# Patient Record
Sex: Male | Born: 1945
Health system: Southern US, Community
[De-identification: ages and names within clinical notes are randomized; demographics above are authoritative.]

## PROBLEM LIST (undated history)

## (undated) DIAGNOSIS — Z95810 Presence of automatic (implantable) cardiac defibrillator: Secondary | ICD-10-CM

## (undated) DIAGNOSIS — I472 Ventricular tachycardia, unspecified: Secondary | ICD-10-CM

## (undated) DIAGNOSIS — J449 Chronic obstructive pulmonary disease, unspecified: Secondary | ICD-10-CM

## (undated) DIAGNOSIS — J3089 Other allergic rhinitis: Secondary | ICD-10-CM

## (undated) DIAGNOSIS — I5022 Chronic systolic (congestive) heart failure: Secondary | ICD-10-CM

## (undated) DIAGNOSIS — I4891 Unspecified atrial fibrillation: Secondary | ICD-10-CM

## (undated) DIAGNOSIS — M5020 Other cervical disc displacement, unspecified cervical region: Secondary | ICD-10-CM

## (undated) DIAGNOSIS — G4700 Insomnia, unspecified: Secondary | ICD-10-CM

## (undated) DIAGNOSIS — M1990 Unspecified osteoarthritis, unspecified site: Secondary | ICD-10-CM

## (undated) DIAGNOSIS — K5790 Diverticulosis of intestine, part unspecified, without perforation or abscess without bleeding: Secondary | ICD-10-CM

## (undated) DIAGNOSIS — I252 Old myocardial infarction: Secondary | ICD-10-CM

## (undated) DIAGNOSIS — Z8719 Personal history of other diseases of the digestive system: Secondary | ICD-10-CM

## (undated) DIAGNOSIS — I2510 Atherosclerotic heart disease of native coronary artery without angina pectoris: Secondary | ICD-10-CM

## (undated) DIAGNOSIS — I255 Ischemic cardiomyopathy: Secondary | ICD-10-CM

## (undated) DIAGNOSIS — I10 Essential (primary) hypertension: Secondary | ICD-10-CM

## (undated) DIAGNOSIS — J189 Pneumonia, unspecified organism: Secondary | ICD-10-CM

## (undated) DIAGNOSIS — F419 Anxiety disorder, unspecified: Secondary | ICD-10-CM

## (undated) DIAGNOSIS — J309 Allergic rhinitis, unspecified: Secondary | ICD-10-CM

## (undated) HISTORY — DX: Anxiety disorder, unspecified: F41.9

## (undated) HISTORY — DX: Diverticulosis of intestine, part unspecified, without perforation or abscess without bleeding: K57.90

## (undated) HISTORY — PX: TONSILLECTOMY AND ADENOIDECTOMY: SUR1326

## (undated) HISTORY — DX: Chronic obstructive pulmonary disease, unspecified: J44.9

## (undated) HISTORY — DX: Other allergic rhinitis: J30.89

## (undated) HISTORY — DX: Unspecified atrial fibrillation: I48.91

## (undated) HISTORY — PX: SHOULDER ARTHROSCOPY W/ ROTATOR CUFF REPAIR: SHX2400

## (undated) HISTORY — DX: Other cervical disc displacement, unspecified cervical region: M50.20

## (undated) HISTORY — DX: Unspecified osteoarthritis, unspecified site: M19.90

## (undated) HISTORY — DX: Insomnia, unspecified: G47.00

## (undated) HISTORY — DX: Allergic rhinitis, unspecified: J30.9

---

## 1993-02-04 DIAGNOSIS — I252 Old myocardial infarction: Secondary | ICD-10-CM

## 1993-02-04 HISTORY — DX: Old myocardial infarction: I25.2

## 1993-02-04 HISTORY — PX: CORONARY ANGIOPLASTY: SHX604

## 1998-12-23 ENCOUNTER — Emergency Department (HOSPITAL_COMMUNITY): Admission: EM | Admit: 1998-12-23 | Discharge: 1998-12-23 | Payer: Self-pay | Admitting: Emergency Medicine

## 2001-04-01 ENCOUNTER — Encounter: Payer: Self-pay | Admitting: Orthopedic Surgery

## 2001-04-01 ENCOUNTER — Ambulatory Visit (HOSPITAL_COMMUNITY): Admission: RE | Admit: 2001-04-01 | Discharge: 2001-04-01 | Payer: Self-pay | Admitting: Orthopedic Surgery

## 2002-02-04 HISTORY — PX: INSERT / REPLACE / REMOVE PACEMAKER: SUR710

## 2002-02-04 HISTORY — PX: CARDIAC CATHETERIZATION: SHX172

## 2002-02-04 HISTORY — PX: CARDIAC DEFIBRILLATOR PLACEMENT: SHX171

## 2002-07-21 ENCOUNTER — Ambulatory Visit (HOSPITAL_COMMUNITY): Admission: RE | Admit: 2002-07-21 | Discharge: 2002-07-22 | Payer: Self-pay | Admitting: Cardiovascular Disease

## 2002-08-26 ENCOUNTER — Ambulatory Visit (HOSPITAL_COMMUNITY): Admission: RE | Admit: 2002-08-26 | Discharge: 2002-08-27 | Payer: Self-pay | Admitting: Internal Medicine

## 2002-08-27 ENCOUNTER — Encounter: Payer: Self-pay | Admitting: Internal Medicine

## 2003-05-06 ENCOUNTER — Ambulatory Visit (HOSPITAL_COMMUNITY): Admission: RE | Admit: 2003-05-06 | Discharge: 2003-05-06 | Payer: Self-pay | Admitting: Orthopedic Surgery

## 2003-05-06 HISTORY — PX: KNEE ARTHROSCOPY: SHX127

## 2003-12-19 ENCOUNTER — Encounter: Admission: RE | Admit: 2003-12-19 | Discharge: 2003-12-19 | Payer: Self-pay | Admitting: Specialist

## 2004-02-03 ENCOUNTER — Encounter: Admission: RE | Admit: 2004-02-03 | Discharge: 2004-02-03 | Payer: Self-pay | Admitting: Internal Medicine

## 2004-03-19 ENCOUNTER — Ambulatory Visit: Payer: Self-pay | Admitting: Internal Medicine

## 2004-04-20 ENCOUNTER — Inpatient Hospital Stay (HOSPITAL_COMMUNITY): Admission: RE | Admit: 2004-04-20 | Discharge: 2004-04-21 | Payer: Self-pay | Admitting: Specialist

## 2004-05-21 ENCOUNTER — Encounter: Admission: RE | Admit: 2004-05-21 | Discharge: 2004-05-21 | Payer: Self-pay | Admitting: Specialist

## 2004-05-28 ENCOUNTER — Encounter: Admission: RE | Admit: 2004-05-28 | Discharge: 2004-05-28 | Payer: Self-pay | Admitting: Specialist

## 2005-06-26 ENCOUNTER — Ambulatory Visit: Payer: Self-pay | Admitting: Internal Medicine

## 2006-08-12 ENCOUNTER — Ambulatory Visit: Payer: Self-pay | Admitting: Internal Medicine

## 2007-08-04 ENCOUNTER — Ambulatory Visit: Payer: Self-pay | Admitting: Internal Medicine

## 2007-11-05 ENCOUNTER — Ambulatory Visit: Payer: Self-pay | Admitting: Internal Medicine

## 2008-02-08 ENCOUNTER — Ambulatory Visit: Payer: Self-pay | Admitting: Internal Medicine

## 2008-05-03 ENCOUNTER — Encounter (INDEPENDENT_AMBULATORY_CARE_PROVIDER_SITE_OTHER): Payer: Self-pay | Admitting: *Deleted

## 2008-05-30 ENCOUNTER — Ambulatory Visit: Payer: Self-pay | Admitting: Internal Medicine

## 2008-05-30 ENCOUNTER — Telehealth (INDEPENDENT_AMBULATORY_CARE_PROVIDER_SITE_OTHER): Payer: Self-pay | Admitting: *Deleted

## 2008-06-14 ENCOUNTER — Encounter: Payer: Self-pay | Admitting: Internal Medicine

## 2008-06-20 ENCOUNTER — Ambulatory Visit: Payer: Self-pay | Admitting: Internal Medicine

## 2008-06-21 ENCOUNTER — Ambulatory Visit: Payer: Self-pay | Admitting: Family Medicine

## 2008-06-27 ENCOUNTER — Telehealth: Payer: Self-pay | Admitting: Family Medicine

## 2008-07-01 ENCOUNTER — Telehealth: Payer: Self-pay | Admitting: Internal Medicine

## 2008-07-13 ENCOUNTER — Encounter (INDEPENDENT_AMBULATORY_CARE_PROVIDER_SITE_OTHER): Payer: Self-pay | Admitting: *Deleted

## 2008-07-14 ENCOUNTER — Encounter: Payer: Self-pay | Admitting: Family Medicine

## 2008-07-15 ENCOUNTER — Telehealth: Payer: Self-pay | Admitting: Family Medicine

## 2008-07-15 ENCOUNTER — Ambulatory Visit: Payer: Self-pay | Admitting: Family Medicine

## 2008-07-18 ENCOUNTER — Telehealth: Payer: Self-pay | Admitting: Family Medicine

## 2008-07-28 ENCOUNTER — Ambulatory Visit: Payer: Self-pay | Admitting: Family Medicine

## 2008-07-28 ENCOUNTER — Telehealth: Payer: Self-pay | Admitting: Family Medicine

## 2008-07-29 LAB — CONVERTED CEMR LAB
CO2: 27 meq/L (ref 19–32)
Chloride: 106 meq/L (ref 96–112)
Potassium: 3.9 meq/L (ref 3.5–5.1)

## 2008-08-01 ENCOUNTER — Telehealth: Payer: Self-pay | Admitting: Internal Medicine

## 2008-08-01 ENCOUNTER — Telehealth: Payer: Self-pay | Admitting: Family Medicine

## 2008-08-02 ENCOUNTER — Ambulatory Visit: Payer: Self-pay | Admitting: Internal Medicine

## 2008-08-22 ENCOUNTER — Ambulatory Visit: Payer: Self-pay | Admitting: Internal Medicine

## 2008-08-25 ENCOUNTER — Ambulatory Visit: Payer: Self-pay | Admitting: Family Medicine

## 2008-08-25 DIAGNOSIS — J984 Other disorders of lung: Secondary | ICD-10-CM

## 2008-08-25 DIAGNOSIS — J309 Allergic rhinitis, unspecified: Secondary | ICD-10-CM | POA: Insufficient documentation

## 2008-08-25 DIAGNOSIS — T462X5A Adverse effect of other antidysrhythmic drugs, initial encounter: Secondary | ICD-10-CM

## 2008-08-25 DIAGNOSIS — E785 Hyperlipidemia, unspecified: Secondary | ICD-10-CM

## 2008-09-06 ENCOUNTER — Telehealth: Payer: Self-pay | Admitting: Family Medicine

## 2008-10-24 ENCOUNTER — Telehealth: Payer: Self-pay | Admitting: Family Medicine

## 2008-10-31 DIAGNOSIS — I2510 Atherosclerotic heart disease of native coronary artery without angina pectoris: Secondary | ICD-10-CM

## 2009-01-16 ENCOUNTER — Telehealth: Payer: Self-pay | Admitting: Family Medicine

## 2009-02-06 ENCOUNTER — Telehealth: Payer: Self-pay | Admitting: Family Medicine

## 2009-02-13 ENCOUNTER — Telehealth: Payer: Self-pay | Admitting: Family Medicine

## 2009-02-16 ENCOUNTER — Encounter: Payer: Self-pay | Admitting: Family Medicine

## 2009-02-21 ENCOUNTER — Telehealth: Payer: Self-pay | Admitting: Family Medicine

## 2009-02-23 ENCOUNTER — Encounter: Payer: Self-pay | Admitting: Family Medicine

## 2009-04-28 ENCOUNTER — Telehealth: Payer: Self-pay | Admitting: Family Medicine

## 2009-06-05 ENCOUNTER — Telehealth: Payer: Self-pay | Admitting: Family Medicine

## 2009-06-15 ENCOUNTER — Telehealth: Payer: Self-pay | Admitting: Family Medicine

## 2009-07-06 ENCOUNTER — Ambulatory Visit: Payer: Self-pay | Admitting: Family Medicine

## 2009-07-14 ENCOUNTER — Telehealth: Payer: Self-pay | Admitting: Family Medicine

## 2009-07-21 ENCOUNTER — Telehealth: Payer: Self-pay | Admitting: Family Medicine

## 2009-08-16 ENCOUNTER — Ambulatory Visit: Payer: Self-pay | Admitting: Family Medicine

## 2009-08-22 ENCOUNTER — Telehealth: Payer: Self-pay | Admitting: Family Medicine

## 2009-08-23 ENCOUNTER — Telehealth: Payer: Self-pay | Admitting: Family Medicine

## 2009-08-23 ENCOUNTER — Ambulatory Visit: Payer: Self-pay | Admitting: Internal Medicine

## 2009-08-23 ENCOUNTER — Ambulatory Visit: Payer: Self-pay | Admitting: Family Medicine

## 2009-08-23 DIAGNOSIS — Z95810 Presence of automatic (implantable) cardiac defibrillator: Secondary | ICD-10-CM | POA: Insufficient documentation

## 2009-08-24 ENCOUNTER — Encounter (INDEPENDENT_AMBULATORY_CARE_PROVIDER_SITE_OTHER): Payer: Self-pay | Admitting: *Deleted

## 2009-08-24 LAB — CONVERTED CEMR LAB
AST: 15 units/L (ref 0–37)
Albumin: 3.6 g/dL (ref 3.5–5.2)
BUN: 16 mg/dL (ref 6–23)
Basophils Absolute: 0.1 10*3/uL (ref 0.0–0.1)
CO2: 27 meq/L (ref 19–32)
Cholesterol: 183 mg/dL (ref 0–200)
Eosinophils Absolute: 0.3 10*3/uL (ref 0.0–0.7)
Glucose, Bld: 74 mg/dL (ref 70–99)
HCT: 41 % (ref 39.0–52.0)
Hemoglobin: 13.8 g/dL (ref 13.0–17.0)
Lymphs Abs: 2.3 10*3/uL (ref 0.7–4.0)
MCHC: 33.5 g/dL (ref 30.0–36.0)
MCV: 90.1 fL (ref 78.0–100.0)
Monocytes Absolute: 0.6 10*3/uL (ref 0.1–1.0)
Monocytes Relative: 7.1 % (ref 3.0–12.0)
Neutro Abs: 5.5 10*3/uL (ref 1.4–7.7)
Platelets: 260 10*3/uL (ref 150.0–400.0)
Potassium: 3.8 meq/L (ref 3.5–5.1)
RDW: 14.7 % — ABNORMAL HIGH (ref 11.5–14.6)
Sodium: 138 meq/L (ref 135–145)
Triglycerides: 228 mg/dL — ABNORMAL HIGH (ref 0.0–149.0)
VLDL: 45.6 mg/dL — ABNORMAL HIGH (ref 0.0–40.0)

## 2009-08-25 ENCOUNTER — Telehealth: Payer: Self-pay | Admitting: Family Medicine

## 2009-08-30 ENCOUNTER — Telehealth: Payer: Self-pay | Admitting: Internal Medicine

## 2009-08-31 ENCOUNTER — Ambulatory Visit: Payer: Self-pay | Admitting: Family Medicine

## 2009-09-11 ENCOUNTER — Ambulatory Visit: Payer: Self-pay | Admitting: Family Medicine

## 2009-09-13 ENCOUNTER — Telehealth: Payer: Self-pay | Admitting: Family Medicine

## 2009-09-18 ENCOUNTER — Telehealth: Payer: Self-pay | Admitting: Family Medicine

## 2009-10-16 ENCOUNTER — Telehealth: Payer: Self-pay | Admitting: Family Medicine

## 2009-11-20 ENCOUNTER — Telehealth: Payer: Self-pay | Admitting: Family Medicine

## 2009-11-30 ENCOUNTER — Ambulatory Visit: Payer: Self-pay | Admitting: Family Medicine

## 2009-12-01 ENCOUNTER — Encounter: Payer: Self-pay | Admitting: Internal Medicine

## 2009-12-12 ENCOUNTER — Encounter: Payer: Self-pay | Admitting: Family Medicine

## 2009-12-14 ENCOUNTER — Telehealth: Payer: Self-pay | Admitting: Family Medicine

## 2009-12-18 ENCOUNTER — Encounter: Payer: Self-pay | Admitting: Family Medicine

## 2009-12-19 ENCOUNTER — Ambulatory Visit: Payer: Self-pay | Admitting: Family Medicine

## 2009-12-25 ENCOUNTER — Telehealth: Payer: Self-pay | Admitting: Family Medicine

## 2010-01-04 ENCOUNTER — Ambulatory Visit: Payer: Self-pay | Admitting: Family Medicine

## 2010-01-08 ENCOUNTER — Encounter: Admission: RE | Admit: 2010-01-08 | Discharge: 2010-01-08 | Payer: Self-pay | Admitting: Family Medicine

## 2010-01-11 ENCOUNTER — Encounter: Payer: Self-pay | Admitting: Internal Medicine

## 2010-01-11 ENCOUNTER — Encounter: Payer: Self-pay | Admitting: Family Medicine

## 2010-01-22 ENCOUNTER — Ambulatory Visit: Payer: Self-pay | Admitting: Family Medicine

## 2010-01-23 ENCOUNTER — Telehealth (INDEPENDENT_AMBULATORY_CARE_PROVIDER_SITE_OTHER): Payer: Self-pay | Admitting: *Deleted

## 2010-01-24 ENCOUNTER — Ambulatory Visit: Payer: Self-pay | Admitting: Family Medicine

## 2010-01-24 ENCOUNTER — Telehealth (INDEPENDENT_AMBULATORY_CARE_PROVIDER_SITE_OTHER): Payer: Self-pay | Admitting: *Deleted

## 2010-01-25 ENCOUNTER — Ambulatory Visit: Payer: Self-pay | Admitting: Family Medicine

## 2010-01-25 DIAGNOSIS — R300 Dysuria: Secondary | ICD-10-CM | POA: Insufficient documentation

## 2010-01-25 DIAGNOSIS — R310 Gross hematuria: Secondary | ICD-10-CM

## 2010-01-25 LAB — CONVERTED CEMR LAB
Glucose, Urine, Semiquant: NEGATIVE
Protein, U semiquant: 30
Urobilinogen, UA: 0.2
WBC Urine, dipstick: NEGATIVE

## 2010-01-30 ENCOUNTER — Telehealth: Payer: Self-pay | Admitting: Family Medicine

## 2010-02-04 HISTORY — PX: LAPAROSCOPIC CHOLECYSTECTOMY: SUR755

## 2010-02-06 ENCOUNTER — Encounter (INDEPENDENT_AMBULATORY_CARE_PROVIDER_SITE_OTHER): Payer: Self-pay | Admitting: *Deleted

## 2010-02-07 ENCOUNTER — Encounter: Payer: Self-pay | Admitting: Internal Medicine

## 2010-02-07 ENCOUNTER — Ambulatory Visit
Admission: RE | Admit: 2010-02-07 | Discharge: 2010-02-07 | Payer: Self-pay | Source: Home / Self Care | Attending: Internal Medicine | Admitting: Internal Medicine

## 2010-02-08 ENCOUNTER — Encounter: Payer: Self-pay | Admitting: Internal Medicine

## 2010-02-09 ENCOUNTER — Encounter: Payer: Self-pay | Admitting: Internal Medicine

## 2010-02-09 ENCOUNTER — Ambulatory Visit
Admission: RE | Admit: 2010-02-09 | Discharge: 2010-02-09 | Payer: Self-pay | Source: Home / Self Care | Attending: Internal Medicine | Admitting: Internal Medicine

## 2010-02-09 ENCOUNTER — Telehealth (INDEPENDENT_AMBULATORY_CARE_PROVIDER_SITE_OTHER): Payer: Self-pay | Admitting: *Deleted

## 2010-02-14 ENCOUNTER — Encounter (INDEPENDENT_AMBULATORY_CARE_PROVIDER_SITE_OTHER): Payer: Self-pay | Admitting: *Deleted

## 2010-02-21 ENCOUNTER — Ambulatory Visit (HOSPITAL_COMMUNITY)
Admission: RE | Admit: 2010-02-21 | Discharge: 2010-02-21 | Payer: Self-pay | Source: Home / Self Care | Attending: Surgery | Admitting: Surgery

## 2010-02-21 LAB — COMPREHENSIVE METABOLIC PANEL
ALT: 8 U/L (ref 0–53)
AST: 19 U/L (ref 0–37)
Albumin: 3.4 g/dL — ABNORMAL LOW (ref 3.5–5.2)
Alkaline Phosphatase: 59 U/L (ref 39–117)
BUN: 14 mg/dL (ref 6–23)
CO2: 27 mEq/L (ref 19–32)
Calcium: 9.2 mg/dL (ref 8.4–10.5)
Chloride: 110 mEq/L (ref 96–112)
Creatinine, Ser: 1.06 mg/dL (ref 0.4–1.5)
GFR calc Af Amer: >60 mL/min (ref >60)
GFR calc non Af Amer: >60 mL/min (ref >60)
Glucose, Bld: 93 mg/dL (ref 70–99)
Potassium: 4.8 mEq/L (ref 3.5–5.1)
Sodium: 145 mEq/L (ref 135–145)
Total Bilirubin: 0.5 mg/dL (ref 0.3–1.2)
Total Protein: 5.9 g/dL — ABNORMAL LOW (ref 6.0–8.3)

## 2010-02-21 LAB — SURGICAL PCR SCREEN
MRSA, PCR: NEGATIVE
Staphylococcus aureus: NEGATIVE

## 2010-02-21 LAB — CBC
HCT: 37.6 % — ABNORMAL LOW (ref 39.0–52.0)
Hemoglobin: 12.4 g/dL — ABNORMAL LOW (ref 13.0–17.0)
MCH: 29.8 pg (ref 26.0–34.0)
MCHC: 33 g/dL (ref 30.0–36.0)
MCV: 90.4 fL (ref 78.0–100.0)
Platelets: 295 10*3/uL (ref 150–400)
RBC: 4.16 MIL/uL — ABNORMAL LOW (ref 4.22–5.81)
RDW: 14.9 % (ref 11.5–15.5)
WBC: 10.3 10*3/uL (ref 4.0–10.5)

## 2010-02-23 NOTE — Op Note (Signed)
  [name], [name]              ACCOUNT NO.:  [id number]  MEDICAL RECORD NO.:  [id number]          PATIENT TYPE:  AMB  LOCATION:  SDS                          FACILITY:  [facility]  PHYSICIAN:  Abigail Miyamoto, M.D. DATE OF BIRTH:  Mar 14, 1945  DATE OF PROCEDURE:  02/21/2010 DATE OF DISCHARGE:  02/21/2010                              OPERATIVE REPORT   DATE OF PROCEDURE:  February 21, 2010.  PREOPERATIVE DIAGNOSIS:  Symptomatic cholelithiasis.  POSTOPERATIVE DIAGNOSIS:  Symptomatic cholelithiasis.  PROCEDURE:  Laparoscopic cholecystectomy.  SURGEON:  Abigail Miyamoto, MD  ANESTHESIA:  General and 0.5% Marcaine.  ESTIMATED BLOOD LOSS:  Minimal.  FINDINGS:  The patient was found to have chronically scarred-appearing gallbladder with gallstones.  PROCEDURE IN DETAIL:  The patient was brought to the operating room and identified as [name].  He was placed supine on the operating table and general anesthesia was induced.  His abdomen was prepped and draped in the usual sterile fashion.  Using a 15 blade, a small vertical incision was made below the umbilicus.  This was carried down to the fascia which was then opened with a scalpel.  A hemostat was then used to pass through the peritoneal cavity under direct vision.  Next, a 0- Vicryl pursestring suture was placed around the fascial opening.  The Hasson port was placed through the opening and insufflation of the abdomen was begun.  A 5-mm port was then placed in the patient's epigastrium and two more were placed in the right upper quadrant, all under direct vision.  The gallbladder was then grasped and tracked above the liver bed.  Several adhesions to the gallbladder were then taken down bluntly.  The cystic duct was then dissected out and a critical window was achieved around.  It was clipped three times proximally, once distally, and then transected.  The cystic artery was then identified and clipped three times  proximally and once distally and transected as well.  The gallbladder was then slowly dissected free from liver bed with electrocautery.  Once this was free from liver bed, hemostasis was achieved with the cautery.  The gallbladder was then removed through the incision at the umbilicus.  I then irrigated the abdomen with normal saline.  With 0-Vicryl umbilicus was then tied in place closing the fascial defect.  All ports were removed under direct vision, and the abdomen was deflated.  All incisions were anesthetized with Marcaine and closed with 4-0 Monocryl subcuticular sutures.  Steri-Strips and Band-Aids were then applied.  The patient tolerated the procedure well.  All sponge, needle, and instrument counts were correct at the end of the procedure.  The patient was then extubated in the operating room and taken in stable condition to recovery room.     Abigail Miyamoto, M.D.     DB/MEDQ  D:  02/21/2010  T:  02/22/2010  Job:  [id number]  Electronically Signed by Abigail Miyamoto M.D. on 02/23/2010 11:03:42 AM

## 2010-02-25 ENCOUNTER — Encounter: Payer: Self-pay | Admitting: Specialist

## 2010-02-26 ENCOUNTER — Encounter: Payer: Self-pay | Admitting: Internal Medicine

## 2010-02-28 ENCOUNTER — Telehealth (INDEPENDENT_AMBULATORY_CARE_PROVIDER_SITE_OTHER): Payer: Self-pay | Admitting: *Deleted

## 2010-03-08 NOTE — Progress Notes (Signed)
Summary: Rx Ambien  Phone Note Refill Request Call back at [phone] Message [facility]:  [facility] on Jun 05, 2009 8:13 AM  Refills Requested: Medication #1:  ZOLPIDEM TARTRATE 10 MG TABS 1 by mouth at bedtime as needed insomnia   Last Refilled: 05/02/2009 Received faxed refill request please advise.   Method Requested: Telephone to Pharmacy Initial call taken by: Linde Gillis CMA Duncan Dull),  Jun 05, 2009 8:13 AM  Follow-up for Phone Call        [facility] request Follow-up by: Benny Lennert CMA Duncan Dull),  Jun 05, 2009 9:16 AM    Prescriptions: ZOLPIDEM TARTRATE 10 MG TABS (ZOLPIDEM TARTRATE) 1 by mouth at bedtime as needed insomnia  #30 x 4   Entered and Authorized by:   Hannah Beat MD   Signed by:   Hannah Beat MD on 06/05/2009   Method used:   Telephoned to ...       [facility] [address] (retail)       [phone]       [place], [place]  [address]       Ph: [phone]       Fax: [phone]   RxID:   [id number]

## 2010-03-08 NOTE — Progress Notes (Signed)
Summary: refill request for vicodin  Phone Note Refill Request Message from:  Fax from Pharmacy  Refills Requested: Medication #1:  HYDROCODONE-ACETAMINOPHEN 5-500 MG TABS Take 1 tab four times a day as needed for pain   Last Refilled: 08/23/2009 [facility]axed request from [facility] [address], [phone].  Initial call taken by: Lowella Petties CMA,  September 13, 2009 10:02 AM  Follow-up for Phone Call        Rx called to pharmacy Follow-up by: Benny Lennert CMA Duncan Dull),  September 13, 2009 11:48 AM    Prescriptions: HYDROCODONE-ACETAMINOPHEN 5-500 MG TABS (HYDROCODONE-ACETAMINOPHEN) Take 1 tab four times a day as needed for pain  #50 x 3   Entered and Authorized by:   Hannah Beat MD   Signed by:   Hannah Beat MD on 09/13/2009   Method used:   Telephoned to ...       [facility]  [place] (retail)       [address]       [place], [place]  [address]       Ph: [phone] or [phone]       Fax: [phone]   RxID:   [id number]

## 2010-03-08 NOTE — Progress Notes (Signed)
Summary: refill request for tramadol  Phone Note Refill Request Message from:  Fax from Pharmacy  Refills Requested: Medication #1:  TRAMADOL HCL 50 MG TABS TAke 1 tab four times a day as needed for pain   Last Refilled: 06/15/2009 Faxed request from [facility] [address], [phone].   Initial call taken by: Lowella Petties CMA,  August 25, 2009 8:25 AM  Follow-up for Phone Call        sent to wrong pharm yest Follow-up by: Hannah Beat MD,  August 25, 2009 8:28 AM    Prescriptions: TRAMADOL HCL 50 MG TABS (TRAMADOL HCL) TAke 1 tab four times a day as needed for pain  #60 x 5   Entered and Authorized by:   Hannah Beat MD   Signed by:   Hannah Beat MD on 08/25/2009   Method used:   [facility]        [facility]  [place] (retail)       [address]       [place], [place]  [address]       Ph: [phone] or [phone]       Fax: [phone]   RxID:   [id number]

## 2010-03-08 NOTE — Progress Notes (Signed)
Summary: wants antibiotic cream for his nose  Phone Note Call from Patient Call back at [phone]   Caller: Patient Call For: Hannah Beat MD Summary of Call: Pt was seen last week for allergic reaction.  He says he has sores in his nose that are draining.  He was told to use neosporin but that isnt helping.  He is asking for an antibiotic or steroid cream to [facility].  He says this is causing him a lot of problems and he needs to get this cleared up. Initial call taken by: Lowella Petties CMA,  August 22, 2009 8:30 AM  Follow-up for Phone Call        noted  mupiricin ointment, 4 times daily to each nostril, 1 month supply tube, #1, 0 refills Follow-up by: Hannah Beat MD,  August 22, 2009 8:39 AM  Additional Follow-up for Phone Call Additional follow up Details #1::        Phone Call Completed Additional Follow-up by: Benny Lennert CMA Duncan Dull),  August 22, 2009 8:47 AM    New/Updated Medications: MUPIROCIN 2 % OINT (MUPIROCIN) apply 4 times daily to effected area Prescriptions: MUPIROCIN 2 % OINT (MUPIROCIN) apply 4 times daily to effected area  #1 x 0   Entered by:   Benny Lennert CMA (AAMA)   Authorized by:   Hannah Beat MD   Signed by:   Benny Lennert CMA (AAMA) on 08/22/2009   Method used:   [facility]        [facility] (retail)       [address]       [place], [place]  [address]       Ph: [phone] or [phone]       Fax: [phone]   RxID:   [id number]

## 2010-03-08 NOTE — Assessment & Plan Note (Signed)
Summary: reaction from perfume/dlo   Vital Signs:  Patient profile:   65 year old male Height:      63 inches Weight:      170.8 pounds BMI:     30.37 Temp:     98.1 degrees F oral Pulse rate:   70 / minute Pulse rhythm:   regular BP sitting:   130 / 80  (left arm) Cuff size:   regular  Vitals Entered By: Benny Lennert CMA Duncan Dull) (August 31, 2009 12:09 PM)  History of Present Illness: Chief complaint reaction from perfume  65 year old male:  Went to eat at [facility] and came bak over and the woman came by and feels like her sinuses  he has had recurrent problems perfumes in the past, and some of them will  break out his nose,  start a series of sinus congestion and making cough and had to use an inhaler.  He also got myopathies with his statin medication and would like to stop this. He is interested about maximizing his risk of decreasing his risk for post  secondary MIs and a Faylene Million, although he is  unable to tolerate or take a statin medication. He has multiple questions about this.  Allergies: 1)  ! Codeine 2)  ! Penicillin 3)  ! * Statin Intolerance  Past History:  Past medical, surgical, family and social histories (including risk factors) reviewed, and no changes noted (except as noted below).  Past Medical History: MI/ 1995 (ICD-410.90) VENTRICULAR TACHYCARDIA (ICD-427.1) ATRIAL FIBRILLATION WITH RAPID VENTRICULAR RESPONSE (ICD-427.31) PACEMAKER, PERMANENT (ICD-V45.01) CHF/ CLASS I-II (ICD-428.0)  (26% EF), has AICD CAD, UNSPECIFIED SITE (ICD-414.00) CARDIOMYOPATHY, ISCHEMIC (ICD-414.8) HYPERLIPIDEMIAConnie intolerance to statins. Myopathies. COPD, MILD (ICD-496) DYSPHAGIA (ICD-787.29) ALLERGIC RHINITIS (ICD-477.9)   Cards = Sharrell Ku GI = Leone Payor  Past Surgical History: Reviewed history from 10/31/2008 and no changes required. Defibrillator-AICD (Medtronic) PTCA-Stent 1998 RTC repair, R Spirometry: 08/25/2008: FEV1% = 64, FEV1 = 89 % predicted  Family  History: Reviewed history from 08/22/2008 and no changes required. Family History of Diabetes: Father No FH of Colon Cancer:  Social History: Reviewed history from 06/21/2008 and no changes required. Occupation: Retired, Presenter, broadcasting Patient currently smokes. (150 + PACK YEARS) Alcohol Use - no Daily Caffeine Use -1 Illicit Drug Use - no Married, no children  Review of Systems      See HPI General:  Denies chills and fever. CV:  Denies swelling of feet and swelling of hands. Resp:  Complains of cough, shortness of breath, and sputum productive.  Physical Exam  General:  Well-developed,well-nourished,in no acute distress; alert,appropriate and cooperative throughout examination Head:  Normocephalic and atraumatic without obvious abnormalities. No apparent alopecia or balding. Ears:  no external deformities.   Nose:  no external deformity.   Mouth:  Oral mucosa and oropharynx without lesions or exudates.  Teeth in good repair. Neck:  No deformities, masses, or tenderness noted. Lungs:  normal respiratory effort, no intercostal retractions, and no accessory muscle use.   some decreased BS with rare wheezing Heart:  Regular rate and rhythm; no murmurs, rubs,  or bruits. Extremities:  No clubbing, cyanosis, edema, or deformity noted with normal full range of motion of all joints.   Neurologic:  alert & oriented X3 and gait normal.   Psych:  Cognition and judgment appear intact. Alert and cooperative with normal attention span and concentration. No apparent delusions, illusions, hallucinations   Impression & Recommendations:  Problem # 1:  HYPERLIPIDEMIA (ICD-272.4) like to  stop his statin medication. We discussed some of the research and potential benefit and post MI patient's taking niacin,, fainted this is very reasonable. We'll start Niaspan slowly and then recheck after several months.  The following medications were removed from the medication list:    Crestor 5 Mg Tabs  (Rosuvastatin calcium) .[name]... Take one tablet daily His updated medication list for this problem includes:    Zetia 10 Mg Tabs (Ezetimibe) ..... Once daily    Niaspan 500 Mg Cr-tabs (Niacin (antihyperlipidemic)) .[name]... 1 by mouth at bedtime    Niaspan 1000 Mg Cr-tabs (Niacin (antihyperlipidemic)) .[name]... 1 by mouth at bedtime  Problem # 2:  MI/ 1995 (ICD-410.90)  His updated medication list for this problem includes:    Carvedilol 12.5 Mg Tabs (Carvedilol) .[name]... Take 1 tab two times a day    Aspirin Ec 325 Mg Tbec (Aspirin) ..... Once daily    Lisinopril 10 Mg Tabs (Lisinopril) ..... Once daily    Nitrostat 0.4 Mg Subl (Nitroglycerin) .[name]... 1 by mouth as needed as direcyed    Cozaar 100 Mg Tabs (Losartan potassium) .[name]..[name] Two times a day  Problem # 3:  ATRIAL FIBRILLATION WITH RAPID VENTRICULAR RESPONSE (ICD-427.31)  His updated medication list for this problem includes:    Carvedilol 12.5 Mg Tabs (Carvedilol) .[name]... Take 1 tab two times a day    Aspirin Ec 325 Mg Tbec (Aspirin) ..... Once daily  Problem # 4:  COPD, MILD (ICD-496) thank you this for some of his cough is coming from, but he is opposed to going on any more  daily medication at this point but  His updated medication list for this problem includes:    Proair Hfa 108 (90 Base) Mcg/act Aers (Albuterol sulfate) .[name]... 2 inh q4h as needed shortness of breath  Complete Medication List: 1)  Clotrimazole 10 Mg Troc (Clotrimazole) .... As needed 2)  Celebrex 200 Mg Caps (Celecoxib) .... Take 1 capsule as needed for pain 3)  Carvedilol 12.5 Mg Tabs (Carvedilol) .... Take 1 tab two times a day 4)  Aspirin Ec 325 Mg Tbec (Aspirin) .... Once daily 5)  Lisinopril 10 Mg Tabs (Lisinopril) .... Once daily 6)  Hydrocodone-acetaminophen 5-500 Mg Tabs (Hydrocodone-acetaminophen) .... Take 1 tab four times a day as needed for pain 7)  Benzonatate 200 Mg Caps (Benzonatate) .... Take 1 tab three times a day as needed 8)  Nitrostat 0.4 Mg Subl  (Nitroglycerin) .[name].. 1 by mouth as needed as direcyed 9)  Zetia 10 Mg Tabs (Ezetimibe) .... Once daily 10)  Cozaar 100 Mg Tabs (Losartan potassium) .... Two times a day 11)  Diazepam 5 Mg Tabs (Diazepam) .... Take 1 tab up to three times a day daily as needed 12)  Skelaxin 800 Mg Tabs (Metaxalone) .[name].. 1 by mouth three times a day as needed muscle spasm / back pain 13)  Tramadol Hcl 50 Mg Tabs (Tramadol hcl) .... Take 1 tab four times a day as needed for pain 14)  Zolpidem Tartrate 10 Mg Tabs (Zolpidem tartrate) .[name].. 1 by mouth at bedtime as needed insomnia 15)  Promethazine Hcl 25 Mg Tabs (Promethazine hcl) .... As needed for nausea 16)  Proair Hfa 108 (90 Base) Mcg/act Aers (Albuterol sulfate) .... 2 inh q4h as needed shortness of breath 17)  Niaspan 500 Mg Cr-tabs (Niacin (antihyperlipidemic)) .[name].. 1 by mouth at bedtime 18)  Niaspan 1000 Mg Cr-tabs (Niacin (antihyperlipidemic)) .[name].. 1 by mouth at bedtime  Patient Instructions: 1)  f/u 3 month 2)  FLP: 272.4 3)  HFP: v58.69 4)  TAKE NIACIN 500 MG 1 TABLET A DAY AT NIGHT BEFORE SLEEPING. TAKE YOUR ASPIRIN BEFORE THIS. 5)  AFTER , INCREASE TO THE 1000 MG PRESCRIPTION TAKEN AT NIGHT, THEN RECHECK LABS Prescriptions: NIASPAN 500 MG CR-TABS (NIACIN (ANTIHYPERLIPIDEMIC)) 1 by mouth at bedtime  #30 x 0   Entered and Authorized by:   Hannah Beat MD   Signed by:   Hannah Beat MD on 08/31/2009   Method used:   [facility]        [facility] ([facility]ail)       [address]       [place], [place]  [address]       Ph: [phone] or [phone]       Fax: [phone]   RxID:   [id number] NIASPAN 1000 MG CR-TABS (NIACIN (ANTIHYPERLIPIDEMIC)) 1 by mouth at bedtime  #30 x 3   Entered and Authorized by:   Hannah Beat MD   Signed by:   Hannah Beat MD on 08/31/2009   Method used:   Print then Give to Patient   RxID:   [id number] NIASPAN 500 MG CR-TABS (NIACIN (ANTIHYPERLIPIDEMIC)) 1 by mouth at  bedtime  #30 x 0   Entered and Authorized by:   Hannah Beat MD   Signed by:   Hannah Beat MD on 08/31/2009   Method used:   Electronically to        [facility] [address] (retail)       [phone]       [place], [place]  [address]       Ph: [phone]       Fax: [phone]   RxID:   [id number] PROAIR HFA 108 (90 BASE) MCG/ACT  AERS (ALBUTEROL SULFATE) 2 inh q4h as needed shortness of breath  #1 x 2   Entered and Authorized by:   Hannah Beat MD   Signed by:   Hannah Beat MD on 08/31/2009   Method used:   Print then Give to Patient   RxID:   [id number]   Current Allergies (reviewed today): ! CODEINE ! PENICILLIN ! * STATIN INTOLERANCE

## 2010-03-08 NOTE — Letter (Signed)
Summary: Remote Device Check  [facility], Main Office  [address]   [place], [place] [address]   Phone: [phone]  Fax: [phone]     February 14, 2010 MRN: [id number]   [name] [address] [place], [place]  [address]   Dear [name],   Your remote transmission was recieved and reviewed by your physician.  All diagnostics were within normal limits for you.   __X____Your next office visit is scheduled for:  April 2012 with Dr [name]. Please call our office to schedule an appointment.    Sincerely,  Vella Kohler

## 2010-03-08 NOTE — Consult Note (Signed)
Summary: [facility]  [facility]   Imported By: Lanelle Bal 01/20/2010 09:59:06  _____________________________________________________________________  External Attachment:    Type:   Image     Comment:   External Document  Appended Document: [facility] vasomotor rhinitis and prob reversible _ Gene [place]

## 2010-03-08 NOTE — Progress Notes (Signed)
Summary: [name]  Phone Note Refill Request Message from:  Fax from Pharmacy on February 28, 2010 3:29 PM  Refills Requested: Medication #1:  ZOLPIDEM TARTRATE 10 MG TABS 1 by mouth at bedtime as needed insomnia [facility] [phone]  Initial call taken by: Melody Comas,  February 28, 2010 3:31 PM  Follow-up for Phone Call        Rx called to pharmacy Follow-up by: Benny Lennert CMA Duncan Dull),  March 01, 2010 8:46 AM    Prescriptions: ZOLPIDEM TARTRATE 10 MG TABS (ZOLPIDEM TARTRATE) 1 by mouth at bedtime as needed insomnia  #30 x 4   Entered and Authorized by:   Hannah Beat MD   Signed by:   Hannah Beat MD on 02/28/2010   Method used:   Telephoned to ...       [facility]  [place] (retail)       [address]       [place], [place]  [address]       Ph: [phone] or [phone]       Fax: [phone]   RxID:   [id number]

## 2010-03-08 NOTE — Progress Notes (Signed)
Summary: pt needs asap  Phone Note Refill Request Message from:  [facility] [place] [facility] [phone]  Refills Requested: Medication #1:  NITROGLYCERIN 0.4 MG/HR PT24 as needed Initial call taken by: Omer Jack,  August 30, 2009 3:54 PM  Follow-up for Phone Call        RX SENT IN ON 08/23/09. LMOM FOR PT TO CALL BACK TO CONFIRM PILLS/PATCHES. Marrion Coy, CNA  August 30, 2009 4:00 PM  Follow-up by: Marrion Coy, CNA,  August 30, 2009 4:00 PM  Additional Follow-up for Phone Call Additional follow up Details #1::        pt states he takes pills, will send in new rx Marrion Coy, CNA  August 30, 2009 4:04 PM  Additional Follow-up by: Marrion Coy, CNA,  August 30, 2009 4:04 PM    New/Updated Medications: NITROSTAT 0.4 MG SUBL (NITROGLYCERIN) 1 by mouth as needed as direcyed Prescriptions: NITROSTAT 0.4 MG SUBL (NITROGLYCERIN) 1 by mouth as needed as direcyed  #25 x 6   Entered by:   Marrion Coy, CNA   Authorized by:   Laren Boom, MD, [facility]   Signed by:   Marrion Coy, CNA on 08/30/2009   Method used:   Electronically to        [facility]  [place] (retail)       [address]       [place], [place]  [address]       Ph: [phone] or [phone]       Fax: [phone]   RxID:   [id number]

## 2010-03-08 NOTE — Letter (Signed)
Summary: ok for surgery  [facility], Main Office  [address]   [place], [place] [address]   Phone: [phone]  Fax: [phone]    02/07/2010  TO: Leodis Sias IT MAY CONCERN   RE: [name] [address] [place]   The above named individual is under my medical care and may proceed with surgery.  He is at low cardiac risk.  He has a defibrillator and may have a magnet placed over his device during surgery and removed after.  If you have any further questions or need additional information, please call.     Sincerely,    Dr. Lewayne Bunting, MD,FACC Dennis Bast, RN, BSN

## 2010-03-08 NOTE — Progress Notes (Signed)
Summary: wants refill on prednisone  Phone Note Call from Patient Call back at Home Phone [phone]   Caller: Patient Summary of Call: Pt states he has an allergy to perfume and Dr. Patsy Lager gave him prednisone last time.  He is asking for a refill on that [facility].  He also wants a referral to a respiratory specialist to see if he can have help with this problem.  He prefers to see someone in [place]. Initial call taken by: Lowella Petties CMA,  November 20, 2009 8:09 AM  Follow-up for Phone Call        I will refill prednsione x1 , but for referral will await return of Dr. Patsy Lager and his recommendations. Tell pt please  and call prescription to pharm of choice. Follow-up by: Kerby Nora MD,  November 20, 2009 9:36 AM  Additional Follow-up for Phone Call Additional follow up Details #1::        patient advised and rx sent to phramacy.Consuello Masse CMA      Prescriptions: PREDNISONE 20 MG TABS (PREDNISONE) 2 by mouth x 5 days, then 1 by mouth x 3 days  #13 x 0   Entered by:   Benny Lennert CMA (AAMA)   Authorized by:   Kerby Nora MD   Signed by:   Benny Lennert CMA (AAMA) on 11/20/2009   Method used:   [facility]        [facility]  [place] [facility] ([facility]ail)       [address]       [place], [place]  [address]       Ph: [phone] or [phone]       Fax: [phone]   RxID:   [id number] PREDNISONE 20 MG TABS (PREDNISONE) 2 by mouth x 5 days, then 1 by mouth x 3 days  #13 x 0   Entered and Authorized by:   Kerby Nora MD   Signed by:   Kerby Nora MD on 11/20/2009   Method used:   Telephoned to ...       [facility] [address] (retail)       [phone]       [place], [place]  [address]       Ph: [phone]       Fax: [phone]   RxID:   [id number]

## 2010-03-08 NOTE — Assessment & Plan Note (Signed)
Summary: PAIN/DLO   Vital Signs:  Patient profile:   65 year old male Height:      63 inches Weight:      172.75 pounds BMI:     30.71 O2 Sat:      96 % on Room air Temp:     97.9 degrees F oral Pulse rate:   88 / minute Pulse rhythm:   regular Resp:     16 per minute BP sitting:   162 / 100  (left arm) Cuff size:   large  Vitals Entered By: Delilah Shan CMA Duncan Dull) (December 19, 2009 2:56 PM)  O2 Flow:  Room air CC: Pain in rib area from coughing, congestion.   History of Present Illness: No help for nasal sx with astepro.    Exposure to certain scents--> drainage, burning, sore throat.  Lower posterior bilateral rib pain, started last week with cough.  Coughing, minimal sputum that is clear.  Inc in wheeze.  Voice change noted by patient.  No fevers.  Tramadol/hydrocodone used for pain with some relief.    Allergies: 1)  ! Codeine 2)  ! Penicillin 3)  ! * Statin Intolerance  Review of Systems       See HPI.  Otherwise negative.    Physical Exam  General:  GEN: nad, alert and oriented HEENT: mucous membranes moist NECK: supple w/o LA CV: rrr.  PULM: ctab, no inc wob but posterior lower ribs tender to palpation and with cough, tenderness decreased with compression during cough ABD: soft, +bs EXT: no edema SKIN: no acute rash    Impression & Recommendations:  Problem # 1:  RIB PAIN (ICD-786.50) Likey an allergic source with cough that led to rib pain.  Rib pain better with compression.  At this point, I see no indication for antibiotics.  I would increase the prednisone and then see if that improves the cough (and subsequent pain).  Fu as needed.  he agrees.   Complete Medication List: 1)  Clotrimazole 10 Mg Troc (Clotrimazole) .... As needed 2)  Celebrex 200 Mg Caps (Celecoxib) .... Take 1 capsule as needed for pain 3)  Carvedilol 12.5 Mg Tabs (Carvedilol) .... Take 1 tab two times a day 4)  Aspirin Ec 325 Mg Tbec (Aspirin) .... Once daily 5)   Hydrocodone-acetaminophen 5-500 Mg Tabs (Hydrocodone-acetaminophen) .... Take 1 tab four times a day as needed for pain 6)  Benzonatate 200 Mg Caps (Benzonatate) .... Take 1 tab three times a day as needed 7)  Nitrostat 0.4 Mg Subl (Nitroglycerin) .[name].. 1 by mouth as needed as direcyed 8)  Zetia 10 Mg Tabs (Ezetimibe) .... Once daily 9)  Cozaar 100 Mg Tabs (Losartan potassium) .... Two times a day 10)  Diazepam 5 Mg Tabs (Diazepam) .... Take 1 tab up to three times a day daily as needed 11)  Skelaxin 800 Mg Tabs (Metaxalone) .[name].. 1 by mouth three times a day as needed muscle spasm / back pain 12)  Tramadol Hcl 50 Mg Tabs (Tramadol hcl) .... Take 1 tab four times a day as needed for pain 13)  Zolpidem Tartrate 10 Mg Tabs (Zolpidem tartrate) .[name].. 1 by mouth at bedtime as needed insomnia 14)  Promethazine Hcl 25 Mg Tabs (Promethazine hcl) .... As needed for nausea 15)  Proair Hfa 108 (90 Base) Mcg/act Aers (Albuterol sulfate) .... 2 inh q4h as needed shortness of breath 16)  Lisinopril 10 Mg Tabs (Lisinopril) .... Take 1 tab by mouth daily 17)  Prednisone 10  Mg Tabs (Prednisone) .... Take 1 tablet by mouth once a day 18)  Prednisone 20 Mg Tabs (Prednisone) .... 2 by mouth x3 days,  1 by mouth x3 days,  1/2 by mouth x4 days,  Patient Instructions: 1)  I would use the taper of prednisone  for now.  Take it with food.  If you get a fever, worse symptoms, or discolored sputum, let us know.   Take care.  Take the tramadol for the rib pain.  Prescriptions: PREDNISONE 20 MG TABS (PREDNISONE) 2 by mouth x3 days,  1 by mouth x3 days,  1/2 by mouth x4 days,  #11 x 0   Entered and Authorized by:   Crawford Givens MD   Signed by:   Crawford Givens MD on 12/19/2009   Method used:   [facility]        [facility] [id number]       [address]       [place], [place]  [address]       Ph: [phone] or [phone]       Fax: [phone]   RxID:   [id number]    Orders Added: 1)   Est. Patient Level III [66063]    Current Allergies (reviewed today): ! CODEINE ! PENICILLIN ! * STATIN INTOLERANCE

## 2010-03-08 NOTE — Progress Notes (Signed)
Summary: Prior authorization for Zolpidem  Phone Note From Pharmacy Call back at [phone]   [facility]er[facility] Call For: Dr. Patsy Lager  Summary of Call: Received faxed form for prior authorization for Zolpidem Tartrate 10mg .  Called [phone] to get the forms faxed.  Spoke with Enrique Sack.  Medication back dated and approved from 12/11/2008 through 12/11/2009.  Initial call taken by: Linde Gillis CMA Duncan Dull),  February 21, 2009 3:42 PM     Appended Document: Prior authorization for Zolpidem Pharmacist advised, Rx was processed.  Appended Document: Prior authorization for Zolpidem Received fax from [facility], prior authorization request has been approved for Ambien 10mg .  Authorization valid from 12/11/2008 through 12/11/2009.

## 2010-03-08 NOTE — Letter (Signed)
Summary: [facility]   Imported By: Maryln Gottron 02/06/2010 15:33:49  _____________________________________________________________________  External Attachment:    Type:   Image     Comment:   External Document  Appended Document: [facility] elective chole, cardiac clearance p

## 2010-03-08 NOTE — Progress Notes (Signed)
Summary: wants abx  Phone Note Call from Patient Call back at [phone]   Caller: Patient Call For: Dr. Para March Summary of Call: Pt was seen on 11/15 for cough.  He is now coughing up yellow brown mucous and has a low grade fever of around 99.  He is asking that an abx be [facility]. Initial call taken by: Lowella Petties CMA, AAMA,  December 25, 2009 12:38 PM  Follow-up for Phone Call        sent.  have patient follow up if short of breath or progressively worse.  Follow-up by: Crawford Givens MD,  December 25, 2009 12:44 PM  Additional Follow-up for Phone Call Additional follow up Details #1::        Patient Advised.  Additional Follow-up by: Delilah Shan CMA Duncan Dull),  December 25, 2009 3:38 PM    New/Updated Medications: ZITHROMAX 250 MG TABS (AZITHROMYCIN) 2po today and then 1 by mouth once daily for 4 days. Prescriptions: ZITHROMAX 250 MG TABS (AZITHROMYCIN) 2po today and then 1 by mouth once daily for 4 days.  #6 x 0   Entered and Authorized by:   Crawford Givens MD   Signed by:   Crawford Givens MD on 12/25/2009   Method used:   [facility]        [facility] [facility]       [address]       [place], [place]  [address]       Ph: [phone] or [phone]       Fax: [phone]   RxID:   [id number]

## 2010-03-08 NOTE — Assessment & Plan Note (Signed)
Summary: surgical clearance/gall bladder surgery/jml   Visit Type:  Follow-up Referring Provider:  n/a Primary Provider:  Dr Dallas Schimke   History of Present Illness: [name] is referred back today by Dr. Rayburn Ma for surgical clearance for Cholecystectomy.  He has an ICM, Class 2 CHF and atrial fib.  He has refused coumadin in the past and only takes a full strength ASA.  He has no syncope, c/p, or sob. He has had no ICD shocks. A strategy of rate control has been chosen for his atrial fib as he has not been willing to take coumadin.  He has RUQ pain associated with his gall stones.  Current Medications (verified): 1)  Clotrimazole 10 Mg Troc (Clotrimazole) .... As Needed 2)  Celebrex 200 Mg Caps (Celecoxib) .... Take 1 Capsule As Needed For Pain 3)  Carvedilol 12.5 Mg Tabs (Carvedilol) .... Take 1 Tab Two Times A Day 4)  Aspirin Ec 325 Mg Tbec (Aspirin) .... Once Daily 5)  Hydrocodone-Acetaminophen 5-500 Mg Tabs (Hydrocodone-Acetaminophen) .... Take 1 Tab Four Times A Day As Needed For Pain 6)  Nitrostat 0.4 Mg Subl (Nitroglycerin) .[name].. 1 By Mouth As Needed As Direcyed 7)  Zetia 10 Mg Tabs (Ezetimibe) .... Once Daily 8)  Cozaar 100 Mg Tabs (Losartan Potassium) .... Two Times A Day 9)  Diazepam 5 Mg Tabs (Diazepam) .... Take 1 Tab Up To Three Times A Day Daily As Needed 10)  Skelaxin 800 Mg Tabs (Metaxalone) .[name].. 1 By Mouth Three Times A Day As Needed Muscle Spasm / Back Pain 11)  Tramadol Hcl 50 Mg Tabs (Tramadol Hcl) .... Take 1 Tab Four Times A Day As Needed For Pain 12)  Zolpidem Tartrate 10 Mg Tabs (Zolpidem Tartrate) .[name].. 1 By Mouth At Bedtime As Needed Insomnia 13)  Promethazine Hcl 25 Mg Tabs (Promethazine Hcl) .[name].. 1 By Mouth Every 8 Hours As Needed For Nausea 14)  Ventolin Hfa 108 (90 Base) Mcg/act Aers (Albuterol Sulfate) .[name].. 1-2 Puffs Q4h As Needed For Cough, Wheeze (200 Dose Mdi) 15)  Lisinopril 10 Mg  Tabs (Lisinopril) .... Take 1 Tab By Mouth Daily 16)  Tessalon 200 Mg  Caps (Benzonatate) .[name].. 1 By Mouth Three Times A Day As Needed For Cough  Allergies: 1)  ! Codeine 2)  ! Penicillin 3)  ! * Statin Intolerance  Past History:  Past Medical History: Last updated: 11/30/2009 MI/ 1995 (ICD-410.90) VENTRICULAR TACHYCARDIA (ICD-427.1) ATRIAL FIBRILLATION WITH RAPID VENTRICULAR RESPONSE (ICD-427.31) PACEMAKER, PERMANENT (ICD-V45.01) CHF/ CLASS I-II (ICD-428.0)  (26% EF), has AICD CAD, UNSPECIFIED SITE (ICD-414.00) CARDIOMYOPATHY, ISCHEMIC (ICD-414.8) HYPERLIPIDEMIAConnie intolerance to statins. Myopathies. COPD, MILD (ICD-496) DYSPHAGIA (ICD-787.29) ALLERGIC RHINITIS (ICD-477.9)  Past Surgical History: Last updated: 10/31/2008 Defibrillator-AICD (Medtronic) PTCA-Stent 1998 RTC repair, R Spirometry: 08/25/2008: FEV1% = 64, FEV1 = 89 % predicted  Review of Systems  The patient denies chest pain, syncope, dyspnea on exertion, and peripheral edema.    Vital Signs:  Patient profile:   65 year old male Height:      63 inches Weight:      168 pounds BMI:     29.87 Pulse rate:   51 / minute BP sitting:   121 / 77  (left arm)  Vitals Entered By: Laurance Flatten CMA (February 07, 2010 3:23 PM)  Physical Exam  General:  GEN: nad, alert and oriented HEENT: mucous membranes moist, TM w/o erythema, nasal epithelium injected, OP with cobblestoning NECK: supple w/o LA CV: rrr. PULM: ctab except for occ mild wheeze, no inc wob  ABD: soft, +bs EXT: no edema  max sinus minimally tender to palpation bilaterally    ICD Specifications Following MD:  Lewayne Bunting, MD     ICD Vendor:  Medtronic     ICD Model Number:  7232     ICD Serial Number:  [id number] ICD DOI:  08/26/2002     ICD Implanting MD:  Lewayne Bunting, MD  Lead 1:    Location: RV     DOI: 08/26/2002     Model #: [id number]     Serial #: [id number]     Status: active  Indications::  ICM, VT   Brady Parameters Mode VVI     Lower Rate Limit:  40      Tachy Zones VF:  200     VT:  182-200       Impression & Recommendations:  Problem # 1:  PREOPERATIVE EXAMINATION (ICD-V72.84) He is low risk for cardiovascular complications from pending Gall bladder removal. I have recommended he proceed.   Problem # 2:  AUTOMATIC IMPLANTABLE CARDIAC DEFIBRILLATOR SITU (ICD-V45.02) His device is working normally. Will recheck in several months.  Problem # 3:  ATRIAL FIBRILLATION WITH RAPID VENTRICULAR RESPONSE (ICD-427.31)  I continue to recommende coumadin but he refuses.  After his surgery, will plan to discuss trying Pradaxa instead of ASA. His updated medication list for this problem includes:    Carvedilol 12.5 Mg Tabs (Carvedilol) .[name]... Take 1 tab two times a day    Aspirin Ec 325 Mg Tbec (Aspirin) ..... Once daily  His updated medication list for this problem includes:    Carvedilol 12.5 Mg Tabs (Carvedilol) .[name]... Take 1 tab two times a day    Aspirin Ec 325 Mg Tbec (Aspirin) ..... Once daily  Patient Instructions: 1)  Your physician recommends that you schedule a follow-up appointment in: 3months with Dr [name]

## 2010-03-08 NOTE — Letter (Signed)
Summary: Device-Delinquent Check  [facility], Main Office  [address]   [place], [place] [address]   Phone: [phone]  Fax: [phone]     February 06, 2010 MRN: [id number]   [name] [address] [place], [place]  [address]   Dear Mr. Patnaude,  According to our records, you have not had your implanted device checked in the recommended period of time.  We are unable to determine appropriate device function without checking your device on a regular basis.  Please call our office to schedule an appointment , with Dr Audree Camel soon as possible.  If you are having your device checked by another physician, please call us so that we may update our records.  Thank you,   [facility]

## 2010-03-08 NOTE — Miscellaneous (Signed)
Summary: Controlled Substance Agreement  Controlled Substance Agreement   Imported By: Lanelle Bal 07/13/2009 09:02:55  _____________________________________________________________________  External Attachment:    Type:   Image     Comment:   External Document

## 2010-03-08 NOTE — Cardiovascular Report (Signed)
Summary: Office Visit Remote   Office Visit Remote   Imported By: Roderic Ovens 02/23/2010 11:03:56  _____________________________________________________________________  External Attachment:    Type:   Image     Comment:   External Document

## 2010-03-08 NOTE — Medication Information (Signed)
Summary: Approval for Ambien/BCBS  Approval for Ambien/BCBS   Imported By: Lanelle Bal 03/02/2009 11:21:51  _____________________________________________________________________  External Attachment:    Type:   Image     Comment:   External Document

## 2010-03-08 NOTE — Progress Notes (Signed)
Summary: refill request for skelaxin  Phone Note Refill Request Message from:  Fax from Pharmacy  Refills Requested: Medication #1:  SKELAXIN 800 MG TABS three times a day   Last Refilled: 02/14/2009 [facility] [address]  Initial call taken by: Lowella Petties CMA,  July 21, 2009 10:44 AM  Follow-up for Phone Call        Controlled sustance.Marland Kitchenawait Dr. Brayton Layman return on Monday.  Follow-up by: Kerby Nora MD,  July 21, 2009 11:10 AM  Additional Follow-up for Phone Call Additional follow up Details #1::        Lmom machine advising patient that rx would be refilled on monday when dr Ryhanna Dunsmore returns.Consuello Masse CMA   Additional Follow-up by: Benny Lennert CMA Duncan Dull),  July 21, 2009 12:26 PM    New/Updated Medications: SKELAXIN 800 MG TABS (METAXALONE) 1 by mouth three times a day as needed muscle spasm / back pain Prescriptions: SKELAXIN 800 MG TABS (METAXALONE) 1 by mouth three times a day as needed muscle spasm / back pain  #50 x 5   Entered and Authorized by:   Hannah Beat MD   Signed by:   Hannah Beat MD on 07/23/2009   Method used:   [facility]        [facility] [facility]       [address]       [place], [place]  [address]       Ph: [phone] or [phone]       Fax: [phone]   RxID:   [id number]

## 2010-03-08 NOTE — Assessment & Plan Note (Signed)
Summary: ?REFERRAL/CLE   Vital Signs:  Patient profile:   65 year old male Height:      63 inches Weight:      171 pounds BMI:     30.40 Temp:     98.1 degrees F oral Pulse rate:   80 / minute Pulse rhythm:   regular BP sitting:   122 / 82  (left arm) Cuff size:   regular  Vitals Entered By: Lewanda Rife LPN (November 30, 2009 3:44 PM) CC: ? referral When smells perfume it bothers pt.   History of Present Illness: 65 year old male:  Chief complaint allergic reaction to perfume  65 year old male: pleasant gentleman, long-standing heavy smoker, who has had repetitive allergic symptoms do to perfume exposure. They have been to the degree where he has required oral prednisone to call the symptoms down  DOI 09/11/2009, approx 11/13/2009, multiple others  Prior OV: Chief complaint reaction from perfume  65 year old male:  Went to eat at [facility] and came bak over and the woman came by and feels like her sinuses  he has had recurrent problems perfumes in the past, and some of them will  break out his nose,  start a series of sinus congestion and making cough and had to use an inhaler.  Allergies: 1)  ! Codeine 2)  ! Penicillin 3)  ! * Statin Intolerance  Past History:  Past medical, surgical, family and social histories (including risk factors) reviewed, and no changes noted (except as noted below).  Past Medical History: MI/ 1995 (ICD-410.90) VENTRICULAR TACHYCARDIA (ICD-427.1) ATRIAL FIBRILLATION WITH RAPID VENTRICULAR RESPONSE (ICD-427.31) PACEMAKER, PERMANENT (ICD-V45.01) CHF/ CLASS I-II (ICD-428.0)  (26% EF), has AICD CAD, UNSPECIFIED SITE (ICD-414.00) CARDIOMYOPATHY, ISCHEMIC (ICD-414.8) HYPERLIPIDEMIAConnie intolerance to statins. Myopathies. COPD, MILD (ICD-496) DYSPHAGIA (ICD-787.29) ALLERGIC RHINITIS (ICD-477.9)  Past Surgical History: Reviewed history from 10/31/2008 and no changes required. Defibrillator-AICD (Medtronic) PTCA-Stent 1998 RTC repair,  R Spirometry: 08/25/2008: FEV1% = 64, FEV1 = 89 % predicted  Past History:  Care Management: Cardiology: Dr. [name] Gastroenterology: Dr. Leone Payor  Family History: Reviewed history from 08/22/2008 and no changes required. Family History of Diabetes: Father No FH of Colon Cancer:  Social History: Reviewed history from 06/21/2008 and no changes required. Occupation: Retired, Presenter, broadcasting Patient currently smokes. (150 + PACK YEARS) Alcohol Use - no Daily Caffeine Use -1 Illicit Drug Use - no Married, no children  Review of Systems      See HPI General:  Denies chills, fatigue, and fever. Resp:  See HPI.  Physical Exam  General:  Well-developed,well-nourished,in no acute distress; alert,appropriate and cooperative throughout examination Head:  normocephalic and atraumatic.   Lungs:  normal respiratory effort, no intercostal retractions, and no accessory muscle use.   some decreased BS but no wheezing or crackles Heart:  Regular rate and rhythm; no murmurs, rubs,  or bruits.   Impression & Recommendations:  Problem # 1:  ALLERGIC REACTION (ICD-995.3) persistent, repetitive allergic reaction, particularly to perfume. Consult allergy for their opinion and recommendations.  The patient does have some COPD, and also is a long-standing very heavy smoker.  Orders: Allergy Referral  (Allergy)  Problem # 2:  WHEEZING (ICD-786.07)  Problem # 3:  COPD, MILD (ICD-496)  His updated medication list for this problem includes:    Proair Hfa 108 (90 Base) Mcg/act Aers (Albuterol sulfate) .[name]... 2 inh q4h as needed shortness of breath  Complete Medication List: 1)  Clotrimazole 10 Mg Troc (Clotrimazole) .... As needed  2)  Celebrex 200 Mg Caps (Celecoxib) .... Take 1 capsule as needed for pain 3)  Carvedilol 12.5 Mg Tabs (Carvedilol) .... Take 1 tab two times a day 4)  Aspirin Ec 325 Mg Tbec (Aspirin) .... Once daily 5)  Hydrocodone-acetaminophen 5-500 Mg Tabs  (Hydrocodone-acetaminophen) .... Take 1 tab four times a day as needed for pain 6)  Benzonatate 200 Mg Caps (Benzonatate) .... Take 1 tab three times a day as needed 7)  Nitrostat 0.4 Mg Subl (Nitroglycerin) .[name].. 1 by mouth as needed as direcyed 8)  Zetia 10 Mg Tabs (Ezetimibe) .... Once daily 9)  Cozaar 100 Mg Tabs (Losartan potassium) .... Two times a day 10)  Diazepam 5 Mg Tabs (Diazepam) .... Take 1 tab up to three times a day daily as needed 11)  Skelaxin 800 Mg Tabs (Metaxalone) .[name].. 1 by mouth three times a day as needed muscle spasm / back pain 12)  Tramadol Hcl 50 Mg Tabs (Tramadol hcl) .... Take 1 tab four times a day as needed for pain 13)  Zolpidem Tartrate 10 Mg Tabs (Zolpidem tartrate) .[name].. 1 by mouth at bedtime as needed insomnia 14)  Promethazine Hcl 25 Mg Tabs (Promethazine hcl) .... As needed for nausea 15)  Proair Hfa 108 (90 Base) Mcg/act Aers (Albuterol sulfate) .... 2 inh q4h as needed shortness of breath 16)  Niaspan 500 Mg Cr-tabs (Niacin (antihyperlipidemic)) .[name].. 1 by mouth at bedtime 17)  Niaspan 1000 Mg Cr-tabs (Niacin (antihyperlipidemic)) .[name].. 1 by mouth at bedtime 18)  Lisinopril 10 Mg Tabs (Lisinopril) .... Take 1 tab by mouth daily 19)  Prednisone 20 Mg Tabs (Prednisone) .... 2 by mouth x 5 days, then 1 by mouth x 3 days  Patient Instructions: 1)  Referral Appointment Information 2)  Day/Date: 3)  Time: 4)  Place/MD: 5)  Address: 6)  Phone/Fax: 7)  Patient given appointment information. Information/Orders faxed/mailed.    Orders Added: 1)  Allergy Referral  [Allergy] 2)  Est. Patient Level III [[address]]    Current Allergies (reviewed today): ! CODEINE ! PENICILLIN ! * STATIN INTOLERANCE

## 2010-03-08 NOTE — Progress Notes (Signed)
Summary: Rx Zolpidem  Phone Note Refill Request Call back at [phone] [facility] on February 21, 2009 10:21 AM  Refills Requested: Medication #1:  ZOLPIDEM TARTRATE 10 MG TABS 1 by mouth at bedtime as needed insomnia   Last Refilled: 01/16/2009 Received faxed refill request, please advise   Method Requested: Electronic Initial call taken by: Linde Gillis CMA Duncan Dull),  February 21, 2009 10:22 AM  Follow-up for Phone Call        Controlled substance.Marland Kitchenawait refill per Dr. Patsy Lager on Thurs.  Follow-up by: Kerby Nora MD,  February 21, 2009 10:29 AM  Additional Follow-up for Phone Call Additional follow up Details #1::        He has an open prescription at [facility] noted below. Call them and cancel that prescription if he wants it at [facility].  Cannot have 2 scripts of this out at once.   Call in to pharm [facility] choice, but [facility], then call [facility] and cancel his current script Additional Follow-up by: Hannah Beat MD,  February 21, 2009 1:14 PM    Additional Follow-up for Phone Call Additional follow up Details #2::    rx cancelled at [facility] and called in new at [facility] Follow-up by: Benny Lennert CMA Duncan Dull),  February 21, 2009 2:33 PM  Prescriptions: ZOLPIDEM TARTRATE 10 MG TABS (ZOLPIDEM TARTRATE) 1 by mouth at bedtime as needed insomnia  #30 x 2   Entered and Authorized by:   Hannah Beat MD   Signed by:   Hannah Beat MD on 02/21/2009   Method used:   Telephoned to ...       [facility] [address] (retail)       [phone]       [place], [place]  [address]       Ph: [phone]       Fax: [phone]   RxID:   [id number]

## 2010-03-08 NOTE — Assessment & Plan Note (Signed)
Summary: allergic reaction to perfume/dlo   Vital Signs:  Patient profile:   65 year old male Height:      63 inches Weight:      172.8 pounds BMI:     30.72 Temp:     97.7 degrees F oral Pulse rate:   70 / minute Pulse rhythm:   regular BP sitting:   130 / 80  (left arm) Cuff size:   regular  Vitals Entered By: Benny Lennert CMA Duncan Dull) (September 11, 2009 11:17 AM)  History of Present Illness: Chief complaint allergic reaction to perfume  65 year old male:  Now feels like he is having some popping and gurgling sounds and will cough and clear for a little while. Sometimes will cough until cartilage hurts.  Nonproductive and any sputum. Does have a history of some mild COPD.  Chief complaint reaction from perfume  65 year old male:  Went to eat at [facility] and came bak over and the woman came by and feels like her sinuses  he has had recurrent problems perfumes in the past, and some of them will  break out his nose,  start a series of sinus congestion and making cough and had to use an inhaler.  Allergies: 1)  ! Codeine 2)  ! Penicillin 3)  ! * Statin Intolerance  Past History:  Past medical, surgical, family and social histories (including risk factors) reviewed, and no changes noted (except as noted below).  Past Medical History: Reviewed history from 08/31/2009 and no changes required. MI/ 1995 (ICD-410.90) VENTRICULAR TACHYCARDIA (ICD-427.1) ATRIAL FIBRILLATION WITH RAPID VENTRICULAR RESPONSE (ICD-427.31) PACEMAKER, PERMANENT (ICD-V45.01) CHF/ CLASS I-II (ICD-428.0)  (26% EF), has AICD CAD, UNSPECIFIED SITE (ICD-414.00) CARDIOMYOPATHY, ISCHEMIC (ICD-414.8) HYPERLIPIDEMIAConnie intolerance to statins. Myopathies. COPD, MILD (ICD-496) DYSPHAGIA (ICD-787.29) ALLERGIC RHINITIS (ICD-477.9)   Cards = Sharrell Ku GI = Leone Payor  Past Surgical History: Reviewed history from 10/31/2008 and no changes required. Defibrillator-AICD (Medtronic) PTCA-Stent 1998 RTC repair,  R Spirometry: 08/25/2008: FEV1% = 64, FEV1 = 89 % predicted  Family History: Reviewed history from 08/22/2008 and no changes required. Family History of Diabetes: Father No FH of Colon Cancer:  Social History: Reviewed history from 06/21/2008 and no changes required. Occupation: Retired, Presenter, broadcasting Patient currently smokes. (150 + PACK YEARS) Alcohol Use - no Daily Caffeine Use -1 Illicit Drug Use - no Married, no children  Review of Systems      See HPI  Physical Exam  General:  Well-developed,well-nourished,in no acute distress; alert,appropriate and cooperative throughout examination Head:  normocephalic and atraumatic.   Ears:  External ear exam shows no significant lesions or deformities.  Otoscopic examination reveals clear canals, tympanic membranes are intact bilaterally without bulging, retraction, inflammation or discharge. Hearing is grossly normal bilaterally. Nose:  External nasal examination shows no deformity or inflammation. Nasal mucosa are pink and moist without lesions or exudates. Mouth:  Oral mucosa and oropharynx without lesions or exudates.  Teeth in good repair. Lungs:  normal respiratory effort, no intercostal retractions, and no accessory muscle use.   some decreased BS with rare wheezing, occasional rhonchorous sounds as well Heart:  Regular rate and rhythm; no murmurs, rubs,  or bruits. Neurologic:  alert & oriented X3 and gait normal.   Cervical Nodes:  No lymphadenopathy noted Psych:  Cognition and judgment appear intact. Alert and cooperative with normal attention span and concentration. No apparent delusions, illusions, hallucinations   Impression & Recommendations:  Problem # 1:  WHEEZING (ICD-786.07) Assessment New I  think it is a mild post inflammatory or post allergy exacerbation of his COPD. No fever, no chills, or suggestion of pneumonia on my examination. I do not think that there is a focal infiltrate on chest x-ray, but await  radiological input.  Prednisone  Orders: T-2 View CXR (71020TC)  Problem # 2:  COPD, MILD (ICD-496) Assessment: Deteriorated  His updated medication list for this problem includes:    Proair Hfa 108 (90 Base) Mcg/act Aers (Albuterol sulfate) .[name]... 2 inh q4h as needed shortness of breath  Complete Medication List: 1)  Clotrimazole 10 Mg Troc (Clotrimazole) .... As needed 2)  Celebrex 200 Mg Caps (Celecoxib) .... Take 1 capsule as needed for pain 3)  Carvedilol 12.5 Mg Tabs (Carvedilol) .... Take 1 tab two times a day 4)  Aspirin Ec 325 Mg Tbec (Aspirin) .... Once daily 5)  Hydrocodone-acetaminophen 5-500 Mg Tabs (Hydrocodone-acetaminophen) .... Take 1 tab four times a day as needed for pain 6)  Benzonatate 200 Mg Caps (Benzonatate) .... Take 1 tab three times a day as needed 7)  Nitrostat 0.4 Mg Subl (Nitroglycerin) .[name].. 1 by mouth as needed as direcyed 8)  Zetia 10 Mg Tabs (Ezetimibe) .... Once daily 9)  Cozaar 100 Mg Tabs (Losartan potassium) .... Two times a day 10)  Diazepam 5 Mg Tabs (Diazepam) .... Take 1 tab up to three times a day daily as needed 11)  Skelaxin 800 Mg Tabs (Metaxalone) .[name].. 1 by mouth three times a day as needed muscle spasm / back pain 12)  Tramadol Hcl 50 Mg Tabs (Tramadol hcl) .... Take 1 tab four times a day as needed for pain 13)  Zolpidem Tartrate 10 Mg Tabs (Zolpidem tartrate) .[name].. 1 by mouth at bedtime as needed insomnia 14)  Promethazine Hcl 25 Mg Tabs (Promethazine hcl) .... As needed for nausea 15)  Proair Hfa 108 (90 Base) Mcg/act Aers (Albuterol sulfate) .... 2 inh q4h as needed shortness of breath 16)  Niaspan 500 Mg Cr-tabs (Niacin (antihyperlipidemic)) .[name].. 1 by mouth at bedtime 17)  Niaspan 1000 Mg Cr-tabs (Niacin (antihyperlipidemic)) .[name].. 1 by mouth at bedtime 18)  Lisinopril 10 Mg Tabs (Lisinopril) .... Take 1 tab by mouth daily 19)  Prednisone 20 Mg Tabs (Prednisone) .... 2 by mouth x 5 days, then 1 by mouth x 3  days Prescriptions: PREDNISONE 20 MG TABS (PREDNISONE) 2 by mouth x 5 days, then 1 by mouth x 3 days  #13 x 0   Entered and Authorized by:   Hannah Beat MD   Signed by:   Hannah Beat MD on 09/11/2009   Method used:   [facility]        [facility] [id number]       [address]       [place], [place]  [address]       Ph: [phone] or [phone]       Fax: [phone]   RxID:   [id number] LISINOPRIL 10 MG  TABS (LISINOPRIL) Take 1 tab by mouth daily  #90 x 3   Entered and Authorized by:   Hannah Beat MD   Signed by:   Hannah Beat MD on 09/11/2009   Method used:   Historical   RxID:   [id number]   Current Allergies (reviewed today): ! CODEINE ! PENICILLIN ! * STATIN INTOLERANCE

## 2010-03-08 NOTE — Miscellaneous (Signed)
  Clinical Lists Changes  Medications: Added new medication of CRESTOR 5 MG TABS (ROSUVASTATIN CALCIUM) take one tablet daily - Signed Rx of CRESTOR 5 MG TABS (ROSUVASTATIN CALCIUM) take one tablet daily;  #30 x 5;  Signed;  Entered by: Benny Lennert CMA (AAMA);  Authorized by: Hannah Beat MD;  Method used: [facility] [place], [place]  [address], Ph: [phone] or [phone], Fax: [phone]    Prescriptions: CRESTOR 5 MG TABS (ROSUVASTATIN CALCIUM) take one tablet daily  #30 x 5   Entered by:   Benny Lennert CMA (AAMA)   Authorized by:   Hannah Beat MD   Signed by:   Benny Lennert CMA (AAMA) on 08/24/2009   Method used:   Electronically [facility]        [facility] [id number]       [address]       [place], [place]  [address]       Ph: [phone] or [phone]       Fax: [phone]   RxID:   [id number]   Prior Medications: CLOTRIMAZOLE 10 MG TROC (CLOTRIMAZOLE) as needed CELEBREX 200 MG CAPS (CELECOXIB) Take 1 capsule as needed for pain CARVEDILOL 12.5 MG TABS (CARVEDILOL) Take 1 tab two times a day ASPIRIN EC 325 MG TBEC (ASPIRIN) once daily LISINOPRIL 10 MG TABS (LISINOPRIL) once daily HYDROCODONE-ACETAMINOPHEN 5-500 MG TABS (HYDROCODONE-ACETAMINOPHEN) Take 1 tab four times a day as needed for pain BENZONATATE 200 MG CAPS (BENZONATATE) Take 1 tab three times a day as needed NITROGLYCERIN 0.4 MG/HR PT24 (NITROGLYCERIN) as needed ZETIA 10 MG TABS (EZETIMIBE) once daily COZAAR 100 MG TABS (LOSARTAN POTASSIUM) two times a day DIAZEPAM 5 MG TABS (DIAZEPAM) Take 1 tab up to three times a day daily as needed SKELAXIN 800 MG TABS (METAXALONE) 1 by mouth three times a day as needed muscle spasm / back pain TRAMADOL HCL 50 MG TABS (TRAMADOL HCL) TAke 1 tab four times a day as needed for pain ZOLPIDEM TARTRATE 10 MG TABS (ZOLPIDEM TARTRATE) 1 by mouth at bedtime as needed insomnia PROMETHAZINE HCL  25 MG TABS (PROMETHAZINE HCL) as needed for nausea PROAIR HFA 108 (90 BASE) MCG/ACT  AERS (ALBUTEROL SULFATE) 2 inh q4h as needed shortness of breath Current Allergies: ! CODEINE ! PENICILLIN

## 2010-03-08 NOTE — Progress Notes (Signed)
Summary: refill request for ambien  Phone Note Refill Request Message from:  Fax from Pharmacy  Refills Requested: Medication #1:  ZOLPIDEM TARTRATE 10 MG TABS 1 by mouth at bedtime as needed insomnia   Last Refilled: 09/17/2009 [facility] [address]  Initial call taken by: Lowella Petties CMA,  October 16, 2009 11:10 AM  Follow-up for Phone Call        Rx called to pharmacy Follow-up by: Linde Gillis CMA Duncan Dull),  October 16, 2009 1:49 PM    Prescriptions: ZOLPIDEM TARTRATE 10 MG TABS (ZOLPIDEM TARTRATE) 1 by mouth at bedtime as needed insomnia  #30 x 4   Entered and Authorized by:   Hannah Beat MD   Signed by:   Hannah Beat MD on 10/16/2009   Method used:   Telephoned to ...       [facility] (retail)       [address]       [place], [place]  [address]       Ph: [phone] or [phone]       Fax: [phone]   RxID:   [id number]

## 2010-03-08 NOTE — Progress Notes (Signed)
Summary: rx refill   Phone Note Refill Request Message from:  Patient on February 13, 2009 10:32 AM  Refills Requested: Medication #1:  HYDROCODONE-ACETAMINOPHEN 5-500 MG TABS Take 1 tab four times a day as needed for pain  Medication #2:  DIAZEPAM 5 MG TABS Take 1 tab up to three times a day daily as needed Patient [facility]   Initial call taken by: Melody Comas,  February 13, 2009 10:33 AM Caller: Patient Call For: Hannah Beat MD  Follow-up for Phone Call        [facility] Follow-up by: Hannah Beat MD,  February 13, 2009 12:26 PM  Additional Follow-up for Phone Call Additional follow up Details #1::        [facility]. Additional Follow-up by: Linde Gillis CMA Duncan Dull),  February 13, 2009 3:01 PM    Prescriptions: DIAZEPAM 5 MG TABS (DIAZEPAM) Take 1 tab up to three times a day daily as needed  #40 x 1   Entered and Authorized by:   Hannah Beat MD   Signed by:   Hannah Beat MD on 02/13/2009   Method used:   Telephoned to ...       [facility] [address] (retail)       [phone]       [place], [place]  [address]       Ph: [phone]       Fax: [phone]   RxID:   [id number] HYDROCODONE-ACETAMINOPHEN 5-500 MG TABS (HYDROCODONE-ACETAMINOPHEN) Take 1 tab four times a day as needed for pain  #50 x 1   Entered and Authorized by:   Hannah Beat MD   Signed by:   Hannah Beat MD on 02/13/2009   Method used:   Telephoned to ...       [facility] [address] (retail)       [phone]       [place], [place]  [address]       Ph: [phone]       Fax: [phone]   RxID:   [id number]

## 2010-03-08 NOTE — Assessment & Plan Note (Signed)
Summary: severe cold/alc   Vital Signs:  Patient profile:   65 year old male Height:      63 inches Weight:      169.2 pounds BMI:     30.08 Temp:     97.8 degrees F oral Pulse rate:   72 / minute Pulse rhythm:   regular BP sitting:   110 / 78  (left arm) Cuff size:   regular  Vitals Entered By: Benny Lennert CMA Duncan Dull) (August 16, 2009 8:17 AM)  History of Present Illness: Chief complaint severe cold  65 year old male:  Metallic tasting drainage down the back of his throat.   Has some sores in his nose.   Acute Visit History:      The patient complains of cough, earache, headache, nasal discharge, sinus problems, and sore throat.  These symptoms began 1 week ago.  Other comments include: 10 days history.        The patient notes wheezing.  He has a history of COPD.  There is no history of sleep interference, shortness of breath, respiratory retractions, tachypnea, cyanosis, or interference with oral intake associated with his cough.        There is no history of recent antibiotic usage, cold/URI symptoms, or recurrent otitis media associated with the earache.        'Cold' or URI symptoms have been present with the sore throat.  There is no history of dysphagia, drooling, or recent exposure to strep.        He complains of sinus pressure, nasal congestion, purulent drainage, and frontal headache.        Urine output has been normal.  He is tolerating clear liquids.        Allergies: 1)  ! Codeine 2)  ! Penicillin  Past History:  Past medical, surgical, family and social histories (including risk factors) reviewed, and no changes noted (except as noted below).  Past Medical History: Reviewed history from 10/31/2008 and no changes required. MI/ 1995 (ICD-410.90) VENTRICULAR TACHYCARDIA (ICD-427.1) ATRIAL FIBRILLATION WITH RAPID VENTRICULAR RESPONSE (ICD-427.31) PACEMAKER, PERMANENT (ICD-V45.01) CHF/ CLASS I-II (ICD-428.0)  (26% EF), has AICD CAD, UNSPECIFIED SITE  (ICD-414.00) CARDIOMYOPATHY, ISCHEMIC (ICD-414.8) HYPERLIPIDEMIA (ICD-272.4) COPD, MILD (ICD-496) DYSPHAGIA (ICD-787.29) ALLERGIC RHINITIS (ICD-477.9)   Cards = Sharrell Ku GI = [facility] Payor  Past Surgical History: Reviewed history from 10/31/2008 and no changes required. Defibrillator-AICD (Medtronic) PTCA-Stent 1998 RTC repair, R Spirometry: 08/25/2008: FEV1% = 64, FEV1 = 89 % predicted  Family History: Reviewed history from 08/22/2008 and no changes required. Family History of Diabetes: Father No FH of Colon Cancer:  Social History: Reviewed history from 06/21/2008 and no changes required. Occupation: Retired, Presenter, broadcasting Patient currently smokes. (150 + PACK YEARS) Alcohol Use - no Daily Caffeine Use -1 Illicit Drug Use - no Married, no children  Review of Systems       REVIEW OF SYSTEMS GEN: Acute illness details above. CV: No chest pain or SOB GI: No noted N or V Otherwise, pertinent positives and negatives are noted in the HPI.   Physical Exam  Lungs:  normal respiratory effort, no intercostal retractions, and no accessory muscle use.   some decreased BS with rare wheezing Heart:  Regular rate and rhythm; no murmurs, rubs,  or bruits. Additional Exam:  GEN: A and O x 3. WDWN. NAD.    ENT: Nose clear, ext NML.  No LAD.  No JVD.  TM's clear. Oropharynx clear.  ABD: S, NT, ND, + BS. No  rebound. No guarding. No HSM.   EXT: warm and well-perfused, No c/c/e. PSYCH: Pleasant and conversant.    Impression & Recommendations:  Problem # 1:  VIRAL INFECTION-UNSPEC (ICD-079.99) Assessment New likely viral infection with prolonged cough, > 10 days, in COPD will cover for atypicals, use bronchodilators  The following medications were removed from the medication list:    Tessalon 200 Mg Caps (Benzonatate) .[name]... Take one capsule by mouth three times a day as needed for cough His updated medication list for this problem includes:    Celebrex 200 Mg Caps (Celecoxib)  .[name]... Take 1 capsule as needed for pain    Aspirin Ec 325 Mg Tbec (Aspirin) ..... Once daily    Benzonatate 200 Mg Caps (Benzonatate) .[name]... Take 1 tab three times a day as needed  Problem # 2:  COPD, MILD (ICD-496) Assessment: Deteriorated mildly, albuterol as needed   His updated medication list for this problem includes:    Proair Hfa 108 (90 Base) Mcg/act Aers (Albuterol sulfate) .[name]... 2 inh q4h as needed shortness of breath  Complete Medication List: 1)  Clotrimazole 10 Mg Troc (Clotrimazole) .... Take 1 tab four times a day 2)  Celebrex 200 Mg Caps (Celecoxib) .... Take 1 capsule as needed for pain 3)  Carvedilol 12.5 Mg Tabs (Carvedilol) .... Take 1 tab two times a day 4)  Aspirin Ec 325 Mg Tbec (Aspirin) .... Once daily 5)  Lisinopril 10 Mg Tabs (Lisinopril) .... Once daily 6)  Hydrocodone-acetaminophen 5-500 Mg Tabs (Hydrocodone-acetaminophen) .... Take 1 tab four times a day as needed for pain 7)  Benzonatate 200 Mg Caps (Benzonatate) .... Take 1 tab three times a day as needed 8)  Nitroglycerin 0.4 Mg/hr Pt24 (Nitroglycerin) .... As needed 9)  Zetia 10 Mg Tabs (Ezetimibe) .... Once daily 10)  Cozaar 100 Mg Tabs (Losartan potassium) .... Two times a day 11)  Diazepam 5 Mg Tabs (Diazepam) .... Take 1 tab up to three times a day daily as needed 12)  Skelaxin 800 Mg Tabs (Metaxalone) .[name].. 1 by mouth three times a day as needed muscle spasm / back pain 13)  Tramadol Hcl 50 Mg Tabs (Tramadol hcl) .... Take 1 tab four times a day as needed for pain 14)  Zolpidem Tartrate 10 Mg Tabs (Zolpidem tartrate) .[name].. 1 by mouth at bedtime as needed insomnia 15)  Promethazine Hcl 25 Mg Tabs (Promethazine hcl) .... As needed for nausea 16)  Proair Hfa 108 (90 Base) Mcg/act Aers (Albuterol sulfate) .... 2 inh q4h as needed shortness of breath 17)  Triamcinolone Acetonide 0.1 % Crea (Triamcinolone acetonide) .... Apply bid to affected area 18)  Azithromycin 250 Mg Tabs (Azithromycin) .... 2 by   mouth today and then 1 daily for 4 days Prescriptions: AZITHROMYCIN 250 MG  TABS (AZITHROMYCIN) 2 by  mouth today and then 1 daily for 4 days  #6 x 0   Entered and Authorized by:   Hannah Beat MD   Signed by:   Hannah Beat MD on 08/16/2009   Method used:   [facility]        [facility] ([facility]ail)       [address]       [place], [place]  [address]       Ph: [phone] or [phone]       Fax: [phone]   RxID:   [id number] BENZONATATE 200 MG CAPS (BENZONATATE) Take 1 tab three times a day as needed  #60 x 2  Entered and Authorized by:   Hannah Beat MD   Signed by:   Hannah Beat MD on 08/16/2009   Method used:   [facility]        [facility] (retail)       [address]       [place], [place]  [address]       Ph: [phone] or [phone]       Fax: [phone]   RxID:   [id number]   Current Allergies (reviewed today): ! CODEINE ! PENICILLIN

## 2010-03-08 NOTE — Cardiovascular Report (Signed)
Summary: Certified Letter Signed - Patient (not doing f/u)  Certified Letter Signed - Patient (not doing f/u)   Imported By: Debby Freiberg 02/21/2010 16:48:40  _____________________________________________________________________  External Attachment:    Type:   Image     Comment:   External Document

## 2010-03-08 NOTE — Letter (Signed)
Summary: Device-Delinquent Phone Journalist, newspaper, Main Office  [address]   [place], [place] [address]   Phone: [phone]  Fax: [phone]     December 01, 2009 MRN: [id number]   [name] [address] [place], [place]  [address]   Dear [name],  According to our records, you were scheduled for a device phone transmission on  11-23-2009.     We did not receive any results from this check.  If you transmitted on your scheduled day, please call us to help troubleshoot your system.  If you forgot to send your transmission, please send one upon receipt of this letter.  Thank you,   Architectural [facility]

## 2010-03-08 NOTE — Progress Notes (Signed)
Summary: still burning w/ urination   Phone Note Call from Patient Call back at [phone]   Caller: Patient Call For: Hannah Beat MD Summary of Call: Patient has now finished his cipro and he is still having some slight burning when urinating. He has not noticed any more blood. He is asknig if he needs to take another round of cipro. Please advise. [facility]  Initial call taken by: Melody Comas,  January 24, 2010 9:11 AM  Follow-up for Phone Call        office evaluation indicated, we will need to get a urine culture, face to face encounter needed. Hannah Beat MD  January 24, 2010 9:18 AM   Additional Follow-up for Phone Call Additional follow up Details #1::        appt today with dr copland at 11:45am Additional Follow-up by: Benny Lennert CMA (AAMA),  January 24, 2010 9:22 AM

## 2010-03-08 NOTE — Progress Notes (Signed)
Summary: refill request for diazepam  Phone Note Refill Request Call back at Home Phone [phone] Message from:  Patient  Refills Requested: Medication #1:  DIAZEPAM 5 MG TABS Take 1 tab up to three times a day daily as needed Phoned request from pt, please [facility].  Initial call taken by: Lowella Petties CMA,  February 06, 2009 4:15 PM  Follow-up for Phone Call        Not adressed in notes by Dr. Patsy Lager.. not refilled here before.  Can it wait for Dr. Cyndie Chime return?  Follow-up by: Kerby Nora MD,  February 06, 2009 5:02 PM  Additional Follow-up for Phone Call Additional follow up Details #1::        patient advised.Consuello Masse CMA  Additional Follow-up by: Benny Lennert CMA (AAMA),  February 08, 2009 2:15 PM

## 2010-03-08 NOTE — Progress Notes (Signed)
Summary: Rx Hydrocodone/APAP  Phone Note Refill Request Call back at [phone] Message [facility]:  [facility] on January 30, 2010 8:27 AM  Refills Requested: Medication #1:  HYDROCODONE-ACETAMINOPHEN 5-500 MG TABS Take 1 tab four times a day as needed for pain   Last Refilled: 12/31/2009 Received faxed refill request please advise.   Method Requested: Telephone to Pharmacy Initial call taken by: Linde Gillis CMA Duncan Dull),  January 30, 2010 8:27 AM  Follow-up for Phone Call        Rx called to pharmacy Follow-up by: Benny Lennert CMA Duncan Dull),  January 30, 2010 10:14 AM    Prescriptions: HYDROCODONE-ACETAMINOPHEN 5-500 MG TABS (HYDROCODONE-ACETAMINOPHEN) Take 1 tab four times a day as needed for pain  #50 x 3   Entered and Authorized by:   Hannah Beat MD   Signed by:   Hannah Beat MD on 01/30/2010   Method used:   Telephoned to ...       [facility] (retail)       [address]       [place], [place]  [address]       Ph: [phone] or [phone]       Fax: [phone]   RxID:   [id number]

## 2010-03-08 NOTE — Assessment & Plan Note (Signed)
Summary: RESPORTIARY PROBLEMS/RBH   Vital Signs:  Patient profile:   65 year old male Height:      63 inches Weight:      169 pounds BMI:     30.05 O2 Sat:      96 % on Room air Temp:     97.6 degrees F oral Pulse rate:   84 / minute Pulse rhythm:   regular Resp:     16 per minute BP sitting:   122 / 84 Cuff size:   regular  Vitals Entered By: Delilah Shan CMA Duncan Dull) (January 22, 2010 9:08 AM)  O2 Flow:  Room air CC: Respiratory problems   History of Present Illness: He talked to Dr. Rayburn Ma about gallbladder surgery.  Scheduled for cards follow up and then hopefully with removal per surgery.    Wife had been sick recently and patient was caring for her.  Sx for patient started about 1 week ago. cough, rhinorrhea, nasal congestion.  Inc in wheeze.  Dec in appetite.  No fevers.  Sx getting worse per patient "everything is worse".  Facial pain and nosebleed noted with blowing note.  Raw throat from cough.  Used SABA with good effect for cough, no cardiac side effects.    Currently on cipro per surgery.  Asking for advice.   Allergies: 1)  ! Codeine 2)  ! Penicillin 3)  ! * Statin Intolerance  Review of Systems       See HPI.  Otherwise negative.    Physical Exam  General:  GEN: nad, alert and oriented HEENT: mucous membranes moist, TM w/o erythema, nasal epithelium injected, OP with cobblestoning NECK: supple w/o LA CV: rrr. PULM: ctab except for occ mild wheeze, no inc wob ABD: soft, +bs EXT: no edema  max sinus minimally tender to palpation bilaterally   Impression & Recommendations:  Problem # 1:  WHEEZING (ICD-786.07) Lungs clear other than wheezing and already on cipro, so no need to rx other antibiotics for sinus pain.  Nontoxic.  Likely viral process.  Continue SABA and follow up as needed.  Okay for outpatient follow up.  He agrees with plan.   Orders: Prescription Created Electronically [phone])  Complete Medication List: 1)  Clotrimazole 10 Mg  Troc (Clotrimazole) .... As needed 2)  Celebrex 200 Mg Caps (Celecoxib) .... Take 1 capsule as needed for pain 3)  Carvedilol 12.5 Mg Tabs (Carvedilol) .... Take 1 tab two times a day 4)  Aspirin Ec 325 Mg Tbec (Aspirin) .... Once daily 5)  Hydrocodone-acetaminophen 5-500 Mg Tabs (Hydrocodone-acetaminophen) .... Take 1 tab four times a day as needed for pain 6)  Nitrostat 0.4 Mg Subl (Nitroglycerin) .[name].. 1 by mouth as needed as direcyed 7)  Zetia 10 Mg Tabs (Ezetimibe) .... Once daily 8)  Cozaar 100 Mg Tabs (Losartan potassium) .... Two times a day 9)  Diazepam 5 Mg Tabs (Diazepam) .... Take 1 tab up to three times a day daily as needed 10)  Skelaxin 800 Mg Tabs (Metaxalone) .[name].. 1 by mouth three times a day as needed muscle spasm / back pain 11)  Tramadol Hcl 50 Mg Tabs (Tramadol hcl) .... Take 1 tab four times a day as needed for pain 12)  Zolpidem Tartrate 10 Mg Tabs (Zolpidem tartrate) .[name].. 1 by mouth at bedtime as needed insomnia 13)  Promethazine Hcl 25 Mg Tabs (Promethazine hcl) .[name].. 1 by mouth every 8 hours as needed for nausea 14)  Ventolin Hfa 108 (90 Base) Mcg/act Aers (Albuterol  sulfate) .[name].. 1-2 puffs q4h as needed for cough, wheeze (200 dose mdi) 15)  Lisinopril 10 Mg Tabs (Lisinopril) .... Take 1 tab by mouth daily 16)  Tessalon 200 Mg Caps (Benzonatate) .[name].. 1 by mouth three times a day as needed for cough  Patient Instructions: 1)  Use the inhaler and tessalon as needed for cough and the promethazine for nausea.  Take care.  You should gradually get better.  Let us know if you aren't improving.   Prescriptions: TESSALON 200 MG CAPS (BENZONATATE) 1 by mouth three times a day as needed for cough  #30 x 1   Entered and Authorized by:   Crawford Givens MD   Signed by:   Crawford Givens MD on 01/22/2010   Method used:   [facility]        [facility] [facility]       [address]       [place], [place]  [address]       Ph: [phone] or [phone]        Fax: [phone]   RxID:   [id number] PROMETHAZINE HCL 25 MG TABS (PROMETHAZINE HCL) 1 by mouth every 8 hours as needed for nausea  #30 x 1   Entered and Authorized by:   Crawford Givens MD   Signed by:   Crawford Givens MD on 01/22/2010   Method used:   [facility]        [facility] [facility]       [address]       [place], [place]  [address]       Ph: [phone] or [phone]       Fax: [phone]   RxID:   [id number] VENTOLIN HFA 108 (90 BASE) MCG/ACT AERS (ALBUTEROL SULFATE) 1-2 puffs q4h as needed for cough, wheeze (200 dose MDI)  #1 x 5   Entered and Authorized by:   Crawford Givens MD   Signed by:   Crawford Givens MD on 01/22/2010   Method used:   [facility]        [facility] [id number]       [address]       [place], [place]  [address]       Ph: [phone] or [phone]       Fax: [phone]   RxID:   [id number]    Orders Added: 1)  Est. Patient Level III [10175] 2)  Prescription Created Electronically [phone]    Current Allergies (reviewed today): ! CODEINE ! PENICILLIN ! * STATIN INTOLERANCE

## 2010-03-08 NOTE — Progress Notes (Signed)
Summary: refill request for vicodin  Phone Note Refill Request Message from:  Fax from Pharmacy  Refills Requested: Medication #1:  HYDROCODONE-ACETAMINOPHEN 5-500 MG TABS Take 1 tab four times a day as needed for pain   Last Refilled: 03/22/2009 [facility] [address], [phone].  Initial call taken by: Lowella Petties CMA,  April 28, 2009 8:37 AM  Follow-up for Phone Call        Await refill by [place].  Follow-up by: Kerby Nora MD,  April 28, 2009 8:41 AM    Prescriptions: HYDROCODONE-ACETAMINOPHEN 5-500 MG TABS (HYDROCODONE-ACETAMINOPHEN) Take 1 tab four times a day as needed for pain  #50 x 3   Entered and Authorized by:   Hannah Beat MD   Signed by:   Hannah Beat MD on 04/29/2009   Method used:   Telephoned to ...       [facility] [id number]       [address]       [place], [place]  [address]       Ph: [phone] or [phone]       Fax: [phone]   RxID:   [id number] HYDROCODONE-ACETAMINOPHEN 5-500 MG TABS (HYDROCODONE-ACETAMINOPHEN) Take 1 tab four times a day as needed for pain  #50 x 3   Entered and Authorized by:   Hannah Beat MD   Signed by:   Hannah Beat MD on 04/29/2009   Method used:   Telephoned to ...       [facility] [address] (retail)       [id number]       [place], [place]  [address]       Ph: [phone]       Fax: [phone]   RxID:   [id number]   Appended Document: refill request for vicodin rx called to pharamcy

## 2010-03-08 NOTE — Assessment & Plan Note (Signed)
Summary: ? UTI   Vital Signs:  Patient profile:   65 year old male Height:      63 inches Weight:      168.50 pounds BMI:     29.96 Temp:     97.6 degrees F oral Pulse rate:   80 / minute Pulse rhythm:   regular BP sitting:   110 / 70  (left arm) Cuff size:   regular  Vitals Entered By: Benny Lennert CMA Duncan Dull) (January 25, 2010 9:26 AM)  History of Present Illness: Chief complaint ? uti  65 year old male:  Tinges of blood colored stuff in his urine. Periodically will have some burning at the end of his penis.  No discharge, no known sexual exposures or STD worries  no flank pain. no new abdominal pain.   ROS: no fever, chills, sweats, cont with RUQ abd pain. eating OK. no diarrhea or vomitting.  GEN: WDWN, NAD, Non-toxic, A & O x 3 HEENT: Atraumatic, Normocephalic. Neck supple. No masses, No LAD. Ears and Nose: No external deformity. GU: Normal externally, no discharge. Rectal, normal tone, prostate nontender ABD: S, NT, ND, +BS. No rebound tenderness. No HSM.  EXTR: No c/c/e NEURO: Normal gait.  PSYCH: Normally interactive. Conversant. Not depressed or anxious appearing.  Calm demeanor.    Current Problems (verified): 1)  Gross Hematuria  (ICD-599.71) 2)  Dysuria  (ICD-788.1) 3)  Automatic Implantable Cardiac Defibrillator Situ  (ICD-V45.02) 4)  Mi/ 1995  (ICD-410.90) 5)  Atrial Fibrillation With Rapid Ventricular Response  (ICD-427.31) 6)  Chf/ Class I-ii  (ICD-428.0) 7)  Cad, Unspecified Site  (ICD-414.00) 8)  Hyperlipidemia  (ICD-272.4) 9)  Copd, Mild  (ICD-496) 10)  Dysphagia  (ICD-787.29) 11)  Allergic Rhinitis  (ICD-477.9)  Allergies: 1)  ! Codeine 2)  ! Penicillin 3)  ! * Statin Intolerance  Past History:  Past medical, surgical, family and social histories (including risk factors) reviewed, and no changes noted (except as noted below).  Past Medical History: Reviewed history from 11/30/2009 and no changes required. MI/ 1995  (ICD-410.90) VENTRICULAR TACHYCARDIA (ICD-427.1) ATRIAL FIBRILLATION WITH RAPID VENTRICULAR RESPONSE (ICD-427.31) PACEMAKER, PERMANENT (ICD-V45.01) CHF/ CLASS I-II (ICD-428.0)  (26% EF), has AICD CAD, UNSPECIFIED SITE (ICD-414.00) CARDIOMYOPATHY, ISCHEMIC (ICD-414.8) HYPERLIPIDEMIAConnie intolerance to statins. Myopathies. COPD, MILD (ICD-496) DYSPHAGIA (ICD-787.29) ALLERGIC RHINITIS (ICD-477.9)  Past Surgical History: Reviewed history from 10/31/2008 and no changes required. Defibrillator-AICD (Medtronic) PTCA-Stent 1998 RTC repair, R Spirometry: 08/25/2008: FEV1% = 64, FEV1 = 89 % predicted  Family History: Reviewed history from 08/22/2008 and no changes required. Family History of Diabetes: Father No FH of Colon Cancer:  Social History: Reviewed history from 06/21/2008 and no changes required. Occupation: Retired, Presenter, broadcasting Patient currently smokes. (150 + PACK YEARS) Alcohol Use - no Daily Caffeine Use -1 Illicit Drug Use - no Married, no children   Impression & Recommendations:  Problem # 1:  GROSS HEMATURIA (ICD-599.71) Assessment New long smoking history with macroscopic hematuria, needs urological work-up. Not really c/w UTI on history, prostate NT, penis NT.   His updated medication list for this problem includes:    Suprax 400 Mg Tabs (Cefixime) .[name]... 1 by mouth now    Doxycycline Hyclate 100 Mg Caps (Doxycycline hyclate) .[name]... Take 1 tab twice a day  Orders: Urology Referral (Urology)  Problem # 2:  DYSURIA (ICD-788.1) Assessment: New will cover for urethritis, empirically  His updated medication list for this problem includes:    Suprax 400 Mg Tabs (Cefixime) .[name]... 1 by mouth  now    Doxycycline Hyclate 100 Mg Caps (Doxycycline hyclate) .[name]... Take 1 tab twice a day  Orders: UA Dipstick w/o Micro (manual) (86578)  Complete Medication List: 1)  Clotrimazole 10 Mg Troc (Clotrimazole) .... As needed 2)  Celebrex 200 Mg Caps (Celecoxib) ....  Take 1 capsule as needed for pain 3)  Carvedilol 12.5 Mg Tabs (Carvedilol) .... Take 1 tab two times a day 4)  Aspirin Ec 325 Mg Tbec (Aspirin) .... Once daily 5)  Hydrocodone-acetaminophen 5-500 Mg Tabs (Hydrocodone-acetaminophen) .... Take 1 tab four times a day as needed for pain 6)  Nitrostat 0.4 Mg Subl (Nitroglycerin) .[name].. 1 by mouth as needed as direcyed 7)  Zetia 10 Mg Tabs (Ezetimibe) .... Once daily 8)  Cozaar 100 Mg Tabs (Losartan potassium) .... Two times a day 9)  Diazepam 5 Mg Tabs (Diazepam) .... Take 1 tab up to three times a day daily as needed 10)  Skelaxin 800 Mg Tabs (Metaxalone) .[name].. 1 by mouth three times a day as needed muscle spasm / back pain 11)  Tramadol Hcl 50 Mg Tabs (Tramadol hcl) .... Take 1 tab four times a day as needed for pain 12)  Zolpidem Tartrate 10 Mg Tabs (Zolpidem tartrate) .[name].. 1 by mouth at bedtime as needed insomnia 13)  Promethazine Hcl 25 Mg Tabs (Promethazine hcl) .[name].. 1 by mouth every 8 hours as needed for nausea 14)  Ventolin Hfa 108 (90 Base) Mcg/act Aers (Albuterol sulfate) .[name].. 1-2 puffs q4h as needed for cough, wheeze (200 dose mdi) 15)  Lisinopril 10 Mg Tabs (Lisinopril) .... Take 1 tab by mouth daily 16)  Tessalon 200 Mg Caps (Benzonatate) .[name].. 1 by mouth three times a day as needed for cough 17)  Suprax 400 Mg Tabs (Cefixime) .[name].. 1 by mouth now 18)  Doxycycline Hyclate 100 Mg Caps (Doxycycline hyclate) .... Take 1 tab twice a day  Patient Instructions: 1)  CALL ME IN 2 WEEKS IF YOU ARE STILL HAVING ANY BLOOD IN YOUR URINE. 2)  Referral Appointment Information 3)  Day/Date: 4)  Time: 5)  Place/MD: 6)  Address: 7)  Phone/Fax: 8)  Patient given appointment information. Information/Orders faxed/mailed.  Prescriptions: DOXYCYCLINE HYCLATE 100 MG CAPS (DOXYCYCLINE HYCLATE) Take 1 tab twice a day  #14 x 0   Entered and Authorized by:   Hannah Beat MD   Signed by:   Hannah Beat MD on 01/25/2010   Method used:   [facility]         [facility] [facility]       [address]       [place], [place]  [address]       Ph: [phone] or [phone]       Fax: [phone]   RxID:   [id number] SUPRAX 400 MG TABS (CEFIXIME) 1 by mouth now  #1 x 0   Entered and Authorized by:   Hannah Beat MD   Signed by:   Hannah Beat MD on 01/25/2010   Method used:   [facility]ron[facility] to        [facility] [facility]ail)       [address]       [place], [place]  [address]       Ph: [phone] or [phone]       Fax: [phone]   RxID:   [id number]    Orders Added: 1)  UA Dipstick w/o Micro (manual) [81002] 2)  Urology Referral [Urology] 3)  Est. Patient Level  IV [id number]    Current Allergies (reviewed today): ! CODEINE ! PENICILLIN ! * STATIN INTOLERANCE  Laboratory Results   Urine Tests  Date/Time Received: January 25, 2010 9:31 AM  Date/Time Reported: January 25, 2010 9:31 AM   Routine Urinalysis   Glucose: negative   (Normal Range: Negative) Bilirubin: negative   (Normal Range: Negative) Ketone: negative   (Normal Range: Negative) Spec. Gravity: >=1.030   (Normal Range: 1.003-1.035) Blood: moderate   (Normal Range: Negative) pH: 5.0   (Normal Range: 5.0-8.0) Protein: 30   (Normal Range: Negative) Urobilinogen: 0.2   (Normal Range: 0-1) Nitrite: negative   (Normal Range: Negative) Leukocyte Esterace: negative   (Normal Range: Negative)

## 2010-03-08 NOTE — Progress Notes (Signed)
Summary: refill request for diazepam  Phone Note Refill Request Message from:  Fax from Pharmacy  Refills Requested: Medication #1:  DIAZEPAM 5 MG TABS Take 1 tab up to three times a day daily as needed   Last Refilled: 04/10/2009 Faxed request from [facility] [address], [phone].  Initial call taken by: Lowella Petties CMA,  Jun 15, 2009 10:04 AM  Follow-up for Phone Call        [facility] Follow-up by: Hannah Beat MD,  Jun 15, 2009 10:05 AM  Additional Follow-up for Phone Call Additional follow up Details #1::        rx called in.Consuello Masse CMA  Additional Follow-up by: Benny Lennert CMA Duncan Dull),  Jun 15, 2009 10:14 AM    Prescriptions: DIAZEPAM 5 MG TABS (DIAZEPAM) Take 1 tab up to three times a day daily as needed  #40 x 1   Entered and Authorized by:   Hannah Beat MD   Signed by:   Hannah Beat MD on 06/15/2009   Method used:   Telephoned to ...       [facility] [facility]ail)       [address]       [place], [place]  [address]       Ph: [phone] or [phone]       Fax: [phone]   RxID:   [id number] DIAZEPAM 5 MG TABS (DIAZEPAM) Take 1 tab up to three times a day daily as needed  #40 x 1   Entered and Authorized by:   Hannah Beat MD   Signed by:   Hannah Beat MD on 06/15/2009   Method used:   Telephoned to ...       [facility] [address] (retail)       [phone]       [place], [place]  [address]       Ph: [phone]       Fax: [phone]   RxID:   [id number]

## 2010-03-08 NOTE — Assessment & Plan Note (Signed)
Summary: df2   Visit Type:  Follow-up Referring Provider:  n/a Primary Provider:  Dr Dallas Schimke   History of Present Illness: [name] returns today for ICD followup.  He is a pleasant 65 yo man with a ICM, HTN, VT s/p ICD implant 6 yrs ago.  He has not had any intercurrent ICD therapies since we last saw him.  No other complaints. Today.  He denies c/p, sob, or syncope.  Current Medications (verified): 1)  Clotrimazole 10 Mg Troc (Clotrimazole) .... As Needed 2)  Celebrex 200 Mg Caps (Celecoxib) .... Take 1 Capsule As Needed For Pain 3)  Carvedilol 12.5 Mg Tabs (Carvedilol) .... Take 1 Tab Two Times A Day 4)  Aspirin Ec 325 Mg Tbec (Aspirin) .... Once Daily 5)  Lisinopril 10 Mg Tabs (Lisinopril) .... Once Daily 6)  Hydrocodone-Acetaminophen 5-500 Mg Tabs (Hydrocodone-Acetaminophen) .... Take 1 Tab Four Times A Day As Needed For Pain 7)  Benzonatate 200 Mg Caps (Benzonatate) .... Take 1 Tab Three Times A Day As Needed 8)  Nitroglycerin 0.4 Mg/hr Pt24 (Nitroglycerin) .... As Needed 9)  Zetia 10 Mg Tabs (Ezetimibe) .... Once Daily 10)  Cozaar 100 Mg Tabs (Losartan Potassium) .... Two Times A Day 11)  Diazepam 5 Mg Tabs (Diazepam) .... Take 1 Tab Up To Three Times A Day Daily As Needed 12)  Skelaxin 800 Mg Tabs (Metaxalone) .[name].. 1 By Mouth Three Times A Day As Needed Muscle Spasm / Back Pain 13)  Tramadol Hcl 50 Mg Tabs (Tramadol Hcl) .... Take 1 Tab Four Times A Day As Needed For Pain 14)  Zolpidem Tartrate 10 Mg Tabs (Zolpidem Tartrate) .[name].. 1 By Mouth At Bedtime As Needed Insomnia 15)  Promethazine Hcl 25 Mg Tabs (Promethazine Hcl) .... As Needed For Nausea 16)  Proair Hfa 108 (90 Base) Mcg/act  Aers (Albuterol Sulfate) .... 2 Inh Q4h As Needed Shortness of Breath  Allergies: 1)  ! Codeine 2)  ! Penicillin  Past History:  Past Medical History: Last updated: 10/31/2008 MI/ 1995 (ICD-410.90) VENTRICULAR TACHYCARDIA (ICD-427.1) ATRIAL FIBRILLATION WITH RAPID VENTRICULAR RESPONSE  (ICD-427.31) PACEMAKER, PERMANENT (ICD-V45.01) CHF/ CLASS I-II (ICD-428.0)  (26% EF), has AICD CAD, UNSPECIFIED SITE (ICD-414.00) CARDIOMYOPATHY, ISCHEMIC (ICD-414.8) HYPERLIPIDEMIA (ICD-272.4) COPD, MILD (ICD-496) DYSPHAGIA (ICD-787.29) ALLERGIC RHINITIS (ICD-477.9)   Cards = Sharrell Ku GI = Leone Payor  Past Surgical History: Last updated: 10/31/2008 Defibrillator-AICD (Medtronic) PTCA-Stent 1998 RTC repair, R Spirometry: 08/25/2008: FEV1% = 64, FEV1 = 89 % predicted  Review of Systems  The patient denies chest pain, syncope, dyspnea on exertion, and peripheral edema.    Vital Signs:  Patient profile:   65 year old male Height:      63 inches Weight:      170 pounds BMI:     30.22 Pulse rate:   70 / minute BP sitting:   130 / 80  (right arm)  Vitals Entered By: Laurance Flatten CMA (August 23, 2009 10:21 AM)  Physical Exam  General:  Well-developed,well-nourished,in no acute distress; alert,appropriate and cooperative throughout examination Head:  Normocephalic and atraumatic without obvious abnormalities. No apparent alopecia or balding. Eyes:  vision grossly intact.   Mouth:  Oral mucosa and oropharynx without lesions or exudates.  Teeth in good repair. Neck:  No deformities, masses, or tenderness noted. Chest Wall:  AICD left chest wall, infraclavicular Lungs:  normal respiratory effort, no intercostal retractions, and no accessory muscle use.   some decreased BS with rare wheezing Heart:  Regular rate and rhythm; no murmurs, rubs,  or bruits. Abdomen:  Soft, nontender and mildly distended. No masses, hepatosplenomegaly or hernias noted. Normal bowel sounds. Msk:  Back normal, normal gait. Muscle strength and tone normal. Pulses:  pulses normal in all 4 extremities Extremities:  No clubbing or cyanosis. Neurologic:  Alert and oriented x 3.    ICD Specifications Following MD:  Lewayne Bunting, MD     ICD Vendor:  Medtronic     ICD Model Number:  7232     ICD Serial  Number:  [id number] ICD DOI:  08/26/2002     ICD Implanting MD:  Lewayne Bunting, MD  Lead 1:    Location: RV     DOI: 08/26/2002     Model #: [id number]     Serial #: [id number]     Status: active  Indications::  ICM, VT   ICD Follow Up Battery Voltage:  2.93 V     Charge Time:  8.62 seconds     Underlying rhythm:  SR   ICD Device Measurements Right Ventricle:  Amplitude: 5.8 mV, Impedance: 472 ohms, Threshold: 1.0 V at 0.3 msec Shock Impedance: 52/62 ohms   Episodes MS Episodes:  0     Shock:  0     ATP:  0     Nonsustained:  0     Ventricular Pacing:  <0.1%  Brady Parameters Mode VVI     Lower Rate Limit:  40      Tachy Zones VF:  200     VT:  182-200     Next Remote Date:  11/23/2009     Tech Comments:  6 NST EPISODES--LONGEST WAS 10 BEATS.  NORMAL DEVICE FUNCTION.  NO CHANGES MADE.  CARELINK CHECK 11-23-09.  Vella Kohler  August 23, 2009 10:34 AM MD Comments:  Agree with above.  Impression & Recommendations:  Problem # 1:  VENTRICULAR TACHYCARDIA (ICD-427.1) His rhythm remains stable.  Will continue current meds. His updated medication list for this problem includes:    Carvedilol 12.5 Mg Tabs (Carvedilol) .[name]... Take 1 tab two times a day    Aspirin Ec 325 Mg Tbec (Aspirin) ..... Once daily    Lisinopril 10 Mg Tabs (Lisinopril) ..... Once daily    Nitroglycerin 0.4 Mg/hr Pt24 (Nitroglycerin) .[name]... As needed  Problem # 2:  AUTOMATIC IMPLANTABLE CARDIAC DEFIBRILLATOR SITU (ICD-V45.02) His device is working normally.  Will recheck in several months.  Problem # 3:  ATRIAL FIBRILLATION WITH RAPID VENTRICULAR RESPONSE (ICD-427.31) He still refuses coumadin.  He will remain on ASA. His updated medication list for this problem includes:    Carvedilol 12.5 Mg Tabs (Carvedilol) .[name]... Take 1 tab two times a day    Aspirin Ec 325 Mg Tbec (Aspirin) ..... Once daily  Patient Instructions: 1)  Your physician recommends that you schedule a follow-up appointment in: 12 months with Dr  [name] Prescriptions: NITROGLYCERIN 0.4 MG/HR PT24 (NITROGLYCERIN) as needed  #25 x prn   Entered by:   Dennis Bast, RN, BSN   Authorized by:   Laren Boom, MD, [facility]   Signed by:   Dennis Bast, RN, BSN on 08/23/2009   Method used:   Electronically to        [facility] [address] (retail)       [id number]       [place], [place]  [address]       Ph: [phone]       Fax: [phone]  RxID:   [id number]

## 2010-03-08 NOTE — Letter (Signed)
Summary: Generic Letter  [facility]  [address] [place], [place] [address]   Phone: [phone]  Fax: [phone]    02/16/2009  TIMUR NIBERT [address] [place], [place]  [address]  To WHOM IT MAY CONCERN,   Mr. Carmer and I discussed a medical exemption from jury duty today, and I think that is appropriate.  He has coronary disease, congestive heart failure, COPD, ruptured disks and some chronic pain - it is not in his best interests to sit all day for jury duty and realistically could make him do poorly.      Sincerely,   Hannah Beat MD

## 2010-03-08 NOTE — Progress Notes (Signed)
Summary: Remus Loffler  Phone Note Refill Request Message from:  Fax from Pharmacy on December 14, 2009 3:09 PM  Refills Requested: Medication #1:  ZOLPIDEM TARTRATE 10 MG TABS 1 by mouth at bedtime as needed insomnia   Supply Requested: 1 month   [facility]   Method Requested: Telephone to Pharmacy Initial call taken by: Benny Lennert CMA  Dull),  December 14, 2009 3:10 PM  Follow-up for Phone Call        i believe already done.  paper signed. Hannah Beat MD  December 14, 2009 3:46 PM

## 2010-03-08 NOTE — Progress Notes (Signed)
Summary: ? UTI  Phone Note Call from Patient Call back at [phone]   Caller: Patient Summary of Call: Pt was seen yesterday.  When he left the office and got home he noticed that there was blood in his urine.  He is having some burning when he urinates, nothing drastic.  He is taking cipro for a prophylactic prior to gall baldder surgery.  He is asking if he should be checked for a UTI. Initial call taken by: Lowella Petties CMA, AAMA,  January 23, 2010 2:39 PM  Follow-up for Phone Call        Cipro should cover 99% of UTI's. Nothing further needs to be done.  f/u next week if not improving or dramatically worse. Hannah Beat MD  January 23, 2010 2:43 PM   Additional Follow-up for Phone Call Additional follow up Details #1::        Patient advised.Consuello Masse CMA   Additional Follow-up by: Benny Lennert CMA Duncan Dull),  January 23, 2010 2:46 PM

## 2010-03-08 NOTE — Medication Information (Signed)
Summary: Ambien Approved  Ambien Approved   Imported By: Maryln Gottron 12/29/2009 15:20:56  _____________________________________________________________________  External Attachment:    Type:   Image     Comment:   External Document

## 2010-03-08 NOTE — Progress Notes (Signed)
Summary: Question about pacemaker check  Phone Note Call from Patient Call back at [phone]   Caller: Patient Summary of Call: Pt have question about having pacemaker checked Initial call taken by: Judie Grieve,  February 09, 2010 8:34 AM  Follow-up for Phone Call        Patient to send remote transmission today and has a reminder appt. for April. Follow-up by: Altha Harm, LPN,  February 09, 2010 9:06 AM

## 2010-03-08 NOTE — Progress Notes (Signed)
Summary: still having breathing problems  Phone Note Call from Patient Call back at [phone]   Caller: Patient Call For: Hannah Beat MD Summary of Call: Pt states he is still having respiratory  problems.  Has finished prednisone, which didnt help.  He thinks he has a pneumonia that is not showing up on x-ray.  He is requesting that avalox be [facility] to [facility].  If that doesnt help we will want to be referred to a specialist. Initial call taken by: Lowella Petties CMA,  September 18, 2009 2:51 PM  Follow-up for Phone Call        I think that is reasonable -- COPD.  If not cleared after broader antibiotics, pulmonary evaluation would be appropriate.  Avelox 400 mg, 1 by mouth q day x 10 days, #10, 0 refills call in Follow-up by: Hannah Beat MD,  September 18, 2009 3:03 PM  Additional Follow-up for Phone Call Additional follow up Details #1::        Phone Call Completed Additional Follow-up by: Benny Lennert CMA Duncan Dull),  September 18, 2009 3:06 PM    New/Updated Medications: AVELOX 400 MG TABS (MOXIFLOXACIN HCL) take one tablet daily for 10 days Prescriptions: AVELOX 400 MG TABS (MOXIFLOXACIN HCL) take one tablet daily for 10 days  #10 x 0   Entered by:   Benny Lennert CMA (AAMA)   Authorized by:   Hannah Beat MD   Signed by:   Benny Lennert CMA (AAMA) on 09/18/2009   Method used:   [facility]        [facility] [facility]ail)       [address]       [place], [place]  [address]       Ph: [phone] or [phone]       Fax: [phone]   RxID:   [id number]

## 2010-03-08 NOTE — Progress Notes (Signed)
Summary: [name]  Phone Note Refill Request Message from:  [employer] on December 14, 2009 11:04 AM  Refills Requested: Medication #1:  SKELAXIN 800 MG TABS 1 by mouth three times a day as needed muscle spasm / back pain   Supply Requested: [facility] month [facility]   Method Requested: Electronic Initial call taken by: Benny Lennert CMA Duncan Dull),  December 14, 2009 11:04 AM  Follow-up for Phone Call        i sent to wrong pharmacy, can you send to [facility] and call [facility] to let them know about the mistake.  Hannah Beat MD  December 14, 2009 11:16 AM   Additional Follow-up for Phone Call Additional follow up Details #1::        [facility] Additional Follow-up by: Benny Lennert CMA Duncan Dull),  December 14, 2009 11:36 AM    Prescriptions: SKELAXIN 800 MG TABS (METAXALONE) 1 by mouth three times a day as needed muscle spasm / back pain  #50 x 5   Entered by:   Benny Lennert CMA (AAMA)   Authorized by:   Hannah Beat MD   Signed by:   Benny Lennert CMA (AAMA) on 12/14/2009   Method used:   [facility]        [facility] [id number]ail[facility]       [address]       [place], [place]  [address]       Ph: [phone] or [phone]       Fax: [phone]   RxID:   [id number] SKELAXIN 800 MG TABS (METAXALONE) 1 by mouth three times a day as needed muscle spasm / back pain  #50 x 5   Entered and Authorized by:   Hannah Beat MD   Signed by:   Hannah Beat MD on 12/14/2009   Method used:   Electronically to        [facility] [address] (retail)       [phone]       [place], [place]  [address]       Ph: [phone]       Fax: [phone]   RxID:   [id number]

## 2010-03-08 NOTE — Progress Notes (Signed)
Summary: refill request for tramadol  Phone Note Refill Request Message from:  Fax from Pharmacy  Refills Requested: Medication #1:  TRAMADOL HCL 50 MG TABS TAke 1 tab four times a day as needed for pain   Last Refilled: 06/15/2009 [facility]axed request from [facility] [address], [phone].  Initial call taken by: Lowella Petties CMA,  August 23, 2009 9:23 AM    Prescriptions: TRAMADOL HCL 50 MG TABS (TRAMADOL HCL) TAke 1 tab four times a day as needed for pain  #60 x 5   Entered and Authorized by:   Hannah Beat MD   Signed by:   Hannah Beat MD on 08/23/2009   Method used:   Electronically to        [facility] [address] (retail)       [phone]       [place], [place]  [address]       Ph: [phone]       Fax: [phone]   RxID:   [id number]

## 2010-03-08 NOTE — Assessment & Plan Note (Signed)
Summary: COUGH,CONGESTION,DRAINAGE/CLE   Vital Signs:  Patient profile:   65 year old male Height:      63 inches Weight:      167.0 pounds BMI:     29.69 Temp:     98.2 degrees F oral Pulse rate:   72 / minute Pulse rhythm:   regular BP sitting:   120 / 84  (left arm) Cuff size:   regular  Vitals Entered By: Benny Lennert CMA Duncan Dull) (July 06, 2009 12:24 PM)  History of Present Illness: 3 1/2 weeks  LLL PNA  mild R LL crackles  Acute Visit History:      The patient complains of cough.  These symptoms began 4 weeks ago.  He denies earache, fever, headache, nasal discharge, sinus problems, and sore throat.        The patient notes wheezing and shortness of breath.  The character of the cough is described as productive.  He has a history of COPD.  There is no history of sleep interference, respiratory retractions, tachypnea, cyanosis, or interference with oral intake associated with his cough.        Urine output has been normal.  He is tolerating clear liquids.        Allergies: 1)  ! Codeine 2)  ! Penicillin  Past History:  Past medical, surgical, family and social histories (including risk factors) reviewed, and no changes noted (except as noted below).  Past Medical History: Reviewed history from 10/31/2008 and no changes required. MI/ 1995 (ICD-410.90) VENTRICULAR TACHYCARDIA (ICD-427.1) ATRIAL FIBRILLATION WITH RAPID VENTRICULAR RESPONSE (ICD-427.31) PACEMAKER, PERMANENT (ICD-V45.01) CHF/ CLASS I-II (ICD-428.0)  (26% EF), has AICD CAD, UNSPECIFIED SITE (ICD-414.00) CARDIOMYOPATHY, ISCHEMIC (ICD-414.8) HYPERLIPIDEMIA (ICD-272.4) COPD, MILD (ICD-496) DYSPHAGIA (ICD-787.29) ALLERGIC RHINITIS (ICD-477.9)   Cards = Sharrell Ku GI = Leone Payor  Past Surgical History: Reviewed history from 10/31/2008 and no changes required. Defibrillator-AICD (Medtronic) PTCA-Stent 1998 RTC repair, R Spirometry: 08/25/2008: FEV1% = 64, FEV1 = 89 % predicted  Family  History: Reviewed history from 08/22/2008 and no changes required. Family History of Diabetes: Father No FH of Colon Cancer:  Social History: Reviewed history from 06/21/2008 and no changes required. Occupation: Retired, Presenter, broadcasting Patient currently smokes. (150 + PACK YEARS) Alcohol Use - no Daily Caffeine Use -1 Illicit Drug Use - no Married, no children  Review of Systems      See HPI General:  See HPI. Resp:  Complains of chest discomfort, cough, sputum productive, and wheezing.  Physical Exam  General:  Well-developed,well-nourished,in no acute distress; alert,appropriate and cooperative throughout examination Head:  Normocephalic and atraumatic without obvious abnormalities. No apparent alopecia or balding. Ears:  External ear exam shows no significant lesions or deformities.  Otoscopic examination reveals clear canals, tympanic membranes are intact bilaterally without bulging, retraction, inflammation or discharge. Hearing is grossly normal bilaterally. Nose:  no external deformity.   Mouth:  Oral mucosa and oropharynx without lesions or exudates.  Teeth in good repair. Neck:  No deformities, masses, or tenderness noted. Lungs:  normal respiratory effort, no intercostal retractions, and no accessory muscle use.   R > L crackles at bases Heart:  Regular rate and rhythm; no murmurs, rubs,  or bruits. Extremities:  No clubbing, cyanosis, edema, or deformity noted with normal full range of motion of all joints.   Neurologic:  alert & oriented X3 and gait normal.   Cervical Nodes:  No lymphadenopathy noted Psych:  Cognition and judgment appear intact. Alert and cooperative with normal attention  span and concentration. No apparent delusions, illusions, hallucinations   Impression & Recommendations:  Problem # 1:  PNEUMONIA DUE TO OTHER SPECIFIED BACTERIA (ICD-482.89) Assessment New rewritten for multiple maintenance labs  His updated medication list for this problem  includes:    Doxycycline Hyclate 100 Mg Caps (Doxycycline hyclate) .[name]... Take 1 tab twice a day  Instructed patient to complete antibiotics, and call for worsened shortness of breath or new symptoms.   Problem # 2:  COPD, MILD (ICD-496) Assessment: New  His updated medication list for this problem includes:    Proair Hfa 108 (90 Base) Mcg/act Aers (Albuterol sulfate) .[name]... 2 inh q4h as needed shortness of breath  Complete Medication List: 1)  Clotrimazole 10 Mg Troc (Clotrimazole) .... Take 1 tab four times a day 2)  Celebrex 200 Mg Caps (Celecoxib) .... Take 1 capsule as needed for pain 3)  Carvedilol 12.5 Mg Tabs (Carvedilol) .... Take 1 tab two times a day 4)  Aspirin Ec 325 Mg Tbec (Aspirin) .... Once daily 5)  Lisinopril 10 Mg Tabs (Lisinopril) .... Once daily 6)  Hydrocodone-acetaminophen 5-500 Mg Tabs (Hydrocodone-acetaminophen) .... Take 1 tab four times a day as needed for pain 7)  Benzonatate 200 Mg Caps (Benzonatate) .... Take 1 tab three times a day as needed 8)  Nitroglycerin 0.4 Mg/hr Pt24 (Nitroglycerin) .... As needed 9)  Zetia 10 Mg Tabs (Ezetimibe) .... Once daily 10)  Cozaar 100 Mg Tabs (Losartan potassium) .... Two times a day 11)  Diazepam 5 Mg Tabs (Diazepam) .... Take 1 tab up to three times a day daily as needed 12)  Skelaxin 800 Mg Tabs (Metaxalone) .... Three times a day 13)  Tramadol Hcl 50 Mg Tabs (Tramadol hcl) .... Take 1 tab four times a day as needed for pain 14)  Zolpidem Tartrate 10 Mg Tabs (Zolpidem tartrate) .[name].. 1 by mouth at bedtime as needed insomnia 15)  Promethazine Hcl 25 Mg Tabs (Promethazine hcl) .... As needed for nausea 16)  Proair Hfa 108 (90 Base) Mcg/act Aers (Albuterol sulfate) .... 2 inh q4h as needed shortness of breath 17)  Triamcinolone Acetonide 0.1 % Crea (Triamcinolone acetonide) .... Apply bid to affected area 18)  Doxycycline Hyclate 100 Mg Caps (Doxycycline hyclate) .... Take 1 tab twice a day 19)  Tessalon 200 Mg Caps  (Benzonatate) .... Take one capsule by mouth three times a day as needed for cough  Patient Instructions: 1)  PNEUMONIA 2)  -Viral or baterial infections of the lung. Fever, cough, chest pain, shortness of breath, phlegm production, fatigue are symptoms. 3)  Treatment: 4)  1. Take all medicines 5)  2. Antibiotics  6)  3. Cough suppressants 7)  4. Bronchodilators: an inhaler 8)  5. Expectorant like Guaifenesin (Robitussin, Mucinex) 9)  Fluids and Moisture help: drink lots of fluids 10)  Vaporizier or humidifier in room, shower steam 11)  --help loosen secretions and sooth breathing passages 12)  Elevate head slightly when trying to sleep.  Prescriptions: TESSALON 200 MG CAPS (BENZONATATE) Take one capsule by mouth three times a day as needed for cough  #60 x 1   Entered and Authorized by:   Hannah Beat MD   Signed by:   Hannah Beat MD on 07/06/2009   Method used:   [facility]        [id number]       [address]       [place], [place]  [address]  Ph: [phone] or [phone]       Fax: [phone]   RxID:   [id number] DOXYCYCLINE HYCLATE 100 MG CAPS (DOXYCYCLINE HYCLATE) Take 1 tab twice a day  #20 x 0   Entered and Authorized by:   Hannah Beat MD   Signed by:   Hannah Beat MD on 07/06/2009   Method used:   [facility]        [id number]       [address]       [place], [place]  [address]       Ph: [phone] or [phone]       Fax: [phone]   RxID:   [id number] PROAIR HFA 108 (90 BASE) MCG/ACT  AERS (ALBUTEROL SULFATE) 2 inh q4h as needed shortness of breath  #1 x 1   Entered and Authorized by:   Hannah Beat MD   Signed by:   Hannah Beat MD on 07/06/2009   Method used:   [facility]        [facility] ([facility]ail)       [address]       [place], [place]  [address]       Ph: [phone] or [phone]       Fax: [phone]   RxID:    [id number]   Current Allergies (reviewed today): ! CODEINE ! PENICILLIN

## 2010-03-08 NOTE — Progress Notes (Signed)
Summary: medicine not helping pneumonia  Phone Note Call from Patient Call back at [phone]   Caller: Patient Call For: Dr. Ermalene Searing Summary of Call: Pt was given doxycycline on 6/02 for pneumonia and pt doesnt think that is helping.  Still has a lot of congestion and drainage.  Has bad coughing spells.  Had fever of 101 last night.  [facility]. Initial call taken by: Lowella Petties CMA,  July 14, 2009 8:07 AM  Follow-up for Phone Call        Call to find out symptoms...any SOB etc? Likely needs to be seen again today.  Follow-up by: Kerby Nora MD,  July 14, 2009 9:15 AM  Additional Follow-up for Phone Call Additional follow up Details #1::        Spoke with patient rib pain at bottom of lungs on both sides,Little short of breathe, and coughing spells.Consuello Masse CMA   Patient says he had the same thing before and took avelox and it cleared it right up Additional Follow-up by: Benny Lennert CMA Duncan Dull),  July 14, 2009 9:19 AM    Additional Follow-up for Phone Call Additional follow up Details #2::    Will send in avelox, but in no improvement in 24-48 hours. Call on calll MD or make appt to be seen on Monday. Go to ER if sevcere SOB.   Follow-up by: Kerby Nora MD,  July 14, 2009 9:31 AM  Additional Follow-up for Phone Call Additional follow up Details #3:: Details for Additional Follow-up Action Taken: patient advised.Consuello Masse CMA  Additional Follow-up by: Benny Lennert CMA Duncan Dull),  July 14, 2009 9:34 AM  New/Updated Medications: AVELOX 400 MG TABS (MOXIFLOXACIN HCL) 1 tab by mouth daily x 5 days Prescriptions: AVELOX 400 MG TABS (MOXIFLOXACIN HCL) 1 tab by mouth daily x 5 days  #5 x 0   Entered and Authorized by:   Kerby Nora MD   Signed by:   Kerby Nora MD on 07/14/2009   Method used:   [facility]        [facility] (retail)       [address]       [place], [place]  [address]       Ph: [phone] or  [phone]       Fax: [phone]   RxID:   [id number]

## 2010-03-08 NOTE — Assessment & Plan Note (Signed)
Summary: RIB PAIN/CLE   Vital Signs:  Patient profile:   65 year old male Height:      63 inches Weight:      172.50 pounds BMI:     30.67 Temp:     98.2 degrees F oral Pulse rate:   76 / minute Pulse rhythm:   regular BP sitting:   162 / 90  (left arm) Cuff size:   large  Vitals Entered By: Delilah Shan CMA  Dull) (January 04, 2010 3:07 PM) CC: Rib pain   History of Present Illness: Pain on R side of chest along a rib.  "It feels like it's on the skin and a little deeper."  No rash.  Going on for weeks.  Some of the time it is worse than others, not worse with cough or deep breath.  Worse if patient leans a certain way.  Vicodin helps a little.  No sputum.  No cough at all. No fevers.  Per patient, breathing is much better than before.  No exertional symptoms.  pain can happen at rest.  Can get up and walk w/o pain.  No known trauma.  Pain had been better sometimes with manual compression.   Allergies: 1)  ! Codeine 2)  ! Penicillin 3)  ! * Statin Intolerance  Review of Systems       See HPI.  Otherwise negative.    Physical Exam  General:  GEN: nad, alert and oriented HEENT: mucous membranes moist NECK: supple w/o LA CV: rrr. pacer in place PULM: ctab, no inc wob but posterior/midaxillary lower ribs mildly tender to palpation  ABD: soft, +bs, RUQ mildly tender to palpation w/o rebound EXT: no edema SKIN: no acute rash    Impression & Recommendations:  Problem # 1:  RUQ PAIN (ICD-789.01) I don't see any reason to suspect cardiac source.  CXR w/o acute change and will send for LFTs and u/s.  See below.  >45min spent with patient face to face.  Orders: Radiology Referral (Radiology)  Problem # 2:  RIB PAIN (ICD-786.50) No cause seen.  If cause not identified with work up above, then consider ortho eval for possible radicular symptoms.  He agrees.  Orders: T-2 View CXR (71020TC)  Complete Medication List: 1)  Clotrimazole 10 Mg Troc (Clotrimazole) .... As  needed 2)  Celebrex 200 Mg Caps (Celecoxib) .... Take 1 capsule as needed for pain 3)  Carvedilol 12.5 Mg Tabs (Carvedilol) .... Take 1 tab two times a day 4)  Aspirin Ec 325 Mg Tbec (Aspirin) .... Once daily 5)  Hydrocodone-acetaminophen 5-500 Mg Tabs (Hydrocodone-acetaminophen) .... Take 1 tab four times a day as needed for pain 6)  Benzonatate 200 Mg Caps (Benzonatate) .... Take 1 tab three times a day as needed 7)  Nitrostat 0.4 Mg Subl (Nitroglycerin) .[name].. 1 by mouth as needed as direcyed 8)  Zetia 10 Mg Tabs (Ezetimibe) .... Once daily 9)  Cozaar 100 Mg Tabs (Losartan potassium) .... Two times a day 10)  Diazepam 5 Mg Tabs (Diazepam) .... Take 1 tab up to three times a day daily as needed 11)  Skelaxin 800 Mg Tabs (Metaxalone) .[name].. 1 by mouth three times a day as needed muscle spasm / back pain 12)  Tramadol Hcl 50 Mg Tabs (Tramadol hcl) .... Take 1 tab four times a day as needed for pain 13)  Zolpidem Tartrate 10 Mg Tabs (Zolpidem tartrate) .[name].. 1 by mouth at bedtime as needed insomnia 14)  Promethazine Hcl 25 Mg  Tabs (Promethazine hcl) .... As needed for nausea 15)  Proair Hfa 108 (90 Base) Mcg/act Aers (Albuterol sulfate) .... 2 inh q4h as needed shortness of breath 16)  Lisinopril 10 Mg Tabs (Lisinopril) .... Take 1 tab by mouth daily 17)  Prednisone 10 Mg Tabs (Prednisone) .... Take 1 tablet by mouth once a day 18)  Prednisone 20 Mg Tabs (Prednisone) .... 2 by mouth x3 days,  1 by mouth x3 days,  1/2 by mouth x4 days, 19)  Zithromax 250 Mg Tabs (Azithromycin) .... 2po today and then 1 by mouth once daily for 4 days.  Patient Instructions: 1)  See Shirlee Limerick about your referral before your leave today (or tomorrow when you come back). 2)  labs tomorrow- hepatic function panel, amylase and lipase.  dx 789.01 3)  If you don't get better and if we don't have an answer after these tests, then we'll talk about options.     Orders Added: 1)  Est. Patient Level IV [81191] 2)  Radiology  Referral [Radiology] 3)  T-2 View CXR [71020TC]    Current Allergies (reviewed today): ! CODEINE ! PENICILLIN ! * STATIN INTOLERANCE

## 2010-03-28 ENCOUNTER — Other Ambulatory Visit: Payer: Self-pay | Admitting: Family Medicine

## 2010-03-28 ENCOUNTER — Ambulatory Visit (INDEPENDENT_AMBULATORY_CARE_PROVIDER_SITE_OTHER): Payer: Self-pay | Admitting: Family Medicine

## 2010-03-28 ENCOUNTER — Encounter: Payer: Self-pay | Admitting: Family Medicine

## 2010-03-28 ENCOUNTER — Ambulatory Visit (INDEPENDENT_AMBULATORY_CARE_PROVIDER_SITE_OTHER)
Admission: RE | Admit: 2010-03-28 | Discharge: 2010-03-28 | Disposition: A | Discharge status: Home / Self Care | Payer: Federal, State, Local not specified - PPO | Source: Ambulatory Visit | Attending: Family Medicine | Admitting: Family Medicine

## 2010-03-28 DIAGNOSIS — R0602 Shortness of breath: Secondary | ICD-10-CM

## 2010-03-28 DIAGNOSIS — J449 Chronic obstructive pulmonary disease, unspecified: Secondary | ICD-10-CM

## 2010-03-28 DIAGNOSIS — J209 Acute bronchitis, unspecified: Secondary | ICD-10-CM

## 2010-03-28 NOTE — Letter (Signed)
Summary: [facility] - Office Visit  [facility] - Office Visit   Imported By: Marylou Mccoy 03/21/2010 15:52:07  _____________________________________________________________________  External Attachment:    Type:   Image     Comment:   External Document

## 2010-04-03 NOTE — Assessment & Plan Note (Signed)
Summary: ?URI/CLE PT WANTED TO WAIT FOR DR Flossie Wexler MEDICARE/BCBS   Vital Signs:  Patient profile:   65 year old [name] Height:      63 inches Weight:      171.25 pounds BMI:     30.45 Temp:     97.5 degrees F oral Pulse rate:   60 / minute Pulse rhythm:   regular BP sitting:   130 / 90  (left arm) Cuff size:   regular  Vitals Entered ByMelody Comas (March 28, 2010 10:37 AM) CC: URI   History of Present Illness: 65 year old [name] smoker:  COPD exacerbation and bronchitis: Hard to get a decent breath Easily getting short of breath. wheezing a lot coughing, trying.  Not moving around well cough - not productive  minimal drainage no st, earache  99 fever or so.    Allergies: 1)  ! Codeine 2)  ! Penicillin 3)  ! * Statin Intolerance  Past History:  Past medical, surgical, family and social histories (including risk factors) reviewed, and no changes noted (except as noted below).  Past Medical History: Reviewed history from 11/30/2009 and no changes required. MI/ 1995 (ICD-410.90) VENTRICULAR TACHYCARDIA (ICD-427.1) ATRIAL FIBRILLATION WITH RAPID VENTRICULAR RESPONSE (ICD-427.31) PACEMAKER, PERMANENT (ICD-V45.01) CHF/ CLASS I-II (ICD-428.0)  (26% EF), has AICD CAD, UNSPECIFIED SITE (ICD-414.00) CARDIOMYOPATHY, ISCHEMIC (ICD-414.8) HYPERLIPIDEMIAConnie intolerance to statins. Myopathies. COPD, MILD (ICD-496) DYSPHAGIA (ICD-787.29) ALLERGIC RHINITIS (ICD-477.9)  Past Surgical History: Reviewed history from 10/31/2008 and no changes required. Defibrillator-AICD ([employer]tronic) PTCA-Stent 1998 RTC repair, R Spirometry: 08/25/2008: FEV1% = 64, FEV1 = 89 % predicted  Family History: Reviewed history from 08/22/2008 and no changes required. Family History of Diabetes: Father No FH of Colon Cancer:  Social History: Reviewed history from 06/21/2008 and no changes required. Occupation: Retired, Presenter, broadcasting Patient currently smokes. (150 + PACK  YEARS) Alcohol Use - no Daily Caffeine Use -1 Illicit Drug Use - no Married, no children  Review of Systems      See HPI General:  Complains of chills, fatigue, and fever. CV:  Denies chest pain or discomfort. Resp:  Complains of cough, shortness of breath, and wheezing; denies sputum productive.  Physical Exam  General:  GEN: nad, alert and oriented HEENT: mucous membranes moist, TM w/o erythema, nasal epithelium injected, OP with cobblestoning NECK: supple w/o LA CV: rrr. PULM: diffuse decreased BS, wheezing without crackles ABD: soft, +bs EXT: no edema  max sinus minimally tender to palpation bilaterally   Impression & Recommendations:  Problem # 1:  COPD, MILD (ICD-496) Assessment Deteriorated COPD exac potential infiltrate on CXR wheezing  ABX, prednisone ventolin as needed   His updated medication list for this problem includes:    Ventolin Hfa 108 (90 Base) Mcg/act Aers (Albuterol sulfate) .[name]... 1-2 puffs q4h as needed for cough, wheeze (200 dose mdi)  Problem # 2:  BRONCHITIS- ACUTE (ICD-466.0) Assessment: New  His updated medication list for this problem includes:    Ventolin Hfa 108 (90 Base) Mcg/act Aers (Albuterol sulfate) .[name]... 1-2 puffs q4h as needed for cough, wheeze (200 dose mdi)    Tessalon 200 Mg Caps (Benzonatate) .[name]... 1 by mouth three times a day as needed for cough    Doxycycline Hyclate 100 Mg Caps (Doxycycline hyclate) .[name]... Take 1 tab twice a day  Problem # 3:  DYSPNEA (ICD-786.05) Chest X-ray, 2 Views, PA and Lateral Indication: sob Findings: cardiomegaly. AICD in place. RML with some increased opacification near R cardiac border   Orders: T-2  View CXR (71020TC)  Complete Medication List: 1)  Clotrimazole 10 Mg Troc (Clotrimazole) .... As needed 2)  Celebrex 200 Mg Caps (Celecoxib) .... Take 1 capsule as needed for pain 3)  Carvedilol 12.5 Mg Tabs (Carvedilol) .... Take 1 tab two times a day 4)  Aspirin Ec 325 Mg Tbec (Aspirin) ....  Once daily 5)  Hydrocodone-acetaminophen 5-500 Mg Tabs (Hydrocodone-acetaminophen) .... Take 1 tab four times a day as needed for pain 6)  Nitrostat 0.4 Mg Subl (Nitroglycerin) .[name].. 1 by mouth as needed as direcyed 7)  Zetia 10 Mg Tabs (Ezetimibe) .... Once daily 8)  Cozaar 100 Mg Tabs (Losartan potassium) .... Two times a day 9)  Diazepam 5 Mg Tabs (Diazepam) .... Take 1 tab up to three times a day daily as needed 10)  Skelaxin 800 Mg Tabs (Metaxalone) .[name].. 1 by mouth three times a day as needed muscle spasm / back pain 11)  Tramadol Hcl 50 Mg Tabs (Tramadol hcl) .... Take 1 tab four times a day as needed for pain 12)  Ambien Cr 12.5 Mg Cr-tabs (Zolpidem tartrate) .[name].. 1 by mouth qhs 13)  Promethazine Hcl 25 Mg Tabs (Promethazine hcl) .[name].. 1 by mouth every 8 hours as needed for nausea 14)  Ventolin Hfa 108 (90 Base) Mcg/act Aers (Albuterol sulfate) .[name].. 1-2 puffs q4h as needed for cough, wheeze (200 dose mdi) 15)  Lisinopril 10 Mg Tabs (Lisinopril) .... Take 1 tab by mouth daily 16)  Tessalon 200 Mg Caps (Benzonatate) .[name].. 1 by mouth three times a day as needed for cough 17)  Doxycycline Hyclate 100 Mg Caps (Doxycycline hyclate) .... Take 1 tab twice a day 18)  Prednisone 20 Mg Tabs (Prednisone) .... 2 tabs by mouth for 5 days, then 1 by mouth x 4 days Prescriptions: PREDNISONE 20 MG TABS (PREDNISONE) 2 tabs by mouth for 5 days, then 1 by mouth x 4 days  #14 x 0   Entered and Authorized by:   Hannah Beat MD   Signed by:   Hannah Beat MD on 03/28/2010   Method used:   Print then Give to Patient   RxID:   [id number] DOXYCYCLINE HYCLATE 100 MG CAPS (DOXYCYCLINE HYCLATE) Take 1 tab twice a day  #20 x 0   Entered and Authorized by:   Hannah Beat MD   Signed by:   Hannah Beat MD on 03/28/2010   Method used:   Print then Give to Patient   RxID:   [id number] AMBIEN CR 12.5 MG CR-TABS (ZOLPIDEM TARTRATE) 1 by mouth qhs  #30 x 5   Entered and Authorized by:   Hannah Beat MD   Signed by:   Hannah Beat MD on 03/28/2010   Method used:   Print then Give to Patient   RxID:   [id number]    Orders Added: 1)  T-2 View CXR [71020TC] 2)  Est. Patient Level IV [64403]    Current Allergies (reviewed today): ! CODEINE ! PENICILLIN ! * STATIN INTOLERANCE

## 2010-04-30 ENCOUNTER — Encounter: Payer: Self-pay | Admitting: Family Medicine

## 2010-05-01 ENCOUNTER — Telehealth: Payer: Self-pay | Admitting: *Deleted

## 2010-05-01 NOTE — Telephone Encounter (Signed)
Prior auth is needed for ambien, form is on your desk. 

## 2010-05-02 ENCOUNTER — Other Ambulatory Visit: Payer: Self-pay | Admitting: *Deleted

## 2010-05-02 ENCOUNTER — Ambulatory Visit (INDEPENDENT_AMBULATORY_CARE_PROVIDER_SITE_OTHER): Payer: Federal, State, Local not specified - PPO | Admitting: Family Medicine

## 2010-05-02 ENCOUNTER — Encounter: Payer: Self-pay | Admitting: Family Medicine

## 2010-05-02 VITALS — BP 120/70 | HR 64 | Temp 97.6°F | Ht 63.0 in | Wt 169.1 lb

## 2010-05-02 DIAGNOSIS — J449 Chronic obstructive pulmonary disease, unspecified: Secondary | ICD-10-CM

## 2010-05-02 DIAGNOSIS — R079 Chest pain, unspecified: Secondary | ICD-10-CM

## 2010-05-02 DIAGNOSIS — J40 Bronchitis, not specified as acute or chronic: Secondary | ICD-10-CM | POA: Insufficient documentation

## 2010-05-02 DIAGNOSIS — I2510 Atherosclerotic heart disease of native coronary artery without angina pectoris: Secondary | ICD-10-CM

## 2010-05-02 MED ORDER — FLUTICASONE-SALMETEROL 250-50 MCG/DOSE IN AEPB: 1.0000 | INHALATION_SPRAY | Freq: Two times a day (BID) | RESPIRATORY_TRACT | Status: DC

## 2010-05-02 MED ORDER — MOXIFLOXACIN HCL 400 MG PO TABS: 400.0000 mg | ORAL_TABLET | Freq: Every day | ORAL | Status: AC

## 2010-05-02 NOTE — Assessment & Plan Note (Signed)
EKG: AICD/pacemaker firing with widened QRS, no ST elevation or depression. Multiple flipped t's.  No prior to compare.  No current chest pain, atypical, but with history may be angina.  Use NTG tabs now -- to ER if worsening chest pain not relieved by NTG  I discussed the case by phone with Dr. [name] who is seeing him next week.

## 2010-05-02 NOTE — Patient Instructions (Signed)
REFERRAL: GO THE THE FRONT ROOM AT THE ENTRANCE OF OUR CLINIC, NEAR CHECK IN. ASK FOR MARION. SHE WILL HELP YOU SET UP YOUR REFERRAL. DATE: TIME:  

## 2010-05-02 NOTE — Progress Notes (Signed)
65 year old male:  Multiple problems.  Primarily, the patient has pain in his upper abdominal area and lower chest area. This is accompanied with rhinorrhea, some wheezing, and a feeling of being short winded. Also having a cough productive of sputum.  What is really working, nitroglycerine tablet - no chest pain, but feeling short winded and some discomfort like described above, and when he takes a nitroglycerin tablet he will have his discomfort resolved. When lying down, propped up with about 15 deg, feels like a stop in his chest. NTG SL alleviates the problem Will last about 15-30 mins on avg Will get better with sitting up.   Lisinopril, having a cough. Interestingly, he is also on angiotensin receptor blocker, and was taking both of these together.  COPD flare, currently not on any regular inhalers, intermittently will use albuterol, continues to smoke, though down from 4 packs a day to 3 or 4 cigarettes a day. He has required prednisone several times in the last 2 years. Start Advair ABX  Bloating, nausea, vomitting. A couple of scarmbled eggs and a biscuit. He is also had some mild diarrhea, but all this has been done within last week or so with the onset of his acute illness. Right lateral pain. Relatively mild. He does have a sensation of being bloated. 02/10/2010 gallbladder removal

## 2010-05-02 NOTE — Assessment & Plan Note (Signed)
COPD flare with bronchitis Use albuterol Add advair for copd ABX  Recheck in a few months

## 2010-05-02 NOTE — Telephone Encounter (Signed)
Ok to refill x 1 year 

## 2010-05-03 MED ORDER — METAXALONE 800 MG PO TABS: 800.0000 mg | ORAL_TABLET | Freq: Three times a day (TID) | ORAL | Status: DC

## 2010-05-03 NOTE — Telephone Encounter (Signed)
Rx called to pharmacy

## 2010-05-07 ENCOUNTER — Other Ambulatory Visit: Payer: Self-pay | Admitting: Family Medicine

## 2010-05-09 NOTE — Telephone Encounter (Signed)
Prior [place] given for [employer].  Advised pharmacy, doctor is aware.  Letter placed up front for scanning.

## 2010-05-10 ENCOUNTER — Encounter: Payer: Self-pay | Admitting: Internal Medicine

## 2010-05-10 ENCOUNTER — Ambulatory Visit (INDEPENDENT_AMBULATORY_CARE_PROVIDER_SITE_OTHER): Payer: Federal, State, Local not specified - PPO | Admitting: Internal Medicine

## 2010-05-10 VITALS — BP 130/90 | HR 60 | Ht 63.0 in | Wt 170.0 lb

## 2010-05-10 DIAGNOSIS — I5023 Acute on chronic systolic (congestive) heart failure: Secondary | ICD-10-CM | POA: Insufficient documentation

## 2010-05-10 DIAGNOSIS — I428 Other cardiomyopathies: Secondary | ICD-10-CM

## 2010-05-10 MED ORDER — POTASSIUM CHLORIDE CRYS ER 20 MEQ PO TBCR: 20.0000 meq | EXTENDED_RELEASE_TABLET | Freq: Every day | ORAL | Status: DC

## 2010-05-10 MED ORDER — FUROSEMIDE 40 MG PO TABS: 40.0000 mg | ORAL_TABLET | Freq: Every day | ORAL | Status: DC

## 2010-05-10 NOTE — Assessment & Plan Note (Signed)
His ventricular response does not appear to be well controlled. This may be related to his congestive heart failure symptoms. He will continue his beta blocker. We may consider up titration when we see him back. He continues to refuse anticoagulation.

## 2010-05-10 NOTE — Patient Instructions (Signed)
Your physician recommends that you schedule a follow-up appointment in: 2 months with Dr [name]  Your physician has recommended you make the following change in your medication: start Furosemise 40mg  one daily and Potassium one daily  Your physician recommends that you return for lab work in: in one week for a BMP

## 2010-05-10 NOTE — Assessment & Plan Note (Signed)
The patient appears to have a worsening of his congestive heart failure symptoms. He clearly eats too much sodium. He is not on a diuretic. Today we will start Lasix and potassium. I have stressed the importance of a low salt diet. He will return in a week for blood tests.

## 2010-05-10 NOTE — Progress Notes (Signed)
HPI  [name] returns today for followup. He is a pleasant middle-aged man with an ischemic cardiomyopathy, atrial fibrillation, hypertension, and medical noncompliance. The patient has been unwilling to take Coumadin. Over the last several weeks he has noted increasing shortness of breath. He also noticed difficulty with exertion. He has not had any anginal symptoms. No syncope. He does not feel palpitations. He has had no ICD shocks. No peripheral edema.  Allergies  Allergen Reactions  . Codeine   . Penicillins   . Ace Inhibitors     Cough     Current Outpatient Prescriptions  Medication Sig Dispense Refill  . albuterol (PROVENTIL HFA;VENTOLIN HFA) 108 (90 BASE) MCG/ACT inhaler Inhale 2 puffs into the lungs every 4 (four) hours as needed.        [name] aspirin 325 MG EC tablet Take 325 mg by mouth daily.        . benzonatate (TESSALON) 200 MG capsule Take 200 mg by mouth 3 (three) times daily as needed.        . carvedilol (COREG) 12.5 MG tablet Take 12.5 mg by mouth 2 (two) times daily with a meal.        . celecoxib (CELEBREX) 200 MG capsule Take 200 mg by mouth daily as needed.        . ezetimibe (ZETIA) 10 MG tablet Take 10 mg by mouth daily.        . Fluticasone-Salmeterol (ADVAIR DISKUS) 250-50 MCG/DOSE AEPB Inhale 1 puff into the lungs 2 (two) times daily.  1 each  11  . HYDROcodone-acetaminophen (VICODIN) 5-500 MG per tablet Take 1 tablet by mouth every 6 (six) hours as needed.        [name] losartan (COZAAR) 100 MG tablet Take 100 mg by mouth 2 (two) times daily.        . metaxalone (SKELAXIN) 800 MG tablet Take 1 tablet (800 mg total) by mouth 3 (three) times daily.  90 tablet  11  . moxifloxacin (AVELOX) 400 MG tablet Take 1 tablet (400 mg total) by mouth daily.  10 tablet  0  . nitroGLYCERIN (NITROSTAT) 0.4 MG SL tablet Place 0.4 mg under the tongue every 5 (five) minutes as needed.        . promethazine (PHENERGAN) 25 MG tablet TAKE 1 TABLET BY MOUTH EVERY 8 HOURS AS NEEDED FOR  NAUSEA  30 tablet  1  . traMADol (ULTRAM) 50 MG tablet Take 50 mg by mouth every 6 (six) hours as needed.        . zolpidem (AMBIEN CR) 12.5 MG CR tablet Take 12.5 mg by mouth at bedtime as needed.        . clotrimazole (MYCELEX) 10 MG troche Take 10 mg by mouth as needed.       . diazepam (VALIUM) 5 MG tablet Take 5 mg by mouth every 6 (six) hours as needed.           Past Medical History  Diagnosis Date  . Acute myocardial infarction, unspecified site, episode of care unspecified   . Paroxysmal ventricular tachycardia   . Atrial fibrillation   . Cardiac pacemaker in situ   . Congestive heart failure, unspecified   . CAD (coronary artery disease), autologous vein bypass graft   . Chronic airway obstruction, not elsewhere classified   . Allergic rhinitis, cause unspecified   . Pure hypercholesterolemia     ROS:   All systems reviewed and negative except as noted in the HPI.   Past  Surgical History  Procedure Date  . Cardiac defibrillator placement   . Coronary angioplasty with stent placement 1998  . Rotator cuff repair   . Cholecystectomy, laparoscopic 02/2010     Family History  Problem Relation Age of Onset  . Diabetes Father      History   Social History  . Marital Status: Married    Spouse Name: N/A    Number of Children: 0  . Years of Education: N/A   Occupational History  . 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 Smoking status: Current Everyday Smoker -- 4.0 packs/day for 150 years    Types: Cigarettes  . Smokeless tobacco: Not on file   Comment: NOW DOWN TO 3 CIGS A DAY  . Alcohol Use: No  . Drug Use: No  . Sexually Active: Not on file   Other Topics Concern  . Not on file   Social History Narrative  . No narrative on file     BP 130/90  Pulse 60  Ht 5\' 3"  (1.6 m)  Wt 170 lb (77.111 kg)  BMI 30.11 kg/m2  Physical Exam:  Well appearing NAD HEENT: Unremarkable Neck:  No JVD, no thyromegally Lymphatics:  No  adenopathy Back:  No CVA tenderness Lungs:  Rales in the lower lung fields bilaterally. No wheezes. Well healed ICD incision. HEART:  Iregular rate rhythm, no murmurs, no rubs, no clicks Abd:  Flat, positive bowel sounds, no organomegally, no rebound, no guarding Ext:  2 plus pulses, no edema, no cyanosis, no clubbing Skin:  No rashes no nodules Neuro:  CN II through XII intact, motor grossly intact  DEVICE  Normal device function.  See PaceArt for details.   Assess/Plan:

## 2010-05-10 NOTE — Assessment & Plan Note (Signed)
His device is working normally. We'll recheck in several months. 

## 2010-05-17 ENCOUNTER — Other Ambulatory Visit (INDEPENDENT_AMBULATORY_CARE_PROVIDER_SITE_OTHER): Payer: Federal, State, Local not specified - PPO | Admitting: *Deleted

## 2010-05-17 ENCOUNTER — Telehealth: Payer: Self-pay | Admitting: *Deleted

## 2010-05-17 DIAGNOSIS — I428 Other cardiomyopathies: Secondary | ICD-10-CM

## 2010-05-17 DIAGNOSIS — I255 Ischemic cardiomyopathy: Secondary | ICD-10-CM

## 2010-05-17 DIAGNOSIS — I4891 Unspecified atrial fibrillation: Secondary | ICD-10-CM

## 2010-05-17 LAB — BASIC METABOLIC PANEL
BUN: 31 mg/dL — ABNORMAL HIGH (ref 6–23)
Calcium: 9 mg/dL (ref 8.4–10.5)
GFR: 41.66 mL/min — ABNORMAL LOW (ref >60.00)
Potassium: 4.8 mEq/L (ref 3.5–5.1)
Sodium: 139 mEq/L (ref 135–145)

## 2010-05-17 NOTE — Telephone Encounter (Signed)
Lab results reviewed by DOD Dr. Excell Seltzer. Pt recommended to stop lasix and potassium. Have repeat bmet and a bnp drawn in 1 week. Pt aware of these recommendations. He has taken lasix and potassium already today but will not take anymore.  He will also make sure he is restricting his salt intake.  Pt will return to our lab next Thursday for bmet and bnp. Mylo Red RN

## 2010-05-22 ENCOUNTER — Telehealth: Payer: Self-pay | Admitting: Internal Medicine

## 2010-05-22 NOTE — Telephone Encounter (Signed)
Patient states he is having fluid build up. He feels blotted. When he lays down is hard for him to breath. He gets Sob with mild activity. Pt was taken off Lasix 40 mg once a day and Potassium 20 meq once a day due BMET results BUN 31, Creatinine 1.8. Pt. Is to have re-check  BMET, and BNP  on Thursday 05/24/10. Pt. Said he can't wait. He needs to start taken the diuretic ASAP. Dr. Myrtis Ser DOD recommends for pt. to re-start Lasix 40 mg and Potassium 20 meq once a day, and to see Dr. [name] in 2 weeks. A appointment was made for May 1st at 4:15 Pm. Pt aware.

## 2010-05-22 NOTE — Telephone Encounter (Signed)
Pt calling re fluid build up aroung heart and lungs getting worse-what to do? [phone]

## 2010-05-24 ENCOUNTER — Other Ambulatory Visit: Payer: Medicare Other | Admitting: *Deleted

## 2010-05-25 ENCOUNTER — Telehealth: Payer: Self-pay | Admitting: Internal Medicine

## 2010-05-25 NOTE — Telephone Encounter (Signed)
Pt rtn call-pls call has questions re message left on machine-still confused-pls call [phone]

## 2010-05-25 NOTE — Telephone Encounter (Signed)
Labs where done on 05/17/10  lmom

## 2010-05-25 NOTE — Telephone Encounter (Signed)
Pt's wife told him kelly called with lab results and he is confused because he said he hasn't had any labs done recently-pls call

## 2010-05-25 NOTE — Telephone Encounter (Signed)
Spoke with pt and cleared up confusion

## 2010-05-30 ENCOUNTER — Emergency Department (HOSPITAL_COMMUNITY): Payer: Federal, State, Local not specified - PPO

## 2010-05-30 ENCOUNTER — Inpatient Hospital Stay (HOSPITAL_COMMUNITY)
Admission: EM | Admit: 2010-05-30 | Discharge: 2010-06-01 | DRG: 316 | Disposition: A | Discharge status: Home / Self Care | Payer: Federal, State, Local not specified - PPO | Attending: Internal Medicine | Admitting: Internal Medicine

## 2010-05-30 ENCOUNTER — Encounter: Payer: Self-pay | Admitting: Family Medicine

## 2010-05-30 ENCOUNTER — Ambulatory Visit (INDEPENDENT_AMBULATORY_CARE_PROVIDER_SITE_OTHER): Payer: Medicare Other | Admitting: Family Medicine

## 2010-05-30 VITALS — BP 90/60 | HR 52 | Temp 97.8°F | Ht 63.0 in | Wt 153.8 lb

## 2010-05-30 DIAGNOSIS — I5023 Acute on chronic systolic (congestive) heart failure: Secondary | ICD-10-CM

## 2010-05-30 DIAGNOSIS — Z9861 Coronary angioplasty status: Secondary | ICD-10-CM

## 2010-05-30 DIAGNOSIS — R112 Nausea with vomiting, unspecified: Secondary | ICD-10-CM

## 2010-05-30 DIAGNOSIS — F17200 Nicotine dependence, unspecified, uncomplicated: Secondary | ICD-10-CM | POA: Diagnosis present

## 2010-05-30 DIAGNOSIS — I4891 Unspecified atrial fibrillation: Secondary | ICD-10-CM | POA: Diagnosis present

## 2010-05-30 DIAGNOSIS — M549 Dorsalgia, unspecified: Secondary | ICD-10-CM | POA: Diagnosis present

## 2010-05-30 DIAGNOSIS — R1011 Right upper quadrant pain: Secondary | ICD-10-CM

## 2010-05-30 DIAGNOSIS — T502X5A Adverse effect of carbonic-anhydrase inhibitors, benzothiadiazides and other diuretics, initial encounter: Secondary | ICD-10-CM | POA: Diagnosis present

## 2010-05-30 DIAGNOSIS — I509 Heart failure, unspecified: Secondary | ICD-10-CM | POA: Diagnosis present

## 2010-05-30 DIAGNOSIS — J449 Chronic obstructive pulmonary disease, unspecified: Secondary | ICD-10-CM | POA: Diagnosis present

## 2010-05-30 DIAGNOSIS — Z950 Presence of cardiac pacemaker: Secondary | ICD-10-CM

## 2010-05-30 DIAGNOSIS — I252 Old myocardial infarction: Secondary | ICD-10-CM

## 2010-05-30 DIAGNOSIS — J4489 Other specified chronic obstructive pulmonary disease: Secondary | ICD-10-CM | POA: Diagnosis present

## 2010-05-30 DIAGNOSIS — I2510 Atherosclerotic heart disease of native coronary artery without angina pectoris: Secondary | ICD-10-CM | POA: Diagnosis present

## 2010-05-30 DIAGNOSIS — N179 Acute kidney failure, unspecified: Principal | ICD-10-CM | POA: Diagnosis present

## 2010-05-30 DIAGNOSIS — E875 Hyperkalemia: Secondary | ICD-10-CM | POA: Diagnosis present

## 2010-05-30 LAB — HEPATIC FUNCTION PANEL
ALT: 37 U/L (ref 0–53)
AST: 32 U/L (ref 0–37)
Albumin: 3.9 g/dL (ref 3.5–5.2)
Bilirubin, Direct: 0.3 mg/dL (ref 0.0–0.3)
Bilirubin, Direct: 0.2 mg/dL (ref 0.0–0.3)
Total Bilirubin: 1 mg/dL (ref 0.3–1.2)
Total Bilirubin: 1.1 mg/dL (ref 0.3–1.2)

## 2010-05-30 LAB — CBC
MCH: 28.4 pg (ref 26.0–34.0)
MCHC: 32.8 g/dL (ref 30.0–36.0)
RDW: 14.2 % (ref 11.5–15.5)

## 2010-05-30 LAB — BASIC METABOLIC PANEL
CO2: 23 mEq/L (ref 19–32)
CO2: 21 mEq/L (ref 19–32)
Calcium: 9.5 mg/dL (ref 8.4–10.5)
Calcium: 9 mg/dL (ref 8.4–10.5)
Creatinine, Ser: 2.66 mg/dL — ABNORMAL HIGH (ref 0.4–1.5)
GFR calc Af Amer: 29 mL/min — ABNORMAL LOW (ref >60)
GFR calc non Af Amer: 24 mL/min — ABNORMAL LOW (ref >60)
GFR: 27.47 mL/min — ABNORMAL LOW (ref >60.00)
Glucose, Bld: 143 mg/dL — ABNORMAL HIGH (ref 70–99)
Sodium: 138 mEq/L (ref 135–145)
Sodium: 134 mEq/L — ABNORMAL LOW (ref 135–145)

## 2010-05-30 LAB — CBC WITH DIFFERENTIAL/PLATELET
Basophils Relative: 0.6 % (ref 0.0–3.0)
Eosinophils Absolute: 0.1 10*3/uL (ref 0.0–0.7)
Eosinophils Relative: 1.3 % (ref 0.0–5.0)
HCT: 51.3 % (ref 39.0–52.0)
Hemoglobin: 16.6 g/dL (ref 13.0–17.0)
Lymphs Abs: 2 10*3/uL (ref 0.7–4.0)
MCHC: 32.3 g/dL (ref 30.0–36.0)
MCV: 87.7 fl (ref 78.0–100.0)
Monocytes Absolute: 0.6 10*3/uL (ref 0.1–1.0)
Neutro Abs: 8.6 10*3/uL — ABNORMAL HIGH (ref 1.4–7.7)
RBC: 5.85 Mil/uL — ABNORMAL HIGH (ref 4.22–5.81)

## 2010-05-30 LAB — DIFFERENTIAL
Basophils Absolute: 0.1 10*3/uL (ref 0.0–0.1)
Basophils Relative: 1 % (ref 0–1)
Eosinophils Relative: 1 % (ref 0–5)
Monocytes Absolute: 0.5 10*3/uL (ref 0.1–1.0)
Monocytes Relative: 5 % (ref 3–12)

## 2010-05-30 LAB — CK TOTAL AND CKMB (NOT AT ARMC)
Relative Index: INVALID (ref 0.0–2.5)
Total CK: 31 U/L (ref 7–232)

## 2010-05-30 LAB — LIPASE: Lipase: 22 U/L (ref 11.0–59.0)

## 2010-05-30 LAB — LIPASE, BLOOD: Lipase: 26 U/L (ref 11–59)

## 2010-05-30 MED ORDER — ONDANSETRON 4 MG PO TBDP: 4.0000 mg | ORAL_TABLET | Freq: Three times a day (TID) | ORAL | Status: DC | PRN

## 2010-05-30 NOTE — Progress Notes (Signed)
65 year old male with chronic R UQ abdominal pain, nausea, vomitting, and a 25 pound weight loss:  CHF: Recent ARF, Cr up to 1.8 after starting Lasix CHF worsened, Dr. [name] added Taken off of diuretics - stopped about 05/17/2010.  Placed back on Lasix.  No f/u BMP yet  Nausea, vomitting: s/p cholecystectomy 02/2010 Not holding anything down. Canot seem to eat anything.  25 pound weight loss Chills. Hurting in his back and in his abdomen.  Will be bothering him, abdominal pain, pain in his kidneys and anteriorly. No vomitting blood, no melena, no bloody stools.  Patient Active Problem List  Diagnoses  . HYPERLIPIDEMIA  . CAD, UNSPECIFIED SITE  . ATRIAL FIBRILLATION WITH RAPID VENTRICULAR RESPONSE  . ALLERGIC RHINITIS  . COPD, MILD  . AUTOMATIC IMPLANTABLE CARDIAC DEFIBRILLATOR SITU  . DYSPNEA  . Bronchitis  . Acute on chronic systolic heart failure   Past Medical History  Diagnosis Date  . Acute myocardial infarction, unspecified site, episode of care unspecified   . Paroxysmal ventricular tachycardia   . Atrial fibrillation   . Cardiac pacemaker in situ   . Congestive heart failure, unspecified   . CAD (coronary artery disease), autologous vein bypass graft   . Chronic airway obstruction, not elsewhere classified   . Allergic rhinitis, cause unspecified   . Pure hypercholesterolemia    Past Surgical History  Procedure Date  . Cardiac defibrillator placement   . Coronary angioplasty with stent placement 1998  . Rotator cuff repair   . Cholecystectomy, laparoscopic 02/2010   History  Substance Use Topics  . Smoking status: Current Everyday Smoker -- 4.0 packs/day for 150 years    Types: Cigarettes  . Smokeless tobacco: Not on file   Comment: NOW DOWN TO 3 CIGS A DAY  . Alcohol Use: No   Family History  Problem Relation Age of Onset  . Diabetes Father    Allergies  Allergen Reactions  . Codeine   . Penicillins   . Ace Inhibitors     Cough   Current  Outpatient Prescriptions on File Prior to Visit  Medication Sig Dispense Refill  . albuterol (PROVENTIL HFA;VENTOLIN HFA) 108 (90 BASE) MCG/ACT inhaler Inhale 2 puffs into the lungs every 4 (four) hours as needed.        [name] aspirin 325 MG EC tablet Take 325 mg by mouth daily.        . benzonatate (TESSALON) 200 MG capsule Take 200 mg by mouth 3 (three) times daily as needed.        . carvedilol (COREG) 12.5 MG tablet Take 12.5 mg by mouth 2 (two) times daily with a meal.        . celecoxib (CELEBREX) 200 MG capsule Take 200 mg by mouth daily as needed.        . clotrimazole (MYCELEX) 10 MG troche Take 10 mg by mouth as needed.       . diazepam (VALIUM) 5 MG tablet Take 5 mg by mouth every 6 (six) hours as needed.        . ezetimibe (ZETIA) 10 MG tablet Take 10 mg by mouth daily.        . Fluticasone-Salmeterol (ADVAIR DISKUS) 250-50 MCG/DOSE AEPB Inhale 1 puff into the lungs 2 (two) times daily.  1 each  11  . furosemide (LASIX) 40 MG tablet Take 1 tablet (40 mg total) by mouth daily.  30 tablet  11  . HYDROcodone-acetaminophen (VICODIN) 5-500 MG per tablet Take 1 tablet  by mouth every 6 (six) hours as needed.        [name] losartan (COZAAR) 100 MG tablet Take 100 mg by mouth 2 (two) times daily.        . metaxalone (SKELAXIN) 800 MG tablet Take 1 tablet (800 mg total) by mouth 3 (three) times daily.  90 tablet  11  . nitroGLYCERIN (NITROSTAT) 0.4 MG SL tablet Place 0.4 mg under the tongue every 5 (five) minutes as needed.        . potassium chloride SA (K-DUR,KLOR-CON) 20 MEQ tablet Take 1 tablet (20 mEq total) by mouth daily.  30 tablet  11  . promethazine (PHENERGAN) 25 MG tablet TAKE 1 TABLET BY MOUTH EVERY 8 HOURS AS NEEDED FOR NAUSEA  30 tablet  1  . traMADol (ULTRAM) 50 MG tablet Take 50 mg by mouth every 6 (six) hours as needed.        . zolpidem (AMBIEN CR) 12.5 MG CR tablet Take 12.5 mg by mouth at bedtime as needed.         ROS: GEN: above GI: as above CV: no CP Pulm: SOB has improved  on diuretics Interactive and getting along well at home.  Otherwise, ROS is as per the HPI.  GEN: WDWN, NAD, Non-toxic, A & O x 3 HEENT: Atraumatic, Normocephalic. Neck supple. No masses, No LAD. Ears and Nose: No external deformity. CV: RRR, No M/G/R. No JVD. No thrill. No extra heart sounds. PULM: CTA B, no wheezes, crackles, rhonchi. No retractions. No resp. distress. No accessory muscle use. ABD: S, moderate tenderness, RUQ, ND, +BS. No rebound tenderness. No HSM. Laparoscopy scars noted EXTR: No c/c/e NEURO Normal gait.  PSYCH: Normally interactive. Conversant. Not depressed or anxious appearing.  Calm demeanor.   A/P: Chronic RUQ abdominal pain, nausea, vomitting: Check BMP, HFP. If normal, would proceed with CT of abdomen and pelvis to evaluate chronic post-op abdominal pain, > 1 month, 25 pound weight loss.  2. CHF: check BMP, Cr on lasix

## 2010-05-30 NOTE — Patient Instructions (Signed)
GO TO LAB -- WILL CALL WITH RESULTS

## 2010-05-31 LAB — CBC
MCV: 86 fL (ref 78.0–100.0)
Platelets: 298 10*3/uL (ref 150–400)
RDW: 14.3 % (ref 11.5–15.5)
WBC: 11.3 10*3/uL — ABNORMAL HIGH (ref 4.0–10.5)

## 2010-05-31 LAB — DIFFERENTIAL
Basophils Absolute: 0 10*3/uL (ref 0.0–0.1)
Eosinophils Absolute: 0.1 10*3/uL (ref 0.0–0.7)
Eosinophils Relative: 1 % (ref 0–5)

## 2010-05-31 LAB — COMPREHENSIVE METABOLIC PANEL
Albumin: 3.5 g/dL (ref 3.5–5.2)
Alkaline Phosphatase: 100 U/L (ref 39–117)
BUN: 66 mg/dL — ABNORMAL HIGH (ref 6–23)
Potassium: 6.1 mEq/L — ABNORMAL HIGH (ref 3.5–5.1)
Total Protein: 6.9 g/dL (ref 6.0–8.3)

## 2010-05-31 LAB — BASIC METABOLIC PANEL
CO2: 21 mEq/L (ref 19–32)
Calcium: 8.4 mg/dL (ref 8.4–10.5)
Glucose, Bld: 155 mg/dL — ABNORMAL HIGH (ref 70–99)
Potassium: 4.7 mEq/L (ref 3.5–5.1)
Sodium: 133 mEq/L — ABNORMAL LOW (ref 135–145)

## 2010-06-01 LAB — CBC
HCT: 43.2 % (ref 39.0–52.0)
Hemoglobin: 13.8 g/dL (ref 13.0–17.0)
MCH: 27.7 pg (ref 26.0–34.0)
MCHC: 31.9 g/dL (ref 30.0–36.0)
MCV: 86.6 fL (ref 78.0–100.0)

## 2010-06-01 LAB — BASIC METABOLIC PANEL
BUN: 47 mg/dL — ABNORMAL HIGH (ref 6–23)
CO2: 23 mEq/L (ref 19–32)
Calcium: 8 mg/dL — ABNORMAL LOW (ref 8.4–10.5)
Creatinine, Ser: 1.71 mg/dL — ABNORMAL HIGH (ref 0.4–1.5)
Glucose, Bld: 85 mg/dL (ref 70–99)

## 2010-06-05 ENCOUNTER — Ambulatory Visit (INDEPENDENT_AMBULATORY_CARE_PROVIDER_SITE_OTHER): Payer: Federal, State, Local not specified - PPO | Admitting: Internal Medicine

## 2010-06-05 DIAGNOSIS — Z95810 Presence of automatic (implantable) cardiac defibrillator: Secondary | ICD-10-CM

## 2010-06-05 DIAGNOSIS — I428 Other cardiomyopathies: Secondary | ICD-10-CM

## 2010-06-05 DIAGNOSIS — I5023 Acute on chronic systolic (congestive) heart failure: Secondary | ICD-10-CM

## 2010-06-05 DIAGNOSIS — I4891 Unspecified atrial fibrillation: Secondary | ICD-10-CM

## 2010-06-05 MED ORDER — DIGOXIN 125 MCG PO TABS
125.0000 ug | ORAL_TABLET | Freq: Every day | ORAL | Status: DC
Start: 2010-06-05 — End: 2011-05-16

## 2010-06-05 NOTE — Assessment & Plan Note (Signed)
His ventricular rate is not well controlled but he is asymptomatic. I have asked him to restart carvedilol and start digoxin. I will see him back in several weeks.

## 2010-06-05 NOTE — Progress Notes (Signed)
HPI [name] returns today for followup. He was hospitalized several days ago with pre-renal azotemia and had his meds held. At discharge the dictation suggests he was told to hold his diuretics and potassium but he and his wife thought they heard him told not to take any of his cardiac meds until coming back to see [place] today. He received an ICD shock earlier while he was clipping his toes. He denies c/p, sob, or peripheral edema. No additional complaints.  Allergies  Allergen Reactions  . Codeine   . Penicillins   . Ace Inhibitors     Cough     Current Outpatient Prescriptions  Medication Sig Dispense Refill  . aspirin 325 MG EC tablet Take 325 mg by mouth daily.        [name] DISCONTD: Fluticasone-Salmeterol (ADVAIR DISKUS) 250-50 MCG/DOSE AEPB Inhale 1 puff into the lungs 2 (two) times daily.  1 each  11  . DISCONTD: ondansetron (ZOFRAN-ODT) 4 MG disintegrating tablet Take 1 tablet (4 mg total) by mouth every 8 (eight) hours as needed for nausea.  20 tablet  0  . albuterol (PROVENTIL HFA;VENTOLIN HFA) 108 (90 BASE) MCG/ACT inhaler Inhale 2 puffs into the lungs every 4 (four) hours as needed.        . benzonatate (TESSALON) 200 MG capsule Take 200 mg by mouth 3 (three) times daily as needed.        . carvedilol (COREG) 12.5 MG tablet Take 12.5 mg by mouth 2 (two) times daily with a meal.        . celecoxib (CELEBREX) 200 MG capsule Take 200 mg by mouth daily as needed.        . clotrimazole (MYCELEX) 10 MG troche Take 10 mg by mouth as needed.       . diazepam (VALIUM) 5 MG tablet Take 5 mg by mouth every 6 (six) hours as needed.        . ezetimibe (ZETIA) 10 MG tablet Take 10 mg by mouth daily.        [name] HYDROcodone-acetaminophen (VICODIN) 5-500 MG per tablet Take 1 tablet by mouth every 6 (six) hours as needed.        [name] losartan (COZAAR) 100 MG tablet Take 100 mg by mouth 2 (two) times daily.        . metaxalone (SKELAXIN) 800 MG tablet Take 1 tablet (800 mg total) by mouth 3 (three) times  daily.  90 tablet  11  . nitroGLYCERIN (NITROSTAT) 0.4 MG SL tablet Place 0.4 mg under the tongue every 5 (five) minutes as needed.        . promethazine (PHENERGAN) 25 MG tablet TAKE 1 TABLET BY MOUTH EVERY 8 HOURS AS NEEDED FOR NAUSEA  30 tablet  1  . traMADol (ULTRAM) 50 MG tablet Take 50 mg by mouth every 6 (six) hours as needed.        . zolpidem (AMBIEN CR) 12.5 MG CR tablet Take 12.5 mg by mouth at bedtime as needed.        [name] DISCONTD: furosemide (LASIX) 40 MG tablet Take 1 tablet (40 mg total) by mouth daily.  30 tablet  11  . DISCONTD: potassium chloride SA (K-DUR,KLOR-CON) 20 MEQ tablet Take 1 tablet (20 mEq total) by mouth daily.  30 tablet  11     Past Medical History  Diagnosis Date  . Acute myocardial infarction, unspecified site, episode of care unspecified   . Paroxysmal ventricular tachycardia   . Atrial fibrillation   .  Cardiac pacemaker in situ   . Congestive heart failure, unspecified   . CAD (coronary artery disease), autologous vein bypass graft   . Chronic airway obstruction, not elsewhere classified   . Allergic rhinitis, cause unspecified   . Pure hypercholesterolemia     ROS:   All systems reviewed and negative except as noted in the HPI.   Past Surgical History  Procedure Date  . Cardiac defibrillator placement   . Coronary angioplasty with stent placement 1998  . Rotator cuff repair   . Cholecystectomy, laparoscopic 02/2010     Family History  Problem Relation Age of Onset  . Diabetes Father      History   Social History  . Marital Status: Married    Spouse Name: N/A    Number of Children: 0  . Years of Education: N/A   Occupational History  . 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 Smoking status: Current Everyday Smoker -- 4.0 packs/day for 150 years    Types: Cigarettes  . Smokeless tobacco: Not on file   Comment: NOW DOWN TO 3 CIGS A DAY  . Alcohol Use: No  . Drug Use: No  . Sexually Active: Not on file    Other Topics Concern  . Not on file   Social History Narrative  . No narrative on file     BP 150/80  Pulse 118  Ht 5\' 3"  (1.6 m)  Wt 155 lb (70.308 kg)  BMI 27.46 kg/m2  Physical Exam:  Well appearing NAD HEENT: Unremarkable Neck:  No JVD, no thyromegally Lymphatics:  No adenopathy Back:  No CVA tenderness Lungs:  Clear. Well healed ICD incision. HEART:  Iregular tachycardia with no murmurs, no rubs, no clicks Abd:  Flat, positive bowel sounds, no organomegally, no rebound, no guarding Ext:  2 plus pulses, no edema, no cyanosis, no clubbing Skin:  No rashes no nodules Neuro:  CN II through XII intact, motor grossly intact  DEVICE  Normal device function.  See PaceArt for details.   Assess/Plan:

## 2010-06-05 NOTE — Assessment & Plan Note (Signed)
He appears to be euvolemic. I have asked him to restart his beta blocker. Will try digoxin to control his ventriucular rate. I suspect he will ultimately require some diuretic but will await his renal function and potassium results before starting lasix.

## 2010-06-05 NOTE — Patient Instructions (Signed)
Your physician wants you to follow-up in: 4 weeks with Dr Court Joy will receive a reminder letter in the mail two months in advance. If you don't receive a letter, please call our office to schedule the follow-up appointment.   Your physician has recommended you make the following change in your medication: restart Carvedilol 12.5mg  twice daily   Start Digoxin 0.125mg  daily

## 2010-06-05 NOTE — Assessment & Plan Note (Signed)
Today we reprogrammed his device to help prevent shocks for atrial fib which is the etiology of his most recent shock.

## 2010-06-06 LAB — BASIC METABOLIC PANEL
BUN: 18 mg/dL (ref 6–23)
Creatinine, Ser: 1.1 mg/dL (ref 0.4–1.5)
GFR: 74.29 mL/min (ref >60.00)

## 2010-06-17 NOTE — Discharge Summary (Signed)
[name], [name]              ACCOUNT NO.:  [id number]  MEDICAL RECORD NO.:  [id number]           PATIENT TYPE:  I  LOCATION:  6706                         FACILITY:  [facility]  PHYSICIAN:  Osvaldo Shipper, MD     DATE OF BIRTH:  05/11/1945  DATE OF ADMISSION:  05/30/2010 DATE OF DISCHARGE:  06/01/2010                              DISCHARGE SUMMARY   PRIMARY CARE PHYSICIAN:  Dr. Patsy Lager.  CARDIOLOGIST:  Doylene Canning. [name], MD  No consultations obtained during this admission.  Imaging studies done during this admission include an acute abdominal series which showed cardiomegaly without acute disease, nonspecific, nonobstructive bowel gas pattern.  PERTINENT LABS:  Include a potassium of 6.7 this was a non-hemolyzed sample, subsequently became normal.  BUN of 68, creatinine of 2.5, increased to 69 and 2.66, and then it is down to 47 and 1.71 as of today, calcium is 8.0.  Cardiac enzymes were negative.  Lipase was normal.  DISCHARGE DIAGNOSES: 1. Acute renal failure secondary to diuretics, improved. 2. Hyperkalemia secondary to potassium supplementation along with ACE     and ARB, resolved. 3. History of congestive heart failure compensated. 4. History of atrial fibrillation, stable, not on anticoagulation. 5. History of coronary artery disease and chronic obstructive     pulmonary disease stable. 6. Chronic back pain.  BRIEF HOSPITAL COURSE:  Briefly, this is a 65 year old Caucasian male who presented to the hospital after being found to have hyperkalemia during routine labs at his Cardiology Clinic.  Apparently, the patient was put on Lasix about 3 weeks ago.  He took it for a week.  Had blood work which showed elevation in BUN and creatinine and was asked to stop his Lasix for a week and then was restarted about a week prior to admission.  When he was checked in the office, he was found to have hyperkalemia along with acute renal failure.  He was also given potassium  supplementation along with the Lasix.  So essentially, the patient came in with ARF.  He was seen in the ED, was started on IV fluids, was admitted to the hospital.  He was given some Kayexalate to bring his potassium level down.  An EKG was done which did not show any changes due to the hyperkalemia.  With the Kayexalate, the potassium level did come down and is down to 4.5 this morning.  With the IV hydration, his BUN has come down to 47, creatinine has come down to 1.71.  He is able to tolerate p.o. intake.  He feels great and I think the rest of the management can be done as an outpatient, so he will be discharged home.  All of his other medical issues are all stable.  Since he does have congestive heart failure, the patient should be on either ACE inhibitor or ARB.  I have recommended to him to restart his Cozaar on Tuesday and then the initiation of lisinopril can be determined based on his blood work on Tuesday, and I will defer that issue to Dr. [name].  On the day of discharge, the patient is feeling quite well.  He  is keen on going home.  Denies any problems, eating well, drinking quite well, urinating quite adequately.  PHYSICAL EXAMINATION:  VITAL SIGNS:  His vital signs are all stable, saturating 98% on room air. LUNGS:  Clear to auscultation bilaterally. CARDIOVASCULAR:  S1 and S2 normal and regular.  No S3, S4, rubs, murmurs, or bruits. ABDOMEN:  Soft. NEUROLOGIC:  He is alert, oriented x3.  No focal neurological deficits are present.  So essentially, the patient is stable for discharge with close followup with his primary care physician or his cardiologist.  DISCHARGE MEDICATIONS: 1. Aspirin 325 mg p.o. daily. 2. Carvedilol 12.5 mg p.o. b.i.d. 3. Clotrimazole thrush 10 mg daily as needed for thrush. 4. Cozaar 100 mg p.o. b.i.d., to be resumed on Tuesday Jun 05, 2010. 5. Diazepam 5 mg 3 times daily as needed for muscle spasms. 6. Four-way nasal spray over the  counter 2 sprays nasally daily. 7. Hydrocodone/acetaminophen 5/500 four times daily as needed for     pain. 8. Nitroglycerin 0.4 mg as needed for chest pain sublingually. 9. Promethazine 25 mg every 8 hours as needed for nausea. 10.Skelaxin 800 mg 3 times daily as needed for muscle spasm. 11.Tessalon Perles 3 times daily as needed for cough. 12.Tramadol 50 mg 4 times daily as needed. 13.Ventolin inhaler 1-2 puffs every 4 hours as needed for cough or     wheezing. 14.Zetia 10 mg p.o. daily. 15.Zofran ODT 4 mg every 8 hours as needed for nausea. 16.Zolpidem 10 mg daily at bedtime as needed for insomnia.  He has been asked to discontinue his Celebrex and lisinopril for now as they can cause more nephrotoxicity.  The reinitiation of lisinopril will be deferred to Dr. Sharrell Ku.  FOLLOWUP:  The patient has an appointment, I was told by his office with Dr. [name] on Jun 05, 2010, at 4:15 p.m.  I think it is predominately for blood work, but the patient should be able to see Dr. [name] at that time.  BMET will be recommended during that visit.  Once again, reinitiation of lisinopril will be deferred to Dr. [name].  Once again, he has been asked to hold off on his Lasix and potassium till he is seen by Dr. [name].  He had been told that in the interim if he has any chest pain, shortness of breath, he needs to seek attention immediately.  TOTAL TIME ON THIS DISCHARGE ENCOUNTER:  35 minutes.  Osvaldo Shipper, MD     GK/MEDQ  D:  06/01/2010  T:  06/02/2010  Job:  [id number]  cc:   Doylene Canning. [name], MD Dr. Patsy Lager  Electronically Signed by Osvaldo Shipper MD on 06/17/2010 09:56:27 PM

## 2010-06-19 NOTE — Assessment & Plan Note (Signed)
[facility]                         ELECTROPHYSIOLOGY OFFICE NOTE   NAME:BOSLEYIyan, Flett                     MRN:          [id number]  DATE:08/04/2007                            DOB:          [birth year]    Mr. Andal returns today for followup.  He is a very pleasant patient of  Dr. Andi Devon, who has a history of ischemic cardiomyopathy and congestive  heart failure class I-II.  He also has a history of large anterior wall  myocardial infarction.  He is status post prophylactic ICD insertion who  returns today for followup.  He was seen in Dr. Augustin Schooling office several  weeks ago and was found to be in atrial fibrillation with a rapid  ventricular response.  Despite this, he is asymptomatic.  He denies  palpitations to speak of.  He has had no syncope or any intercurrent ICD  therapies.  He notes that he has rare palpitations.   MEDICATIONS:  1. Cozaar 100 twice daily.  2. Celebrex 200 a day.  3. Aspirin 81 mg daily.  4. Coreg 12.5 twice daily.  5. Zetia 10 mg daily.  6. Skelaxin p.r.n.  7. Levaquin 500 daily x14 days.   PHYSICAL EXAMINATION:  GENERAL:  He is a pleasant middle-aged man in no  acute distress.  VITAL SIGNS:  Blood pressure today is 124/92, pulse 76 and regular,  respirations are 18, and weight is 174 pounds.  HEENT:  Normocephalic and atraumatic.  Pupils are equal and round.  Oropharynx is moist.  Sclerae are anicteric.  NECK:  No jugular venous distention.  LUNGS:  Clear bilaterally to auscultation.  No wheezes, rales, or  rhonchi are present.  CARDIOVASCULAR:  Regular rate and rhythm.  Normal S1 and S2.  No  murmurs, rubs, or gallops present.  ABDOMEN:  Soft and nontender.  EXTREMITIES:  No edema.   IMPRESSION:  1. Ischemic cardiomyopathy.  2. Congestive heart failure.  3. New-onset atrial fibrillation.  4. Nonsustained ventricular tachycardia.   DISCUSSION:  Mr. Jewkes is stable today.  He today is in sinus rhythm on  physical examination.  Fortunately, he does not feel his atrial  fibrillation.  Unfortunately because we do not know when he goes into  it, we had to assume that his episode several weeks ago was not an  isolated incident.  To this end, because of his prior medical problems  including ischemic cardiomyopathy and mild congestive heart failure,  that I have recommended Coumadin initiation.  Also, I have informed him  that while aspirin would be another option, it is clearly not as good  for thromboembolic prevention as is the Coumadin.  He is considering his  options today.  He will continue on his aspirin.  If he desires to start  Coumadin which I have clearly stated to him that I recommend, he will  call us and we will plan on getting him into our Coumadin Clinic at that  point in time.     Doylene Canning. [name], MD  Electronically Signed    GWT/MedQ  DD: 08/04/2007  DT: 08/05/2007  Job #: [id number]  cc:   Gabriel Earing, M.D.

## 2010-06-19 NOTE — Assessment & Plan Note (Signed)
[facility]                         ELECTROPHYSIOLOGY OFFICE NOTE   NAME:BOSLEYTyrel, [name]                     MRN:          [id number]  DATE:08/12/2006                            DOB:          Dec 08, 1945    [name] returns today for followup.  He is a very pleasant middle-  aged male with an ischemic cardiomyopathy and nonsustained VT and  congestive heart failure, presently class I-II.  He returns today for  followup.  He denies chest pain or shortness of breath.  He is  considering relocating down to [place].  He denies peripheral edema.   PHYSICAL EXAMINATION:  GENERAL:  He is a pleasant, well-appearing man in  no distress.  VITAL SIGNS:  Blood pressure was 102/78.  Pulse was 64 and regular.  Respirations were 18.  Weight was 178 pounds.  NECK:  No jugular venous distention.  LUNGS:  Clear bilaterally to auscultation.  There are no wheezes, rales  or rhonchi.  CARDIOVASCULAR:  Regular rate and rhythm with a normal S1 and S2.  There  are no murmurs, rubs or gallops present.  EXTREMITIES:  No edema.   MEDICATIONS:  1. Cozaar 100 twice daily.  2. Celebrex.  3. Aspirin.  4. Coreg 12.5 twice daily.   Interrogation of his defibrillator demonstrates a Medtronic Maximo with  R waves of 5, impedance 472 ohm with a threshold of 1 volt at 0.3.  Battery voltage was 3.1 volts.  There were no intercurrent ICD  therapies.  He did have some nonsustained VT.   IMPRESSION:  1. Ischemic cardiomyopathy.  2. Ventricular tachycardia.  3. Congestive heart failure.  4. Status post implantable cardioverter/defibrillator insertion.   DISCUSSION:  Overall, [name] is stable, and his defibrillator is  working normally.  We will plan to see him back in the office in one  year.     Doylene Canning. [name], MD  Electronically Signed    GWT/MedQ  DD: 08/12/2006  DT: 08/12/2006  Job #: [id number]

## 2010-06-21 NOTE — H&P (Signed)
[name], [name] NO.:  [id number]  MEDICAL RECORD NO.:  [id number]          PATIENT TYPE:  LOCATION:                                 FACILITY:  PHYSICIAN:  Massie Maroon, MD        DATE OF BIRTH:  09/06/1945  DATE OF ADMISSION: DATE OF DISCHARGE:                             HISTORY & PHYSICAL   CHIEF COMPLAINT:  Hyperkalemia.  HISTORY OF PRESENT ILLNESS:  A 65 year old male with a history of CAD, status post MI in 1995, status post PTCA stent in 1998, V-tach, AFib, status post pacemaker, CHF (EF 26%), COPD, complains of nausea, vomiting times 10-14 days.  He denies any bloody emesis.  The patient denies any fever, chills, cough, chest pain, palpitations, shortness of breath, constipation, bright red blood per rectum or black stool.  The patient does note slight epigastric discomfort as well as bloating and gassiness, which he has treated himself with Zantac, which has helped. He does have chronic right-sided pain just below the rib cage since having had his cholecystectomy.  The patient denies any NSAID use.  He does note that he has been taking lisinopril and Cozaar.  Potassium in the emergency room was initially 6.7.  Repeat potassium was 5.6.  BUN and creatinine were 69 and 2.66.  Baseline creatinine appears to be about 1.8 in April 2012 and 1.06, January 2012.  The patient will be admitted for hyperkalemia and acute renal failure.  PAST MEDICAL HISTORY: 1. CAD status post MI in 1995, status post PTCA stent in 1998. 2. History of V-tach. 3. History of AFib with RVR. 4. History of CHF (EF 26%). 5. Hyperlipidemia (intolerance to statin - myopathy). 6. COPD. 7. Dysphasia. 8. Allergic rhinitis.  PAST SURGICAL HISTORY: 1. AICD. 2. PTCA stent in 1998. 3. RTC repair, right.  SOCIAL HISTORY:  The patient is a retired Presenter, broadcasting.  He currently smokes about one to three cigarettes per day times 45 years. He does not drink.  He is married and  has no children.  FAMILY HISTORY:  His father and mother both had diabetes and there is no family history of colon cancer.  ALLERGIES:  CODEINE and PENICILLIN.  MEDICATIONS: 1. Four-Way Nasal Spray OTC 2 sprays daily. 2. Zofran 4 mg p.o. q.8 h. p.r.n. 3. Ambien 10 mg p.o. nightly p.r.n. 4. Zetia 10 mg p.o. daily. 5. Ventolin HFA 1-2 puffs q.4 h. p.r.n. 6. Tramadol 50 mg p.o. q.i.d. p.r.n. 7. Tessalon Perles 200 mg p.o. t.i.d. p.r.n. cough. 8. Skelaxin 800 mg p.o. t.i.d. p.r.n. 9. Promethazine 25 mg p.o. q.8 h. p.r.n. 10.Sublingual nitroglycerin 0.4 mg p.o. q.5 minutes p.r.n. chest pain. 11.Lisinopril 10 mg p.o. daily. 12.Vicodin 5/500 mg one p.o. q.i.d. p.r.n. 13.Diazepam 5 mg p.o. t.i.d. p.r.n. 14.Cozaar 100 mg p.o. b.i.d. 15.Carvedilol 12.5 mg p.o. b.i.d. 16.Clotrimazole [place] 10 mg p.o. daily. 17.Celebrex 200 mg p.o. daily p.r.n. 18.Enteric-coated aspirin 325 mg p.o. daily.  REVIEW OF SYSTEMS:  Negative for all 10 organ systems except for pertinent positives stated above.  PHYSICAL EXAM:  VITAL SIGNS:  Temperature 97.5, pulse 61, blood pressure is 112/80, pulse  ox 93% on room air. HEENT:  Anicteric. NECK:  No JVD.  No bruit. HEART:  Irregularly irregular.  S1-S2.  No murmurs, gallops, or rubs. LUNGS:  Clear to auscultation bilaterally. ABDOMEN:  Soft, nontender, nondistended.  Positive bowel sounds. EXTREMITIES:  No cyanosis, clubbing, or edema. SKIN:  No rashes. LYMPH NODES:  No adenopathy. NEURO:  Nonfocal.  Cranial nerves II-XII intact.  Reflexes 2+, symmetric, diffuse with downgoing toes bilaterally, motor strength 5/5 in all four extremities, pinprick intact.  LABS:  Lipase 26, AST 32, ALT 35.  Sodium 134, potassium 5.6, BUN 69, creatinine 2.66.  WBC 11.0, hemoglobin 16.1, platelet count 314. Cardiac markers pending.  EKG showed AFib at 95, normal axis, no ST-T segment changes compared to prior EKG, specifically no PTs.  ASSESSMENT AND PLAN: 1. Hyperkalemia:   Kayexalate.  Hold lisinopril.  Hold Cozaar. 2. Acute renal failure:  Likely secondary to Lasix being started     recently as well as being on lisinopril, Cozaar, and possibly     Celebrex.  We will hydrate gently.  If not resolving, then consider     further workup with urine sodium, urine creatinine, urine     eosinophils, and renal ultrasound.  Did not order renal ultrasound     due to the fact that he has had an ultrasound of the abdomen,     January 08, 2010. 3. Nausea, vomiting:  Treat symptomatically with Zofran, Phenergan,     and started on Protonix 40 mg p.o. daily.  If this is persistent,     consider further workup with EGD and possibly gastric emptying     study and involvement of gastrointestinal.  Check cardiac markers     and check urinalysis. 4. Hyperlipidemia.  Continue Zetia due to the fact that he has statin     intolerance. 5. Congestive heart failure.  Continue carvedilol. 6. Atrial fibrillation.  Continue carvedilol and aspirin.  Defer in     terms of anticoagulation to his cardiologist.  DVT prophylaxis,     sequential compression devices.     Massie Maroon, MD     JYK/MEDQ  D:  05/30/2010  T:  05/30/2010  Job:  [id number]  Electronically Signed by Pearson Grippe MD on 06/21/2010 06:55:13 AM

## 2010-06-22 NOTE — Cardiovascular Report (Signed)
   NAMEMAJED, PELLEGRIN                        ACCOUNT NO.:  [id number]   MEDICAL RECORD NO.:  [id number]                   PATIENT TYPE:  OIB   LOCATION:  2899                                 FACILITY:  [facility]   PHYSICIAN:  Charlton Haws, M.D.                  DATE OF BIRTH:  1945/06/20   DATE OF PROCEDURE:  DATE OF DISCHARGE:                              CARDIAC CATHETERIZATION   PROCEDURE:  Coronary arteriography.   INDICATIONS:  Ischemic cardiomyopathy.  The patient is a Naval architect for  [employer].   PROCEDURE:  Standard catheterization was done from rhe right femoral artery.   ANGIOGRAPHY:  Left mid coronary artery had a 20% discreet lesion.   The left anterior descending artery had 30% multiple discreet lesions in the  mid vessel.   First diagonal branch had 30% tubular lesion.   The circumflex artery had 20-30% multiple lesions in the proximal vessel.  First obtuse marginal branch had 30% multiple discreet lesions.  The AV  groove branch just after the takeoff of the first obtuse marginal branch had  a 70% tubular lesion.   The distal AV groove branch prior to a small trifurcation had a 70-80%  eccentric lesion.   RAO VENTRICULOGRAM:  RAO ventriculography showed global hypokinesis with  inferior, posterior, and apical wall akinesis.  The ejection fraction was  only in the 20-25% range.  There is angiographic grade 1-2 MR.  Aortic  pressure was in the 144/95 range.  LV pressure was in the 144/8 range.   IMPRESSION:  Films were reviewed with Arturo Morton. Riley Kill, M.D.  The patient  did not have ischemia on this Cardiolite and the AV groove branch is small  and would unlikely to be causing problem.  The patient has primarily been  having fatigue and not chest pain.  We both agreed that increased medical  therapy is warranted.  The patient will be hospitalized to watch his groin.  His PTT was unusually elevated.  He may have a lupus anticoagulant.  Will  have hematology see  him.   The patient has an ischemic cardiomyopathy with decreased ejection fraction  and I think that he would benefit from an EP consultation in hospital.  I,  myself, think he would benefit from a defibrillator.   Will adjust his medicines further while in the hospital.                                               Charlton Haws, M.D.    PN/MEDQ  D:  07/21/2002  T:  07/21/2002  Job:  [id number]

## 2010-06-22 NOTE — Discharge Summary (Signed)
   [name], [name]                        ACCOUNT NO.:  [id number]   MEDICAL RECORD NO.:  [id number]                   PATIENT TYPE:  OIB   LOCATION:  4727                                 FACILITY:  [facility]   PHYSICIAN:  Doylene Canning. [name], M.D.               DATE OF BIRTH:  11/03/1945   DATE OF ADMISSION:  08/26/2002  DATE OF DISCHARGE:  08/27/2002                                 DISCHARGE SUMMARY   PRIMARY DIAGNOSIS:  Ischemic cardiomyopathy.   HISTORY OF PRESENT ILLNESS:  The patient is a 65 year old gentleman who  underwent angioplasty with severe LV dysfunction, had a catheterization  several weeks ago with an EF of 25-30% with an occluded right coronary  artery with a patent PDA by collaterals.  In addition, he had a 70% AV  groove left circumflex, negative Cardiolite for ischemia, denies chest pain,  shortness of breath, the patient takes Vivarin to help him stay awake and  sometimes takes Ambien to help him fall asleep, he continues to smoke  cigarettes.   PAST MEDICAL HISTORY:  As previously noted.  In addition, he has had  tonsillectomy, rotator cuff tear.   BRIEF HOSPITAL COURSE:  The patient was admitted with ischemic  cardiomyopathy and a wide QRS for placement of an ICD.  He underwent  placement of a Medtronic type ICD on August 26, 2002.  He tolerated the  procedure well, had no immediate postop complications and was discharged the  following day in stable condition on all of his previous medications:  Cozaar 100 daily, coated aspirin 81 daily, and _______ 3.125 b.i.d., Tylenol  one to two tabs every 4-6 hours as needed.  Activity and wound care were per  device discharge sheet.  Low fat, low cholesterol, low salt diet.  The  patient had an appointment to see Dr. [name] on ______ 27  and his  appointment was cancelled.  He was to follow with the pacemaker clinic at  [facility] on September 13, 2002 at 9:30 a.m. and Dr. [name] January 24, 2003 at  10 a.m.      Chinita Pester, C.R.N.P. [facility]                 Doylene Canning. [name], M.D.    DS/MEDQ  D:  08/27/2002  T:  08/28/2002  Job:  [id number]   cc:   Charlton Haws, M.D.   Duncan Dull, M.D.  [facility] - Internal Med.  [place], [place] [address]  Fax: [phone]   [facility] [place]   Kathrine Cords, R.N. [facility]

## 2010-06-22 NOTE — Op Note (Signed)
NAMEAMAD, MAU                        ACCOUNT NO.:  [id number]   MEDICAL RECORD NO.:  [id number]                   PATIENT TYPE:  OIB   LOCATION:  4727                                 FACILITY:  [facility]   PHYSICIAN:  Doylene Canning. [name], M.D.               DATE OF BIRTH:  Nov 16, 1945   DATE OF PROCEDURE:  08/26/2002  DATE OF DISCHARGE:                                 OPERATIVE REPORT   PROCEDURE PERFORMED:  Insertion of a single chamber ICD.   INDICATIONS FOR PROCEDURE:  History of paroxysmal ventricular tachycardia  with ischemic cardiomyopathy, wide QRS status post anterior myocardial  infarction with ejection fraction of 25%.   I:  INTRODUCTION:  The patient is a 65 year old man with known coronary artery disease status  post myocardial infarction.  His ejection fraction was 25%. His QRS duration  is 126 msec.  The patient has a history of nonsustained VT.  He is now  referred for ICD insertion (Meta II).   II:  PROCEDURE:  After informed consent was obtained, the patient was taken to the diagnostic  EP Lab in a fasting state.  After the usual preparation and draping,  intravenous Fentanyl and Midazolam was given for sedation.  A total of 30 cc  of Lidocaine was infiltrated into the left infraclavicular region.  A 10 cm  incision was carried out over this region and electrocautery utilized to  dissect down to the fascial plane.  Then 10 cc of contrast injected into the  left upper extremity venous system and demonstrated a patent left subclavian  vein.  It was subsequently punctured and the [facility]  6947 65 cm active fixation pacing lead serial number [id number] was advanced  into the right ventricle.  The R waves were somewhat decreased throughout  the right ventricle.  At the final site near the RV apex, the R waves  measured 7 mV.  With the lead actively fixed, the pacing threshold was 0.65  volts at 0.5 msec.  The pacing impedance was 777 ohms.   With the lead in  satisfactory position, it was secured to the subpectoralis fascia with a  figure-of-eight silk sutures.  Of note, 10 volt pacing did not stimulate the  diaphragm.  In addition, the sewing sleeves were secured with silk suture.  Electrocautery was utilized to make a subcutaneous pocket.  Electrocautery  was also utilized to assure hemostasis.  Kanamycin irrigation was utilized  to irrigate the incision and the [employer] MOVR model [id number] serial  number [id number] was connected to the defibrillation lead and placed in the  subcutaneous pocket.  The patient was more deeply sedated and defibrillation  threshold testing then carried out.   After the patient was more deeply sedated with Fentanyl and Versed, a 15  joule shock VFT test was carried out.  VF was induced with a T wave shock  and a 15 joule  shock failed to terminate ventricular fibrillation.  At this  point, the device charged to 25 joules and 25 joules was delivered  terminating ventricular fibrillation and restoring sinus rhythm. Five  minutes was allowed to elapse and a second VFT test carried out.  At this  time, VF was induced again with a T wave shock and a 20 joule shock was then  utilized to terminate VF, this unfortunately failed and device charged to 28  joules which also failed initially to terminate VF.  A 360 joule rescue  shock was then delivered terminating ventricular fibrillation.  Five minutes  was allowed to elapse and a third VFT test carried out.  Again VF was  induced with a T wave shock and this time a 25 joule shock was utilized  which also failed to terminate the ventricular fibrillation.  Again a 360  joule rescue shock was delivered restoring sinus rhythm.  At this point, the  lead configuration was reversed and VFT testing again carried out.  Again  the patient was more deeply sedated with Fentanyl and Versed and again VF  was induced with a T wave shock and this time a 25 joule shock  terminated  ventricular fibrillation restoring sinus rhythm.  At this point, additional  Kanamycin was utilized to irrigate the incision and the incision was closed  with a layer of 2-0 Vicryl followed by a layer of 3-0 Vicryl followed by a  layer of 4-0 Vicryl.  Benzoin was painted on the skin.  Steri-Strips were  applied and a pressure dressing placed and the patient returned to his room  in satisfactory condition.   III:  COMPLICATIONS:  There were no immediate procedural complications.   IV:  RESULTS:  This demonstrates successful implantation of a Medtronics single chamber  defibrillator in a patient with an elevated defibrillation threshold.                                               Doylene Canning. [name], M.D.    GWT/MEDQ  D:  08/26/2002  T:  08/26/2002  Job:  [id number]   cc:   Charlton Haws, M.D.   Gabriel Earing, M.D.  [address]  [place]  [place] [address]  Fax: [phone]   Kathrine Cords, R.N. [facility]

## 2010-06-22 NOTE — Op Note (Signed)
NAME:  [name], [name]                     ACCOUNT NO.:  [id number]   MEDICAL RECORD NO.:  [id number]                   PATIENT TYPE:  AMB   LOCATION:  DAY                                  FACILITY:  [facility]   PHYSICIAN:  Madlyn Frankel. Charlann Boxer, M.D.               DATE OF BIRTH:  [birth year]   DATE OF PROCEDURE:  05/06/2003  DATE OF DISCHARGE:                                 OPERATIVE REPORT   PREOPERATIVE DIAGNOSES:  1. Left knee internal derangement.  2. Medial synovial joint line pain associated with swelling and mechanical     symptoms.   POSTOPERATIVE DIAGNOSES:  1. Left knee abundant anterior, medial and lateral synovitis, synovial     shelves impinging in flexion and extension along the medial femoral     condyle.  2. Grade 1-2 chondromalacia defect in the medial proximal tibia plateau.  3. Intact anterior cruciate ligament.  4. Intact lateral compartment.   OPERATION/PROCEDURE:  1. Left knee diagnostic and operative arthroscopy with abundant anterior,     medial and lateral synovectomy.  2. Medial compartment chondroplasty.   SURGEON:  Madlyn Frankel. Charlann Boxer, M.D.   ASSISTANT:  None.   ANESTHESIA:  General with local placed postoperatively.   INDICATIONS FOR PROCEDURE:  Mr. Folts is a 65 year old gentleman who had  been followed clinically with mechanical symptoms associated with swelling  of the knee.  Based on the fact that he had a cardiac pacemaker in place, an  MRI was not possible.  After failing conservative measures including  cortisone injections and anti-inflammatories with persistent symptoms, he  wished to consider surgical options.  We discussed the risks and benefits of  diagnostic and operative arthroscopy and he consented to this.   DESCRIPTION OF PROCEDURE:  The patient was brought to the operating theater.  With adequate anesthesia and preoperative antibiotics of 1 g of vancomycin  secondary to penicillin allergy, he was positioned supine with this left  leg  in the leg holder.  Left lower extremity was then prepped and draped in the  sterile fashion.  Inferior, inferolateral, superolateral, inferomedial  portals were utilized.  Diagnostic evaluation of the knee was carried out.  Overriding the prominent finding initially was abundant synovium, making it  very difficult to visualize the patellofemoral joint and anterior part of  the knee.  Following further evaluation of the knee, he was noted to have  relatively intact patellofemoral joint with some cartilage loss on the  anterior pole.  Further evaluation revealed an intact medial meniscus and  intact ACL and intact lateral compartment.  He did have some grade 2  chondromalacia noted in the medial proximal tibia with a defect palpable  with a probe.  Following this evaluation, the inferomedial portal was  utilized to __________with the 3.5 shaver to debride the abundant synovium  present. This helped with visualization and very well could have been a  symptomatic issue with him based on these other  findings.  The shaver was  then  utilized to debride the proximal medial tibia.  Following this, the knee was  reexamined for loose fragments.  The instruments were removed.  Portals were  reapproximated with 3-0 nylon.  The patient was placed into bulky Jones  dressing sterilely.  He was transferred to the recovery room in stable  condition.                                               Madlyn Frankel Charlann Boxer, M.D.    MDO/MEDQ  D:  05/06/2003  T:  05/06/2003  Job:  [id number]

## 2010-06-22 NOTE — Op Note (Signed)
NAMEBRANTLY, KALMAN NO.:  [id number]   MEDICAL RECORD NO.:  [id number]          PATIENT TYPE:  INP   LOCATION:  0004                         FACILITY:  [facility]   PHYSICIAN:  Erasmo Leventhal, M.D.DATE OF BIRTH:  1945-07-17   DATE OF PROCEDURE:  04/20/2004  DATE OF DISCHARGE:                                 OPERATIVE REPORT   PREOPERATIVE DIAGNOSIS:  Right shoulder probable rotator cuff tear.   POSTOPERATIVE DIAGNOSIS:  Right shoulder small full-thickness supraspinatus  rotator cuff tear.   PROCEDURE:  The right shoulder was examined under anesthesia, glenohumeral  arthroscopy with intra-articular debridement of partial subscapularis tear,  arthroscopic and subacromial debridement, arthroscopic rotator cuff repair.   SURGEON:  Erasmo Leventhal, M.D.   ASSISTANT:  Jaquelyn Bitter. Chabon, PA-C.   ANESTHESIA:  General.   ESTIMATED BLOOD LOSS:  10 cc.   COMPLICATIONS:  None.   DISPOSITION:  To PACU stable.   OPERATIVE DETAILS:  Patient was counseled in the holding area, and the  correct side was identified.  The chart was reviewed and signed  appropriately.  Taken to the OR.  IV antibiotics were given.  Placed under  general anesthesia.  Placed in the modified beach-chair position.  The right  shoulder was examined.  Full range of motion.  Stable.  Prepped with  Duraprep and draped in a sterile fashion.  The posterior portal was created.  The arthroscope was placed in the glenohumeral joint.  A diagnostic  arthroscopy of the articular cartilage and glenohumeral ligaments and labrum  to be satisfactory.  There was some fraying of the subscapularis and intra-  articular muscle edges.  An anterior portal was made through the rotator  cuff interval.  This was shaved and debrided back to healthy tissue.   The rotator cuff looked like it had a small rotator cuff tear of the  supraspinatus.  The irrigating arthroscopic port was removed.   I now placed the  arthroscope in the subacromial region.  The subacromial  region debrided with a __________ bursectomy __________ the previous  subacromial decompression and distal clavicle resection 11 years ago.  It  was nice and flat, but the subacromial region was debrided.  The rotator  cuff tear was identified.  It was a supraspinatus insertion tear with  approximately 8 mm in size with minimal retraction.  The edges were  freshened back to healthy edge with a basket.  There was a small spur in the  greater tuberosity.  It was removed, and the repair site was repaired with  the bur.  An Arthrex absorbable anchor was then placed at the correct angle,  and then utilizing arthroscopic technique, two mattress sutures were  implanted, tying the cuff back to its anatomical insertion site nicely.  The  area was re-irrigated.  There were no other abnormalities noted.  Again, the  subacromial region had a subacromial bursectomy and scar removal.  The  arthroscopic equipment was now removed.  The portal sites were closed with 4-  0 nylon suture.  At the end of the case, 20 cc of 0.25% Marcaine and  epinephrine were placed into the portal sites of the subacromial region.  A  sterile compressive dressing was applied, and a small shoulder pillow.  He  was then gently awakened.  He was taken to the operating room and to the  PACU in stable condition after receiving another gram of Ancef.  Patient  tolerated the procedure well.  There were no complications or problems.  He  was doing well in the recovery room at the time of this dictation.   To decrease surgical time and help throughout the entire surgical procedure,  Mr. Brett Canales Chabon's assistance was needed.      RAC/MEDQ  D:  04/20/2004  T:  04/20/2004  Job:  [id number]
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
## 2010-06-22 NOTE — Discharge Summary (Signed)
[name], [name]                        ACCOUNT NO.:  [id number]   MEDICAL RECORD NO.:  [id number]                   PATIENT TYPE:  OIB   LOCATION:  4715                                 FACILITY:  [facility]   PHYSICIAN:  Sherlene Shams, M.D.              DATE OF BIRTH:  10-07-1945   DATE OF ADMISSION:  07/21/2002  DATE OF DISCHARGE:  07/22/2002                           DISCHARGE SUMMARY - REFERRING   SUMMARY OF HISTORY:  The patient is a 65 year old white male.  He was seen  in the office by Dr. 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  Since his myocardial infarction in 1995,  angioplasty of his right coronary artery by Dr. Elsie Lincoln and catheterization 4-  5 years ago, he has not had any further chest discomfort.  He continues to  smoke a pack per day.  He is very active, without limitation.   After being seen in the office, Dr. Eden Emms felt that he should have lipids  and a stress Cardiolite to evaluate the status of his coronary artery  disease.  This was performed on July 16, 2002.  He reached a peak heart rate  of 146 with a 90% predicted maximal achieving 8.5 METS.  His EKG did not  show any significant ischemia; however, in the recovery period he had  ventricular couplets, triplets and multifocal PVCs.  His EF was 26% with  diffuse hypokinesis, inferior distal lateral on the apical akinesis.  Thus,  his admission for cardiac catheterization for further evaluation on his  known coronary artery disease.   LABORATORY DATA:  Preadmission H&H 14.1/41.6, normal indices, platelets 316,  WBCs 8.3.  Sodium 140, potassium 4.6, BUN 20, creatinine 0.9, glucose 103.  PTT 65, PT 12.6 with INR 0.9.   The echocardiogram did show EF at 35% with diffuse hypokinesis, posterior  akinesis, very severe LV dilatation, mild left atrial dilatation, mild  decreased RV function, 1+ tricuspid regurgitation with an elevated right  ventricular systolic pressure  at 50-60 consistent with moderate to severe  pulmonary hypertension.  EKG showed normal sinus rhythm, normal axis  intraventricular conduction delay, old inferior myocardial infarction.   Chest x-ray showed cardiomegaly without active disease.   HOSPITAL COURSE:  [name] was brought in for a short stay.  He underwent  cardiac catheterization on July 21, 2002, by Dr. Eden Emms.  This showed a left  main at 20%, 30% mid LAD, 70% AV circumflex, 70% distal circumflex.  He had  collaterals to the RCA and PDA.  The RCA was 100% totally occluded.  He had  global hypokinesis and inferior/posterior apical akinesis with an EF of 25%.  Aortic blood pressure was 144/95, LV 144/8.   Dr. Eden Emms discussed with Dr. Riley Kill that he was not sure if the AV groove  could be angioplastied.  With his PVCs that were multifocal, decreased EF,  known coronary artery disease driver, Dr. Eden Emms felt EPS should be  evaluated in the patient.  He was kept overnight and medications were  adjusted.  With an elevated PTT, he felt that a hematologist should evaluate  the patient.  After reviewing with Dr. Riley Kill, it was felt that he should  have continuing medical treatment.   [facility] consultation on July 21, 2002.  Dr. [name] will evaluate him as an  outpatient. On July 21, 2002, Hematology saw the patient and felt that the  most likely diagnosis was a lupus anticoagulant.  Component laboratories  have been sent out.  He stated that the importance of this phenomenon does  not increase the patient's risk of bleeding, that it will not be possible to  monitor heparin use by utilizing PTTs.  He will need to obtain heparin  levels when and if heparin is used.  He will obtain additional laboratories  when seen in followup.   The patient was asked to call his office in the next 4-6 weeks so we can  discuss diagnosis and evaluation.  The patient seemed to be better with  bedrest and the patient was ambulating without difficulty  and, on review on  July 22, 2002, Dr. Eden Emms felt that he could be discharged post his second  sensation consult.   DISCHARGE DIAGNOSES:  1. Ischemic cardiomyopathy that, at this time, is asymptomatic.  Continued     medical treatment.  2. Frequent premature ventricular contractions.  3. Elevated PTT.  4. Tobacco use.  History as previously.   DISPOSITION:  He is discharged home.   MEDICATIONS:  1. Coated aspirin 325 daily.  2. Cozaar 100 mg daily.  3. Celebrex 200 daily as needed.  4. Coreg 6.25 mg, 1/2 tablet b.i.d.  5. Nitroglycerin 0.4 as needed.   ACTIVITY:  He was advised no lifting, driving, sexual activity or heavy  exertion x2 days.   DIET:  Maintain low-salt, low-fat and low-cholesterol diet.   WOUND CARE:  If he has any problems with his catheterization site, he was  asked to call.   DISCHARGE INSTRUCTIONS:  He was again counseled regarding no smoking or  tobacco products.   FOLLOWUP:  1. He will see Dr. Fabio Bering PA on August 05, 2002, at 10 a.m.  At this     appointment, consideration should be given to increasing his Coreg to     6.25 mg b.i.d. and titrate accordingly.  Review of his lipid status and     the addition of a statin should also be considered, and review of cardiac     risk factor modifications, especially tobacco cessation.  2. Dr. [name] will see him in the [facility] office on August 06, 2002, at 3:30     p.m. to review possible defibrillator placement.  3. He will also call Dr. Darnelle Catalan to arrange an appointment for followup     before the end of July 2004.                                                 Sherlene Shams, M.D.    TLT/MEDQ  D:  07/22/2002  T:  07/22/2002  Job:  [id number]   cc:   Gabriel Earing, M.D.  [address]  [place]  [place] [address]  Fax: [phone]   Valentino Hue. Magrinat, M.D.  [phone]  Shan Levans [facility]  [place]  [place] [address]  Fax: [phone]    cc:   Gabriel Earing, M.D. [address]  [place]  [place] [address]   Fax: [phone]   Valentino Hue. Magrinat, M.D.  [address] [facility]  [place]  [place] [address]  Fax: [phone]
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
## 2010-06-22 NOTE — Discharge Summary (Signed)
NAMETAI, SKELLY NO.:  [id number]   MEDICAL RECORD NO.:  [id number]          PATIENT TYPE:  INP   LOCATION:  2130                         FACILITY:  [facility]   PHYSICIAN:  Erasmo Leventhal, M.D.DATE OF BIRTH:  1945/11/03   DATE OF ADMISSION:  04/20/2004  DATE OF DISCHARGE:  04/21/2004                                 DISCHARGE SUMMARY   ADMITTING DIAGNOSIS:  Rotator cuff tear, right shoulder.   DISCHARGE DIAGNOSIS:  Rotator cuff tear, right shoulder.   OPERATION:  Arthroscopic rotator cuff repair, right shoulder.   BRIEF HISTORY:  This is a 65 year old gentleman with a history of right  shoulder pain with decreased range of motion.  An MRI scan showed  impingement and rotator cuff tear.  The patient has received medical  clearance and is now scheduled for rotator cuff repair and decompression.   LABORATORY VALUES:  CBC within normal limits.  PT within normal limits.  PTT  is 60, but this was worked up prior to surgery and was felt to be okay.  BMET showed the potassium slightly low at 3.4, glucose high 144.   HOSPITAL COURSE:  The patient tolerated the operative procedure well.  He  had his implanted defibrillator turned off prior to surgery and then  restarted after surgery.  On first postoperative day, his vital signs were  stable.  He was afebrile.  He was doing well, and he was subsequently  discharged home for followup in the office.   CONDITION ON DISCHARGE:  Improved.   DISCHARGE MEDICATIONS:  1.  Percocet 1-2 q.4-6h. p.r.n. pain.  2.  Robaxin 500 mg 1 p.o. q.8h. p.r.n. spasm.   DISCHARGE INSTRUCTIONS:  Wear a sling.  He can do elbow and hand range of  motion.  Ice and elevate.  Return to the office for a recheck or sooner  p.r.n. problems.      SJC/MEDQ  D:  05/16/2004  T:  05/16/2004  Job:  [id number]

## 2010-06-22 NOTE — Consult Note (Signed)
[name], [name]                        ACCOUNT NO.:  [id number]   MEDICAL RECORD NO.:  [id number]                   PATIENT TYPE:  OIB   LOCATION:  4715                                 FACILITY:  [facility]   PHYSICIAN:  [name], M.D.            DATE OF BIRTH:  1946/01/12   DATE OF CONSULTATION:  07/21/2002  DATE OF DISCHARGE:                                   CONSULTATION   SUBJECTIVE/HISTORY OF PRESENT ILLNESS:  [name] is a very delightful 65-  year-old [place] man who presented to the [facility] cardiac  catheterization lab this morning for assessment.  He does have a prior  history of a myocardial infarction in 1995.  He has ischemic cardiomyopathy  with decreased ejection fraction.  Catheterization was part of work up.  As  part of his preprocedural labs, a CBC was obtained which showed a hemoglobin  of 14.1 gm, hematocrit 41.6%, platelet count 316,000, WBC 8300.  PT/INR at  12.6/0.9, PTT was noted to be elevated at 65.  Basic serum chemistries were  within normal limits.  Due to procedure and elevated PTT, it was felt  prudent to hospitalize patient for further evaluation.   [name] states he has never had any prior bleeding problems to his  knowledge.  He has undergone a rotator cuff repair of his right shoulder  back about five years ago.  He denied having any bleeding complications.  He  has had a tonsillectomy, adenoidectomy as a 65-year-old child without  complications.  He has four wisdom teeth extracted without complications.  He is currently on aspirin 81 mg p.o. q.d. and Cozaar therapy.  He takes  Celebrex 200 mg p.o. p.r.n.  He denies any known family history of any  bleeding dyscrasias.  He denies any nosebleeds, gingival bleeding,  hemoptysis, hematochezia or melenic stools.  He has had a history of  diverticulitis in the past.  He denies any bleeding post-episode.  He denies  any unexplained bruising or hematomas.  He also states  that the right groin  dressing from his cardiac catheterization this morning is actually dry.  He  has not appreciated any significant ecchymosis.   PAST MEDICAL HISTORY:  As per HPI to include hyperischemic cardiomyopathy  with low ejection fraction, status post MI in 1995, hypertension,  diverticulitis in 1979.   PAST SURGICAL HISTORY:  1. Status post right rotator cuff repair five years ago.  2. Tonsillectomy, adenoidectomy as a 13-year-old child.  3. Wisdom teeth extraction x 4.   ALLERGIES:  PENICILLIN causes hives and edema.  CODEINE causes significant  headaches and goes crazy.   MEDICATIONS PRIOR TO ADMISSION:  1. Cozaar 100 mg 1 p.o. q.d.  2. Aspirin 81 mg 1 p.o. q.d.  3. Celebrex 200 mg 1 p.o. p.r.n.  4. Ultracet p.r.n.  5. Ambien 10 mg p.r.n.   SOCIAL HISTORY:  [name]  He is  married.  He works as a Hospital doctor for [employer].  He does smoke, having smoked one  pack of cigarettes per day x 35 years, though it is documented he quit as of  07/20/02.  No documented alcohol history.   HEALTH MAINTENANCE:  The patient is followed closely by his primary care,  [name].  He has PSA and cholesterol checked  within the past year.  He  states he will never undergo another colonoscopy due to discomfort at the  time  of his diverticulitis.  He has never had a flu vaccine or Pneumovax.  He does not have a living will or health care power-of-attorney named as of  yet.   FAMILY MEDICAL HISTORY:  The patient's father deceased at the age of 84 for  complications of head and neck carcinoma.  His mother died at the age of 68  from stroke complications.  He has one sister who is 5 years of age.  He is  unaware of any bleeding history, though he has not been in contact with her  for 15 years.  He does not have biological children, though he has two  stepchildren, a 43 year old son named [name] who is here in [place] and a  77 year old daughter who  lives in [place].   REVIEW OF SYSTEMS:  As per HPI to include the patient denies any recurrent  headaches, recent visual changes, hearing loss without aphasia or dysphagia.  He denies any known respiratory, musculoskeletal, genitourinary or current  GI problems.  No neurologic problems.   OBJECTIVE/PHYSICAL EXAMINATION:  VITAL SIGNS:  Stable.  GENERAL:  Well-developed, well-nourished, healthy-appearing middle-aged  white male, supine in bed but in no acute distress.  Full exam deferred.  Right groin region examined, dressing in place, some ecchymosis is  appreciated just proximal to dressing, midline, but no hematoma aspect  appreciated.  IV site also examined, totally dry without bleeding.  No  ecchymosis appreciated.   IMPRESSION/RECOMMENDATIONS:  1. Elevated prothrombin time with normal PT/INR.  Case has been reviewed     with Dr. Darnelle Catalan.  At the time of this dictation, a lupus anticoagulant,     anticardiolipin, antibody specifically IgG, IgA and anti-beta-2     glycoprotein antibodies have been drawn and are pending.  He probably has     a lupus anticoagulant, but this would probably lean more towards clotting     difficulty versus bleeding.  At this point, he appears to be doing well.     He has no prior history to suggest difficulty in the past.  He has     undergone surgical procedures without postoperative complications.     Therefore, at this point, we will await results of above blood studies.     We will be happy to follow up with his patient as an outpatient if need     be if the results do not return prior to his discharge, but since he is     doing so well he will probably be discharged within the next 23 hours.     Therefore, we will tentatively plan on seeing him as an outpatient.   Thank you very much for this consultation.    Randy Nimrod, PA                     [name], M.D.    CS/MEDQ  D:  07/21/2002  T:  07/22/2002  Job:  [id number]  cc:   [name], M.D.  [address]  [place]  [place] [address]  Fax: [phone]

## 2010-06-28 ENCOUNTER — Encounter: Payer: Self-pay | Admitting: Family Medicine

## 2010-07-03 ENCOUNTER — Other Ambulatory Visit: Payer: Self-pay | Admitting: *Deleted

## 2010-07-03 MED ORDER — HYDROCODONE-ACETAMINOPHEN 5-500 MG PO TABS: 1.0000 | ORAL_TABLET | Freq: Four times a day (QID) | ORAL | Status: DC | PRN

## 2010-07-03 NOTE — Telephone Encounter (Signed)
Ok to refill #50, 2 refills Please call in

## 2010-07-03 NOTE — Telephone Encounter (Signed)
Patient rx called to pharmacy and medications updated

## 2010-07-11 ENCOUNTER — Encounter: Payer: Self-pay | Admitting: Internal Medicine

## 2010-07-11 ENCOUNTER — Ambulatory Visit (INDEPENDENT_AMBULATORY_CARE_PROVIDER_SITE_OTHER): Payer: Federal, State, Local not specified - PPO | Admitting: Internal Medicine

## 2010-07-11 DIAGNOSIS — I5023 Acute on chronic systolic (congestive) heart failure: Secondary | ICD-10-CM

## 2010-07-11 DIAGNOSIS — I4891 Unspecified atrial fibrillation: Secondary | ICD-10-CM

## 2010-07-11 MED ORDER — LOSARTAN POTASSIUM 25 MG PO TABS: 25.0000 mg | ORAL_TABLET | Freq: Every day | ORAL | Status: DC

## 2010-07-11 NOTE — Progress Notes (Signed)
HPI [name] returns today for followup. He is a pleasant 65 year old man with a history of ischemic cardiomyopathy, congestive heart failure, renal insufficiency, and hypertension. He has chronic atrial fibrillation. Recently, he developed worsening renal insufficiency and his diuretic dose was discontinued. Since then he has been stable. More importantly he is maintaining a low sodium diet. The patient denies chest pain, shortness of breath, or peripheral edema. Allergies  Allergen Reactions  . Codeine   . Penicillins   . Ace Inhibitors     Cough     Current Outpatient Prescriptions  Medication Sig Dispense Refill  . aspirin 325 MG EC tablet Take 325 mg by mouth daily.        . benzonatate (TESSALON) 200 MG capsule Take 200 mg by mouth 3 (three) times daily as needed.        . carvedilol (COREG) 12.5 MG tablet Take 12.5 mg by mouth 2 (two) times daily with a meal.        . celecoxib (CELEBREX) 200 MG capsule Take 200 mg by mouth daily as needed.        . diazepam (VALIUM) 5 MG tablet Take 5 mg by mouth every 6 (six) hours as needed.        . digoxin (LANOXIN) 0.125 MG tablet Take 1 tablet (125 mcg total) by mouth daily.  30 tablet  11  . HYDROcodone-acetaminophen (VICODIN) 5-500 MG per tablet Take 1 tablet by mouth every 6 (six) hours as needed.  50 tablet  2  . metaxalone (SKELAXIN) 800 MG tablet Take 1 tablet (800 mg total) by mouth 3 (three) times daily.  90 tablet  11  . nitroGLYCERIN (NITROSTAT) 0.4 MG SL tablet Place 0.4 mg under the tongue every 5 (five) minutes as needed.        . promethazine (PHENERGAN) 25 MG tablet TAKE 1 TABLET BY MOUTH EVERY 8 HOURS AS NEEDED FOR NAUSEA  30 tablet  1  . traMADol (ULTRAM) 50 MG tablet Take 50 mg by mouth every 6 (six) hours as needed.        . zolpidem (AMBIEN CR) 12.5 MG CR tablet Take 12.5 mg by mouth at bedtime as needed.        [name] albuterol (PROVENTIL HFA;VENTOLIN HFA) 108 (90 BASE) MCG/ACT inhaler Inhale 2 puffs into the lungs every 4  (four) hours as needed.        [name] DISCONTD: clotrimazole (MYCELEX) 10 MG troche Take 10 mg by mouth as needed.       [name] DISCONTD: ezetimibe (ZETIA) 10 MG tablet Take 10 mg by mouth daily.        [name] DISCONTD: losartan (COZAAR) 100 MG tablet Take 100 mg by mouth 2 (two) times daily.           Past Medical History  Diagnosis Date  . Acute myocardial infarction, unspecified site, episode of care unspecified   . Paroxysmal ventricular tachycardia   . Atrial fibrillation   . Cardiac pacemaker in situ   . Congestive heart failure, unspecified   . CAD (coronary artery disease), autologous vein bypass graft   . Chronic airway obstruction, not elsewhere classified   . Allergic rhinitis, cause unspecified   . Pure hypercholesterolemia     ROS:   All systems reviewed and negative except as noted in the HPI.   Past Surgical History  Procedure Date  . Cardiac defibrillator placement   . Coronary angioplasty with stent placement 1998  . Rotator cuff repair   .  Cholecystectomy, laparoscopic 02/2010     Family History  Problem Relation Age of Onset  . Diabetes Father      History   Social History  . Marital Status: Married    Spouse Name: N/A    Number of Children: 0  . Years of Education: N/A   Occupational History  . 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 Smoking status: Current Everyday Smoker -- 4.0 packs/day for 150 years    Types: Cigarettes  . Smokeless tobacco: Not on file   Comment: NOW DOWN TO 3 CIGS A DAY  . Alcohol Use: No  . Drug Use: No  . Sexually Active: Not on file   Other Topics Concern  . Not on file   Social History Narrative  . No narrative on file     BP 154/74  Pulse 70  Ht 5\' 3"  (1.6 m)  Wt 154 lb (69.854 kg)  BMI 27.28 kg/m2  Physical Exam:  Well appearing NAD HEENT: Unremarkable Neck:  No JVD, no thyromegally Lymphatics:  No adenopathy Back:  No CVA tenderness Lungs:  Clear HEART:  Iregular rate rhythm, no  murmurs, no rubs, no clicks Abd:  Flat, positive bowel sounds, no organomegally, no rebound, no guarding Ext:  2 plus pulses, no edema, no cyanosis, no clubbing Skin:  No rashes no nodules Neuro:  CN II through XII intact, motor grossly intact   DEVICE  Normal device function.  See PaceArt for details.   Assess/Plan:

## 2010-07-11 NOTE — Patient Instructions (Addendum)
Your physician wants you to follow-up in: 4 months with Dr Court Joy will receive a reminder letter in the mail two months in advance. If you don't receive a letter, please call our office to schedule the follow-up appointment.  Your physician recommends that you return for lab work in: bmp 2 weeks   Your physician has recommended you make the following change in your medication:  Start Cozaar 25 mg daily

## 2010-07-11 NOTE — Assessment & Plan Note (Signed)
His symptoms are much improved. Last time we saw him, we started digoxin and carvedilol. His blood pressure remains elevated. I have asked the patient to start Cozaar at low dose today. We'll have him come back for some blood work in 2 weeks. I have continued to reinforce the importance of low sodium diet.

## 2010-07-11 NOTE — Assessment & Plan Note (Signed)
He is asymptomatic. His ventricular response sounds to be improved somewhat on his current medical therapy. Will follow.

## 2010-07-13 ENCOUNTER — Telehealth: Payer: Self-pay | Admitting: Internal Medicine

## 2010-07-13 NOTE — Telephone Encounter (Signed)
Pt needs to have BMP on 07/25/10    [facility] for pt with info

## 2010-07-13 NOTE — Telephone Encounter (Signed)
Pt stated he was to have lab work done in a few weeks after starting a new medication.  Please check on this and advise him what and when to have.

## 2010-07-16 ENCOUNTER — Other Ambulatory Visit: Payer: Federal, State, Local not specified - PPO | Admitting: *Deleted

## 2010-07-25 ENCOUNTER — Telehealth: Payer: Self-pay | Admitting: Internal Medicine

## 2010-07-25 NOTE — Telephone Encounter (Signed)
Pt would like to be set up for lab work.

## 2010-07-30 ENCOUNTER — Other Ambulatory Visit (INDEPENDENT_AMBULATORY_CARE_PROVIDER_SITE_OTHER): Payer: Federal, State, Local not specified - PPO | Admitting: *Deleted

## 2010-07-30 DIAGNOSIS — I4891 Unspecified atrial fibrillation: Secondary | ICD-10-CM

## 2010-07-30 DIAGNOSIS — I255 Ischemic cardiomyopathy: Secondary | ICD-10-CM

## 2010-07-30 DIAGNOSIS — I2589 Other forms of chronic ischemic heart disease: Secondary | ICD-10-CM

## 2010-07-30 LAB — BASIC METABOLIC PANEL
Calcium: 8.3 mg/dL — ABNORMAL LOW (ref 8.4–10.5)
Creatinine, Ser: 1 mg/dL (ref 0.4–1.5)
GFR: 84.26 mL/min (ref >60.00)
Sodium: 138 mEq/L (ref 135–145)

## 2010-08-09 ENCOUNTER — Encounter: Payer: Federal, State, Local not specified - PPO | Admitting: *Deleted

## 2010-08-09 ENCOUNTER — Encounter: Payer: Self-pay | Admitting: Family Medicine

## 2010-08-09 ENCOUNTER — Ambulatory Visit (INDEPENDENT_AMBULATORY_CARE_PROVIDER_SITE_OTHER): Payer: Federal, State, Local not specified - PPO | Admitting: Family Medicine

## 2010-08-09 DIAGNOSIS — K602 Anal fissure, unspecified: Secondary | ICD-10-CM | POA: Insufficient documentation

## 2010-08-09 DIAGNOSIS — Z1289 Encounter for screening for malignant neoplasm of other sites: Secondary | ICD-10-CM

## 2010-08-09 NOTE — Progress Notes (Signed)
Blood noted per rectum.  BRBPR x1 week.  Pain some better after using neosporin, prev with "raw feeling".  No FCNAVD.  No other blood loss, no hemoptysis and no bruising.  He hadn't had a colonoscopy prev.  He had been check with IFOBs prev.  No FH of colon CA.    Meds, vitals, and allergies reviewed.   ROS: See HPI.  Otherwise, noncontributory.  nad ncat rrr ctab abd soft, not ttp, surgical sites healed.   Rectal fissure w/o bleeding at 3 o'clock.

## 2010-08-09 NOTE — Assessment & Plan Note (Signed)
D/w pt about topical tx, he appears to be improving.  I would wait 2 weeks from this point then check IFOB.  If neg, no further w/u.  If pos, will need further eval.  He agrees.

## 2010-08-09 NOTE — Patient Instructions (Signed)
Try to avoid prolonged sitting on the toilet and use neosporin if the area is sore.  I would check your stool for blood in 2 weeks.  If positive, we'll need to work this up more.  Take care.

## 2010-08-20 ENCOUNTER — Other Ambulatory Visit: Payer: Self-pay | Admitting: Family Medicine

## 2010-09-06 ENCOUNTER — Encounter: Payer: Federal, State, Local not specified - PPO | Admitting: *Deleted

## 2010-09-27 ENCOUNTER — Telehealth: Payer: Self-pay | Admitting: Internal Medicine

## 2010-09-27 ENCOUNTER — Other Ambulatory Visit: Payer: Self-pay | Admitting: *Deleted

## 2010-09-27 MED ORDER — ZOLPIDEM TARTRATE ER 12.5 MG PO TBCR: 12.5000 mg | EXTENDED_RELEASE_TABLET | Freq: Every evening | ORAL | Status: DC | PRN

## 2010-09-27 NOTE — Telephone Encounter (Signed)
Ok to refill 30, 5 refills 

## 2010-09-27 NOTE — Telephone Encounter (Signed)
Rx faxed to [facility]

## 2010-09-27 NOTE — Telephone Encounter (Signed)
Pt is having dental surgery on 10/10/10 @ 8am and he needs to stop his ASA three days prior and he wants to make sure that is ok and you can leave him a message on his phone

## 2010-09-27 NOTE — Telephone Encounter (Signed)
Spoke with pt, okay given to stop aspirin [name]

## 2010-10-11 ENCOUNTER — Other Ambulatory Visit: Payer: Self-pay | Admitting: Family Medicine

## 2010-10-11 ENCOUNTER — Encounter: Payer: Federal, State, Local not specified - PPO | Admitting: *Deleted

## 2010-10-18 ENCOUNTER — Encounter: Payer: Self-pay | Admitting: *Deleted

## 2010-10-22 ENCOUNTER — Encounter: Payer: Self-pay | Admitting: Internal Medicine

## 2010-10-22 ENCOUNTER — Ambulatory Visit (INDEPENDENT_AMBULATORY_CARE_PROVIDER_SITE_OTHER): Payer: Federal, State, Local not specified - PPO | Admitting: *Deleted

## 2010-10-22 ENCOUNTER — Other Ambulatory Visit: Payer: Self-pay | Admitting: *Deleted

## 2010-10-22 ENCOUNTER — Other Ambulatory Visit: Payer: Self-pay | Admitting: Internal Medicine

## 2010-10-22 DIAGNOSIS — I472 Ventricular tachycardia: Secondary | ICD-10-CM

## 2010-10-22 DIAGNOSIS — I5023 Acute on chronic systolic (congestive) heart failure: Secondary | ICD-10-CM

## 2010-10-22 MED ORDER — DIAZEPAM 5 MG PO TABS: 5.0000 mg | ORAL_TABLET | Freq: Four times a day (QID) | ORAL | Status: DC | PRN

## 2010-10-22 NOTE — Telephone Encounter (Signed)
Ok to refill #40, 5 refills

## 2010-10-22 NOTE — Telephone Encounter (Signed)
rx called to pharmacy 

## 2010-11-02 NOTE — Progress Notes (Signed)
ICD checked by remote. 

## 2010-11-13 ENCOUNTER — Other Ambulatory Visit: Payer: Self-pay | Admitting: *Deleted

## 2010-11-13 NOTE — Telephone Encounter (Signed)
Received faxed refill request from pharmacy. Is it okay to refill medication? 

## 2010-11-13 NOTE — Telephone Encounter (Signed)
Yes, #50, 2 refills

## 2010-11-14 MED ORDER — HYDROCODONE-ACETAMINOPHEN 5-500 MG PO TABS: 1.0000 | ORAL_TABLET | Freq: Four times a day (QID) | ORAL | Status: DC | PRN

## 2010-11-14 NOTE — Telephone Encounter (Signed)
rx called to pharmacy 

## 2010-11-14 NOTE — Telephone Encounter (Signed)
This should have been done yest. Please check.

## 2010-11-16 ENCOUNTER — Encounter: Payer: Self-pay | Admitting: *Deleted

## 2010-11-22 ENCOUNTER — Encounter: Payer: Federal, State, Local not specified - PPO | Admitting: *Deleted

## 2010-11-26 ENCOUNTER — Encounter: Payer: Self-pay | Admitting: *Deleted

## 2010-11-28 ENCOUNTER — Telehealth: Payer: Self-pay | Admitting: Radiology

## 2010-11-28 NOTE — Telephone Encounter (Signed)
[facility] notified us that this patient never returned the ifob stool kit. [facility] will bill them $5.27 for the kit. 

## 2010-11-28 NOTE — Telephone Encounter (Signed)
The patient can make that decision regarding his healthcare.

## 2010-12-11 ENCOUNTER — Other Ambulatory Visit: Payer: Self-pay | Admitting: Family Medicine

## 2010-12-21 ENCOUNTER — Telehealth: Payer: Self-pay | Admitting: *Deleted

## 2010-12-21 ENCOUNTER — Ambulatory Visit (INDEPENDENT_AMBULATORY_CARE_PROVIDER_SITE_OTHER): Payer: Federal, State, Local not specified - PPO | Admitting: Family Medicine

## 2010-12-21 ENCOUNTER — Encounter: Payer: Self-pay | Admitting: Family Medicine

## 2010-12-21 VITALS — BP 130/78 | HR 94 | Temp 97.5°F | Ht 63.0 in | Wt 152.1 lb

## 2010-12-21 DIAGNOSIS — R109 Unspecified abdominal pain: Secondary | ICD-10-CM

## 2010-12-21 LAB — COMPREHENSIVE METABOLIC PANEL
Alkaline Phosphatase: 71 U/L (ref 39–117)
Creat: 1.1 mg/dL (ref 0.50–1.35)
Glucose, Bld: 103 mg/dL — ABNORMAL HIGH (ref 70–99)
Sodium: 141 mEq/L (ref 135–145)
Total Bilirubin: 0.4 mg/dL (ref 0.3–1.2)
Total Protein: 6.7 g/dL (ref 6.0–8.3)

## 2010-12-21 NOTE — Telephone Encounter (Signed)
Pt needs to be seen.. Add him on to my schedule today.. Have him come as early as able after 2... Notify him her will have to wait until I can work him in.

## 2010-12-21 NOTE — Assessment & Plan Note (Signed)
Chronic as opposed to acute as believed when pt worked in. On exam .. Most consistent with MSK strain. Will begin by obtaining urine sample to rule out infection, stone etc. Will check liver and kidney function although expect this is normal.  pt to use vicodin prn pain. Follow up with Dr. Salena Saner if not improving.

## 2010-12-21 NOTE — Progress Notes (Signed)
  Subjective:    Patient ID: [name], male    DOB: Nov 03, 1945, 65 y.o.   MRN: [id number]  HPI  65 year old male with history of CAD, afib, cervical herniated disc, headache and CHF presents with  Constant right flank pain. Ongoing for quite sometime but more severe in past few months.  Episodes of more severe pain occ. Hurts more when lying prone or puts pressure on it, mild tenderness in central thoracic spine.  Vicodin and sleeping pills last night helped this pain last night.  No nausea, no vomiting. No abdominal pain now, occ occuring not related to time of pain of eating or flank pain. No low back pain.  No current pain right now today.  Was hospitalized in 05/2010 for renal failure secondary to fluid pill. Potassium was very high.. Not currently on fluid pill.  He initially noted pain in that area after gallbladder removed in 02/2010. He is on    Review of Systems  Constitutional: Negative for fever and fatigue.  HENT: Positive for ear pain.   Eyes: Negative for pain.  Respiratory: Negative for cough and shortness of breath.   Cardiovascular: Negative for chest pain, palpitations and leg swelling.  Gastrointestinal: Negative for diarrhea, constipation, blood in stool and abdominal distention.  Genitourinary: Negative for urgency, hematuria, penile pain and testicular pain.       Objective:   Physical Exam  Constitutional: Vital signs are normal. He appears well-developed and well-nourished.  HENT:  Head: Normocephalic.  Right Ear: Hearing normal.  Left Ear: Hearing normal.  Nose: Nose normal.  Mouth/Throat: Oropharynx is clear and moist and mucous membranes are normal.  Neck: Trachea normal. Carotid bruit is not present. No mass and no thyromegaly present.  Cardiovascular: Normal rate, regular rhythm and normal pulses.  Exam reveals no gallop, no distant heart sounds and no friction rub.   No murmur heard.      No peripheral edema  Pulmonary/Chest: Effort  normal and breath sounds normal. No respiratory distress.  Abdominal: Normal appearance and bowel sounds are normal. There is tenderness in the right upper quadrant. There is CVA tenderness.       Focal ttp over central thoracic spine as well as laterally over focal intramuscular region.   Mild RUQ ttp.  Skin: Skin is warm, dry and intact. No rash noted.  Psychiatric: He has a normal mood and affect. His speech is normal and behavior is normal. Thought content normal.          Assessment & Plan:

## 2010-12-21 NOTE — Telephone Encounter (Signed)
Patient calling asking to be seen today because of abdominal pain around his kidneys, per pt he was in renal failure before and thinks this pain has something to do with renal failure, pt wanted to come in just for labs and I advised we couldn't just do labs he would need to be seen. I advised we do not have any appts available today and that he might have to go to the ED or urgent care depending on his pain, he states he's been taking flexeril and hydrocodone and it's not helping. Pt would like an appt for Monday with Dr.Copland if possible, I advised Dr.Copland won't be in until the afternoon and nothing is available then, that I would have to ask for an appt.

## 2010-12-21 NOTE — Patient Instructions (Signed)
Return urine today if able.  We will call you with lab results.  Use vicodin and skelaxin prn pain. Heat on area, gentle stretching.   Follow up if not improving with Dr. Patsy Lager in next few weeks.

## 2010-12-21 NOTE — Telephone Encounter (Signed)
Patient advised and will be her at 2

## 2010-12-24 ENCOUNTER — Telehealth: Payer: Self-pay | Admitting: *Deleted

## 2010-12-24 LAB — POCT URINALYSIS DIPSTICK
Leukocytes, UA: NEGATIVE
Protein, UA: NEGATIVE
Spec Grav, UA: 1.02
Urobilinogen, UA: NEGATIVE
pH, UA: 6

## 2010-12-24 NOTE — Telephone Encounter (Signed)
Can you mail him a handout on "celiac disease" from the epic patient handout section.  Celiac disease is caused by gluten intolerance -- [name] is going to mail you some information about this condition.

## 2010-12-24 NOTE — Telephone Encounter (Signed)
Pt is asking for the symptoms of gluten allergies and if cilliac disease is caused by gluten.  He saw something about this on television.

## 2010-12-25 NOTE — Telephone Encounter (Signed)
Not yes need help doing this!

## 2010-12-25 NOTE — Telephone Encounter (Signed)
[name], has this been done?

## 2010-12-26 NOTE — Telephone Encounter (Signed)
Information mailed to patient

## 2011-01-03 ENCOUNTER — Ambulatory Visit (INDEPENDENT_AMBULATORY_CARE_PROVIDER_SITE_OTHER): Payer: Federal, State, Local not specified - PPO | Admitting: Family Medicine

## 2011-01-03 ENCOUNTER — Encounter: Payer: Self-pay | Admitting: Family Medicine

## 2011-01-03 DIAGNOSIS — R109 Unspecified abdominal pain: Secondary | ICD-10-CM
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
## 2011-01-03 NOTE — Patient Instructions (Signed)
I'll talk to Dr. Patsy Lager then one of [place] will notify you. Take care.

## 2011-01-04 ENCOUNTER — Encounter: Payer: Self-pay | Admitting: Family Medicine

## 2011-01-04 NOTE — Assessment & Plan Note (Signed)
Prev labs reviewed.  Benign exam today.  I told pt that I'd discuss his case with PMD and then clinic would notify pt.  He agrees.  No change in meds at this point.

## 2011-01-16 ENCOUNTER — Other Ambulatory Visit: Payer: Self-pay | Admitting: *Deleted

## 2011-01-16 ENCOUNTER — Encounter: Payer: Federal, State, Local not specified - PPO | Admitting: *Deleted

## 2011-01-16 MED ORDER — HYDROCODONE-ACETAMINOPHEN 5-500 MG PO TABS: 1.0000 | ORAL_TABLET | Freq: Four times a day (QID) | ORAL | Status: DC | PRN

## 2011-01-16 NOTE — Telephone Encounter (Signed)
rx called to pharmacy 

## 2011-01-16 NOTE — Telephone Encounter (Signed)
Ok to refill #50, 2 refills 

## 2011-01-21 ENCOUNTER — Encounter: Payer: Self-pay | Admitting: Internal Medicine

## 2011-01-21 ENCOUNTER — Ambulatory Visit (INDEPENDENT_AMBULATORY_CARE_PROVIDER_SITE_OTHER): Payer: Federal, State, Local not specified - PPO | Admitting: *Deleted

## 2011-01-21 DIAGNOSIS — I5023 Acute on chronic systolic (congestive) heart failure: Secondary | ICD-10-CM

## 2011-01-21 LAB — ICD DEVICE OBSERVATION
BATTERY VOLTAGE: 2.64 V
BRDY-0002RV: 40 {beats}/min
CHARGE TIME: 10.43 s
DEV-0020ICD: NEGATIVE
PACEART VT: 0
RV LEAD AMPLITUDE: 6.3 mv
TZAT-0001FASTVT: 1
TZAT-0001FASTVT: 4
TZAT-0001FASTVT: 5
TZAT-0001FASTVT: 6
TZAT-0001SLOWVT: 1
TZAT-0001SLOWVT: 2
TZAT-0002FASTVT: NEGATIVE
TZAT-0002FASTVT: NEGATIVE
TZAT-0002FASTVT: NEGATIVE
TZAT-0002FASTVT: NEGATIVE
TZAT-0002FASTVT: NEGATIVE
TZAT-0005SLOWVT: 91 pct
TZAT-0012FASTVT: 200 ms
TZAT-0012FASTVT: 200 ms
TZAT-0012FASTVT: 200 ms
TZAT-0013SLOWVT: 2
TZAT-0013SLOWVT: 2
TZAT-0018FASTVT: NEGATIVE
TZAT-0018FASTVT: NEGATIVE
TZAT-0018FASTVT: NEGATIVE
TZAT-0018SLOWVT: NEGATIVE
TZAT-0018SLOWVT: NEGATIVE
TZAT-0019FASTVT: 8 V
TZAT-0019FASTVT: 8 V
TZAT-0019FASTVT: 8 V
TZAT-0019SLOWVT: 8 V
TZAT-0019SLOWVT: 8 V
TZAT-0020FASTVT: 1.6 ms
TZAT-0020FASTVT: 1.6 ms
TZAT-0020SLOWVT: 1.6 ms
TZAT-0020SLOWVT: 1.6 ms
TZON-0004SLOWVT: 16
TZON-0005SLOWVT: 12
TZON-0008FASTVT: 0 ms
TZON-0008SLOWVT: 0 ms
TZON-0011AFLUTTER: 70
TZST-0001SLOWVT: 3
TZST-0001SLOWVT: 6
TZST-0003SLOWVT: 35 J
VENTRICULAR PACING ICD: 0 pct

## 2011-01-24 ENCOUNTER — Encounter: Payer: Federal, State, Local not specified - PPO | Admitting: *Deleted

## 2011-02-08 ENCOUNTER — Telehealth: Payer: Self-pay | Admitting: Internal Medicine

## 2011-02-08 NOTE — Telephone Encounter (Signed)
C/o rib cage pain, abd distention, "feels crowded". Denies sob and can lay down at HS. Advised to avoid sodium intake, food list reviewed. Take daily weight and to call back if increased problems, app made. Pt accepting of app and recommendation and will call with further concerns.

## 2011-02-08 NOTE — Telephone Encounter (Signed)
Pt wants to see Dr. [name] but next avail is not til Feb and pt feels he has fluid build up and wants to be see by Dr. [name] sooner

## 2011-02-12 ENCOUNTER — Encounter: Payer: Self-pay | Admitting: Nurse Practitioner

## 2011-02-12 ENCOUNTER — Ambulatory Visit (INDEPENDENT_AMBULATORY_CARE_PROVIDER_SITE_OTHER): Payer: Federal, State, Local not specified - PPO | Admitting: Nurse Practitioner

## 2011-02-12 VITALS — BP 158/102 | HR 66 | Ht 63.0 in | Wt 155.0 lb

## 2011-02-12 DIAGNOSIS — I2589 Other forms of chronic ischemic heart disease: Secondary | ICD-10-CM

## 2011-02-12 DIAGNOSIS — I255 Ischemic cardiomyopathy: Secondary | ICD-10-CM

## 2011-02-12 DIAGNOSIS — I5023 Acute on chronic systolic (congestive) heart failure: Secondary | ICD-10-CM

## 2011-02-12 MED ORDER — LOSARTAN POTASSIUM 50 MG PO TABS: 50.0000 mg | ORAL_TABLET | Freq: Every day | ORAL | Status: DC

## 2011-02-12 NOTE — Patient Instructions (Signed)
We are going to check your labs today.  Increase your Cozaar to 50 mg each day. I have sent the prescription to the drug store. Take two of your 25 mg tabs to use up.  I will see you in 2 weeks.  Go to [facility] tomorrow for a chest Xray anytime between 8 am and 4 pm.  Call the [facility] office at [phone] if you have any questions, problems or concerns.

## 2011-02-12 NOTE — Progress Notes (Signed)
Leda Min Date of Birth: 09/25/1945 Medical Record #[id number]  History of Present Illness: [name] is seen today for a work in visit. He is seen for Dr. [name]. He has an ischemic cardiomyopathy. He says his EF is about 26%. He has his ICD in place. He comes in with a several week history of feeling bloated. This has been going on since Christmas. He says he is watching his salt but in the next breath, says he has had [place] ham. No chest pain. Not really short of breath. Weight has been stable. No swelling in his legs. No cough, PND or orthopnea. Just feels bloated in his abdomen. Blood pressure is up. He is on low dose ARB. Has been using some decongestants and he was cautioned about that. He had ARF back in April after getting Lasix and potassium.   Current Outpatient Prescriptions on File Prior to Visit  Medication Sig Dispense Refill  . albuterol (PROVENTIL HFA;VENTOLIN HFA) 108 (90 BASE) MCG/ACT inhaler Inhale 2 puffs into the lungs every 4 (four) hours as needed.        [name] aspirin 325 MG EC tablet Take 325 mg by mouth daily.        . carvedilol (COREG) 12.5 MG tablet TAKE 1 TAB TWO TIMES A DAY  60 tablet  6  . diazepam (VALIUM) 5 MG tablet Take 1 tablet (5 mg total) by mouth every 6 (six) hours as needed.  40 tablet  5  . digoxin (LANOXIN) 0.125 MG tablet Take 1 tablet (125 mcg total) by mouth daily.  30 tablet  11  . HYDROcodone-acetaminophen (VICODIN) 5-500 MG per tablet Take 1 tablet by mouth every 6 (six) hours as needed.  50 tablet  2  . metaxalone (SKELAXIN) 800 MG tablet Take 800 mg by mouth as needed.       . nitroGLYCERIN (NITROSTAT) 0.4 MG SL tablet Place 0.4 mg under the tongue every 5 (five) minutes as needed.        . promethazine (PHENERGAN) 25 MG tablet TAKE 1 TABLET BY MOUTH EVERY 8 HOURS AS NEEDED FOR NAUSEA  30 tablet  1  . Pseudoephedrine HCl (SUDAFED 12 HOUR PO) Take 2 tablets by mouth as needed.        . traMADol (ULTRAM) 50 MG tablet TAKE 1 TAB FOUR  TIMES A DAY AS NEEDED FOR PAIN  60 tablet  5  . zolpidem (AMBIEN CR) 12.5 MG CR tablet Take 1 tablet (12.5 mg total) by mouth at bedtime as needed.  30 tablet  5  . DISCONTD: losartan (COZAAR) 25 MG tablet Take 1 tablet (25 mg total) by mouth daily.  30 tablet  11    Allergies  Allergen Reactions  . Codeine   . Penicillins   . Ace Inhibitors     Cough    Past Medical History  Diagnosis Date  . Acute myocardial infarction, unspecified site, episode of care unspecified   . Paroxysmal ventricular tachycardia   . Atrial fibrillation   . ICD (implantable cardiac defibrillator) in place   . Congestive heart failure, unspecified     Reports EF of 26%.   [name] CAD (coronary artery disease), autologous vein bypass graft   . Chronic airway obstruction, not elsewhere classified   . Allergic rhinitis, cause unspecified   . Pure hypercholesterolemia   . Noncompliance     Past Surgical History  Procedure Date  . Cardiac defibrillator placement   . Coronary angioplasty with stent placement 1998  .  Rotator cuff repair   . Cholecystectomy, laparoscopic 02/2010    History  Smoking status  . Current Everyday Smoker -- 4.0 packs/day for 150 years  . Types: Cigarettes  Smokeless tobacco  . Not on file  Comment: NOW DOWN TO 3 CIGS A DAY    History  Alcohol Use No    Family History  Problem Relation Age of Onset  . Diabetes Father     Review of Systems: The review of systems is per the HPI. All other systems were reviewed and are negative.  Physical Exam: BP 158/102  Pulse 66  Ht 5\' 3"  (1.6 m)  Wt 155 lb (70.308 kg)  BMI 27.46 kg/m2 Patient is very pleasant and in no acute distress. Color looks a little sallow. Skin is warm and dry.  HEENT is unremarkable. Normocephalic/atraumatic. PERRL. Sclera are nonicteric. Neck is supple. No masses. No JVD. Lungs are clear. Cardiac exam shows a regular rate and rhythm. No S3 that I can appreciate. Abdomen is soft and nontender. Extremities are  without edema. Gait and ROM are intact. No gross neurologic deficits noted.   LABORATORY DATA: PENDING  Assessment / Plan:

## 2011-02-12 NOTE — Assessment & Plan Note (Signed)
I have spoken with Dr. [name] regarding this case. I am hesitant to restart diuretics with his history of ARF. He is in agreement with me. We will first check BMET and BNP and then consider additional therapy. We will send him for a CXR (he cannot go until tomorrow). I have increased the Cozaar to 50 mg daily. I would like to see him back in 2 weeks. Salt restriction is encouraged. Smoking cessation is encouraged as well. I have asked him to refrain from his use of decongestants. Patient is agreeable to this plan and will call if any problems develop in the interim.

## 2011-02-13 ENCOUNTER — Ambulatory Visit
Admission: RE | Admit: 2011-02-13 | Discharge: 2011-02-13 | Disposition: A | Discharge status: Home / Self Care | Payer: Federal, State, Local not specified - PPO | Source: Ambulatory Visit | Attending: Nurse Practitioner | Admitting: Nurse Practitioner

## 2011-02-13 DIAGNOSIS — I517 Cardiomegaly: Secondary | ICD-10-CM | POA: Diagnosis not present

## 2011-02-13 DIAGNOSIS — I5023 Acute on chronic systolic (congestive) heart failure: Secondary | ICD-10-CM

## 2011-02-13 DIAGNOSIS — R0789 Other chest pain: Secondary | ICD-10-CM | POA: Diagnosis not present

## 2011-02-13 LAB — CBC WITH DIFFERENTIAL/PLATELET
Basophils Absolute: 0.1 10*3/uL (ref 0.0–0.1)
Basophils Relative: 1 % (ref 0.0–3.0)
Eosinophils Absolute: 0.3 10*3/uL (ref 0.0–0.7)
Eosinophils Relative: 3.3 % (ref 0.0–5.0)
HCT: 41.5 % (ref 39.0–52.0)
Hemoglobin: 13.9 g/dL (ref 13.0–17.0)
Lymphocytes Relative: 24.4 % (ref 12.0–46.0)
Lymphs Abs: 2.4 10*3/uL (ref 0.7–4.0)
MCHC: 33.6 g/dL (ref 30.0–36.0)
MCV: 90.5 fl (ref 78.0–100.0)
Monocytes Absolute: 0.7 10*3/uL (ref 0.1–1.0)
Monocytes Relative: 6.6 % (ref 3.0–12.0)
Neutro Abs: 6.5 10*3/uL (ref 1.4–7.7)
Neutrophils Relative %: 64.7 % (ref 43.0–77.0)
Platelets: 303 10*3/uL (ref 150.0–400.0)
RBC: 4.59 Mil/uL (ref 4.22–5.81)
RDW: 15.5 % — ABNORMAL HIGH (ref 11.5–14.6)
WBC: 10 10*3/uL (ref 4.5–10.5)

## 2011-02-13 LAB — BASIC METABOLIC PANEL
BUN: 16 mg/dL (ref 6–23)
CO2: 28 mEq/L (ref 19–32)
Calcium: 9 mg/dL (ref 8.4–10.5)
Chloride: 107 mEq/L (ref 96–112)
Creatinine, Ser: 0.9 mg/dL (ref 0.4–1.5)
GFR: 87.3 mL/min (ref >60.00)
Glucose, Bld: 95 mg/dL (ref 70–99)
Potassium: 4.9 mEq/L (ref 3.5–5.1)
Sodium: 141 mEq/L (ref 135–145)

## 2011-02-13 LAB — BRAIN NATRIURETIC PEPTIDE: Pro B Natriuretic peptide (BNP): 291 pg/mL — ABNORMAL HIGH (ref 0.0–100.0)

## 2011-02-14 ENCOUNTER — Other Ambulatory Visit: Payer: Self-pay | Admitting: *Deleted

## 2011-02-14 MED ORDER — FUROSEMIDE 20 MG PO TABS: ORAL_TABLET | ORAL | Status: DC

## 2011-02-20 ENCOUNTER — Ambulatory Visit (INDEPENDENT_AMBULATORY_CARE_PROVIDER_SITE_OTHER): Payer: Federal, State, Local not specified - PPO | Admitting: Family Medicine

## 2011-02-20 ENCOUNTER — Encounter: Payer: Self-pay | Admitting: Family Medicine

## 2011-02-20 VITALS — BP 110/70 | HR 65 | Temp 98.0°F | Wt 151.5 lb

## 2011-02-20 DIAGNOSIS — R935 Abnormal findings on diagnostic imaging of other abdominal regions, including retroperitoneum: Secondary | ICD-10-CM

## 2011-02-20 DIAGNOSIS — J441 Chronic obstructive pulmonary disease with (acute) exacerbation: Secondary | ICD-10-CM | POA: Diagnosis not present

## 2011-02-20 DIAGNOSIS — N289 Disorder of kidney and ureter, unspecified: Secondary | ICD-10-CM | POA: Diagnosis not present

## 2011-02-20 DIAGNOSIS — J111 Influenza due to unidentified influenza virus with other respiratory manifestations: Secondary | ICD-10-CM | POA: Diagnosis not present

## 2011-02-20 DIAGNOSIS — Z1211 Encounter for screening for malignant neoplasm of colon: Secondary | ICD-10-CM

## 2011-02-20 MED ORDER — PREDNISONE 20 MG PO TABS: 40.0000 mg | ORAL_TABLET | Freq: Every day | ORAL | Status: AC

## 2011-02-20 MED ORDER — LEVOFLOXACIN 500 MG PO TABS: 500.0000 mg | ORAL_TABLET | Freq: Every day | ORAL | Status: AC

## 2011-02-20 NOTE — Patient Instructions (Signed)
REFERRAL: GO THE THE FRONT ROOM AT THE ENTRANCE OF OUR CLINIC, NEAR CHECK IN. ASK FOR [name]. SHE WILL HELP YOU SET UP YOUR REFERRAL. DATE: TIME:  

## 2011-02-20 NOTE — Progress Notes (Signed)
  Patient Name: [name] Date of Birth: [birth year] Age: 66 y.o. Medical Record Number: [id number] Gender: male Date of Encounter: 02/20/2011  History of Present Illness:  [name] is a 66 y.o. very pleasant male patient who presents with the following:  Cough: sick and aching all over. Chills. Coughing and sob.  Patient presents with 2-3 days of severe chills, pale, coughing and short of breath. He is feeling poorly and aching all over. No significant sore throat or earache. No nausea or diarrhea. No vomiting. He is tolerating all of his medications. He is mildly short of breath and has sputum productive.  Flank pain:he has had some persistent right-sided flank pain. This actually has improved. He has had a various workup about this. About 6 months or so ago, the patient did have a renal protocol CT at urology, and as I review these noticed today, they did want a followup imaging with contrast to evaluate this renal lesion.   Past Medical History, Surgical History, Social History, Family History, Problem List, Medications, and Allergies have been reviewed and updated if relevant.  Review of Systems: Fever. Chills. Myalgias. Shortness of breath. Productive sputum. No chest pain. No abdominal pain.  Physical Examination: Filed Vitals:   02/20/11 1237  BP: 110/70  Pulse: 65  Temp: 98 F (36.7 C)  TempSrc: Oral  Weight: 151 lb 8 oz (68.72 kg)    There is no height on file to calculate BMI.   Gen: WDWN, NAD; A & O x3, cooperative. Pleasant.Globally Non-toxic HEENT: Normocephalic and atraumatic. Throat clear, w/o exudate, R TM clear, L TM - good landmarks, No fluid present. rhinnorhea. No frontal or maxillary sinus T. MMM NECK: Anterior cervical  LAD is present CV: RRR, No M/G/R, cap refill <2 sec PULM: occ wheezing. Scattered rhonchi without focal crackles. ABD: S,NT,ND,+BS. No HSM. No rebound. EXT: No c/c/e PSYCH: Friendly, good eye contact MSK: Nml  gait   Assessment and Plan: 1. COPD exacerbation  predniSONE (DELTASONE) 20 MG tablet, Ambulatory referral to Gastroenterology  2. Influenza  predniSONE (DELTASONE) 20 MG tablet, Ambulatory referral to Gastroenterology  3. Abnormal CT of the abdomen  CT Abdomen Pelvis W Wo Contrast  4. Renal lesion  CT Abdomen Pelvis W Wo Contrast  5. Special screening for malignant neoplasm of colon  Ambulatory referral to Gastroenterology    Influenza with respiratory involvement. Associated chronic obstructive pulmonary disease exacerbation.  Prior abnormal CT of the abdomen noted from [facility] - patient did not followup regarding this. Question is regarding renal lesion, and reading radiologist recommended followup CT of the abdomen with and without contrast to a body weight. We have set this up today for the patient.  Flank pain, this is resolved. Persistent flank pain for some time. Status post cholecystectomy.  Referral for screening colonoscopy. Discussed this importance with the patient. He is at various abdominal complaints in the past,.  History of noncompliance. I urged the importance of these followup studies and tried to explain their implications to the best of my ability.

## 2011-02-22 ENCOUNTER — Other Ambulatory Visit: Payer: Self-pay | Admitting: Family Medicine

## 2011-02-24 ENCOUNTER — Other Ambulatory Visit: Payer: Self-pay | Admitting: Family Medicine

## 2011-02-25 ENCOUNTER — Other Ambulatory Visit: Payer: Self-pay | Admitting: *Deleted

## 2011-02-25 MED ORDER — ZOLPIDEM TARTRATE ER 12.5 MG PO TBCR: 12.5000 mg | EXTENDED_RELEASE_TABLET | Freq: Every evening | ORAL | Status: DC | PRN

## 2011-02-25 NOTE — Telephone Encounter (Signed)
Ok, 30, 5 refills. 

## 2011-02-25 NOTE — Telephone Encounter (Signed)
Rx called to pharmacy

## 2011-02-25 NOTE — Telephone Encounter (Signed)
Electronic refill request

## 2011-02-26 ENCOUNTER — Ambulatory Visit: Payer: Federal, State, Local not specified - PPO | Admitting: Nurse Practitioner

## 2011-03-05 ENCOUNTER — Telehealth: Payer: Self-pay | Admitting: Family Medicine

## 2011-03-05 NOTE — Telephone Encounter (Signed)
Flu can take 3-4 weeks. If he is febrile, short of breath at all, I would be happy to recheck him

## 2011-03-05 NOTE — Telephone Encounter (Signed)
Pt has had flu for 2 weeks and needs another RX because the previous 2 RX perscribed are not working.

## 2011-03-05 NOTE — Telephone Encounter (Signed)
Patient has appt tomorrow to recheck with you

## 2011-03-06 ENCOUNTER — Ambulatory Visit (INDEPENDENT_AMBULATORY_CARE_PROVIDER_SITE_OTHER)
Admission: RE | Admit: 2011-03-06 | Discharge: 2011-03-06 | Disposition: A | Discharge status: Home / Self Care | Payer: Federal, State, Local not specified - PPO | Source: Ambulatory Visit | Attending: Family Medicine | Admitting: Family Medicine

## 2011-03-06 ENCOUNTER — Ambulatory Visit (INDEPENDENT_AMBULATORY_CARE_PROVIDER_SITE_OTHER): Payer: Federal, State, Local not specified - PPO | Admitting: Family Medicine

## 2011-03-06 ENCOUNTER — Encounter: Payer: Self-pay | Admitting: Family Medicine

## 2011-03-06 VITALS — BP 110/72 | HR 95 | Temp 97.0°F | Ht 63.0 in | Wt 145.0 lb

## 2011-03-06 DIAGNOSIS — J441 Chronic obstructive pulmonary disease with (acute) exacerbation: Secondary | ICD-10-CM | POA: Diagnosis not present

## 2011-03-06 DIAGNOSIS — F17200 Nicotine dependence, unspecified, uncomplicated: Secondary | ICD-10-CM | POA: Diagnosis not present

## 2011-03-06 DIAGNOSIS — J111 Influenza due to unidentified influenza virus with other respiratory manifestations: Secondary | ICD-10-CM

## 2011-03-06 DIAGNOSIS — Z720 Tobacco use: Secondary | ICD-10-CM

## 2011-03-06 DIAGNOSIS — R05 Cough: Secondary | ICD-10-CM | POA: Diagnosis not present

## 2011-03-06 MED ORDER — PREDNISONE 20 MG PO TABS: ORAL_TABLET | ORAL | Status: AC

## 2011-03-06 NOTE — Progress Notes (Signed)
Patient Name: [name] Date of Birth: [birth year] Age: 65 y.o. Medical Record Number: [id number] Gender: male Date of Encounter: 03/06/2011  History of Present Illness:  [name] is a 66 y.o. very pleasant male patient who presents with the following:  Got marginally better, then went downhill. Has some chills and bodyaches. I saw him about 2 weeks ago, the patient generally looked very poorly. I thought that he likely had influenza, and had a mild COPD exacerbation. He has been continued to be very ill, decreased by mouth intake. He continues to have a very productive cough. He has been sick now for a little bit over 2 weeks. I actually last time gave him some Levaquin also, and thought that he could potentially have a superimposed pneumonia. He has been having some chills, body aches. No known fever. He has a very extensive history of tobacco abuse. COPD that is known.  Patient Active Problem List  Diagnoses  . HYPERLIPIDEMIA  . CAD, UNSPECIFIED SITE  . ATRIAL FIBRILLATION WITH RAPID VENTRICULAR RESPONSE  . ALLERGIC RHINITIS  . COPD, MILD  . AUTOMATIC IMPLANTABLE CARDIAC DEFIBRILLATOR SITU  . DYSPNEA  . Acute on chronic systolic heart failure  . Rectal fissure  . Flank pain  . Ischemic cardiomyopathy   Past Medical History  Diagnosis Date  . Acute myocardial infarction, unspecified site, episode of care unspecified   . Ventricular tachycardia, inducible   . Atrial fibrillation   . ICD (implantable cardiac defibrillator) in place   . Congestive heart failure, unspecified     Reports EF of 26%.   [name] CAD (coronary artery disease), autologous vein bypass graft   . Chronic airway obstruction, not elsewhere classified   . Allergic rhinitis, cause unspecified   . Pure hypercholesterolemia   . Noncompliance    Past Surgical History  Procedure Date  . Cardiac defibrillator placement   . Coronary angioplasty with stent placement 1998  . Rotator cuff repair   .  Cholecystectomy, laparoscopic 02/2010   History  Substance Use Topics  . Smoking status: Current Everyday Smoker -- 4.0 packs/day for 150 years    Types: Cigarettes  . Smokeless tobacco: Not on file   Comment: NOW DOWN TO 3 CIGS A DAY  . Alcohol Use: No   Family History  Problem Relation Age of Onset  . Diabetes Father    Allergies  Allergen Reactions  . Codeine   . Penicillins   . Ace Inhibitors     Cough   Current Outpatient Prescriptions on File Prior to Visit  Medication Sig Dispense Refill  . aspirin 325 MG EC tablet Take 325 mg by mouth daily.        . benzonatate (TESSALON) 200 MG capsule TAKE 1 CAPSULE BY MOUTH 3 TIMES DAILY AS NEEDED FOR COUGH  30 capsule  1  . carvedilol (COREG) 12.5 MG tablet Take 12.5 mg by mouth 2 (two) times daily with a meal.      . diazepam (VALIUM) 5 MG tablet Take 1 tablet (5 mg total) by mouth every 6 (six) hours as needed.  40 tablet  5  . digoxin (LANOXIN) 0.125 MG tablet Take 1 tablet (125 mcg total) by mouth daily.  30 tablet  11  . furosemide (LASIX) 20 MG tablet Take one by mouth for 3 days then as needed  30 tablet  11  . HYDROcodone-acetaminophen (VICODIN) 5-500 MG per tablet Take 1 tablet by mouth every 6 (six) hours as needed.  50 tablet  2  . losartan (COZAAR) 50 MG tablet Take 1 tablet (50 mg total) by mouth daily.  30 tablet  6  . metaxalone (SKELAXIN) 800 MG tablet Take 800 mg by mouth as needed.       . nitroGLYCERIN (NITROSTAT) 0.4 MG SL tablet Place 0.4 mg under the tongue every 5 (five) minutes as needed.        [name] PROAIR HFA 108 (90 BASE) MCG/ACT inhaler USE 2 PUFFS EVERY 4 HOURS AS NEEDED FOR SHORTNESS OF BREATH  8.5 g  1  . promethazine (PHENERGAN) 25 MG tablet TAKE 1 TABLET BY MOUTH EVERY 8 HOURS AS NEEDED FOR NAUSEA  30 tablet  1  . Pseudoephedrine HCl (SUDAFED 12 HOUR PO) Take 2 tablets by mouth as needed.        . traMADol (ULTRAM) 50 MG tablet TAKE 1 TAB FOUR TIMES A DAY AS NEEDED FOR PAIN  60 tablet  5  . zolpidem  (AMBIEN CR) 12.5 MG CR tablet Take 1 tablet (12.5 mg total) by mouth at bedtime as needed.  30 tablet  5     Past Medical History, Surgical History, Social History, Family History, Problem List, Medications, and Allergies have been reviewed and updated if relevant.  Review of Systems: ROS: GEN: Acute illness details above GI: Tolerating PO intake GU: maintaining adequate hydration and urination Pulm: some SOB Interactive and getting along well at home.  Otherwise, ROS is as per the HPI.   Physical Examination: Filed Vitals:   03/06/11 1150  BP: 110/72  Pulse: 95  Temp: 97 F (36.1 C)  TempSrc: Oral  Height: 5\' 3"  (1.6 m)  Weight: 145 lb (65.772 kg)  SpO2: 95%    Body mass index is 25.69 kg/(m^2).   GEN: A and O x 3. WDWN. NAD.    ENT: Nose clear, ext NML.  No LAD.  No JVD.  TM's clear. Oropharynx clear.  PULM: Normal WOB, no distress. Crackles in lower lobes. occ wheezing CV: RRR today, no M/G/R   EXT: warm and well-perfused, No c/c/e. PSYCH: Pleasant and conversant.   Assessment and Plan: 1. COPD exacerbation  DG Chest 2 View, predniSONE (DELTASONE) 20 MG tablet  2. Influenza with respiratory manifestations      CXR, AP and Lateral Indication: Cough, shortness or breath: Findings: No focal infiltrate  Likely also secondary to influenza. Prolonged course with COPD exacerbation. Reassuring chest xray.

## 2011-03-15 ENCOUNTER — Inpatient Hospital Stay
Admission: RE | Admit: 2011-03-15 | Payer: Federal, State, Local not specified - PPO | Source: Ambulatory Visit | Attending: Family Medicine | Admitting: Family Medicine

## 2011-03-18 DIAGNOSIS — H2510 Age-related nuclear cataract, unspecified eye: Secondary | ICD-10-CM | POA: Diagnosis not present

## 2011-03-19 ENCOUNTER — Ambulatory Visit: Payer: Federal, State, Local not specified - PPO | Admitting: Internal Medicine

## 2011-03-20 ENCOUNTER — Other Ambulatory Visit: Payer: Federal, State, Local not specified - PPO

## 2011-03-25 ENCOUNTER — Other Ambulatory Visit: Payer: Federal, State, Local not specified - PPO

## 2011-03-25 ENCOUNTER — Telehealth: Payer: Self-pay | Admitting: Family Medicine

## 2011-03-25 NOTE — Telephone Encounter (Signed)
Call  We were trying to f/u the CT he had last summer and a couple of lesions on his kidney, but it looks like he missed those appointments.  Does he not want to not evaluate further? I would not do that personally, but he can make his own decisions

## 2011-03-25 NOTE — Telephone Encounter (Signed)
Left message for patient to return my call.

## 2011-03-25 NOTE — Telephone Encounter (Signed)
Rose from [employer] called to let you know that this patient did not show for the FU CT Abd and Pelvis that you scheduled on 03/06/2011. She has called the patient and left messages on his machine but has not heard back from him as to the reason he didn't show. Please have Heather followup if possible. Just wanted you to know that he no-showed.

## 2011-03-28 NOTE — Telephone Encounter (Signed)
Left message for patient to return my call.

## 2011-04-01 ENCOUNTER — Encounter: Payer: Self-pay | Admitting: *Deleted

## 2011-04-01 NOTE — Telephone Encounter (Signed)
Will notify [name]

## 2011-04-01 NOTE — Telephone Encounter (Signed)
Would you like to send certified letter because patient will not return phone calls?

## 2011-04-01 NOTE — Telephone Encounter (Signed)
yes

## 2011-04-01 NOTE — Telephone Encounter (Signed)
LETTER GIVEN TO CYNTHIA

## 2011-04-07 ENCOUNTER — Other Ambulatory Visit: Payer: Self-pay | Admitting: Internal Medicine

## 2011-04-19 ENCOUNTER — Other Ambulatory Visit: Payer: Self-pay | Admitting: Family Medicine

## 2011-04-19 MED ORDER — HYDROCODONE-ACETAMINOPHEN 5-500 MG PO TABS: 1.0000 | ORAL_TABLET | Freq: Four times a day (QID) | ORAL | Status: DC | PRN

## 2011-04-19 NOTE — Telephone Encounter (Signed)
rx called to pharmacy 

## 2011-04-19 NOTE — Telephone Encounter (Signed)
Last ov 03-06-2011

## 2011-04-19 NOTE — Telephone Encounter (Signed)
Ok to refill tramadol #50, 5 refills  Vicodin as below, #50,  3 refills

## 2011-04-25 ENCOUNTER — Encounter: Payer: Federal, State, Local not specified - PPO | Admitting: *Deleted

## 2011-04-27 ENCOUNTER — Telehealth: Payer: Self-pay | Admitting: Family Medicine

## 2011-04-27 NOTE — Telephone Encounter (Signed)
Received signed domestic return receipt verifying delivery 04/09/11. rmf

## 2011-05-01 ENCOUNTER — Encounter: Payer: Self-pay | Admitting: *Deleted

## 2011-05-06 ENCOUNTER — Other Ambulatory Visit: Payer: Self-pay | Admitting: *Deleted

## 2011-05-06 MED ORDER — METAXALONE 800 MG PO TABS: 800.0000 mg | ORAL_TABLET | ORAL | Status: DC | PRN

## 2011-05-06 NOTE — Telephone Encounter (Signed)
Received faxed refill request from pharmacy. Is it okay to refill medication? 

## 2011-05-07 ENCOUNTER — Other Ambulatory Visit: Payer: Self-pay | Admitting: Family Medicine

## 2011-05-14 DIAGNOSIS — H2510 Age-related nuclear cataract, unspecified eye: Secondary | ICD-10-CM | POA: Diagnosis not present

## 2011-05-16 ENCOUNTER — Other Ambulatory Visit: Payer: Self-pay | Admitting: Internal Medicine

## 2011-05-16 NOTE — Telephone Encounter (Signed)
Refilled digoxin 

## 2011-06-05 ENCOUNTER — Other Ambulatory Visit: Payer: Self-pay | Admitting: *Deleted

## 2011-06-05 MED ORDER — LOSARTAN POTASSIUM 50 MG PO TABS: 50.0000 mg | ORAL_TABLET | Freq: Every day | ORAL | Status: DC

## 2011-06-05 MED ORDER — FUROSEMIDE 20 MG PO TABS: 20.0000 mg | ORAL_TABLET | ORAL | Status: DC | PRN

## 2011-06-06 ENCOUNTER — Other Ambulatory Visit: Payer: Self-pay | Admitting: *Deleted

## 2011-06-06 MED ORDER — CARVEDILOL 12.5 MG PO TABS: 12.5000 mg | ORAL_TABLET | Freq: Two times a day (BID) | ORAL | Status: DC

## 2011-06-06 MED ORDER — DIGOXIN 125 MCG PO TABS: 0.1250 mg | ORAL_TABLET | Freq: Every day | ORAL | Status: DC

## 2011-06-06 NOTE — Telephone Encounter (Signed)
Refilled Digoxin. 

## 2011-06-19 ENCOUNTER — Telehealth: Payer: Self-pay

## 2011-06-19 NOTE — Telephone Encounter (Signed)
Received faxed from [facility] for Zolpidem Tart ER 12.5 mg. Form from [employer] is in your in box for completion and fax back.

## 2011-06-19 NOTE — Telephone Encounter (Signed)
done

## 2011-06-21 NOTE — Telephone Encounter (Signed)
Approval letter faxed to [facility]; placed in Dr Brayton Layman in box for signature and then letter of approval and PA form will be sent for scanning.

## 2011-06-24 DIAGNOSIS — H2510 Age-related nuclear cataract, unspecified eye: Secondary | ICD-10-CM | POA: Diagnosis not present

## 2011-06-24 DIAGNOSIS — H269 Unspecified cataract: Secondary | ICD-10-CM | POA: Diagnosis not present

## 2011-07-01 ENCOUNTER — Encounter: Payer: Self-pay | Admitting: *Deleted

## 2011-07-12 ENCOUNTER — Ambulatory Visit (INDEPENDENT_AMBULATORY_CARE_PROVIDER_SITE_OTHER): Payer: Federal, State, Local not specified - PPO | Admitting: *Deleted

## 2011-07-12 ENCOUNTER — Encounter: Payer: Self-pay | Admitting: Internal Medicine

## 2011-07-12 DIAGNOSIS — I5023 Acute on chronic systolic (congestive) heart failure: Secondary | ICD-10-CM

## 2011-07-12 DIAGNOSIS — Z95810 Presence of automatic (implantable) cardiac defibrillator: Secondary | ICD-10-CM | POA: Diagnosis not present

## 2011-07-16 LAB — REMOTE ICD DEVICE
BATTERY VOLTAGE: 2.64 V
CHARGE TIME: 10.13 s
RV LEAD IMPEDENCE ICD: 448 Ohm
TZAT-0001FASTVT: 2
TZAT-0001FASTVT: 3
TZAT-0001FASTVT: 4
TZAT-0002FASTVT: NEGATIVE
TZAT-0002FASTVT: NEGATIVE
TZAT-0004SLOWVT: 8
TZAT-0004SLOWVT: 8
TZAT-0005SLOWVT: 84 pct
TZAT-0005SLOWVT: 91 pct
TZAT-0011SLOWVT: 10 ms
TZAT-0012FASTVT: 200 ms
TZAT-0012FASTVT: 200 ms
TZAT-0012FASTVT: 200 ms
TZAT-0012SLOWVT: 200 ms
TZAT-0012SLOWVT: 200 ms
TZAT-0013SLOWVT: 2
TZAT-0013SLOWVT: 2
TZAT-0018FASTVT: NEGATIVE
TZAT-0018FASTVT: NEGATIVE
TZAT-0018FASTVT: NEGATIVE
TZAT-0018FASTVT: NEGATIVE
TZAT-0019FASTVT: 8 V
TZAT-0019FASTVT: 8 V
TZAT-0019FASTVT: 8 V
TZAT-0020FASTVT: 1.6 ms
TZAT-0020FASTVT: 1.6 ms
TZAT-0020FASTVT: 1.6 ms
TZAT-0020FASTVT: 1.6 ms
TZAT-0020SLOWVT: 1.6 ms
TZON-0003SLOWVT: 330 ms
TZON-0004SLOWVT: 16
TZON-0008FASTVT: 0 ms
TZON-0011AFLUTTER: 70
TZST-0001SLOWVT: 5
TZST-0001SLOWVT: 6
TZST-0003SLOWVT: 30 J
TZST-0003SLOWVT: 35 J
TZST-0003SLOWVT: 35 J

## 2011-07-29 ENCOUNTER — Encounter: Payer: Self-pay | Admitting: *Deleted

## 2011-08-12 ENCOUNTER — Encounter: Payer: Medicare Other | Admitting: *Deleted

## 2011-08-15 ENCOUNTER — Other Ambulatory Visit: Payer: Self-pay | Admitting: *Deleted

## 2011-08-15 MED ORDER — ZOLPIDEM TARTRATE ER 12.5 MG PO TBCR: 12.5000 mg | EXTENDED_RELEASE_TABLET | Freq: Every evening | ORAL | Status: DC | PRN

## 2011-08-15 NOTE — Telephone Encounter (Signed)
Ok to refill 30, 5 refills 

## 2011-08-15 NOTE — Telephone Encounter (Signed)
rx called to pharmacy 

## 2011-09-18 ENCOUNTER — Other Ambulatory Visit: Payer: Self-pay | Admitting: Family Medicine

## 2011-09-18 NOTE — Telephone Encounter (Signed)
Refills sent to pharmacy, ok per Dr. Patsy Lager.

## 2011-09-27 ENCOUNTER — Other Ambulatory Visit: Payer: Self-pay | Admitting: Family Medicine

## 2011-09-27 NOTE — Telephone Encounter (Signed)
Ok to refill #40, 2 refills

## 2011-10-15 ENCOUNTER — Other Ambulatory Visit: Payer: Self-pay | Admitting: Nurse Practitioner

## 2011-11-04 ENCOUNTER — Other Ambulatory Visit: Payer: Self-pay | Admitting: *Deleted

## 2011-11-04 MED ORDER — DIAZEPAM 5 MG PO TABS: 5.0000 mg | ORAL_TABLET | Freq: Four times a day (QID) | ORAL | Status: DC | PRN

## 2011-11-04 NOTE — Telephone Encounter (Signed)
Ok to refill #40, 5 refills 

## 2011-11-09 ENCOUNTER — Other Ambulatory Visit: Payer: Self-pay | Admitting: Internal Medicine

## 2011-11-13 NOTE — Telephone Encounter (Signed)
Opened in error

## 2011-11-13 NOTE — Telephone Encounter (Signed)
[facility] called for refill on Diazepam. Pharmacist said did not get refill on 11/04/11. I gave verbal refill as instructed # 40 x 5.

## 2011-11-14 ENCOUNTER — Ambulatory Visit: Payer: Medicare Other | Admitting: Family Medicine

## 2011-11-14 ENCOUNTER — Ambulatory Visit (INDEPENDENT_AMBULATORY_CARE_PROVIDER_SITE_OTHER): Payer: Federal, State, Local not specified - PPO | Admitting: *Deleted

## 2011-11-14 ENCOUNTER — Telehealth: Payer: Self-pay | Admitting: Family Medicine

## 2011-11-14 DIAGNOSIS — I255 Ischemic cardiomyopathy: Secondary | ICD-10-CM

## 2011-11-14 DIAGNOSIS — Z95810 Presence of automatic (implantable) cardiac defibrillator: Secondary | ICD-10-CM | POA: Diagnosis not present

## 2011-11-14 DIAGNOSIS — I2589 Other forms of chronic ischemic heart disease: Secondary | ICD-10-CM

## 2011-11-14 LAB — REMOTE ICD DEVICE
BATTERY VOLTAGE: 2.62 V
BRDY-0002RV: 40 {beats}/min
DEV-0020ICD: NEGATIVE
RV LEAD AMPLITUDE: 4.9 mv
TZAT-0001FASTVT: 4
TZAT-0001FASTVT: 5
TZAT-0001SLOWVT: 1
TZAT-0001SLOWVT: 2
TZAT-0002FASTVT: NEGATIVE
TZAT-0002FASTVT: NEGATIVE
TZAT-0002FASTVT: NEGATIVE
TZAT-0004SLOWVT: 8
TZAT-0012FASTVT: 200 ms
TZAT-0012FASTVT: 200 ms
TZAT-0012FASTVT: 200 ms
TZAT-0013SLOWVT: 2
TZAT-0013SLOWVT: 2
TZAT-0018FASTVT: NEGATIVE
TZAT-0018FASTVT: NEGATIVE
TZAT-0018SLOWVT: NEGATIVE
TZAT-0018SLOWVT: NEGATIVE
TZAT-0019FASTVT: 8 V
TZAT-0019FASTVT: 8 V
TZAT-0019SLOWVT: 8 V
TZAT-0020FASTVT: 1.6 ms
TZAT-0020FASTVT: 1.6 ms
TZAT-0020FASTVT: 1.6 ms
TZAT-0020SLOWVT: 1.6 ms
TZAT-0020SLOWVT: 1.6 ms
TZON-0004SLOWVT: 16
TZON-0008FASTVT: 0 ms
TZST-0001SLOWVT: 3
TZST-0001SLOWVT: 5
TZST-0003SLOWVT: 35 J
VENTRICULAR PACING ICD: 1 pct

## 2011-11-14 NOTE — Telephone Encounter (Signed)
Caller: Nealy/Patient; Patient Name: [name]; PCP: Hannah Beat [facility]); Best Callback Phone Number: [phone], used Fleets enema for constipation 11/13/11, without result,  last bm 11/12/11, afebrile, no significant discomfort,  has 11/14/11 1515 appt scheduled  All emergent sx for Constipation Protocol R/o.  Encourage fluids.

## 2011-11-15 ENCOUNTER — Ambulatory Visit (INDEPENDENT_AMBULATORY_CARE_PROVIDER_SITE_OTHER): Payer: Federal, State, Local not specified - PPO | Admitting: Family Medicine

## 2011-11-15 ENCOUNTER — Ambulatory Visit: Payer: Medicare Other | Admitting: Family Medicine

## 2011-11-15 ENCOUNTER — Other Ambulatory Visit: Payer: Self-pay | Admitting: Internal Medicine

## 2011-11-15 ENCOUNTER — Encounter: Payer: Self-pay | Admitting: Family Medicine

## 2011-11-15 VITALS — BP 112/64 | HR 80 | Temp 97.6°F | Wt 148.0 lb

## 2011-11-15 DIAGNOSIS — R1032 Left lower quadrant pain: Secondary | ICD-10-CM

## 2011-11-15 MED ORDER — METRONIDAZOLE 500 MG PO TABS: 500.0000 mg | ORAL_TABLET | Freq: Three times a day (TID) | ORAL | Status: DC

## 2011-11-15 MED ORDER — CIPROFLOXACIN HCL 500 MG PO TABS: 500.0000 mg | ORAL_TABLET | Freq: Two times a day (BID) | ORAL | Status: DC

## 2011-11-15 MED ORDER — DIGOXIN 125 MCG PO TABS: 0.1250 mg | ORAL_TABLET | Freq: Every day | ORAL | Status: DC

## 2011-11-15 NOTE — Telephone Encounter (Signed)
Pt needs appointment then refill can be made Fax Received. Refill Completed. Phong Isenberg Chowoe (R.M.A)   

## 2011-11-15 NOTE — Patient Instructions (Addendum)
I do think this is diverticulitis Treat with clear liquid diet and start antibiotics prescribed today. CT scan next week - to check on that bowel wall thickening from last year as well as diverticulitis. If worsening over the weekend (fever >101, worsening abdominal pain despite antibiotics, or nausea/vomiting) go to ER to be checked out.

## 2011-11-15 NOTE — Assessment & Plan Note (Signed)
Diverticulitis vs other.  Marked LLQ pain with BM changes, but no fever.  Will treat as diverticulitis with cipro/flagyl course (PCN allergy). Also will draw blood work today. Recommended obtaining CT scan today - pt declines. Discussed abnormal finding on CT scan from last year - sigmoid colon wall thickening - and how recommendation remains colonoscopy to r/o malignancy. Will obtain CT scan for Monday to eval possible diverticulitis as well as f/u prior abnormal wall thickening. Discussed if remaining abnormal, will still recommend colonoscopy.  Pt endorses understanding. Discussed red flags to seek urgent care.

## 2011-11-15 NOTE — Progress Notes (Signed)
  Subjective:    Patient ID: [name], male    DOB: 1945-03-30, 66 y.o.   MRN: [id number]  HPI CC: abd pain  1 wk h/o lower abd discomfort (points to suprapubic region) and cramping associated with constipation.  Has been taking dulcolax (yesterday) which did help him have liquid stool but didn't resolve pain.  endorsing some bloating lower abdomen.  Appetite normal.  Staying well hydrated.  Denies fevers/chills, nausea/vomiting, dysuria, urgency or frequency, no blood in stool.  Passing gas well. No sick contacts at home. Smoker - 4 cig/day.  H/o diverticulitis, last flare was 10 yrs ago. Had CT scan 2012 by urology - showing abnormally thickened sigmoid colon.  Never had colonoscopy.  Refused in past.  Unsure if has had recent stool kit.  Weight stable. Wt Readings from Last 3 Encounters:  11/15/11 148 lb (67.132 kg)  03/06/11 145 lb (65.772 kg)  02/20/11 151 lb 8 oz (68.72 kg)   Past Medical History  Diagnosis Date  . Acute myocardial infarction, unspecified site, episode of care unspecified   . Paroxysmal ventricular tachycardia   . Atrial fibrillation   . ICD (implantable cardiac defibrillator) in place   . Congestive heart failure, unspecified     Reports EF of 26%.   [name] CAD (coronary artery disease), autologous vein bypass graft   . Chronic airway obstruction, not elsewhere classified   . Allergic rhinitis, cause unspecified   . HLD (hyperlipidemia)   . Noncompliance      Review of Systems Per HPI    Objective:   Physical Exam  Nursing note and vitals reviewed. Constitutional: He appears well-developed and well-nourished. No distress.  HENT:  Head: Normocephalic and atraumatic.  Mouth/Throat: Oropharynx is clear and moist. No oropharyngeal exudate.  Eyes: Conjunctivae normal and EOM are normal. Pupils are equal, round, and reactive to light.  Neck: Normal range of motion. Neck supple.  Cardiovascular: Normal rate and intact distal pulses.        iregular    Pulmonary/Chest: Effort normal and breath sounds normal.  Abdominal: Soft. Bowel sounds are normal. He exhibits no distension and no mass. There is no hepatosplenomegaly. There is tenderness (moderate pain to light palpation) in the suprapubic area and left lower quadrant. There is guarding (mild). There is no rebound and no CVA tenderness.  Musculoskeletal: He exhibits no edema.  Lymphadenopathy:    He has no cervical adenopathy.  Skin: Skin is warm and dry. No rash noted.  Psychiatric: He has a normal mood and affect.       Assessment & Plan:

## 2011-11-16 LAB — COMPREHENSIVE METABOLIC PANEL
Albumin: 4 g/dL (ref 3.5–5.2)
CO2: 30 mEq/L (ref 19–32)
Calcium: 8.9 mg/dL (ref 8.4–10.5)
Chloride: 102 mEq/L (ref 96–112)
Glucose, Bld: 100 mg/dL — ABNORMAL HIGH (ref 70–99)
Sodium: 140 mEq/L (ref 135–145)
Total Bilirubin: 0.9 mg/dL (ref 0.3–1.2)
Total Protein: 6.3 g/dL (ref 6.0–8.3)

## 2011-11-16 LAB — CBC WITH DIFFERENTIAL/PLATELET
Basophils Absolute: 0.1 10*3/uL (ref 0.0–0.1)
Lymphocytes Relative: 16 % (ref 12–46)
Lymphs Abs: 1.8 10*3/uL (ref 0.7–4.0)
MCV: 85.3 fL (ref 78.0–100.0)
Neutro Abs: 7.8 10*3/uL — ABNORMAL HIGH (ref 1.7–7.7)
Neutrophils Relative %: 70 % (ref 43–77)
Platelets: 256 10*3/uL (ref 150–400)
RBC: 5.05 MIL/uL (ref 4.22–5.81)
WBC: 11.2 10*3/uL — ABNORMAL HIGH (ref 4.0–10.5)

## 2011-11-27 ENCOUNTER — Encounter: Payer: Self-pay | Admitting: *Deleted

## 2011-12-01 ENCOUNTER — Other Ambulatory Visit: Payer: Self-pay | Admitting: Family Medicine

## 2011-12-03 ENCOUNTER — Other Ambulatory Visit: Payer: Medicare Other

## 2011-12-05 ENCOUNTER — Encounter: Payer: Self-pay | Admitting: Internal Medicine

## 2011-12-05 ENCOUNTER — Telehealth: Payer: Self-pay | Admitting: Internal Medicine

## 2011-12-05 ENCOUNTER — Other Ambulatory Visit: Payer: Medicare Other

## 2011-12-05 ENCOUNTER — Telehealth: Payer: Self-pay | Admitting: Family Medicine

## 2011-12-05 ENCOUNTER — Encounter: Payer: Self-pay | Admitting: *Deleted

## 2011-12-05 ENCOUNTER — Ambulatory Visit (INDEPENDENT_AMBULATORY_CARE_PROVIDER_SITE_OTHER): Payer: Federal, State, Local not specified - PPO | Admitting: Internal Medicine

## 2011-12-05 VITALS — BP 136/94 | HR 81 | Ht 63.0 in | Wt 148.0 lb

## 2011-12-05 DIAGNOSIS — I4891 Unspecified atrial fibrillation: Secondary | ICD-10-CM | POA: Diagnosis not present

## 2011-12-05 DIAGNOSIS — I255 Ischemic cardiomyopathy: Secondary | ICD-10-CM

## 2011-12-05 DIAGNOSIS — I2589 Other forms of chronic ischemic heart disease: Secondary | ICD-10-CM

## 2011-12-05 DIAGNOSIS — I5023 Acute on chronic systolic (congestive) heart failure: Secondary | ICD-10-CM

## 2011-12-05 NOTE — Telephone Encounter (Signed)
plz return call to patient [phone] regarding rescheduling upcoming hospital procedure.

## 2011-12-05 NOTE — Telephone Encounter (Signed)
Both Dr. Reece Agar and I have explained risks to him including cancer and death. He can make the choice for his own healthcare to refuse medical advice.

## 2011-12-05 NOTE — Assessment & Plan Note (Signed)
His ventricular rate remains somewhat elevated. He is asymptomatic. He will continue his current medical therapy. He continues to decline anticoagulation.

## 2011-12-05 NOTE — Telephone Encounter (Signed)
Patient called CT at [facility] and changed his CT from 10/29 to 10/31. Today he No Showed for the CT and Rose called the pt at home. He said he didn't feel like it today  And maybe he would call next week to reschedule.

## 2011-12-05 NOTE — Assessment & Plan Note (Signed)
His heart failure symptoms are class II to class III. He will continue his current medical therapy and maintain a low-sodium diet.

## 2011-12-05 NOTE — Patient Instructions (Addendum)
See instruction sheet for generator change out 

## 2011-12-05 NOTE — Progress Notes (Signed)
HPI [name] returns today for followup. He is a very pleasant 66 year old man with chronic systolic heart failure, atrial fibrillation, and an ischemic cardio myopathy. He is status post ICD implantation and has reached elective replacement indication on his device. The patient denies chest pain, shortness of breath, or syncope. No recent ICD shock. He has reached elective replacement. Allergies  Allergen Reactions  . Codeine   . Penicillins   . Ace Inhibitors     Cough     Current Outpatient Prescriptions  Medication Sig Dispense Refill  . aspirin 325 MG EC tablet Take 325 mg by mouth daily.        . benzonatate (TESSALON) 200 MG capsule TAKE 1 CAPSULE BY MOUTH 3 TIMES DAILY AS NEEDED FOR COUGH  30 capsule  1  . carvedilol (COREG) 12.5 MG tablet Take 1 tablet (12.5 mg total) by mouth 2 (two) times daily with a meal.  180 tablet  1  . ciprofloxacin (CIPRO) 500 MG tablet Take 1 tablet (500 mg total) by mouth 2 (two) times daily.  20 tablet  0  . diazepam (VALIUM) 5 MG tablet Take 1 tablet (5 mg total) by mouth every 6 (six) hours as needed.  40 tablet  5  . digoxin (LANOXIN) 0.125 MG tablet Take 1 tablet (0.125 mg total) by mouth daily.  30 tablet  1  . furosemide (LASIX) 20 MG tablet Take 1 tablet (20 mg total) by mouth as needed.  30 tablet  5  . HYDROcodone-acetaminophen (VICODIN) 5-500 MG per tablet Take 1 tablet by mouth every 6 (six) hours as needed.  50 tablet  3  . losartan (COZAAR) 50 MG tablet TAKE 1 TABLET (50 MG TOTAL) BY MOUTH DAILY.  30 tablet  5  . metaxalone (SKELAXIN) 800 MG tablet Take 1 tablet (800 mg total) by mouth as needed.  90 tablet  5  . metroNIDAZOLE (FLAGYL) 500 MG tablet Take 1 tablet (500 mg total) by mouth 3 (three) times daily.  30 tablet  0  . NITROSTAT 0.4 MG SL tablet 1 BY MOUTH AS NEEDED AS DIRECTED  25 tablet  11  . PROAIR HFA 108 (90 BASE) MCG/ACT inhaler USE 2 PUFFS EVERY 4 HOURS AS NEEDED FOR SHORTNESS OF BREATH  8.5 g  1  . promethazine (PHENERGAN)  25 MG tablet TAKE 1 TABLET BY MOUTH EVERY 8 HOURS AS NEEDED FOR NAUSEA  40 tablet  2  . Pseudoephedrine HCl (SUDAFED 12 HOUR PO) Take 2 tablets by mouth as needed.        . traMADol (ULTRAM) 50 MG tablet TAKE 1 TAB FOUR TIMES A DAY AS NEEDED FOR PAIN  50 tablet  5  . zolpidem (AMBIEN CR) 12.5 MG CR tablet Take 1 tablet (12.5 mg total) by mouth at bedtime as needed.  30 tablet  5  . DISCONTD: carvedilol (COREG) 12.5 MG tablet TAKE 1 TABLET BY MOUTH TWICE A DAY  180 tablet  0     Past Medical History  Diagnosis Date  . Acute myocardial infarction, unspecified site, episode of care unspecified   . Paroxysmal ventricular tachycardia   . Atrial fibrillation   . ICD (implantable cardiac defibrillator) in place   . Congestive heart failure, unspecified     Reports EF of 26%.   [name] CAD (coronary artery disease), autologous vein bypass graft   . Chronic airway obstruction, not elsewhere classified   . Allergic rhinitis, cause unspecified   . HLD (hyperlipidemia)   . Noncompliance   .  Diverticulosis     by CT scan    ROS:   All systems reviewed and negative except as noted in the HPI.   Past Surgical History  Procedure Date  . Cardiac defibrillator placement   . Coronary angioplasty with stent placement 1998  . Rotator cuff repair     right  . Cholecystectomy, laparoscopic 02/2010     Family History  Problem Relation Age of Onset  . Diabetes Father   . Stroke Mother   . Cancer Father     ?     History   Social History  . Marital Status: Married    Spouse Name: N/A    Number of Children: 0  . Years of Education: N/A   Occupational History  . 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 Smoking status: Current Every Day Smoker -- 4.0 packs/day for 150 years    Types: Cigarettes  . Smokeless tobacco: Not on file   Comment: NOW DOWN TO 4 CIGS A DAY  . Alcohol Use: No  . Drug Use: No  . Sexually Active: Not on file   Other Topics Concern  . Not on  file   Social History Narrative  . No narrative on file     BP 136/94  Pulse 81  Ht 5\' 3"  (1.6 m)  Wt 148 lb (67.132 kg)  BMI 26.22 kg/m2  SpO2 94%  Physical Exam:  Well appearing 66 year old man, NAD HEENT: Unremarkable Neck:  No JVD, no thyromegally Lungs:  Clear with no wheezes, rales, or rhonchi. HEART:  Regular rate rhythm, no murmurs, no rubs, no clicks Abd:  soft, positive bowel sounds, no organomegally, no rebound, no guarding Ext:  2 plus pulses, no edema, no cyanosis, no clubbing Skin:  No rashes no nodules Neuro:  CN II through XII intact, motor grossly intact device at elective replacement  DEVICE  Normal device function.  See PaceArt for details.   Assess/Plan: I

## 2011-12-05 NOTE — Telephone Encounter (Signed)
Will change procedure date to 01/10/12 same time

## 2011-12-09 ENCOUNTER — Encounter: Payer: Self-pay | Admitting: Internal Medicine

## 2011-12-11 ENCOUNTER — Telehealth: Payer: Self-pay | Admitting: Family Medicine

## 2011-12-11 ENCOUNTER — Inpatient Hospital Stay (HOSPITAL_COMMUNITY)
Admission: EM | Admit: 2011-12-11 | Discharge: 2011-12-13 | DRG: 377 | Disposition: A | Discharge status: Home / Self Care | Payer: Medicare Other | Attending: Internal Medicine | Admitting: Internal Medicine

## 2011-12-11 ENCOUNTER — Other Ambulatory Visit: Payer: Self-pay

## 2011-12-11 ENCOUNTER — Encounter (HOSPITAL_COMMUNITY): Payer: Self-pay | Admitting: General Practice

## 2011-12-11 ENCOUNTER — Emergency Department (HOSPITAL_COMMUNITY): Payer: Medicare Other

## 2011-12-11 DIAGNOSIS — Z95810 Presence of automatic (implantable) cardiac defibrillator: Secondary | ICD-10-CM

## 2011-12-11 DIAGNOSIS — Z9861 Coronary angioplasty status: Secondary | ICD-10-CM | POA: Diagnosis not present

## 2011-12-11 DIAGNOSIS — K2970 Gastritis, unspecified, without bleeding: Secondary | ICD-10-CM | POA: Diagnosis not present

## 2011-12-11 DIAGNOSIS — Z9119 Patient's noncompliance with other medical treatment and regimen: Secondary | ICD-10-CM

## 2011-12-11 DIAGNOSIS — J449 Chronic obstructive pulmonary disease, unspecified: Secondary | ICD-10-CM | POA: Diagnosis present

## 2011-12-11 DIAGNOSIS — I509 Heart failure, unspecified: Secondary | ICD-10-CM | POA: Diagnosis present

## 2011-12-11 DIAGNOSIS — K5732 Diverticulitis of large intestine without perforation or abscess without bleeding: Secondary | ICD-10-CM | POA: Diagnosis present

## 2011-12-11 DIAGNOSIS — I472 Ventricular tachycardia, unspecified: Secondary | ICD-10-CM | POA: Diagnosis present

## 2011-12-11 DIAGNOSIS — I4729 Other ventricular tachycardia: Secondary | ICD-10-CM | POA: Diagnosis present

## 2011-12-11 DIAGNOSIS — Z79899 Other long term (current) drug therapy: Secondary | ICD-10-CM | POA: Diagnosis not present

## 2011-12-11 DIAGNOSIS — K625 Hemorrhage of anus and rectum: Secondary | ICD-10-CM | POA: Diagnosis not present

## 2011-12-11 DIAGNOSIS — Z8719 Personal history of other diseases of the digestive system: Secondary | ICD-10-CM

## 2011-12-11 DIAGNOSIS — J4489 Other specified chronic obstructive pulmonary disease: Secondary | ICD-10-CM | POA: Diagnosis present

## 2011-12-11 DIAGNOSIS — K922 Gastrointestinal hemorrhage, unspecified: Principal | ICD-10-CM | POA: Diagnosis present

## 2011-12-11 DIAGNOSIS — E785 Hyperlipidemia, unspecified: Secondary | ICD-10-CM | POA: Diagnosis present

## 2011-12-11 DIAGNOSIS — R1032 Left lower quadrant pain: Secondary | ICD-10-CM | POA: Diagnosis not present

## 2011-12-11 DIAGNOSIS — Z7982 Long term (current) use of aspirin: Secondary | ICD-10-CM

## 2011-12-11 DIAGNOSIS — F17200 Nicotine dependence, unspecified, uncomplicated: Secondary | ICD-10-CM | POA: Diagnosis present

## 2011-12-11 DIAGNOSIS — R197 Diarrhea, unspecified: Secondary | ICD-10-CM | POA: Diagnosis not present

## 2011-12-11 DIAGNOSIS — I252 Old myocardial infarction: Secondary | ICD-10-CM

## 2011-12-11 DIAGNOSIS — Z91199 Patient's noncompliance with other medical treatment and regimen due to unspecified reason: Secondary | ICD-10-CM

## 2011-12-11 DIAGNOSIS — I255 Ischemic cardiomyopathy: Secondary | ICD-10-CM

## 2011-12-11 DIAGNOSIS — R109 Unspecified abdominal pain: Secondary | ICD-10-CM

## 2011-12-11 DIAGNOSIS — I10 Essential (primary) hypertension: Secondary | ICD-10-CM | POA: Diagnosis present

## 2011-12-11 DIAGNOSIS — I5023 Acute on chronic systolic (congestive) heart failure: Secondary | ICD-10-CM | POA: Diagnosis present

## 2011-12-11 DIAGNOSIS — D62 Acute posthemorrhagic anemia: Secondary | ICD-10-CM

## 2011-12-11 DIAGNOSIS — K602 Anal fissure, unspecified: Secondary | ICD-10-CM

## 2011-12-11 DIAGNOSIS — Z720 Tobacco use: Secondary | ICD-10-CM

## 2011-12-11 DIAGNOSIS — I4891 Unspecified atrial fibrillation: Secondary | ICD-10-CM | POA: Diagnosis not present

## 2011-12-11 DIAGNOSIS — R1110 Vomiting, unspecified: Secondary | ICD-10-CM | POA: Diagnosis not present

## 2011-12-11 DIAGNOSIS — J309 Allergic rhinitis, unspecified: Secondary | ICD-10-CM

## 2011-12-11 DIAGNOSIS — J984 Other disorders of lung: Secondary | ICD-10-CM | POA: Diagnosis present

## 2011-12-11 DIAGNOSIS — I2510 Atherosclerotic heart disease of native coronary artery without angina pectoris: Secondary | ICD-10-CM | POA: Diagnosis present

## 2011-12-11 DIAGNOSIS — K5790 Diverticulosis of intestine, part unspecified, without perforation or abscess without bleeding: Secondary | ICD-10-CM | POA: Diagnosis present

## 2011-12-11 DIAGNOSIS — K5730 Diverticulosis of large intestine without perforation or abscess without bleeding: Secondary | ICD-10-CM | POA: Diagnosis not present

## 2011-12-11 HISTORY — DX: Personal history of other diseases of the digestive system: Z87.19

## 2011-12-11 LAB — CBC WITH DIFFERENTIAL/PLATELET
Basophils Absolute: 0.1 10*3/uL (ref 0.0–0.1)
Basophils Relative: 1 % (ref 0–1)
Eosinophils Absolute: 0.2 10*3/uL (ref 0.0–0.7)
Eosinophils Relative: 2 % (ref 0–5)
HCT: 34.7 % — ABNORMAL LOW (ref 39.0–52.0)
Hemoglobin: 11.1 g/dL — ABNORMAL LOW (ref 13.0–17.0)
Lymphocytes Relative: 19 % (ref 12–46)
Lymphs Abs: 1.8 10*3/uL (ref 0.7–4.0)
MCH: 28.5 pg (ref 26.0–34.0)
MCHC: 32 g/dL (ref 30.0–36.0)
MCV: 89 fL (ref 78.0–100.0)
Monocytes Absolute: 0.7 10*3/uL (ref 0.1–1.0)
Monocytes Relative: 7 % (ref 3–12)
Neutro Abs: 7 10*3/uL (ref 1.7–7.7)
Neutrophils Relative %: 72 % (ref 43–77)
Platelets: 182 10*3/uL (ref 150–400)
RBC: 3.9 MIL/uL — ABNORMAL LOW (ref 4.22–5.81)
RDW: 14.4 % (ref 11.5–15.5)
WBC: 9.8 10*3/uL (ref 4.0–10.5)

## 2011-12-11 LAB — CBC
HCT: 31.6 % — ABNORMAL LOW (ref 39.0–52.0)
Hemoglobin: 10.1 g/dL — ABNORMAL LOW (ref 13.0–17.0)
MCHC: 32 g/dL (ref 30.0–36.0)
RBC: 3.59 MIL/uL — ABNORMAL LOW (ref 4.22–5.81)
WBC: 8.3 10*3/uL (ref 4.0–10.5)

## 2011-12-11 LAB — COMPREHENSIVE METABOLIC PANEL
ALT: 8 U/L (ref 0–53)
AST: 12 U/L (ref 0–37)
Albumin: 2.7 g/dL — ABNORMAL LOW (ref 3.5–5.2)
Alkaline Phosphatase: 71 U/L (ref 39–117)
BUN: 32 mg/dL — ABNORMAL HIGH (ref 6–23)
CO2: 24 mEq/L (ref 19–32)
Calcium: 8.1 mg/dL — ABNORMAL LOW (ref 8.4–10.5)
Chloride: 106 mEq/L (ref 96–112)
Creatinine, Ser: 0.94 mg/dL (ref 0.50–1.35)
GFR calc Af Amer: >90 mL/min (ref >90)
GFR calc non Af Amer: 85 mL/min — ABNORMAL LOW (ref >90)
Glucose, Bld: 77 mg/dL (ref 70–99)
Potassium: 4.7 mEq/L (ref 3.5–5.1)
Sodium: 139 mEq/L (ref 135–145)
Total Bilirubin: 0.4 mg/dL (ref 0.3–1.2)
Total Protein: 5.2 g/dL — ABNORMAL LOW (ref 6.0–8.3)

## 2011-12-11 LAB — OCCULT BLOOD, POC DEVICE: Fecal Occult Bld: POSITIVE

## 2011-12-11 MED ORDER — ONDANSETRON HCL 4 MG/2ML IJ SOLN: 4.0000 mg | Freq: Four times a day (QID) | INTRAMUSCULAR | Status: DC | PRN

## 2011-12-11 MED ORDER — PANTOPRAZOLE SODIUM 40 MG IV SOLR: 40.0000 mg | INTRAVENOUS | Status: DC

## 2011-12-11 MED ORDER — IOHEXOL 300 MG/ML  SOLN
80.0000 mL | Freq: Once | INTRAMUSCULAR | Status: AC | PRN
  Administered 2011-12-11: 80 mL via INTRAVENOUS

## 2011-12-11 MED ORDER — PANTOPRAZOLE SODIUM 40 MG IV SOLR
40.0000 mg | Freq: Once | INTRAVENOUS | Status: AC
  Administered 2011-12-11: 40 mg via INTRAVENOUS
  Filled 2011-12-11: qty 40

## 2011-12-11 MED ORDER — HYDROCODONE-ACETAMINOPHEN 5-325 MG PO TABS: 1.0000 | ORAL_TABLET | ORAL | Status: DC | PRN

## 2011-12-11 MED ORDER — ONDANSETRON HCL 4 MG PO TABS: 4.0000 mg | ORAL_TABLET | Freq: Four times a day (QID) | ORAL | Status: DC | PRN

## 2011-12-11 MED ORDER — SODIUM CHLORIDE 0.9 % IJ SOLN
3.0000 mL | Freq: Two times a day (BID) | INTRAMUSCULAR | Status: DC
  Administered 2011-12-12: 3 mL via INTRAVENOUS

## 2011-12-11 MED ORDER — DIAZEPAM 5 MG PO TABS: 5.0000 mg | ORAL_TABLET | Freq: Four times a day (QID) | ORAL | Status: DC | PRN

## 2011-12-11 MED ORDER — DIGOXIN 125 MCG PO TABS
0.1250 mg | ORAL_TABLET | Freq: Every day | ORAL | Status: DC
  Administered 2011-12-11 – 2011-12-13 (×3): 0.125 mg via ORAL
  Filled 2011-12-11 (×3): qty 1

## 2011-12-11 MED ORDER — SODIUM CHLORIDE 0.9 % IV SOLN
INTRAVENOUS | Status: DC
  Administered 2011-12-11: 20 mL/h via INTRAVENOUS

## 2011-12-11 MED ORDER — IOHEXOL 300 MG/ML  SOLN: 20.0000 mL | INTRAMUSCULAR | Status: AC

## 2011-12-11 MED ORDER — SODIUM CHLORIDE 0.9 % IV SOLN
8.0000 mg/h | INTRAVENOUS | Status: DC
  Filled 2011-12-11 (×2): qty 80

## 2011-12-11 MED ORDER — ALUM & MAG HYDROXIDE-SIMETH 200-200-20 MG/5ML PO SUSP: 30.0000 mL | Freq: Four times a day (QID) | ORAL | Status: DC | PRN

## 2011-12-11 NOTE — ED Notes (Signed)
Notified CT of pt completion of PO contrast.

## 2011-12-11 NOTE — Consult Note (Signed)
[facility] Consultation  Referring Provider: [facility] Primary Care Physician:  Hannah Beat, MD Primary Gastroenterologist:   Stan Head, MD Reason for Consultation:  GI Bleed  HPI: [name] is a 66 y.o. male with a history of atrial fibrillation and Class II heart failure. He has an ICD. Patient followed by Dr. Sharrell Ku and based on cardiology notes has declined anticoagulation. Patient known to Dr. Leone Payor for history of diverticular disease. He was last seen in 2010 for dysphagia and diarrhea. Colonoscopy recommended but patient didn't have it done. Patient gives a history of recurrent diverticulitis, last episode 3 weeks ago. He responded to antibiotics given by PCP. At 3 am today patient had urge to defecate but expelled only bright red blood. He had several more episodes followed by one episode of nausea and vomiting (non-bloody). It sounds like patient had a syncopal episode and family called EMS. Blood pressure at home was in 80's, he responded to IVF.   No FMH of colon cancer, liver or pancreatic disease. Father had cancer of trachea.    Past Medical History  Diagnosis Date  . Acute myocardial infarction, unspecified site, episode of care unspecified   . Paroxysmal ventricular tachycardia   . Atrial fibrillation   . ICD (implantable cardiac defibrillator) in place   . Congestive heart failure, unspecified     Reports EF of 26%.   [name] CAD (coronary artery disease), autologous vein bypass graft   . Chronic airway obstruction, not elsewhere classified   . Allergic rhinitis, cause unspecified   . HLD (hyperlipidemia)   . Noncompliance   . Diverticulosis     by CT scan    Past Surgical History  Procedure Date  . Cardiac defibrillator placement   . Coronary angioplasty with stent placement 1998  . Rotator cuff repair     right  . Cholecystectomy, laparoscopic 02/2010    Prior to Admission medications   Medication Sig Start Date End Date  Taking? Authorizing Provider  albuterol (PROVENTIL HFA;VENTOLIN HFA) 108 (90 BASE) MCG/ACT inhaler Inhale 2 puffs into the lungs every 6 (six) hours as needed. As needed for shortness of breath.   Yes Historical Provider, MD  aspirin 325 MG EC tablet Take 325 mg by mouth daily.     Yes Historical Provider, MD  carvedilol (COREG) 12.5 MG tablet Take 1 tablet (12.5 mg total) by mouth 2 (two) times daily with a meal. 06/06/11  Yes Dianne Dun, MD  digoxin (LANOXIN) 0.125 MG tablet Take 1 tablet (0.125 mg total) by mouth daily. 11/15/11  Yes Marinus Maw, MD  furosemide (LASIX) 20 MG tablet Take 1 tablet (20 mg total) by mouth as needed. 06/05/11  Yes Rosalio Macadamia, NP  HYDROcodone-acetaminophen (VICODIN) 5-500 MG per tablet Take 1 tablet by mouth every 6 (six) hours as needed. 04/19/11  Yes Spencer Copland, MD  loperamide (IMODIUM) 2 MG capsule Take 2-4 mg by mouth 4 (four) times daily as needed. As needed for loose stool. 2 tablets at onset and 1 tablet every hour as needed. Do not exceed more than 8 tablets in 24 hours.   Yes Historical Provider, MD  losartan (COZAAR) 50 MG tablet TAKE 1 TABLET (50 MG TOTAL) BY MOUTH DAILY. 10/15/11  Yes Rosalio Macadamia, NP  metaxalone (SKELAXIN) 800 MG tablet Take 1 tablet (800 mg total) by mouth as needed. 05/06/11  Yes Spencer Copland, MD  nitroGLYCERIN (NITROSTAT) 0.4 MG SL tablet Place 0.4 mg under the tongue every 5 (  five) minutes as needed. For cheat pain.   Yes Historical Provider, MD  promethazine (PHENERGAN) 25 MG tablet Take 25 mg by mouth every 6 (six) hours as needed. As needed for nausea/vomiting.   Yes Historical Provider, MD  traMADol (ULTRAM) 50 MG tablet Take 50 mg by mouth every 6 (six) hours as needed. For pain.   Yes Historical Provider, MD  benzonatate (TESSALON) 200 MG capsule TAKE 1 CAPSULE BY MOUTH 3 TIMES DAILY AS NEEDED FOR COUGH 02/24/11   Hannah Beat, MD  diazepam (VALIUM) 5 MG tablet Take 1 tablet (5 mg total) by mouth every 6 (six) hours  as needed. 11/04/11   Hannah Beat, MD  Pseudoephedrine HCl (SUDAFED 12 HOUR PO) Take 2 tablets by mouth 2 (two) times daily as needed. As needed for congestion.    Historical Provider, MD  zolpidem (AMBIEN CR) 12.5 MG CR tablet Take 12.5 mg by mouth at bedtime as needed. For insomnia. 08/15/11   Hannah Beat, MD    Current Facility-Administered Medications  Medication Dose Route Frequency Provider Last Rate Last Dose  . [EXPIRED] iohexol (OMNIPAQUE) 300 MG/ML solution 20 mL  20 mL Oral Q1 Hr x 2 Medication Radiologist, MD      . [COMPLETED] iohexol (OMNIPAQUE) 300 MG/ML solution 80 mL  80 mL Intravenous Once PRN Medication Radiologist, MD   80 mL at 12/11/11 1522  . [COMPLETED] pantoprazole (PROTONIX) injection 40 mg  40 mg Intravenous Once [employer], PA-C   40 mg at 12/11/11 1606  . pantoprazole (PROTONIX) injection 40 mg  40 mg Intravenous Q24H Sorin C Lavera Guise, MD      . [DISCONTINUED] pantoprazole (PROTONIX) 80 mg in sodium chloride 0.9 % 250 mL infusion  8 mg/hr Intravenous Continuous Carlyle Dolly, PA-C       Current Outpatient Prescriptions  Medication Sig Dispense Refill  . albuterol (PROVENTIL HFA;VENTOLIN HFA) 108 (90 BASE) MCG/ACT inhaler Inhale 2 puffs into the lungs every 6 (six) hours as needed. As needed for shortness of breath.      [name] aspirin 325 MG EC tablet Take 325 mg by mouth daily.        . carvedilol (COREG) 12.5 MG tablet Take 1 tablet (12.5 mg total) by mouth 2 (two) times daily with a meal.  180 tablet  1  . digoxin (LANOXIN) 0.125 MG tablet Take 1 tablet (0.125 mg total) by mouth daily.  30 tablet  1  . furosemide (LASIX) 20 MG tablet Take 1 tablet (20 mg total) by mouth as needed.  30 tablet  5  . HYDROcodone-acetaminophen (VICODIN) 5-500 MG per tablet Take 1 tablet by mouth every 6 (six) hours as needed.  50 tablet  3  . loperamide (IMODIUM) 2 MG capsule Take 2-4 mg by mouth 4 (four) times daily as needed. As needed for loose stool. 2 tablets at  onset and 1 tablet every hour as needed. Do not exceed more than 8 tablets in 24 hours.      [name] losartan (COZAAR) 50 MG tablet TAKE 1 TABLET (50 MG TOTAL) BY MOUTH DAILY.  30 tablet  5  . metaxalone (SKELAXIN) 800 MG tablet Take 1 tablet (800 mg total) by mouth as needed.  90 tablet  5  . nitroGLYCERIN (NITROSTAT) 0.4 MG SL tablet Place 0.4 mg under the tongue every 5 (five) minutes as needed. For cheat pain.      . promethazine (PHENERGAN) 25 MG tablet Take 25 mg by mouth every 6 (six) hours as  needed. As needed for nausea/vomiting.      . traMADol (ULTRAM) 50 MG tablet Take 50 mg by mouth every 6 (six) hours as needed. For pain.      . benzonatate (TESSALON) 200 MG capsule TAKE 1 CAPSULE BY MOUTH 3 TIMES DAILY AS NEEDED FOR COUGH  30 capsule  1  . diazepam (VALIUM) 5 MG tablet Take 1 tablet (5 mg total) by mouth every 6 (six) hours as needed.  40 tablet  5  . Pseudoephedrine HCl (SUDAFED 12 HOUR PO) Take 2 tablets by mouth 2 (two) times daily as needed. As needed for congestion.      [name] zolpidem (AMBIEN CR) 12.5 MG CR tablet Take 12.5 mg by mouth at bedtime as needed. For insomnia.      . [DISCONTINUED] zolpidem (AMBIEN CR) 12.5 MG CR tablet Take 1 tablet (12.5 mg total) by mouth at bedtime as needed.  30 tablet  5    Allergies as of 12/11/2011 - Review Complete 12/11/2011  Allergen Reaction Noted  . Codeine Other (See Comments)   . Penicillins Swelling   . Ace inhibitors Other (See Comments) 05/02/2010    Family History  Problem Relation Age of Onset  . Diabetes Father   . Stroke Mother   . Cancer Father     ?    History   Social History  . Marital Status: Married    Spouse Name: N/A    Number of Children: 0  . Years of Education: N/A   Occupational History  . 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 Smoking status: Current Every Day Smoker -- 4.0 packs/day for 150 years    Types: Cigarettes  . Smokeless tobacco: Not on file     Comment: NOW DOWN TO 4  CIGS A DAY  . Alcohol Use: No  . Drug Use: No  . Sexually Active: Not on file     Review of Systems: All systems reviewed and negative except where noted in HPI  PHYSICAL EXAM: Vital signs in last 24 hours: Temp:  [97.4 F (36.3 C)] 97.4 F (36.3 C) (11/06 1043) Pulse Rate:  [54-95] 95  (11/06 1547) Resp:  [19-21] 20  (11/06 1547) BP: (95-128)/(62-90) 128/78 mmHg (11/06 1547) SpO2:  [94 %-100 %] 98 % (11/06 1547)   General:   Pleasant small framed white male in NAD Head:  Normocephalic and atraumatic. Eyes:   No icterus.   Conjunctiva pink. Ears:  Normal auditory acuity. Neck:  Supple; no masses felt Lungs:  Respirations even and unlabored. Lungs clear to auscultation bilaterally.   No wheezes, crackles, or rhonchi.  Heart:  Regular rate and rhythm Abdomen:  Soft, nondistended. There is mild LLQ tenderness on exam. Normal bowel sounds. No appreciable masses or hepatomegaly.  Rectal:  Small amount of bright red blood in vault.   Msk:  Symmetrical without gross deformities.  Extremities:  Without edema. Neurologic:  Alert and  oriented x4;  grossly normal neurologically. Skin:  Intact without significant lesions or rashes. Cervical Nodes:  No significant cervical adenopathy. Psych:  Alert and cooperative. Normal affect.  LAB RESULTS:  Basename 12/11/11 1108  WBC 9.8  HGB 11.1*  HCT 34.7*  PLT 182   BMET  Basename 12/11/11 1108  NA 139  K 4.7  CL 106  CO2 24  GLUCOSE 77  BUN 32*  CREATININE 0.94  CALCIUM 8.1*   LFT  Basename 12/11/11 1108  PROT 5.2*  ALBUMIN 2.7*  AST 12  ALT 8  ALKPHOS 71  BILITOT 0.4  BILIDIR --  IBILI --    STUDIES: Ct Abdomen Pelvis W Contrast  12/11/2011  *RADIOLOGY REPORT*  Clinical Data: Rectal bleeding today  CT ABDOMEN AND PELVIS WITH CONTRAST  Technique:  Multidetector CT imaging of the abdomen and pelvis was performed following the standard protocol during bolus administration of intravenous contrast.  Contrast: 80mL  OMNIPAQUE IOHEXOL 300 MG/ML  SOLN  Comparison: CT urogram of 06/19/2010  Findings: Linear scarring is noted at both lung bases posteriorly. Moderate cardiomegaly is present with pacer leads noted.  The liver enhances with no focal abnormality and no ductal dilatation is seen.  Surgical clips are present from prior cholecystectomy.  The pancreas is normal in size and the pancreatic duct is not dilated. The adrenal glands and spleen are unremarkable.  The stomach is decompressed and cannot be evaluated.  Low attenuation bilateral renal lesions are noted most consistent with cysts.  Delayed images show normal pelvocaliceal systems bilaterally.  The proximal ureters are normal in caliber.  Moderate atheromatous change is noted within the abdominal aorta.  The mesenteric vasculature appears patent.  There are multiple colonic diverticula scattered diffusely, but concentrated primarily in the rectosigmoid colon where there is significant diverticulosis present.  No definite colonic mass is seen and no diverticulitis is evident.  The only questionable abnormality is fullness in the base of the cecum posteriorly.  This may represent retained fecal material, but a soft tissue mass cannot be excluded at that site.  The appendix is well visualized and fills with air normally.  The terminal ileum is well opacified with oral contrast and is unremarkable.  The urinary bladder is moderately urine distended with no abnormality noted and the prostate is only slightly prominent.  No bony abnormality is seen.  IMPRESSION:  1.  There are diffuse colonic diverticula, concentrated primarily within the rectosigmoid colon where there is significant diverticulosis present. 2.  Probable fecal material in the base of the cecum but a soft tissue mass at that site cannot be excluded. 3.  The appendix and terminal ileum appear normal.   Original Report Authenticated By: Dwyane Dee, M.D.      PREVIOUS ENDOSCOPIES: none  IMPRESSION /  PLAN: 1. GI bleed, suspect lower GI bleed. Some sharp abdominal pain but doubt ischemic colitis. CT scan negative for ischemic changes. Suspect diverticular hemorrhage. Keep on sips of clears for now. Patient should have upper and lower endoscopy at some point but he is hesitant to proceed. Will follow along.   2. Anemia of acute blood loss. Hemoglobin down from 14.5 on 11/15/11 to 11.1 now. No bleeding since arriving to ED several hours ago. Monitor H&H, he may require a transfusion.   3. Diverticular disease by CTscan. History of recurrent diverticulitis, last episode a few weeks ago. He has mild LLQ tenderness on exam but no diverticulitis by CTscan.   4.  History of heart failure, s/p ICD. History of atrial fibrillation. Followed by [facility].   5. Abnormal CTscan, cecal mass vrs fecal material. Doubt any relationship with #1 but needs colonoscopy at some point. Patient is not interested at this point.    Thanks   LOS: 0 days   Willette Cluster  12/11/2011, 5:37 PM   I have taken a history, examined the patient and reviewed the chart. I agree with Penny Pia note, impression and recommendations. Acute GI bleed with hematochezia. Hypotension noted by EMS which has now corrected. Hb=11. Recently  treated for diverticulitis and CT scan shows diffused colonic diverticulosis, more concentrated in the rectosigmoid colon and stool vs other abnormality in the cecum. Probable LGI bleed, cannot rule out rapid UGI bleed. IVF, monitor Hb, IV PPI, clears only. Recommend colonoscopy and possible EGD. Pt hesitant to agree to endoscopic evaluation.   Meryl Dare MD [facility]

## 2011-12-11 NOTE — ED Provider Notes (Signed)
History     CSN: [id number]  Arrival date & time 12/11/11  1028   First MD Initiated Contact with Patient 12/11/11 1041      Chief Complaint  Patient presents with  . Rectal Bleeding    (Consider location/radiation/quality/duration/timing/severity/associated sxs/prior treatment) HPI Pt is a 66 yo male with CHF and diverticulosis who presents with bloody diarrhea.  Diarrhea began this morning at 3 am.  Pt states that there was a large amount of bright red blood with no stool.  He has had bloody BMs about every 15 min.  He took imodium several times.  The last time he took it seemed to help a little.  He was prescribed Cipro and Flagyl 3 weeks ago for diverticulitis.  He has never had a colonoscopy.  He threw up once this morning.  Pt denies fever, abdominal pain, nausea, dysuria, pelvic pain, chest pain, and SOB.  Past Medical History  Diagnosis Date  . Acute myocardial infarction, unspecified site, episode of care unspecified   . Paroxysmal ventricular tachycardia   . Atrial fibrillation   . ICD (implantable cardiac defibrillator) in place   . Congestive heart failure, unspecified     Reports EF of 26%.   [name] CAD (coronary artery disease), autologous vein bypass graft   . Chronic airway obstruction, not elsewhere classified   . Allergic rhinitis, cause unspecified   . HLD (hyperlipidemia)   . Noncompliance   . Diverticulosis     by CT scan    Past Surgical History  Procedure Date  . Cardiac defibrillator placement   . Coronary angioplasty with stent placement 1998  . Rotator cuff repair     right  . Cholecystectomy, laparoscopic 02/2010    Family History  Problem Relation Age of Onset  . Diabetes Father   . Stroke Mother   . Cancer Father     ?    History  Substance Use Topics  . Smoking status: Current Every Day Smoker -- 4.0 packs/day for 150 years    Types: Cigarettes  . Smokeless tobacco: Not on file     Comment: NOW DOWN TO 4 CIGS A DAY  . Alcohol Use: No        Review of Systems All other systems negative as documented in the HPI. All pertinent positives and negatives as reviewed in the HPI.  Allergies  Codeine; Penicillins; and Ace inhibitors  Home Medications   Current Outpatient Rx  Name  Route  Sig  Dispense  Refill  . ASPIRIN 325 MG PO TBEC   Oral   Take 325 mg by mouth daily.           [name] BENZONATATE 200 MG PO CAPS      TAKE 1 CAPSULE BY MOUTH 3 TIMES DAILY AS NEEDED FOR COUGH   30 capsule   1   . CARVEDILOL 12.5 MG PO TABS   Oral   Take 1 tablet (12.5 mg total) by mouth 2 (two) times daily with a meal.   180 tablet   1   . CIPROFLOXACIN HCL 500 MG PO TABS   Oral   Take 1 tablet (500 mg total) by mouth 2 (two) times daily.   20 tablet   0   . DIAZEPAM 5 MG PO TABS   Oral   Take 1 tablet (5 mg total) by mouth every 6 (six) hours as needed.   40 tablet   5   . DIGOXIN 0.125 MG PO TABS   Oral  Take 1 tablet (0.125 mg total) by mouth daily.   30 tablet   1     Pt needs appointment then refill can be made   . FUROSEMIDE 20 MG PO TABS   Oral   Take 1 tablet (20 mg total) by mouth as needed.   30 tablet   5   . HYDROCODONE-ACETAMINOPHEN 5-500 MG PO TABS   Oral   Take 1 tablet by mouth every 6 (six) hours as needed.   50 tablet   3   . LOSARTAN POTASSIUM 50 MG PO TABS      TAKE 1 TABLET (50 MG TOTAL) BY MOUTH DAILY.   30 tablet   5   . METAXALONE 800 MG PO TABS   Oral   Take 1 tablet (800 mg total) by mouth as needed.   90 tablet   5   . METRONIDAZOLE 500 MG PO TABS   Oral   Take 1 tablet (500 mg total) by mouth 3 (three) times daily.   30 tablet   0   . NITROSTAT 0.4 MG SL SUBL      1 BY MOUTH AS NEEDED AS DIRECTED   25 tablet   11   . PROAIR HFA 108 (90 BASE) MCG/ACT IN AERS      USE 2 PUFFS EVERY 4 HOURS AS NEEDED FOR SHORTNESS OF BREATH   8.5 g   1   . PROMETHAZINE HCL 25 MG PO TABS      TAKE 1 TABLET BY MOUTH EVERY 8 HOURS AS NEEDED FOR NAUSEA   40 tablet   2   .  SUDAFED 12 HOUR PO   Oral   Take 2 tablets by mouth as needed.           [name] TRAMADOL HCL 50 MG PO TABS      TAKE 1 TAB FOUR TIMES A DAY AS NEEDED FOR PAIN   50 tablet   5   . ZOLPIDEM TARTRATE ER 12.5 MG PO TBCR   Oral   Take 1 tablet (12.5 mg total) by mouth at bedtime as needed.   30 tablet   5     BP 95/62  Pulse 77  Temp 97.4 F (36.3 C) (Oral)  Resp 19  SpO2 100%  Physical Exam  Constitutional: He is oriented to person, place, and time. He appears well-developed and well-nourished. No distress.  HENT:  Head: Normocephalic and atraumatic.  Mouth/Throat: Oropharynx is clear and moist.  Cardiovascular: Normal rate, regular rhythm and normal heart sounds.   Pulmonary/Chest: Effort normal.  Abdominal: Soft. He exhibits no distension. Bowel sounds are increased. There is no tenderness. There is no rebound and no guarding.  Neurological: He is alert and oriented to person, place, and time.  Skin: Skin is warm and dry. No rash noted.    ED Course  Procedures (including critical care time)   Labs Reviewed  CBC WITH DIFFERENTIAL  COMPREHENSIVE METABOLIC PANEL    The patient will be admitted for GI bleeding. The patient has been stable here in the ER. The patient is advised of the plan.   MDM  MDM Reviewed: nursing note and vitals Reviewed previous: labs Interpretation: labs and CT scan Consults: admitting MD            Carlyle Dolly, PA-C 12/11/11 1627

## 2011-12-11 NOTE — H&P (Signed)
[facility] History and Physical  [name]:[id number] DOB: 1945-11-09 DOA: 12/11/2011  Referring physician: EDP PCP: Hannah Beat, MD  Specialists: [facility]  Chief Complaint:  Chief Complaint  Patient presents with  . Rectal Bleeding     HPI: [name] is a 66 y.o. male with history of atrial fibrillation who presents the Ed via EMS because of repeat bloody BMs.  Patient is  known to Dr. Leone Payor for history of diverticular disease. He was last seen in 2010 for dysphagia and diarrhea. Colonoscopy recommended but patient didn't have it done. He also vomited once brown not coffee ground emesis.    Review of Systems: The patient denies anorexia, fever, weight loss,, vision loss, decreased hearing, hoarseness, chest pain, syncope, dyspnea on exertion, peripheral edema, balance deficits, hemoptysis, abdominal pain, melena, hematochezia, severe indigestion/heartburn, hematuria, incontinence, genital sores, muscle weakness, suspicious skin lesions, transient blindness, difficulty walking, depression, unusual weight change, abnormal bleeding, enlarged lymph nodes, angioedema,  Past Medical History  Diagnosis Date  . Acute myocardial infarction, unspecified site, episode of care unspecified   . Paroxysmal ventricular tachycardia   . Atrial fibrillation   . ICD (implantable cardiac defibrillator) in place   . Congestive heart failure, unspecified     Reports EF of 26%.   [name] CAD (coronary artery disease), autologous vein bypass graft   . Chronic airway obstruction, not elsewhere classified   . Allergic rhinitis, cause unspecified   . HLD (hyperlipidemia)   . Noncompliance   . Diverticulosis     by CT scan   Past Surgical History  Procedure Date  . Cardiac defibrillator placement   . Coronary angioplasty with stent placement 1998  . Rotator cuff repair     right  . Cholecystectomy, laparoscopic 02/2010   Social History:  reports that he has been smoking  Cigarettes.  He has a 600 pack-year smoking history. He does not have any smokeless tobacco history on file. He reports that he does not drink alcohol or use illicit drugs. Patient lives at home with his wife   Allergies  Allergen Reactions  . Codeine Other (See Comments)    "head wants to explode."  . Penicillins Swelling  . Ace Inhibitors Other (See Comments)    Cough, muscle pain.     Family History  Problem Relation Age of Onset  . Diabetes Father   . Stroke Mother   . Cancer Father     ?     Prior to Admission medications   Medication Sig Start Date End Date Taking? Authorizing Provider  albuterol (PROVENTIL HFA;VENTOLIN HFA) 108 (90 BASE) MCG/ACT inhaler Inhale 2 puffs into the lungs every 6 (six) hours as needed. As needed for shortness of breath.   Yes Historical Provider, MD  aspirin 325 MG EC tablet Take 325 mg by mouth daily.     Yes Historical Provider, MD  carvedilol (COREG) 12.5 MG tablet Take 1 tablet (12.5 mg total) by mouth 2 (two) times daily with a meal. 06/06/11  Yes Dianne Dun, MD  digoxin (LANOXIN) 0.125 MG tablet Take 1 tablet (0.125 mg total) by mouth daily. 11/15/11  Yes Marinus Maw, MD  furosemide (LASIX) 20 MG tablet Take 1 tablet (20 mg total) by mouth as needed. 06/05/11  Yes Rosalio Macadamia, NP  HYDROcodone-acetaminophen (VICODIN) 5-500 MG per tablet Take 1 tablet by mouth every 6 (six) hours as needed. 04/19/11  Yes Spencer Copland, MD  loperamide (IMODIUM) 2 MG capsule Take 2-4 mg  by mouth 4 (four) times daily as needed. As needed for loose stool. 2 tablets at onset and 1 tablet every hour as needed. Do not exceed more than 8 tablets in 24 hours.   Yes Historical Provider, MD  losartan (COZAAR) 50 MG tablet TAKE 1 TABLET (50 MG TOTAL) BY MOUTH DAILY. 10/15/11  Yes Rosalio Macadamia, NP  metaxalone (SKELAXIN) 800 MG tablet Take 1 tablet (800 mg total) by mouth as needed. 05/06/11  Yes Spencer Copland, MD  nitroGLYCERIN (NITROSTAT) 0.4 MG SL tablet Place 0.4 mg  under the tongue every 5 (five) minutes as needed. For cheat pain.   Yes Historical Provider, MD  promethazine (PHENERGAN) 25 MG tablet Take 25 mg by mouth every 6 (six) hours as needed. As needed for nausea/vomiting.   Yes Historical Provider, MD  traMADol (ULTRAM) 50 MG tablet Take 50 mg by mouth every 6 (six) hours as needed. For pain.   Yes Historical Provider, MD  benzonatate (TESSALON) 200 MG capsule TAKE 1 CAPSULE BY MOUTH 3 TIMES DAILY AS NEEDED FOR COUGH 02/24/11   Hannah Beat, MD  diazepam (VALIUM) 5 MG tablet Take 1 tablet (5 mg total) by mouth every 6 (six) hours as needed. 11/04/11   Hannah Beat, MD  Pseudoephedrine HCl (SUDAFED 12 HOUR PO) Take 2 tablets by mouth 2 (two) times daily as needed. As needed for congestion.    Historical Provider, MD  zolpidem (AMBIEN CR) 12.5 MG CR tablet Take 12.5 mg by mouth at bedtime as needed. For insomnia. 08/15/11   Hannah Beat, MD   Physical Exam: Filed Vitals:   12/11/11 1043 12/11/11 1100 12/11/11 1200 12/11/11 1547  BP: 95/62 109/77 113/90 128/78  Pulse: 77 54 58 95  Temp: 97.4 F (36.3 C)     TempSrc: Oral     Resp: 19 21 21 20   SpO2: 100% 100% 94% 98%     General:  axox3  Eyes: perrla, eomi   ENT: clear pharynx  Neck: no JVD, no thyromegaly  Cardiovascular: irreg irreg, no murmur   Respiratory: CTAB, no W,R,C  Abdomen: soft, NT, BS present   Skin: warm, dry   Musculoskeletal: intact, patient does have changes of DIP joint swelling due to OA  Psychiatric: euthymic, mood congruent   Neurologic: CN 2-12 intact, strength 5/5 all 4, sensation intact   Labs on Admission:  Basic Metabolic Panel:  Lab 12/11/11 1610  NA 139  K 4.7  CL 106  CO2 24  GLUCOSE 77  BUN 32*  CREATININE 0.94  CALCIUM 8.1*  MG --  PHOS --   Liver Function Tests:  Lab 12/11/11 1108  AST 12  ALT 8  ALKPHOS 71  BILITOT 0.4  PROT 5.2*  ALBUMIN 2.7*   No results found for this basename: LIPASE:5,AMYLASE:5 in the last 168  hours No results found for this basename: AMMONIA:5 in the last 168 hours CBC:  Lab 12/11/11 1108  WBC 9.8  NEUTROABS 7.0  HGB 11.1*  HCT 34.7*  MCV 89.0  PLT 182   Cardiac Enzymes: No results found for this basename: CKTOTAL:5,CKMB:5,CKMBINDEX:5,TROPONINI:5 in the last 168 hours  BNP (last 3 results)  Basename 02/12/11 1614  PROBNP 291.0*   CBG: No results found for this basename: GLUCAP:5 in the last 168 hours  Radiological Exams on Admission: Ct Abdomen Pelvis W Contrast  12/11/2011  *RADIOLOGY REPORT*  Clinical Data: Rectal bleeding today  CT ABDOMEN AND PELVIS WITH CONTRAST  Technique:  Multidetector CT imaging of the abdomen  and pelvis was performed following the standard protocol during bolus administration of intravenous contrast.  Contrast: 80mL OMNIPAQUE IOHEXOL 300 MG/ML  SOLN  Comparison: CT urogram of 06/19/2010  Findings: Linear scarring is noted at both lung bases posteriorly. Moderate cardiomegaly is present with pacer leads noted.  The liver enhances with no focal abnormality and no ductal dilatation is seen.  Surgical clips are present from prior cholecystectomy.  The pancreas is normal in size and the pancreatic duct is not dilated. The adrenal glands and spleen are unremarkable.  The stomach is decompressed and cannot be evaluated.  Low attenuation bilateral renal lesions are noted most consistent with cysts.  Delayed images show normal pelvocaliceal systems bilaterally.  The proximal ureters are normal in caliber.  Moderate atheromatous change is noted within the abdominal aorta.  The mesenteric vasculature appears patent.  There are multiple colonic diverticula scattered diffusely, but concentrated primarily in the rectosigmoid colon where there is significant diverticulosis present.  No definite colonic mass is seen and no diverticulitis is evident.  The only questionable abnormality is fullness in the base of the cecum posteriorly.  This may represent retained fecal  material, but a soft tissue mass cannot be excluded at that site.  The appendix is well visualized and fills with air normally.  The terminal ileum is well opacified with oral contrast and is unremarkable.  The urinary bladder is moderately urine distended with no abnormality noted and the prostate is only slightly prominent.  No bony abnormality is seen.  IMPRESSION:  1.  There are diffuse colonic diverticula, concentrated primarily within the rectosigmoid colon where there is significant diverticulosis present. 2.  Probable fecal material in the base of the cecum but a soft tissue mass at that site cannot be excluded. 3.  The appendix and terminal ileum appear normal.   Original Report Authenticated By: Dwyane Dee, M.D.       Assessment/Plan Principal Problem:  *GI bleed Active Problems:  HYPERLIPIDEMIA  CAD, UNSPECIFIED SITE  ATRIAL FIBRILLATION WITH RAPID VENTRICULAR RESPONSE  COPD, MILD  Acute on chronic systolic heart failure  Diverticulosis   1. GI bleed - most likely lower GI source - e.g diverticular. Tere is also a possibility he may have duodenal ulcer - (takes nsaids and has an elevated BUN). Would start PPI iv. F/u CBCs - also would consult GI 2. HTN - hold ARB as long as actively bleeding 3. AFIB - rate control with digoxin. Hold aspirin for now due to active bleeding   [facility] is on bard  Code Status: full code  Family Communication: wife at bedside  Disposition Plan: 2-3 days   Time spent: 45 minutes  [name] [facility] Pager [phone]  If 7PM-7AM, please contact night-coverage [email or web address] Password [email or web address] 12/11/2011, 5:29 PM

## 2011-12-11 NOTE — Progress Notes (Addendum)
Received pt from ED,alert and oriented X 3,to person place and time,PEARRLA +4+4,speech is clear able to follow,command,palpable radial and pedal pulses X 4,no edema,capillary refill less than 3 secs,99% Sao2 at room air,Lungs clear,Bowel sound X 4,in all quadranrt,abdomen soft non distended,no c/o pain,no skin issue,pleasnt and cooperative,wife at bedsd.

## 2011-12-11 NOTE — ED Notes (Signed)
Attempted to call report to receiving RN but she is involved in pt care in another room.  Request a few minutes to complete task.

## 2011-12-11 NOTE — Telephone Encounter (Signed)
I agree -- high risk, better eval in ER in this case

## 2011-12-11 NOTE — ED Notes (Signed)
No distress noted.

## 2011-12-11 NOTE — Telephone Encounter (Signed)
Caller: Jaeson/Patient; Patient Name: [name]; PCP: Hannah Beat [facility]); Best Callback Phone Number: [phone]; Reason for call: Other. Onset 12/11/11 Patient states she has been having bloody diarrhea since 4 AM today, and   vomiting dark brown coffe ground material.  Afebrile. Emergent symptoms Vomiting red, bloody or coffee-ground material, more than streaks of blood or scant amount positive per Gastrointestinal Bleeding protocol.  Advised patient to Activiate EMS 911.  Patient verbalized understanding.

## 2011-12-11 NOTE — ED Notes (Signed)
No distress noted.  Resp symmetrical and unlabored.   

## 2011-12-11 NOTE — ED Notes (Signed)
Per pt and [facility] pt was at home this am when he awoke and went to the bathroom.  While in the bathroom pt had a large bright red loose stool.  Since that episode this am he states that he has had 1 episode of emesis and 15 episodes of bloody diarrhea.  He was noted to be hypotensive on EMS arrival bp was in the 80's systolic.  Pt is A/o x 3 and is not noted to be in distress.  Resp symmetrical and unlabored.

## 2011-12-11 NOTE — ED Notes (Signed)
Pt states that he is comfortable at the present no distress noted.

## 2011-12-12 DIAGNOSIS — K922 Gastrointestinal hemorrhage, unspecified: Principal | ICD-10-CM

## 2011-12-12 DIAGNOSIS — I2510 Atherosclerotic heart disease of native coronary artery without angina pectoris: Secondary | ICD-10-CM | POA: Diagnosis not present

## 2011-12-12 DIAGNOSIS — I4891 Unspecified atrial fibrillation: Secondary | ICD-10-CM | POA: Diagnosis not present

## 2011-12-12 DIAGNOSIS — K5730 Diverticulosis of large intestine without perforation or abscess without bleeding: Secondary | ICD-10-CM | POA: Diagnosis not present

## 2011-12-12 LAB — CBC
HCT: 32.2 % — ABNORMAL LOW (ref 39.0–52.0)
Hemoglobin: 10.2 g/dL — ABNORMAL LOW (ref 13.0–17.0)
MCH: 27.8 pg (ref 26.0–34.0)
MCH: 28.8 pg (ref 26.0–34.0)
MCHC: 31.7 g/dL (ref 30.0–36.0)
MCHC: 32.6 g/dL (ref 30.0–36.0)
MCV: 88.4 fL (ref 78.0–100.0)
Platelets: 193 10*3/uL (ref 150–400)
RDW: 14.4 % (ref 11.5–15.5)

## 2011-12-12 LAB — BASIC METABOLIC PANEL
BUN: 22 mg/dL (ref 6–23)
Chloride: 103 mEq/L (ref 96–112)
GFR calc non Af Amer: 83 mL/min — ABNORMAL LOW (ref >90)
Glucose, Bld: 86 mg/dL (ref 70–99)
Potassium: 4.2 mEq/L (ref 3.5–5.1)

## 2011-12-12 MED ORDER — CARVEDILOL 12.5 MG PO TABS
12.5000 mg | ORAL_TABLET | Freq: Two times a day (BID) | ORAL | Status: DC
  Administered 2011-12-12 – 2011-12-13 (×2): 12.5 mg via ORAL
  Filled 2011-12-12 (×4): qty 1

## 2011-12-12 MED ORDER — FLUTICASONE PROPIONATE 50 MCG/ACT NA SUSP
1.0000 | Freq: Every day | NASAL | Status: DC
  Administered 2011-12-13: 1 via NASAL
  Filled 2011-12-12: qty 16

## 2011-12-12 NOTE — Progress Notes (Signed)
In formed by NSMT pt. HR was up to 145.  Pt. Up in the room using BSC.  VSS - Blood pressure 98/65, pulse 81, temperature 98 F (36.7 C), temperature source Oral, resp. rate 19, height 5\' 3"  (1.6 m), weight 63.549 kg (140 lb 1.6 oz), SpO2 97.00%., R/A.  Informed Marianne, PA, no new orders given.  Will continue to monitor.  Forbes Cellar, RN

## 2011-12-12 NOTE — Progress Notes (Signed)
[facility] PROGRESS NOTE  [name] RUE:[id number] DOB: 05-25-1945 DOA: 12/11/2011 PCP: Hannah Beat, MD  Assessment/Plan  GI Bleed.  Lower.  Presumed diverticular Red blood clot this am when passing gas Hgb stable today Appreciate [facility] Consultation. Progress to full liquids  HTN Currently normotensive Holding BP meds  Afib.   Hod Aspirin On Digoxin Stable  Cecal Mass Unclear if this is stool versus mass Patient aware.  Discussed colonoscopy.  He has refused it in the past.  History of heart failure, s/p ICD.  No signs of active heart failure issues. Followed by [facility].  Code Status: Full Family Communication:  Disposition Plan: Inpatient   Consultants:  Brooke Dare  Procedures:    Antibiotics:  none  HPI/Subjective: No history of bleeding.  Does have h/o recurrent diverticulitis.  Currently with mild lower abd pain  Objective: Filed Vitals:   12/11/11 2100 12/12/11 0524 12/12/11 0840 12/12/11 1030  BP: 101/63 114/79 100/79 98/65  Pulse: 84 71 85 81  Temp: 97.7 F (36.5 C) 97.3 F (36.3 C) 98 F (36.7 C)   TempSrc: Oral Oral Oral   Resp: 18 18 19    Height:      Weight:      SpO2: 98% 99% 97%     Intake/Output Summary (Last 24 hours) at 12/12/11 1214 Last data filed at 12/12/11 0500  Gross per 24 hour  Intake 174.33 ml  Output      0 ml  Net 174.33 ml   Filed Weights   12/11/11 1817  Weight: 63.549 kg (140 lb 1.6 oz)    Exam:   General:  A&O  Cardiovascular: afib on tele, will spike into 140s when out of bed.  no M/R/G on my auscultation.  Respiratory: CTA, no W/C/R  Abdomen: Thin, soft, Active loud bowel sounds. Mildly TTP in lower quadrants bilaterally.  Extremities:  No C/C/E  Data Reviewed: Basic Metabolic Panel:  Lab 12/12/11 1478 12/11/11 1108  NA 138 139  K 4.2 4.7  CL 103 106  CO2 26 24  GLUCOSE 86 77  BUN 22 32*  CREATININE 0.99 0.94  CALCIUM 8.5 8.1*  MG -- --  PHOS -- --    Liver Function Tests:  Lab 12/11/11 1108  AST 12  ALT 8  ALKPHOS 71  BILITOT 0.4  PROT 5.2*  ALBUMIN 2.7*   CBC:  Lab 12/12/11 0541 12/11/11 1917 12/11/11 1108  WBC 8.0 8.3 9.8  NEUTROABS -- -- 7.0  HGB 10.2* 10.1* 11.1*  HCT 32.2* 31.6* 34.7*  MCV 87.7 88.0 89.0  PLT 197 188 182   BNP (last 3 results)  Basename 02/12/11 1614  PROBNP 291.0*   CBG:   Studies: Ct Abdomen Pelvis W Contrast  12/11/2011  *RADIOLOGY REPORT*  Clinical Data: Rectal bleeding today  CT ABDOMEN AND PELVIS WITH CONTRAST  Technique:  Multidetector CT imaging of the abdomen and pelvis was performed following the standard protocol during bolus administration of intravenous contrast.  Contrast: 80mL OMNIPAQUE IOHEXOL 300 MG/ML  SOLN  Comparison: CT urogram of 06/19/2010  Findings: Linear scarring is noted at both lung bases posteriorly. Moderate cardiomegaly is present with pacer leads noted.  The liver enhances with no focal abnormality and no ductal dilatation is seen.  Surgical clips are present from prior cholecystectomy.  The pancreas is normal in size and the pancreatic duct is not dilated. The adrenal glands and spleen are unremarkable.  The stomach is decompressed and cannot be evaluated.  Low attenuation  bilateral renal lesions are noted most consistent with cysts.  Delayed images show normal pelvocaliceal systems bilaterally.  The proximal ureters are normal in caliber.  Moderate atheromatous change is noted within the abdominal aorta.  The mesenteric vasculature appears patent.  There are multiple colonic diverticula scattered diffusely, but concentrated primarily in the rectosigmoid colon where there is significant diverticulosis present.  No definite colonic mass is seen and no diverticulitis is evident.  The only questionable abnormality is fullness in the base of the cecum posteriorly.  This may represent retained fecal material, but a soft tissue mass cannot be excluded at that site.  The appendix  is well visualized and fills with air normally.  The terminal ileum is well opacified with oral contrast and is unremarkable.  The urinary bladder is moderately urine distended with no abnormality noted and the prostate is only slightly prominent.  No bony abnormality is seen.  IMPRESSION:  1.  There are diffuse colonic diverticula, concentrated primarily within the rectosigmoid colon where there is significant diverticulosis present. 2.  Probable fecal material in the base of the cecum but a soft tissue mass at that site cannot be excluded. 3.  The appendix and terminal ileum appear normal.   Original Report Authenticated By: Dwyane Dee, M.D.     Scheduled Meds:   . digoxin  0.125 mg Oral Daily  . fluticasone  1 spray Each Nare Daily  . [EXPIRED] iohexol  20 mL Oral Q1 Hr x 2  . [COMPLETED] pantoprazole (PROTONIX) IV  40 mg Intravenous Once  . sodium chloride  3 mL Intravenous Q12H  . [DISCONTINUED] pantoprazole (PROTONIX) IV  40 mg Intravenous Q24H   Continuous Infusions:   . sodium chloride 20 mL/hr (12/11/11 2017)  . [DISCONTINUED] pantoprozole (PROTONIX) infusion      Principal Problem:  *GI bleed Active Problems:  HYPERLIPIDEMIA  CAD, UNSPECIFIED SITE  ATRIAL FIBRILLATION WITH RAPID VENTRICULAR RESPONSE  COPD, MILD  Acute on chronic systolic heart failure  Diverticulosis  Acute posthemorrhagic anemia    Time spent: 30 min    Stephani Police  [facility] Pager [phone]  If 8PM-8AM, please contact night-coverage at [email or web address], password [facility] 12/12/2011, 12:14 PM  LOS: 1 day     Attending Patient seen and examined, agree with the above assessment and plan. Likely Diverticular bleed-seems to have slowed down. H/H relatively stable, hemodynamically stable. Continue with supportive care.  S Ghimire

## 2011-12-12 NOTE — Progress Notes (Signed)
I have personally taken an interval history, reviewed the chart, and examined the patient.  I agree with the extender's note, impression and recommendations.  Robert D. Kaplan, MD, FACG [facility] [phone]  

## 2011-12-12 NOTE — Progress Notes (Signed)
[name] Progress Note  SUBJECTIVE: Had a BM around noon with small amount of maroon blood. Otherwise, doing fine.  OBJECTIVE:  Vital signs in last 24 hours: Temp:  [97.3 F (36.3 C)-98 F (36.7 C)] 98 F (36.7 C) (11/07 0840) Pulse Rate:  [41-101] 85  (11/07 0840) Resp:  [18-23] 19  (11/07 0840) BP: (95-128)/(62-90) 100/79 mmHg (11/07 0840) SpO2:  [94 %-100 %] 97 % (11/07 0840) Weight:  [140 lb 1.6 oz (63.549 kg)] 140 lb 1.6 oz (63.549 kg) (11/06 1817) Last BM Date: 12/11/11 General:    Pleasant white male in NAD Abdomen:  Soft, nondistended. Mild mid lower abdominal tenderness. Normal bowel sounds. Neurologic:  Alert and oriented,  grossly normal neurologically. Psych:  Cooperative. Normal mood and affect.   Lab Results:  Basename 12/12/11 0541 12/11/11 1917 12/11/11 1108  WBC 8.0 8.3 9.8  HGB 10.2* 10.1* 11.1*  HCT 32.2* 31.6* 34.7*  PLT 197 188 182   BMET  Basename 12/12/11 0541 12/11/11 1108  NA 138 139  K 4.2 4.7  CL 103 106  CO2 26 24  GLUCOSE 86 77  BUN 22 32*  CREATININE 0.99 0.94  CALCIUM 8.5 8.1*   LFT  Basename 12/11/11 1108  PROT 5.2*  ALBUMIN 2.7*  AST 12  ALT 8  ALKPHOS 71  BILITOT 0.4  BILIDIR --  IBILI --   Studies/Results: Ct Abdomen Pelvis W Contrast  12/11/2011  *RADIOLOGY REPORT*  Clinical Data: Rectal bleeding today  CT ABDOMEN AND PELVIS WITH CONTRAST  Technique:  Multidetector CT imaging of the abdomen and pelvis was performed following the standard protocol during bolus administration of intravenous contrast.  Contrast: 80mL OMNIPAQUE IOHEXOL 300 MG/ML  SOLN  Comparison: CT urogram of 06/19/2010  Findings: Linear scarring is noted at both lung bases posteriorly. Moderate cardiomegaly is present with pacer leads noted.  The liver enhances with no focal abnormality and no ductal dilatation is seen.  Surgical clips are present from prior cholecystectomy.  The pancreas is normal in size and the pancreatic duct is not  dilated. The adrenal glands and spleen are unremarkable.  The stomach is decompressed and cannot be evaluated.  Low attenuation bilateral renal lesions are noted most consistent with cysts.  Delayed images show normal pelvocaliceal systems bilaterally.  The proximal ureters are normal in caliber.  Moderate atheromatous change is noted within the abdominal aorta.  The mesenteric vasculature appears patent.  There are multiple colonic diverticula scattered diffusely, but concentrated primarily in the rectosigmoid colon where there is significant diverticulosis present.  No definite colonic mass is seen and no diverticulitis is evident.  The only questionable abnormality is fullness in the base of the cecum posteriorly.  This may represent retained fecal material, but a soft tissue mass cannot be excluded at that site.  The appendix is well visualized and fills with air normally.  The terminal ileum is well opacified with oral contrast and is unremarkable.  The urinary bladder is moderately urine distended with no abnormality noted and the prostate is only slightly prominent.  No bony abnormality is seen.  IMPRESSION:  1.  There are diffuse colonic diverticula, concentrated primarily within the rectosigmoid colon where there is significant diverticulosis present. 2.  Probable fecal material in the base of the cecum but a soft tissue mass at that site cannot be excluded. 3.  The appendix and terminal ileum appear normal.   Original Report Authenticated By: Dwyane Dee, M.D.      ASSESSMENT / PLAN:  1. GI bleed, presumed diverticular hemorrhage. Resolving. Passed small amount of maroon blood with BM at lunch but suspect old blood. Toleratiing full liquids. If no further active bleeding he can hopefully go home tomorrow.    2. Anemia of acute blood loss. Hemoglobin down from 14.5 on 11/15/11 to 10.2 but stable overnight. No further bleeding.     3. Diverticular disease by CTscan. History of recurrent  diverticulitis, last episode a few weeks ago. He has mild LLQ tenderness on exam but no diverticulitis by CTscan.   4. History of heart failure, s/p ICD. History of atrial fibrillation. Followed by [facility].   5. Abnormal CTscan, cecal mass vrs fecal material. Doubt any relationship with #1 but needs colonoscopy at some point. Patient is not interested at this point.      LOS: 1 day   Willette Cluster  12/12/2011, 10:05 AM

## 2011-12-12 NOTE — Progress Notes (Signed)
Thick blood clot

## 2011-12-12 NOTE — ED Provider Notes (Signed)
Medical screening examination/treatment/procedure(s) were performed by non-physician practitioner and as supervising physician I was immediately available for consultation/collaboration.  Adison Jerger R. Gearl Kimbrough, MD 12/12/11 0703 

## 2011-12-13 DIAGNOSIS — K922 Gastrointestinal hemorrhage, unspecified: Secondary | ICD-10-CM | POA: Diagnosis not present

## 2011-12-13 DIAGNOSIS — I5023 Acute on chronic systolic (congestive) heart failure: Secondary | ICD-10-CM | POA: Diagnosis not present

## 2011-12-13 DIAGNOSIS — I4891 Unspecified atrial fibrillation: Secondary | ICD-10-CM | POA: Diagnosis not present

## 2011-12-13 DIAGNOSIS — D62 Acute posthemorrhagic anemia: Secondary | ICD-10-CM | POA: Diagnosis not present

## 2011-12-13 LAB — BASIC METABOLIC PANEL
BUN: 33 mg/dL — ABNORMAL HIGH (ref 6–23)
GFR calc Af Amer: >90 mL/min (ref >90)
GFR calc non Af Amer: 87 mL/min — ABNORMAL LOW (ref >90)
Potassium: 4.5 mEq/L (ref 3.5–5.1)
Sodium: 139 mEq/L (ref 135–145)

## 2011-12-13 LAB — CBC
MCHC: 32.5 g/dL (ref 30.0–36.0)
RDW: 14.3 % (ref 11.5–15.5)

## 2011-12-13 MED ORDER — CARVEDILOL 12.5 MG PO TABS: 12.5000 mg | ORAL_TABLET | Freq: Two times a day (BID) | ORAL | Status: DC

## 2011-12-13 MED ORDER — ASPIRIN 325 MG PO TBEC: 325.0000 mg | DELAYED_RELEASE_TABLET | Freq: Every day | ORAL | Status: DC

## 2011-12-13 MED ORDER — FLUTICASONE PROPIONATE 50 MCG/ACT NA SUSP: 1.0000 | Freq: Every day | NASAL | Status: DC

## 2011-12-13 NOTE — Plan of Care (Signed)
Problem: Phase II Progression Outcomes Goal: No active bleeding Outcome: Completed/Met Date Met:  12/13/11 No blood in stool HGB stable

## 2011-12-13 NOTE — Plan of Care (Signed)
Problem: Discharge Progression Outcomes Goal: Barriers To Progression Addressed/Resolved Outcome: Completed/Met Date Met:  12/13/11 Patient have regular diet and  And tolerating well

## 2011-12-13 NOTE — Care Management Note (Signed)
    Page 1 of 1   12/13/2011     10:56:23 AM   CARE MANAGEMENT NOTE 12/13/2011  Patient:  [name], [name]   Account Number:  [id number]  Date Initiated:  12/13/2011  Documentation initiated by:  Letha Cape  Subjective/Objective Assessment:   dx gib  admit- lives with spouse, pta independent.     Action/Plan:   Anticipated DC Date:  12/13/2011   Anticipated DC Plan:  HOME/SELF CARE      DC Planning Services  CM consult      Choice offered to / List presented to:             Status of service:  Completed, signed off Medicare Important Message given?   (If response is "NO", the following Medicare IM given date fields will be blank) Date Medicare IM given:   Date Additional Medicare IM given:    Discharge Disposition:  HOME/SELF CARE  Per UR Regulation:  Reviewed for med. necessity/level of care/duration of stay  If discussed at Long Length of Stay Meetings, dates discussed:    Comments:  12/13/11 10:55 Letha Cape RN,k BSN [phone] patient lives with spouse, pta independent.  Patient has medication coverage and transportation at dc.  No needs anticipated.

## 2011-12-13 NOTE — Plan of Care (Signed)
Problem: Phase II Progression Outcomes Goal: Tolerating diet Outcome: Completed/Met Date Met:  12/13/11 Ate diet and tolerated

## 2011-12-13 NOTE — Discharge Summary (Signed)
Physician Discharge Summary  [name] ZOX:[id number] DOB: 02-09-1945 DOA: 12/11/2011  PCP: Hannah Beat, MD  Admit date: 12/11/2011 Discharge date: 12/13/2011  Time spent: 40 min.  Recommendations for Outpatient Follow-up:  1. Patient advised to wait 5 days after last rectal bleeding before restarting aspirin 2. Follow up with cardiology for exertional tachycardia.  Medications changed to coreg. 3. Follow up with GI.  Patient will need colonoscopy (if he will consent) for cecal mass.  Discharge Diagnoses:  Principal Problem:  *GI bleed Active Problems:  HYPERLIPIDEMIA  CAD, UNSPECIFIED SITE  ATRIAL FIBRILLATION WITH RAPID VENTRICULAR RESPONSE  COPD, MILD  Acute on chronic systolic heart failure  Diverticulosis  Acute posthemorrhagic anemia   Discharge Condition: stable.  No bleeding for 30+ hours.  Diet recommendation: Heart healthy.  Filed Weights   12/11/11 1817  Weight: 63.549 kg (140 lb 1.6 oz)    History of present illness:  [name] is a 66 y.o. male with history of atrial fibrillation who presents the Ed via EMS because of repeat bloody BMs. Patient is known to Dr. Leone Payor for history of diverticular disease. He was last seen in 2010 for dysphagia and diarrhea. Colonoscopy recommended but patient didn't have it done. He also vomited once brown not coffee ground emesis.  Hospital Course:   GI Bleed.  Lower. Presumed diverticular.  [facility] was consulted and guided management, but the patient refused colonoscopy.   Last episode of blood noted was a small clot at 5:00 am 11/7. Hgb stable today.   Patient tolerating solid diet.  Requesting discharge to home. Patient has multiple complaints about the quality of his stools (explosive, wet, sometimes incontinent).  I recommended metamucil with 8 glasses of liquid daily and GI follow up.  HTN  Currently normotensive.  BP meds initially held.  Coreg 12.5 mg bid started on 11/7 for episodes of tachycardia  with exertion (RVR)  Afib.  Aspirin being held for a short period due GI bleed.  On Digoxin.  Had episodes of RVR with exertion (140s).  Ambulated in the hallway on 12/13/2011 with telemetry in place.  Briefly pulse rose to 115 but dropped immediately with rest.  I have asked the patient to schedule a follow up appointment with his cardiologist (Dr. [name])  Cecal Mass  Appeared on CT.  Unclear if this is stool versus mass.  Patient aware he will need a colonoscopy to follow up.  History of heart failure, s/p ICD.  No signs of active heart failure issues.  Followed by [facility].      Consultations:  [facility]  Discharge Exam: Filed Vitals:   12/12/11 2115 12/13/11 0537 12/13/11 0841 12/13/11 1038  BP: 132/78 112/65 98/67   Pulse: 74 95 101 91  Temp: 98.6 F (37 C) 98.1 F (36.7 C)    TempSrc: Oral Oral    Resp: 20 18    Height:      Weight:      SpO2: 97% 97%      General: A&O, NAD Cardiovascular: irreg, irreg no M/R/G Respiratory: CTA no W/C/R Abdomen:  Soft, good bs, nt, nd Extremities:  No edema.  Discharge Instructions  Discharge Orders    Future Appointments: Provider: Department: Dept Phone: Center:   12/23/2011 11:40 AM [facility] [place]) [phone] [facility]   01/14/2012 3:45 PM Iva Boop, MD [facility] [phone] [facility]     Future Orders Please Complete By Expires   Diet - low sodium  heart healthy      Increase activity slowly          Medication List     As of 12/13/2011  1:47 PM    STOP taking these medications         losartan 50 MG tablet   Commonly known as: COZAAR      TAKE these medications         albuterol 108 (90 BASE) MCG/ACT inhaler   Commonly known as: PROVENTIL HFA;VENTOLIN HFA   Inhale 2 puffs into the lungs every 6 (six) hours as needed. As needed for shortness of breath.      aspirin 325 MG EC tablet   Take 1 tablet (325 mg total)  by mouth daily. Restart aspirin on 11/12 if you have had no further bleeding.      benzonatate 200 MG capsule   Commonly known as: TESSALON   TAKE 1 CAPSULE BY MOUTH 3 TIMES DAILY AS NEEDED FOR COUGH      carvedilol 12.5 MG tablet   Commonly known as: COREG   Take 1 tablet (12.5 mg total) by mouth 2 (two) times daily with a meal.      diazepam 5 MG tablet   Commonly known as: VALIUM   Take 1 tablet (5 mg total) by mouth every 6 (six) hours as needed.      digoxin 0.125 MG tablet   Commonly known as: LANOXIN   Take 1 tablet (0.125 mg total) by mouth daily.      fluticasone 50 MCG/ACT nasal spray   Commonly known as: FLONASE   Place 1 spray into the nose daily.      furosemide 20 MG tablet   Commonly known as: LASIX   Take 1 tablet (20 mg total) by mouth as needed.      HYDROcodone-acetaminophen 5-500 MG per tablet   Commonly known as: VICODIN   Take 1 tablet by mouth every 6 (six) hours as needed.      loperamide 2 MG capsule   Commonly known as: IMODIUM   Take 2-4 mg by mouth 4 (four) times daily as needed. As needed for loose stool. 2 tablets at onset and 1 tablet every hour as needed. Do not exceed more than 8 tablets in 24 hours.      metaxalone 800 MG tablet   Commonly known as: SKELAXIN   Take 1 tablet (800 mg total) by mouth as needed.      nitroGLYCERIN 0.4 MG SL tablet   Commonly known as: NITROSTAT   Place 0.4 mg under the tongue every 5 (five) minutes as needed. For cheat pain.      promethazine 25 MG tablet   Commonly known as: PHENERGAN   Take 25 mg by mouth every 6 (six) hours as needed. As needed for nausea/vomiting.      SUDAFED 12 HOUR PO   Take 2 tablets by mouth 2 (two) times daily as needed. As needed for congestion.      traMADol 50 MG tablet   Commonly known as: ULTRAM   Take 50 mg by mouth every 6 (six) hours as needed. For pain.      zolpidem 12.5 MG CR tablet   Commonly known as: AMBIEN CR   Take 12.5 mg by mouth at bedtime as needed. For  insomnia.           Follow-up Information    Follow up with Stan Head, MD. On 01/14/2012. (at 3:45pm)    Contact  information:   [address] [place] [address] [phone]       Schedule an appointment as soon as possible for a visit with Lewayne Bunting, MD.   Contact information:   [address] [place] [address] [phone]       Follow up with Hannah Beat, MD. Call in 3 weeks.   Contact information:   [address] [place] [address] [place] [address] [phone]           The results of significant diagnostics from this hospitalization (including imaging, microbiology, ancillary and laboratory) are listed below for reference.    Significant Diagnostic Studies: Ct Abdomen Pelvis W Contrast  12/11/2011  *RADIOLOGY REPORT*  Clinical Data: Rectal bleeding today  CT ABDOMEN AND PELVIS WITH CONTRAST  Technique:  Multidetector CT imaging of the abdomen and pelvis was performed following the standard protocol during bolus administration of intravenous contrast.  Contrast: 80mL OMNIPAQUE IOHEXOL 300 MG/ML  SOLN  Comparison: CT urogram of 06/19/2010  Findings: Linear scarring is noted at both lung bases posteriorly. Moderate cardiomegaly is present with pacer leads noted.  The liver enhances with no focal abnormality and no ductal dilatation is seen.  Surgical clips are present from prior cholecystectomy.  The pancreas is normal in size and the pancreatic duct is not dilated. The adrenal glands and spleen are unremarkable.  The stomach is decompressed and cannot be evaluated.  Low attenuation bilateral renal lesions are noted most consistent with cysts.  Delayed images show normal pelvocaliceal systems bilaterally.  The proximal ureters are normal in caliber.  Moderate atheromatous change is noted within the abdominal aorta.  The mesenteric vasculature appears patent.  There are multiple colonic diverticula  scattered diffusely, but concentrated primarily in the rectosigmoid colon where there is significant diverticulosis present.  No definite colonic mass is seen and no diverticulitis is evident.  The only questionable abnormality is fullness in the base of the cecum posteriorly.  This may represent retained fecal material, but a soft tissue mass cannot be excluded at that site.  The appendix is well visualized and fills with air normally.  The terminal ileum is well opacified with oral contrast and is unremarkable.  The urinary bladder is moderately urine distended with no abnormality noted and the prostate is only slightly prominent.  No bony abnormality is seen.  IMPRESSION:  1.  There are diffuse colonic diverticula, concentrated primarily within the rectosigmoid colon where there is significant diverticulosis present. 2.  Probable fecal material in the base of the cecum but a soft tissue mass at that site cannot be excluded. 3.  The appendix and terminal ileum appear normal.   Original Report Authenticated By: Dwyane Dee, M.D.     Labs: Basic Metabolic Panel:  Lab 12/13/11 2440 12/12/11 0541 12/11/11 1108  NA 139 138 139  K 4.5 4.2 4.7  CL 106 103 106  CO2 26 26 24   GLUCOSE 98 86 77  BUN 33* 22 32*  CREATININE 0.89 0.99 0.94  CALCIUM 8.5 8.5 8.1*  MG -- -- --  PHOS -- -- --   Liver Function Tests:  Lab 12/11/11 1108  AST 12  ALT 8  ALKPHOS 71  BILITOT 0.4  PROT 5.2*  ALBUMIN 2.7*   CBC:  Lab 12/13/11 0455 12/12/11 2005 12/12/11 0541 12/11/11 1917 12/11/11 1108  WBC 8.9 8.5 8.0 8.3 9.8  NEUTROABS -- -- -- -- 7.0  HGB  9.3* 9.9* 10.2* 10.1* 11.1*  HCT 28.6* 30.4* 32.2* 31.6* 34.7*  MCV 86.9 88.4 87.7 88.0 89.0  PLT 183 193 197 188 182   BNP: BNP (last 3 results)  Basename 02/12/11 1614  PROBNP 291.0*     SignedConley Canal  [phone] [facility] 12/13/2011, 1:47 PM    Attending -Patient seen and examined, no further rectal bleeding.  Hemoglobin and hematocrit remain stable. Rate control significantly better today, with ambulation heart rate in the low 100s. Patient asymptomatic. We'll continue with Coreg and digoxin. Systolic blood pressure in the high 90s, will hold off on starting Cozaar right away, recommended to start in the next few weeks when he follows up with his primary cardiologist and primary care practitioner. He has been asked to hold his aspirin for 5 days before resuming. He will need a colonoscopy which he has refused in the past at some point when he is agreeable to it. Agree with the assessment and plan as outlined above.  Windell Norfolk MD

## 2011-12-13 NOTE — Progress Notes (Signed)
Leda Min to be D/C'd Home per MD order.  Discharge instructions reviewed and discussed with the patient, all questions and concerns answered. Copy of instructions and scripts given to patient.   Alexa, Golebiewski  Home Medication Instructions ZOX:[id number]   Printed on:12/13/11 1334  Medication Information                    Pseudoephedrine HCl (SUDAFED 12 HOUR PO) Take 2 tablets by mouth 2 (two) times daily as needed. As needed for congestion.           benzonatate (TESSALON) 200 MG capsule TAKE 1 CAPSULE BY MOUTH 3 TIMES DAILY AS NEEDED FOR COUGH           HYDROcodone-acetaminophen (VICODIN) 5-500 MG per tablet Take 1 tablet by mouth every 6 (six) hours as needed.           metaxalone (SKELAXIN) 800 MG tablet Take 1 tablet (800 mg total) by mouth as needed.           furosemide (LASIX) 20 MG tablet Take 1 tablet (20 mg total) by mouth as needed.           diazepam (VALIUM) 5 MG tablet Take 1 tablet (5 mg total) by mouth every 6 (six) hours as needed.           digoxin (LANOXIN) 0.125 MG tablet Take 1 tablet (0.125 mg total) by mouth daily.           loperamide (IMODIUM) 2 MG capsule Take 2-4 mg by mouth 4 (four) times daily as needed. As needed for loose stool. 2 tablets at onset and 1 tablet every hour as needed. Do not exceed more than 8 tablets in 24 hours.           promethazine (PHENERGAN) 25 MG tablet Take 25 mg by mouth every 6 (six) hours as needed. As needed for nausea/vomiting.           traMADol (ULTRAM) 50 MG tablet Take 50 mg by mouth every 6 (six) hours as needed. For pain.           albuterol (PROVENTIL HFA;VENTOLIN HFA) 108 (90 BASE) MCG/ACT inhaler Inhale 2 puffs into the lungs every 6 (six) hours as needed. As needed for shortness of breath.           nitroGLYCERIN (NITROSTAT) 0.4 MG SL tablet Place 0.4 mg under the tongue every 5 (five) minutes as needed. For cheat pain.           zolpidem (AMBIEN CR) 12.5 MG CR tablet Take 12.5 mg by mouth at  bedtime as needed. For insomnia.           aspirin 325 MG EC tablet Take 1 tablet (325 mg total) by mouth daily. Restart aspirin on 11/12 if you have had no further bleeding.           carvedilol (COREG) 12.5 MG tablet Take 1 tablet (12.5 mg total) by mouth 2 (two) times daily with a meal.           fluticasone (FLONASE) 50 MCG/ACT nasal spray Place 1 spray into the nose daily.             Patients skin is clean, dry and intact no evidence of skin break down. IV site discontinued and catheter remains intact. Site without signs and symptoms of complications. Dressing and pressure applied.  Patient escorted to car by NT in a wheelchair,  no distress noted upon  discharge.  Bing Quarry 12/13/2011 1:34 PM

## 2011-12-13 NOTE — Plan of Care (Signed)
Problem: Phase II Progression Outcomes Goal: Tolerating diet Outcome: Progressing Pt started on healthy Heart diet today

## 2011-12-13 NOTE — Plan of Care (Signed)
Problem: Discharge Progression Outcomes Goal: Stools guaiac negative Outcome: Adequate for Discharge Not visual present

## 2011-12-13 NOTE — Plan of Care (Signed)
Problem: Discharge Progression Outcomes Goal: Activity appropriate for discharge plan Outcome: Completed/Met Date Met:  12/13/11 Patient up and mobile

## 2011-12-16 ENCOUNTER — Telehealth: Payer: Self-pay | Admitting: Internal Medicine

## 2011-12-16 NOTE — Telephone Encounter (Signed)
Pt is requesting a work in appt with Dr [name].  He states he was discharged from [facility] after GI bleeding and they told him to f/u with Dr [name] asap for "some heart issues".   I explained that Dr [name] and his nurse are not in this office today and he is requesting a return call from Dr Lubertha Basque nurse, Tresa Endo, at "10:00am tomorrow am".

## 2011-12-16 NOTE — Telephone Encounter (Signed)
Pt wants a sooner appt than 12/10 because he have a defib change out and he was in the hospital with GI issues and he wants to discuss being seen sooner

## 2011-12-17 NOTE — Telephone Encounter (Signed)
Called patient and lmom for him to return my call

## 2011-12-18 NOTE — Telephone Encounter (Signed)
Why did they d/c Cozaar in the hospital.  Starting 1979 had episodes of when sitting to have bowel movement sits down, starts feeling like going to have bowel movement, he gets light headed, extreme weakness and begins to perspire  He had numbness in his fingertips on both hands and upper left side of lip. Has to go lay down because if not he will fall down.  Last 1-2 minutes.  He has just been d/c'd from the hospital and has concerns of the medication being stopped.  This has not happened since 1993-1994.  Concerned

## 2011-12-18 NOTE — Telephone Encounter (Signed)
I have a long discussion with the patient and have answered numerous questions regarding his care.  i have asked to call Dr Marvell Fuller office for his questions regarding follow up appointment and labs.  I have encouraged him to go through with the colonoscopy as it will help determine his plan of care going forward.  He says he is going to have the test done.  I will discuss with Dr [name] as to the timing of his ICD generator change scheduled for 01/10/12

## 2011-12-18 NOTE — Telephone Encounter (Signed)
F/u   Returning call back to nurse from yesterday.  

## 2011-12-22 ENCOUNTER — Other Ambulatory Visit: Payer: Self-pay | Admitting: Family Medicine

## 2011-12-23 ENCOUNTER — Other Ambulatory Visit: Payer: Medicare Other

## 2011-12-23 NOTE — Telephone Encounter (Signed)
Called and spoke with patient.  I have discussed with Dr [name], he needs to proceed with colonoscopy and see if he can find out what is going on there before we proceed with generator change

## 2011-12-23 NOTE — Telephone Encounter (Signed)
Ok to refill #50, 3 refills 

## 2011-12-24 ENCOUNTER — Telehealth: Payer: Self-pay | Admitting: Internal Medicine

## 2011-12-24 NOTE — Telephone Encounter (Signed)
Patient was recently seen in the hospital for a GI bleed and it was recommended to the patient he have colonoscopy.  He declined at the time.  He was scheduled for a ICD generator change in December, but Dr. [name] has told the patient that he needs to have the colonoscopy prior to scheduling his procedure.  He has a history of heart failure and inplanted ICD.  I have moved up his post hospital appt to 12/27/11 to further discuss with Dr. Leone Payor

## 2011-12-24 NOTE — Telephone Encounter (Signed)
Left message for patient to call back Cell phone is not accepting calls at this time

## 2011-12-27 ENCOUNTER — Ambulatory Visit (INDEPENDENT_AMBULATORY_CARE_PROVIDER_SITE_OTHER): Payer: Federal, State, Local not specified - PPO | Admitting: Internal Medicine

## 2011-12-27 ENCOUNTER — Encounter: Payer: Self-pay | Admitting: Internal Medicine

## 2011-12-27 ENCOUNTER — Other Ambulatory Visit (INDEPENDENT_AMBULATORY_CARE_PROVIDER_SITE_OTHER): Payer: Federal, State, Local not specified - PPO

## 2011-12-27 VITALS — BP 124/78 | HR 76 | Ht 63.0 in | Wt 144.0 lb

## 2011-12-27 DIAGNOSIS — K922 Gastrointestinal hemorrhage, unspecified: Secondary | ICD-10-CM | POA: Diagnosis not present

## 2011-12-27 DIAGNOSIS — D62 Acute posthemorrhagic anemia: Secondary | ICD-10-CM | POA: Diagnosis not present

## 2011-12-27 DIAGNOSIS — R933 Abnormal findings on diagnostic imaging of other parts of digestive tract: Secondary | ICD-10-CM | POA: Diagnosis not present

## 2011-12-27 LAB — CBC WITH DIFFERENTIAL/PLATELET
Basophils Absolute: 0.1 10*3/uL (ref 0.0–0.1)
Hemoglobin: 8.4 g/dL — ABNORMAL LOW (ref 13.0–17.0)
Lymphocytes Relative: 26.8 % (ref 12.0–46.0)
Monocytes Relative: 6.5 % (ref 3.0–12.0)
Neutro Abs: 5.9 10*3/uL (ref 1.4–7.7)
RBC: 3.16 Mil/uL — ABNORMAL LOW (ref 4.22–5.81)
RDW: 17 % — ABNORMAL HIGH (ref 11.5–14.6)

## 2011-12-27 MED ORDER — NA SULFATE-K SULFATE-MG SULF 17.5-3.13-1.6 GM/177ML PO SOLN: ORAL | Status: DC

## 2011-12-27 NOTE — Patient Instructions (Addendum)
Your physician has requested that you go to the basement for the following lab work before leaving today: CBC/diff  You have been scheduled for a colonoscopy with propofol. Please follow written instructions given to you at your visit today.  Please use the suprep kit you have been given today. If you use inhalers (even only as needed) or a CPAP machine, please bring them with you on the day of your procedure.  Thank you for choosing me and  Gastroenterology.  Iva Boop, M.D., [facility]

## 2011-12-27 NOTE — Progress Notes (Signed)
[facility] Subjective:    Patient ID: [name], male    DOB: 03-Aug-1945, 66 y.o.   MRN: [id number]  HPI He is here to f/u after hospitalization for painless GI bleed. Hgb low 9.3. CT suggested possible cecal mass though probably stool. He declined colonoscopy. No more bleeding. Main c/o now is exhaustion and fatigue. Due for AICD change in December as battery getting low. Says Dr. [name] is waiting to hear what I say before doing.  Allergies  Allergen Reactions  . Ace Inhibitors Other (See Comments) and Cough    muscle pain.   . Codeine Other (See Comments)    "head wants to explode."  . Penicillins Swelling    "started at point of injection; w/in 3 [name] my upper earm was swollen 3 times normal"   Outpatient Prescriptions Prior to Visit  Medication Sig Dispense Refill  . albuterol (PROVENTIL HFA;VENTOLIN HFA) 108 (90 BASE) MCG/ACT inhaler Inhale 2 puffs into the lungs every 6 (six) hours as needed. As needed for shortness of breath.      [name] aspirin 325 MG EC tablet Take 1 tablet (325 mg total) by mouth daily. Restart aspirin on 11/12 if you have had no further bleeding.      . benzonatate (TESSALON) 200 MG capsule TAKE 1 CAPSULE BY MOUTH 3 TIMES DAILY AS NEEDED FOR COUGH  30 capsule  1  . carvedilol (COREG) 12.5 MG tablet Take 1 tablet (12.5 mg total) by mouth 2 (two) times daily with a meal.  180 tablet  1  . diazepam (VALIUM) 5 MG tablet Take 1 tablet (5 mg total) by mouth every 6 (six) hours as needed.  40 tablet  5  . digoxin (LANOXIN) 0.125 MG tablet Take 1 tablet (0.125 mg total) by mouth daily.  30 tablet  1  . fluticasone (FLONASE) 50 MCG/ACT nasal spray Place 1 spray into the nose daily.  16 g  0  . furosemide (LASIX) 20 MG tablet Take 1 tablet (20 mg total) by mouth as needed.  30 tablet  5  . HYDROcodone-acetaminophen (VICODIN) 5-500 MG per tablet TAKE 1 TABLET BY MOUTH EVERY 6 HOURS AS NEEDED FOR PAIN  50 tablet  3  . loperamide (IMODIUM) 2 MG capsule Take  2-4 mg by mouth 4 (four) times daily as needed. As needed for loose stool. 2 tablets at onset and 1 tablet every hour as needed. Do not exceed more than 8 tablets in 24 hours.      . metaxalone (SKELAXIN) 800 MG tablet Take 1 tablet (800 mg total) by mouth as needed.  90 tablet  5  . nitroGLYCERIN (NITROSTAT) 0.4 MG SL tablet Place 0.4 mg under the tongue every 5 (five) minutes as needed. For cheat pain.      . promethazine (PHENERGAN) 25 MG tablet Take 25 mg by mouth every 6 (six) hours as needed. As needed for nausea/vomiting.      . Pseudoephedrine HCl (SUDAFED 12 HOUR PO) Take 2 tablets by mouth 2 (two) times daily as needed. As needed for congestion.      . traMADol (ULTRAM) 50 MG tablet Take 50 mg by mouth every 6 (six) hours as needed. For pain.      [name] zolpidem (AMBIEN CR) 12.5 MG CR tablet Take 12.5 mg by mouth at bedtime as needed. For insomnia.       Last reviewed on 12/27/2011  3:50 PM by Iva Boop, MD Past Medical History  Diagnosis Date  .  ICD (implantable cardiac defibrillator) in place   . Congestive heart failure, unspecified     Reports EF of 26%.   [name] CAD (coronary artery disease), autologous vein bypass graft   . Chronic airway obstruction, not elsewhere classified   . Allergic rhinitis, cause unspecified   . HLD (hyperlipidemia)   . Noncompliance   . Diverticulosis     by CT scan  . Pacemaker   . Paroxysmal ventricular tachycardia   . Atrial fibrillation   . Acute myocardial infarction, unspecified site, episode of care unspecified 1998  . Anginal pain 1998  . Acute lower GI bleeding 12/11/2011    "first time" (12/11/2011)   Past Surgical History  Procedure Date  . Cardiac defibrillator placement 2004  . Tonsillectomy and adenoidectomy ~ 1951  . Cholecystectomy 1/ 2012  . Shoulder arthroscopy w/ rotator cuff repair twice    right (12/11/2011)  . Insert / replace / remove pacemaker   . Coronary angioplasty 1998, ? 2000's  . Partial knee arthroplasty ~ 2000     left   History   Social History  . Marital Status: Married    Spouse Name: N/A    Number of Children: 0  . Years of Education: N/A   Occupational History  . 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 Smoking status: Current Every Day Smoker -- 0.2 packs/day for 50 years    Types: Cigarettes, Cigars  . Smokeless tobacco: Never Used  . Alcohol Use: No  . Drug Use: No  . Sexually Active: No     Family History  Problem Relation Age of Onset  . Diabetes Father   . Stroke Mother   . Cancer Father     ?     Review of Systems As above    Objective:   Physical Exam General:  NAD Eyes:   anicteric Lungs:  Clear - AICD L [place] area Heart:  S1S2 with increased P2 no rubs, murmurs or gallops Abdomen:  soft and nontender, BS+ Ext:   no edema    Data Reviewed:  Lab Results  Component Value Date   WBC 8.9 12/13/2011   HGB 9.3* 12/13/2011   HCT 28.6* 12/13/2011   MCV 86.9 12/13/2011   PLT 183 12/13/2011   Hospital consult, dc summary     Assessment & Plan:   1. GI bleed   2. Acute blood loss anemia with fatigue, weakness  3. Abnormal CT scan, colon    1. CBC 2. Colonoscopy at hospital (AICD and EF < 35%) 3. He is asking for admit to prep due to fatigue - wait before committing 4. ? Fe infusion, he wants to avoid blood transfusion if possible  Lab Results  Component Value Date   WBC 9.3 12/27/2011   HGB 8.4 Repeated and verified X2.* 12/27/2011   HCT 26.6 Repeated and verified X2.* 12/27/2011   MCV 84.3 12/27/2011   PLT 432.0* 12/27/2011   Will go ahead and see if he can get an iron infusion next week. He is to call me 48 hours prior to colonoscopy re: does he need obs admit to prep.  WU:[id number] Copland, MD, Lewayne Bunting, MD

## 2011-12-27 NOTE — Progress Notes (Signed)
Quick Note:  Hgb low - is symptomatic Please arrange IV infusion to expedite increase in Hgb ______

## 2011-12-30 NOTE — Progress Notes (Signed)
Quick Note:  Sorry - IV iron ______

## 2011-12-31 ENCOUNTER — Other Ambulatory Visit: Payer: Self-pay

## 2011-12-31 DIAGNOSIS — D649 Anemia, unspecified: Secondary | ICD-10-CM

## 2011-12-31 DIAGNOSIS — D509 Iron deficiency anemia, unspecified: Secondary | ICD-10-CM

## 2012-01-01 ENCOUNTER — Telehealth: Payer: Self-pay | Admitting: Internal Medicine

## 2012-01-01 ENCOUNTER — Other Ambulatory Visit (HOSPITAL_COMMUNITY): Payer: Self-pay | Admitting: Internal Medicine

## 2012-01-01 NOTE — Telephone Encounter (Signed)
Called and spoke with patient let him know we would be glad to see him.  He is at the barber and getting ready for the holidays.  Wants to know if it is necessary to come in to see the MD.  I told him that Dr Johney Frame had spoken with Dr Leone Payor and that we would be glad to take a look at him today.  He wants to think about it and call me back.  I told him we are open all day and close at 5pm but would want him here by 4.  He verbalized the understanding but wants to see how he gets along today and will call if he decides to come to see Dr Johney Frame today.  I let him know again we would be happy to see him today if he wishes.

## 2012-01-01 NOTE — Telephone Encounter (Signed)
F/U   Per Lavonna Rua from GI dept .    Colonoscopy next week 12/4.  Hemo  8.4 . C/O severe dizziness on yesterday. Today doing better.  Per Lavonna Rua please look at epic note from today

## 2012-01-01 NOTE — Telephone Encounter (Signed)
New Problem:    Patient called in because he has been having spells of extreme weakness where he comes close to collapsing.  Patient believes that this may be due to his medications and would like to know how to proceed.  Please call back.

## 2012-01-01 NOTE — Telephone Encounter (Signed)
I spoke with the patient this am.  He reports severe weakness and dizziness yesterday.  He has been weak and tired due to recent lower GI bleed and anemia.  Hgb 8.4.  He is scheduled for Feraheme on 01/07/12, and colonoscopy for 01/08/12.  His BP this am after taking his coreg was 113/85( he held his digoxin today).  He reports that yesterday he was weak, but after he took his digoxin and Coreg he was unable to get up.  "If I wasn't already lying down I would have fallen".  He reports that he feels "bad".  Patient denies blood in stool, dark or black stools, abdominal pain, or other GI complaints.  Discussed with Dr. Leone Payor- per Dr. Leone Payor patient needs to be seen today by Cardiology today.  I have left a message for Dr. Lubertha Basque nurse to call me back.  [name] is aware that we will contact him back once I speak with Cardiology.

## 2012-01-01 NOTE — Telephone Encounter (Signed)
Dr. Leone Payor spoke to Cardiology and the patient declined their appt offer.  They instructed the patient to call back if he changed his mind

## 2012-01-03 ENCOUNTER — Other Ambulatory Visit: Payer: Medicare Other

## 2012-01-05 HISTORY — PX: CATARACT EXTRACTION W/ INTRAOCULAR LENS IMPLANT: SHX1309

## 2012-01-06 ENCOUNTER — Telehealth: Payer: Self-pay | Admitting: Internal Medicine

## 2012-01-06 NOTE — Telephone Encounter (Signed)
Due to weakness the patient would like to be admitted to prep for a colonoscopy that is scheduled for 01/08/12.  Dr.  Leone Payor has been paged

## 2012-01-06 NOTE — Telephone Encounter (Signed)
Discussed with Dr. Leone Payor ok to admit after [facility].  Bed control states that the bed request needs to come from Short Stay after his [facility] tomorrow.  Dee at [facility] is aware they will request the bed.  Dr. Elzie Rings will notify Doug Sou, PA for orders.  The patient is aware.  He understands to be on a clear liquid diet tomorrow.

## 2012-01-07 ENCOUNTER — Encounter (HOSPITAL_COMMUNITY): Payer: Self-pay

## 2012-01-07 ENCOUNTER — Observation Stay (HOSPITAL_COMMUNITY)
Admission: RE | Admit: 2012-01-07 | Discharge: 2012-01-08 | Disposition: A | Discharge status: Home / Self Care | Payer: Medicare Other | Source: Ambulatory Visit | Attending: Internal Medicine | Admitting: Internal Medicine

## 2012-01-07 VITALS — BP 118/65 | HR 84 | Temp 97.8°F | Resp 21 | Ht 63.0 in | Wt 134.6 lb

## 2012-01-07 DIAGNOSIS — K5730 Diverticulosis of large intestine without perforation or abscess without bleeding: Secondary | ICD-10-CM | POA: Insufficient documentation

## 2012-01-07 DIAGNOSIS — Z95810 Presence of automatic (implantable) cardiac defibrillator: Secondary | ICD-10-CM | POA: Insufficient documentation

## 2012-01-07 DIAGNOSIS — I2510 Atherosclerotic heart disease of native coronary artery without angina pectoris: Secondary | ICD-10-CM | POA: Insufficient documentation

## 2012-01-07 DIAGNOSIS — D500 Iron deficiency anemia secondary to blood loss (chronic): Secondary | ICD-10-CM | POA: Diagnosis not present

## 2012-01-07 DIAGNOSIS — I4891 Unspecified atrial fibrillation: Secondary | ICD-10-CM | POA: Insufficient documentation

## 2012-01-07 DIAGNOSIS — Z79899 Other long term (current) drug therapy: Secondary | ICD-10-CM | POA: Diagnosis not present

## 2012-01-07 DIAGNOSIS — D649 Anemia, unspecified: Secondary | ICD-10-CM

## 2012-01-07 DIAGNOSIS — K5790 Diverticulosis of intestine, part unspecified, without perforation or abscess without bleeding: Secondary | ICD-10-CM

## 2012-01-07 DIAGNOSIS — K922 Gastrointestinal hemorrhage, unspecified: Secondary | ICD-10-CM | POA: Diagnosis not present

## 2012-01-07 DIAGNOSIS — R933 Abnormal findings on diagnostic imaging of other parts of digestive tract: Secondary | ICD-10-CM

## 2012-01-07 DIAGNOSIS — I252 Old myocardial infarction: Secondary | ICD-10-CM | POA: Diagnosis not present

## 2012-01-07 DIAGNOSIS — I509 Heart failure, unspecified: Secondary | ICD-10-CM | POA: Insufficient documentation

## 2012-01-07 DIAGNOSIS — E785 Hyperlipidemia, unspecified: Secondary | ICD-10-CM | POA: Diagnosis not present

## 2012-01-07 DIAGNOSIS — D509 Iron deficiency anemia, unspecified: Secondary | ICD-10-CM

## 2012-01-07 LAB — CBC
MCH: 25.8 pg — ABNORMAL LOW (ref 26.0–34.0)
MCHC: 30.8 g/dL (ref 30.0–36.0)
Platelets: 300 10*3/uL (ref 150–400)

## 2012-01-07 MED ORDER — PSEUDOEPHEDRINE HCL ER 120 MG PO TB12
120.0000 mg | ORAL_TABLET | Freq: Two times a day (BID) | ORAL | Status: DC | PRN
  Filled 2012-01-07: qty 1

## 2012-01-07 MED ORDER — ACETAMINOPHEN 650 MG RE SUPP: 650.0000 mg | Freq: Four times a day (QID) | RECTAL | Status: DC | PRN

## 2012-01-07 MED ORDER — SODIUM CHLORIDE 0.9 % IJ SOLN: 3.0000 mL | INTRAMUSCULAR | Status: DC | PRN

## 2012-01-07 MED ORDER — DIGOXIN 125 MCG PO TABS
0.1250 mg | ORAL_TABLET | Freq: Every day | ORAL | Status: DC
  Administered 2012-01-07: 0.125 mg via ORAL
  Filled 2012-01-07 (×2): qty 1

## 2012-01-07 MED ORDER — ALBUTEROL SULFATE HFA 108 (90 BASE) MCG/ACT IN AERS
2.0000 | INHALATION_SPRAY | Freq: Four times a day (QID) | RESPIRATORY_TRACT | Status: DC | PRN
  Filled 2012-01-07: qty 6.7

## 2012-01-07 MED ORDER — NON FORMULARY: 12.5000 mg | Freq: Every evening | Status: DC | PRN

## 2012-01-07 MED ORDER — ZOLPIDEM TARTRATE 5 MG PO TABS: 5.0000 mg | ORAL_TABLET | Freq: Every evening | ORAL | Status: DC | PRN

## 2012-01-07 MED ORDER — SODIUM CHLORIDE 0.9 % IV SOLN
INTRAVENOUS | Status: DC
  Administered 2012-01-07: 16:00:00 via INTRAVENOUS
  Administered 2012-01-07: 250 mL via INTRAVENOUS
  Administered 2012-01-08: 500 mL via INTRAVENOUS

## 2012-01-07 MED ORDER — SODIUM CHLORIDE 0.9 % IJ SOLN: 3.0000 mL | Freq: Two times a day (BID) | INTRAMUSCULAR | Status: DC

## 2012-01-07 MED ORDER — ONDANSETRON HCL 4 MG/2ML IJ SOLN
4.0000 mg | Freq: Four times a day (QID) | INTRAMUSCULAR | Status: DC | PRN
  Filled 2012-01-07: qty 2

## 2012-01-07 MED ORDER — ACETAMINOPHEN 325 MG PO TABS: 650.0000 mg | ORAL_TABLET | Freq: Four times a day (QID) | ORAL | Status: DC | PRN

## 2012-01-07 MED ORDER — SODIUM CHLORIDE 0.9 % IV SOLN: INTRAVENOUS | Status: DC

## 2012-01-07 MED ORDER — SODIUM CHLORIDE 0.9 % IV SOLN: 250.0000 mL | INTRAVENOUS | Status: DC | PRN

## 2012-01-07 MED ORDER — ONDANSETRON HCL 4 MG PO TABS
4.0000 mg | ORAL_TABLET | Freq: Four times a day (QID) | ORAL | Status: DC | PRN
  Filled 2012-01-07: qty 1

## 2012-01-07 MED ORDER — CARVEDILOL 12.5 MG PO TABS
12.5000 mg | ORAL_TABLET | Freq: Two times a day (BID) | ORAL | Status: DC
  Administered 2012-01-07 – 2012-01-08 (×2): 12.5 mg via ORAL
  Filled 2012-01-07 (×4): qty 1

## 2012-01-07 MED ORDER — FERUMOXYTOL INJECTION 510 MG/17 ML
510.0000 mg | INTRAVENOUS | Status: DC
  Administered 2012-01-07: 510 mg via INTRAVENOUS
  Filled 2012-01-07: qty 17

## 2012-01-07 MED ORDER — HYDROCODONE-ACETAMINOPHEN 5-325 MG PO TABS
1.0000 | ORAL_TABLET | Freq: Four times a day (QID) | ORAL | Status: DC | PRN
  Administered 2012-01-07: 2 via ORAL
  Filled 2012-01-07: qty 2

## 2012-01-07 NOTE — H&P (Signed)
Agree with Ms. Rise Mu note and plan

## 2012-01-07 NOTE — H&P (Signed)
Primary Care Physician:  Hannah Beat, MD Primary Gastroenterologist:  Dr. Leone Payor  CHIEF COMPLAINT:  Observation for colonoscopy prep  HPI: [name] is a 66 y.o. male who was seen in our office on 11/22 to f/u after hospitalization for painless GI bleed. Hgb was low 9.3 grams. CT suggested possible cecal mass though probably stool. He declined colonoscopy. No more bleeding. Main c/o now is exhaustion and fatigue. Due for AICD change in December as battery getting low. Says Dr. [name] is waiting to hear what GI says before doing.  Was scheduled for colonoscopy during the above office visit, but wanted to be admitted for preparation for colonoscopy on 12/4.  No further bleeding.  Had iron infusion today.    Past Medical History  Diagnosis Date  . ICD (implantable cardiac defibrillator) in place   . Congestive heart failure, unspecified     Reports EF of 26%.   [name] CAD (coronary artery disease), autologous vein bypass graft   . Chronic airway obstruction, not elsewhere classified   . Allergic rhinitis, cause unspecified   . HLD (hyperlipidemia)   . Noncompliance   . Diverticulosis     by CT scan  . Pacemaker   . Paroxysmal ventricular tachycardia   . Atrial fibrillation   . Acute myocardial infarction, unspecified site, episode of care unspecified 1998  . Anginal pain 1998  . Acute lower GI bleeding 12/11/2011    "first time" (12/11/2011)    Past Surgical History  Procedure Date  . Cardiac defibrillator placement 2004  . Tonsillectomy and adenoidectomy ~ 1951  . Cholecystectomy 1/ 2012  . Shoulder arthroscopy w/ rotator cuff repair twice    right (12/11/2011)  . Insert / replace / remove pacemaker   . Coronary angioplasty 1998, ? 2000's  . Partial knee arthroplasty ~ 2000    left    Prior to Admission medications   Medication Sig Start Date End Date Taking? Authorizing Provider  albuterol (PROVENTIL HFA;VENTOLIN HFA) 108 (90 BASE) MCG/ACT inhaler Inhale 2 puffs into  the lungs every 6 (six) hours as needed. As needed for shortness of breath.    Historical Provider, MD  aspirin 325 MG EC tablet Take 1 tablet (325 mg total) by mouth daily. Restart aspirin on 11/12 if you have had no further bleeding. 12/13/11   Tora Kindred York, PA  benzonatate (TESSALON) 200 MG capsule TAKE 1 CAPSULE BY MOUTH 3 TIMES DAILY AS NEEDED FOR COUGH 02/24/11   Hannah Beat, MD  carvedilol (COREG) 12.5 MG tablet Take 1 tablet (12.5 mg total) by mouth 2 (two) times daily with a meal. 12/13/11   Stephani Police, PA  diazepam (VALIUM) 5 MG tablet Take 1 tablet (5 mg total) by mouth every 6 (six) hours as needed. 11/04/11   Hannah Beat, MD  digoxin (LANOXIN) 0.125 MG tablet Take 1 tablet (0.125 mg total) by mouth daily. 11/15/11   Marinus Maw, MD  fluticasone (FLONASE) 50 MCG/ACT nasal spray Place 1 spray into the nose daily. 12/13/11   Stephani Police, PA  furosemide (LASIX) 20 MG tablet Take 1 tablet (20 mg total) by mouth as needed. 06/05/11   Rosalio Macadamia, NP  HYDROcodone-acetaminophen (VICODIN) 5-500 MG per tablet TAKE 1 TABLET BY MOUTH EVERY 6 HOURS AS NEEDED FOR PAIN 12/22/11   Hannah Beat, MD  loperamide (IMODIUM) 2 MG capsule Take 2-4 mg by mouth 4 (four) times daily as needed. As needed for loose stool. 2 tablets at onset and 1 tablet  every hour as needed. Do not exceed more than 8 tablets in 24 hours.    Historical Provider, MD  metaxalone (SKELAXIN) 800 MG tablet Take 1 tablet (800 mg total) by mouth as needed. 05/06/11   Hannah Beat, MD  Na Sulfate-K Sulfate-Mg Sulf (SUPREP BOWEL PREP) SOLN Use as directed 12/27/11   Iva Boop, MD  nitroGLYCERIN (NITROSTAT) 0.4 MG SL tablet Place 0.4 mg under the tongue every 5 (five) minutes as needed. For cheat pain.    Historical Provider, MD  promethazine (PHENERGAN) 25 MG tablet Take 25 mg by mouth every 6 (six) hours as needed. As needed for nausea/vomiting.    Historical Provider, MD  Pseudoephedrine HCl (SUDAFED 12 HOUR PO)  Take 2 tablets by mouth 2 (two) times daily as needed. As needed for congestion.    Historical Provider, MD  traMADol (ULTRAM) 50 MG tablet Take 50 mg by mouth every 6 (six) hours as needed. For pain.    Historical Provider, MD  zolpidem (AMBIEN CR) 12.5 MG CR tablet Take 12.5 mg by mouth at bedtime as needed. For insomnia. 08/15/11   Hannah Beat, MD    Current Facility-Administered Medications  Medication Dose Route Frequency Provider Last Rate Last Dose  . 0.9 %  sodium chloride infusion   Intravenous Continuous Iva Boop, MD 20 mL/hr at 01/07/12 1345 250 mL at 01/07/12 1345  . ferumoxytol [facility]) injection 510 mg  510 mg Intravenous Weekly Iva Boop, MD   510 mg at 01/07/12 1349    Allergies as of 01/07/2012 - Review Complete 01/07/2012  Allergen Reaction Noted  . Ace inhibitors Other (See Comments) and Cough 05/02/2010  . Codeine Other (See Comments)   . Penicillins Swelling     Family History  Problem Relation Age of Onset  . Diabetes Father   . Stroke Mother   . Cancer Father     ?    History   Social History  . Marital Status: Married    Spouse Name: N/A    Number of Children: 0  . Years of Education: N/A   Occupational History  . 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 Smoking status: Current Every Day Smoker -- 0.2 packs/day for 50 years    Types: Cigarettes, Cigars  . Smokeless tobacco: Never Used  . Alcohol Use: No  . Drug Use: No  . Sexually Active: No   Other Topics Concern  . Not on file   Social History Narrative  . No narrative on file    Review of Systems: Ten point ROS is O/W negative except as mentioned in HPI.  Physical Exam: Vital signs in last 24 hours: Temp:  [97.2 F (36.2 C)] 97.2 F (36.2 C) (12/03 1332) Pulse Rate:  [73] 73  (12/03 1332) Resp:  [16] 16  (12/03 1332) BP: (109)/(77) 109/77 mmHg (12/03 1332) SpO2:  [97 %] 97 % (12/03 1332)   General:   Alert,  Well-developed, well-nourished,  pleasant and cooperative in NAD Head:  Normocephalic and atraumatic. Eyes:  Sclera clear, no icterus.  Conjunctiva pink. Ears:  Normal auditory acuity. Mouth:  No deformity or lesions.  Oropharynx pink & moist. Lungs:  Clear throughout to auscultation.   No wheezes, crackles, or rhonchi.  AICD left [place] area Heart:  Regular rate and rhythm; no murmurs, clicks, rubs,  or gallops. Abdomen:  Soft, nontender and nondistended. No masses, hepatosplenomegaly or hernias noted. Normal bowel sounds, without guarding, and without  rebound.   Rectal:  Deferred until time of colonoscopy.   Msk:  Symmetrical without gross deformities. Normal posture. Pulses:  Normal pulses noted. Extremities:  Without clubbing or edema. Neurologic:  Alert and  oriented x4;  grossly normal neurologically. Skin:  Intact without significant lesions or rashes. Psych:  Alert and cooperative. Normal mood and affect.  Impression / Plan: -GIB and anemia:  Plan for colonoscopy 12/4.  Admit to observation for preparation.  Will check CBC.  Will start appropriate home medications.    LOS: 0 days   Taiwan Talcott D.  01/07/2012, 2:36 PM

## 2012-01-07 NOTE — Progress Notes (Signed)
First dose of bowel prep that patient had from MD office given per MD instructions.

## 2012-01-07 NOTE — Progress Notes (Signed)
Pt is an admit today for colonoscopy tomorrow (per dr Elberta Leatherwood)  He came to short stay today for injection of feraheme,  Called jamie, rn on 3 east  c report on short stay visit.  Pt will go to 1314

## 2012-01-08 ENCOUNTER — Ambulatory Visit (HOSPITAL_COMMUNITY)
Admission: RE | Admit: 2012-01-08 | Payer: Federal, State, Local not specified - PPO | Source: Ambulatory Visit | Admitting: Internal Medicine

## 2012-01-08 ENCOUNTER — Encounter (HOSPITAL_COMMUNITY): Payer: Self-pay

## 2012-01-08 ENCOUNTER — Encounter (HOSPITAL_COMMUNITY)
Admission: RE | Disposition: A | Discharge status: Home / Self Care | Payer: Self-pay | Source: Ambulatory Visit | Attending: Internal Medicine

## 2012-01-08 DIAGNOSIS — D500 Iron deficiency anemia secondary to blood loss (chronic): Secondary | ICD-10-CM | POA: Diagnosis not present

## 2012-01-08 DIAGNOSIS — K5730 Diverticulosis of large intestine without perforation or abscess without bleeding: Secondary | ICD-10-CM | POA: Diagnosis not present

## 2012-01-08 DIAGNOSIS — K922 Gastrointestinal hemorrhage, unspecified: Secondary | ICD-10-CM | POA: Diagnosis not present

## 2012-01-08 DIAGNOSIS — Z95810 Presence of automatic (implantable) cardiac defibrillator: Secondary | ICD-10-CM | POA: Diagnosis not present

## 2012-01-08 DIAGNOSIS — I509 Heart failure, unspecified: Secondary | ICD-10-CM | POA: Diagnosis not present

## 2012-01-08 DIAGNOSIS — I2510 Atherosclerotic heart disease of native coronary artery without angina pectoris: Secondary | ICD-10-CM | POA: Diagnosis not present

## 2012-01-08 HISTORY — PX: COLONOSCOPY: SHX5424

## 2012-01-08 SURGERY — COLONOSCOPY
Anesthesia: Moderate Sedation

## 2012-01-08 MED ORDER — FENTANYL CITRATE 0.05 MG/ML IJ SOLN
INTRAMUSCULAR | Status: AC
  Filled 2012-01-08: qty 4

## 2012-01-08 MED ORDER — MIDAZOLAM HCL 5 MG/5ML IJ SOLN
INTRAMUSCULAR | Status: DC | PRN
  Administered 2012-01-08 (×4): 2.5 mg via INTRAVENOUS

## 2012-01-08 MED ORDER — FENTANYL CITRATE 0.05 MG/ML IJ SOLN
INTRAMUSCULAR | Status: DC | PRN
  Administered 2012-01-08 (×4): 25 ug via INTRAVENOUS

## 2012-01-08 MED ORDER — MIDAZOLAM HCL 10 MG/2ML IJ SOLN
INTRAMUSCULAR | Status: AC
  Filled 2012-01-08: qty 4

## 2012-01-08 NOTE — Care Management Note (Signed)
    Page 1 of 1   01/08/2012     2:40:18 PM   CARE MANAGEMENT NOTE 01/08/2012  Patient:  [name], [name]   Account Number:  [id number]  Date Initiated:  01/08/2012  Documentation initiated by:  Lorenda Ishihara  Subjective/Objective Assessment:     Action/Plan:   Anticipated DC Date:  01/08/2012   Anticipated DC Plan:  HOME/SELF CARE         Choice offered to / List presented to:             Status of service:  Completed, signed off Medicare Important Message given?   (If response is "NO", the following Medicare IM given date fields will be blank) Date Medicare IM given:   Date Additional Medicare IM given:    Discharge Disposition:  HOME/SELF CARE  Per UR Regulation:  Reviewed for med. necessity/level of care/duration of stay  If discussed at Long Length of Stay Meetings, dates discussed:    Comments:

## 2012-01-08 NOTE — Discharge Summary (Signed)
Agree  DC diagnosis is Blood loss anemia, diverticulosis

## 2012-01-08 NOTE — Interval H&P Note (Signed)
History and Physical Interval Note:  01/08/2012 9:17 AM  [name]  has presented today for surgery, with the diagnosis of GI bleed [578.9] Abnormal CT scan, colon [793.4]  The various methods of treatment have been discussed with the patient and family. After consideration of risks, benefits and other options for treatment, the patient has consented to  Procedure(s) (LRB) with comments: COLONOSCOPY (N/A) as a surgical intervention .  The patient's history has been reviewed, patient examined, no change in status, stable for surgery.  I have reviewed the patient's chart and labs.  Questions were answered to the patient's satisfaction.     Stan Head, MD, [facility]

## 2012-01-08 NOTE — Op Note (Signed)
[facility] [address] [place], [address]   COLONOSCOPY PROCEDURE REPORT  PATIENT: [name], [name]  MR#: [id number] BIRTHDATE: 11-Apr-1945 , 66  yrs. old GENDER: Male ENDOSCOPIST: Iva Boop, MD, [facility] REFERRED [id number] Ward Chatters, M.D. PROCEDURE DATE:  01/08/2012 PROCEDURE:   Colonoscopy, diagnostic ASA CLASS:   Class IV INDICATIONS:an abnormal CT (? cecal mass) and hematochezia. MEDICATIONS: Fentanyl 100 mcg IV and Versed 10 mg IV  DESCRIPTION OF PROCEDURE:   After the risks benefits and alternatives of the procedure were thoroughly explained, informed consent was obtained.  A digital rectal exam revealed no abnormalities of the rectum and A digital rectal exam revealed the prostate was not enlarged.   The Pentax Colonoscope [id number] endoscope was introduced through the anus and advanced to the terminal ileum which was intubated for a short distance. No adverse events experienced.   The quality of the prep was Suprep good  The instrument was then slowly withdrawn as the colon was fully examined.      COLON FINDINGS: There was severe diverticulosis noted in the left colon with associated muscular hypertrophy.   There was mild scattered diverticulosis noted in the right colon.   The mucosa appeared normal in the terminal ileum.   The colon mucosa was otherwise normal.  Retroflexed views revealed no abnormalities. The time to cecum=3 minutes 0 seconds.  Withdrawal time=10 minutes 0 seconds.  The scope was withdrawn and the procedure completed. COMPLICATIONS: There were no complications.  ENDOSCOPIC IMPRESSION: 1.   There was severe diverticulosis noted in the left colon 2.   There was mild diverticulosis noted in the left colon 3.   Normal mucosa in the terminal ileum 4.   The colon mucosa was otherwise normal  RECOMMENDATIONS: CBC in 1  month from PCP - has had 1 iron transfusion - may need more See GI as needed AICD replacement soon  ok    eSigned:  Iva Boop, MD, [facility] 01/08/2012 10:20 AM   cc: Juleen China, MD and Marinus Maw, MD

## 2012-01-08 NOTE — Discharge Summary (Signed)
[facility] Discharge Summary  Name: [name] MRN: [id number] DOB: September 11, 1945 66 y.o. PCP:  Hannah Beat, MD  Date of Admission: 01/07/2012  1:36 PM Date of Discharge: 01/08/2012 Attending Physician: Iva Boop, MD  Discharge Diagnosis:  IDA  Consultations:   None  Procedures Performed:  Ct Abdomen Pelvis W Contrast  12/11/2011  *RADIOLOGY REPORT*  Clinical Data: Rectal bleeding today  CT ABDOMEN AND PELVIS WITH CONTRAST  Technique:  Multidetector CT imaging of the abdomen and pelvis was performed following the standard protocol during bolus administration of intravenous contrast.  Contrast: 80mL OMNIPAQUE IOHEXOL 300 MG/ML  SOLN  Comparison: CT urogram of 06/19/2010  Findings: Linear scarring is noted at both lung bases posteriorly. Moderate cardiomegaly is present with pacer leads noted.  The liver enhances with no focal abnormality and no ductal dilatation is seen.  Surgical clips are present from prior cholecystectomy.  The pancreas is normal in size and the pancreatic duct is not dilated. The adrenal glands and spleen are unremarkable.  The stomach is decompressed and cannot be evaluated.  Low attenuation bilateral renal lesions are noted most consistent with cysts.  Delayed images show normal pelvocaliceal systems bilaterally.  The proximal ureters are normal in caliber.  Moderate atheromatous change is noted within the abdominal aorta.  The mesenteric vasculature appears patent.  There are multiple colonic diverticula scattered diffusely, but concentrated primarily in the rectosigmoid colon where there is significant diverticulosis present.  No definite colonic mass is seen and no diverticulitis is evident.  The only questionable abnormality is fullness in the base of the cecum posteriorly.  This may represent retained fecal material, but a soft tissue mass cannot be excluded at that site.  The appendix is well visualized and fills with air normally.  The terminal  ileum is well opacified with oral contrast and is unremarkable.  The urinary bladder is moderately urine distended with no abnormality noted and the prostate is only slightly prominent.  No bony abnormality is seen.  IMPRESSION:  1.  There are diffuse colonic diverticula, concentrated primarily within the rectosigmoid colon where there is significant diverticulosis present. 2.  Probable fecal material in the base of the cecum but a soft tissue mass at that site cannot be excluded. 3.  The appendix and terminal ileum appear normal.   Original Report Authenticated By: Dwyane Dee, M.D.     GI Procedures: Colonoscopy on 12/4 revealed only severe diverticulosis in the left colon.  History/Physical Exam:  See Admission H&P  Admission HPI: Patient was admitted to [facility] hospital on 12/3 in order to undergo preparation for colonoscopy on 12/4.  This procedure was being performed due to an episode of GIB that he had in November, as well as an abnormal CT scan showing a possible mass in the cecum and anemia.  He received iron infusion earlier in the day prior to admission.  Hgb was checked and found to be 9.6 grams, which was improved from 1 week ago.  He was started on appropriate home medications and a clear liquid diet.  Was given his bowel preparation as directed and underwent colonoscopy on 12/4.  He was found to have only severe diverticulosis in the left colon.  He felt well and was tolerating a regular diet at the time of discharge.  Was directed to follow-up with GI as needed.  He is to see his PCP in a month.  Will need to have a CBC drawn per his PCP at that time with continued  follow-up.   Discharge Vitals:  BP 118/65  Pulse 84  Temp 97.8 F (36.6 C) (Oral)  Resp 21  Ht 5\' 3"  (1.6 m)  Wt 134 lb 9.6 oz (61.054 kg)  BMI 23.84 kg/m2  SpO2 97%  Discharge Labs:  Results for orders placed during the hospital encounter of 01/07/12 (from the past 24 hour(s))  CBC     Status: Abnormal   Collection Time    01/07/12  3:33 PM      Component Value Range   WBC 10.4  4.0 - 10.5 K/uL   RBC 3.72 (*) 4.22 - 5.81 MIL/uL   Hemoglobin 9.6 (*) 13.0 - 17.0 g/dL   HCT 78.2 (*) 95.6 - 21.3 %   MCV 83.9  78.0 - 100.0 fL   MCH 25.8 (*) 26.0 - 34.0 pg   MCHC 30.8  30.0 - 36.0 g/dL   RDW 08.6 (*) 57.8 - 46.9 %   Platelets 300  150 - 400 K/uL    Disposition and follow-up:   Mr.Minas L Mecham was discharged from [facility] in stable condition.    Follow-up Appointments:   He is to follow-up with his PCP with a CBC in one month.    Discharge Orders    Future Orders Please Complete By Expires   Resume previous diet      Call MD for:  temperature >100.5      Activity as tolerated - No restrictions      Call MD for:      Scheduling Instructions:   As directed on endoscopy discharge instructions.      Discharge Medications:   Medication List     As of 01/08/2012 11:53 AM    STOP taking these medications         Na Sulfate-K Sulfate-Mg Sulf Soln      TAKE these medications         albuterol 108 (90 BASE) MCG/ACT inhaler   Commonly known as: PROVENTIL HFA;VENTOLIN HFA   Inhale 2 puffs into the lungs every 6 (six) hours as needed. As needed for shortness of breath.      aspirin 325 MG EC tablet   Take 1 tablet (325 mg total) by mouth daily. Restart aspirin on 11/12 if you have had no further bleeding.      benzonatate 200 MG capsule   Commonly known as: TESSALON   Take 200 mg by mouth 3 (three) times daily as needed. Cough      carvedilol 12.5 MG tablet   Commonly known as: COREG   Take 1 tablet (12.5 mg total) by mouth 2 (two) times daily with a meal.      diazepam 5 MG tablet   Commonly known as: VALIUM   Take 1 tablet (5 mg total) by mouth every 6 (six) hours as needed.      digoxin 0.125 MG tablet   Commonly known as: LANOXIN   Take 1 tablet (0.125 mg total) by mouth daily.      furosemide 20 MG tablet   Commonly known as: LASIX   Take 20 mg by mouth as needed.  fluid      HYDROcodone-acetaminophen 5-500 MG per tablet   Commonly known as: VICODIN   Take 1 tablet by mouth every 6 (six) hours as needed. Pain      loperamide 2 MG capsule   Commonly known as: IMODIUM   Take 2-4 mg by mouth 4 (four) times daily as needed. As needed for  loose stool. 2 tablets at onset and 1 tablet every hour as needed. Do not exceed more than 8 tablets in 24 hours.      metaxalone 800 MG tablet   Commonly known as: SKELAXIN   Take 1 tablet (800 mg total) by mouth as needed.      nitroGLYCERIN 0.4 MG SL tablet   Commonly known as: NITROSTAT   Place 0.4 mg under the tongue every 5 (five) minutes as needed. For cheat pain.      promethazine 25 MG tablet   Commonly known as: PHENERGAN   Take 25 mg by mouth every 6 (six) hours as needed. As needed for nausea/vomiting.      SUDAFED 12 HOUR PO   Take 2 tablets by mouth 2 (two) times daily as needed. As needed for congestion.      traMADol 50 MG tablet   Commonly known as: ULTRAM   Take 50 mg by mouth every 6 (six) hours as needed. For pain.      zolpidem 12.5 MG CR tablet   Commonly known as: AMBIEN CR   Take 12.5 mg by mouth at bedtime as needed. For insomnia.          SignedCristi Loron, Zyan Mirkin D. 01/08/2012, 11:53 AM

## 2012-01-09 ENCOUNTER — Other Ambulatory Visit: Payer: Self-pay | Admitting: Internal Medicine

## 2012-01-09 ENCOUNTER — Encounter (HOSPITAL_COMMUNITY): Payer: Self-pay | Admitting: Internal Medicine

## 2012-01-09 MED ORDER — CHLORHEXIDINE GLUCONATE 4 % EX LIQD
60.0000 mL | Freq: Once | CUTANEOUS | Status: DC
  Filled 2012-01-09: qty 60

## 2012-01-09 MED ORDER — SODIUM CHLORIDE 0.9 % IJ SOLN: 3.0000 mL | Freq: Two times a day (BID) | INTRAMUSCULAR | Status: DC

## 2012-01-09 MED ORDER — SODIUM CHLORIDE 0.9 % IV SOLN: 250.0000 mL | INTRAVENOUS | Status: DC

## 2012-01-09 MED ORDER — VANCOMYCIN HCL IN DEXTROSE 1-5 GM/200ML-% IV SOLN
1000.0000 mg | INTRAVENOUS | Status: AC
  Filled 2012-01-09 (×2): qty 200

## 2012-01-09 MED ORDER — SODIUM CHLORIDE 0.45 % IV SOLN: INTRAVENOUS | Status: DC

## 2012-01-09 MED ORDER — SODIUM CHLORIDE 0.9 % IR SOLN
80.0000 mg | Status: AC
  Filled 2012-01-09: qty 2

## 2012-01-09 MED ORDER — SODIUM CHLORIDE 0.9 % IJ SOLN: 3.0000 mL | INTRAMUSCULAR | Status: DC | PRN

## 2012-01-10 ENCOUNTER — Encounter (HOSPITAL_COMMUNITY): Payer: Self-pay | Admitting: Pharmacy Technician

## 2012-01-10 ENCOUNTER — Ambulatory Visit (HOSPITAL_COMMUNITY)
Admission: RE | Admit: 2012-01-10 | Payer: Federal, State, Local not specified - PPO | Source: Ambulatory Visit | Admitting: Internal Medicine

## 2012-01-10 SURGERY — IMPLANTABLE CARDIOVERTER DEFIBRILLATOR GENERATOR CHANGE
Anesthesia: LOCAL

## 2012-01-10 MED ORDER — MUPIROCIN 2 % EX OINT
TOPICAL_OINTMENT | CUTANEOUS | Status: AC
  Filled 2012-01-10: qty 22

## 2012-01-14 ENCOUNTER — Telehealth: Payer: Self-pay | Admitting: Internal Medicine

## 2012-01-14 ENCOUNTER — Telehealth: Payer: Self-pay | Admitting: Family Medicine

## 2012-01-14 ENCOUNTER — Ambulatory Visit: Payer: Medicare Other | Admitting: Internal Medicine

## 2012-01-14 NOTE — Telephone Encounter (Signed)
lmom for patient to return my call 

## 2012-01-14 NOTE — Telephone Encounter (Signed)
Let me discuss case with you tomorrow.

## 2012-01-14 NOTE — Telephone Encounter (Signed)
[name] called in and wants to switch his PCP from Dr. Patsy Lager to Dr. Sharen Hones. He needs to make an apptmt prior to the end of the month and would like to make it w/Dr. Sharen Hones. Is this ok w/both of you? Thank you.

## 2012-01-14 NOTE — Telephone Encounter (Signed)
Recent complicate course - hospitalization for GI bleed thought diverticular, finally got colonoscopy - negative for cancer, + severe diverticulosis. Will get Dr. Renaye Rakers opinion, ok by me if ok by him.

## 2012-01-14 NOTE — Telephone Encounter (Signed)
plz return call to pt [phone] regarding medical care questions.

## 2012-01-15 ENCOUNTER — Other Ambulatory Visit: Payer: Self-pay | Admitting: *Deleted

## 2012-01-15 ENCOUNTER — Encounter: Payer: Self-pay | Admitting: *Deleted

## 2012-01-15 DIAGNOSIS — I509 Heart failure, unspecified: Secondary | ICD-10-CM

## 2012-01-15 NOTE — Telephone Encounter (Signed)
D/w Dr. Reece Agar and we both agree, I changed in Epic - f/u ok with Dr. Reece Agar

## 2012-01-15 NOTE — Telephone Encounter (Signed)
Pt is set up for 02/07/12  See note

## 2012-01-24 ENCOUNTER — Ambulatory Visit (INDEPENDENT_AMBULATORY_CARE_PROVIDER_SITE_OTHER): Payer: Federal, State, Local not specified - PPO | Admitting: Family Medicine

## 2012-01-24 ENCOUNTER — Encounter: Payer: Self-pay | Admitting: Family Medicine

## 2012-01-24 VITALS — BP 124/78 | HR 60 | Temp 97.7°F | Wt 145.2 lb

## 2012-01-24 DIAGNOSIS — K5790 Diverticulosis of intestine, part unspecified, without perforation or abscess without bleeding: Secondary | ICD-10-CM

## 2012-01-24 DIAGNOSIS — Z23 Encounter for immunization: Secondary | ICD-10-CM

## 2012-01-24 DIAGNOSIS — M129 Arthropathy, unspecified: Secondary | ICD-10-CM | POA: Diagnosis not present

## 2012-01-24 DIAGNOSIS — F419 Anxiety disorder, unspecified: Secondary | ICD-10-CM | POA: Insufficient documentation

## 2012-01-24 DIAGNOSIS — K5730 Diverticulosis of large intestine without perforation or abscess without bleeding: Secondary | ICD-10-CM | POA: Diagnosis not present

## 2012-01-24 DIAGNOSIS — K922 Gastrointestinal hemorrhage, unspecified: Secondary | ICD-10-CM

## 2012-01-24 DIAGNOSIS — G4700 Insomnia, unspecified: Secondary | ICD-10-CM | POA: Diagnosis not present

## 2012-01-24 DIAGNOSIS — F411 Generalized anxiety disorder: Secondary | ICD-10-CM

## 2012-01-24 DIAGNOSIS — M1990 Unspecified osteoarthritis, unspecified site: Secondary | ICD-10-CM | POA: Insufficient documentation

## 2012-01-24 MED ORDER — DIGOXIN 125 MCG PO TABS: 0.1250 mg | ORAL_TABLET | Freq: Every day | ORAL | Status: DC

## 2012-01-24 MED ORDER — METAXALONE 800 MG PO TABS: 800.0000 mg | ORAL_TABLET | Freq: Three times a day (TID) | ORAL | Status: DC | PRN

## 2012-01-24 MED ORDER — DIAZEPAM 5 MG PO TABS: 5.0000 mg | ORAL_TABLET | Freq: Four times a day (QID) | ORAL | Status: DC | PRN

## 2012-01-24 MED ORDER — ZOLPIDEM TARTRATE ER 6.25 MG PO TBCR: 6.2500 mg | EXTENDED_RELEASE_TABLET | Freq: Every evening | ORAL | Status: DC | PRN

## 2012-01-24 MED ORDER — PROMETHAZINE HCL 25 MG PO TABS: 25.0000 mg | ORAL_TABLET | Freq: Four times a day (QID) | ORAL | Status: DC | PRN

## 2012-01-24 MED ORDER — CARVEDILOL 12.5 MG PO TABS: 12.5000 mg | ORAL_TABLET | Freq: Two times a day (BID) | ORAL | Status: DC

## 2012-01-24 MED ORDER — HYDROCODONE-ACETAMINOPHEN 5-500 MG PO TABS: 1.0000 | ORAL_TABLET | Freq: Four times a day (QID) | ORAL | Status: DC | PRN

## 2012-01-24 MED ORDER — TRAMADOL HCL 50 MG PO TABS: 50.0000 mg | ORAL_TABLET | Freq: Four times a day (QID) | ORAL | Status: DC | PRN

## 2012-01-24 MED ORDER — ZOLPIDEM TARTRATE ER 12.5 MG PO TBCR: 12.5000 mg | EXTENDED_RELEASE_TABLET | Freq: Every evening | ORAL | Status: DC | PRN

## 2012-01-24 NOTE — Assessment & Plan Note (Signed)
Discussed use of ambien cr.  Was on 12.5mg  daily, recommended decreased dose to 6.25mg  prn insomnia. Will try lower dose. Discussed negative effects of med, discussed possible increased all cause mortality association of this med.

## 2012-01-24 NOTE — Patient Instructions (Addendum)
meds refilled today. Blood counts to be checked next week. Try to limit hydrocodone, tramadol, ambien, and valium Return in 4-6 months for physical, prior fasting for blood work.

## 2012-01-24 NOTE — Assessment & Plan Note (Signed)
Refilled vicodin skelaxin and tramadol.  Discussed use, not to mix, and not to take with EtOH.  Pt reports understanding of these issues.  Minimizes use of meds.

## 2012-01-24 NOTE — Assessment & Plan Note (Signed)
Uses valium prn anxiety according to pt, states has been prescribed by Dr. Salena Saner in past.  Will refill, discussed minimizing use.

## 2012-01-24 NOTE — Assessment & Plan Note (Signed)
Seems stable from this standpoint. Has CBC scheduled for next week.  Will await these results. Thought diverticular bleed. Trying to avoid constipation.

## 2012-01-24 NOTE — Progress Notes (Signed)
  Subjective:    Patient ID: [name], male    DOB: 05-Jun-1945, 66 y.o.   MRN: [id number]  HPI CC: discuss meds, needs CBC  Transfer of care from Dr. Patsy Lager.  Recent hospitalization for GI bleed, underwent iron infusion and colonsocpy.  DDx was IDA and diverticular bleed.  Found to have L sided severe diverticulosis on colonoscopy.  Seen by Dr. Leone Payor.  Discharge Hgb 9.6, nadir 8.4  States has labwork upcoming next Friday in preparation for upcoming cardiac procedure - ICD generator change.  Will get CBC checked then.  Would like refill of prescriptions.  Has been on these medicines longterm, feels they work well with him.  Does not mix meds together.  No EtOH use.  Otherwise feeling well.  No more bleeding noted.  Denies dizziness, HA, SOB, fatigue.  Smoking 4 cigs/day.  Precontemplative.  Flu shot - today.  Wt Readings from Last 3 Encounters:  01/24/12 145 lb 4 oz (65.885 kg)  01/07/12 134 lb 9.6 oz (61.054 kg)  01/07/12 134 lb 9.6 oz (61.054 kg)    Past Medical History  Diagnosis Date  . ICD (implantable cardiac defibrillator) in place   . CAD (coronary artery disease), autologous vein bypass graft   . Chronic airway obstruction, not elsewhere classified   . Allergic rhinitis, cause unspecified   . HLD (hyperlipidemia)   . Noncompliance   . Diverticulosis     by CT scan  . Pacemaker   . Paroxysmal ventricular tachycardia   . Atrial fibrillation   . Acute myocardial infarction, unspecified site, episode of care unspecified 1998  . Anginal pain 1998  . Acute lower GI bleeding 12/11/2011    "first time" (12/11/2011)  . Congestive heart failure, unspecified     Reports EF of 26%.     Review of Systems Per HPI    Objective:   Physical Exam  Nursing note and vitals reviewed. Constitutional: He appears well-developed and well-nourished. No distress.  HENT:  Head: Normocephalic and atraumatic.  Mouth/Throat: Oropharynx is clear and moist. No oropharyngeal  exudate.  Cardiovascular: Normal rate, regular rhythm, normal heart sounds and intact distal pulses.   No murmur heard. Pulmonary/Chest: Effort normal and breath sounds normal. No respiratory distress. He has no wheezes. He has no rales.  Musculoskeletal: He exhibits no edema.  Skin: Skin is warm and dry. No rash noted.  Psychiatric: He has a normal mood and affect.       Assessment & Plan:

## 2012-01-24 NOTE — Assessment & Plan Note (Signed)
Severe L sided.

## 2012-01-27 ENCOUNTER — Encounter (HOSPITAL_COMMUNITY): Payer: Self-pay

## 2012-01-31 ENCOUNTER — Other Ambulatory Visit (INDEPENDENT_AMBULATORY_CARE_PROVIDER_SITE_OTHER): Payer: Federal, State, Local not specified - PPO

## 2012-01-31 DIAGNOSIS — I509 Heart failure, unspecified: Secondary | ICD-10-CM

## 2012-01-31 LAB — BASIC METABOLIC PANEL
GFR: 91.62 mL/min (ref >60.00)
Potassium: 5.1 mEq/L (ref 3.5–5.1)
Sodium: 140 mEq/L (ref 135–145)

## 2012-01-31 LAB — CBC WITH DIFFERENTIAL/PLATELET
Eosinophils Relative: 2.5 % (ref 0.0–5.0)
HCT: 38.6 % — ABNORMAL LOW (ref 39.0–52.0)
Hemoglobin: 12.2 g/dL — ABNORMAL LOW (ref 13.0–17.0)
Lymphs Abs: 1.7 10*3/uL (ref 0.7–4.0)
Monocytes Relative: 5.5 % (ref 3.0–12.0)
Neutro Abs: 5.3 10*3/uL (ref 1.4–7.7)
RBC: 4.51 Mil/uL (ref 4.22–5.81)
WBC: 7.7 10*3/uL (ref 4.5–10.5)

## 2012-02-06 MED ORDER — VANCOMYCIN HCL IN DEXTROSE 1-5 GM/200ML-% IV SOLN
1000.0000 mg | INTRAVENOUS | Status: DC
  Filled 2012-02-06 (×2): qty 200

## 2012-02-06 MED ORDER — SODIUM CHLORIDE 0.9 % IR SOLN
80.0000 mg | Status: DC
  Filled 2012-02-06: qty 2

## 2012-02-07 ENCOUNTER — Observation Stay (HOSPITAL_COMMUNITY)
Admission: RE | Admit: 2012-02-07 | Discharge: 2012-02-07 | Disposition: A | Discharge status: Home / Self Care | Payer: Medicare Other | Source: Ambulatory Visit | Attending: Internal Medicine | Admitting: Internal Medicine

## 2012-02-07 ENCOUNTER — Encounter (HOSPITAL_COMMUNITY)
Admission: RE | Disposition: A | Discharge status: Home / Self Care | Payer: Self-pay | Source: Ambulatory Visit | Attending: Internal Medicine

## 2012-02-07 DIAGNOSIS — I4891 Unspecified atrial fibrillation: Secondary | ICD-10-CM | POA: Insufficient documentation

## 2012-02-07 DIAGNOSIS — Z4502 Encounter for adjustment and management of automatic implantable cardiac defibrillator: Secondary | ICD-10-CM | POA: Diagnosis not present

## 2012-02-07 DIAGNOSIS — I509 Heart failure, unspecified: Secondary | ICD-10-CM | POA: Diagnosis not present

## 2012-02-07 DIAGNOSIS — I472 Ventricular tachycardia, unspecified: Secondary | ICD-10-CM | POA: Insufficient documentation

## 2012-02-07 DIAGNOSIS — I2589 Other forms of chronic ischemic heart disease: Secondary | ICD-10-CM | POA: Insufficient documentation

## 2012-02-07 DIAGNOSIS — I4729 Other ventricular tachycardia: Secondary | ICD-10-CM | POA: Insufficient documentation

## 2012-02-07 HISTORY — PX: IMPLANTABLE CARDIOVERTER DEFIBRILLATOR GENERATOR CHANGE: SHX5474

## 2012-02-07 LAB — SURGICAL PCR SCREEN: Staphylococcus aureus: NEGATIVE

## 2012-02-07 SURGERY — IMPLANTABLE CARDIOVERTER DEFIBRILLATOR GENERATOR CHANGE
Anesthesia: LOCAL

## 2012-02-07 MED ORDER — MUPIROCIN 2 % EX OINT
TOPICAL_OINTMENT | CUTANEOUS | Status: AC
  Filled 2012-02-07: qty 22

## 2012-02-07 MED ORDER — CHLORHEXIDINE GLUCONATE 4 % EX LIQD
60.0000 mL | Freq: Once | CUTANEOUS | Status: DC
  Filled 2012-02-07: qty 60

## 2012-02-07 MED ORDER — FENTANYL CITRATE 0.05 MG/ML IJ SOLN
INTRAMUSCULAR | Status: AC
Start: 2012-02-07 — End: 2012-02-07
  Filled 2012-02-07: qty 2

## 2012-02-07 MED ORDER — ONDANSETRON HCL 4 MG/2ML IJ SOLN: 4.0000 mg | Freq: Four times a day (QID) | INTRAMUSCULAR | Status: DC | PRN

## 2012-02-07 MED ORDER — SODIUM CHLORIDE 0.45 % IV SOLN
INTRAVENOUS | Status: DC
  Administered 2012-02-07: 09:00:00 via INTRAVENOUS

## 2012-02-07 MED ORDER — SODIUM CHLORIDE 0.9 % IJ SOLN: 3.0000 mL | Freq: Two times a day (BID) | INTRAMUSCULAR | Status: DC

## 2012-02-07 MED ORDER — SODIUM CHLORIDE 0.9 % IV SOLN: 250.0000 mL | INTRAVENOUS | Status: DC

## 2012-02-07 MED ORDER — LIDOCAINE HCL (PF) 1 % IJ SOLN
INTRAMUSCULAR | Status: AC
  Filled 2012-02-07: qty 60

## 2012-02-07 MED ORDER — MIDAZOLAM HCL 5 MG/5ML IJ SOLN
INTRAMUSCULAR | Status: AC
  Filled 2012-02-07: qty 5

## 2012-02-07 MED ORDER — ACETAMINOPHEN 325 MG PO TABS: 325.0000 mg | ORAL_TABLET | ORAL | Status: DC | PRN

## 2012-02-07 MED ORDER — MUPIROCIN 2 % EX OINT
TOPICAL_OINTMENT | Freq: Two times a day (BID) | CUTANEOUS | Status: DC
  Administered 2012-02-07: 09:00:00 via NASAL
  Filled 2012-02-07: qty 22

## 2012-02-07 MED ORDER — SODIUM CHLORIDE 0.9 % IJ SOLN: 3.0000 mL | INTRAMUSCULAR | Status: DC | PRN

## 2012-02-07 NOTE — Op Note (Signed)
[name], [name] NO.:  [id number]  MEDICAL RECORD NO.:  [id number]  LOCATION:  [facility]                         FACILITY:  [facility]  PHYSICIAN:  Doylene Canning. [name], MD    DATE OF BIRTH:  Mar 25, 1945  DATE OF PROCEDURE:  02/07/2012 DATE OF DISCHARGE:                              OPERATIVE REPORT   PROCEDURE PERFORMED:  Removal of previously implanted ICD which reached elective replacement and insertion of a new ICD.  INTRODUCTION:  The patient is a 67 year old man with an ischemic cardiomyopathy and ventricular tachycardia, status post ICD implantation.  He also has atrial fibrillation.  His device has reached elective replacement.  He has been intolerant of Coumadin in the past. He is not anticoagulated.  He is now referred for removal of his old device and insertion of new one.  PROCEDURE:  After informed consent was obtained, the patient was taken to the diagnostic EP lab in a fasting state.  After usual preparation and draping, intravenous fentanyl and midazolam was given for sedation. A 30 mL of lidocaine was infiltrated into the left infraclavicular region.  A 7-cm incision was carried out over this region. Electrocautery was utilized to dissect down to the fascial plane.  The ICD pocket was entered with electrocautery and generator freed up with electrocautery.  The device was explanted.  The leads were evaluated. The R-waves were chronically between 4 and 4-1/2.  The impedance was 500 and the threshold was less than a volt at 0.5 milliseconds.  With these satisfactory parameters, the old device was disconnected from the defibrillation lead and the new Medtronic Evera XT VR single-chamber defibrillator, serial number [id number] was connected to the defibrillation lead and placed back in the subcutaneous pocket.  The pocket was irrigated with antibiotic irrigation.  The incision was closed with 2-0 and 3-0 Vicryl.  Benzoin and Steri-Strips were painted on the  skin.  A pressure dressing was applied.  It should be noted that defibrillation threshold testing was not carried out as the patient did not have an atrial lead, and was not anticoagulated, and there was concern that he would convert with defibrillation threshold testing back to sinus rhythm and be at risk for stroke.     Doylene Canning. [name], MD     GWT/MEDQ  D:  02/07/2012  T:  02/07/2012  Job:  [id number]

## 2012-02-07 NOTE — H&P (Signed)
ELECTROPHYSIOLOGY ADMISSION HISTORY & PHYSICAL  Patient ID: LAYMAN GULLY MRN: [id number], DOB/AGE: 1945/12/21   Date of Admission: 02/07/2012  Primary Physician: Eustaquio Boyden, MD Primary Cardiologist: Lewayne Bunting, MD Reason for Admission: ICD battery at [facility]  History of Present Illness Mr. Karwowski is a pleasant 67 year old gentleman with an ischemic CM s/p ICD implant, paroxysmal VT, CAD s/p CABG, atrial fibrillation and recent GI bleeding who was seen for routine device follow-up December 05, 2011 and his ICD battery was found to be at [facility]. However, at that time, he was undergoing GI work-up for acute lower GI bleeding so his generator change procedure was postponed. He has now completed his GI work-up and presents for ICD generator change. He has no complaints. He denies CP, SOB, palpitations, dizziness, near syncope or syncope. He denies LE swelling, orthopnea or PND. He denies ICD shocks. He denies recent illness, fever or chills.   Past Medical History Past Medical History  Diagnosis Date  . ICD (implantable cardiac defibrillator) in place   . CAD (coronary artery disease), autologous vein bypass graft   . Chronic airway obstruction, not elsewhere classified   . Allergic rhinitis, cause unspecified   . HLD (hyperlipidemia)   . Noncompliance   . Diverticulosis     by CT scan  . Pacemaker   . Paroxysmal ventricular tachycardia   . Atrial fibrillation   . Acute myocardial infarction, unspecified site, episode of care unspecified 1998  . Anginal pain 1998  . Acute lower GI bleeding 12/11/2011    "first time" (12/11/2011)  . Congestive heart failure, unspecified     Reports EF of 26%.   . Arthritis   . Insomnia   . Anxiety     Past Surgical History Past Surgical History  Procedure Date  . Cardiac defibrillator placement 2004  . Tonsillectomy and adenoidectomy ~ 1951  . Cholecystectomy 1/ 2012  . Shoulder arthroscopy w/ rotator cuff repair twice    right (12/11/2011)  .  Coronary angioplasty 1998, ? 2000's  . Partial knee arthroplasty ~ 2000    left  . Insert / replace / remove pacemaker     medtronic  . Colonoscopy 01/08/2012    Procedure: COLONOSCOPY;  Surgeon: Iva Boop, MD;  Location: [facility];  Service: Endoscopy;  Laterality: N/A;     Allergies/Intolerances Allergies  Allergen Reactions  . Ace Inhibitors Other (See Comments) and Cough    muscle pain.   . Codeine Other (See Comments)    "head wants to explode."  . Penicillins Swelling    "started at point of injection; w/in 3 min my upper earm was swollen 3 times normal"    Home Medications Medications Prior to Admission  Medication Sig Dispense Refill  . albuterol (PROVENTIL HFA;VENTOLIN HFA) 108 (90 BASE) MCG/ACT inhaler Inhale 2 puffs into the lungs every 6 (six) hours as needed. As needed for shortness of breath.      [name] aspirin 325 MG EC tablet Take 325 mg by mouth daily.      . benzonatate (TESSALON) 200 MG capsule Take 200 mg by mouth 3 (three) times daily as needed. Cough      . carvedilol (COREG) 12.5 MG tablet Take 1 tablet (12.5 mg total) by mouth 2 (two) times daily with a meal.  180 tablet  3  . diazepam (VALIUM) 5 MG tablet Take 5 mg by mouth every 6 (six) hours as needed. Spasms/anxiety      . digoxin (LANOXIN) 0.125 MG  tablet Take 1 tablet (0.125 mg total) by mouth daily.  30 tablet  6  . furosemide (LASIX) 20 MG tablet Take 20 mg by mouth as needed. fluid      . HYDROcodone-acetaminophen (VICODIN) 5-500 MG per tablet Take 1 tablet by mouth every 6 (six) hours as needed. Pain  60 tablet  0  . losartan (COZAAR) 50 MG tablet Take 50 mg by mouth daily.      . metaxalone (SKELAXIN) 800 MG tablet Take 1 tablet (800 mg total) by mouth 3 (three) times daily as needed for pain (muscle tightness).  90 tablet  3  . nitroGLYCERIN (NITROSTAT) 0.4 MG SL tablet Place 0.4 mg under the tongue every 5 (five) minutes as needed. For cheat pain.      . promethazine (PHENERGAN) 25 MG tablet  Take 1 tablet (25 mg total) by mouth every 6 (six) hours as needed. As needed for nausea/vomiting.  30 tablet  0  . Pseudoephedrine HCl (SUDAFED 12 HOUR PO) Take 2 tablets by mouth 2 (two) times daily as needed. As needed for congestion.      . traMADol (ULTRAM) 50 MG tablet Take 1 tablet (50 mg total) by mouth every 6 (six) hours as needed. For pain.  60 tablet  3  . zolpidem (AMBIEN CR) 12.5 MG CR tablet Take 12.5 mg by mouth at bedtime as needed. sleep        Family History Positive for CAD and CVA   Social History Social History  . Marital Status: Married   Occupational History  . [employer] truck driver (retired)    Social History Main Topics  . Smoking status: Current Every Day Smoker -- 0.2 packs/day for 50 years    Types: Cigarettes, Cigars  . Smokeless tobacco: Never Used  . Alcohol Use: No  . Drug Use: No   Review of Systems General: No chills, fever, night sweats or weight changes.  Cardiovascular: No chest pain, dyspnea on exertion, edema, orthopnea, palpitations, paroxysmal nocturnal dyspnea. Dermatological: No rash, lesions or masses. Respiratory: No cough, dyspnea. Urologic: No hematuria, dysuria. Abdominal: No nausea, vomiting, diarrhea, bright red blood per rectum, melena, or hematemesis. Neurologic: No visual changes, weakness, changes in mental status. All other systems reviewed and are otherwise negative except as noted above.  Physical Exam Blood pressure 152/89, pulse 71, temperature 97.5 F (36.4 C), temperature source Oral, resp. rate 18, height 5\' 3"  (1.6 m), weight 136 lb (61.689 kg), SpO2 98.00%.  General: Well developed, well appearing 67 year old male in no acute distress. HEENT: Normocephalic, atraumatic. EOMs intact. Sclera nonicteric. Oropharynx clear.  Neck: Supple without bruits. No JVD. Lungs: Respirations regular and unlabored, CTA bilaterally. No wheezes, rales or rhonchi. Heart: RRR. S1, S2 present. No murmurs, rub, S3 or S4. Abdomen: Soft,  non-tender, non-distended. BS present x 4 quadrants. No hepatosplenomegaly.  Extremities: No clubbing, cyanosis or edema. DP/PT/Radials 2+ and equal bilaterally. Psych: Normal affect. Neuro: Alert and oriented X 3. Moves all extremities spontaneously. Musculoskeletal: No kyphosis. Skin: Intact. Warm and dry. No rashes or petechiae in exposed areas.   Labs Lab Results  Component Value Date   WBC 7.7 01/31/2012   HGB 12.2* 01/31/2012   HCT 38.6* 01/31/2012   MCV 85.4 01/31/2012   PLT 284.0 01/31/2012    Lab 01/31/12 1325  NA 140  K 5.1  CL 106  CO2 27  BUN 14  CREATININE 0.9  CALCIUM 9.0  PROT --  BILITOT --  ALKPHOS --  ALT --  AST --  GLUCOSE 105*    Radiology/Studies No results found.    Assessment and Plan 1. ICD battery at [facility] 2. Ischemic CM with chronic systolic HF - stable, euvolemic by exam 3. CAD - stable without anginal symptoms 4. Recent acute lower GI bleed, anemia - Hgb/Hct improved; per GI, stable for ICD generator change Mr. Chenier ICD battery is at [facility]. The indication for ICD generator change was reviewed with Mr. Siek and his wife. The procedure was reviewed in detail, including risks and benefits. These risks include but are not limited to bleeding, infection or lead dislodgement. Mr. Marcil and his wife expressed verbal understanding and agree to proceed.     Signed, Rick Duff, PA-C 02/07/2012, 9:11 AM  EP attending  Patient seen and examined. Agree with the above history, physical exam, assessment and plan. For ICD generator removal and insertion of a new ICD generator as his current device is at [facility].  Leonia Reeves.D.

## 2012-02-07 NOTE — Op Note (Signed)
EP procedure note  Procedure: ICD removal and insertion of a new ICD without immediate complication.Z#[id number].

## 2012-02-07 NOTE — Interval H&P Note (Signed)
History and Physical Interval Note:  02/07/2012 10:23 AM  [name]  has presented today for surgery, with the diagnosis of End of life  The various methods of treatment have been discussed with the patient and family. After consideration of risks, benefits and other options for treatment, the patient has consented to  Procedure(s) (LRB) with comments: IMPLANTABLE CARDIOVERTER DEFIBRILLATOR GENERATOR CHANGE (N/A) as a surgical intervention .  The patient's history has been reviewed, patient examined, no change in status, stable for surgery.  I have reviewed the patient's chart and labs.  Questions were answered to the patient's satisfaction.     Leonia Reeves.D.

## 2012-02-09 ENCOUNTER — Other Ambulatory Visit: Payer: Self-pay | Admitting: Nurse Practitioner

## 2012-02-09 ENCOUNTER — Other Ambulatory Visit: Payer: Self-pay | Admitting: Family Medicine

## 2012-02-10 ENCOUNTER — Telehealth: Payer: Self-pay | Admitting: Internal Medicine

## 2012-02-10 NOTE — Telephone Encounter (Signed)
plz phone in. 

## 2012-02-10 NOTE — Telephone Encounter (Signed)
Rx called in as directed.   

## 2012-02-10 NOTE — Telephone Encounter (Signed)
New problem:   C/O Defib hurts when he rolls on his left side when he was sleeping.

## 2012-02-13 ENCOUNTER — Encounter: Payer: Medicare Other | Admitting: Internal Medicine

## 2012-02-17 ENCOUNTER — Ambulatory Visit (INDEPENDENT_AMBULATORY_CARE_PROVIDER_SITE_OTHER): Payer: Federal, State, Local not specified - PPO | Admitting: *Deleted

## 2012-02-17 ENCOUNTER — Encounter: Payer: Self-pay | Admitting: Internal Medicine

## 2012-02-17 DIAGNOSIS — I255 Ischemic cardiomyopathy: Secondary | ICD-10-CM

## 2012-02-17 DIAGNOSIS — I2589 Other forms of chronic ischemic heart disease: Secondary | ICD-10-CM

## 2012-02-17 LAB — ICD DEVICE OBSERVATION
RV LEAD AMPLITUDE: 5.6 mv
RV LEAD THRESHOLD: 0.75 V

## 2012-02-17 NOTE — Telephone Encounter (Signed)
Came in for his wound check today [name] checked patient

## 2012-02-17 NOTE — Telephone Encounter (Signed)
lmom for patient to return my call.  Called to see if he is any better with the pain on his left side at device site

## 2012-02-17 NOTE — Progress Notes (Signed)
Wound check-ICD 

## 2012-02-19 NOTE — Discharge Summary (Signed)
ELECTROPHYSIOLOGY DISCHARGE SUMMARY    Patient ID: [name],  MRN: [id number], DOB/AGE: [birth year] 68 y.o.  Admit date: 02/07/2012 Discharge date: 02/19/2012  Primary Care Physician: Eustaquio Boyden, MD Primary Cardiologist/EP: Lewayne Bunting, MD  Primary Discharge Diagnosis:  1. ICD battery at [facility], now s/p ICD generator change  Secondary Discharge Diagnoses:  1. Ischemic CM  2. Chronic systolic HF 3. Atrial fibrillation (not on Coumadin due to intolerance - see Dr. Lubertha Basque notes) 4. Paroxysmal VT 5. CAD 6. Dyslipidemia 7. History of medical noncompliance  Procedures This Admission:  1. Removal of previously implanted ICD which reached ERI and insertion of new ICD. - Medtronic Evera XT VR single-chamber defibrillator, serial number [id number]  History and Hospital Course:  [name] is a 67 year old man with an ischemic cardiomyopathy and ventricular tachycardia, status post ICD implantation. His device has reached ERI. Of note, he also has atrial fibrillation. He has been intolerant of Coumadin in the past. He is not anticoagulated. He presented on 02/07/2012 and underwent ICD generator change without DFT (per operative report - It should be noted that defibrillation threshold testing was not carried out as the patient did not have an atrial lead, and was not anticoagulated, and there was concern that he would convert with defibrillation threshold testing back to sinus rhythm and be at risk for stroke.) [name] tolerated this procedure well without any immediate complication. He remained hemodynamically stable and afebrile. His implant site remained intact without significant bleeding or hematoma. He has been given discharge instructions including wound care and activity restrictions. He will follow-up in 10 days for wound check. There were no changes made to his medications. He has been seen, examined and deemed stable for discharge today by Dr. Lewayne Bunting.    Discharge  Vitals: Blood pressure 112/88, pulse 72, temperature 97.6 F (36.4 C), temperature source Oral, resp. rate 20, height 5\' 3"  (1.6 m), weight 136 lb (61.689 kg), SpO2 98.00%.   Labs: Lab Results  Component Value Date   WBC 7.7 01/31/2012   HGB 12.2* 01/31/2012   HCT 38.6* 01/31/2012   MCV 85.4 01/31/2012   PLT 284.0 01/31/2012   No results found for this basename: NA,K,CL,CO2,BUN,CREATININE,CALCIUM,LABALBU,PROT,BILITOT,ALKPHOS,ALT,AST,GLUCOSE in the last 168 hours   Disposition:  The patient is being discharged in stable condition.  Follow-up: 1. Wound check - [facility] - Monday, 02/17/2012 at 3:30 PM 2. Follow-up with Dr. [name] - [facility] - Tuesday, 03/10/2012 at 3:45 PM  Discharge Medications:    Medication List     As of 02/19/2012  4:09 PM    ASK your doctor about these medications         albuterol 108 (90 BASE) MCG/ACT inhaler   Commonly known as: PROVENTIL HFA;VENTOLIN HFA   Inhale 2 puffs into the lungs every 6 (six) hours as needed. As needed for shortness of breath.      aspirin 325 MG EC tablet   Take 325 mg by mouth daily.      benzonatate 200 MG capsule   Commonly known as: TESSALON   Take 200 mg by mouth 3 (three) times daily as needed. Cough      carvedilol 12.5 MG tablet   Commonly known as: COREG   Take 1 tablet (12.5 mg total) by mouth 2 (two) times daily with a meal.      diazepam 5 MG tablet   Commonly known as: VALIUM   Take 5 mg by mouth every 6 (six) hours as needed. Spasms/anxiety  digoxin 0.125 MG tablet   Commonly known as: LANOXIN   Take 1 tablet (0.125 mg total) by mouth daily.      furosemide 20 MG tablet   Commonly known as: LASIX   Take 20 mg by mouth as needed. fluid      HYDROcodone-acetaminophen 5-500 MG per tablet   Commonly known as: VICODIN   Take 1 tablet by mouth every 6 (six) hours as needed. Pain      losartan 50 MG tablet   Commonly known as: COZAAR   Take 50 mg by mouth daily.      metaxalone  800 MG tablet   Commonly known as: SKELAXIN   Take 1 tablet (800 mg total) by mouth 3 (three) times daily as needed for pain (muscle tightness).      nitroGLYCERIN 0.4 MG SL tablet   Commonly known as: NITROSTAT   Place 0.4 mg under the tongue every 5 (five) minutes as needed. For cheat pain.      promethazine 25 MG tablet   Commonly known as: PHENERGAN   Take 1 tablet (25 mg total) by mouth every 6 (six) hours as needed. As needed for nausea/vomiting.      SUDAFED 12 HOUR PO   Take 2 tablets by mouth 2 (two) times daily as needed. As needed for congestion.      traMADol 50 MG tablet   Commonly known as: ULTRAM   Take 1 tablet (50 mg total) by mouth every 6 (six) hours as needed. For pain.      Duration of Discharge Encounter: Less than 30 minutes including physician time.  Signed, Rick Duff, PA-C 02/19/2012, 4:09 PM

## 2012-03-09 ENCOUNTER — Other Ambulatory Visit: Payer: Self-pay | Admitting: Internal Medicine

## 2012-03-10 ENCOUNTER — Ambulatory Visit (INDEPENDENT_AMBULATORY_CARE_PROVIDER_SITE_OTHER): Payer: Federal, State, Local not specified - PPO | Admitting: Internal Medicine

## 2012-03-10 ENCOUNTER — Encounter: Payer: Self-pay | Admitting: Internal Medicine

## 2012-03-10 VITALS — BP 136/95 | HR 75 | Ht 63.0 in | Wt 143.1 lb

## 2012-03-10 DIAGNOSIS — Z95810 Presence of automatic (implantable) cardiac defibrillator: Secondary | ICD-10-CM

## 2012-03-10 DIAGNOSIS — I4891 Unspecified atrial fibrillation: Secondary | ICD-10-CM

## 2012-03-10 DIAGNOSIS — I2589 Other forms of chronic ischemic heart disease: Secondary | ICD-10-CM

## 2012-03-10 DIAGNOSIS — I255 Ischemic cardiomyopathy: Secondary | ICD-10-CM

## 2012-03-10 LAB — ICD DEVICE OBSERVATION
RV LEAD AMPLITUDE: 6.6 mv
RV LEAD THRESHOLD: 0.75 V
VENTRICULAR PACING ICD: 1.2 pct

## 2012-03-10 MED ORDER — DIGOXIN 125 MCG PO TABS: 0.1250 mg | ORAL_TABLET | Freq: Every day | ORAL | Status: DC

## 2012-03-10 NOTE — Patient Instructions (Addendum)
Your physician wants you to follow-up in:3 months with device clinic and 11 months with Dr Court Joy will receive a reminder letter in the mail two months in advance. If you don't receive a letter, please call our office to schedule the follow-up appointment.

## 2012-03-10 NOTE — Assessment & Plan Note (Signed)
The patient denies anginal symptoms. He will continue his current medical therapy. 

## 2012-03-10 NOTE — Assessment & Plan Note (Signed)
His ventricular rate is fairly well controlled today. He will continue his current medical therapy. He continues to refuse anti-coagulation.

## 2012-03-10 NOTE — Assessment & Plan Note (Signed)
His Medtronic ICD appears to be working normally. We'll plan to recheck in several months.

## 2012-03-10 NOTE — Progress Notes (Signed)
HPI [name] returns today for followup. He is a 67 year old man with an ischemic cardiomyopathy, chronic systolic heart failure, class III, chronic atrial fibrillation, unwilling to take any anticoagulation, ventricular tachycardia, status post ICD implantation. He recently underwent ICD generator change out. Defibrillation threshold testing was not carried out out of concern for his developing normal sinus rhythm with defibrillation threshold testing. He has minimal tenderness over his ICD insertion site. His heart failure symptoms have been well-controlled though she still is short of breath with significant exertion. He denies peripheral edema. Allergies  Allergen Reactions  . Ace Inhibitors Other (See Comments) and Cough    muscle pain.   . Codeine Other (See Comments)    "head wants to explode."  . Penicillins Swelling    "started at point of injection; w/in 3 min my upper earm was swollen 3 times normal"     Current Outpatient Prescriptions  Medication Sig Dispense Refill  . albuterol (PROVENTIL HFA;VENTOLIN HFA) 108 (90 BASE) MCG/ACT inhaler Inhale 2 puffs into the lungs every 6 (six) hours as needed. As needed for shortness of breath.      [name] aspirin 325 MG EC tablet Take 325 mg by mouth daily.      . benzonatate (TESSALON) 200 MG capsule Take 200 mg by mouth 3 (three) times daily as needed. Cough      . carvedilol (COREG) 12.5 MG tablet Take 1 tablet (12.5 mg total) by mouth 2 (two) times daily with a meal.  180 tablet  3  . diazepam (VALIUM) 5 MG tablet Take 5 mg by mouth every 6 (six) hours as needed. Spasms/anxiety      . digoxin (LANOXIN) 0.125 MG tablet Take 1 tablet (0.125 mg total) by mouth daily.  30 tablet  6  . furosemide (LASIX) 20 MG tablet TAKE 1 TABLET BY MOUTH FOR 3 DAYS THEN AS NEEDED  30 tablet  9  . HYDROcodone-acetaminophen (VICODIN) 5-500 MG per tablet Take 1 tablet by mouth every 6 (six) hours as needed. Pain  60 tablet  0  . metaxalone (SKELAXIN) 800 MG tablet  Take 1 tablet (800 mg total) by mouth 3 (three) times daily as needed for pain (muscle tightness).  90 tablet  3  . nitroGLYCERIN (NITROSTAT) 0.4 MG SL tablet Place 0.4 mg under the tongue every 5 (five) minutes as needed. For cheat pain.      . promethazine (PHENERGAN) 25 MG tablet Take 1 tablet (25 mg total) by mouth every 6 (six) hours as needed. As needed for nausea/vomiting.  30 tablet  0  . Pseudoephedrine HCl (SUDAFED 12 HOUR PO) Take 2 tablets by mouth 2 (two) times daily as needed. As needed for congestion.      . traMADol (ULTRAM) 50 MG tablet Take 1 tablet (50 mg total) by mouth every 6 (six) hours as needed. For pain.  60 tablet  3  . zolpidem (AMBIEN CR) 12.5 MG CR tablet TAKE 1 TABLET BY MOUTH AT BEDTIME  30 tablet  5  . losartan (COZAAR) 50 MG tablet Take 50 mg by mouth daily.       No current facility-administered medications for this visit.   Facility-Administered Medications Ordered in Other Visits  Medication Dose Route Frequency Provider Last Rate Last Dose  . 0.45 % sodium chloride infusion   Intravenous Continuous Marinus Maw, MD      . 0.9 %  sodium chloride infusion  250 mL Intravenous Continuous Marinus Maw, MD      .  chlorhexidine (HIBICLENS) 4 % liquid 4 application  60 mL Topical Once Marinus Maw, MD      . sodium chloride 0.9 % injection 3 mL  3 mL Intravenous Q12H Marinus Maw, MD      . sodium chloride 0.9 % injection 3 mL  3 mL Intravenous PRN Marinus Maw, MD         Past Medical History  Diagnosis Date  . ICD (implantable cardiac defibrillator) in place   . CAD (coronary artery disease), autologous vein bypass graft   . Chronic airway obstruction, not elsewhere classified   . Allergic rhinitis, cause unspecified   . HLD (hyperlipidemia)   . Noncompliance   . Diverticulosis     by CT scan  . Pacemaker   . Paroxysmal ventricular tachycardia   . Atrial fibrillation   . Acute myocardial infarction, unspecified site, episode of care  unspecified 1998  . Anginal pain 1998  . Acute lower GI bleeding 12/11/2011    "first time" (12/11/2011)  . Congestive heart failure, unspecified     Reports EF of 26%.   . Arthritis   . Insomnia   . Anxiety     ROS:   All systems reviewed and negative except as noted in the HPI.   Past Surgical History  Procedure Date  . Cardiac defibrillator placement 2004  . Tonsillectomy and adenoidectomy ~ 1951  . Cholecystectomy 1/ 2012  . Shoulder arthroscopy w/ rotator cuff repair twice    right (12/11/2011)  . Coronary angioplasty 1998, ? 2000's  . Partial knee arthroplasty ~ 2000    left  . Insert / replace / remove pacemaker     medtronic  . Colonoscopy 01/08/2012    Procedure: COLONOSCOPY;  Surgeon: Iva Boop, MD;  Location: [facility];  Service: Endoscopy;  Laterality: N/A;     Family History  Problem Relation Age of Onset  . Diabetes Father   . Stroke Mother   . Cancer Father     ?     History   Social History  . Marital Status: Married    Spouse Name: N/A    Number of Children: 0  . Years of Education: N/A   Occupational History  . 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 Smoking status: Current Every Day Smoker -- 0.2 packs/day for 50 years    Types: Cigarettes, Cigars  . Smokeless tobacco: Never Used  . Alcohol Use: No  . Drug Use: No  . Sexually Active: No   Other Topics Concern  . Not on file   Social History Narrative  . No narrative on file     BP 136/95  Pulse 75  Ht 5\' 3"  (1.6 m)  Wt 143 lb 1.9 oz (64.919 kg)  BMI 25.35 kg/m2  Physical Exam:  Chronically ill appearing NAD HEENT: Unremarkable Neck:  8 cm JVD, no thyromegally Lungs:  Clear except for rales in the bases. No wheezes or rhonchi. Well-healed ICD incision. HEART:  IRegular rate rhythm, no murmurs, no rubs, no clicks Abd:  soft, positive bowel sounds, no organomegally, no rebound, no guarding Ext:  2 plus pulses, no edema, no cyanosis, no  clubbing Skin:  No rashes no nodules Neuro:  CN II through XII intact, motor grossly intact  EKG atrial fibrillation with right bundle branch block  DEVICE  Normal device function.  See PaceArt for details.   Assess/Plan:

## 2012-04-07 ENCOUNTER — Other Ambulatory Visit: Payer: Self-pay | Admitting: Family Medicine

## 2012-04-07 NOTE — Telephone Encounter (Signed)
Ok to refill 

## 2012-04-08 NOTE — Telephone Encounter (Signed)
Rx called in as directed.   

## 2012-04-08 NOTE — Telephone Encounter (Signed)
plz phone in. 

## 2012-04-21 ENCOUNTER — Other Ambulatory Visit: Payer: Self-pay | Admitting: Family Medicine

## 2012-05-04 ENCOUNTER — Other Ambulatory Visit: Payer: Self-pay | Admitting: Family Medicine

## 2012-05-04 MED ORDER — HYDROCODONE-ACETAMINOPHEN 5-325 MG PO TABS: 1.0000 | ORAL_TABLET | Freq: Four times a day (QID) | ORAL | Status: DC | PRN

## 2012-05-04 NOTE — Telephone Encounter (Signed)
plz phone in. 

## 2012-05-04 NOTE — Telephone Encounter (Signed)
Spoke to pharmacist and was advised that they don't make this dose any longer. Please advise.

## 2012-05-04 NOTE — Telephone Encounter (Signed)
plz phone in new script.

## 2012-05-04 NOTE — Telephone Encounter (Signed)
Rx called to pharmacy

## 2012-05-05 ENCOUNTER — Other Ambulatory Visit: Payer: Self-pay | Admitting: Family Medicine

## 2012-05-06 ENCOUNTER — Other Ambulatory Visit: Payer: Self-pay | Admitting: Family Medicine

## 2012-05-07 NOTE — Telephone Encounter (Signed)
plz phone in. 

## 2012-05-07 NOTE — Telephone Encounter (Signed)
Rx called in as directed.   

## 2012-05-11 ENCOUNTER — Telehealth: Payer: Self-pay

## 2012-05-11 NOTE — Telephone Encounter (Signed)
Filled and placed in my out box. 

## 2012-05-11 NOTE — Telephone Encounter (Signed)
Zolpidem requires prior auth;form in Dr Timoteo Expose in box.

## 2012-05-13 NOTE — Telephone Encounter (Signed)
Received fax from [facility] PA dept; pt is enrolled in [employer] employee prescription program and request was sent to [facility] FEP prior auth division; awaiting decision.

## 2012-05-20 ENCOUNTER — Other Ambulatory Visit: Payer: Self-pay | Admitting: Family Medicine

## 2012-05-29 NOTE — Telephone Encounter (Signed)
Pt left v/m requesting status of PA; spoke with Rozell Searing at [facility] [phone] was approved over phone 03/31/12 -05/29/13. No case # available but approval letter to follow; spoke with pharmacist at [facility] and rx went thru; pt notified via cell v/m.

## 2012-06-18 ENCOUNTER — Other Ambulatory Visit: Payer: Medicare Other

## 2012-06-20 ENCOUNTER — Other Ambulatory Visit: Payer: Self-pay | Admitting: Family Medicine

## 2012-06-21 NOTE — Telephone Encounter (Signed)
plz phone in. 

## 2012-06-22 ENCOUNTER — Other Ambulatory Visit: Payer: Self-pay | Admitting: *Deleted

## 2012-06-22 MED ORDER — LISINOPRIL 10 MG PO TABS: 10.0000 mg | ORAL_TABLET | Freq: Every day | ORAL | Status: DC

## 2012-06-22 NOTE — Telephone Encounter (Signed)
Rx called in as directed.   

## 2012-06-24 ENCOUNTER — Telehealth: Payer: Self-pay

## 2012-06-24 ENCOUNTER — Encounter: Payer: Self-pay | Admitting: Radiology

## 2012-06-24 NOTE — Telephone Encounter (Signed)
Pt came by office today to get urine container and was told would need to collect urine at our office. Pt has problems urinating on command and has appt 06/25/12 to see Dr Reece Agar. Advised pt no urine test is ordered at this time and pt is not having a any urinary symptoms; advised for wellness exams does not always have a U/A checked but pt will come for appt tomorrow and discuss with Dr Sharen Hones.

## 2012-06-25 ENCOUNTER — Encounter: Payer: Self-pay | Admitting: Family Medicine

## 2012-06-25 ENCOUNTER — Ambulatory Visit (INDEPENDENT_AMBULATORY_CARE_PROVIDER_SITE_OTHER): Payer: Federal, State, Local not specified - PPO | Admitting: Family Medicine

## 2012-06-25 VITALS — BP 118/70 | HR 72 | Temp 97.7°F | Ht 63.0 in | Wt 143.5 lb

## 2012-06-25 DIAGNOSIS — E785 Hyperlipidemia, unspecified: Secondary | ICD-10-CM

## 2012-06-25 DIAGNOSIS — M129 Arthropathy, unspecified: Secondary | ICD-10-CM | POA: Diagnosis not present

## 2012-06-25 DIAGNOSIS — D62 Acute posthemorrhagic anemia: Secondary | ICD-10-CM

## 2012-06-25 DIAGNOSIS — K922 Gastrointestinal hemorrhage, unspecified: Secondary | ICD-10-CM

## 2012-06-25 DIAGNOSIS — Z125 Encounter for screening for malignant neoplasm of prostate: Secondary | ICD-10-CM

## 2012-06-25 DIAGNOSIS — F17200 Nicotine dependence, unspecified, uncomplicated: Secondary | ICD-10-CM | POA: Diagnosis not present

## 2012-06-25 DIAGNOSIS — M1990 Unspecified osteoarthritis, unspecified site: Secondary | ICD-10-CM

## 2012-06-25 DIAGNOSIS — Z23 Encounter for immunization: Secondary | ICD-10-CM | POA: Diagnosis not present

## 2012-06-25 DIAGNOSIS — G4700 Insomnia, unspecified: Secondary | ICD-10-CM

## 2012-06-25 DIAGNOSIS — Z0000 Encounter for general adult medical examination without abnormal findings: Secondary | ICD-10-CM

## 2012-06-25 DIAGNOSIS — Z720 Tobacco use: Secondary | ICD-10-CM

## 2012-06-25 DIAGNOSIS — I4891 Unspecified atrial fibrillation: Secondary | ICD-10-CM

## 2012-06-25 MED ORDER — ZOLPIDEM TARTRATE ER 12.5 MG PO TBCR: EXTENDED_RELEASE_TABLET | ORAL | Status: DC

## 2012-06-25 NOTE — Patient Instructions (Addendum)
Return at your convenience fasting for blood work and we will notify you of results. Bring me ambien 6.25mg  CR's tomorrow and we will give you prescription for 12.5mg  CR.  pneumovax today Good to see you today, call us with questions.

## 2012-06-25 NOTE — Progress Notes (Signed)
Subjective:    Patient ID: [name], male    DOB: 01/31/1946, 67 y.o.   MRN: [id number]  HPI CC: medicare wellness visit, initial  67 yo with h/o CAD with ICD in place, ischemic cardiomyopathy and chronic systolic CHF, chronic afib declines anticoagulation, mild COPD, HLD, and severe diverticulosis s/p lower GI bleed late last year who present for initial medicare wellness visit today.  No AMW in past.  Had 2 brownies today.   Concerns about meds - discussed. Was on 12.5mg  CR ambien which worked well for him.  However since change in dose to 6.25mg  CR, more trouble falling asleep.  Desires to return to previous dose.  Intermittent diarrhea - since gallbladder surgery.  Requests permanent disability placard form filled out - hamstring injury and knee trouble - states can walk 30-40 feet before starts having knee and hamstring pain.  Has seen Dr. Allison Quarry chiropractor in past.  Herniated disc in cervical spine - told not to lift >10 lbs.  Smoking - 4-5 cig/day.  Hearing and vision screens passed today. No falls in last year.  No depression/anhedonia.  Preventative: Colon cancer screening - colonoscopy 01/2012 with severe diverticulosis Leone Payor).  Needing iron transfusion. Prostate cancer screening - states prostate checked by [facility] and normal.  Would like screening done. Flu - 01/2012 Pneumovax - requests today Tetanus - within 5 yrs  zostavax - declines Advanced directives - aware, wants to discuss with lawyer.  No life support if no hope.  Wife is HCPOA.  Medications and allergies reviewed and updated in chart.  Past histories reviewed and updated if relevant as below. Patient Active Problem List   Diagnosis Date Noted  . Arthritis   . Insomnia   . Anxiety   . GI bleed 12/11/2011  . Diverticulosis 12/11/2011  . Acute posthemorrhagic anemia 12/11/2011  . LLQ pain 11/15/2011  . Tobacco abuse 03/06/2011  . Ischemic cardiomyopathy 02/12/2011  . Acute on chronic systolic  heart failure 05/10/2010  . ICD-Medtronic 08/23/2009  . CAD, UNSPECIFIED SITE 10/31/2008  . ATRIAL FIBRILLATION WITH RAPID VENTRICULAR RESPONSE 10/31/2008  . HYPERLIPIDEMIA 08/25/2008  . ALLERGIC RHINITIS 08/25/2008  . COPD, MILD 08/25/2008   Past Medical History  Diagnosis Date  . ICD (implantable cardiac defibrillator) in place   . CAD (coronary artery disease), autologous vein bypass graft   . Chronic airway obstruction, not elsewhere classified   . Allergic rhinitis, cause unspecified   . HLD (hyperlipidemia)   . Noncompliance   . Diverticulosis     by CT scan  . Pacemaker   . Paroxysmal ventricular tachycardia   . Atrial fibrillation   . Acute myocardial infarction, unspecified site, episode of care unspecified 1998  . Anginal pain 1998  . Acute lower GI bleeding 12/11/2011    "first time" (12/11/2011)  . Congestive heart failure, unspecified     Reports EF of 26%.   . Arthritis   . Insomnia   . Anxiety    Past Surgical History  Procedure Laterality Date  . Cardiac defibrillator placement  2004  . Tonsillectomy and adenoidectomy  ~ 1951  . Cholecystectomy  1/ 2012  . Shoulder arthroscopy w/ rotator cuff repair  twice    right (12/11/2011)  . Coronary angioplasty  1998, ? 2000's  . Partial knee arthroplasty  ~ 2000    left  . Insert / replace / remove pacemaker      medtronic  . Colonoscopy  01/08/2012    Procedure: COLONOSCOPY;  Surgeon: Maryjean Morn  Leone Payor, MD;  Location: [facility];  Service: Endoscopy;  Laterality: N/A;  . Cataract extraction Right 01/2012   History  Substance Use Topics  . Smoking status: Current Every Day Smoker -- 0.25 packs/day for 50 years    Types: Cigarettes, Cigars  . Smokeless tobacco: Never Used  . Alcohol Use: No   Family History  Problem Relation Age of Onset  . Diabetes Father   . Stroke Mother   . Cancer Father 25    trachea (smoker)  . Cancer Sister     left eye  . CAD Neg Hx    Allergies  Allergen Reactions  . Ace  Inhibitors Other (See Comments) and Cough    muscle pain.   . Codeine Other (See Comments)    "head wants to explode."  . Penicillins Swelling    "started at point of injection; w/in 3 [name] my upper earm was swollen 3 times normal"   Current Outpatient Prescriptions on File Prior to Visit  Medication Sig Dispense Refill  . albuterol (PROVENTIL HFA;VENTOLIN HFA) 108 (90 BASE) MCG/ACT inhaler Inhale 2 puffs into the lungs every 6 (six) hours as needed. As needed for shortness of breath.      [name] aspirin 325 MG EC tablet Take 325 mg by mouth daily.      . benzonatate (TESSALON) 200 MG capsule TAKE 1 CAPSULE BY MOUTH 3 TIMES DAILY AS NEEDED FOR COUGH  30 capsule  0  . carvedilol (COREG) 12.5 MG tablet Take 1 tablet (12.5 mg total) by mouth 2 (two) times daily with a meal.  180 tablet  3  . diazepam (VALIUM) 5 MG tablet Take 5 mg by mouth every 6 (six) hours as needed. Spasms/anxiety      . digoxin (LANOXIN) 0.125 MG tablet Take 1 tablet (0.125 mg total) by mouth daily.  30 tablet  6  . furosemide (LASIX) 20 MG tablet TAKE 1 TABLET BY MOUTH FOR 3 DAYS THEN AS NEEDED  30 tablet  9  . HYDROcodone-acetaminophen (NORCO/VICODIN) 5-325 MG per tablet Take 1 tablet by mouth every 6 (six) hours as needed for pain.  60 tablet  0  . lisinopril (PRINIVIL,ZESTRIL) 10 MG tablet Take 1 tablet (10 mg total) by mouth daily.  90 tablet  2  . losartan (COZAAR) 50 MG tablet Take 50 mg by mouth daily.      . metaxalone (SKELAXIN) 800 MG tablet Take 1 tablet (800 mg total) by mouth 3 (three) times daily as needed for pain (muscle tightness).  90 tablet  3  . nitroGLYCERIN (NITROSTAT) 0.4 MG SL tablet Place 0.4 mg under the tongue every 5 (five) minutes as needed. For cheat pain.      . promethazine (PHENERGAN) 25 MG tablet TAKE 1 TABLET BY MOUTH EVERY 6 HOURS AS NEEDED FOR NAUSEA/VOMITING  30 tablet  0  . Pseudoephedrine HCl (SUDAFED 12 HOUR PO) Take 2 tablets by mouth 2 (two) times daily as needed. As needed for  congestion.      . traMADol (ULTRAM) 50 MG tablet Take 1 tablet (50 mg total) by mouth every 6 (six) hours as needed. For pain.  60 tablet  3   Current Facility-Administered Medications on File Prior to Visit  Medication Dose Route Frequency Provider Last Rate Last Dose  . 0.45 % sodium chloride infusion   Intravenous Continuous Marinus Maw, MD      . 0.9 %  sodium chloride infusion  250 mL Intravenous Continuous Marinus Maw, MD      .  chlorhexidine (HIBICLENS) 4 % liquid 4 application  60 mL Topical Once Marinus Maw, MD      . sodium chloride 0.9 % injection 3 mL  3 mL Intravenous Q12H Marinus Maw, MD      . sodium chloride 0.9 % injection 3 mL  3 mL Intravenous PRN Marinus Maw, MD         Review of Systems  Constitutional: Negative for fever, chills, activity change, appetite change, fatigue and unexpected weight change.  HENT: Positive for rhinorrhea and postnasal drip. Negative for hearing loss and neck pain.   Eyes: Negative for visual disturbance.  Respiratory: Negative for cough, chest tightness, shortness of breath and wheezing.   Cardiovascular: Negative for chest pain, palpitations and leg swelling.  Gastrointestinal: Positive for diarrhea. Negative for nausea, vomiting, abdominal pain, constipation, blood in stool and abdominal distention.  Genitourinary: Negative for hematuria and difficulty urinating.  Musculoskeletal: Negative for myalgias and arthralgias.  Skin: Negative for rash.  Neurological: Negative for dizziness, seizures, syncope and headaches.  Hematological: Negative for adenopathy. Does not bruise/bleed easily.  Psychiatric/Behavioral: Negative for dysphoric mood. The patient is not nervous/anxious.        Objective:   Physical Exam  Nursing note and vitals reviewed. Constitutional: He is oriented to person, place, and time. He appears well-developed and well-nourished. No distress.  HENT:  Head: Normocephalic and atraumatic.  Right Ear:  External ear normal.  Left Ear: External ear normal.  Nose: Nose normal.  Mouth/Throat: Oropharynx is clear and moist. No oropharyngeal exudate.  Eyes: Conjunctivae and EOM are normal. Pupils are equal, round, and reactive to light. No scleral icterus.  Neck: Normal range of motion. Neck supple. No thyromegaly present.  Cardiovascular: Normal rate, regular rhythm, normal heart sounds and intact distal pulses.   No murmur heard. Pulses:      Radial pulses are 2+ on the right side, and 2+ on the left side.  Pulmonary/Chest: Effort normal and breath sounds normal. No respiratory distress. He has no wheezes. He has no rales.  Abdominal: Soft. Bowel sounds are normal. He exhibits no distension and no mass. There is no tenderness. There is no rebound and no guarding.  Genitourinary:  deferred  Musculoskeletal: Normal range of motion. He exhibits no edema.  Lymphadenopathy:    He has no cervical adenopathy.  Neurological: He is alert and oriented to person, place, and time.  CN grossly intact, station and gait intact  Skin: Skin is warm and dry. No rash noted.  Psychiatric: He has a normal mood and affect. His behavior is normal. Judgment and thought content normal.       Assessment & Plan:

## 2012-06-25 NOTE — Telephone Encounter (Signed)
Noted will discuss today

## 2012-06-26 ENCOUNTER — Other Ambulatory Visit: Payer: Self-pay | Admitting: *Deleted

## 2012-06-26 ENCOUNTER — Other Ambulatory Visit (INDEPENDENT_AMBULATORY_CARE_PROVIDER_SITE_OTHER): Payer: Medicare Other

## 2012-06-26 ENCOUNTER — Encounter: Payer: Self-pay | Admitting: Family Medicine

## 2012-06-26 DIAGNOSIS — Z125 Encounter for screening for malignant neoplasm of prostate: Secondary | ICD-10-CM

## 2012-06-26 DIAGNOSIS — Z0000 Encounter for general adult medical examination without abnormal findings: Secondary | ICD-10-CM | POA: Insufficient documentation

## 2012-06-26 DIAGNOSIS — E785 Hyperlipidemia, unspecified: Secondary | ICD-10-CM

## 2012-06-26 DIAGNOSIS — D62 Acute posthemorrhagic anemia: Secondary | ICD-10-CM | POA: Diagnosis not present

## 2012-06-26 LAB — CBC WITH DIFFERENTIAL/PLATELET
Basophils Absolute: 0.1 10*3/uL (ref 0.0–0.1)
Eosinophils Absolute: 0.4 10*3/uL (ref 0.0–0.7)
HCT: 44 % (ref 39.0–52.0)
Lymphs Abs: 2.4 10*3/uL (ref 0.7–4.0)
Monocytes Absolute: 0.8 10*3/uL (ref 0.1–1.0)
Monocytes Relative: 7.6 % (ref 3.0–12.0)
Platelets: 260 10*3/uL (ref 150.0–400.0)
RDW: 14.9 % — ABNORMAL HIGH (ref 11.5–14.6)

## 2012-06-26 LAB — COMPREHENSIVE METABOLIC PANEL
Albumin: 3.6 g/dL (ref 3.5–5.2)
Alkaline Phosphatase: 71 U/L (ref 39–117)
CO2: 31 mEq/L (ref 19–32)
Glucose, Bld: 113 mg/dL — ABNORMAL HIGH (ref 70–99)
Potassium: 4.6 mEq/L (ref 3.5–5.1)
Sodium: 137 mEq/L (ref 135–145)
Total Protein: 6.4 g/dL (ref 6.0–8.3)

## 2012-06-26 LAB — LIPID PANEL: VLDL: 20.4 mg/dL (ref 0.0–40.0)

## 2012-06-26 LAB — PSA: PSA: 0.89 ng/mL (ref 0.10–4.00)

## 2012-06-26 NOTE — Assessment & Plan Note (Signed)
Chronic, stable.  Refuses anticoagulation, only on aspirin 325mg  daily.

## 2012-06-26 NOTE — Assessment & Plan Note (Signed)
Discussed use of ambien CR - discussed habituation and accumulation risk.  Pt states lower dose does not work effectively for him - so will increase to 12.5mg .

## 2012-06-26 NOTE — Assessment & Plan Note (Signed)
I have personally reviewed the Medicare Annual Wellness questionnaire and have noted 1. The patient's medical and social history 2. Their use of alcohol, tobacco or illicit drugs 3. Their current medications and supplements 4. The patient's functional ability including ADL's, fall risks, home safety risks and hearing or visual impairment. 5. Diet and physical activity 6. Evidence for depression or mood disorders The patients weight, height, BMI have been recorded in the chart.  Hearing and vision has been addressed. I have made referrals, counseling and provided education to the patient based review of the above and I have provided the pt with a written personalized care plan for preventive services. See scanned questionairre. Advanced directives discussed: pt states he is working on filling this out with Clinical research associate.  Would want wife to be HCPOA.  Reviewed preventative protocols and updated unless pt declined. Pneumovax today. States told prostate was normal on recent colonoscopy - declines rectal exam today.  Desires PSA testing.

## 2012-06-26 NOTE — Assessment & Plan Note (Signed)
Will continue meds - I did ask him to update controlled substance agreement. After visit he told my nurse he was unable to produce urine and would return tomorrow at lab visit to provide urine. Will await urine drug screen prior to refilling controlled substances.

## 2012-06-26 NOTE — Telephone Encounter (Signed)
Patient brought in Ambien CR 6.25mg  # 22 for destruction. Mills Koller was witness.

## 2012-06-26 NOTE — Assessment & Plan Note (Signed)
Continue to encourage cessation. 

## 2012-06-26 NOTE — Assessment & Plan Note (Signed)
Chronic, recheck FLP today. Not on statin.

## 2012-06-26 NOTE — Addendum Note (Signed)
Addended by: Baldomero Lamy on: 06/26/2012 09:12 AM   Modules accepted: Orders

## 2012-06-30 ENCOUNTER — Encounter: Payer: Self-pay | Admitting: *Deleted

## 2012-07-01 DIAGNOSIS — Z79899 Other long term (current) drug therapy: Secondary | ICD-10-CM | POA: Diagnosis not present

## 2012-07-01 NOTE — Telephone Encounter (Signed)
Called patient and advised that prescription is at the office waiting for him to pick it up. Patient requested that it be called to the pharmacy because he is out of his medication. Rx called [facility] per patient's request.

## 2012-07-01 NOTE — Telephone Encounter (Signed)
Pt said when brought Ambien CR 6.25 mg tabs on 06/26/12 pt did not get new rx for Ambien CR 12.5 mg. Pt is out of med and request Ambien CR 12.5 mg called to [facility] [place].Please advise.

## 2012-07-07 ENCOUNTER — Other Ambulatory Visit: Payer: Self-pay | Admitting: Family Medicine

## 2012-07-07 NOTE — Telephone Encounter (Signed)
Ok to refill 

## 2012-07-12 ENCOUNTER — Other Ambulatory Visit: Payer: Self-pay | Admitting: Family Medicine

## 2012-07-12 NOTE — Telephone Encounter (Signed)
plz phone in. 

## 2012-07-13 ENCOUNTER — Encounter: Payer: Self-pay | Admitting: Family Medicine

## 2012-07-13 NOTE — Telephone Encounter (Signed)
Rx called in as directed.   

## 2012-07-25 ENCOUNTER — Other Ambulatory Visit: Payer: Self-pay | Admitting: Family Medicine

## 2012-07-25 NOTE — Telephone Encounter (Signed)
plz phone in. 

## 2012-07-27 NOTE — Telephone Encounter (Signed)
Rx called in as directed.   

## 2012-07-28 ENCOUNTER — Telehealth: Payer: Self-pay | Admitting: *Deleted

## 2012-07-28 NOTE — Telephone Encounter (Signed)
PA form for Ambien CR in your IN box for completion.

## 2012-07-28 NOTE — Telephone Encounter (Signed)
Filled out and placed in my out box. 

## 2012-08-04 ENCOUNTER — Other Ambulatory Visit: Payer: Self-pay | Admitting: Internal Medicine

## 2012-08-12 ENCOUNTER — Ambulatory Visit: Payer: Federal, State, Local not specified - PPO | Admitting: Family Medicine

## 2012-08-13 ENCOUNTER — Ambulatory Visit (INDEPENDENT_AMBULATORY_CARE_PROVIDER_SITE_OTHER): Payer: Medicare Other | Admitting: Family Medicine

## 2012-08-13 ENCOUNTER — Encounter: Payer: Self-pay | Admitting: Family Medicine

## 2012-08-13 VITALS — BP 124/84 | HR 58 | Temp 98.0°F | Ht 63.0 in | Wt 145.5 lb

## 2012-08-13 DIAGNOSIS — S76311A Strain of muscle, fascia and tendon of the posterior muscle group at thigh level, right thigh, initial encounter: Secondary | ICD-10-CM

## 2012-08-13 DIAGNOSIS — IMO0002 Reserved for concepts with insufficient information to code with codable children: Secondary | ICD-10-CM | POA: Diagnosis not present

## 2012-08-13 MED ORDER — DICLOFENAC SODIUM 1 % TD GEL: 1.0000 "application " | Freq: Three times a day (TID) | TRANSDERMAL | Status: DC

## 2012-08-13 NOTE — Assessment & Plan Note (Signed)
Ongoing for months - treat with rest, compression, voltaren gel, and continue skelaxin and tramadol/vicodin for pain. Stretching exercises from [facility] pt advisor provided today. Update me if sxs persist or worsen. No baker's cyst appreciated today.  Normal knee exam today.

## 2012-08-13 NOTE — Patient Instructions (Signed)
I think this is a repeat hamstring strain. Treat with compression of thigh (today) as well as voltaren anti inflammatory gel - prescribed today. May continue tramadol and skelaxin as needed, save hydrocodone for breakthrough pain. Do stretching exercised provided today.  If not better, we may consider physical therapy If worsening, let me know.

## 2012-08-13 NOTE — Progress Notes (Signed)
  Subjective:    Patient ID: [name], male    DOB: 03-31-1945, 67 y.o.   MRN: [id number]  HPI CC: R knee pain  01/2012 - resting weight on calves working on christmas tree - after this knee started bothering him.  Gradually worsening.  No other radiation other than hamstrings and anterior knee pain.  Extending knee precipitates pain - also notes crepitus.  Also feels tight in hamstrings. No erythema or warmth or swelling of R knee. Elastic band didn't help.  On tramadol, hydrocodone, skelaxin for chronic arthritis pain.  These medicines help pain.  H/o pulled hamstring in 2002/2003.    No other joint issues currently.  Past Medical History  Diagnosis Date  . ICD (implantable cardiac defibrillator) in place   . CAD (coronary artery disease), autologous vein bypass graft   . Chronic airway obstruction, not elsewhere classified   . Allergic rhinitis, cause unspecified   . HLD (hyperlipidemia)   . Noncompliance   . Diverticulosis     by CT scan  . Pacemaker   . Paroxysmal ventricular tachycardia   . Atrial fibrillation   . Acute myocardial infarction, unspecified site, episode of care unspecified 1998  . Anginal pain 1998  . Acute lower GI bleeding 12/11/2011    "first time" (12/11/2011)  . Congestive heart failure, unspecified     Reports EF of 26%.   . Arthritis     knees, back  . Insomnia   . Anxiety   . Cervical herniated disc     told not to lift >10 lbs    Review of Systems Per HPI    Objective:   Physical Exam  Nursing note and vitals reviewed. Constitutional: He appears well-developed and well-nourished. No distress.  Musculoskeletal: He exhibits no edema.  FROM of bilateral knees.  Tender to palpation medial hamstring as well as popliteal region. No crepitus noted. No deformity, edema or erythema. Bilaterally symmetrical patellar mobility R knee:  Neg drawer test, neg mcmurray's test, no PF grind, no pain/laxity with valgus/varus testing. Pain with  testing of hamstring strength  Skin: Skin is warm and dry. No rash noted.       Assessment & Plan:

## 2012-08-16 ENCOUNTER — Other Ambulatory Visit: Payer: Self-pay | Admitting: Family Medicine

## 2012-08-16 NOTE — Telephone Encounter (Signed)
plz phone in. 

## 2012-08-17 NOTE — Telephone Encounter (Signed)
Rx called in as directed.   

## 2012-08-31 ENCOUNTER — Other Ambulatory Visit: Payer: Self-pay | Admitting: Family Medicine

## 2012-08-31 NOTE — Telephone Encounter (Signed)
Rx called in as directed.   

## 2012-08-31 NOTE — Telephone Encounter (Signed)
plz phone in. 

## 2012-09-22 ENCOUNTER — Other Ambulatory Visit: Payer: Self-pay | Admitting: Family Medicine

## 2012-09-22 NOTE — Telephone Encounter (Signed)
plz phone in. 

## 2012-09-22 NOTE — Telephone Encounter (Signed)
Ok to refill 

## 2012-09-23 NOTE — Telephone Encounter (Signed)
Rx's called in as directed.  

## 2012-10-07 ENCOUNTER — Other Ambulatory Visit: Payer: Self-pay | Admitting: Family Medicine

## 2012-10-07 NOTE — Telephone Encounter (Signed)
Ok to refill 

## 2012-10-27 ENCOUNTER — Other Ambulatory Visit: Payer: Self-pay | Admitting: Family Medicine

## 2012-10-27 NOTE — Telephone Encounter (Signed)
Rx called in as directed.   

## 2012-10-27 NOTE — Telephone Encounter (Signed)
Ok to refill in Dr. Timoteo Expose absence? Last filled 09/22/12.

## 2012-11-04 DIAGNOSIS — J449 Chronic obstructive pulmonary disease, unspecified: Secondary | ICD-10-CM

## 2012-11-04 HISTORY — DX: Chronic obstructive pulmonary disease, unspecified: J44.9

## 2012-11-05 ENCOUNTER — Encounter: Payer: Self-pay | Admitting: Family Medicine

## 2012-11-05 ENCOUNTER — Ambulatory Visit (INDEPENDENT_AMBULATORY_CARE_PROVIDER_SITE_OTHER): Payer: Medicare Other | Admitting: Family Medicine

## 2012-11-05 VITALS — BP 114/78 | HR 68 | Temp 97.6°F | Wt 141.8 lb

## 2012-11-05 DIAGNOSIS — F17200 Nicotine dependence, unspecified, uncomplicated: Secondary | ICD-10-CM | POA: Diagnosis not present

## 2012-11-05 DIAGNOSIS — Z23 Encounter for immunization: Secondary | ICD-10-CM

## 2012-11-05 DIAGNOSIS — R222 Localized swelling, mass and lump, trunk: Secondary | ICD-10-CM | POA: Diagnosis not present

## 2012-11-05 DIAGNOSIS — Z720 Tobacco use: Secondary | ICD-10-CM

## 2012-11-05 MED ORDER — HYDROCODONE-ACETAMINOPHEN 5-325 MG PO TABS: ORAL_TABLET | ORAL | Status: DC

## 2012-11-05 MED ORDER — NITROGLYCERIN 0.4 MG SL SUBL: 0.4000 mg | SUBLINGUAL_TABLET | SUBLINGUAL | Status: DC | PRN

## 2012-11-05 NOTE — Patient Instructions (Addendum)
I think this is a benign bony growth or cyst.  Watch for enlargement or worsening pain - if this happens I recommend xray. May use voltaren gel to area as needed. Let me know if not improving as expected. Flu shot today.

## 2012-11-05 NOTE — Assessment & Plan Note (Signed)
Continue to encourage cessation. 

## 2012-11-05 NOTE — Assessment & Plan Note (Signed)
Anticipate benign bony cyst or ganglion cyst of right mid costochondral juncture. Red flags to return or to merit xray discussed. Pt agrees with plan. May use voltaren gel prn.

## 2012-11-05 NOTE — Progress Notes (Signed)
  Subjective:    Patient ID: [name], male    DOB: 08/31/1945, 67 y.o.   MRN: [id number]  HPI CC: check knot on chest  Tender spot on left sternum noted for the last several months, recently started hurting last week.  No redness or warmth.  No falls or injury to chest. Persistent smoker 1/4 ppd. Wt Readings from Last 3 Encounters:  11/05/12 141 lb 12 oz (64.297 kg)  08/13/12 145 lb 8 oz (65.998 kg)  06/25/12 143 lb 8 oz (65.091 kg)    Past Medical History  Diagnosis Date  . ICD (implantable cardiac defibrillator) in place   . CAD (coronary artery disease), autologous vein bypass graft   . Chronic airway obstruction, not elsewhere classified   . Allergic rhinitis, cause unspecified   . HLD (hyperlipidemia)   . Noncompliance   . Diverticulosis     by CT scan  . Pacemaker   . Paroxysmal ventricular tachycardia   . Atrial fibrillation   . Acute myocardial infarction, unspecified site, episode of care unspecified 1998  . Anginal pain 1998  . Acute lower GI bleeding 12/11/2011    "first time" (12/11/2011)  . Congestive heart failure, unspecified     Reports EF of 26%.   . Arthritis     knees, back  . Insomnia   . Anxiety   . Cervical herniated disc     told not to lift >10 lbs    Family History  Problem Relation Age of Onset  . Diabetes Father   . Stroke Mother   . Cancer Father 45    trachea (smoker)  . Cancer Sister     left eye  . CAD Neg Hx     Review of Systems Per HPI    Objective:   Physical Exam  Nursing note and vitals reviewed. Constitutional: He appears well-developed and well-nourished. No distress.  Pulmonary/Chest:    Musculoskeletal:  Slight prominence of mid sternum on right side and hard nodule noted   Skin: Skin is warm and dry. No erythema.  Few papules on anterior chest       Assessment & Plan:

## 2012-11-07 ENCOUNTER — Other Ambulatory Visit: Payer: Self-pay | Admitting: Family Medicine

## 2012-11-09 NOTE — Telephone Encounter (Signed)
Ok to refill 

## 2012-11-09 NOTE — Telephone Encounter (Signed)
Written script given to patient for #60 on 11/05/2012 at office visit.

## 2012-11-09 NOTE — Telephone Encounter (Signed)
Pt left v/m for hydrocodone refill; spoke with pt and he is presently fishing but when he gets home he will look at paperwork given on 11/05/12 visit for prescription. If pt cannot find prescription he will call our office back.

## 2012-11-12 ENCOUNTER — Encounter: Payer: Self-pay | Admitting: Family Medicine

## 2012-11-12 ENCOUNTER — Ambulatory Visit (INDEPENDENT_AMBULATORY_CARE_PROVIDER_SITE_OTHER): Payer: Medicare Other | Admitting: Family Medicine

## 2012-11-12 ENCOUNTER — Ambulatory Visit (INDEPENDENT_AMBULATORY_CARE_PROVIDER_SITE_OTHER)
Admission: RE | Admit: 2012-11-12 | Discharge: 2012-11-12 | Disposition: A | Discharge status: Home / Self Care | Payer: Medicare Other | Source: Ambulatory Visit | Attending: Family Medicine | Admitting: Family Medicine

## 2012-11-12 VITALS — BP 118/86 | HR 80 | Temp 98.1°F | Wt 141.5 lb

## 2012-11-12 DIAGNOSIS — R222 Localized swelling, mass and lump, trunk: Secondary | ICD-10-CM

## 2012-11-12 DIAGNOSIS — I509 Heart failure, unspecified: Secondary | ICD-10-CM

## 2012-11-12 DIAGNOSIS — M899 Disorder of bone, unspecified: Secondary | ICD-10-CM | POA: Diagnosis not present

## 2012-11-12 DIAGNOSIS — I5022 Chronic systolic (congestive) heart failure: Secondary | ICD-10-CM | POA: Diagnosis not present

## 2012-11-12 NOTE — Patient Instructions (Signed)
Xray today we will call you with results

## 2012-11-12 NOTE — Progress Notes (Signed)
  Subjective:    Patient ID: [name], male    DOB: 06/23/1945, 67 y.o.   MRN: [id number]  HPI CC: f/u bony growth on sternum  Seen here last week with bony growth on sternum, though bone cyst or ganglio cyst of sternum.   Last visit recommended monitor and treat with voltaren gel, and return if enlarging or worsening.    Present for the last several months. No redness or warmth.   No falls or injury to chest.  Persistent smoker 1/4 ppd.  Past Medical History  Diagnosis Date  . ICD (implantable cardiac defibrillator) in place   . CAD (coronary artery disease), autologous vein bypass graft   . Chronic airway obstruction, not elsewhere classified   . Allergic rhinitis, cause unspecified   . HLD (hyperlipidemia)   . Noncompliance   . Diverticulosis     by CT scan  . Pacemaker   . Paroxysmal ventricular tachycardia   . Atrial fibrillation   . Acute myocardial infarction, unspecified site, episode of care unspecified 1998  . Anginal pain 1998  . Acute lower GI bleeding 12/11/2011    "first time" (12/11/2011)  . Congestive heart failure, unspecified     Reports EF of 26%.   . Arthritis     knees, back  . Insomnia   . Anxiety   . Cervical herniated disc     told not to lift >10 lbs     Review of Systems Per HPI    Objective:   Physical Exam  Nursing note and vitals reviewed. Constitutional: He appears well-developed and well-nourished. No distress.  Pulmonary/Chest:    Right sided cyst intermittently present, tender to palpation.       Assessment & Plan:

## 2012-11-12 NOTE — Assessment & Plan Note (Signed)
Persistent discomfort.  Check sternum xray today. If persistent irritation, consider SM referral for further evaluation. ?ganglion cyst vs bony cyst. Anticipate benign etiology -discussed as much.

## 2012-11-13 ENCOUNTER — Ambulatory Visit: Payer: Medicare Other | Admitting: Family Medicine

## 2012-11-15 ENCOUNTER — Encounter: Payer: Self-pay | Admitting: Family Medicine

## 2012-11-27 ENCOUNTER — Other Ambulatory Visit: Payer: Self-pay | Admitting: Family Medicine

## 2012-11-27 NOTE — Telephone Encounter (Signed)
Rx called in as directed.   

## 2012-11-27 NOTE — Telephone Encounter (Signed)
Ok to refill 

## 2012-11-27 NOTE — Telephone Encounter (Signed)
plz phone in. 

## 2012-12-01 ENCOUNTER — Other Ambulatory Visit: Payer: Self-pay | Admitting: Family Medicine

## 2012-12-01 MED ORDER — TRAMADOL HCL 50 MG PO TABS: ORAL_TABLET | ORAL | Status: DC

## 2012-12-01 NOTE — Telephone Encounter (Signed)
Rx called in as directed.   

## 2012-12-01 NOTE — Telephone Encounter (Signed)
Ok to refill 

## 2012-12-01 NOTE — Telephone Encounter (Signed)
plz phone in. 

## 2012-12-04 ENCOUNTER — Encounter: Payer: Self-pay | Admitting: Family Medicine

## 2012-12-04 ENCOUNTER — Ambulatory Visit (INDEPENDENT_AMBULATORY_CARE_PROVIDER_SITE_OTHER): Payer: Medicare Other | Admitting: Family Medicine

## 2012-12-04 VITALS — BP 118/64 | HR 68 | Temp 97.6°F | Wt 140.5 lb

## 2012-12-04 DIAGNOSIS — R300 Dysuria: Secondary | ICD-10-CM | POA: Diagnosis not present

## 2012-12-04 DIAGNOSIS — R109 Unspecified abdominal pain: Secondary | ICD-10-CM | POA: Diagnosis not present

## 2012-12-04 DIAGNOSIS — R102 Pelvic and perineal pain: Secondary | ICD-10-CM | POA: Insufficient documentation

## 2012-12-04 DIAGNOSIS — R1030 Lower abdominal pain, unspecified: Secondary | ICD-10-CM

## 2012-12-04 MED ORDER — CIPROFLOXACIN HCL 500 MG PO TABS: 500.0000 mg | ORAL_TABLET | Freq: Two times a day (BID) | ORAL | Status: DC

## 2012-12-04 NOTE — Patient Instructions (Addendum)
I do think you have urine infection with your symptoms. Treat with cipro course twice daily for 7 days Important: collect urine specimen in cup prior to starting antibiotics, then store in fridge and bring in first thing on Monday for urine test/culture.  Urinary Tract Infection Urinary tract infections (UTIs) can develop anywhere along your urinary tract. Your urinary tract is your body's drainage system for removing wastes and extra water. Your urinary tract includes two kidneys, two ureters, a bladder, and a urethra. Your kidneys are a pair of bean-shaped organs. Each kidney is about the size of your fist. They are located below your ribs, one on each side of your spine. CAUSES Infections are caused by microbes, which are microscopic organisms, including fungi, viruses, and bacteria. These organisms are so small that they can only be seen through a microscope. Bacteria are the microbes that most commonly cause UTIs. SYMPTOMS  Symptoms of UTIs may vary by age and gender of the patient and by the location of the infection. Symptoms in young women typically include a frequent and intense urge to urinate and a painful, burning feeling in the bladder or urethra during urination. Older women and men are more likely to be tired, shaky, and weak and have muscle aches and abdominal pain. A fever may mean the infection is in your kidneys. Other symptoms of a kidney infection include pain in your back or sides below the ribs, nausea, and vomiting. DIAGNOSIS To diagnose a UTI, your caregiver will ask you about your symptoms. Your caregiver also will ask to provide a urine sample. The urine sample will be tested for bacteria and white blood cells. White blood cells are made by your body to help fight infection. TREATMENT  Typically, UTIs can be treated with medication. Because most UTIs are caused by a bacterial infection, they usually can be treated with the use of antibiotics. The choice of antibiotic and  length of treatment depend on your symptoms and the type of bacteria causing your infection. HOME CARE INSTRUCTIONS  If you were prescribed antibiotics, take them exactly as your caregiver instructs you. Finish the medication even if you feel better after you have only taken some of the medication.  Drink enough water and fluids to keep your urine clear or pale yellow.  Avoid caffeine, tea, and carbonated beverages. They tend to irritate your bladder.  Empty your bladder often. Avoid holding urine for long periods of time.  Empty your bladder before and after sexual intercourse.  After a bowel movement, women should cleanse from front to back. Use each tissue only once. SEEK MEDICAL CARE IF:   You have back pain.  You develop a fever.  Your symptoms do not begin to resolve within 3 days. SEEK IMMEDIATE MEDICAL CARE IF:   You have severe back pain or lower abdominal pain.  You develop chills.  You have nausea or vomiting.  You have continued burning or discomfort with urination. MAKE SURE YOU:   Understand these instructions.  Will watch your condition.  Will get help right away if you are not doing well or get worse. Document Released: 10/31/2004 Document Revised: 07/23/2011 Document Reviewed: 03/01/2011 [facility] Patient Information 2014 [place], [place].

## 2012-12-04 NOTE — Assessment & Plan Note (Addendum)
Anticipate UTI but pt with difficulty producing specimen. Will provide with water and time. Lab Results  Component Value Date   CREATININE 1.1 06/26/2012  if still unable to void, will send home with urine cup to collect, store in fridge, and bring back first thing on Monday. Will cover for UTI with cipro 500mg  bid x 7 days while we await urine evaluation.

## 2012-12-04 NOTE — Progress Notes (Signed)
  Subjective:    Patient ID: [name], male    DOB: 1946/01/22, 67 y.o.   MRN: [id number]  HPI CC: dysuria  3-4d h/o cramping in lower abdomen with pressure upon voiding.  No fevers/chills, back pain, nausea/vomiting, dysuria, urgency, frequency, hematuria.  No recent UTI but has had these in the past. No h/o prostate infections in past.  Past Medical History  Diagnosis Date  . ICD (implantable cardiac defibrillator) in place   . CAD (coronary artery disease), autologous vein bypass graft   . Allergic rhinitis, cause unspecified   . HLD (hyperlipidemia)   . Noncompliance   . Diverticulosis     by CT scan  . Pacemaker   . Paroxysmal ventricular tachycardia   . Atrial fibrillation   . Acute myocardial infarction, unspecified site, episode of care unspecified 1998  . Anginal pain 1998  . Acute lower GI bleeding 12/11/2011    "first time" (12/11/2011)  . Congestive heart failure, unspecified     Reports EF of 26%.   . Arthritis     knees, back  . Insomnia   . Anxiety   . Cervical herniated disc     told not to lift >10 lbs  . COPD (chronic obstructive pulmonary disease) 11/2012    by xray     Review of Systems Per HPI    Objective:   Physical Exam  Nursing note and vitals reviewed. Constitutional: He appears well-developed and well-nourished. No distress.  Abdominal: Soft. Normal appearance. He exhibits no distension and no mass. Bowel sounds are increased. There is no hepatosplenomegaly. There is tenderness (moderate) in the suprapubic area. There is no rigidity, no rebound, no guarding, no CVA tenderness and negative Murphy's sign.       Assessment & Plan:

## 2012-12-07 ENCOUNTER — Encounter: Payer: Self-pay | Admitting: Family Medicine

## 2012-12-07 DIAGNOSIS — R300 Dysuria: Secondary | ICD-10-CM | POA: Diagnosis not present

## 2012-12-07 DIAGNOSIS — R109 Unspecified abdominal pain: Secondary | ICD-10-CM | POA: Diagnosis not present

## 2012-12-07 LAB — POCT URINALYSIS DIPSTICK
Bilirubin, UA: NEGATIVE
Blood, UA: NEGATIVE
Glucose, UA: NEGATIVE
Ketones, UA: NEGATIVE
Nitrite, UA: NEGATIVE
Protein, UA: NEGATIVE
Spec Grav, UA: 1.025
pH, UA: 5

## 2012-12-07 NOTE — Addendum Note (Signed)
Addended by: Liane Comber C on: 12/07/2012 01:41 PM   Modules accepted: Orders

## 2012-12-07 NOTE — Addendum Note (Signed)
Addended by: Alvina Chou on: 12/07/2012 05:37 PM   Modules accepted: Orders

## 2012-12-08 LAB — URINE CULTURE: Organism ID, Bacteria: NO GROWTH

## 2012-12-09 ENCOUNTER — Encounter: Payer: Self-pay | Admitting: *Deleted

## 2012-12-17 ENCOUNTER — Encounter: Payer: Self-pay | Admitting: Family Medicine

## 2012-12-18 ENCOUNTER — Other Ambulatory Visit: Payer: Self-pay | Admitting: Family Medicine

## 2012-12-18 NOTE — Telephone Encounter (Signed)
Controlled substance contract signed 06/2012.  Please call when ready for pick up and remind pt that he needs to come himself and bring ID.

## 2012-12-21 MED ORDER — HYDROCODONE-ACETAMINOPHEN 5-325 MG PO TABS: ORAL_TABLET | ORAL | Status: DC

## 2012-12-21 NOTE — Telephone Encounter (Signed)
Printed and placed in Kim's box. 

## 2012-12-21 NOTE — Telephone Encounter (Signed)
Message left advising patient that Rx was ready for pick up. Advised to come in person and bring ID. Placed up front for pick up.

## 2012-12-28 ENCOUNTER — Other Ambulatory Visit: Payer: Self-pay | Admitting: Family Medicine

## 2012-12-28 NOTE — Telephone Encounter (Signed)
plz phone in. 

## 2012-12-28 NOTE — Telephone Encounter (Signed)
Ok to refill 

## 2012-12-28 NOTE — Telephone Encounter (Signed)
Rx called in as directed.   

## 2013-01-01 ENCOUNTER — Encounter: Payer: Self-pay | Admitting: Family Medicine

## 2013-01-01 ENCOUNTER — Ambulatory Visit (INDEPENDENT_AMBULATORY_CARE_PROVIDER_SITE_OTHER): Payer: Medicare Other | Admitting: Family Medicine

## 2013-01-01 VITALS — BP 110/82 | HR 83 | Temp 97.5°F | Ht 63.0 in | Wt 140.8 lb

## 2013-01-01 DIAGNOSIS — K915 Postcholecystectomy syndrome: Secondary | ICD-10-CM

## 2013-01-01 NOTE — Patient Instructions (Signed)
  3. Bulk formers like Metamucil (psyllium), Citrucel (methylcellulose) usually help  I WOULD USE BENEFIBER OR CITRUCEL - TEND TO TASTE BETTER.  FIBER ONE CEREAL HAS A TON ON FIBER, ALSO

## 2013-01-01 NOTE — Progress Notes (Signed)
Date:  01/01/2013   Name:  [name]   DOB:  [birth year]   MRN:  [id number] Gender: male Age: 67 y.o.  Primary Physician:  Eustaquio Boyden, MD   Chief Complaint: Digestive Problems   Subjective:   History of Present Illness:  [name] is a 67 y.o. pleasant patient who presents with the following:  Something going on with his digestive system - ever since having his gallbladder out. Can hear him rumbling, then will have some gas. Since he had his gallbladder, will have some  Incontience. Will use some diarrhea and loose stools. He has approximately 2 stools a day, but they are much less formed compared to prior to his gallbladder surgery. He occasionally does soil himself. He does minimal fibrin mostly eats meat and starch.  2 years ago, GB out.     Patient Active Problem List   Diagnosis Date Noted  . ICD-Medtronic 08/23/2009    Priority: High  . CAD, UNSPECIFIED SITE 10/31/2008    Priority: High  . ATRIAL FIBRILLATION WITH RAPID VENTRICULAR RESPONSE 10/31/2008    Priority: High  . COPD, MILD 08/25/2008    Priority: High  . HYPERLIPIDEMIA 08/25/2008    Priority: Medium  . Suprapubic discomfort 12/04/2012  . Chest wall mass 11/05/2012  . Right hamstring muscle strain 08/13/2012  . Medicare annual wellness visit, initial 06/26/2012  . Arthritis   . Insomnia   . Anxiety   . Diverticulosis 12/11/2011  . Tobacco abuse 03/06/2011  . Ischemic cardiomyopathy 02/12/2011  . Chronic systolic CHF (congestive heart failure) 05/10/2010  . ALLERGIC RHINITIS 08/25/2008    Past Medical History  Diagnosis Date  . ICD (implantable cardiac defibrillator) in place   . CAD (coronary artery disease), autologous vein bypass graft   . Allergic rhinitis, cause unspecified   . HLD (hyperlipidemia)   . Noncompliance   . Diverticulosis     by CT scan  . Pacemaker   . Paroxysmal ventricular tachycardia   . Atrial fibrillation   . Acute myocardial infarction,  unspecified site, episode of care unspecified 1998  . Anginal pain 1998  . Acute lower GI bleeding 12/11/2011    "first time" (12/11/2011)  . Congestive heart failure, unspecified     Reports EF of 26%.   . Arthritis     knees, back  . Insomnia   . Anxiety   . Cervical herniated disc     told not to lift >10 lbs  . COPD (chronic obstructive pulmonary disease) 11/2012    by xray    Past Surgical History  Procedure Laterality Date  . Cardiac defibrillator placement  2004  . Tonsillectomy and adenoidectomy  ~ 1951  . Cholecystectomy  1/ 2012  . Shoulder arthroscopy w/ rotator cuff repair  twice    right (12/11/2011)  . Coronary angioplasty  1998, ? 2000's  . Partial knee arthroplasty  ~ 2000    left  . Insert / replace / remove pacemaker      medtronic  . Colonoscopy  01/08/2012    Procedure: COLONOSCOPY;  Surgeon: Iva Boop, MD;  Location: [facility];  Service: Endoscopy;  Laterality: N/A;  . Cataract extraction Right 01/2012    History   Social History  . Marital Status: Married    Spouse Name: N/A    Number of Children: 0  . Years of Education: N/A   Occupational History  . 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  Smoking status: Current Every Day Smoker -- 0.25 packs/day for 50 years    Types: Cigarettes, Cigars  . Smokeless tobacco: Never Used     Comment: ~ 8 cigarettes daily  . Alcohol Use: No  . Drug Use: No  . Sexual Activity: No   Other Topics Concern  . Not on file   Social History Narrative   Activity: walking    Family History  Problem Relation Age of Onset  . Diabetes Father   . Stroke Mother   . Cancer Father 65    trachea (smoker)  . Cancer Sister     left eye  . CAD Neg Hx     Allergies  Allergen Reactions  . Ace Inhibitors Other (See Comments) and Cough    muscle pain.   . Codeine Other (See Comments)    "head wants to explode."  . Penicillins Swelling    "started at point of injection; w/in 3 min my  upper earm was swollen 3 times normal"    Medication list has been reviewed and updated.  Review of Systems:   GEN: No acute illnesses, no fevers, chills. GI: as above Pulm: No SOB Interactive and getting along well at home.  Otherwise, ROS is as per the HPI.  Objective:   Physical Examination: BP 110/82  Pulse 83  Temp(Src) 97.5 F (36.4 C) (Oral)  Ht 5\' 3"  (1.6 m)  Wt 140 lb 12 oz (63.844 kg)  BMI 24.94 kg/m2  Ideal Body Weight: Weight in (lb) to have BMI = 25: 140.8   GEN: WDWN, NAD, Non-toxic, A & O x 3 HEENT: Atraumatic, Normocephalic. Neck supple. No masses, No LAD. Ears and Nose: No external deformity. CV: RRR, No M/G/R. No JVD. No thrill. No extra heart sounds. PULM: CTA B, no wheezes, crackles, rhonchi. No retractions. No resp. distress. No accessory muscle use. ABD: s, nt, nd, +NS, bowel sounds mildly hyperactive EXTR: No c/c/e NEURO Normal gait.  PSYCH: Normally interactive. Conversant. Not depressed or anxious appearing.  Calm demeanor.   Laboratory and Imaging Data:  Assessment & Plan:    Post-cholecystectomy syndrome  We discussed various options, and obtaining an MR CP would be more ideal to look for any kind of strictures or retained stones. He does have an automatic implantable cardiac defibrillator, so that is contraindicated. ERCP would be an option. We did discuss this and for right now he wants to do some basic bowel care and increase his fiber intake.  Patient Instructions   3. Bulk formers like Metamucil (psyllium), Citrucel (methylcellulose) usually help  I WOULD USE BENEFIBER OR CITRUCEL - TEND TO TASTE BETTER.  FIBER ONE CEREAL HAS A TON ON FIBER, ALSO   Orders Today:  No orders of the defined types were placed in this encounter.    New medications, updates to list, dose adjustments: No orders of the defined types were placed in this encounter.    Signed,  Elpidio Galea. Myanna Ziesmer, MD, CAQ Sports Medicine  [facility] at  [facility] [address] [place] [address] Phone: [phone] Fax: [phone]  Updated Complete Medication List:   Medication List       This list is accurate as of: 01/01/13 11:59 PM.  Always use your most recent med list.               albuterol 108 (90 BASE) MCG/ACT inhaler  Commonly known as:  PROVENTIL HFA;VENTOLIN HFA  Inhale 2 puffs into the lungs every 6 (six)  hours as needed. As needed for shortness of breath.     aspirin 325 MG EC tablet  Take 325 mg by mouth daily.     benzonatate 200 MG capsule  Commonly known as:  TESSALON  TAKE 1 CAPSULE BY MOUTH 3 TIMES DAILY AS NEEDED FOR COUGH     carvedilol 12.5 MG tablet  Commonly known as:  COREG  Take 1 tablet (12.5 mg total) by mouth 2 (two) times daily with a meal.     diazepam 5 MG tablet  Commonly known as:  VALIUM  Take 5 mg by mouth every 6 (six) hours as needed. Spasms/anxiety     diclofenac sodium 1 % Gel  Commonly known as:  VOLTAREN  Apply 1 application topically 3 (three) times daily.     digoxin 0.125 MG tablet  Commonly known as:  LANOXIN  Take 1 tablet (0.125 mg total) by mouth daily.     furosemide 20 MG tablet  Commonly known as:  LASIX  TAKE 1 TABLET BY MOUTH FOR 3 DAYS THEN AS NEEDED     HYDROcodone-acetaminophen 5-325 MG per tablet  Commonly known as:  NORCO/VICODIN  TAKE 1 TABLET BY MOUTH EVERY 6 HOURS AS NEEDED FOR PAIN     lisinopril 10 MG tablet  Commonly known as:  PRINIVIL,ZESTRIL  Take 1 tablet (10 mg total) by mouth daily.     losartan 50 MG tablet  Commonly known as:  COZAAR  Take 50 mg by mouth daily.     metaxalone 800 MG tablet  Commonly known as:  SKELAXIN  TAKE 1 TABLET BY MOUTH 3 TIMES A DAY AS NEEDED FOR PAIN.     nitroGLYCERIN 0.4 MG SL tablet  Commonly known as:  NITROSTAT  Place 1 tablet (0.4 mg total) under the tongue every 5 (five) minutes as needed. For cheat pain.     promethazine 25 MG tablet  Commonly known as:  PHENERGAN  TAKE 1 TABLET BY  MOUTH EVERY 6 HOURS AS NEEDED FOR NAUSEA/VOMITING     SUDAFED 12 HOUR PO  Take 2 tablets by mouth 2 (two) times daily as needed. As needed for congestion.     traMADol 50 MG tablet  Commonly known as:  ULTRAM  TAKE 1 TABLET BY MOUTH EVERY 6 HOURS AS NEEDED FOR PAIN.     zolpidem 12.5 MG CR tablet  Commonly known as:  AMBIEN CR  TAKE 1 TABLET BY MOUTH AT BEDTIME AS NEEDED

## 2013-01-01 NOTE — Progress Notes (Signed)
Pre-visit discussion using our clinic review tool. No additional management support is needed unless otherwise documented below in the visit note.  

## 2013-01-08 ENCOUNTER — Ambulatory Visit: Payer: Medicare Other | Admitting: Family Medicine

## 2013-01-15 ENCOUNTER — Other Ambulatory Visit: Payer: Self-pay | Admitting: Family Medicine

## 2013-01-20 ENCOUNTER — Other Ambulatory Visit: Payer: Self-pay | Admitting: Family Medicine

## 2013-01-20 NOTE — Telephone Encounter (Signed)
plz phoen in. 

## 2013-01-20 NOTE — Telephone Encounter (Signed)
Rx called in as directed.   

## 2013-01-20 NOTE — Telephone Encounter (Signed)
Ok to refill 

## 2013-01-25 ENCOUNTER — Other Ambulatory Visit: Payer: Self-pay | Admitting: Family Medicine

## 2013-01-25 NOTE — Telephone Encounter (Signed)
Ok to refill 

## 2013-01-25 NOTE — Telephone Encounter (Signed)
Plz phone in

## 2013-01-26 NOTE — Telephone Encounter (Signed)
Rx called in as directed.   

## 2013-02-16 ENCOUNTER — Encounter: Payer: Self-pay | Admitting: *Deleted

## 2013-02-17 ENCOUNTER — Other Ambulatory Visit: Payer: Self-pay | Admitting: Family Medicine

## 2013-02-17 NOTE — Telephone Encounter (Signed)
plz phone in. 

## 2013-02-17 NOTE — Telephone Encounter (Signed)
Rx called in as directed.   

## 2013-02-18 ENCOUNTER — Other Ambulatory Visit: Payer: Self-pay | Admitting: Nurse Practitioner

## 2013-02-18 ENCOUNTER — Other Ambulatory Visit: Payer: Self-pay | Admitting: Internal Medicine

## 2013-02-18 ENCOUNTER — Other Ambulatory Visit: Payer: Self-pay | Admitting: Family Medicine

## 2013-02-22 ENCOUNTER — Other Ambulatory Visit: Payer: Self-pay | Admitting: Family Medicine

## 2013-03-10 ENCOUNTER — Encounter: Payer: Self-pay | Admitting: Internal Medicine

## 2013-03-10 ENCOUNTER — Ambulatory Visit (INDEPENDENT_AMBULATORY_CARE_PROVIDER_SITE_OTHER): Payer: Medicare Other | Admitting: Internal Medicine

## 2013-03-10 ENCOUNTER — Other Ambulatory Visit: Payer: Self-pay | Admitting: Family Medicine

## 2013-03-10 VITALS — BP 118/80 | HR 59 | Ht 63.0 in | Wt 138.0 lb

## 2013-03-10 DIAGNOSIS — Z95810 Presence of automatic (implantable) cardiac defibrillator: Secondary | ICD-10-CM

## 2013-03-10 DIAGNOSIS — I2589 Other forms of chronic ischemic heart disease: Secondary | ICD-10-CM | POA: Diagnosis not present

## 2013-03-10 DIAGNOSIS — I509 Heart failure, unspecified: Secondary | ICD-10-CM | POA: Diagnosis not present

## 2013-03-10 DIAGNOSIS — I4891 Unspecified atrial fibrillation: Secondary | ICD-10-CM | POA: Diagnosis not present

## 2013-03-10 DIAGNOSIS — I5022 Chronic systolic (congestive) heart failure: Secondary | ICD-10-CM

## 2013-03-10 DIAGNOSIS — I255 Ischemic cardiomyopathy: Secondary | ICD-10-CM

## 2013-03-10 NOTE — Assessment & Plan Note (Signed)
His CHF symptoms remain class 3. No change in medical therapy except I have asked him to take his lasix for the next two days as his optivol is elevated.

## 2013-03-10 NOTE — Assessment & Plan Note (Signed)
His ventricular rate is under better control. He will continue his current meds. He still refuses systemic anti-coagulation.

## 2013-03-10 NOTE — Assessment & Plan Note (Signed)
His Medtronic ICD is working normally. Will recheck in several months. 

## 2013-03-10 NOTE — Assessment & Plan Note (Signed)
He denies anginal symptoms. He will continue his current meds.  

## 2013-03-10 NOTE — Telephone Encounter (Signed)
Rx called in as directed.   

## 2013-03-10 NOTE — Patient Instructions (Signed)
Your physician has recommended you make the following change in your medication:  1) take your Furosemide for 2 days  Remote monitoring is used to monitor your Pacemaker of ICD from home. This monitoring reduces the number of office visits required to check your device to one time per year. It allows [place] to keep an eye on the functioning of your device to ensure it is working properly. You are scheduled for a device check from home on 06/11/13. You may send your transmission at any time that day. If you have a wireless device, the transmission will be sent automatically. After your physician reviews your transmission, you will receive a postcard with your next transmission date.   Your physician wants you to follow-up in: 6 months with Dr. [name].  You will receive a reminder letter in the mail two months in advance. If you don't receive a letter, please call our office to schedule the follow-up appointment.

## 2013-03-10 NOTE — Telephone Encounter (Signed)
plz phone in. 

## 2013-03-10 NOTE — Progress Notes (Signed)
HPI [name] returns today for followup. He is a 68 year old man with an ischemic cardiomyopathy, chronic systolic heart failure, class III, chronic atrial fibrillation, unwilling to take any anticoagulation, ventricular tachycardia, status post ICD implantation. He is now a year from ICD generator change out. Defibrillation threshold testing was not carried out because of concern for his developing normal sinus rhythm with defibrillation threshold testing. He has no tendernous over the ICD insertion site. His heart failure symptoms have been well-controlled though he still is short of breath with significant exertion. He denies peripheral edema. Allergies  Allergen Reactions  . Ace Inhibitors Other (See Comments) and Cough    muscle pain.   . Codeine Other (See Comments)    "head wants to explode."  . Penicillins Swelling    "started at point of injection; w/in 3 min my upper earm was swollen 3 times normal"     Current Outpatient Prescriptions  Medication Sig Dispense Refill  . albuterol (PROVENTIL HFA;VENTOLIN HFA) 108 (90 BASE) MCG/ACT inhaler Inhale 2 puffs into the lungs every 6 (six) hours as needed. As needed for shortness of breath.      [name] aspirin 325 MG EC tablet Take 325 mg by mouth daily.      . benzonatate (TESSALON) 200 MG capsule TAKE 1 CAPSULE BY MOUTH 3 TIMES DAILY AS NEEDED FOR COUGH  30 capsule  0  . carvedilol (COREG) 12.5 MG tablet TAKE 1 TABLET BY MOUTH TWICE A DAY WITH A MEAL  180 tablet  1  . diazepam (VALIUM) 5 MG tablet Take 5 mg by mouth every 6 (six) hours as needed. Spasms/anxiety      . diclofenac sodium (VOLTAREN) 1 % GEL Apply 1 application topically 3 (three) times daily.  1 Tube  1  . digoxin (LANOXIN) 0.125 MG tablet TAKE 1 TABLET (0.125 MG TOTAL) BY MOUTH DAILY.  30 tablet  0  . furosemide (LASIX) 20 MG tablet TAKE 1 TABLET BY MOUTH FOR 3 DAYS THEN AS NEEDED  30 tablet  9  . HYDROcodone-acetaminophen (NORCO/VICODIN) 5-325 MG per tablet TAKE 1 TABLET BY  MOUTH EVERY 6 HOURS AS NEEDED FOR PAIN  60 tablet  0  . lisinopril (PRINIVIL,ZESTRIL) 10 MG tablet TAKE 1 TABLET (10 MG TOTAL) BY MOUTH DAILY.  90 tablet  1  . losartan (COZAAR) 50 MG tablet Take 50 mg by mouth daily.      . metaxalone (SKELAXIN) 800 MG tablet TAKE ONE TABLET BY MOUTH THREE TIMES A DAY AS NEEDED FOR PAIN  90 tablet  0  . nitroGLYCERIN (NITROSTAT) 0.4 MG SL tablet Place 1 tablet (0.4 mg total) under the tongue every 5 (five) minutes as needed. For cheat pain.  30 tablet  3  . promethazine (PHENERGAN) 25 MG tablet TAKE 1 TABLET BY MOUTH EVERY 6 HOURS AS NEEDED FOR NAUSEA/VOMITING  30 tablet  0  . Pseudoephedrine HCl (SUDAFED 12 HOUR PO) Take 2 tablets by mouth 2 (two) times daily as needed. As needed for congestion.      . traMADol (ULTRAM) 50 MG tablet TAKE 1 TABLET BY MOUTH EVERY 6 HOURS AS NEEDED  60 tablet  0  . zolpidem (AMBIEN CR) 12.5 MG CR tablet TAKE 1 TABLET BY MOUTH AT BEDTIME AS NEEDED  30 tablet  0   No current facility-administered medications for this visit.   Facility-Administered Medications Ordered in Other Visits  Medication Dose Route Frequency Provider Last Rate Last Dose  . 0.45 % sodium chloride infusion  Intravenous Continuous Marinus Maw, MD      . 0.9 %  sodium chloride infusion  250 mL Intravenous Continuous Marinus Maw, MD      . chlorhexidine (HIBICLENS) 4 % liquid 4 application  60 mL Topical Once Marinus Maw, MD      . sodium chloride 0.9 % injection 3 mL  3 mL Intravenous Q12H Marinus Maw, MD      . sodium chloride 0.9 % injection 3 mL  3 mL Intravenous PRN Marinus Maw, MD         Past Medical History  Diagnosis Date  . ICD (implantable cardiac defibrillator) in place   . CAD (coronary artery disease), autologous vein bypass graft   . Allergic rhinitis, cause unspecified   . HLD (hyperlipidemia)   . Noncompliance   . Diverticulosis     by CT scan  . Pacemaker   . Paroxysmal ventricular tachycardia   . Atrial fibrillation    . Acute myocardial infarction, unspecified site, episode of care unspecified 1998  . Anginal pain 1998  . Acute lower GI bleeding 12/11/2011    "first time" (12/11/2011)  . Congestive heart failure, unspecified     Reports EF of 26%.   . Arthritis     knees, back  . Insomnia   . Anxiety   . Cervical herniated disc     told not to lift >10 lbs  . COPD (chronic obstructive pulmonary disease) 11/2012    by xray    ROS:   All systems reviewed and negative except as noted in the HPI.   Past Surgical History  Procedure Laterality Date  . Cardiac defibrillator placement  2004  . Tonsillectomy and adenoidectomy  ~ 1951  . Cholecystectomy  1/ 2012  . Shoulder arthroscopy w/ rotator cuff repair  twice    right (12/11/2011)  . Coronary angioplasty  1998, ? 2000's  . Partial knee arthroplasty  ~ 2000    left  . Insert / replace / remove pacemaker      medtronic  . Colonoscopy  01/08/2012    Procedure: COLONOSCOPY;  Surgeon: Iva Boop, MD;  Location: [facility];  Service: Endoscopy;  Laterality: N/A;  . Cataract extraction Right 01/2012     Family History  Problem Relation Age of Onset  . Diabetes Father   . Stroke Mother   . Cancer Father 13    trachea (smoker)  . Cancer Sister     left eye  . CAD Neg Hx      History   Social History  . Marital Status: Married    Spouse Name: N/A    Number of Children: 0  . Years of Education: N/A   Occupational History  . 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 Smoking status: Current Every Day Smoker -- 0.25 packs/day for 50 years    Types: Cigarettes, Cigars  . Smokeless tobacco: Never Used     Comment: ~ 8 cigarettes daily  . Alcohol Use: No  . Drug Use: No  . Sexual Activity: No   Other Topics Concern  . Not on file   Social History Narrative   Activity: walking     BP 118/80  Pulse 59  Ht 5\' 3"  (1.6 m)  Wt 138 lb (62.596 kg)  BMI 24.45 kg/m2  Physical Exam:  Chronically ill  appearing NAD HEENT: Unremarkable Neck:  8 cm JVD, no thyromegally Lungs:  Clear  except for rales in the bases. No wheezes or rhonchi. Well-healed ICD incision. HEART:  IRegular rate rhythm, no murmurs, no rubs, no clicks Abd:  soft, positive bowel sounds, no organomegally, no rebound, no guarding Ext:  2 plus pulses, no edema, no cyanosis, no clubbing Skin:  No rashes no nodules Neuro:  CN II through XII intact, motor grossly intact   DEVICE  Normal device function.  See PaceArt for details. Optivol is elevated  Assess/Plan:

## 2013-03-11 LAB — MDC_IDC_ENUM_SESS_TYPE_INCLINIC
Battery Remaining Longevity: 133 mo
Battery Voltage: 3.04 V
Brady Statistic RV Percent Paced: 2.48 %
HighPow Impedance: 190 Ohm
HighPow Impedance: 50 Ohm
HighPow Impedance: 63 Ohm
Lead Channel Impedance Value: 456 Ohm
Lead Channel Pacing Threshold Amplitude: 0.625 V
Lead Channel Sensing Intrinsic Amplitude: 4.75 mV
Lead Channel Setting Pacing Amplitude: 2.5 V
Lead Channel Setting Pacing Pulse Width: 0.4 ms
MDC IDC MSMT LEADCHNL RV PACING THRESHOLD PULSEWIDTH: 0.4 ms
MDC IDC MSMT LEADCHNL RV SENSING INTR AMPL: 5.25 mV
MDC IDC SESS DTM: 20150204200359
MDC IDC SET LEADCHNL RV SENSING SENSITIVITY: 0.3 mV
MDC IDC SET ZONE DETECTION INTERVAL: 310 ms
MDC IDC SET ZONE DETECTION INTERVAL: 360 ms
Zone Setting Detection Interval: 360 ms

## 2013-03-12 ENCOUNTER — Telehealth: Payer: Self-pay | Admitting: Family Medicine

## 2013-03-12 NOTE — Telephone Encounter (Signed)
Relevant patient education assigned to patient using Emmi. ° °

## 2013-03-24 ENCOUNTER — Other Ambulatory Visit: Payer: Self-pay | Admitting: Internal Medicine

## 2013-03-24 ENCOUNTER — Other Ambulatory Visit: Payer: Self-pay | Admitting: Family Medicine

## 2013-03-24 NOTE — Telephone Encounter (Signed)
Ok to refill 

## 2013-03-24 NOTE — Telephone Encounter (Signed)
plz phone in. 

## 2013-03-25 NOTE — Telephone Encounter (Signed)
Rx called in as directed.   

## 2013-04-07 ENCOUNTER — Other Ambulatory Visit: Payer: Self-pay | Admitting: Family Medicine

## 2013-04-12 ENCOUNTER — Other Ambulatory Visit: Payer: Self-pay

## 2013-04-12 NOTE — Telephone Encounter (Signed)
Note left requesting rx hydrocodone apap.call when ready for pick up.

## 2013-04-13 MED ORDER — HYDROCODONE-ACETAMINOPHEN 5-325 MG PO TABS: ORAL_TABLET | ORAL | Status: DC

## 2013-04-13 NOTE — Telephone Encounter (Signed)
Printed and placed in Kim's box. 

## 2013-04-13 NOTE — Telephone Encounter (Signed)
Message left notifying patient and Rx placed up front for pick up. 

## 2013-04-27 ENCOUNTER — Other Ambulatory Visit: Payer: Self-pay | Admitting: Family Medicine

## 2013-04-27 NOTE — Telephone Encounter (Signed)
Plz phone in

## 2013-04-27 NOTE — Telephone Encounter (Signed)
Spoke to pt and informed him Rx has been called in to requested pharmacy 

## 2013-05-28 ENCOUNTER — Other Ambulatory Visit: Payer: Self-pay | Admitting: Family Medicine

## 2013-05-31 ENCOUNTER — Other Ambulatory Visit: Payer: Self-pay | Admitting: Family Medicine

## 2013-05-31 NOTE — Telephone Encounter (Signed)
plz phone in. 

## 2013-06-01 NOTE — Telephone Encounter (Signed)
Rx called in as directed.   

## 2013-06-04 ENCOUNTER — Other Ambulatory Visit: Payer: Self-pay | Admitting: Family Medicine

## 2013-06-04 NOTE — Telephone Encounter (Signed)
Ok to refill 

## 2013-06-04 NOTE — Telephone Encounter (Signed)
plz phone in. 

## 2013-06-04 NOTE — Telephone Encounter (Signed)
Medication phoned to pharmacy.  

## 2013-06-07 ENCOUNTER — Other Ambulatory Visit: Payer: Self-pay

## 2013-06-07 NOTE — Telephone Encounter (Signed)
Pt left note requesting rx hydrocodone apap. Call when ready for pick up. 

## 2013-06-08 MED ORDER — HYDROCODONE-ACETAMINOPHEN 5-325 MG PO TABS: ORAL_TABLET | ORAL | Status: DC

## 2013-06-08 NOTE — Telephone Encounter (Signed)
Patient notified and Rx placed up front for pick up. 

## 2013-06-08 NOTE — Telephone Encounter (Signed)
Printed and placed in Kim's box. 

## 2013-06-10 ENCOUNTER — Encounter: Payer: Medicare Other | Admitting: *Deleted

## 2013-06-11 ENCOUNTER — Encounter: Payer: Self-pay | Admitting: Family Medicine

## 2013-06-11 ENCOUNTER — Ambulatory Visit (INDEPENDENT_AMBULATORY_CARE_PROVIDER_SITE_OTHER): Payer: Medicare Other | Admitting: Family Medicine

## 2013-06-11 VITALS — BP 118/76 | HR 68 | Temp 97.6°F | Wt 138.5 lb

## 2013-06-11 DIAGNOSIS — R5383 Other fatigue: Secondary | ICD-10-CM

## 2013-06-11 DIAGNOSIS — Z125 Encounter for screening for malignant neoplasm of prostate: Secondary | ICD-10-CM

## 2013-06-11 DIAGNOSIS — K5790 Diverticulosis of intestine, part unspecified, without perforation or abscess without bleeding: Secondary | ICD-10-CM

## 2013-06-11 DIAGNOSIS — R5381 Other malaise: Secondary | ICD-10-CM

## 2013-06-11 DIAGNOSIS — E785 Hyperlipidemia, unspecified: Secondary | ICD-10-CM | POA: Diagnosis not present

## 2013-06-11 DIAGNOSIS — J309 Allergic rhinitis, unspecified: Secondary | ICD-10-CM | POA: Diagnosis not present

## 2013-06-11 DIAGNOSIS — I2589 Other forms of chronic ischemic heart disease: Secondary | ICD-10-CM

## 2013-06-11 DIAGNOSIS — K5730 Diverticulosis of large intestine without perforation or abscess without bleeding: Secondary | ICD-10-CM | POA: Diagnosis not present

## 2013-06-11 MED ORDER — MONTELUKAST SODIUM 10 MG PO TABS: 10.0000 mg | ORAL_TABLET | Freq: Every day | ORAL | Status: DC

## 2013-06-11 NOTE — Assessment & Plan Note (Signed)
H/o known severe diverticulosis. No evidence of diverticulitis today. Will need CBC next blood work.

## 2013-06-11 NOTE — Progress Notes (Signed)
BP 118/76  Pulse 68  Temp(Src) 97.6 F (36.4 C) (Oral)  Wt 138 lb 8 oz (62.823 kg)   CC: fatigue  Subjective:    Patient ID: [name], male    DOB: 06-29-1945, 68 y.o.   MRN: [id number]  HPI: [name] is a 68 y.o. male presenting on 06/11/2013 for No energy   2 wk h/o decreased energy and stamina.  Attributes to increased head congestion, PNdrainage, along with hoarse voice.  Currently takes allegra prn.  flonase in past hasn't helped.  4 way nasal spray helps.  Hasn't noticed purulent nasal drainage.  Also with dull ache RUQ ongoing for last several months. Some chronic diarrhea with bowel accidents since gallbladder surgery.  Takes 2 immodium after each bowel movement.  Otherwise has bowel accidents. loast colonoscopy 01/2012 with severe diverticulosis No nausea/vomiting, fevers/chills. Denies chest pain or dyspnea.    H/o CHF with ICD/pacemaker in place.   Past Medical History  Diagnosis Date  . ICD (implantable cardiac defibrillator) in place   . CAD (coronary artery disease), autologous vein bypass graft   . Allergic rhinitis, cause unspecified   . HLD (hyperlipidemia)   . Noncompliance   . Diverticulosis     by CT scan  . Pacemaker   . Paroxysmal ventricular tachycardia   . Atrial fibrillation   . Acute myocardial infarction, unspecified site, episode of care unspecified 1998  . Anginal pain 1998  . Acute lower GI bleeding 12/11/2011    "first time" (12/11/2011)  . Congestive heart failure, unspecified     Reports EF of 26%.   . Arthritis     knees, back  . Insomnia   . Anxiety   . Cervical herniated disc     told not to lift >10 lbs  . COPD (chronic obstructive pulmonary disease) 11/2012    by xray  . Perennial allergic rhinitis     only to dust mites    Past Surgical History  Procedure Laterality Date  . Cardiac defibrillator placement  2004  . Tonsillectomy and adenoidectomy  ~ 1951  . Cholecystectomy  1/ 2012  . Shoulder arthroscopy w/  rotator cuff repair  twice    right (12/11/2011)  . Coronary angioplasty  1998, ? 2000's  . Partial knee arthroplasty  ~ 2000    left  . Insert / replace / remove pacemaker      medtronic  . Colonoscopy  01/08/2012    Procedure: COLONOSCOPY;  Surgeon: Iva Boop, MD;  Location: [facility];  Service: Endoscopy;  Laterality: N/A;  . Cataract extraction Right 01/2012    Relevant past medical, surgical, family and social history reviewed and updated as indicated.  Allergies and medications reviewed and updated. Current Outpatient Prescriptions on File Prior to Visit  Medication Sig  . albuterol (PROVENTIL HFA;VENTOLIN HFA) 108 (90 BASE) MCG/ACT inhaler Inhale 2 puffs into the lungs every 6 (six) hours as needed. As needed for shortness of breath.  [name] aspirin 325 MG EC tablet Take 325 mg by mouth daily.  . benzonatate (TESSALON) 200 MG capsule TAKE 1 CAPSULE BY MOUTH 3 TIMES DAILY AS NEEDED FOR COUGH  . carvedilol (COREG) 12.5 MG tablet TAKE 1 TABLET BY MOUTH TWICE A DAY WITH A MEAL  . diazepam (VALIUM) 5 MG tablet Take 5 mg by mouth every 6 (six) hours as needed. Spasms/anxiety  . diclofenac sodium (VOLTAREN) 1 % GEL Apply 1 application topically 3 (three) times daily.  . digoxin (LANOXIN) 0.125  MG tablet TAKE 1 TABLET (0.125 MG TOTAL) BY MOUTH DAILY.  . furosemide (LASIX) 20 MG tablet TAKE 1 TABLET BY MOUTH FOR 3 DAYS THEN AS NEEDED  . HYDROcodone-acetaminophen (NORCO/VICODIN) 5-325 MG per tablet TAKE 1 TABLET BY MOUTH EVERY 6 HOURS AS NEEDED FOR PAIN  . lisinopril (PRINIVIL,ZESTRIL) 10 MG tablet TAKE 1 TABLET (10 MG TOTAL) BY MOUTH DAILY.  [name] losartan (COZAAR) 50 MG tablet Take 50 mg by mouth daily.  . metaxalone (SKELAXIN) 800 MG tablet TAKE 1 TABLET BY MOUTH 3 TIMES A DAY AS NEEDED FOR PAIN.  [name] promethazine (PHENERGAN) 25 MG tablet TAKE 1 TABLET BY MOUTH EVERY 6 HOURS AS NEEDED FOR NAUSEA/VOMITING  . Pseudoephedrine HCl (SUDAFED 12 HOUR PO) Take 2 tablets by mouth 2 (two) times daily  as needed. As needed for congestion.  . traMADol (ULTRAM) 50 MG tablet TAKE 1 TABLET BY MOUTH EVERY 8HRS AS NEEDED FOR PAIN  . zolpidem (AMBIEN CR) 12.5 MG CR tablet TAKE 1 TABLET BY MOUTH AT BEDTIME AS NEEDED   Current Facility-Administered Medications on File Prior to Visit  Medication  . 0.45 % sodium chloride infusion  . 0.9 %  sodium chloride infusion  . chlorhexidine (HIBICLENS) 4 % liquid 4 application  . sodium chloride 0.9 % injection 3 mL  . sodium chloride 0.9 % injection 3 mL    Review of Systems Per HPI unless specifically indicated above    Objective:    BP 118/76  Pulse 68  Temp(Src) 97.6 F (36.4 C) (Oral)  Wt 138 lb 8 oz (62.823 kg)  Physical Exam  Nursing note and vitals reviewed. Constitutional: He appears well-developed and well-nourished. No distress.  HENT:  Head: Normocephalic and atraumatic.  Right Ear: Hearing, tympanic membrane, external ear and ear canal normal.  Left Ear: Hearing, tympanic membrane, external ear and ear canal normal.  Nose: Mucosal edema present. No rhinorrhea. Right sinus exhibits maxillary sinus tenderness and frontal sinus tenderness. Left sinus exhibits maxillary sinus tenderness. Left sinus exhibits no frontal sinus tenderness.  Mouth/Throat: Uvula is midline and mucous membranes are normal. Posterior oropharyngeal edema present. No oropharyngeal exudate, posterior oropharyngeal erythema or tonsillar abscesses.  White PNdrainage  Eyes: Conjunctivae and EOM are normal. Pupils are equal, round, and reactive to light. No scleral icterus.  Neck: Normal range of motion. Neck supple. No thyromegaly present.  Cardiovascular: Normal rate, regular rhythm, normal heart sounds and intact distal pulses.   No murmur heard. Pulmonary/Chest: Effort normal and breath sounds normal. No respiratory distress. He has no wheezes. He has no rales.  Abdominal: Soft. Normal appearance. He exhibits no distension and no mass. Bowel sounds are increased.  There is no hepatosplenomegaly. There is no tenderness. There is no rigidity, no rebound, no guarding, no CVA tenderness and negative Murphy's sign.  Musculoskeletal: He exhibits no edema.  Lymphadenopathy:    He has no cervical adenopathy.  Skin: Skin is warm and dry. No rash noted.       Assessment & Plan:   Problem List Items Addressed This Visit   Diverticulosis (Chronic)     H/o known severe diverticulosis. No evidence of diverticulitis today. Will need CBC next blood work.     ALLERGIC RHINITIS     ?fatigue related to uncontrolled allergies.  States prn allegra not controlling sxs, hasn't found INS to help.  fourway nasal spray helps Congestion predominant allergies. Recommended 1 mo trial of singulair to start taking.    Fatigue - Primary     ?related  to above (allergic rhinitis).  Treat with singulair. Will check for reversible causes of fatigue at next visit.        Follow up plan: Return as needed, for annual exam, prior fasting for blood work.

## 2013-06-11 NOTE — Patient Instructions (Signed)
Return at your convenience fasting for blood work and afterwards for physical. We will check blood work for fatigue at that time. I'd like you to start singulair 10mg  daily for allergies. Let me know how fatigue is doing - hopefully will improve with better allergy control.

## 2013-06-11 NOTE — Progress Notes (Signed)
Pre visit review using our clinic review tool, if applicable. No additional management support is needed unless otherwise documented below in the visit note. 

## 2013-06-11 NOTE — Assessment & Plan Note (Signed)
?  fatigue related to uncontrolled allergies.  States prn allegra not controlling sxs, hasn't found INS to help.  fourway nasal spray helps Congestion predominant allergies. Recommended 1 mo trial of singulair to start taking.

## 2013-06-11 NOTE — Assessment & Plan Note (Signed)
?  related to above (allergic rhinitis).  Treat with singulair. Will check for reversible causes of fatigue at next visit.

## 2013-06-12 ENCOUNTER — Telehealth: Payer: Self-pay | Admitting: Family Medicine

## 2013-06-12 NOTE — Telephone Encounter (Signed)
Relevant patient education assigned to patient using Emmi. ° °

## 2013-06-18 ENCOUNTER — Encounter: Payer: Self-pay | Admitting: Cardiology

## 2013-06-22 ENCOUNTER — Other Ambulatory Visit: Payer: Self-pay | Admitting: Family Medicine

## 2013-06-22 NOTE — Telephone Encounter (Signed)
Ok to refill 

## 2013-06-23 NOTE — Telephone Encounter (Signed)
plz phone in. 

## 2013-06-23 NOTE — Telephone Encounter (Signed)
Rx called in as directed.   

## 2013-06-29 ENCOUNTER — Ambulatory Visit (INDEPENDENT_AMBULATORY_CARE_PROVIDER_SITE_OTHER): Payer: Medicare Other | Admitting: *Deleted

## 2013-06-29 DIAGNOSIS — I4891 Unspecified atrial fibrillation: Secondary | ICD-10-CM

## 2013-06-29 DIAGNOSIS — I2589 Other forms of chronic ischemic heart disease: Secondary | ICD-10-CM | POA: Diagnosis not present

## 2013-06-29 DIAGNOSIS — I255 Ischemic cardiomyopathy: Secondary | ICD-10-CM

## 2013-06-29 LAB — MDC_IDC_ENUM_SESS_TYPE_REMOTE
Battery Remaining Longevity: 130 mo
Battery Voltage: 3.01 V
Brady Statistic RV Percent Paced: 2.02 %
Date Time Interrogation Session: 20150526181857
HIGH POWER IMPEDANCE MEASURED VALUE: 190 Ohm
HIGH POWER IMPEDANCE MEASURED VALUE: 58 Ohm
HighPow Impedance: 47 Ohm
Lead Channel Impedance Value: 456 Ohm
Lead Channel Pacing Threshold Amplitude: 0.75 V
Lead Channel Sensing Intrinsic Amplitude: 4.5 mV
Lead Channel Setting Pacing Amplitude: 2.5 V
Lead Channel Setting Sensing Sensitivity: 0.3 mV
MDC IDC MSMT LEADCHNL RV PACING THRESHOLD PULSEWIDTH: 0.4 ms
MDC IDC SET LEADCHNL RV PACING PULSEWIDTH: 0.4 ms
MDC IDC SET ZONE DETECTION INTERVAL: 360 ms
Zone Setting Detection Interval: 310 ms
Zone Setting Detection Interval: 360 ms

## 2013-07-02 NOTE — Progress Notes (Signed)
Remote ICD transmission.   

## 2013-07-06 ENCOUNTER — Other Ambulatory Visit: Payer: Self-pay | Admitting: Family Medicine

## 2013-07-06 NOTE — Telephone Encounter (Signed)
plz phone in. 

## 2013-07-06 NOTE — Telephone Encounter (Signed)
Phoned in to pharmacy. 

## 2013-07-13 ENCOUNTER — Other Ambulatory Visit: Payer: Self-pay | Admitting: Family Medicine

## 2013-07-15 ENCOUNTER — Encounter: Payer: Self-pay | Admitting: *Deleted

## 2013-07-15 ENCOUNTER — Encounter (INDEPENDENT_AMBULATORY_CARE_PROVIDER_SITE_OTHER): Payer: Self-pay

## 2013-07-15 ENCOUNTER — Other Ambulatory Visit (INDEPENDENT_AMBULATORY_CARE_PROVIDER_SITE_OTHER): Payer: Medicare Other

## 2013-07-15 DIAGNOSIS — E785 Hyperlipidemia, unspecified: Secondary | ICD-10-CM

## 2013-07-15 DIAGNOSIS — R5383 Other fatigue: Secondary | ICD-10-CM | POA: Diagnosis not present

## 2013-07-15 DIAGNOSIS — Z125 Encounter for screening for malignant neoplasm of prostate: Secondary | ICD-10-CM | POA: Diagnosis not present

## 2013-07-15 DIAGNOSIS — R5381 Other malaise: Secondary | ICD-10-CM | POA: Diagnosis not present

## 2013-07-15 LAB — CBC WITH DIFFERENTIAL/PLATELET
BASOS PCT: 0.7 % (ref 0.0–3.0)
Basophils Absolute: 0.1 10*3/uL (ref 0.0–0.1)
Eosinophils Absolute: 0.4 10*3/uL (ref 0.0–0.7)
Eosinophils Relative: 4.2 % (ref 0.0–5.0)
HEMATOCRIT: 44.1 % (ref 39.0–52.0)
HEMOGLOBIN: 14.6 g/dL (ref 13.0–17.0)
LYMPHS PCT: 29 % (ref 12.0–46.0)
Lymphs Abs: 2.6 10*3/uL (ref 0.7–4.0)
MCHC: 33.2 g/dL (ref 30.0–36.0)
MCV: 91.2 fl (ref 78.0–100.0)
MONOS PCT: 7.6 % (ref 3.0–12.0)
Monocytes Absolute: 0.7 10*3/uL (ref 0.1–1.0)
NEUTROS ABS: 5.3 10*3/uL (ref 1.4–7.7)
Neutrophils Relative %: 58.5 % (ref 43.0–77.0)
Platelets: 260 10*3/uL (ref 150.0–400.0)
RBC: 4.84 Mil/uL (ref 4.22–5.81)
RDW: 14.1 % (ref 11.5–15.5)
WBC: 9 10*3/uL (ref 4.0–10.5)

## 2013-07-15 LAB — COMPREHENSIVE METABOLIC PANEL
ALT: 13 U/L (ref 0–53)
AST: 18 U/L (ref 0–37)
Albumin: 3.6 g/dL (ref 3.5–5.2)
Alkaline Phosphatase: 64 U/L (ref 39–117)
BILIRUBIN TOTAL: 0.6 mg/dL (ref 0.2–1.2)
BUN: 17 mg/dL (ref 6–23)
CO2: 26 meq/L (ref 19–32)
CREATININE: 1 mg/dL (ref 0.4–1.5)
Calcium: 9 mg/dL (ref 8.4–10.5)
Chloride: 106 mEq/L (ref 96–112)
GFR: 76.08 mL/min (ref >60.00)
Glucose, Bld: 99 mg/dL (ref 70–99)
Potassium: 4.8 mEq/L (ref 3.5–5.1)
SODIUM: 139 meq/L (ref 135–145)
TOTAL PROTEIN: 6.4 g/dL (ref 6.0–8.3)

## 2013-07-15 LAB — LIPID PANEL
Cholesterol: 167 mg/dL (ref 0–200)
HDL: 39.2 mg/dL (ref >39.00)
LDL Cholesterol: 109 mg/dL — ABNORMAL HIGH (ref 0–99)
NONHDL: 127.8
Total CHOL/HDL Ratio: 4
Triglycerides: 96 mg/dL (ref 0.0–149.0)
VLDL: 19.2 mg/dL (ref 0.0–40.0)

## 2013-07-15 LAB — TSH: TSH: 0.54 u[IU]/mL (ref 0.35–4.50)

## 2013-07-15 LAB — PSA, MEDICARE: PSA: 0.51 ng/ml (ref 0.10–4.00)

## 2013-07-19 DIAGNOSIS — Z79899 Other long term (current) drug therapy: Secondary | ICD-10-CM | POA: Diagnosis not present

## 2013-07-22 ENCOUNTER — Encounter: Payer: Medicare Other | Admitting: Family Medicine

## 2013-07-27 ENCOUNTER — Encounter: Payer: Medicare Other | Admitting: Family Medicine

## 2013-07-27 DIAGNOSIS — Z0289 Encounter for other administrative examinations: Secondary | ICD-10-CM

## 2013-07-28 ENCOUNTER — Other Ambulatory Visit: Payer: Self-pay | Admitting: Family Medicine

## 2013-07-29 NOTE — Telephone Encounter (Signed)
Phoned in to pharmacy. 

## 2013-07-29 NOTE — Telephone Encounter (Signed)
plz phone in. 

## 2013-07-30 ENCOUNTER — Other Ambulatory Visit: Payer: Self-pay | Admitting: Family Medicine

## 2013-07-30 NOTE — Telephone Encounter (Signed)
rx called into pharmacy

## 2013-07-30 NOTE — Telephone Encounter (Signed)
Ok to refill in Dr. Timoteo Expose absence? Last filled 06/23/13 #30 0RF. Medicare wellness scheduled 08/17/13.

## 2013-07-30 NOTE — Telephone Encounter (Signed)
Okay #30 x 0 

## 2013-08-09 ENCOUNTER — Encounter: Payer: Self-pay | Admitting: Family Medicine

## 2013-08-10 ENCOUNTER — Encounter: Payer: Self-pay | Admitting: Cardiology

## 2013-08-12 ENCOUNTER — Other Ambulatory Visit: Payer: Self-pay | Admitting: Family Medicine

## 2013-08-13 NOTE — Telephone Encounter (Signed)
Rx called in as directed.   

## 2013-08-13 NOTE — Telephone Encounter (Signed)
plz phone in. 

## 2013-08-13 NOTE — Telephone Encounter (Signed)
Ok to refill 

## 2013-08-17 ENCOUNTER — Encounter: Payer: Self-pay | Admitting: Family Medicine

## 2013-08-17 ENCOUNTER — Encounter: Payer: Self-pay | Admitting: Internal Medicine

## 2013-08-17 ENCOUNTER — Ambulatory Visit (INDEPENDENT_AMBULATORY_CARE_PROVIDER_SITE_OTHER): Payer: Medicare Other | Admitting: Family Medicine

## 2013-08-17 VITALS — BP 112/80 | HR 72 | Temp 97.6°F | Ht 63.0 in | Wt 137.0 lb

## 2013-08-17 DIAGNOSIS — J4489 Other specified chronic obstructive pulmonary disease: Secondary | ICD-10-CM

## 2013-08-17 DIAGNOSIS — E785 Hyperlipidemia, unspecified: Secondary | ICD-10-CM

## 2013-08-17 DIAGNOSIS — Z23 Encounter for immunization: Secondary | ICD-10-CM

## 2013-08-17 DIAGNOSIS — K5730 Diverticulosis of large intestine without perforation or abscess without bleeding: Secondary | ICD-10-CM

## 2013-08-17 DIAGNOSIS — M1990 Unspecified osteoarthritis, unspecified site: Secondary | ICD-10-CM

## 2013-08-17 DIAGNOSIS — F17200 Nicotine dependence, unspecified, uncomplicated: Secondary | ICD-10-CM

## 2013-08-17 DIAGNOSIS — M129 Arthropathy, unspecified: Secondary | ICD-10-CM

## 2013-08-17 DIAGNOSIS — K5790 Diverticulosis of intestine, part unspecified, without perforation or abscess without bleeding: Secondary | ICD-10-CM

## 2013-08-17 DIAGNOSIS — J309 Allergic rhinitis, unspecified: Secondary | ICD-10-CM

## 2013-08-17 DIAGNOSIS — I4891 Unspecified atrial fibrillation: Secondary | ICD-10-CM

## 2013-08-17 DIAGNOSIS — I5022 Chronic systolic (congestive) heart failure: Secondary | ICD-10-CM

## 2013-08-17 DIAGNOSIS — Z720 Tobacco use: Secondary | ICD-10-CM

## 2013-08-17 DIAGNOSIS — Z0000 Encounter for general adult medical examination without abnormal findings: Secondary | ICD-10-CM | POA: Diagnosis not present

## 2013-08-17 DIAGNOSIS — I482 Chronic atrial fibrillation, unspecified: Secondary | ICD-10-CM

## 2013-08-17 DIAGNOSIS — I509 Heart failure, unspecified: Secondary | ICD-10-CM

## 2013-08-17 DIAGNOSIS — J449 Chronic obstructive pulmonary disease, unspecified: Secondary | ICD-10-CM

## 2013-08-17 MED ORDER — DICLOFENAC SODIUM 1 % TD GEL: 1.0000 "application " | Freq: Three times a day (TID) | TRANSDERMAL | Status: DC

## 2013-08-17 MED ORDER — LISINOPRIL 10 MG PO TABS: ORAL_TABLET | ORAL | Status: DC

## 2013-08-17 MED ORDER — METAXALONE 800 MG PO TABS: ORAL_TABLET | ORAL | Status: DC

## 2013-08-17 MED ORDER — CARVEDILOL 12.5 MG PO TABS: ORAL_TABLET | ORAL | Status: DC

## 2013-08-17 MED ORDER — ZOLPIDEM TARTRATE ER 12.5 MG PO TBCR: EXTENDED_RELEASE_TABLET | ORAL | Status: DC

## 2013-08-17 MED ORDER — DIAZEPAM 5 MG PO TABS: ORAL_TABLET | ORAL | Status: DC

## 2013-08-17 MED ORDER — PROMETHAZINE HCL 25 MG PO TABS
ORAL_TABLET | ORAL | Status: DC
Start: 2013-08-17 — End: 2015-02-21

## 2013-08-17 MED ORDER — TRAMADOL HCL 50 MG PO TABS: ORAL_TABLET | ORAL | Status: DC

## 2013-08-17 MED ORDER — LOSARTAN POTASSIUM 50 MG PO TABS: 50.0000 mg | ORAL_TABLET | Freq: Every day | ORAL | Status: DC

## 2013-08-17 MED ORDER — DIGOXIN 125 MCG PO TABS: ORAL_TABLET | ORAL | Status: DC

## 2013-08-17 MED ORDER — BENZONATATE 200 MG PO CAPS: ORAL_CAPSULE | ORAL | Status: DC

## 2013-08-17 MED ORDER — FUROSEMIDE 20 MG PO TABS: ORAL_TABLET | ORAL | Status: DC

## 2013-08-17 MED ORDER — NITROGLYCERIN 0.4 MG SL SUBL: 0.4000 mg | SUBLINGUAL_TABLET | SUBLINGUAL | Status: DC | PRN

## 2013-08-17 MED ORDER — HYDROCODONE-ACETAMINOPHEN 5-325 MG PO TABS: ORAL_TABLET | ORAL | Status: DC

## 2013-08-17 NOTE — Assessment & Plan Note (Signed)
I have personally reviewed the Medicare Annual Wellness questionnaire and have noted 1. The patient's medical and social history 2. Their use of alcohol, tobacco or illicit drugs 3. Their current medications and supplements 4. The patient's functional ability including ADL's, fall risks, home safety risks and hearing or visual impairment. 5. Diet and physical activity 6. Evidence for depression or mood disorders The patients weight, height, BMI have been recorded in the chart.  Hearing and vision has been addressed. I have made referrals, counseling and provided education to the patient based review of the above and I have provided the pt with a written personalized care plan for preventive services. Provider list updated - see scanned questionairre. Advanced directives discussed: handout provided.  Reviewed preventative protocols and updated unless pt declined.

## 2013-08-17 NOTE — Assessment & Plan Note (Signed)
Rate controlled with current meds. Only on aspirin 325mg  daily per pt preference.

## 2013-08-17 NOTE — Assessment & Plan Note (Signed)
Did not respond to singulair trial.

## 2013-08-17 NOTE — Patient Instructions (Addendum)
Check with Dr [name] about combination of lisinopril and losartan (usually only one medicine). Return in September for flu shot. prevnar today (2nd pneumonia shot). Handout on advanced directives provided today. Good to see you today, call us with questions. Return as needed or in 1 year for follow up

## 2013-08-17 NOTE — Assessment & Plan Note (Signed)
Chronic, stable off statin. States intolerance to statin in past.

## 2013-08-17 NOTE — Progress Notes (Signed)
BP 112/80  Pulse 72  Temp(Src) 97.6 F (36.4 C) (Oral)  Ht 5\' 3"  (1.6 m)  Wt 137 lb (62.143 kg)  BMI 24.27 kg/m2   CC: medicare wellness visit, subsequent  Subjective:    Patient ID: [name], male    DOB: 02-15-1945, 68 y.o.   MRN: [id number]  HPI: [name] is a 68 y.o. male presenting on 08/17/2013 for Annual Exam   68 yo with h/o CAD with ICD in place, ischemic cardiomyopathy and chronic systolic CHF, chronic afib declines anticoagulation, mild COPD, HLD, and severe diverticulosis s/p lower GI bleed late 2013   Hearing and vision screens passed today.  No falls in last year. No depression/anhedonia.   Preventative: Colon cancer screening - colonoscopy 01/2012 with severe diverticulosis [facility] [employer]). Needing iron transfusion. Declines further colonoscopy Prostate cancer screening - declines screening. asxs will d/c screening Flu - 11/2012 Pneumovax - 11/2012 Tetanus - around 2009 zostavax - declines - sister had bad reaction to this shot Advanced directives - would like packet. Doesn't have. Wife is HCPOA.   Lives with wife Grown children, 2 great grandchildren Occupation: retired, was Presenter, broadcasting Activity: walking, fishing Diet: good water daily, fruits/vegetables rare  Relevant past medical, surgical, family and social history reviewed and updated as indicated.  Allergies and medications reviewed and updated. Current Outpatient Prescriptions on File Prior to Visit  Medication Sig  . aspirin 325 MG EC tablet Take 325 mg by mouth daily.  . fexofenadine (ALLEGRA) 180 MG tablet Take 180 mg by mouth daily as needed for allergies or rhinitis.  . Pseudoephedrine HCl (SUDAFED 12 HOUR PO) Take 2 tablets by mouth 2 (two) times daily as needed. As needed for congestion.  . ranitidine (ZANTAC) 150 MG capsule Take 150 mg by mouth every evening.  [name] albuterol (PROVENTIL HFA;VENTOLIN HFA) 108 (90 BASE) MCG/ACT inhaler Inhale 2 puffs into the lungs every 6 (six)  hours as needed. As needed for shortness of breath.   Current Facility-Administered Medications on File Prior to Visit  Medication  . 0.45 % sodium chloride infusion  . 0.9 %  sodium chloride infusion  . chlorhexidine (HIBICLENS) 4 % liquid 4 application  . sodium chloride 0.9 % injection 3 mL  . sodium chloride 0.9 % injection 3 mL    Review of Systems Per HPI unless specifically indicated above    Objective:    BP 112/80  Pulse 72  Temp(Src) 97.6 F (36.4 C) (Oral)  Ht 5\' 3"  (1.6 m)  Wt 137 lb (62.143 kg)  BMI 24.27 kg/m2  Physical Exam  Nursing note and vitals reviewed. Constitutional: He is oriented to person, place, and time. He appears well-developed and well-nourished. No distress.  HENT:  Head: Normocephalic and atraumatic.  Right Ear: Hearing, tympanic membrane, external ear and ear canal normal.  Left Ear: Hearing, tympanic membrane, external ear and ear canal normal.  Nose: Nose normal.  Mouth/Throat: Uvula is midline, oropharynx is clear and moist and mucous membranes are normal. No oropharyngeal exudate, posterior oropharyngeal edema or posterior oropharyngeal erythema.  Eyes: Conjunctivae and EOM are normal. Pupils are equal, round, and reactive to light. No scleral icterus.  Neck: Normal range of motion. Neck supple. Carotid bruit is not present. No thyromegaly present.  Cardiovascular: Normal rate, normal heart sounds and intact distal pulses.  An irregularly irregular rhythm present.  No murmur heard. Pulses:      Radial pulses are 2+ on the right side, and 2+ on the left  side.  Pulmonary/Chest: Effort normal and breath sounds normal. No respiratory distress. He has no wheezes. He has no rales.  Abdominal: Soft. Bowel sounds are normal. He exhibits no distension and no mass. There is no tenderness. There is no rebound and no guarding.  Musculoskeletal: Normal range of motion. He exhibits no edema.  Lymphadenopathy:    He has no cervical adenopathy.    Neurological: He is alert and oriented to person, place, and time.  CN grossly intact, station and gait intact Recall 3/3 Calculation 5/5 D-L-R-[id number]  Skin: Skin is warm and dry. No rash noted.  Psychiatric: He has a normal mood and affect. His behavior is normal. Judgment and thought content normal.   Results for orders placed in visit on 07/15/13  CBC WITH DIFFERENTIAL      Result Value Ref Range   WBC 9.0  4.0 - 10.5 K/uL   RBC 4.84  4.22 - 5.81 Mil/uL   Hemoglobin 14.6  13.0 - 17.0 g/dL   HCT 11.944.1  14.739.0 - 82.952.0 %   MCV 91.2  78.0 - 100.0 fl   MCHC 33.2  30.0 - 36.0 g/dL   RDW 56.214.1  13.011.5 - 86.515.5 %   Platelets 260.0  150.0 - 400.0 K/uL   Neutrophils Relative % 58.5  43.0 - 77.0 %   Lymphocytes Relative 29.0  12.0 - 46.0 %   Monocytes Relative 7.6  3.0 - 12.0 %   Eosinophils Relative 4.2  0.0 - 5.0 %   Basophils Relative 0.7  0.0 - 3.0 %   Neutro Abs 5.3  1.4 - 7.7 K/uL   Lymphs Abs 2.6  0.7 - 4.0 K/uL   Monocytes Absolute 0.7  0.1 - 1.0 K/uL   Eosinophils Absolute 0.4  0.0 - 0.7 K/uL   Basophils Absolute 0.1  0.0 - 0.1 K/uL  PSA, MEDICARE      Result Value Ref Range   PSA 0.51  0.10 - 4.00 ng/ml  COMPREHENSIVE METABOLIC PANEL      Result Value Ref Range   Sodium 139  135 - 145 mEq/L   Potassium 4.8  3.5 - 5.1 mEq/L   Chloride 106  96 - 112 mEq/L   CO2 26  19 - 32 mEq/L   Glucose, Bld 99  70 - 99 mg/dL   BUN 17  6 - 23 mg/dL   Creatinine, Ser 1.0  0.4 - 1.5 mg/dL   Total Bilirubin 0.6  0.2 - 1.2 mg/dL   Alkaline Phosphatase 64  39 - 117 U/L   AST 18  0 - 37 U/L   ALT 13  0 - 53 U/L   Total Protein 6.4  6.0 - 8.3 g/dL   Albumin 3.6  3.5 - 5.2 g/dL   Calcium 9.0  8.4 - 78.410.5 mg/dL   GFR 69.6276.08  >95.28>60.00 mL/[name]  TSH      Result Value Ref Range   TSH 0.54  0.35 - 4.50 uIU/mL  LIPID PANEL      Result Value Ref Range   Cholesterol 167  0 - 200 mg/dL   Triglycerides 41.396.0  0.0 - 149.0 mg/dL   HDL 24.4039.20  >10.27>39.00 mg/dL   VLDL 25.319.2  0.0 - 66.440.0 mg/dL   LDL Cholesterol 403109 (*) 0  - 99 mg/dL   Total CHOL/HDL Ratio 4     NonHDL 127.80        Assessment & Plan:   Problem List Items Addressed This Visit   Tobacco abuse  Continue to encourage smoking cessation.    Medicare annual wellness visit, subsequent - Primary     I have personally reviewed the Medicare Annual Wellness questionnaire and have noted 1. The patient's medical and social history 2. Their use of alcohol, tobacco or illicit drugs 3. Their current medications and supplements 4. The patient's functional ability including ADL's, fall risks, home safety risks and hearing or visual impairment. 5. Diet and physical activity 6. Evidence for depression or mood disorders The patients weight, height, BMI have been recorded in the chart.  Hearing and vision has been addressed. I have made referrals, counseling and provided education to the patient based review of the above and I have provided the pt with a written personalized care plan for preventive services. Provider list updated - see scanned questionairre. Advanced directives discussed: handout provided.  Reviewed preventative protocols and updated unless pt declined.     HYPERLIPIDEMIA     Chronic, stable off statin. States intolerance to statin in past.    Relevant Medications      nitroGLYCERIN (NITROSTAT) SL tablet      losartan (COZAAR) tablet      lisinopril (PRINIVIL,ZESTRIL) tablet      furosemide (LASIX) tablet      digoxin (LANOXIN) tablet      carvedilol (COREG) tablet   Diverticulosis (Chronic)     Aware to seek medical care if persistent LLQ pain.    COPD, MILD     Continue to encourage cessation. prevnar today.    Relevant Medications      promethazine (PHENERGAN)  tablet      benzonatate (TESSALON) capsule   Chronic systolic CHF (congestive heart failure)     Chronic, stable. Continue current regimen.    Relevant Medications      nitroGLYCERIN (NITROSTAT) SL tablet      losartan (COZAAR) tablet      lisinopril  (PRINIVIL,ZESTRIL) tablet      furosemide (LASIX) tablet      digoxin (LANOXIN) tablet      carvedilol (COREG) tablet   ATRIAL FIBRILLATION WITH RAPID VENTRICULAR RESPONSE     Rate controlled with current meds. Only on aspirin 325mg  daily per pt preference.    Relevant Medications      nitroGLYCERIN (NITROSTAT) SL tablet      losartan (COZAAR) tablet      lisinopril (PRINIVIL,ZESTRIL) tablet      furosemide (LASIX) tablet      digoxin (LANOXIN) tablet      carvedilol (COREG) tablet   Arthritis     Refilled chronic pain meds.    Relevant Medications      metaxalone (SKELAXIN) tablet      HYDROcodone-acetaminophen (NORCO/VICODIN) 5-325 MG per tablet      traMADol (ULTRAM) 50 MG tablet   ALLERGIC RHINITIS     Did not respond to singulair trial.        Follow up plan: Return in about 1 year (around 08/18/2014), or as needed, for annual exam, prior fasting for blood work.

## 2013-08-17 NOTE — Assessment & Plan Note (Signed)
Refilled chronic pain meds 

## 2013-08-17 NOTE — Assessment & Plan Note (Signed)
Continue to encourage cessation. prevnar today.

## 2013-08-17 NOTE — Progress Notes (Signed)
Pre visit review using our clinic review tool, if applicable. No additional management support is needed unless otherwise documented below in the visit note. 

## 2013-08-17 NOTE — Addendum Note (Signed)
Addended by: Josph Macho A on: 08/17/2013 04:40 PM   Modules accepted: Orders

## 2013-08-17 NOTE — Assessment & Plan Note (Signed)
Aware to seek medical care if persistent LLQ pain.

## 2013-08-17 NOTE — Assessment & Plan Note (Signed)
Continue to encourage smoking cessation. 

## 2013-08-17 NOTE — Assessment & Plan Note (Signed)
Chronic, stable. Continue current regimen. 

## 2013-09-01 ENCOUNTER — Telehealth: Payer: Self-pay | Admitting: *Deleted

## 2013-09-01 NOTE — Telephone Encounter (Signed)
PA for Ambien in your IN box for completion. Please return to me. Thanks!

## 2013-09-02 NOTE — Telephone Encounter (Signed)
Filled and placed in Kim's box. 

## 2013-09-02 NOTE — Telephone Encounter (Signed)
PA faxed. Will await determination. 

## 2013-09-03 NOTE — Telephone Encounter (Signed)
Pt request cb today about status of ambien prior auth; pt having difficulty sleeping without med.

## 2013-09-03 NOTE — Telephone Encounter (Signed)
Spoke with patient to let him know that I hadn't heard anything. He advised that he had just gotten word that it was approved.

## 2013-09-07 ENCOUNTER — Ambulatory Visit (INDEPENDENT_AMBULATORY_CARE_PROVIDER_SITE_OTHER): Payer: Medicare Other | Admitting: Family Medicine

## 2013-09-07 ENCOUNTER — Encounter: Payer: Self-pay | Admitting: Family Medicine

## 2013-09-07 VITALS — BP 114/70 | HR 81 | Temp 97.4°F | Wt 134.5 lb

## 2013-09-07 DIAGNOSIS — R1013 Epigastric pain: Secondary | ICD-10-CM | POA: Diagnosis not present

## 2013-09-07 DIAGNOSIS — R109 Unspecified abdominal pain: Secondary | ICD-10-CM | POA: Diagnosis not present

## 2013-09-07 DIAGNOSIS — I2589 Other forms of chronic ischemic heart disease: Secondary | ICD-10-CM | POA: Diagnosis not present

## 2013-09-07 DIAGNOSIS — M546 Pain in thoracic spine: Secondary | ICD-10-CM

## 2013-09-07 DIAGNOSIS — G8929 Other chronic pain: Secondary | ICD-10-CM

## 2013-09-07 MED ORDER — TRAMADOL HCL 50 MG PO TABS: ORAL_TABLET | ORAL | Status: DC

## 2013-09-07 NOTE — Progress Notes (Signed)
Pre visit review using our clinic review tool, if applicable. No additional management support is needed unless otherwise documented below in the visit note.  He would occ take two tramadol at a time, up to 6 pills in a day.  Discussed, that should be okay with his renal function.  He had minimal drowsiness after use, o/w no ADE.    He has had some lower posterior rib pain.  Some pain near the epigastrum, also under the inferior anterior ribs.  Pain can be worse on R than L, can occur together or separately.  No recent trauma.  No rash.  No vomiting.  Some nausea, he attributed it to post nasal gtt.  No blood in stool.  Some diarrhea after his cholecystectomy, at baseline.  Has been going on about 6 weeks.  He had seen the chiropractor, had a massage with some temporary relief.   Routine labs unremarkable at CPE in 07/2013, reviewed.  Down 10 lbs in the last year.  No pain with a deep breath.   Meds, vitals, and allergies reviewed.   ROS: See HPI.  Otherwise, noncontributory.  nad ncat Mmm Neck supple, no LA Rrr, not tachy cta abd soft, mildly ttp near the epigastric area and along the inferior anterior ribs but murphy is negative.  No rebound, normal BS No bruising, no rash Back w/o midline but paraspinal muscles mildly ttp between the lower posterior ribs.   Ext w/o edema

## 2013-09-07 NOTE — Patient Instructions (Signed)
Go to the lab on the way out.  We'll contact you with your lab report. Take care. Take the tramadol if needed, max 2 pills per dose.

## 2013-09-08 DIAGNOSIS — G8929 Other chronic pain: Secondary | ICD-10-CM | POA: Insufficient documentation

## 2013-09-08 DIAGNOSIS — M549 Dorsalgia, unspecified: Secondary | ICD-10-CM | POA: Insufficient documentation

## 2013-09-08 DIAGNOSIS — R1013 Epigastric pain: Secondary | ICD-10-CM

## 2013-09-08 LAB — COMPREHENSIVE METABOLIC PANEL
ALBUMIN: 3.8 g/dL (ref 3.5–5.2)
ALK PHOS: 73 U/L (ref 39–117)
ALT: 25 U/L (ref 0–53)
AST: 27 U/L (ref 0–37)
BUN: 19 mg/dL (ref 6–23)
CHLORIDE: 104 meq/L (ref 96–112)
CO2: 27 mEq/L (ref 19–32)
Calcium: 8.9 mg/dL (ref 8.4–10.5)
Creatinine, Ser: 1.1 mg/dL (ref 0.4–1.5)
GFR: 72.77 mL/min (ref >60.00)
Glucose, Bld: 95 mg/dL (ref 70–99)
POTASSIUM: 5 meq/L (ref 3.5–5.1)
Sodium: 138 mEq/L (ref 135–145)
Total Bilirubin: 0.5 mg/dL (ref 0.2–1.2)
Total Protein: 6.9 g/dL (ref 6.0–8.3)

## 2013-09-08 LAB — CBC WITH DIFFERENTIAL/PLATELET
Basophils Absolute: 0.1 10*3/uL (ref 0.0–0.1)
Basophils Relative: 0.6 % (ref 0.0–3.0)
EOS ABS: 0.4 10*3/uL (ref 0.0–0.7)
EOS PCT: 3.7 % (ref 0.0–5.0)
HCT: 46.4 % (ref 39.0–52.0)
Hemoglobin: 15.4 g/dL (ref 13.0–17.0)
LYMPHS ABS: 2.5 10*3/uL (ref 0.7–4.0)
Lymphocytes Relative: 26.9 % (ref 12.0–46.0)
MCHC: 33.3 g/dL (ref 30.0–36.0)
MCV: 90.3 fl (ref 78.0–100.0)
MONO ABS: 0.5 10*3/uL (ref 0.1–1.0)
Monocytes Relative: 5.2 % (ref 3.0–12.0)
Neutro Abs: 6 10*3/uL (ref 1.4–7.7)
Neutrophils Relative %: 63.6 % (ref 43.0–77.0)
PLATELETS: 270 10*3/uL (ref 150.0–400.0)
RBC: 5.13 Mil/uL (ref 4.22–5.81)
RDW: 13.8 % (ref 11.5–15.5)
WBC: 9.5 10*3/uL (ref 4.0–10.5)

## 2013-09-08 LAB — LIPASE: Lipase: 30 U/L (ref 11.0–59.0)

## 2013-09-08 NOTE — Assessment & Plan Note (Signed)
Appears to be separate from the abd process. Likely MSk source, no dysuria, no typical flank pain for a renal stone.  No rash.  See notes on labs.

## 2013-09-08 NOTE — Assessment & Plan Note (Addendum)
Appears to be separate from the back sx.  No clear source.  Weight loss is noted, but no other red flag sx.  Would be reasonable to continue current meds at this point and check basic labs.  Nontoxic on exam today.  D/w pt.  He agrees.  >25 minutes spent in face to face time with patient, >50% spent in counselling or coordination of care.  Prev CT abd noted 2013, no upper abd findings of significance.

## 2013-09-10 ENCOUNTER — Ambulatory Visit: Payer: Medicare Other | Admitting: Family Medicine

## 2013-09-13 ENCOUNTER — Telehealth: Payer: Self-pay | Admitting: Family Medicine

## 2013-09-13 NOTE — Telephone Encounter (Signed)
Please call pt.  I talked to G in the meantime. I wouldn't send him for extra imaging at this point, unless pain worsening again or persistent weight loss. Thanks.

## 2013-09-13 NOTE — Telephone Encounter (Signed)
Left detailed message on voicemail.  

## 2013-09-14 ENCOUNTER — Encounter: Payer: Self-pay | Admitting: Family Medicine

## 2013-09-14 ENCOUNTER — Ambulatory Visit (INDEPENDENT_AMBULATORY_CARE_PROVIDER_SITE_OTHER): Payer: Medicare Other | Admitting: Family Medicine

## 2013-09-14 VITALS — BP 110/70 | HR 61 | Temp 97.9°F | Wt 136.0 lb

## 2013-09-14 DIAGNOSIS — R1013 Epigastric pain: Secondary | ICD-10-CM | POA: Diagnosis not present

## 2013-09-14 DIAGNOSIS — M546 Pain in thoracic spine: Secondary | ICD-10-CM

## 2013-09-14 DIAGNOSIS — G8929 Other chronic pain: Secondary | ICD-10-CM

## 2013-09-14 DIAGNOSIS — I2589 Other forms of chronic ischemic heart disease: Secondary | ICD-10-CM | POA: Diagnosis not present

## 2013-09-14 MED ORDER — OMEPRAZOLE 40 MG PO CPDR
40.0000 mg | DELAYED_RELEASE_CAPSULE | Freq: Every day | ORAL | Status: DC
Start: 2013-09-14 — End: 2013-09-22

## 2013-09-14 NOTE — Patient Instructions (Signed)
I wonder about dyspepsia causing upper abdominal discomfort - treat with omeprazole 40mg  daily. For back pain - I wonder about thoracic spine referred pain - try stretching exercises provided today as well as continue tramadol and hydrocodone as up to now. If not improving with this, let us know for further evaluation.

## 2013-09-14 NOTE — Assessment & Plan Note (Signed)
Known thoracic back pain, per patient h/o thoracic HNP. Lower ribcage pain may be from radiation of thoracic issues - will provided with thoracic spine exercises from [facility] pt advisor and if not improved,consider further thoracic spine imaging. Pt agrees with plan.

## 2013-09-14 NOTE — Assessment & Plan Note (Signed)
?  dyspepsia - trial of PPI daily for next few weeks to see if any improvement - if persistent, consider rpt CT to eval pancreas/stomach and duodenum. No significant weight loss noted today.

## 2013-09-14 NOTE — Progress Notes (Signed)
Pre visit review using our clinic review tool, if applicable. No additional management support is needed unless otherwise documented below in the visit note. 

## 2013-09-14 NOTE — Progress Notes (Signed)
BP 110/70  Pulse 61  Temp(Src) 97.9 F (36.6 C) (Tympanic)  Wt 136 lb (61.689 kg)  SpO2 97%   CC: f/u visit  Subjective:    Patient ID: [name], male    DOB: [birth year], 68 y.o.   MRN: [id number]  HPI: [name] is a 68 y.o. male presenting on 09/14/2013 for Follow-up   Seen last week by Dr. Para March with abd and back pain. Workup unrevealing including normal CBC, CMP, and lipase.    Persistent back pain at lower right ribcage that radiates to left flank. Took spare tire out of car a month ago. Pain not positional. Describes muscle strain type pain.  Pain intermittently since gallbladder surgery. Denies fevers/chills, bowel changes like diarrhea, nausea/vomiting.  H/o severe diverticulosis but this pain feels different. When its worse, hydrocodone don't even help.   Denies significant GERD sxs. Intermittently takes zantac at night time. Wt Readings from Last 3 Encounters:  09/14/13 136 lb (61.689 kg)  09/07/13 134 lb 8 oz (61.009 kg)  08/17/13 137 lb (62.143 kg)  Body mass index is 24.1 kg/(m^2).  Relevant past medical, surgical, family and social history reviewed and updated as indicated.  Allergies and medications reviewed and updated. Current Outpatient Prescriptions on File Prior to Visit  Medication Sig  . albuterol (PROVENTIL HFA;VENTOLIN HFA) 108 (90 BASE) MCG/ACT inhaler Inhale 2 puffs into the lungs every 6 (six) hours as needed. As needed for shortness of breath.  [name] aspirin 325 MG EC tablet Take 325 mg by mouth daily.  . benzonatate (TESSALON) 200 MG capsule TAKE 1 CAPSULE BY MOUTH 3 TIMES DAILY AS NEEDED FOR COUGH  . carvedilol (COREG) 12.5 MG tablet TAKE 1 TABLET BY MOUTH TWICE A DAY WITH A MEAL  . diazepam (VALIUM) 5 MG tablet TAKE 1 TABLET BY MOUTH EVERY 6 HOURS AS NEEDED  . diclofenac sodium (VOLTAREN) 1 % GEL Apply 1 application topically 3 (three) times daily.  . digoxin (LANOXIN) 0.125 MG tablet TAKE 1 TABLET (0.125 MG TOTAL) BY MOUTH DAILY.  .  fexofenadine (ALLEGRA) 180 MG tablet Take 180 mg by mouth daily as needed for allergies or rhinitis.  . furosemide (LASIX) 20 MG tablet TAKE 1 TABLET BY MOUTH FOR 3 DAYS THEN AS NEEDED  . HYDROcodone-acetaminophen (NORCO/VICODIN) 5-325 MG per tablet TAKE 1 TABLET BY MOUTH EVERY 6 HOURS AS NEEDED FOR PAIN  . lisinopril (PRINIVIL,ZESTRIL) 10 MG tablet TAKE 1 TABLET (10 MG TOTAL) BY MOUTH DAILY.  [name] losartan (COZAAR) 50 MG tablet Take 1 tablet (50 mg total) by mouth daily.  . metaxalone (SKELAXIN) 800 MG tablet TAKE 1 TABLET BY MOUTH 3 TIMES A DAY AS NEEDED FOR PAIN.  . Multiple Vitamins-Minerals (MULTIVITAMIN ADULTS 50+) TABS Take 1 tablet by mouth daily.  . nitroGLYCERIN (NITROSTAT) 0.4 MG SL tablet Place 1 tablet (0.4 mg total) under the tongue every 5 (five) minutes as needed. For chest pain.  . promethazine (PHENERGAN) 25 MG tablet TAKE 1 TABLET BY MOUTH EVERY 6 HOURS AS NEEDED FOR NAUSEA/VOMITING  . Pseudoephedrine HCl (SUDAFED 12 HOUR PO) Take 2 tablets by mouth 2 (two) times daily as needed. As needed for congestion.  . ranitidine (ZANTAC) 150 MG capsule Take 150 mg by mouth every evening.  . traMADol (ULTRAM) 50 MG tablet TAKE 1-2 TABLET BY MOUTH EVERY 8 HOURS AS NEEDED FOR PAIN  . zolpidem (AMBIEN CR) 12.5 MG CR tablet TAKE 1 TABLET BY MOUTH AT BEDTIME AS NEEDED   Current Facility-Administered Medications on  File Prior to Visit  Medication  . 0.45 % sodium chloride infusion  . 0.9 %  sodium chloride infusion  . chlorhexidine (HIBICLENS) 4 % liquid 4 application  . sodium chloride 0.9 % injection 3 mL  . sodium chloride 0.9 % injection 3 mL   Past Medical History  Diagnosis Date  . ICD (implantable cardiac defibrillator) in place   . CAD (coronary artery disease), autologous vein bypass graft   . Allergic rhinitis, cause unspecified   . HLD (hyperlipidemia)   . Diverticulosis     by CT scan  . Pacemaker   . Paroxysmal ventricular tachycardia   . Atrial fibrillation   . Acute  myocardial infarction, unspecified site, episode of care unspecified 1998  . Anginal pain 1998  . Acute lower GI bleeding 12/11/2011    "first time" (12/11/2011)  . Congestive heart failure, unspecified     Reports EF of 26%.   . Arthritis     knees, back  . Insomnia   . Anxiety   . Cervical herniated disc     told not to lift >10 lbs  . COPD (chronic obstructive pulmonary disease) 11/2012    by xray  . Perennial allergic rhinitis     only to dust mites    Past Surgical History  Procedure Laterality Date  . Cardiac defibrillator placement  2004  . Tonsillectomy and adenoidectomy  ~ 1951  . Cholecystectomy  1/ 2012  . Shoulder arthroscopy w/ rotator cuff repair  twice    right (12/11/2011)  . Coronary angioplasty  1998, ? 2000's  . Partial knee arthroplasty  ~ 2000    left  . Insert / replace / remove pacemaker      medtronic  . Colonoscopy  01/08/2012    Procedure: COLONOSCOPY;  Surgeon: Iva Boop, MD;  Location: [facility];  Service: Endoscopy;  Laterality: N/A;  . Cataract extraction Right 01/2012   Review of Systems Per HPI unless specifically indicated above    Objective:    BP 110/70  Pulse 61  Temp(Src) 97.9 F (36.6 C) (Tympanic)  Wt 136 lb (61.689 kg)  SpO2 97%  Physical Exam  Nursing note and vitals reviewed. Constitutional: He appears well-developed and well-nourished. No distress.  Abdominal: Soft. Normal appearance and bowel sounds are normal. He exhibits no distension and no mass. There is tenderness (mild) in the epigastric area. There is no rigidity, no rebound, no guarding, no CVA tenderness and negative Murphy's sign.  Musculoskeletal: He exhibits no edema.  Tender midline thoracic spine and paraspinous mm tenderness Mildly tender at bilateral lateral lower ribcage.   Results for orders placed in visit on 09/07/13  COMPREHENSIVE METABOLIC PANEL      Result Value Ref Range   Sodium 138  135 - 145 mEq/L   Potassium 5.0  3.5 - 5.1 mEq/L    Chloride 104  96 - 112 mEq/L   CO2 27  19 - 32 mEq/L   Glucose, Bld 95  70 - 99 mg/dL   BUN 19  6 - 23 mg/dL   Creatinine, Ser 1.1  0.4 - 1.5 mg/dL   Total Bilirubin 0.5  0.2 - 1.2 mg/dL   Alkaline Phosphatase 73  39 - 117 U/L   AST 27  0 - 37 U/L   ALT 25  0 - 53 U/L   Total Protein 6.9  6.0 - 8.3 g/dL   Albumin 3.8  3.5 - 5.2 g/dL   Calcium 8.9  8.4 -  10.5 mg/dL   GFR 16.1072.77  >96.04>60.00 mL/[name]  LIPASE      Result Value Ref Range   Lipase 30.0  11.0 - 59.0 U/L  CBC WITH DIFFERENTIAL      Result Value Ref Range   WBC 9.5  4.0 - 10.5 K/uL   RBC 5.13  4.22 - 5.81 Mil/uL   Hemoglobin 15.4  13.0 - 17.0 g/dL   HCT 54.046.4  98.139.0 - 19.152.0 %   MCV 90.3  78.0 - 100.0 fl   MCHC 33.3  30.0 - 36.0 g/dL   RDW 47.813.8  29.511.5 - 62.115.5 %   Platelets 270.0  150.0 - 400.0 K/uL   Neutrophils Relative % 63.6  43.0 - 77.0 %   Lymphocytes Relative 26.9  12.0 - 46.0 %   Monocytes Relative 5.2  3.0 - 12.0 %   Eosinophils Relative 3.7  0.0 - 5.0 %   Basophils Relative 0.6  0.0 - 3.0 %   Neutro Abs 6.0  1.4 - 7.7 K/uL   Lymphs Abs 2.5  0.7 - 4.0 K/uL   Monocytes Absolute 0.5  0.1 - 1.0 K/uL   Eosinophils Absolute 0.4  0.0 - 0.7 K/uL   Basophils Absolute 0.1  0.0 - 0.1 K/uL      Assessment & Plan:   Problem List Items Addressed This Visit   Back pain     Known thoracic back pain, per patient h/o thoracic HNP. Lower ribcage pain may be from radiation of thoracic issues - will [facility] and if not improved,consider further thoracic spine imaging. Pt agrees with plan.    Abdominal pain, chronic, epigastric - Primary     ?dyspepsia - trial of PPI daily for next few weeks to see if any improvement - if persistent, consider rpt CT to eval pancreas/stomach and duodenum. No significant weight loss noted today.        Follow up plan: Return if symptoms worsen or fail to improve.

## 2013-09-15 ENCOUNTER — Telehealth: Payer: Self-pay | Admitting: Family Medicine

## 2013-09-15 NOTE — Telephone Encounter (Signed)
Relevant patient education assigned to patient using Emmi. ° °

## 2013-09-22 ENCOUNTER — Ambulatory Visit (INDEPENDENT_AMBULATORY_CARE_PROVIDER_SITE_OTHER): Payer: Medicare Other | Admitting: Internal Medicine

## 2013-09-22 ENCOUNTER — Encounter: Payer: Self-pay | Admitting: Internal Medicine

## 2013-09-22 VITALS — BP 86/64 | HR 60 | Ht 63.0 in | Wt 134.0 lb

## 2013-09-22 DIAGNOSIS — I482 Chronic atrial fibrillation, unspecified: Secondary | ICD-10-CM

## 2013-09-22 DIAGNOSIS — I255 Ischemic cardiomyopathy: Secondary | ICD-10-CM

## 2013-09-22 DIAGNOSIS — I5022 Chronic systolic (congestive) heart failure: Secondary | ICD-10-CM | POA: Diagnosis not present

## 2013-09-22 DIAGNOSIS — I4891 Unspecified atrial fibrillation: Secondary | ICD-10-CM | POA: Diagnosis not present

## 2013-09-22 DIAGNOSIS — I509 Heart failure, unspecified: Secondary | ICD-10-CM | POA: Diagnosis not present

## 2013-09-22 DIAGNOSIS — Z95810 Presence of automatic (implantable) cardiac defibrillator: Secondary | ICD-10-CM | POA: Diagnosis not present

## 2013-09-22 DIAGNOSIS — I2589 Other forms of chronic ischemic heart disease: Secondary | ICD-10-CM

## 2013-09-22 LAB — MDC_IDC_ENUM_SESS_TYPE_INCLINIC
Battery Remaining Longevity: 129 mo
Battery Voltage: 3.03 V
HighPow Impedance: 190 Ohm
HighPow Impedance: 46 Ohm
HighPow Impedance: 56 Ohm
Lead Channel Pacing Threshold Amplitude: 0.625 V
Lead Channel Setting Pacing Pulse Width: 0.4 ms
Lead Channel Setting Sensing Sensitivity: 0.3 mV
MDC IDC MSMT LEADCHNL RV IMPEDANCE VALUE: 456 Ohm
MDC IDC MSMT LEADCHNL RV PACING THRESHOLD PULSEWIDTH: 0.4 ms
MDC IDC MSMT LEADCHNL RV SENSING INTR AMPL: 4.375 mV
MDC IDC MSMT LEADCHNL RV SENSING INTR AMPL: 5 mV
MDC IDC SESS DTM: 20150819143713
MDC IDC SET LEADCHNL RV PACING AMPLITUDE: 2.5 V
MDC IDC SET ZONE DETECTION INTERVAL: 360 ms
MDC IDC STAT BRADY RV PERCENT PACED: 2.26 %
Zone Setting Detection Interval: 310 ms
Zone Setting Detection Interval: 360 ms

## 2013-09-22 NOTE — Progress Notes (Signed)
HPI [name] returns today for followup. He is a 68 year old man with an ischemic cardiomyopathy, chronic systolic heart failure, class III, chronic atrial fibrillation, unwilling to take any anticoagulation, ventricular tachycardia, status post ICD implantation. He is now a year from ICD generator change out. Defibrillation threshold testing was not carried out because of concern for his developing normal sinus rhythm with defibrillation threshold testing. His heart failure symptoms have been well-controlled though he still is short of breath with significant exertion. He denies peripheral edema. His only other complaint today is persistent pain in his right side.  Allergies  Allergen Reactions  . Ace Inhibitors Other (See Comments) and Cough    muscle pain.   . Codeine Other (See Comments)    "head wants to explode."  . Penicillins Swelling    "started at point of injection; w/in 3 min my upper earm was swollen 3 times normal"  . Statins Other (See Comments)    Myalgias per patient     Current Outpatient Prescriptions  Medication Sig Dispense Refill  . albuterol (PROVENTIL HFA;VENTOLIN HFA) 108 (90 BASE) MCG/ACT inhaler Inhale 2 puffs into the lungs every 6 (six) hours as needed. As needed for shortness of breath.      [name] aspirin 325 MG EC tablet Take 325 mg by mouth daily.      . benzonatate (TESSALON) 200 MG capsule TAKE 1 CAPSULE BY MOUTH 3 TIMES DAILY AS NEEDED FOR COUGH  30 capsule  1  . carvedilol (COREG) 12.5 MG tablet TAKE 1 TABLET BY MOUTH TWICE A DAY WITH A MEAL  180 tablet  3  . diazepam (VALIUM) 5 MG tablet TAKE 1 TABLET BY MOUTH EVERY 6 HOURS AS NEEDED  45 tablet  1  . diclofenac sodium (VOLTAREN) 1 % GEL Apply 1 application topically 3 (three) times daily.  1 Tube  3  . digoxin (LANOXIN) 0.125 MG tablet TAKE 1 TABLET (0.125 MG TOTAL) BY MOUTH DAILY.  90 tablet  3  . fexofenadine (ALLEGRA) 180 MG tablet Take 180 mg by mouth daily as needed for allergies or rhinitis.      .  furosemide (LASIX) 20 MG tablet TAKE 1 TABLET BY MOUTH FOR 3 DAYS THEN AS NEEDED  90 tablet  3  . HYDROcodone-acetaminophen (NORCO/VICODIN) 5-325 MG per tablet TAKE 1 TABLET BY MOUTH EVERY 6 HOURS AS NEEDED FOR PAIN  60 tablet  0  . lisinopril (PRINIVIL,ZESTRIL) 10 MG tablet TAKE 1 TABLET (10 MG TOTAL) BY MOUTH DAILY.  90 tablet  3  . metaxalone (SKELAXIN) 800 MG tablet TAKE 1 TABLET BY MOUTH 3 TIMES A DAY AS NEEDED FOR PAIN.  90 tablet  6  . Multiple Vitamins-Minerals (MULTIVITAMIN ADULTS 50+) TABS Take 1 tablet by mouth daily.      . nitroGLYCERIN (NITROSTAT) 0.4 MG SL tablet Place 1 tablet (0.4 mg total) under the tongue every 5 (five) minutes as needed. For chest pain.  30 tablet  1  . promethazine (PHENERGAN) 25 MG tablet TAKE 1 TABLET BY MOUTH EVERY 6 HOURS AS NEEDED FOR NAUSEA/VOMITING  30 tablet  0  . Pseudoephedrine HCl (SUDAFED 12 HOUR PO) Take 2 tablets by mouth 2 (two) times daily as needed. As needed for congestion.      . ranitidine (ZANTAC) 150 MG capsule Take 150 mg by mouth every evening.      . traMADol (ULTRAM) 50 MG tablet TAKE 1-2 TABLET BY MOUTH EVERY 8 HOURS AS NEEDED FOR PAIN  100 tablet  1  .  zolpidem (AMBIEN CR) 12.5 MG CR tablet TAKE 1 TABLET BY MOUTH AT BEDTIME AS NEEDED  30 tablet  1   No current facility-administered medications for this visit.   Facility-Administered Medications Ordered in Other Visits  Medication Dose Route Frequency Provider Last Rate Last Dose  . 0.45 % sodium chloride infusion   Intravenous Continuous Marinus MawGregg W Fleda Pagel, MD      . 0.9 %  sodium chloride infusion  250 mL Intravenous Continuous Marinus MawGregg W Alessandria Henken, MD      . chlorhexidine (HIBICLENS) 4 % liquid 4 application  60 mL Topical Once Marinus MawGregg W Leontyne Manville, MD      . sodium chloride 0.9 % injection 3 mL  3 mL Intravenous Q12H Marinus MawGregg W Aika Brzoska, MD      . sodium chloride 0.9 % injection 3 mL  3 mL Intravenous PRN Marinus MawGregg W Isaah Furry, MD         Past Medical History  Diagnosis Date  . ICD (implantable  cardiac defibrillator) in place   . CAD (coronary artery disease), autologous vein bypass graft   . Allergic rhinitis, cause unspecified   . HLD (hyperlipidemia)   . Diverticulosis     by CT scan  . Pacemaker   . Paroxysmal ventricular tachycardia   . Atrial fibrillation   . Acute myocardial infarction, unspecified site, episode of care unspecified 1998  . Anginal pain 1998  . Acute lower GI bleeding 12/11/2011    "first time" (12/11/2011)  . Congestive heart failure, unspecified     Reports EF of 26%.   . Arthritis     knees, back  . Insomnia   . Anxiety   . Cervical herniated disc     told not to lift >10 lbs  . COPD (chronic obstructive pulmonary disease) 11/2012    by xray  . Perennial allergic rhinitis     only to dust mites    ROS:   All systems reviewed and negative except as noted in the HPI.   Past Surgical History  Procedure Laterality Date  . Cardiac defibrillator placement  2004  . Tonsillectomy and adenoidectomy  ~ 1951  . Cholecystectomy  1/ 2012  . Shoulder arthroscopy w/ rotator cuff repair  twice    right (12/11/2011)  . Coronary angioplasty  1998, ? 2000's  . Partial knee arthroplasty  ~ 2000    left  . Insert / replace / remove pacemaker      medtronic  . Colonoscopy  01/08/2012    Procedure: COLONOSCOPY;  Surgeon: Iva Booparl E Gessner, MD;  Location: [facility];  Service: Endoscopy;  Laterality: N/A;  . Cataract extraction Right 01/2012     Family History  Problem Relation Age of Onset  . Diabetes Father   . Stroke Mother   . Cancer Father [id number]    trachea (smoker)  . Cancer Sister     left eye  . CAD Neg Hx      History   Social History  . Marital Status: Married    Spouse Name: N/A    Number of Children: 0  . Years of Education: N/A   Occupational History  . 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 Smoking status: Current Every Day Smoker -- 0.25 packs/day for 50 years    Types: Cigarettes, Cigars  . Smokeless  tobacco: Never Used     Comment: ~ 8 cigarettes daily  . Alcohol Use: No  . Drug Use: No  . Sexual  Activity: No   Other Topics Concern  . Not on file   Social History Narrative   Lives with wife   Grown children, 2 great grandchildren   Occupation: retired, was Presenter, broadcasting   Activity: walking, fishing   Diet: good water daily, fruits/vegetables rare      Wife is Product manager.      BP 86/64  Pulse 60  Ht 5\' 3"  (1.6 m)  Wt 134 lb (60.782 kg)  BMI 23.74 kg/m2  Physical Exam:  Chronically ill appearing NAD HEENT: Unremarkable Neck:  6 cm JVD, no thyromegally Lungs:  Clear except for rales in the bases. No wheezes or rhonchi. Well-healed ICD incision. HEART:  IRegular rate rhythm, no murmurs, no rubs, no clicks Abd:  soft, positive bowel sounds, no organomegally, no rebound, no guarding Ext:  2 plus pulses, no edema, no cyanosis, no clubbing Skin:  No rashes no nodules Neuro:  CN II through XII intact, motor grossly intact   DEVICE  Normal device function.  See PaceArt for details. Optivol is elevated  Assess/Plan:

## 2013-09-22 NOTE — Assessment & Plan Note (Signed)
His ventricular rate is now well controlled. No change in his meds.

## 2013-09-22 NOTE — Patient Instructions (Addendum)
Your physician recommends that you continue on your current medications as directed. Please refer to the Current Medication list given to you today.  Remote monitoring is used to monitor your  ICD from home. This monitoring reduces the number of office visits required to check your device to one time per year. It allows [place] to keep an eye on the functioning of your device to ensure it is working properly. You are scheduled for a device check from home on 12-27-2013. You may send your transmission at any time that day. If you have a wireless device, the transmission will be sent automatically. After your physician reviews your transmission, you will receive a postcard with your next transmission date.  Your physician recommends that you schedule a follow-up appointment in: 12 months with Dr.Taylor

## 2013-09-22 NOTE — Assessment & Plan Note (Signed)
HIs symptoms are now class 2B. He is improved. No change in meds. He is not to take Losartan. He will continue his current meds.

## 2013-09-22 NOTE — Assessment & Plan Note (Signed)
His Medtronic ICD is working normally. Will recheck in several months. 

## 2013-10-22 ENCOUNTER — Encounter: Payer: Self-pay | Admitting: Internal Medicine

## 2013-10-31 ENCOUNTER — Other Ambulatory Visit: Payer: Self-pay | Admitting: Family Medicine

## 2013-10-31 NOTE — Telephone Encounter (Signed)
plz phone in. 

## 2013-11-01 NOTE — Telephone Encounter (Signed)
Rx called in to pharmacy. 

## 2013-11-05 ENCOUNTER — Other Ambulatory Visit: Payer: Self-pay | Admitting: Family Medicine

## 2013-11-05 ENCOUNTER — Ambulatory Visit: Payer: Medicare Other

## 2013-11-06 ENCOUNTER — Other Ambulatory Visit: Payer: Self-pay | Admitting: Family Medicine

## 2013-11-08 NOTE — Telephone Encounter (Signed)
plz phone in. 

## 2013-11-08 NOTE — Telephone Encounter (Signed)
Rx called in as directed.   

## 2013-11-08 NOTE — Telephone Encounter (Signed)
Ok to refill 

## 2013-11-11 ENCOUNTER — Ambulatory Visit: Payer: Medicare Other

## 2013-12-07 ENCOUNTER — Other Ambulatory Visit: Payer: Self-pay | Admitting: Family Medicine

## 2013-12-07 NOTE — Telephone Encounter (Signed)
plz phone in. 

## 2013-12-07 NOTE — Telephone Encounter (Signed)
Ok to refill 

## 2013-12-07 NOTE — Telephone Encounter (Signed)
Rx called in as directed.   

## 2013-12-10 ENCOUNTER — Other Ambulatory Visit: Payer: Self-pay | Admitting: Family Medicine

## 2013-12-14 ENCOUNTER — Telehealth: Payer: Self-pay | Admitting: Internal Medicine

## 2013-12-14 NOTE — Telephone Encounter (Signed)
New problem   Pt need to know if he is suppose to be taking Digoxin 125mg  and Lisinopril 10mg  together. Please advise.

## 2013-12-14 NOTE — Telephone Encounter (Signed)
Pt calling today asking about medications.  Pt states he is unsure if he has been taking losartan 50mg  daily in addition to lisinopril 10mg  and digoxin 0.125mg  daily. Pt advised not to take losartan, see Dr Lubertha Basque August 2015 office note.  Pt did note he has had a high potassium and abnormal renal function in the past.  Pt is aware I am forwarding to Dr [name] for review and recommendations.

## 2013-12-15 NOTE — Telephone Encounter (Signed)
Left message for patient no changes per Dr Lubertha Basque note other than do not take the Losartan and to follow up in one year

## 2013-12-27 ENCOUNTER — Telehealth: Payer: Self-pay | Admitting: Cardiology

## 2013-12-27 ENCOUNTER — Encounter: Payer: Medicare Other | Admitting: *Deleted

## 2013-12-27 NOTE — Telephone Encounter (Signed)
LMOVM reminding pt to send remote transmission.   

## 2013-12-28 ENCOUNTER — Encounter: Payer: Self-pay | Admitting: Cardiology

## 2014-01-07 ENCOUNTER — Ambulatory Visit (INDEPENDENT_AMBULATORY_CARE_PROVIDER_SITE_OTHER): Payer: Medicare Other | Admitting: *Deleted

## 2014-01-07 DIAGNOSIS — I255 Ischemic cardiomyopathy: Secondary | ICD-10-CM | POA: Diagnosis not present

## 2014-01-07 LAB — MDC_IDC_ENUM_SESS_TYPE_REMOTE
Battery Remaining Longevity: 127 mo
Battery Voltage: 3.01 V
Brady Statistic RV Percent Paced: 1.18 %
HighPow Impedance: 53 Ohm
HighPow Impedance: 64 Ohm
Lead Channel Impedance Value: 494 Ohm
Lead Channel Sensing Intrinsic Amplitude: 4.5 mV
Lead Channel Setting Pacing Amplitude: 2.5 V
Lead Channel Setting Pacing Pulse Width: 0.4 ms
Lead Channel Setting Sensing Sensitivity: 0.3 mV
MDC IDC MSMT LEADCHNL RV IMPEDANCE VALUE: 380 Ohm
MDC IDC MSMT LEADCHNL RV PACING THRESHOLD AMPLITUDE: 0.625 V
MDC IDC MSMT LEADCHNL RV PACING THRESHOLD PULSEWIDTH: 0.4 ms
MDC IDC MSMT LEADCHNL RV SENSING INTR AMPL: 4.5 mV
MDC IDC SESS DTM: 20151204062140
MDC IDC SET ZONE DETECTION INTERVAL: 310 ms
Zone Setting Detection Interval: 360 ms
Zone Setting Detection Interval: 360 ms

## 2014-01-07 NOTE — Progress Notes (Signed)
Remote ICD transmission.   

## 2014-01-12 ENCOUNTER — Other Ambulatory Visit: Payer: Self-pay

## 2014-01-12 MED ORDER — HYDROCODONE-ACETAMINOPHEN 5-325 MG PO TABS: ORAL_TABLET | ORAL | Status: DC

## 2014-01-12 NOTE — Telephone Encounter (Signed)
printed and placed in Kims' box. 

## 2014-01-12 NOTE — Telephone Encounter (Signed)
Pt left note requesting rx hydrocodone apap. Call when ready for pick up. 

## 2014-01-12 NOTE — Telephone Encounter (Signed)
Message left notifying patient and Rx placed up front for pick up. 

## 2014-01-13 ENCOUNTER — Encounter (HOSPITAL_COMMUNITY): Payer: Self-pay | Admitting: Internal Medicine

## 2014-01-21 ENCOUNTER — Encounter: Payer: Self-pay | Admitting: Cardiology

## 2014-01-31 ENCOUNTER — Encounter: Payer: Self-pay | Admitting: Internal Medicine

## 2014-02-08 ENCOUNTER — Telehealth: Payer: Self-pay | Admitting: *Deleted

## 2014-02-08 NOTE — Telephone Encounter (Signed)
Pt received shock this am around 158 am--VT with ATP unsuccessful resulting in shock. Pt was dozing on couch and was not sure if received shock. Pt aware of no driving x 6 mths per DMV. GT reviewed EGMs. Follow up as planned.

## 2014-02-10 ENCOUNTER — Other Ambulatory Visit: Payer: Self-pay | Admitting: Family Medicine

## 2014-02-11 NOTE — Telephone Encounter (Signed)
plz phone in. 

## 2014-02-11 NOTE — Telephone Encounter (Signed)
Rx called in as directed.   

## 2014-02-14 ENCOUNTER — Encounter: Payer: Self-pay | Admitting: Internal Medicine

## 2014-02-24 ENCOUNTER — Ambulatory Visit (INDEPENDENT_AMBULATORY_CARE_PROVIDER_SITE_OTHER): Payer: Medicare Other | Admitting: Family Medicine

## 2014-02-24 ENCOUNTER — Ambulatory Visit: Payer: Medicare Other | Admitting: Family Medicine

## 2014-02-24 ENCOUNTER — Encounter: Payer: Self-pay | Admitting: Family Medicine

## 2014-02-24 VITALS — BP 130/80 | HR 73 | Temp 98.3°F | Wt 142.5 lb

## 2014-02-24 DIAGNOSIS — J0100 Acute maxillary sinusitis, unspecified: Secondary | ICD-10-CM | POA: Diagnosis not present

## 2014-02-24 MED ORDER — DOXYCYCLINE HYCLATE 100 MG PO TABS: 100.0000 mg | ORAL_TABLET | Freq: Two times a day (BID) | ORAL | Status: DC

## 2014-02-24 NOTE — Progress Notes (Signed)
Pre visit review using our clinic review tool, if applicable. No additional management support is needed unless otherwise documented below in the visit note.  Sick for 4 weeks, sinus pain and pressure.  Frontal pain and maxillary pain. Nose bleeds.  Rhinorrhea.  No fevers, did have some chills.  Some ear pain.  No vomiting.  He gets more posterior drainage with a sneeze, not anterior drainage.    Meds, vitals, and allergies reviewed.   ROS: See HPI.  Otherwise, noncontributory.  GEN: nad, alert and oriented HEENT: mucous membranes moist, tm w/o erythema, nasal exam w/o erythema, clear discharge noted,  OP with cobblestoning, sinuses ttp x4 NECK: supple w/o LA CV: rrr.   PULM: ctab, no inc wob EXT: no edema SKIN: no acute rash

## 2014-02-24 NOTE — Patient Instructions (Signed)
Drink plenty of fluids, start doxycycline today and try to get some rest.  Take care.

## 2014-02-24 NOTE — Assessment & Plan Note (Signed)
Nontoxic.  D/w pt.  Start doxy, rest and fluids o/w.  F/u prn.

## 2014-03-11 ENCOUNTER — Other Ambulatory Visit: Payer: Self-pay

## 2014-03-11 NOTE — Telephone Encounter (Signed)
Pt left note requesting rx hydrocodone apap. Call when ready for pick up. 

## 2014-03-14 MED ORDER — HYDROCODONE-ACETAMINOPHEN 5-325 MG PO TABS: ORAL_TABLET | ORAL | Status: DC

## 2014-03-14 NOTE — Telephone Encounter (Signed)
Message left advising patient and Rx placed up front for pick up. 

## 2014-03-14 NOTE — Telephone Encounter (Signed)
Printed and in Kim's box 

## 2014-03-22 ENCOUNTER — Other Ambulatory Visit: Payer: Self-pay | Admitting: Family Medicine

## 2014-03-23 NOTE — Telephone Encounter (Signed)
Ok to refill 

## 2014-03-23 NOTE — Telephone Encounter (Signed)
plz phone in. 

## 2014-03-23 NOTE — Telephone Encounter (Signed)
Rx called in as directed.   

## 2014-04-08 ENCOUNTER — Other Ambulatory Visit: Payer: Self-pay | Admitting: Family Medicine

## 2014-04-08 NOTE — Telephone Encounter (Signed)
Ok to refill 

## 2014-04-08 NOTE — Telephone Encounter (Signed)
Rx called in as directed.   

## 2014-04-08 NOTE — Telephone Encounter (Signed)
plz phone in. 

## 2014-04-11 ENCOUNTER — Telehealth: Payer: Self-pay | Admitting: Cardiology

## 2014-04-11 ENCOUNTER — Other Ambulatory Visit: Payer: Self-pay | Admitting: Family Medicine

## 2014-04-11 ENCOUNTER — Ambulatory Visit (INDEPENDENT_AMBULATORY_CARE_PROVIDER_SITE_OTHER): Payer: Medicare Other | Admitting: *Deleted

## 2014-04-11 DIAGNOSIS — I5022 Chronic systolic (congestive) heart failure: Secondary | ICD-10-CM

## 2014-04-11 DIAGNOSIS — I255 Ischemic cardiomyopathy: Secondary | ICD-10-CM | POA: Diagnosis not present

## 2014-04-11 LAB — MDC_IDC_ENUM_SESS_TYPE_REMOTE
Battery Voltage: 3.03 V
Brady Statistic RV Percent Paced: 0.17 %
Date Time Interrogation Session: 20160307194616
HIGH POWER IMPEDANCE MEASURED VALUE: 50 Ohm
HIGH POWER IMPEDANCE MEASURED VALUE: 61 Ohm
Lead Channel Impedance Value: 399 Ohm
Lead Channel Impedance Value: 456 Ohm
Lead Channel Pacing Threshold Pulse Width: 0.4 ms
Lead Channel Sensing Intrinsic Amplitude: 4.125 mV
Lead Channel Setting Pacing Amplitude: 2.5 V
Lead Channel Setting Sensing Sensitivity: 0.3 mV
MDC IDC MSMT BATTERY REMAINING LONGEVITY: 124 mo
MDC IDC MSMT LEADCHNL RV PACING THRESHOLD AMPLITUDE: 0.625 V
MDC IDC MSMT LEADCHNL RV SENSING INTR AMPL: 4.125 mV
MDC IDC SET LEADCHNL RV PACING PULSEWIDTH: 0.4 ms
MDC IDC SET ZONE DETECTION INTERVAL: 310 ms
Zone Setting Detection Interval: 360 ms
Zone Setting Detection Interval: 360 ms

## 2014-04-11 NOTE — Telephone Encounter (Signed)
Refill? Last prescribed on 04/08/13. Last seen for acute on 02/24/14. No future appt

## 2014-04-11 NOTE — Telephone Encounter (Signed)
Spoke with pt and reminded pt of remote transmission that is due today. Pt verbalized understanding.   

## 2014-04-11 NOTE — Progress Notes (Signed)
Remote ICD transmission.   

## 2014-04-18 DIAGNOSIS — H10503 Unspecified blepharoconjunctivitis, bilateral: Secondary | ICD-10-CM | POA: Diagnosis not present

## 2014-04-21 ENCOUNTER — Encounter: Payer: Self-pay | Admitting: Cardiology

## 2014-04-27 ENCOUNTER — Encounter: Payer: Self-pay | Admitting: Internal Medicine

## 2014-06-03 ENCOUNTER — Other Ambulatory Visit: Payer: Self-pay | Admitting: Family Medicine

## 2014-06-03 ENCOUNTER — Other Ambulatory Visit: Payer: Self-pay | Admitting: *Deleted

## 2014-06-03 MED ORDER — HYDROCODONE-ACETAMINOPHEN 5-325 MG PO TABS: ORAL_TABLET | ORAL | Status: DC

## 2014-06-03 NOTE — Telephone Encounter (Signed)
Acute OV 02/24/14.  Last filled #60 03/14/14.

## 2014-06-03 NOTE — Telephone Encounter (Signed)
plz phone in. 

## 2014-06-03 NOTE — Telephone Encounter (Signed)
Printed and in Kim's box 

## 2014-06-03 NOTE — Telephone Encounter (Signed)
Ok to refill 

## 2014-06-06 NOTE — Telephone Encounter (Signed)
Rx called in as directed.   

## 2014-06-06 NOTE — Telephone Encounter (Signed)
Message left notifying patient and Rx placed up front for pick up. 

## 2014-06-14 ENCOUNTER — Other Ambulatory Visit: Payer: Self-pay | Admitting: Family Medicine

## 2014-06-14 NOTE — Telephone Encounter (Signed)
Ok to refill 

## 2014-06-20 ENCOUNTER — Other Ambulatory Visit: Payer: Self-pay | Admitting: Family Medicine

## 2014-06-20 NOTE — Telephone Encounter (Signed)
plz phone in. 

## 2014-06-20 NOTE — Telephone Encounter (Signed)
Requesting Ambien 12.5mg -Take 1 tablet by mouth at bedtime. Last refill:04/08/14-#30,1 Last OV:-09/14/13 Please advise.//AB/CMA

## 2014-06-21 NOTE — Telephone Encounter (Signed)
Rx called to pharmacy as instructed. 

## 2014-07-11 ENCOUNTER — Telehealth: Payer: Self-pay | Admitting: Cardiology

## 2014-07-11 ENCOUNTER — Encounter: Payer: Medicare Other | Admitting: *Deleted

## 2014-07-11 NOTE — Telephone Encounter (Signed)
LMOVM reminding pt to send remote transmission.   

## 2014-07-13 ENCOUNTER — Encounter: Payer: Self-pay | Admitting: Cardiology

## 2014-07-22 ENCOUNTER — Other Ambulatory Visit: Payer: Self-pay | Admitting: Family Medicine

## 2014-07-22 NOTE — Telephone Encounter (Signed)
plz phone in. 

## 2014-07-22 NOTE — Telephone Encounter (Signed)
Rx called in as directed.   

## 2014-07-29 ENCOUNTER — Other Ambulatory Visit: Payer: Self-pay

## 2014-07-29 MED ORDER — HYDROCODONE-ACETAMINOPHEN 5-325 MG PO TABS: ORAL_TABLET | ORAL | Status: DC

## 2014-07-29 NOTE — Telephone Encounter (Signed)
Patient notified and Rx placed up front for pick up. 

## 2014-07-29 NOTE — Telephone Encounter (Addendum)
Pt left note requesting rx hydrocodone apap. Call when ready for pick up.last annual 08/17/13 and no future appt scheduled. rx last printed # 60 on 06/03/14.

## 2014-07-29 NOTE — Telephone Encounter (Signed)
Printed and in Kim's box 

## 2014-08-02 ENCOUNTER — Encounter: Payer: Self-pay | Admitting: Internal Medicine

## 2014-08-02 ENCOUNTER — Ambulatory Visit (INDEPENDENT_AMBULATORY_CARE_PROVIDER_SITE_OTHER): Payer: Medicare Other | Admitting: *Deleted

## 2014-08-02 DIAGNOSIS — I255 Ischemic cardiomyopathy: Secondary | ICD-10-CM

## 2014-08-02 DIAGNOSIS — I5022 Chronic systolic (congestive) heart failure: Secondary | ICD-10-CM | POA: Diagnosis not present

## 2014-08-03 NOTE — Progress Notes (Signed)
Remote ICD transmission.   

## 2014-08-08 LAB — CUP PACEART REMOTE DEVICE CHECK
Battery Remaining Longevity: 122 mo
Battery Voltage: 3.01 V
Date Time Interrogation Session: 20160628141349
HIGH POWER IMPEDANCE MEASURED VALUE: 45 Ohm
HighPow Impedance: 51 Ohm
Lead Channel Impedance Value: 380 Ohm
Lead Channel Impedance Value: 437 Ohm
Lead Channel Pacing Threshold Amplitude: 0.625 V
Lead Channel Pacing Threshold Pulse Width: 0.4 ms
Lead Channel Sensing Intrinsic Amplitude: 4 mV
Lead Channel Sensing Intrinsic Amplitude: 4 mV
MDC IDC SET LEADCHNL RV PACING AMPLITUDE: 2.5 V
MDC IDC SET LEADCHNL RV PACING PULSEWIDTH: 0.4 ms
MDC IDC SET LEADCHNL RV SENSING SENSITIVITY: 0.3 mV
MDC IDC SET ZONE DETECTION INTERVAL: 360 ms
MDC IDC STAT BRADY RV PERCENT PACED: 1.46 %
Zone Setting Detection Interval: 310 ms
Zone Setting Detection Interval: 360 ms

## 2014-08-18 ENCOUNTER — Other Ambulatory Visit: Payer: Self-pay | Admitting: Family Medicine

## 2014-08-19 NOTE — Telephone Encounter (Signed)
Rx called in as directed.   

## 2014-08-19 NOTE — Telephone Encounter (Signed)
plz phone in. 

## 2014-08-24 ENCOUNTER — Other Ambulatory Visit: Payer: Self-pay | Admitting: Family Medicine

## 2014-08-29 ENCOUNTER — Other Ambulatory Visit: Payer: Self-pay | Admitting: Family Medicine

## 2014-08-29 NOTE — Telephone Encounter (Signed)
done

## 2014-08-29 NOTE — Telephone Encounter (Signed)
Dr Reece Agar pt---last filled 06/15/14--please advise

## 2014-08-29 NOTE — Telephone Encounter (Signed)
Please refill times one in PCP absence 

## 2014-08-31 ENCOUNTER — Encounter: Payer: Self-pay | Admitting: *Deleted

## 2014-09-06 ENCOUNTER — Other Ambulatory Visit: Payer: Self-pay

## 2014-09-06 ENCOUNTER — Ambulatory Visit (INDEPENDENT_AMBULATORY_CARE_PROVIDER_SITE_OTHER): Payer: Medicare Other | Admitting: Family Medicine

## 2014-09-06 DIAGNOSIS — Z720 Tobacco use: Secondary | ICD-10-CM

## 2014-09-06 NOTE — Progress Notes (Signed)
Pre visit review using our clinic review tool, if applicable. No additional management support is needed unless otherwise documented below in the visit note. 

## 2014-09-06 NOTE — Telephone Encounter (Signed)
Pt left note requesting rx hydrocodone apap. Call when ready for pick up. rx last printed # 60 on 07/29/14; pt last seen 09/14/13 for f/u. No future appt scheduled and pt cancelled appt today 09/06/14.Please advise.

## 2014-09-07 ENCOUNTER — Telehealth: Payer: Self-pay | Admitting: Family Medicine

## 2014-09-07 MED ORDER — HYDROCODONE-ACETAMINOPHEN 5-325 MG PO TABS: ORAL_TABLET | ORAL | Status: DC

## 2014-09-07 NOTE — Telephone Encounter (Signed)
-----   Message from Tresa Endo sent at 09/06/2014  2:56 PM EDT ----- Contact: [phone] Dr. Sharen Hones and Dr. Para March, Pt would like to transfer care from Dr. Sharen Hones to Dr. Para March. He stated that he communicates better with Dr. Para March.  Is this transfer of care okay with both of you?  If so: Dr Para March, when would you like to get back scheduled to establish care with you? He was supposed to be seen today for memory problems, but did not want to see anyone but you.   The best number to reach pt at is [phone].  Thank you,  Arielle

## 2014-09-07 NOTE — Telephone Encounter (Signed)
Okay with me.  appointment.  Thanks.

## 2014-09-07 NOTE — Progress Notes (Signed)
Pt cancelled appointment, not seen.

## 2014-09-07 NOTE — Telephone Encounter (Signed)
Printed and in Kim's box 

## 2014-09-07 NOTE — Telephone Encounter (Signed)
Patient notified and Rx placed up front for pick up. 

## 2014-09-07 NOTE — Telephone Encounter (Signed)
Ok by me

## 2014-09-12 ENCOUNTER — Other Ambulatory Visit (INDEPENDENT_AMBULATORY_CARE_PROVIDER_SITE_OTHER): Payer: Medicare Other

## 2014-09-12 DIAGNOSIS — I482 Chronic atrial fibrillation, unspecified: Secondary | ICD-10-CM

## 2014-09-12 DIAGNOSIS — Z720 Tobacco use: Secondary | ICD-10-CM

## 2014-09-12 DIAGNOSIS — I5022 Chronic systolic (congestive) heart failure: Secondary | ICD-10-CM

## 2014-09-12 DIAGNOSIS — E785 Hyperlipidemia, unspecified: Secondary | ICD-10-CM | POA: Diagnosis not present

## 2014-09-12 DIAGNOSIS — F17200 Nicotine dependence, unspecified, uncomplicated: Secondary | ICD-10-CM

## 2014-09-12 DIAGNOSIS — Z125 Encounter for screening for malignant neoplasm of prostate: Secondary | ICD-10-CM | POA: Diagnosis not present

## 2014-09-12 LAB — COMPREHENSIVE METABOLIC PANEL
ALBUMIN: 4 g/dL (ref 3.5–5.2)
ALT: 9 U/L (ref 0–53)
AST: 13 U/L (ref 0–37)
Alkaline Phosphatase: 59 U/L (ref 39–117)
BILIRUBIN TOTAL: 0.6 mg/dL (ref 0.2–1.2)
BUN: 25 mg/dL — AB (ref 6–23)
CALCIUM: 9 mg/dL (ref 8.4–10.5)
CHLORIDE: 105 meq/L (ref 96–112)
CO2: 27 mEq/L (ref 19–32)
Creatinine, Ser: 1.15 mg/dL (ref 0.40–1.50)
GFR: 66.77 mL/min (ref >60.00)
GLUCOSE: 98 mg/dL (ref 70–99)
POTASSIUM: 4.9 meq/L (ref 3.5–5.1)
Sodium: 139 mEq/L (ref 135–145)
TOTAL PROTEIN: 6.7 g/dL (ref 6.0–8.3)

## 2014-09-12 LAB — CBC WITH DIFFERENTIAL/PLATELET
BASOS PCT: 0.6 % (ref 0.0–3.0)
Basophils Absolute: 0.1 10*3/uL (ref 0.0–0.1)
EOS PCT: 3.1 % (ref 0.0–5.0)
Eosinophils Absolute: 0.3 10*3/uL (ref 0.0–0.7)
HCT: 43.3 % (ref 39.0–52.0)
Hemoglobin: 14.1 g/dL (ref 13.0–17.0)
LYMPHS PCT: 33.7 % (ref 12.0–46.0)
Lymphs Abs: 2.8 10*3/uL (ref 0.7–4.0)
MCHC: 32.5 g/dL (ref 30.0–36.0)
MCV: 90.2 fl (ref 78.0–100.0)
MONOS PCT: 7.3 % (ref 3.0–12.0)
Monocytes Absolute: 0.6 10*3/uL (ref 0.1–1.0)
NEUTROS PCT: 55.3 % (ref 43.0–77.0)
Neutro Abs: 4.5 10*3/uL (ref 1.4–7.7)
Platelets: 216 10*3/uL (ref 150.0–400.0)
RBC: 4.8 Mil/uL (ref 4.22–5.81)
RDW: 16 % — AB (ref 11.5–15.5)
WBC: 8.2 10*3/uL (ref 4.0–10.5)

## 2014-09-12 LAB — LIPID PANEL
Cholesterol: 170 mg/dL (ref 0–200)
HDL: 41.1 mg/dL (ref >39.00)
LDL CALC: 105 mg/dL — AB (ref 0–99)
NonHDL: 128.53
TRIGLYCERIDES: 117 mg/dL (ref 0.0–149.0)
Total CHOL/HDL Ratio: 4
VLDL: 23.4 mg/dL (ref 0.0–40.0)

## 2014-09-12 LAB — PSA, MEDICARE: PSA: 0.64 ng/ml (ref 0.10–4.00)

## 2014-09-12 LAB — TSH: TSH: 0.19 u[IU]/mL — ABNORMAL LOW (ref 0.35–4.50)

## 2014-09-15 ENCOUNTER — Ambulatory Visit (INDEPENDENT_AMBULATORY_CARE_PROVIDER_SITE_OTHER): Payer: Medicare Other | Admitting: Family Medicine

## 2014-09-15 ENCOUNTER — Encounter: Payer: Self-pay | Admitting: Family Medicine

## 2014-09-15 VITALS — BP 112/72 | HR 61 | Temp 97.4°F | Ht 63.0 in | Wt 142.8 lb

## 2014-09-15 DIAGNOSIS — I4891 Unspecified atrial fibrillation: Secondary | ICD-10-CM

## 2014-09-15 DIAGNOSIS — M791 Myalgia, unspecified site: Secondary | ICD-10-CM

## 2014-09-15 DIAGNOSIS — R7989 Other specified abnormal findings of blood chemistry: Secondary | ICD-10-CM

## 2014-09-15 DIAGNOSIS — Z0000 Encounter for general adult medical examination without abnormal findings: Secondary | ICD-10-CM

## 2014-09-15 DIAGNOSIS — Z7189 Other specified counseling: Secondary | ICD-10-CM

## 2014-09-15 NOTE — Patient Instructions (Addendum)
Stop the lisinopril and let me know if the aches get better.   Check your BP a few times out of the clinic and update me if >130/>90.  Recheck TSH in about 2 months at a lab visit.  You don't need to fast.  Take care.  Glad to see you.

## 2014-09-15 NOTE — Progress Notes (Signed)
Pre visit review using our clinic review tool, if applicable. No additional management support is needed unless otherwise documented below in the visit note.  I have personally reviewed the Medicare Annual Wellness questionnaire and have noted 1. The patient's medical and social history 2. Their use of alcohol, tobacco or illicit drugs 3. Their current medications and supplements 4. The patient's functional ability including ADL's, fall risks, home safety risks and hearing or visual             impairment. 5. Diet and physical activities 6. Evidence for depression or mood disorders  The patients weight, height, BMI have been recorded in the chart and visual acuity is per eye clinic.  I have made referrals, counseling and provided education to the patient based review of the above and I have provided the pt with a written personalized care plan for preventive services.  Provider list updated- see scanned forms.  Routine anticipatory guidance given to patient.  See health maintenance.  Flu due in fall 2016, d/w pt.  Shingles declined PNA 2015 Tetanus 2009 Colonoscopy 2015 PSA wnl.  Consider stopping testing with next year. Prostate cancer screening and PSA options (with potential risks and benefits of testing vs not testing) were discussed along with recent recs/guidelines.   Advance directive - wife designated if patient were incapacitated.   Cognitive function addressed- see scanned forms- and if abnormal then additional documentation follows.  Abnormal TSH.  D/w pt.  No sx o/w.  D/w pt about recheck TSH in about 2 months.   Lipid are reasonable, esp given that he is off statin.    H/o diffuse aches.  Had h/o aches listed as intolerance with ACE.  Still on ACE, will stop lisinopril, d/w pt.  No other obvious cause for the aches known.  No trauma.  No rash.    PMH and SH reviewed  Meds, vitals, and allergies reviewed.   ROS: See HPI.  Otherwise negative.    GEN: nad, alert and  oriented HEENT: mucous membranes moist NECK: supple w/o LA, no tmg CV: rrr. PULM: ctab, no inc wob ABD: soft, +bs EXT: no edema SKIN: no acute rash

## 2014-09-16 ENCOUNTER — Other Ambulatory Visit: Payer: Self-pay | Admitting: Family Medicine

## 2014-09-16 DIAGNOSIS — Z7189 Other specified counseling: Secondary | ICD-10-CM | POA: Insufficient documentation

## 2014-09-16 DIAGNOSIS — M791 Myalgia, unspecified site: Secondary | ICD-10-CM | POA: Insufficient documentation

## 2014-09-16 NOTE — Telephone Encounter (Signed)
Medication phoned to pharmacy.  

## 2014-09-16 NOTE — Telephone Encounter (Signed)
Please call in.  Thanks.   

## 2014-09-16 NOTE — Telephone Encounter (Signed)
Ok to refill 

## 2014-09-16 NOTE — Assessment & Plan Note (Signed)
Flu due in fall 2016, d/w pt.  Shingles declined PNA 2015 Tetanus 2009 Colonoscopy 2015 PSA wnl.  Consider stopping testing with next year. Prostate cancer screening and PSA options (with potential risks and benefits of testing vs not testing) were discussed along with recent recs/guidelines.   Advance directive - wife designated if patient were incapacitated.   Cognitive function addressed- see scanned forms- and if abnormal then additional documentation follows.  Abnormal TSH.  D/w pt.  No sx o/w.  D/w pt about recheck TSH in about 2 months.   Lipid are reasonable, esp given that he is off statin.

## 2014-09-16 NOTE — Assessment & Plan Note (Signed)
He'll stop the ACE and update me.  See AVS.  He agrees.

## 2014-09-19 ENCOUNTER — Telehealth: Payer: Self-pay

## 2014-09-19 NOTE — Telephone Encounter (Signed)
Pt left v/m; pt was seen 09/15/14 for annual exam; lisinopril was stopped and pt was to monitor BP; if BP was > 130/90 pt was to call.  On 09/17/14 BP was 140/90; today BP is 151/106 (pt checked BP x 2). Since stopping the lisinopril the aching has not stopped; but pt cannot be sure if aching caused by lisinopril because pt is seeing a chiropractor for pain. No H/A,dizziness, CP or SOB. [facility].Please advise.

## 2014-09-20 MED ORDER — LISINOPRIL 10 MG PO TABS: 10.0000 mg | ORAL_TABLET | Freq: Every day | ORAL | Status: DC

## 2014-09-20 NOTE — Telephone Encounter (Signed)
No, would still restart the lisinopril.  Thanks.

## 2014-09-20 NOTE — Telephone Encounter (Signed)
Patient advised.

## 2014-09-20 NOTE — Telephone Encounter (Signed)
Restart the lisinopril.  Resent rx.  If the aches didn't improve, then likely not related to the medicine.  Allergy list updated, med removed from allergy list.  I would continue with the chiropractor tx for the aches.  Update me as needed.  Thanks.

## 2014-09-20 NOTE — Telephone Encounter (Signed)
Before providing the instructions below, patient called in with additional BP readings (off Lisinopril) 8/13   140/90 8/14  134/93  113/86 8/15  125/96 8/16  134/93 Does this information change your instructions below?

## 2014-09-21 ENCOUNTER — Other Ambulatory Visit: Payer: Self-pay | Admitting: Family Medicine

## 2014-09-21 NOTE — Telephone Encounter (Signed)
Received refill request electronically from pharmacy. Medication is no longer on medication list. Please advise.

## 2014-09-22 MED ORDER — LOSARTAN POTASSIUM 50 MG PO TABS: 50.0000 mg | ORAL_TABLET | Freq: Every day | ORAL | Status: DC

## 2014-09-22 NOTE — Telephone Encounter (Signed)
I denied it.  Hasn't been on med recently.  Thanks.

## 2014-09-22 NOTE — Telephone Encounter (Signed)
Pt notified of Dr. Lianne Bushy instructions and that Rx has been sent to pharmacy

## 2014-09-22 NOTE — Addendum Note (Signed)
Addended by: Joaquim Nam on: 09/22/2014 08:59 AM   Modules accepted: Orders, Medications

## 2014-09-22 NOTE — Telephone Encounter (Signed)
Pt left v/m; pt restarted lisinopril and now pains and aches have returned; pt had previously taken losartan potassium 50 mg taking one daily and pt does not remember any problems with losartan. Pt request to stop lisinopril and start losartan to [facility]. Pt request cb.

## 2014-09-22 NOTE — Telephone Encounter (Signed)
Sent.  If lightheaded with 50mg  dose of losartan, then cut back to 1/2 tab a day, ie 25mg .  Thanks.

## 2014-10-11 ENCOUNTER — Other Ambulatory Visit: Payer: Self-pay | Admitting: Family Medicine

## 2014-10-11 NOTE — Telephone Encounter (Signed)
Rout to PCP 

## 2014-10-12 NOTE — Telephone Encounter (Signed)
Sent. Thanks.   

## 2014-10-14 ENCOUNTER — Other Ambulatory Visit: Payer: Self-pay

## 2014-10-14 MED ORDER — HYDROCODONE-ACETAMINOPHEN 5-325 MG PO TABS: ORAL_TABLET | ORAL | Status: DC

## 2014-10-14 NOTE — Telephone Encounter (Signed)
Pt left note requesting rx hydrocodone apap. Call when ready for pick up.rx last printed # 60 on 09/07/14 and last seen on 09/15/14 for annual exam.

## 2014-10-14 NOTE — Telephone Encounter (Signed)
Printed.  Thanks.  

## 2014-10-17 NOTE — Telephone Encounter (Signed)
Rx left in front office for pick up and Left detailed msg on VM per HIPAA  

## 2014-10-21 ENCOUNTER — Telehealth: Payer: Self-pay | Admitting: *Deleted

## 2014-10-21 NOTE — Telephone Encounter (Signed)
Received a PA request from pharmacy on Ambien CR. I submitted PA electronically through Covermymeds, and med was approved until 10/21/15. I faxed approval notification to pharmacy, and placed it in your inbox.

## 2014-10-22 NOTE — Telephone Encounter (Signed)
Noted, thanks!

## 2014-10-28 ENCOUNTER — Ambulatory Visit (INDEPENDENT_AMBULATORY_CARE_PROVIDER_SITE_OTHER): Payer: Medicare Other

## 2014-10-28 DIAGNOSIS — Z23 Encounter for immunization: Secondary | ICD-10-CM | POA: Diagnosis not present

## 2014-10-29 ENCOUNTER — Other Ambulatory Visit: Payer: Self-pay | Admitting: Family Medicine

## 2014-10-31 NOTE — Telephone Encounter (Signed)
Sent. Thanks.   

## 2014-11-07 ENCOUNTER — Ambulatory Visit (INDEPENDENT_AMBULATORY_CARE_PROVIDER_SITE_OTHER): Payer: Medicare Other | Admitting: Family Medicine

## 2014-11-07 ENCOUNTER — Encounter: Payer: Self-pay | Admitting: Family Medicine

## 2014-11-07 VITALS — BP 120/84 | HR 64 | Temp 97.9°F | Wt 147.6 lb

## 2014-11-07 DIAGNOSIS — I255 Ischemic cardiomyopathy: Secondary | ICD-10-CM

## 2014-11-07 DIAGNOSIS — M546 Pain in thoracic spine: Secondary | ICD-10-CM

## 2014-11-07 MED ORDER — TRAMADOL HCL 50 MG PO TABS: ORAL_TABLET | ORAL | Status: DC

## 2014-11-07 NOTE — Patient Instructions (Signed)
I would add on tylenol up to 1gram 3 times a day.  Use a tennis ball to massage the muscles.  Update me if not better.  Take care.  Glad to see you.

## 2014-11-07 NOTE — Progress Notes (Signed)
Pre visit review using our clinic review tool, if applicable. No additional management support is needed unless otherwise documented below in the visit note.  B shoulder pain.  Used to get a massage from his wife with relief but she can't do it anymore.  Can get pain radiating down the arm, when he presses on the knot on the back of his neck.  No trauma recently.  H/o R rotator cuff repair x2.  Grip wnl, symmetric.  No weakness.  Taking skelaxin tid with tramadol with some partial relief.   He hasn't taking tylenol consistently, but used it last night with some relief.    Meds, vitals, and allergies reviewed.   ROS: See HPI.  Otherwise, noncontributory.  nad ncat Neck supple, normal ROM rrr ctab Normal ROM B shoulders  Grip and sensation wnl B hands.  Normal radial pulses B Muscle spasms noted medial to B scapula Not ttp in midline of back No rash, no bruise

## 2014-11-08 ENCOUNTER — Encounter: Payer: Self-pay | Admitting: *Deleted

## 2014-11-08 NOTE — Assessment & Plan Note (Signed)
With radicular arm sx and spasms noted  Would add on tylenol up to 1gram 3 times a day.  Use a tennis ball to massage the muscles.  Update me if not better.  He likely has a radicular source with subsequent local spasms, anatomy d/w pt.  D/w pt, w/o weakness or emergent sx then wouldn't image at this point.  He agrees.  Okay for outpatient f/u.

## 2014-11-16 ENCOUNTER — Other Ambulatory Visit: Payer: Medicare Other

## 2014-11-21 ENCOUNTER — Other Ambulatory Visit (INDEPENDENT_AMBULATORY_CARE_PROVIDER_SITE_OTHER): Payer: Medicare Other

## 2014-11-21 DIAGNOSIS — I4891 Unspecified atrial fibrillation: Secondary | ICD-10-CM

## 2014-11-21 LAB — TSH: TSH: 0.43 u[IU]/mL (ref 0.35–4.50)

## 2014-11-23 ENCOUNTER — Telehealth: Payer: Self-pay | Admitting: Family Medicine

## 2014-11-23 NOTE — Telephone Encounter (Signed)
Pt returned your call - he thinks it may be about labs please call [phone] Thank you

## 2014-11-23 NOTE — Telephone Encounter (Signed)
Returned patient telephone call and lab results were given.

## 2014-11-27 ENCOUNTER — Other Ambulatory Visit: Payer: Self-pay | Admitting: Family Medicine

## 2014-11-28 NOTE — Telephone Encounter (Signed)
Received refill request electronically Last office visit 11/07/14 Is it okay to refill?

## 2014-11-29 NOTE — Telephone Encounter (Signed)
Sent. Thanks.   

## 2014-12-12 ENCOUNTER — Other Ambulatory Visit: Payer: Self-pay | Admitting: *Deleted

## 2014-12-12 NOTE — Telephone Encounter (Signed)
Faxed refill request. Last Filled:    90 tablet 0 Rf on 10/12/2014  Last office visit:   11/07/14.  Please advise.

## 2014-12-13 MED ORDER — METAXALONE 800 MG PO TABS: ORAL_TABLET | ORAL | Status: DC

## 2014-12-13 NOTE — Telephone Encounter (Signed)
Sent. Thanks.   

## 2014-12-19 ENCOUNTER — Other Ambulatory Visit: Payer: Self-pay | Admitting: *Deleted

## 2014-12-19 MED ORDER — METAXALONE 800 MG PO TABS
ORAL_TABLET | ORAL | Status: DC
Start: 2014-12-19 — End: 2015-05-31

## 2014-12-19 NOTE — Telephone Encounter (Signed)
Refill done last week on `12/13/14 [facility] and patient requests [place].  [facility] cancelled and sent to [facility].

## 2014-12-28 ENCOUNTER — Encounter: Payer: Medicare Other | Admitting: Internal Medicine

## 2015-01-04 ENCOUNTER — Ambulatory Visit (INDEPENDENT_AMBULATORY_CARE_PROVIDER_SITE_OTHER): Payer: Medicare Other | Admitting: Family Medicine

## 2015-01-04 ENCOUNTER — Encounter: Payer: Self-pay | Admitting: Family Medicine

## 2015-01-04 VITALS — BP 102/68 | HR 64 | Temp 97.8°F | Wt 137.5 lb

## 2015-01-04 DIAGNOSIS — M791 Myalgia, unspecified site: Secondary | ICD-10-CM

## 2015-01-04 DIAGNOSIS — I255 Ischemic cardiomyopathy: Secondary | ICD-10-CM | POA: Diagnosis not present

## 2015-01-04 DIAGNOSIS — I5022 Chronic systolic (congestive) heart failure: Secondary | ICD-10-CM | POA: Diagnosis not present

## 2015-01-04 DIAGNOSIS — R112 Nausea with vomiting, unspecified: Secondary | ICD-10-CM

## 2015-01-04 MED ORDER — LOSARTAN POTASSIUM 50 MG PO TABS: 25.0000 mg | ORAL_TABLET | Freq: Every day | ORAL | Status: DC

## 2015-01-04 NOTE — Patient Instructions (Signed)
I think the positional lightheadedness may be from the BP medicine.  I would cut the losartan in half, down to 25mg  a day and see if that helps.  I still think you have muscle spasms in your back, occuring episodically.  The vomiting seems to be an incidental issue.  If recurrent, then let me know.  Update me as needed. Take care.

## 2015-01-04 NOTE — Progress Notes (Signed)
Pre visit review using our clinic review tool, if applicable. No additional management support is needed unless otherwise documented below in the visit note.  Less muscle aches off lisinopril.  No ADE on ARB.   Was at home recently, had nausea that came on suddenly.  Vomited mult times (this was about 1-2 weeks ago).  No clear trigger, no abnormal foods.  No other sick contacts.  Vomitus was clear.  He didn't know if he could attribute the episode to post nasal gtt/mucous.   Some chills prev, not now.  No fevers.  No diarrhea.    Has B rib pain, B lower ribs.  No central chest pain.  Pain is positional, esp R side, more pain laying down.  Some relief with pain meds.  Had been going on before the vomiting, since last OV but the reported conversation prev at last OV was about radicular arm pain prev, but that is better than prev.  Still with some pain- likely spasm- in the upper back, near the R shoulder blade that tends to happen in the afternoon.    He is occ, chronically lightheaded with position changes.  Longstanding.    He does have heartburn and some voice changes. GERD sx improve with zantac prn.    Meds, vitals, and allergies reviewed.   ROS: See HPI.  Otherwise, noncontributory.  GEN: nad, alert and oriented HEENT: mucous membranes moist, OP wnl NECK: supple w/o LA CV: rrr.  PULM: ctab, no inc wob, ribs not ttp ABD: soft, +bs, abd not ttp, no rebound EXT: no edema SKIN: no acute rash Muscles tight medial to R scapula

## 2015-01-05 DIAGNOSIS — R112 Nausea with vomiting, unspecified: Secondary | ICD-10-CM | POA: Insufficient documentation

## 2015-01-05 NOTE — Assessment & Plan Note (Signed)
Likely overtreated with ARB, will cut losartan to 25mg  a day.  Update me as needed.  D/w pt. He agrees.

## 2015-01-05 NOTE — Assessment & Plan Note (Signed)
Resolved now, benign exam, could have been from postnasal gtt, either way w/o sx now and would only observe.  He agrees.  Could have had anterior abd wall pain from strain from vomiting.  Resolved now, not ttp on exam.

## 2015-01-05 NOTE — Assessment & Plan Note (Signed)
Still with R upper back spasm but improved from prev.

## 2015-01-10 ENCOUNTER — Telehealth: Payer: Self-pay | Admitting: *Deleted

## 2015-01-10 NOTE — Telephone Encounter (Signed)
Patient called stating that his Losartan has been decreased from 50 mg to 25 mg daily. Patient stated that his BP has been 102/97 and 119/91. Patient wants to know if he needs to make an adjustment on his medication again?

## 2015-01-10 NOTE — Telephone Encounter (Signed)
If he isn't lightheaded, then I would continue as is.  Thanks.

## 2015-01-10 NOTE — Telephone Encounter (Signed)
Left detailed message on voicemail.  

## 2015-01-16 ENCOUNTER — Other Ambulatory Visit: Payer: Self-pay | Admitting: Family Medicine

## 2015-01-16 NOTE — Telephone Encounter (Signed)
Electronic refill request. Last Filled:    30 tablet 3 09/16/2014  CPE 09/15/14  Please advise.

## 2015-01-17 ENCOUNTER — Encounter: Payer: Self-pay | Admitting: Family Medicine

## 2015-01-17 ENCOUNTER — Telehealth: Payer: Self-pay | Admitting: Family Medicine

## 2015-01-17 ENCOUNTER — Ambulatory Visit (INDEPENDENT_AMBULATORY_CARE_PROVIDER_SITE_OTHER): Payer: Medicare Other | Admitting: Family Medicine

## 2015-01-17 VITALS — BP 102/80 | HR 84 | Temp 97.6°F | Wt 138.0 lb

## 2015-01-17 DIAGNOSIS — R112 Nausea with vomiting, unspecified: Secondary | ICD-10-CM | POA: Diagnosis not present

## 2015-01-17 DIAGNOSIS — I255 Ischemic cardiomyopathy: Secondary | ICD-10-CM | POA: Diagnosis not present

## 2015-01-17 MED ORDER — HYDROCODONE-ACETAMINOPHEN 5-325 MG PO TABS: ORAL_TABLET | ORAL | Status: DC

## 2015-01-17 MED ORDER — CHOLESTYRAMINE 4 G PO PACK: 4.0000 g | PACK | Freq: Every day | ORAL | Status: DC

## 2015-01-17 MED ORDER — LOSARTAN POTASSIUM 50 MG PO TABS: 50.0000 mg | ORAL_TABLET | Freq: Every day | ORAL | Status: DC

## 2015-01-17 NOTE — Telephone Encounter (Signed)
rx done, given to [facility] to give to patient.  Thanks.

## 2015-01-17 NOTE — Telephone Encounter (Signed)
Pt requests hydrocodone refill. Please advise

## 2015-01-17 NOTE — Telephone Encounter (Signed)
Medication phoned to pharmacy.  

## 2015-01-17 NOTE — Progress Notes (Signed)
Pre visit review using our clinic review tool, if applicable. No additional management support is needed unless otherwise documented below in the visit note.  Has been episodically vomiting.  Was prev clear, now recently was bilious.  He felt hungry and at some noodles and crackers but couldn't tolerate it.  Still with episodic diarrhea with fecal urgency.  He has had episodically since his GB surgery.  No blood in stool.  He'll get some occ RUQ pain.  Some episodic nausea.    He had to cancel some travel plans due to the unpredictability of his GI sx.  He has been taking about 1 meal a day with some occ snacks.    Taking zantac prn for epigastric pain with some occ help from that.    Lightheadedness resolved back on 50mg  losartan.    Meds, vitals, and allergies reviewed.   ROS: See HPI.  Otherwise, noncontributory.  nad ncat Neck supple rrr ctab abd soft, normal BS, not ttp Has RUQ pain but not affected with palpation, no rebound.

## 2015-01-17 NOTE — Assessment & Plan Note (Signed)
With diarrhea.  Possible bile salt issue given the prev GB surgery.  CT prev with: the liver enhances with no focal abnormality and no ductal dilatation is seen. Surgical clips are present from prior cholecystectomy. The pancreas is normal in size and the pancreatic duct is not dilated. Not an acute abd.  Patient's sx are longstanding but episodic, inc in frequency now.   D/w pt.  Reasonable to try questran for now, with largest meal of the day and he'll check about co-admin of other meds with pharmacy.  Update me next week.  He agrees.

## 2015-01-17 NOTE — Patient Instructions (Signed)
Try taking cholestyramine once a day with your biggest meal.   Ask the pharmacy about the timing of that med and your other meds.   Update me in about 1 week. Take care, glad to see you.

## 2015-01-17 NOTE — Telephone Encounter (Signed)
Pt picked up rx. Thanks

## 2015-02-17 ENCOUNTER — Other Ambulatory Visit: Payer: Self-pay | Admitting: Family Medicine

## 2015-02-17 NOTE — Telephone Encounter (Signed)
Last filled #100 x1 refill at 11/07/14 ov for shoulder pain.  Okay to refill?

## 2015-02-19 NOTE — Telephone Encounter (Signed)
Please call in.  Thanks.   

## 2015-02-20 NOTE — Telephone Encounter (Signed)
Rx called to pharmacy as instructed. 

## 2015-02-21 ENCOUNTER — Other Ambulatory Visit: Payer: Self-pay | Admitting: Family Medicine

## 2015-02-22 NOTE — Telephone Encounter (Signed)
Received refill request electronically Last office visit 01/16/14 Last refill 08/17/13 #30 Is it okay to refill?

## 2015-02-22 NOTE — Telephone Encounter (Signed)
Noted.  If his sx get worse, then let me know.  Thanks.  Took Lanetta Inch off the med list.

## 2015-02-22 NOTE — Telephone Encounter (Signed)
Spoke to patient and was advised that he does not see any difference taking the [place]. Patient stated that he is back using imodium when needed and feels that it works better than the [place].

## 2015-02-22 NOTE — Telephone Encounter (Signed)
Left message on voice mail  to call back

## 2015-02-22 NOTE — Telephone Encounter (Addendum)
Last OV 01/17/15.  How is he doing with the questran added on?  Let me know.  Sent.  Thanks.

## 2015-02-23 NOTE — Telephone Encounter (Signed)
Patient advised.

## 2015-03-14 ENCOUNTER — Encounter: Payer: Self-pay | Admitting: *Deleted

## 2015-03-20 ENCOUNTER — Encounter: Payer: Self-pay | Admitting: Family Medicine

## 2015-03-20 ENCOUNTER — Ambulatory Visit (INDEPENDENT_AMBULATORY_CARE_PROVIDER_SITE_OTHER): Payer: Medicare Other | Admitting: Family Medicine

## 2015-03-20 VITALS — BP 112/78 | HR 86 | Temp 97.3°F | Wt 137.0 lb

## 2015-03-20 DIAGNOSIS — J0100 Acute maxillary sinusitis, unspecified: Secondary | ICD-10-CM

## 2015-03-20 MED ORDER — DOXYCYCLINE HYCLATE 100 MG PO TABS: 100.0000 mg | ORAL_TABLET | Freq: Two times a day (BID) | ORAL | Status: DC

## 2015-03-20 NOTE — Patient Instructions (Signed)
Start doxycycline today and update me if not better.  If you still have GI symptoms after this, then we should refer you.  Take care.  Glad to see you.

## 2015-03-20 NOTE — Progress Notes (Signed)
Pre visit review using our clinic review tool, if applicable. No additional management support is needed unless otherwise documented below in the visit note.  He'll notice a horizontal ridge develop on the upper midline abdomen, above the umbilicus. Noted initially several months ago.  Not tender, not painful.    Still with some vomiting.  Vomitus is still clear.   With diarrhea. Possible bile salt issue given the prev GB surgery but questran didn't help.  Still with diarrhea.  Imodium helps some with diarrhea.   CT prev: the liver enhances with no focal abnormality and no ductal dilatation is seen. Surgical clips are present from prior cholecystectomy. The pancreas is normal in size and the pancreatic duct is not dilated. Still with need to clear mucous in the throat, some better with zantac and pepto, some day w/o sx or need for med at all.    More recently with sinus congestion and upper airway irritation and that may be adding on to his GI sx.  No blood in stool.  No fevers. occ chills recently.    Meds, vitals, and allergies reviewed.   ROS: See HPI.  Otherwise, noncontributory.  GEN: nad, alert and oriented HEENT: mucous membranes moist, tm w/o erythema, nasal exam w/o erythema, clear discharge noted,  OP with cobblestoning, R max sinus ttp.  NECK: supple w/o LA CV: rrr.   PULM: ctab, no inc wob EXT: no edema SKIN: no acute rash ABD soft, not ttp, no masses noted.

## 2015-03-23 DIAGNOSIS — J0100 Acute maxillary sinusitis, unspecified: Secondary | ICD-10-CM | POA: Insufficient documentation

## 2015-03-23 NOTE — Assessment & Plan Note (Signed)
Would treat at this point.  Unclear if postnasal gtt and mucous contributing to GI sx.  If GI sx continue after treatment, then we should refer to GI, d/w pt and he agrees.  Okay for outpatient f/u.

## 2015-04-20 ENCOUNTER — Other Ambulatory Visit: Payer: Self-pay | Admitting: Family Medicine

## 2015-04-20 NOTE — Telephone Encounter (Signed)
Electronic refill request. Last Filled:    45 tablet 0 06/03/2014  Last office visit:   03/20/15  Please advise.

## 2015-04-20 NOTE — Telephone Encounter (Signed)
Please call in.  Thanks.   

## 2015-04-20 NOTE — Telephone Encounter (Signed)
Medication phoned to pharmacy.  

## 2015-05-09 ENCOUNTER — Encounter: Payer: Medicare Other | Admitting: Internal Medicine

## 2015-05-10 ENCOUNTER — Telehealth: Payer: Self-pay | Admitting: Family Medicine

## 2015-05-10 NOTE — Telephone Encounter (Signed)
Pt called stating he passed out at [employer] in [place]. I spoke with rena she stated to tell pt to go to urgent care or er. Pt stated he was going to urgent care

## 2015-05-10 NOTE — Telephone Encounter (Signed)
Noted. Thanks.

## 2015-05-10 NOTE — Telephone Encounter (Signed)
Pt called back said emt checked him out at [employer] he drove himself home i asked if he went to urgent care.  He stated he didn't think he needed to  He made an appointment with dr Ermalene Searing tomorrow morning

## 2015-05-11 NOTE — Telephone Encounter (Signed)
Pt came in for appt and when Robin scheduled appt the computer jumped from 05/11/15 to 05/12/15 and pt appt is 05/12/15 at 9:30 with Dr Ermalene Searing; no available appts now; when pt came in today to ck in Dr Reece Agar had an appt this afternoon but pt advised Zella Ball that he did not want to see Dr Reece Agar. I spoke with Dr Para March and advised pt if condition changes or worsens prior to appt on 05/12/15 to go to [facility] or ED for evalulation otherwise keep appt on 05/12/15. Pt voiced understanding and pt said he feels fine now. FYI to Dr Para March.

## 2015-05-12 ENCOUNTER — Other Ambulatory Visit (INDEPENDENT_AMBULATORY_CARE_PROVIDER_SITE_OTHER): Payer: Medicare Other

## 2015-05-12 ENCOUNTER — Ambulatory Visit (INDEPENDENT_AMBULATORY_CARE_PROVIDER_SITE_OTHER): Payer: Medicare Other | Admitting: Family Medicine

## 2015-05-12 ENCOUNTER — Encounter: Payer: Self-pay | Admitting: Family Medicine

## 2015-05-12 VITALS — BP 93/64 | HR 70 | Temp 97.5°F | Ht 63.0 in | Wt 138.5 lb

## 2015-05-12 DIAGNOSIS — Z95810 Presence of automatic (implantable) cardiac defibrillator: Secondary | ICD-10-CM

## 2015-05-12 DIAGNOSIS — I959 Hypotension, unspecified: Secondary | ICD-10-CM

## 2015-05-12 DIAGNOSIS — I482 Chronic atrial fibrillation, unspecified: Secondary | ICD-10-CM

## 2015-05-12 DIAGNOSIS — E0590 Thyrotoxicosis, unspecified without thyrotoxic crisis or storm: Secondary | ICD-10-CM

## 2015-05-12 DIAGNOSIS — I2510 Atherosclerotic heart disease of native coronary artery without angina pectoris: Secondary | ICD-10-CM

## 2015-05-12 DIAGNOSIS — R001 Bradycardia, unspecified: Secondary | ICD-10-CM | POA: Insufficient documentation

## 2015-05-12 DIAGNOSIS — I5022 Chronic systolic (congestive) heart failure: Secondary | ICD-10-CM

## 2015-05-12 DIAGNOSIS — R55 Syncope and collapse: Secondary | ICD-10-CM | POA: Insufficient documentation

## 2015-05-12 LAB — COMPREHENSIVE METABOLIC PANEL
ALBUMIN: 3.8 g/dL (ref 3.5–5.2)
ALK PHOS: 67 U/L (ref 39–117)
ALT: 14 U/L (ref 0–53)
AST: 20 U/L (ref 0–37)
BUN: 19 mg/dL (ref 6–23)
CHLORIDE: 104 meq/L (ref 96–112)
CO2: 30 mEq/L (ref 19–32)
Calcium: 8.9 mg/dL (ref 8.4–10.5)
Creatinine, Ser: 1.09 mg/dL (ref 0.40–1.50)
GFR: 70.89 mL/min (ref >60.00)
Glucose, Bld: 100 mg/dL — ABNORMAL HIGH (ref 70–99)
POTASSIUM: 4.2 meq/L (ref 3.5–5.1)
SODIUM: 139 meq/L (ref 135–145)
TOTAL PROTEIN: 6.5 g/dL (ref 6.0–8.3)
Total Bilirubin: 0.9 mg/dL (ref 0.2–1.2)

## 2015-05-12 LAB — CBC WITH DIFFERENTIAL/PLATELET
BASOS PCT: 1.1 % (ref 0.0–3.0)
Basophils Absolute: 0.1 10*3/uL (ref 0.0–0.1)
EOS PCT: 2.9 % (ref 0.0–5.0)
Eosinophils Absolute: 0.2 10*3/uL (ref 0.0–0.7)
HCT: 42.5 % (ref 39.0–52.0)
HEMOGLOBIN: 14.1 g/dL (ref 13.0–17.0)
LYMPHS ABS: 1.8 10*3/uL (ref 0.7–4.0)
Lymphocytes Relative: 22.8 % (ref 12.0–46.0)
MCHC: 33.2 g/dL (ref 30.0–36.0)
MCV: 90.9 fl (ref 78.0–100.0)
MONO ABS: 0.7 10*3/uL (ref 0.1–1.0)
MONOS PCT: 8.8 % (ref 3.0–12.0)
NEUTROS PCT: 64.4 % (ref 43.0–77.0)
Neutro Abs: 5.2 10*3/uL (ref 1.4–7.7)
Platelets: 221 10*3/uL (ref 150.0–400.0)
RBC: 4.68 Mil/uL (ref 4.22–5.81)
RDW: 14.5 % (ref 11.5–15.5)
WBC: 8.1 10*3/uL (ref 4.0–10.5)

## 2015-05-12 LAB — T3, FREE: T3, Free: 3.1 pg/mL (ref 2.3–4.2)

## 2015-05-12 LAB — TSH: TSH: 0.16 u[IU]/mL — AB (ref 0.35–4.50)

## 2015-05-12 LAB — T4, FREE: Free T4: 1.06 ng/dL (ref 0.60–1.60)

## 2015-05-12 MED ORDER — CARVEDILOL 6.25 MG PO TABS: 6.2500 mg | ORAL_TABLET | Freq: Two times a day (BID) | ORAL | Status: DC

## 2015-05-12 NOTE — Progress Notes (Signed)
Subjective:    Patient ID: [name], male    DOB: [birth year], 70 y.o.   MRN: [id number]  HPI 70 year old male pt pf Dr. Lianne Bushy with history of  Atrial fibrillation, COPD, CAD, systolic CHF, ischemic cardiomyopathy, anxiety presents following syncopal event 2 days ago at [employer].   He reports he was in his nml state of health until standing in line at check out, short time. He had poptart 3-4 hours earlier, had been drinking water.  Felt lightheaded. No vertigo. Closed eyes, slid down candy rack, and sat in floor.  Dagoberto Reef said he LOC for 3 seconds. Immediately went back to normal state, no residual fatigue. Pulse was nml. and regular. Defib did not go off. No pother proceeding symptoms like SOB, CP, palpitations, skipped beats.  Since then he has felt great, no issues.   BPs not checked lately. No new medications.  On coreg,  Losartan, digoxin,lasix only as needed.. Took 3-4 days ago. Had not taken pain med or muslce relaxant. Had used ambien  and pain meds the night before.  BP Readings from Last 3 Encounters:  05/12/15 93/64  03/20/15 112/78  01/17/15 102/80   Wt Readings from Last 3 Encounters:  05/12/15 138 lb 8 oz (62.823 kg)  03/20/15 137 lb (62.143 kg)  01/17/15 138 lb (62.596 kg)   He sees Dr. [name] for cardiology.  Social History /Family History/Past Medical History reviewed and updated if needed.    Review of Systems  Constitutional: Negative for fever and fatigue.  HENT: Negative for ear pain.   Eyes: Negative for pain.  Respiratory: Negative for cough and shortness of breath.   Cardiovascular: Negative for chest pain.  Gastrointestinal: Negative for abdominal pain.  Genitourinary: Negative for dysuria.       Objective:   Physical Exam  Constitutional: He is oriented to person, place, and time. Vital signs are normal. He appears well-developed and well-nourished.  Thin appearing in NAD  HENT:  Head: Normocephalic.  Right Ear: Hearing  normal.  Left Ear: Hearing normal.  Nose: Nose normal.  Mouth/Throat: Oropharynx is clear and moist and mucous membranes are normal.  Neck: Trachea normal. Carotid bruit is not present. No thyroid mass and no thyromegaly present.  Cardiovascular: Normal pulses.  An irregularly irregular rhythm present. Bradycardia present.  Exam reveals no gallop, no distant heart sounds and no friction rub.   No murmur heard. No peripheral edema  Pulmonary/Chest: Effort normal and breath sounds normal. No respiratory distress.  Abdominal: There is hepatosplenomegaly. There is no tenderness. There is no CVA tenderness.  Neurological: He is oriented to person, place, and time. He has normal strength. No cranial nerve deficit or sensory deficit. Coordination and gait normal.  Skin: Skin is warm, dry and intact. No rash noted.  Psychiatric: He has a normal mood and affect. His speech is normal and behavior is normal. Thought content normal.          Assessment & Plan:   Syncopal event in pt with extensive cardiac history including CAD, CHF, ischemic cardiomyopathy, AFib.  EKG today shows bradycardia but no other clear changes.  Bps are low.  Will have pt hold diuresis ( likely fluid depleted) increase fluids some.  Decrease coreg to 6.25 mg BID.  Follow BP and pulse.  Pt to have appt with cardiologist.. Now moved earlier to 4/10 for further recs.  Will also eval with labs to rule out other sources of syncope and bradycardia. No infectious symptoms.

## 2015-05-12 NOTE — Progress Notes (Signed)
Pre visit review using our clinic review tool, if applicable. No additional management support is needed unless otherwise documented below in the visit note. 

## 2015-05-12 NOTE — Patient Instructions (Addendum)
Stop at front desk to set up cardiology appointment. Increase fluids some. Hold lasix for now. Decrease coreg to 6.25 mg twice daily. Stop at lab on way out.

## 2015-05-15 ENCOUNTER — Encounter: Payer: Self-pay | Admitting: Internal Medicine

## 2015-05-15 ENCOUNTER — Ambulatory Visit (INDEPENDENT_AMBULATORY_CARE_PROVIDER_SITE_OTHER): Payer: Medicare Other | Admitting: Internal Medicine

## 2015-05-15 VITALS — BP 116/64 | HR 70 | Ht 63.0 in | Wt 137.8 lb

## 2015-05-15 DIAGNOSIS — I5022 Chronic systolic (congestive) heart failure: Secondary | ICD-10-CM

## 2015-05-15 DIAGNOSIS — I472 Ventricular tachycardia, unspecified: Secondary | ICD-10-CM

## 2015-05-15 DIAGNOSIS — I2510 Atherosclerotic heart disease of native coronary artery without angina pectoris: Secondary | ICD-10-CM

## 2015-05-15 LAB — CUP PACEART INCLINIC DEVICE CHECK
Battery Voltage: 2.98 V
Brady Statistic RV Percent Paced: 2.38 %
Date Time Interrogation Session: 20170410175242
HIGH POWER IMPEDANCE MEASURED VALUE: 58 Ohm
HighPow Impedance: 48 Ohm
Implantable Lead Location: 753860
Lead Channel Impedance Value: 342 Ohm
Lead Channel Impedance Value: 437 Ohm
Lead Channel Pacing Threshold Amplitude: 0.75 V
Lead Channel Pacing Threshold Pulse Width: 0.4 ms
Lead Channel Sensing Intrinsic Amplitude: 5.125 mV
Lead Channel Setting Pacing Pulse Width: 0.4 ms
Lead Channel Setting Sensing Sensitivity: 0.3 mV
MDC IDC LEAD IMPLANT DT: 20040722
MDC IDC MSMT BATTERY REMAINING LONGEVITY: 111 mo
MDC IDC SET LEADCHNL RV PACING AMPLITUDE: 2.5 V

## 2015-05-15 MED ORDER — AMIODARONE HCL 200 MG PO TABS: 200.0000 mg | ORAL_TABLET | Freq: Every day | ORAL | Status: DC

## 2015-05-15 NOTE — Progress Notes (Signed)
HPI [name] returns today for followup. He is a 70 year old man with an ischemic cardiomyopathy, chronic systolic heart failure, class III, chronic atrial fibrillation, unwilling to take any anticoagulation, ventricular tachycardia, status post ICD implantation. He presents today with a h/o 2 ICD shocks, the last associated with syncope and ICD interogation demonstrates VT at 200/min. He did not feel palpitations before the episode.   Allergies  Allergen Reactions  . Ace Inhibitors Other (See Comments)    muscle pain. Tolerates ARBs.   . Codeine Other (See Comments)    "head wants to explode."  . Penicillins Swelling    "started at point of injection; w/in 3 min my upper arm was swollen 3 times normal"  . Lisinopril     Muscle Pain  . Statins Other (See Comments)    Myalgias per patient     Current Outpatient Prescriptions  Medication Sig Dispense Refill  . albuterol (PROVENTIL HFA;VENTOLIN HFA) 108 (90 BASE) MCG/ACT inhaler Inhale 2 puffs into the lungs every 6 (six) hours as needed. As needed for shortness of breath.    [name] aspirin 325 MG EC tablet Take 325 mg by mouth daily.    . benzonatate (TESSALON) 200 MG capsule TAKE 1 CAPSULE BY MOUTH 3 TIMES DAILY AS NEEDED FOR COUGH 30 capsule 1  . carvedilol (COREG) 6.25 MG tablet Take 1 tablet (6.25 mg total) by mouth 2 (two) times daily with a meal. 60 tablet 11  . diazepam (VALIUM) 5 MG tablet TAKE 1 TABLET BY MOUTH EVERY 6 HOURS AS NEEDED 45 tablet 0  . diclofenac sodium (VOLTAREN) 1 % GEL Apply 1 application topically 3 (three) times daily. 1 Tube 3  . digoxin (LANOXIN) 0.125 MG tablet TAKE 1 TABLET BY MOUTH EVERY DAY 90 tablet 3  . fexofenadine (ALLEGRA) 180 MG tablet Take 180 mg by mouth daily as needed for allergies or rhinitis.    . furosemide (LASIX) 20 MG tablet TAKE 1 TABLET BY MOUTH FOR 3 DAYS THEN AS NEEDED 90 tablet 3  . HYDROcodone-acetaminophen (NORCO/VICODIN) 5-325 MG tablet TAKE 1 TABLET BY MOUTH EVERY 6 HOURS AS NEEDED FOR  PAIN 60 tablet 0  . losartan (COZAAR) 50 MG tablet Take 1 tablet (50 mg total) by mouth daily.    . metaxalone (SKELAXIN) 800 MG tablet TAKE 1 TABLET BY MOUTH 3 TIMES A DAY AS NEEDED FOR PAIN. 90 tablet 1  . Multiple Vitamins-Minerals (MULTIVITAMIN ADULTS 50+) TABS Take 1 tablet by mouth daily.    . nitroGLYCERIN (NITROSTAT) 0.4 MG SL tablet Place 1 tablet (0.4 mg total) under the tongue every 5 (five) minutes as needed. For chest pain. 30 tablet 1  . promethazine (PHENERGAN) 25 MG tablet TAKE 1 TABLET BY MOUTH EVERY 6 HOURS AS NEEDED FOR NAUSEA/VOMITING 30 tablet 1  . ranitidine (ZANTAC) 150 MG capsule Take 150 mg by mouth every evening.    . traMADol (ULTRAM) 50 MG tablet TAKE 1 TO 2 TABLETS BY MOUTH EVERY 8 HOURS AS NEEDED 100 tablet 1  . zolpidem (AMBIEN CR) 12.5 MG CR tablet TAKE 1 TABLET BY MOUTH AT BEDTIME 30 tablet 5  . amiodarone (PACERONE) 200 MG tablet Take 1 tablet (200 mg total) by mouth daily. 90 tablet 3   No current facility-administered medications for this visit.   Facility-Administered Medications Ordered in Other Visits  Medication Dose Route Frequency Provider Last Rate Last Dose  . 0.45 % sodium chloride infusion   Intravenous Continuous Marinus Maw, MD      .  0.9 %  sodium chloride infusion  250 mL Intravenous Continuous Marinus Maw, MD      . chlorhexidine (HIBICLENS) 4 % liquid 4 application  60 mL Topical Once Marinus Maw, MD      . sodium chloride 0.9 % injection 3 mL  3 mL Intravenous Q12H Marinus Maw, MD      . sodium chloride 0.9 % injection 3 mL  3 mL Intravenous PRN Marinus Maw, MD         Past Medical History  Diagnosis Date  . ICD (implantable cardiac defibrillator) in place   . CAD (coronary artery disease), autologous vein bypass graft   . Allergic rhinitis, cause unspecified   . HLD (hyperlipidemia)   . Diverticulosis     by CT scan  . Pacemaker   . Paroxysmal ventricular tachycardia (HCC)   . Atrial fibrillation (HCC)   . Acute  myocardial infarction, unspecified site, episode of care unspecified 1998  . Anginal pain (HCC) 1998  . Acute lower GI bleeding 12/11/2011    "first time" (12/11/2011)  . Congestive heart failure, unspecified     Reports EF of 26%.   . Arthritis     knees, back  . Insomnia   . Anxiety   . Cervical herniated disc     told not to lift >10 lbs  . COPD (chronic obstructive pulmonary disease) (HCC) 11/2012    by xray  . Perennial allergic rhinitis     only to dust mites    ROS:   All systems reviewed and negative except as noted in the HPI.   Past Surgical History  Procedure Laterality Date  . Cardiac defibrillator placement  2004  . Tonsillectomy and adenoidectomy  ~ 1951  . Cholecystectomy  1/ 2012  . Shoulder arthroscopy w/ rotator cuff repair  twice    right (12/11/2011)  . Coronary angioplasty  1998, ? 2000's  . Partial knee arthroplasty  ~ 2000    left  . Insert / replace / remove pacemaker      medtronic  . Colonoscopy  01/08/2012    Procedure: COLONOSCOPY;  Surgeon: Iva Boop, MD;  Location: [facility];  Service: Endoscopy;  Laterality: N/A;  . Cataract extraction Right 01/2012  . Implantable cardioverter defibrillator generator change N/A 02/07/2012    Procedure: IMPLANTABLE CARDIOVERTER DEFIBRILLATOR GENERATOR CHANGE;  Surgeon: Marinus Maw, MD;  Location[facility] CATH LAB;  Service: Cardiovascular;  Laterality: N/A;     Family History  Problem Relation Age of Onset  . Diabetes Father   . Tracheal cancer Father 15    smoker  . Stroke Mother   . Cancer Sister     left eye  . CAD Neg Hx   . Colon cancer Neg Hx   . Prostate cancer Neg Hx      Social History   Social History  . Marital Status: Married    Spouse Name: N/A  . Number of Children: 0  . Years of Education: N/A   Occupational History  . 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 Smoking status: Current Every Day Smoker -- 0.25 packs/day for 50 years    Types: Cigarettes,  Cigars  . Smokeless tobacco: Never Used     Comment: ~ 8 cigarettes daily  . Alcohol Use: No  . Drug Use: No  . Sexual Activity: No   Other Topics Concern  . Not on file   Social History  Narrative   Lives with wife, married 1998   Grown children, 2 great grandchildren   Occupation: retired, was Presenter, broadcasting   Activity: walking, fishing   Diet: good water daily, fruits/vegetables rare      Wife is Product manager.    [id number], Human resources officer. No known agent orange exposure.       BP 116/64 mmHg  Pulse 70  Ht 5\' 3"  (1.6 m)  Wt 137 lb 12.8 oz (62.506 kg)  BMI 24.42 kg/m2  SpO2 97%  Physical Exam:  Chronically ill appearing NAD HEENT: Unremarkable Neck:  6 cm JVD, no thyromegally Lungs:  Clear except for rales in the bases. No wheezes or rhonchi. Well-healed ICD incision. HEART:  IRegular rate rhythm, no murmurs, no rubs, no clicks Abd:  soft, positive bowel sounds, no organomegally, no rebound, no guarding Ext:  2 plus pulses, no edema, no cyanosis, no clubbing Skin:  No rashes no nodules Neuro:  CN II through XII intact, motor grossly intact   DEVICE  Normal device function.  See PaceArt for details. Optivol is stable.  Assess/Plan:  1. VT - he has had 2 very fast episodes in less than 3 months. I have discussed the treatment options and will start amio 200 mg daily.  2. Chronic systolic heart failure - his symptoms are controlled. No change in meds. 3. HTN - his blood pressure is well controlled. Will follow. 4. ICD - his Medtronic device is working normally. Will recheck in several months.  Leonia Reeves.D.

## 2015-05-15 NOTE — Patient Instructions (Signed)
Medication Instructions:  Your physician has recommended you make the following change in your medication:  1) Start Amiodarone 200 mg daily     Labwork: None ordered   Testing/Procedures: None ordered   Follow-Up:   Your physician recommends that you schedule a follow-up appointment in: 4 months with Dr [name]   Remote monitoring is used to monitor your  ICD from home. This monitoring reduces the number of office visits required to check your device to one time per year. It allows [place] to keep an eye on the functioning of your device to ensure it is working properly. You are scheduled for a device check from home on 08/14/15. You may send your transmission at any time that day. If you have a wireless device, the transmission will be sent automatically. After your physician reviews your transmission, you will receive a postcard with your next transmission date.    Any Other Special Instructions Will Be Listed Below (If Applicable).     If you need a refill on your cardiac medications before your next appointment, please call your pharmacy.

## 2015-05-17 ENCOUNTER — Telehealth: Payer: Self-pay | Admitting: *Deleted

## 2015-05-17 NOTE — Telephone Encounter (Signed)
Called patient to advise him that his home monitor transmission came through and that it should be automatic from this point on.  Advised that if he has any questions or concerns, he should call us back.  Patient verbalizes understanding and denies questions or concerns at this time.

## 2015-05-17 NOTE — Telephone Encounter (Signed)
calling about drug interaction between amio & dig, please call her, thanks

## 2015-05-18 MED ORDER — DIGOXIN 125 MCG PO TABS
ORAL_TABLET | ORAL | Status: DC
Start: 2015-05-18 — End: 2015-05-28

## 2015-05-18 NOTE — Telephone Encounter (Signed)
Patient aware.

## 2015-05-18 NOTE — Telephone Encounter (Addendum)
Discussed with Dr [name] and will have patient take Digoxin Mon- Fri only.  [facility] aware.  I have tried to reach patient but only gotten voicemail

## 2015-05-22 ENCOUNTER — Encounter: Payer: Self-pay | Admitting: Internal Medicine

## 2015-05-22 ENCOUNTER — Telehealth: Payer: Self-pay | Admitting: *Deleted

## 2015-05-23 ENCOUNTER — Encounter: Payer: Self-pay | Admitting: Family Medicine

## 2015-05-23 ENCOUNTER — Ambulatory Visit (INDEPENDENT_AMBULATORY_CARE_PROVIDER_SITE_OTHER): Payer: Medicare Other | Admitting: Family Medicine

## 2015-05-23 VITALS — BP 92/58 | HR 72 | Temp 98.0°F | Wt 137.0 lb

## 2015-05-23 DIAGNOSIS — J0100 Acute maxillary sinusitis, unspecified: Secondary | ICD-10-CM

## 2015-05-23 DIAGNOSIS — J0110 Acute frontal sinusitis, unspecified: Secondary | ICD-10-CM

## 2015-05-23 DIAGNOSIS — I2510 Atherosclerotic heart disease of native coronary artery without angina pectoris: Secondary | ICD-10-CM | POA: Diagnosis not present

## 2015-05-23 DIAGNOSIS — R55 Syncope and collapse: Secondary | ICD-10-CM | POA: Diagnosis not present

## 2015-05-23 MED ORDER — LOSARTAN POTASSIUM 50 MG PO TABS
25.0000 mg | ORAL_TABLET | Freq: Every day | ORAL | Status: DC
Start: 2015-05-23 — End: 2015-06-28

## 2015-05-23 MED ORDER — DOXYCYCLINE HYCLATE 100 MG PO TABS: 100.0000 mg | ORAL_TABLET | Freq: Two times a day (BID) | ORAL | Status: DC

## 2015-05-23 NOTE — Patient Instructions (Addendum)
Cut the losartan in half, down to 25mg  a day.  Restart doxy and update [place] as needed.  Take care.  Glad to see you.

## 2015-05-23 NOTE — Progress Notes (Signed)
Pre visit review using our clinic review tool, if applicable. No additional management support is needed unless otherwise documented below in the visit note.  Patient is not sure of medication list and will check it when he gets home and report any discrepancies.  Was lightheaded at [employer], no CP, not SOB, passed out briefly.  D/w pt.  Has seen cards in the meantime.  Now on amiodarone and with lower dose of BB.  Still on 50mg  losartan.  BP low today.  Still occ lightheaded, briefly.  No other syncope.   In meantime, sinus pain, upper and lower, R and L sided.  Some cough, ribs sore from cough.  ST and some sputum.  Taking mucinex.  No fevers.   Meds, vitals, and allergies reviewed.   ROS: See HPI.  Otherwise, noncontributory.  GEN: nad, alert and oriented HEENT: mucous membranes moist, tm w/o erythema, nasal exam w/o erythema, clear discharge noted,  OP with cobblestoning, sinuses ttp x4 NECK: supple w/o LA CV: rrr.   PULM: ctab, no inc wob EXT: no edema

## 2015-05-24 DIAGNOSIS — J019 Acute sinusitis, unspecified: Secondary | ICD-10-CM | POA: Insufficient documentation

## 2015-05-24 NOTE — Assessment & Plan Note (Signed)
Nontoxic, okay for outpatient f/u.  Ctab.  Start doxy, see AVS.  He agrees.   Incidentally noted that his prev diarrhea has resolved.

## 2015-05-24 NOTE — Assessment & Plan Note (Signed)
Reviewed with patient.  No recurrent episodes.   Still with lower BP.  Cut the losartan in half, down to 25mg  a day.  He agrees.  Update me as needed.  >25 minutes spent in face to face time with patient, >50% spent in counselling or coordination of care.

## 2015-05-25 ENCOUNTER — Encounter (HOSPITAL_COMMUNITY): Payer: Self-pay | Admitting: Emergency Medicine

## 2015-05-25 ENCOUNTER — Emergency Department (HOSPITAL_COMMUNITY): Payer: Medicare Other

## 2015-05-25 ENCOUNTER — Other Ambulatory Visit: Payer: Self-pay

## 2015-05-25 ENCOUNTER — Encounter: Payer: Medicare Other | Admitting: Cardiology

## 2015-05-25 ENCOUNTER — Other Ambulatory Visit (HOSPITAL_COMMUNITY): Payer: Self-pay

## 2015-05-25 ENCOUNTER — Inpatient Hospital Stay (HOSPITAL_COMMUNITY)
Admission: EM | Admit: 2015-05-25 | Discharge: 2015-05-28 | DRG: 309 | Disposition: A | Discharge status: Home / Self Care | Payer: Medicare Other | Attending: Internal Medicine | Admitting: Internal Medicine

## 2015-05-25 ENCOUNTER — Telehealth: Payer: Self-pay | Admitting: *Deleted

## 2015-05-25 DIAGNOSIS — R748 Abnormal levels of other serum enzymes: Secondary | ICD-10-CM | POA: Diagnosis not present

## 2015-05-25 DIAGNOSIS — T82198A Other mechanical complication of other cardiac electronic device, initial encounter: Secondary | ICD-10-CM | POA: Diagnosis not present

## 2015-05-25 DIAGNOSIS — I472 Ventricular tachycardia, unspecified: Secondary | ICD-10-CM

## 2015-05-25 DIAGNOSIS — Z95810 Presence of automatic (implantable) cardiac defibrillator: Secondary | ICD-10-CM

## 2015-05-25 DIAGNOSIS — J449 Chronic obstructive pulmonary disease, unspecified: Secondary | ICD-10-CM | POA: Diagnosis present

## 2015-05-25 DIAGNOSIS — E872 Acidosis, unspecified: Secondary | ICD-10-CM

## 2015-05-25 DIAGNOSIS — Z4502 Encounter for adjustment and management of automatic implantable cardiac defibrillator: Secondary | ICD-10-CM

## 2015-05-25 DIAGNOSIS — Z7901 Long term (current) use of anticoagulants: Secondary | ICD-10-CM | POA: Diagnosis not present

## 2015-05-25 DIAGNOSIS — R55 Syncope and collapse: Secondary | ICD-10-CM | POA: Diagnosis not present

## 2015-05-25 DIAGNOSIS — Z8679 Personal history of other diseases of the circulatory system: Secondary | ICD-10-CM

## 2015-05-25 DIAGNOSIS — I4901 Ventricular fibrillation: Principal | ICD-10-CM | POA: Diagnosis present

## 2015-05-25 DIAGNOSIS — F17210 Nicotine dependence, cigarettes, uncomplicated: Secondary | ICD-10-CM | POA: Diagnosis present

## 2015-05-25 DIAGNOSIS — Z7982 Long term (current) use of aspirin: Secondary | ICD-10-CM | POA: Diagnosis not present

## 2015-05-25 DIAGNOSIS — R7989 Other specified abnormal findings of blood chemistry: Secondary | ICD-10-CM

## 2015-05-25 DIAGNOSIS — I481 Persistent atrial fibrillation: Secondary | ICD-10-CM | POA: Diagnosis not present

## 2015-05-25 DIAGNOSIS — I5022 Chronic systolic (congestive) heart failure: Secondary | ICD-10-CM | POA: Diagnosis present

## 2015-05-25 DIAGNOSIS — I252 Old myocardial infarction: Secondary | ICD-10-CM

## 2015-05-25 DIAGNOSIS — I5023 Acute on chronic systolic (congestive) heart failure: Secondary | ICD-10-CM | POA: Diagnosis present

## 2015-05-25 DIAGNOSIS — Z9861 Coronary angioplasty status: Secondary | ICD-10-CM | POA: Diagnosis not present

## 2015-05-25 DIAGNOSIS — E785 Hyperlipidemia, unspecified: Secondary | ICD-10-CM | POA: Diagnosis present

## 2015-05-25 DIAGNOSIS — Z950 Presence of cardiac pacemaker: Secondary | ICD-10-CM

## 2015-05-25 DIAGNOSIS — I255 Ischemic cardiomyopathy: Secondary | ICD-10-CM | POA: Diagnosis present

## 2015-05-25 DIAGNOSIS — I4819 Other persistent atrial fibrillation: Secondary | ICD-10-CM

## 2015-05-25 DIAGNOSIS — Z9841 Cataract extraction status, right eye: Secondary | ICD-10-CM | POA: Diagnosis not present

## 2015-05-25 DIAGNOSIS — R778 Other specified abnormalities of plasma proteins: Secondary | ICD-10-CM

## 2015-05-25 DIAGNOSIS — I2510 Atherosclerotic heart disease of native coronary artery without angina pectoris: Secondary | ICD-10-CM | POA: Diagnosis present

## 2015-05-25 DIAGNOSIS — I110 Hypertensive heart disease with heart failure: Secondary | ICD-10-CM | POA: Diagnosis present

## 2015-05-25 DIAGNOSIS — I480 Paroxysmal atrial fibrillation: Secondary | ICD-10-CM | POA: Diagnosis present

## 2015-05-25 HISTORY — DX: Ventricular tachycardia: I47.2

## 2015-05-25 HISTORY — DX: Ventricular tachycardia, unspecified: I47.20

## 2015-05-25 HISTORY — DX: Chronic systolic (congestive) heart failure: I50.22

## 2015-05-25 LAB — BASIC METABOLIC PANEL
Anion gap: 11 (ref 5–15)
BUN: 16 mg/dL (ref 6–20)
CO2: 24 mmol/L (ref 22–32)
Calcium: 8.9 mg/dL (ref 8.9–10.3)
Chloride: 107 mmol/L (ref 101–111)
Creatinine, Ser: 1.2 mg/dL (ref 0.61–1.24)
GFR, EST NON AFRICAN AMERICAN: 60 mL/min — AB (ref >60)
Glucose, Bld: 183 mg/dL — ABNORMAL HIGH (ref 65–99)
POTASSIUM: 4.4 mmol/L (ref 3.5–5.1)
SODIUM: 142 mmol/L (ref 135–145)

## 2015-05-25 LAB — CBC
HEMATOCRIT: 43.3 % (ref 39.0–52.0)
Hemoglobin: 13.8 g/dL (ref 13.0–17.0)
MCH: 30 pg (ref 26.0–34.0)
MCHC: 31.9 g/dL (ref 30.0–36.0)
MCV: 94.1 fL (ref 78.0–100.0)
PLATELETS: 237 10*3/uL (ref 150–400)
RBC: 4.6 MIL/uL (ref 4.22–5.81)
RDW: 13.7 % (ref 11.5–15.5)
WBC: 10.1 10*3/uL (ref 4.0–10.5)

## 2015-05-25 LAB — I-STAT TROPONIN, ED: Troponin i, poc: 0.12 ng/mL (ref 0.00–0.08)

## 2015-05-25 LAB — DIGOXIN LEVEL: DIGOXIN LVL: 1.1 ng/mL (ref 0.8–2.0)

## 2015-05-25 LAB — I-STAT CG4 LACTIC ACID, ED
LACTIC ACID, VENOUS: 1.56 mmol/L (ref 0.5–2.0)
Lactic Acid, Venous: 2.87 mmol/L (ref 0.5–2.0)

## 2015-05-25 LAB — MAGNESIUM: Magnesium: 2 mg/dL (ref 1.7–2.4)

## 2015-05-25 LAB — PHOSPHORUS: Phosphorus: 1.9 mg/dL — ABNORMAL LOW (ref 2.5–4.6)

## 2015-05-25 LAB — PROTIME-INR
INR: 1.13 (ref 0.00–1.49)
Prothrombin Time: 14.7 seconds (ref 11.6–15.2)

## 2015-05-25 LAB — BRAIN NATRIURETIC PEPTIDE: B Natriuretic Peptide: 252.2 pg/mL — ABNORMAL HIGH (ref 0.0–100.0)

## 2015-05-25 LAB — TROPONIN I: TROPONIN I: 0.29 ng/mL — AB (ref <0.031)

## 2015-05-25 MED ORDER — DIGOXIN 125 MCG PO TABS
0.1250 mg | ORAL_TABLET | ORAL | Status: DC
  Filled 2015-05-25: qty 1

## 2015-05-25 MED ORDER — TRAMADOL HCL 50 MG PO TABS: 50.0000 mg | ORAL_TABLET | Freq: Three times a day (TID) | ORAL | Status: DC | PRN

## 2015-05-25 MED ORDER — CARVEDILOL 6.25 MG PO TABS
6.2500 mg | ORAL_TABLET | Freq: Two times a day (BID) | ORAL | Status: DC
  Administered 2015-05-26: 6.25 mg via ORAL
  Filled 2015-05-25: qty 1

## 2015-05-25 MED ORDER — TRAMADOL HCL 50 MG PO TABS: 50.0000 mg | ORAL_TABLET | Freq: Two times a day (BID) | ORAL | Status: DC | PRN

## 2015-05-25 MED ORDER — ONDANSETRON HCL 4 MG/2ML IJ SOLN: 4.0000 mg | Freq: Four times a day (QID) | INTRAMUSCULAR | Status: DC | PRN

## 2015-05-25 MED ORDER — AMIODARONE HCL IN DEXTROSE 360-4.14 MG/200ML-% IV SOLN
60.0000 mg/h | INTRAVENOUS | Status: AC
  Administered 2015-05-25: 60 mg/h via INTRAVENOUS
  Filled 2015-05-25: qty 200

## 2015-05-25 MED ORDER — ALBUTEROL SULFATE (2.5 MG/3ML) 0.083% IN NEBU: 2.5000 mg | INHALATION_SOLUTION | Freq: Four times a day (QID) | RESPIRATORY_TRACT | Status: DC | PRN

## 2015-05-25 MED ORDER — AMIODARONE LOAD VIA INFUSION
150.0000 mg | Freq: Once | INTRAVENOUS | Status: AC
  Administered 2015-05-25: 150 mg via INTRAVENOUS
  Filled 2015-05-25: qty 83.34

## 2015-05-25 MED ORDER — NITROGLYCERIN 0.4 MG SL SUBL: 0.4000 mg | SUBLINGUAL_TABLET | SUBLINGUAL | Status: DC | PRN

## 2015-05-25 MED ORDER — LOPERAMIDE HCL 2 MG PO CAPS
4.0000 mg | ORAL_CAPSULE | Freq: Two times a day (BID) | ORAL | Status: DC | PRN
  Administered 2015-05-26 – 2015-05-28 (×2): 4 mg via ORAL
  Filled 2015-05-25 (×2): qty 2

## 2015-05-25 MED ORDER — AMIODARONE HCL IN DEXTROSE 360-4.14 MG/200ML-% IV SOLN
30.0000 mg/h | INTRAVENOUS | Status: DC
  Administered 2015-05-26 – 2015-05-27 (×3): 30 mg/h via INTRAVENOUS
  Filled 2015-05-25 (×4): qty 200

## 2015-05-25 MED ORDER — LOSARTAN POTASSIUM 25 MG PO TABS
25.0000 mg | ORAL_TABLET | Freq: Every day | ORAL | Status: DC
  Filled 2015-05-25: qty 1

## 2015-05-25 MED ORDER — BENZONATATE 100 MG PO CAPS
100.0000 mg | ORAL_CAPSULE | ORAL | Status: DC | PRN
  Administered 2015-05-26 – 2015-05-27 (×2): 100 mg via ORAL
  Filled 2015-05-25 (×3): qty 1

## 2015-05-25 MED ORDER — DIGOXIN 125 MCG PO TABS: 0.1250 mg | ORAL_TABLET | ORAL | Status: DC

## 2015-05-25 MED ORDER — ASPIRIN EC 325 MG PO TBEC
325.0000 mg | DELAYED_RELEASE_TABLET | Freq: Every day | ORAL | Status: DC
  Administered 2015-05-25 – 2015-05-26 (×2): 325 mg via ORAL
  Filled 2015-05-25 (×2): qty 1

## 2015-05-25 MED ORDER — DOXYCYCLINE HYCLATE 100 MG PO TABS
100.0000 mg | ORAL_TABLET | Freq: Two times a day (BID) | ORAL | Status: DC
  Administered 2015-05-25 – 2015-05-28 (×6): 100 mg via ORAL
  Filled 2015-05-25 (×6): qty 1

## 2015-05-25 MED ORDER — ZOLPIDEM TARTRATE 5 MG PO TABS
5.0000 mg | ORAL_TABLET | Freq: Every evening | ORAL | Status: DC | PRN
  Administered 2015-05-26 – 2015-05-27 (×2): 5 mg via ORAL
  Filled 2015-05-25 (×2): qty 1

## 2015-05-25 MED ORDER — ALBUTEROL SULFATE HFA 108 (90 BASE) MCG/ACT IN AERS: 2.0000 | INHALATION_SPRAY | Freq: Four times a day (QID) | RESPIRATORY_TRACT | Status: DC | PRN

## 2015-05-25 MED ORDER — ALPRAZOLAM 0.25 MG PO TABS: 0.2500 mg | ORAL_TABLET | Freq: Two times a day (BID) | ORAL | Status: DC | PRN

## 2015-05-25 MED ORDER — ACETAMINOPHEN 325 MG PO TABS
650.0000 mg | ORAL_TABLET | ORAL | Status: DC | PRN
  Administered 2015-05-26: 650 mg via ORAL
  Filled 2015-05-25: qty 2

## 2015-05-25 MED ORDER — ENOXAPARIN SODIUM 40 MG/0.4ML ~~LOC~~ SOLN
40.0000 mg | SUBCUTANEOUS | Status: DC
  Administered 2015-05-25: 40 mg via SUBCUTANEOUS
  Filled 2015-05-25: qty 0.4

## 2015-05-25 MED ORDER — HYDROCODONE-ACETAMINOPHEN 5-325 MG PO TABS: 1.0000 | ORAL_TABLET | Freq: Four times a day (QID) | ORAL | Status: DC | PRN

## 2015-05-25 MED ORDER — ADULT MULTIVITAMIN W/MINERALS CH
1.0000 | ORAL_TABLET | Freq: Every day | ORAL | Status: DC
  Administered 2015-05-25 – 2015-05-28 (×4): 1 via ORAL
  Filled 2015-05-25 (×5): qty 1

## 2015-05-25 MED ORDER — DIAZEPAM 5 MG PO TABS: 5.0000 mg | ORAL_TABLET | Freq: Four times a day (QID) | ORAL | Status: DC | PRN

## 2015-05-25 MED ORDER — LORATADINE 10 MG PO TABS
10.0000 mg | ORAL_TABLET | Freq: Every day | ORAL | Status: DC
  Administered 2015-05-25 – 2015-05-28 (×4): 10 mg via ORAL
  Filled 2015-05-25 (×4): qty 1

## 2015-05-25 MED ORDER — LOSARTAN POTASSIUM 25 MG PO TABS
25.0000 mg | ORAL_TABLET | Freq: Every day | ORAL | Status: DC
  Administered 2015-05-26 – 2015-05-28 (×3): 25 mg via ORAL
  Filled 2015-05-25 (×3): qty 1

## 2015-05-25 MED ORDER — FAMOTIDINE 20 MG PO TABS
20.0000 mg | ORAL_TABLET | Freq: Every day | ORAL | Status: DC
  Administered 2015-05-25 – 2015-05-27 (×3): 20 mg via ORAL
  Filled 2015-05-25 (×3): qty 1

## 2015-05-25 NOTE — ED Notes (Signed)
Patient states he was at home getting dressed this morning and his defibrillator fired.  Patient states he was not having any symptoms, just felt his heart beating (not faster or slower than normal).   Patient states was supposed to go to cardiologist today to have his defibrillator adjusted.

## 2015-05-25 NOTE — Telephone Encounter (Signed)
Called and spoke with patient regarding shock this morning. He denies symptoms. Dr. [name] prescribed him amiodarone 05/15/15, patient reports that he has taken his first dose today. Pt is aware of driving restrictions x 6 months from today. Dr. [name] has no further recommendations other than compliance with amio.

## 2015-05-25 NOTE — Telephone Encounter (Signed)
Patient called back reporting that he received another shock.  He denies chest pain, SOB, or dizziness prior to or after this shock.  Advised patient that per previous recommendations, he should proceed to the [facility] ED.  Patient will have his wife bring him as he is not feeling poorly at this time.  Dr. [name] made aware that patient is proceeding to the ED.  Patient is appreciative of instructions and denies additional questions at this time.  Appointment with Dr. Elberta Fortis canceled.

## 2015-05-25 NOTE — Progress Notes (Signed)
Paged cardiology fellow to inform patient's heart rate in the 50's and periodically dropping in to the 30-40's, patient asymptomatic.  MD Terressa Koyanagi advised ok to lower amiodarone rate now and for RN to continue to monitor patient.

## 2015-05-25 NOTE — Telephone Encounter (Signed)
Patient calling because he felt like he got shocked this morning about 8am. He says he was sleeping and woke up because of it. He denies any symptoms. He reports that he took his BP sometime after the episode and it was low (98/74) but he was not feeling poorly. He will send a manual transmission for review and I will call him back with results.  Reviewed with Dr. Johney Frame- pt appears to be in VT falling below ICD detection settings. Patient has someone to bring him to the office today- added to Dr. Gershon Crane schedule at 2pm. I advised the patient to go to the ED if he experiences syncope or any more shocks. He is agreeable.

## 2015-05-25 NOTE — ED Provider Notes (Signed)
I saw and evaluated the patient, reviewed the resident's note and I agree with the findings and plan.  Pertinent History: the pt has hx of arryhthmia -has had ICD for the last 10 + years - has hx of batter change in last 5 years - presents after syncope several days ago and then 3 episodes of V tach today that required defib events - he has no prodromal sx but has confirmed arrhythmia by interrogation - no c/o at this time - dig level pending  Pertinent Exam findings: calm, no distress, no edema, clear lungs, pulse of 60, normal pulses, no JVD, soft NT abd  Def / V tach - d/w Cards - consult pending.   EKG Interpretation  Date/Time:  Thursday May 25 2015 13:42:38 EDT Ventricular Rate:  58 PR Interval:  274 QRS Duration: 178 QT Interval:  450 QTC Calculation: 441 R Axis:   145 Text Interpretation:  Sinus bradycardia with marked sinus arrhythmia with 1st degree A-V block Right bundle branch block Left posterior fascicular block T wave abnormality, consider inferior ischemia Abnormal ECG Since last tracing rate slowed Confirmed by Kriston Pasquarello  MD, Marchell Froman ([id number]) on 05/25/2015 3:32:15 PM        I personally interpreted the EKG as well as the resident and agree with the interpretation on the resident's chart.  Final diagnoses:  VT (ventricular tachycardia) (HCC)  AICD discharge  Lactic acidosis  Elevated troponin      Eber Hong, MD 05/28/15 2033

## 2015-05-25 NOTE — ED Provider Notes (Signed)
CSN: [id number]     Arrival date & time 05/25/15  1336 History   None    Chief Complaint  Patient presents with  . defib fired      (Consider location/radiation/quality/duration/timing/severity/associated sxs/prior Treatment) The history is provided by a significant other.     70 year old male with past medical history of hypertension, hyperlipidemia, recurrent V. tach status post ICD placement, currently followed by Dr. [name], who presents with 3 shocks in the last 24 hours. Patient states that he woke this morning at around 7:30 with what he believes was a shock from his ICD. This woke him up from sleep. He called cardiology and was scheduled to come into clinic but then around noon after walking upstairs, he had 2 subsequent shocks. He states he did feel a fluttering in his chest as well as lightheadedness prior to the shots. Of note, he also had a syncopal event on April 17 that he suspects delivered a shock. Prior to the last month, the patient has had only one firing of his defibrillator. Recent medication changes include initiation of amiodarone as an outpatient as well as doxycycline 2 days ago for sinus infection. Denies any fevers. He also states that his blood pressure has been decreasing lately and he recently halved his diuretic. No recent fevers or chills.  Past Medical History  Diagnosis Date  . ICD (implantable cardiac defibrillator) in place   . CAD (coronary artery disease), autologous vein bypass graft   . Allergic rhinitis, cause unspecified   . HLD (hyperlipidemia)   . Diverticulosis     by CT scan  . Paroxysmal ventricular tachycardia (HCC)   . Atrial fibrillation (HCC)   . Acute myocardial infarction, unspecified site, episode of care unspecified 1995    Pt living in [place], no stent, ?PTCA  . Anginal pain (HCC)    . Acute lower GI bleeding 12/11/2011    "first time" (12/11/2011)  . Chronic systolic CHF (congestive heart failure), NYHA class 2 (HCC)     Reports  EF of 25%.   . Arthritis     knees, back  . Insomnia   . Anxiety   . Cervical herniated disc     told not to lift >10 lbs  . COPD (chronic obstructive pulmonary disease) (HCC) 11/2012    by xray  . Perennial allergic rhinitis     only to dust mites   Past Surgical History  Procedure Laterality Date  . Cardiac defibrillator placement  2004  . Tonsillectomy and adenoidectomy  ~ 1951  . Cholecystectomy  1/ 2012  . Shoulder arthroscopy w/ rotator cuff repair  twice    right (12/11/2011)  . Coronary angioplasty  1995    Pt thinks he got a balloon, living in [place], [place]  . Partial knee arthroplasty  ~ 2000    left  . Insert / replace / remove pacemaker  2004    Medtronic ICD  . Colonoscopy  01/08/2012    Procedure: COLONOSCOPY;  Surgeon: Iva Boop, MD;  Location: [facility];  Service: Endoscopy;  Laterality: N/A;  . Cataract extraction Right 01/2012  . Implantable cardioverter defibrillator generator change N/A 02/07/2012    Procedure: IMPLANTABLE CARDIOVERTER DEFIBRILLATOR GENERATOR CHANGE;  Surgeon: Marinus Maw, MD; Medtronic Evera XT VR single-chamber serial number [id number], Laterality: Left  . Cardiac catheterization  2004    LAD 30%, D1 30%, CFX-AV groove 70-80%, OM1 30%, EF 20-25%   Family History  Problem Relation Age of Onset  .  Diabetes Father   . Tracheal cancer Father 38    smoker  . Stroke Mother   . Cancer Sister     left eye  . CAD Neg Hx   . Colon cancer Neg Hx   . Prostate cancer Neg Hx    Social History  Substance Use Topics  . Smoking status: Current Every Day Smoker -- 0.25 packs/day for 50 years    Types: Cigarettes, Cigars  . Smokeless tobacco: Never Used     Comment: ~ 8 cigarettes daily  . Alcohol Use: No    Review of Systems  Constitutional: Negative for fever, chills and fatigue.  HENT: Positive for congestion and sinus pressure.   Eyes: Negative for visual disturbance.  Respiratory: Negative for cough, shortness of breath and  wheezing.   Cardiovascular: Negative for chest pain and leg swelling.  Gastrointestinal: Negative for nausea, vomiting, abdominal pain and diarrhea.  Genitourinary: Negative for dysuria and flank pain.  Musculoskeletal: Negative for neck pain.  Skin: Negative for rash.  Neurological: Negative for syncope, weakness and headaches.      Allergies  Ace inhibitors; Codeine; Penicillins; Lisinopril; and Statins  Home Medications   Prior to Admission medications   Medication Sig Start Date End Date Taking? Authorizing Provider  albuterol (PROVENTIL HFA;VENTOLIN HFA) 108 (90 BASE) MCG/ACT inhaler Inhale 2 puffs into the lungs every 6 (six) hours as needed. As needed for shortness of breath.   Yes Historical Provider, MD  amiodarone (PACERONE) 200 MG tablet Take 1 tablet (200 mg total) by mouth daily. 05/15/15  Yes Marinus Maw, MD  aspirin 325 MG EC tablet Take 325 mg by mouth daily.   Yes Historical Provider, MD  benzonatate (TESSALON) 200 MG capsule TAKE 1 CAPSULE BY MOUTH 3 TIMES DAILY AS NEEDED FOR COUGH 10/31/14  Yes Joaquim Nam, MD  carvedilol (COREG) 6.25 MG tablet Take 1 tablet (6.25 mg total) by mouth 2 (two) times daily with a meal. 05/12/15  Yes Amy E Bedsole, MD  diazepam (VALIUM) 5 MG tablet TAKE 1 TABLET BY MOUTH EVERY 6 HOURS AS NEEDED 04/20/15  Yes Joaquim Nam, MD  diclofenac sodium (VOLTAREN) 1 % GEL Apply 1 application topically 3 (three) times daily. 08/17/13  Yes Eustaquio Boyden, MD  digoxin (LANOXIN) 0.125 MG tablet Take one tablet by mouth Mon-Fri only 05/18/15  Yes Marinus Maw, MD  doxycycline (VIBRA-TABS) 100 MG tablet Take 1 tablet (100 mg total) by mouth 2 (two) times daily. 05/23/15  Yes Joaquim Nam, MD  fexofenadine (ALLEGRA) 180 MG tablet Take 180 mg by mouth daily as needed for allergies or rhinitis.   Yes Historical Provider, MD  furosemide (LASIX) 20 MG tablet TAKE 1 TABLET BY MOUTH FOR 3 DAYS THEN AS NEEDED 08/17/13  Yes Eustaquio Boyden, MD   HYDROcodone-acetaminophen (NORCO/VICODIN) 5-325 MG tablet TAKE 1 TABLET BY MOUTH EVERY 6 HOURS AS NEEDED FOR PAIN 01/17/15  Yes Joaquim Nam, MD  losartan (COZAAR) 50 MG tablet Take 0.5 tablets (25 mg total) by mouth daily. 05/23/15  Yes Joaquim Nam, MD  Multiple Vitamins-Minerals (MULTIVITAMIN ADULTS 50+) TABS Take 1 tablet by mouth daily.   Yes Historical Provider, MD  nitroGLYCERIN (NITROSTAT) 0.4 MG SL tablet Place 1 tablet (0.4 mg total) under the tongue every 5 (five) minutes as needed. For chest pain. 08/17/13  Yes Eustaquio Boyden, MD  promethazine (PHENERGAN) 25 MG tablet TAKE 1 TABLET BY MOUTH EVERY 6 HOURS AS NEEDED FOR NAUSEA/VOMITING 02/22/15  Yes Cheree Ditto  Lianne Bushy, MD  ranitidine (ZANTAC) 150 MG capsule Take 150 mg by mouth every evening.   Yes Historical Provider, MD  traMADol (ULTRAM) 50 MG tablet TAKE 1 TO 2 TABLETS BY MOUTH EVERY 8 HOURS AS NEEDED 02/19/15  Yes Joaquim Nam, MD  zolpidem (AMBIEN CR) 12.5 MG CR tablet TAKE 1 TABLET BY MOUTH AT BEDTIME 01/17/15  Yes Joaquim Nam, MD  metaxalone (SKELAXIN) 800 MG tablet TAKE 1 TABLET BY MOUTH 3 TIMES A DAY AS NEEDED FOR PAIN. 12/19/14   Joaquim Nam, MD   BP 115/71 mmHg  Pulse 59  Temp(Src) 97.8 F (36.6 C) (Oral)  Resp 13  Ht 5\' 3"  (1.6 m)  Wt 59.92 kg  BMI 23.41 kg/m2  SpO2 94% Physical Exam  Constitutional: He appears well-developed and well-nourished. No distress.  HENT:  Head: Normocephalic.  Mouth/Throat: No oropharyngeal exudate.  Eyes: Pupils are equal, round, and reactive to light.  Neck: Normal range of motion. Neck supple.  Cardiovascular: Normal rate, regular rhythm, normal heart sounds and intact distal pulses.  Exam reveals no friction rub.   No murmur heard. Pulmonary/Chest: Effort normal and breath sounds normal. No respiratory distress. He has no wheezes. He has no rales.  ICD site c/d/i, with no overlying erythema or induration  Abdominal: Soft. Bowel sounds are normal. He exhibits no  distension. There is no tenderness.  Musculoskeletal: He exhibits no edema.  Neurological: He is alert.  Skin: Skin is warm. No rash noted.  Nursing note and vitals reviewed.   ED Course  Procedures (including critical care time) Labs Review Labs Reviewed  BASIC METABOLIC PANEL - Abnormal; Notable for the following:    Glucose, Bld 183 (*)    GFR calc non Af Amer 60 (*)    All other components within normal limits  PHOSPHORUS - Abnormal; Notable for the following:    Phosphorus 1.9 (*)    All other components within normal limits  TROPONIN I - Abnormal; Notable for the following:    Troponin I 0.29 (*)    All other components within normal limits  BRAIN NATRIURETIC PEPTIDE - Abnormal; Notable for the following:    B Natriuretic Peptide 252.2 (*)    All other components within normal limits  I-STAT TROPOININ, ED - Abnormal; Notable for the following:    Troponin i, poc 0.12 (*)    All other components within normal limits  I-STAT CG4 LACTIC ACID, ED - Abnormal; Notable for the following:    Lactic Acid, Venous 2.87 (*)    All other components within normal limits  MRSA PCR SCREENING  CBC  MAGNESIUM  DIGOXIN LEVEL  PROTIME-INR  COMPREHENSIVE METABOLIC PANEL  I-STAT CG4 LACTIC ACID, ED    Imaging Review Dg Chest Portable 1 View  05/25/2015  CLINICAL DATA:  Syncope 1 week ago. Three defibrillator discharges today. Initial encounter. EXAM: PORTABLE CHEST 1 VIEW COMPARISON:  PA and lateral chest 11/12/2012. FINDINGS: AICD is unchanged in appearance. There is cardiomegaly without edema. No pneumothorax or pleural effusion. Postoperative change right shoulder noted. IMPRESSION: Cardiomegaly without acute disease. Electronically Signed   By: Drusilla Kanner M.D.   On: 05/25/2015 15:46   I have personally reviewed and evaluated these images and lab results as part of my medical decision-making.   EKG Interpretation   Date/Time:  Thursday May 25 2015 13:42:38 EDT Ventricular  Rate:  58 PR Interval:  274 QRS Duration: 178 QT Interval:  450 QTC Calculation: 441 R Axis:  145 Text Interpretation:  Sinus bradycardia with marked sinus arrhythmia with  1st degree A-V block Right bundle branch block Left posterior fascicular  block T wave abnormality, consider inferior ischemia Abnormal ECG Since  last tracing rate slowed Confirmed by MILLER  MD, BRIAN ([id number]) on  05/25/2015 3:32:15 PM      MDM   70 yo M with PMHx of HTN, HLD, CAD with ICM (ef 25%), h/o recurrent VTach s/p AICD placement who presents with recurrent shocks x 3 in past 24 hr. Unknown triggers - pt does admit to recent sinus infection and has been taking doxycyline. Pt also recently started on amio per records. No other changes in meds. No fevers or signs of systemic infection. Euvolemic on exam without overt fluid overload. Will check labs, consult Cardiology for AICD interrogation and recommendations.  AICD interrogated. Patient has had recurrent episodes of V. tach. He had one episode at 5:30 this morning which was successfully converted with ATP but then required a shock at 7:30. He also had 2 shocks for V. tach at around noon. Patient has had no runs of V. tach here. Labs and imaging are reviewed as above. CBC with no leukocytosis or anemia. BMP with baseline renal function. Dig level at goal. Trop elevated at 0.12 - likely 2/2 shock, will trend. Denies CP currently. LA elevated, suggesting component of hypoperfusion during VTach episodes. No fever, leukocytosis, or signs of infection. Discussed case with cardiology. Will start amiodarone IV and admit for monitoring.  Clinical Impression: 1. VT (ventricular tachycardia) (HCC)   2. AICD discharge   3. Lactic acidosis   4. Elevated troponin     Disposition: Admit  Condition: Stable  Pt seen in conjunction with Dr. De Burrs, MD 05/26/15 6045  Eber Hong, MD 05/28/15 2032

## 2015-05-25 NOTE — H&P (Signed)
History and Physical   Patient ID: DAGMAWI VENABLE MRN: [id number], DOB/AGE: [birth year] 70 y.o. Date of Encounter: 05/25/2015  Primary Physician: Crawford Givens, MD Primary Cardiologist: Dr [name]  Chief Complaint:  VT  HPI: MARKAS ALDREDGE is a 70 y.o. male with a history of ischemic cardiomyopathy, chronic systolic heart failure, class III, atrial fibrillation, unwilling to take any anticoagulation, ventricular tachycardia, status post MDT ICD implantation.   His weight has been stable and his respiratory status has been good. He has had no chest pain.  He was seen by Dr [name] on 04/10 and had had 2 ICD shocks for VT at 200 bpm. He was started on amiodarone at 200 mg qd, but there was a delay starting it because of concerns by the pharmacy for medication interactions. On 04/14, he had a brief syncopal episode in the grocery store. No palpitations, LOC was very brief, no sequelae.   He had done well since then, the device shocked him on 04/17.  Today, he woke feeling like he had been shocked in his sleep, he did not feel quite right. His symptoms continued and he called the office. He was initially scheduled to see Dr Elberta Fortis at 2 pm. However, he got another shock and called back. When the device interrogation was reviewed by Dr [name], he was noted to have had VF x 2 today, 1 episode on the 17th. He was asked to come to the ER for admission.  Currently, he is in S brady, HR 50s and SBP 90s. He feels well and denies chest pain, SOB or presyncope. His dry weight is 130 lbs and he has been within 2 lbs of this on his home scales.   Past Medical History  Diagnosis Date  . ICD (implantable cardiac defibrillator) in place   . CAD (coronary artery disease), autologous vein bypass graft   . Allergic rhinitis, cause unspecified   . HLD (hyperlipidemia)   . Diverticulosis     by CT scan  . Paroxysmal ventricular tachycardia (HCC)   . Atrial fibrillation (HCC)   . Acute myocardial  infarction, unspecified site, episode of care unspecified 1995    Pt living in [place], no stent, ?PTCA  . Anginal pain (HCC)    . Acute lower GI bleeding 12/11/2011    "first time" (12/11/2011)  . Chronic systolic CHF (congestive heart failure), NYHA class 2 (HCC)     Reports EF of 25%.   . Arthritis     knees, back  . Insomnia   . Anxiety   . Cervical herniated disc     told not to lift >10 lbs  . COPD (chronic obstructive pulmonary disease) (HCC) 11/2012    by xray  . Perennial allergic rhinitis     only to dust mites    Surgical History:  Past Surgical History  Procedure Laterality Date  . Cardiac defibrillator placement  2004  . Tonsillectomy and adenoidectomy  ~ 1951  . Cholecystectomy  1/ 2012  . Shoulder arthroscopy w/ rotator cuff repair  twice    right (12/11/2011)  . Coronary angioplasty  1995    Pt thinks he got a balloon, living in [place], [place]  . Partial knee arthroplasty  ~ 2000    left  . Insert / replace / remove pacemaker  2004    Medtronic ICD  . Colonoscopy  01/08/2012    Procedure: COLONOSCOPY;  Surgeon: Iva Boop, MD;  Location: [facility];  Service: Endoscopy;  Laterality: N/A;  . Cataract extraction Right 01/2012  . Implantable cardioverter defibrillator generator change N/A 02/07/2012    Procedure: IMPLANTABLE CARDIOVERTER DEFIBRILLATOR GENERATOR CHANGE;  Surgeon: Marinus Maw, MD; Medtronic Evera XT VR single-chamber serial number [id number], Laterality: Left  . Cardiac catheterization  2004    LAD 30%, D1 30%, CFX-AV groove 70-80%, OM1 30%, EF 20-25%     I have reviewed the patient's current medications. Medication Sig  albuterol (PROVENTIL HFA;VENTOLIN HFA) 108 (90 BASE) MCG/ACT inhaler Inhale 2 puffs into the lungs every 6 (six) hours as needed. As needed for shortness of breath.  amiodarone (PACERONE) 200 MG tablet Take 1 tablet (200 mg total) by mouth daily.  aspirin 325 MG EC tablet Take 325 mg by mouth daily.  benzonatate (TESSALON)  200 MG capsule TAKE 1 CAPSULE BY MOUTH 3 TIMES DAILY AS NEEDED FOR COUGH  carvedilol (COREG) 6.25 MG tablet Take 1 tablet (6.25 mg total) by mouth 2 (two) times daily with a meal.  diazepam (VALIUM) 5 MG tablet TAKE 1 TABLET BY MOUTH EVERY 6 HOURS AS NEEDED  diclofenac sodium (VOLTAREN) 1 % GEL Apply 1 application topically 3 (three) times daily.  digoxin (LANOXIN) 0.125 MG tablet Take one tablet by mouth Mon-Fri only  doxycycline (VIBRA-TABS) 100 MG tablet Take 1 tablet (100 mg total) by mouth 2 (two) times daily.  fexofenadine (ALLEGRA) 180 MG tablet Take 180 mg by mouth daily as needed for allergies or rhinitis.  furosemide (LASIX) 20 MG tablet TAKE 1 TABLET BY MOUTH FOR 3 DAYS THEN AS NEEDED  HYDROcodone-acetaminophen (NORCO/VICODIN) 5-325 MG tablet TAKE 1 TABLET BY MOUTH EVERY 6 HOURS AS NEEDED FOR PAIN  losartan (COZAAR) 50 MG tablet Take 0.5 tablets (25 mg total) by mouth daily.  metaxalone (SKELAXIN) 800 MG tablet TAKE 1 TABLET BY MOUTH 3 TIMES A DAY AS NEEDED FOR PAIN.  Multiple Vitamins-Minerals (MULTIVITAMIN ADULTS 50+) TABS Take 1 tablet by mouth daily.  nitroGLYCERIN (NITROSTAT) 0.4 MG SL tablet Place 1 tablet (0.4 mg total) under the tongue every 5 (five) minutes as needed. For chest pain.  promethazine (PHENERGAN) 25 MG tablet TAKE 1 TABLET BY MOUTH EVERY 6 HOURS AS NEEDED FOR NAUSEA/VOMITING  ranitidine (ZANTAC) 150 MG capsule Take 150 mg by mouth every evening.  traMADol (ULTRAM) 50 MG tablet TAKE 1 TO 2 TABLETS BY MOUTH EVERY 8 HOURS AS NEEDED  zolpidem (AMBIEN CR) 12.5 MG CR tablet TAKE 1 TABLET BY MOUTH AT BEDTIME    Allergies:  Allergies  Allergen Reactions  . Ace Inhibitors Other (See Comments)    muscle pain. Tolerates ARBs.   . Codeine Other (See Comments)    "head wants to explode."  . Penicillins Swelling    "started at point of injection; w/in 3 min my upper arm was swollen 3 times normal"  . Lisinopril     Muscle Pain  . Statins Other (See Comments)     Myalgias per patient    Social History   Social History  . Marital Status: Married    Spouse Name: N/A  . Number of Children: 0  . Years of Education: N/A   Occupational History  . [employer] truck driver (retired)    Social History Main Topics  . Smoking status: Current Every Day Smoker -- 0.25 packs/day for 50 years    Types: Cigarettes, Cigars  . Smokeless tobacco: Never Used     Comment: ~ 8 cigarettes daily  . Alcohol Use: No  . Drug Use: No  . Sexual  Activity: No   Other Topics Concern  . Not on file   Social History Narrative   Lives with wife, married 1998   Grown children, 2 great grandchildren   Occupation: retired, was Presenter, broadcasting   Activity: walking, fishing   Diet: good water daily, fruits/vegetables rare      Wife is Product manager.    [id number], Human resources officer. No known agent orange exposure.      Family History  Problem Relation Age of Onset  . Diabetes Father   . Tracheal cancer Father 44    smoker  . Stroke Mother   . Cancer Sister     left eye  . CAD Neg Hx   . Colon cancer Neg Hx   . Prostate cancer Neg Hx    Family Status  Relation Status Death Age  . Father Deceased   . Mother Deceased   . Sister Alive     Review of Systems:   Full 14-point review of systems otherwise negative except as noted above.  Physical Exam: Blood pressure 106/73, pulse 55, temperature 97.5 F (36.4 C), temperature source Oral, resp. rate 16, SpO2 99 %. General: Well developed, well nourished,male in no acute distress. Head: Normocephalic, atraumatic, sclera non-icteric, no xanthomas, nares are without discharge. Dentition: poor Neck: No carotid bruits. JVD minimal elevation. No thyromegally Lungs: Good expansion bilaterally. without wheezes or rhonchi.  Heart: Regular rate and rhythm with S1 S2.  No S3 or S4.  Soft murmur, no rubs, or gallops appreciated. Abdomen: Soft, non-tender, non-distended with normoactive bowel sounds. No hepatomegaly. No rebound/guarding. No  obvious abdominal masses. Msk:  Strength and tone appear normal for age. No joint deformities or effusions, no spine or costo-vertebral angle tenderness. Extremities: No clubbing or cyanosis. No edema.  Distal pedal pulses are 2+ in 4 extrem Neuro: Alert and oriented X 3. Moves all extremities spontaneously. No focal deficits noted. Psych:  Responds to questions appropriately with a normal affect. Skin: No rashes or lesions noted  Labs:   Lab Results  Component Value Date   WBC 10.1 05/25/2015   HGB 13.8 05/25/2015   HCT 43.3 05/25/2015   MCV 94.1 05/25/2015   PLT 237 05/25/2015     Recent Labs Lab 05/25/15 1353  NA 142  K 4.4  CL 107  CO2 24  BUN 16  CREATININE 1.20  CALCIUM 8.9  GLUCOSE 183*   MAGNESIUM  Date Value Ref Range Status  05/25/2015 2.0 1.7 - 2.4 mg/dL Final   Lab Results  Component Value Date   CHOL 170 09/12/2014   HDL 41.10 09/12/2014   LDLCALC 105* 09/12/2014   TRIG 117.0 09/12/2014    Radiology/Studies: Dg Chest Portable 1 View 05/25/2015  CLINICAL DATA:  Syncope 1 week ago. Three defibrillator discharges today. Initial encounter. EXAM: PORTABLE CHEST 1 VIEW COMPARISON:  PA and lateral chest 11/12/2012. FINDINGS: AICD is unchanged in appearance. There is cardiomegaly without edema. No pneumothorax or pleural effusion. Postoperative change right shoulder noted. IMPRESSION: Cardiomegaly without acute disease. Electronically Signed   By: Drusilla Kanner M.D.   On: 05/25/2015 15:46     Cardiac Cath: 2004 ANGIOGRAPHY: Left mid coronary artery had a 20% discreet lesion. The left anterior descending artery had 30% multiple discreet lesions in the mid vessel. First diagonal branch had 30% tubular lesion. The circumflex artery had 20-30% multiple lesions in the proximal vessel. First obtuse marginal branch had 30% multiple discreet lesions. The AV groove branch just after the takeoff of the  first obtuse marginal branch had a 70% tubular  lesion. The distal AV groove branch prior to a small trifurcation had a 70-80% eccentric lesion. RAO VENTRICULOGRAM: RAO ventriculography showed global hypokinesis with inferior, posterior, and apical wall akinesis. The ejection fraction was only in the 20-25% range. There is angiographic grade 1-2 MR. Aortic pressure was in the 144/95 range. LV pressure was in the 144/8 range. IMPRESSION: Films were reviewed with Arturo Morton. Riley Kill, M.D. The patient did not have ischemia on this Cardiolite and the AV groove branch is small and would unlikely to be causing problem. The patient has primarily been having fatigue and not chest pain. We both agreed that increased medical therapy is warranted.   Echo: none in system  ECG: 05/25/2015 SR, rate increasing throughout the ECG RBBB, LPFB are old  ASSESSMENT AND PLAN:  Principal Problem:   Ventricular fibrillation (HCC) - admit, change amio to IV for 24-48 hours to speed loading - MD to review device interrogation, parameters may need to be changed - K+ 4.4, Mg 2.0  - TFTs and LFTs recently OK, Dig level pending - annual eye exams.  Active Problems:   Coronary atherosclerosis - no recent stress test - moderate CAD by cath 2004 - previous MI 1995, possible PTCA, no stent - no recent ischemic symptoms - continue ASA, BB, ARB as BP and baseline HR will allow. - he has been intolerant of statins in the past - MD advise on stress testing or cath    Chronic systolic CHF (congestive heart failure) (HCC) - follow daily weights, I/O - prn Lasix for weight gain    VT (ventricular tachycardia) (HCC) - ?has been having below his detection rate - continue amio    PAF - Pt has refused anticoagulation - CHADS2VASC=4 - No recent episodes    Elevated lactic acid level - in the setting of ICD shocks - no acute illness by sx, WBC and CXR OK - follow   Signed, Leanna Battles 05/25/2015 5:29 PM Beeper  [phone]  EP Attending  Patient seen and examined. He is well known to me and has developed VT storm. He will be admitted and begun on IV amiodarone. Will consider repeat heart cath but not likely in the absence of objective ischemia. His ICD has been reprogrammed to provide more ATP. He has had problems with CHF but does not appear to be excessively volume overloaded. He does appear to have some slower VT. Will hold off on treating this for now but might add additional ATP at lower rates. I am concerned about the possibility of treating atrial fib.  Leonia Reeves.D.

## 2015-05-26 LAB — COMPREHENSIVE METABOLIC PANEL
ALBUMIN: 2.9 g/dL — AB (ref 3.5–5.0)
ALK PHOS: 63 U/L (ref 38–126)
ALT: 15 U/L — AB (ref 17–63)
ANION GAP: 8 (ref 5–15)
AST: 23 U/L (ref 15–41)
BUN: 16 mg/dL (ref 6–20)
CALCIUM: 8.3 mg/dL — AB (ref 8.9–10.3)
CHLORIDE: 106 mmol/L (ref 101–111)
CO2: 25 mmol/L (ref 22–32)
Creatinine, Ser: 1.07 mg/dL (ref 0.61–1.24)
GFR calc Af Amer: >60 mL/min (ref >60)
GFR calc non Af Amer: >60 mL/min (ref >60)
GLUCOSE: 111 mg/dL — AB (ref 65–99)
Potassium: 3.9 mmol/L (ref 3.5–5.1)
SODIUM: 139 mmol/L (ref 135–145)
Total Bilirubin: 0.5 mg/dL (ref 0.3–1.2)
Total Protein: 5.5 g/dL — ABNORMAL LOW (ref 6.5–8.1)

## 2015-05-26 LAB — MRSA PCR SCREENING: MRSA BY PCR: NEGATIVE

## 2015-05-26 MED ORDER — APIXABAN 5 MG PO TABS
5.0000 mg | ORAL_TABLET | Freq: Two times a day (BID) | ORAL | Status: DC
  Administered 2015-05-26 – 2015-05-28 (×5): 5 mg via ORAL
  Filled 2015-05-26 (×5): qty 1

## 2015-05-26 MED ORDER — DIGOXIN 125 MCG PO TABS: 0.1250 mg | ORAL_TABLET | ORAL | Status: DC

## 2015-05-26 MED ORDER — ASPIRIN EC 81 MG PO TBEC
81.0000 mg | DELAYED_RELEASE_TABLET | Freq: Every day | ORAL | Status: DC
  Administered 2015-05-27: 81 mg via ORAL
  Filled 2015-05-26: qty 1

## 2015-05-26 MED ORDER — CARVEDILOL 3.125 MG PO TABS
3.1250 mg | ORAL_TABLET | Freq: Two times a day (BID) | ORAL | Status: DC
  Administered 2015-05-26 – 2015-05-28 (×4): 3.125 mg via ORAL
  Filled 2015-05-26 (×4): qty 1

## 2015-05-26 NOTE — Progress Notes (Addendum)
ANTICOAGULATION CONSULT NOTE - Initial Consult  Pharmacy Consult for apixiban Indication: atrial fibrillation  Allergies  Allergen Reactions  . Ace Inhibitors Other (See Comments)    muscle pain. Tolerates ARBs.   . Codeine Other (See Comments)    "head wants to explode."  . Penicillins Swelling    "started at point of injection; w/in 3 min my upper arm was swollen 3 times normal"  . Lisinopril     Muscle Pain  . Statins Other (See Comments)    Myalgias per patient    Patient Measurements: Height: 5\' 3"  (160 cm) Weight: 133 lb (60.328 kg) IBW/kg (Calculated) : 56.9   Vital Signs: Temp: 97.5 F (36.4 C) (04/21 0732) Temp Source: Oral (04/21 0732) BP: 124/82 mmHg (04/21 1000) Pulse Rate: 59 (04/21 1000)  Labs:  Recent Labs  05/25/15 1353 05/25/15 1533 05/25/15 1612 05/26/15 0353  HGB 13.8  --   --   --   HCT 43.3  --   --   --   PLT 237  --   --   --   LABPROT  --  14.7  --   --   INR  --  1.13  --   --   CREATININE 1.20  --   --  1.07  TROPONINI  --   --  0.29*  --     Estimated Creatinine Clearance: 51.7 mL/min (by C-G formula based on Cr of 1.07).   Medical History: Past Medical History  Diagnosis Date  . ICD (implantable cardiac defibrillator) in place   . CAD (coronary artery disease), autologous vein bypass graft   . Allergic rhinitis, cause unspecified   . HLD (hyperlipidemia)   . Diverticulosis     by CT scan  . Paroxysmal ventricular tachycardia (HCC)   . Atrial fibrillation (HCC)   . Acute myocardial infarction, unspecified site, episode of care unspecified 1995    Pt living in [place], no stent, ?PTCA  . Anginal pain (HCC)    . Acute lower GI bleeding 12/11/2011    "first time" (12/11/2011)  . Chronic systolic CHF (congestive heart failure), NYHA class 2 (HCC)     Reports EF of 25%.   . Arthritis     knees, back  . Insomnia   . Anxiety   . Cervical herniated disc     told not to lift >10 lbs  . COPD (chronic obstructive pulmonary  disease) (HCC) 11/2012    by xray  . Perennial allergic rhinitis     only to dust mites    Medications:  Prescriptions prior to admission  Medication Sig Dispense Refill Last Dose  . albuterol (PROVENTIL HFA;VENTOLIN HFA) 108 (90 BASE) MCG/ACT inhaler Inhale 2 puffs into the lungs every 6 (six) hours as needed. As needed for shortness of breath.   PRN  . amiodarone (PACERONE) 200 MG tablet Take 1 tablet (200 mg total) by mouth daily. 90 tablet 3 05/25/2015 at Unknown time  . aspirin 325 MG EC tablet Take 325 mg by mouth daily.   05/25/2015 at Unknown time  . benzonatate (TESSALON) 200 MG capsule TAKE 1 CAPSULE BY MOUTH 3 TIMES DAILY AS NEEDED FOR COUGH 30 capsule 1 05/25/2015 at Unknown time  . carvedilol (COREG) 6.25 MG tablet Take 1 tablet (6.25 mg total) by mouth 2 (two) times daily with a meal. 60 tablet 11 05/25/2015 at 0700  . diazepam (VALIUM) 5 MG tablet TAKE 1 TABLET BY MOUTH EVERY 6 HOURS AS NEEDED 45 tablet 0  1 week  . diclofenac sodium (VOLTAREN) 1 % GEL Apply 1 application topically 3 (three) times daily. 1 Tube 3 05/22/2015  . digoxin (LANOXIN) 0.125 MG tablet Take one tablet by mouth Mon-Fri only 90 tablet 3 05/25/2015 at Unknown time  . doxycycline (VIBRA-TABS) 100 MG tablet Take 1 tablet (100 mg total) by mouth 2 (two) times daily. 20 tablet 0 05/25/2015 at Unknown time  . fexofenadine (ALLEGRA) 180 MG tablet Take 180 mg by mouth daily as needed for allergies or rhinitis.   2-3 days  . furosemide (LASIX) 20 MG tablet TAKE 1 TABLET BY MOUTH FOR 3 DAYS THEN AS NEEDED 90 tablet 3 05/24/2015 at Unknown time  . HYDROcodone-acetaminophen (NORCO/VICODIN) 5-325 MG tablet TAKE 1 TABLET BY MOUTH EVERY 6 HOURS AS NEEDED FOR PAIN 60 tablet 0 3 weeks  . losartan (COZAAR) 50 MG tablet Take 0.5 tablets (25 mg total) by mouth daily.   05/25/2015 at Unknown time  . Multiple Vitamins-Minerals (MULTIVITAMIN ADULTS 50+) TABS Take 1 tablet by mouth daily.   05/25/2015 at Unknown time  . nitroGLYCERIN  (NITROSTAT) 0.4 MG SL tablet Place 1 tablet (0.4 mg total) under the tongue every 5 (five) minutes as needed. For chest pain. 30 tablet 1 PRN  . promethazine (PHENERGAN) 25 MG tablet TAKE 1 TABLET BY MOUTH EVERY 6 HOURS AS NEEDED FOR NAUSEA/VOMITING 30 tablet 1 05/24/2015 at Unknown time  . ranitidine (ZANTAC) 150 MG capsule Take 150 mg by mouth every evening.   05/24/2015 at Unknown time  . traMADol (ULTRAM) 50 MG tablet TAKE 1 TO 2 TABLETS BY MOUTH EVERY 8 HOURS AS NEEDED 100 tablet 1 3-4 days  . zolpidem (AMBIEN CR) 12.5 MG CR tablet TAKE 1 TABLET BY MOUTH AT BEDTIME 30 tablet 5 05/24/2015 at Unknown time  . metaxalone (SKELAXIN) 800 MG tablet TAKE 1 TABLET BY MOUTH 3 TIMES A DAY AS NEEDED FOR PAIN. 90 tablet 1 05/23/2015   Scheduled:  . apixaban  5 mg Oral BID  . aspirin  325 mg Oral Daily  . carvedilol  3.125 mg Oral BID WC  . [START ON 05/29/2015] digoxin  0.125 mg Oral Once per day on Mon Wed Fri  . doxycycline  100 mg Oral BID  . famotidine  20 mg Oral QHS  . loratadine  10 mg Oral Daily  . losartan  25 mg Oral Daily  . multivitamin with minerals  1 tablet Oral Daily    Assessment: 70 yo male here with VT and noted with afib (CHADSVASC= 3) to begin apixiban. Lovenox 40mg  sq last given 4/20.  -Wt= 60kg, SCr= 1.07, CrCl ~ 50 -hg= 13.8, plt= 237  Goal of Therapy:  Monitor platelets by anticoagulation protocol: Yes   Plan:  -apixiban 5mg  po bid -consider decreasing ASA to 81mg /d (noted history of CAD) -discontinue lovenox -Will provide patient education  Harland German, Pharm D 05/26/2015 11:15 AM

## 2015-05-26 NOTE — Discharge Instructions (Addendum)
**  PLEASE REMEMBER TO BRING ALL OF YOUR MEDICATIONS TO EACH OF YOUR FOLLOW-UP OFFICE VISITS.  **NO DRIVING X 6 MONTHS.  Information on my medicine - ELIQUIS (apixaban)  This medication education was reviewed with me or my healthcare representative as part of my discharge preparation.    Why was Eliquis prescribed for you? Eliquis was prescribed for you to reduce the risk of a blood clot forming that can cause a stroke if you have a medical condition called atrial fibrillation (a type of irregular heartbeat).  What do You need to know about Eliquis ? Take your Eliquis TWICE DAILY - one tablet in the morning and one tablet in the evening with or without food. If you have difficulty swallowing the tablet whole please discuss with your pharmacist how to take the medication safely.  Take Eliquis exactly as prescribed by your doctor and DO NOT stop taking Eliquis without talking to the doctor who prescribed the medication.  Stopping may increase your risk of developing a stroke.  Refill your prescription before you run out.  After discharge, you should have regular check-up appointments with your healthcare provider that is prescribing your Eliquis.  In the future your dose may need to be changed if your kidney function or weight changes by a significant amount or as you get older.  What do you do if you miss a dose? If you miss a dose, take it as soon as you remember on the same day and resume taking twice daily.  Do not take more than one dose of ELIQUIS at the same time to make up a missed dose.  Important Safety Information A possible side effect of Eliquis is bleeding. You should call your healthcare provider right away if you experience any of the following: ? Bleeding from an injury or your nose that does not stop. ? Unusual colored urine (red or dark brown) or unusual colored stools (red or black). ? Unusual bruising for unknown reasons. ? A serious fall or if you hit your head  (even if there is no bleeding).  Some medicines may interact with Eliquis and might increase your risk of bleeding or clotting while on Eliquis. To help avoid this, consult your healthcare provider or pharmacist prior to using any new prescription or non-prescription medications, including herbals, vitamins, non-steroidal anti-inflammatory drugs (NSAIDs) and supplements.  This website has more information on Eliquis (apixaban): http://www.eliquis.com/eliquis/home     10 Habits of Highly Healthy People  [facility] wants to help you get well and stay well.  Live a longer, healthier life by practicing healthy habits every day.  1.  Visit your primary care provider regularly. 2.  Make time for family and friends.  Healthy relationships are important. 3.  Take medications as directed by your provider. 4.  Maintain a healthy weight and a trim waistline. 5.  Eat healthy meals and snacks, rich in fruits, vegetables, whole grains, and lean proteins. 6.  Get moving every day - aim for 150 minutes of moderate physical activity each week. 7.  Don't smoke. 8.  Avoid alcohol or drink in moderation. 9.  Manage stress through meditation or mindful relaxation. 10.  Get seven to nine hours of quality sleep each night.  Want more information on healthy habits?  To learn more about these and other healthy habits, visit [email or web address]. _____________

## 2015-05-26 NOTE — Progress Notes (Signed)
Patient Name: [name] Date of Encounter: 05/26/2015  Principal Problem:   Ventricular fibrillation [facility]) Active Problems:   Coronary atherosclerosis   Chronic systolic CHF (congestive heart failure) (HCC)   VT (ventricular tachycardia) (HCC)   VF (ventricular fibrillation) [facility])   Primary Cardiologist: Dr [name]  Patient Profile: 70 y.o. male with a history of ischemic cardiomyopathy, chronic systolic heart failure, class III, atrial fibrillation, unwilling to take any anticoagulation, ventricular tachycardia, status post MDT ICD implantation, admitted 04/20 w/ VT storm  SUBJECTIVE: No palpitations overnight. No presyncope. SOB at baseline. Willing to take Eliquis, wants the Watchman device. He gets occasional chest pain, associated with chest wall tenderness, relieved by stretching.  OBJECTIVE Filed Vitals:   05/26/15 0300 05/26/15 0400 05/26/15 0500 05/26/15 0600  BP: 99/63 113/74 103/64 104/71  Pulse: 55 55 54 51  Temp:  97.5 F (36.4 C)    TempSrc:  Oral    Resp: Height:   (1.6 m)    Weight:  133 lb (60.328 kg)    SpO2: 96% 98% 98% 99%    Intake/Output Summary (Last 24 hours) at 05/26/15 0732 Last data filed at 05/26/15 0500  Gross per 24 hour  Intake 817.68 ml  Output      0 ml  Net 817.68 ml   Filed Weights   05/25/15 2045 05/26/15 0400  Weight: 132 lb 1.6 oz (59.92 kg) 133 lb (60.328 kg)    PHYSICAL EXAM General: Well developed, well nourished, male in no acute distress. Head: Normocephalic, atraumatic.  Neck: Supple without bruits, JVD not elevated. Lungs:  Resp regular and unlabored, decreased BS bases. Heart: RRR, S1, S2, no S3, S4, soft murmur; no rub. Abdomen: Soft, non-tender, non-distended, BS + x 4.  Extremities: No clubbing, cyanosis, edema.  Neuro: Alert and oriented X 3. Moves all extremities spontaneously. Psych: Normal affect.  LABS: CBC: Recent Labs  05/25/15 1353  WBC 10.1  HGB 13.8  HCT 43.3  MCV  94.1  PLT 237   INR: Recent Labs  05/25/15 1533  INR 1.13   Basic Metabolic Panel: Recent Labs  05/25/15 1353 05/25/15 1612 05/26/15 0353  NA 142  --  139  K 4.4  --  3.9  CL 107  --  106  CO2 24  --  25  GLUCOSE 183*  --  111*  BUN 16  --  16  CREATININE 1.20  --  1.07  CALCIUM 8.9  --  8.3*  MG  --  2.0  --   PHOS  --  1.9*  --    Liver Function Tests: Recent Labs  05/26/15 0353  AST 23  ALT 15*  ALKPHOS 63  BILITOT 0.5  PROT 5.5*  ALBUMIN 2.9*   Cardiac Enzymes: Recent Labs  05/25/15 1612  TROPONINI 0.29*    Recent Labs  05/25/15 1547  TROPIPOC 0.12*   Lab Results  Component Value Date   DIGOXIN 1.1 05/25/2015   BNP:  B NATRIURETIC PEPTIDE  Date/Time Value Ref Range Status  05/25/2015 04:12 PM 252.2* 0.0 - 100.0 pg/mL Final   TELE:  SR, S brady, PVCs, rare pairs, no longer runs, occ paced beats, rate 40      Radiology/Studies: Dg Chest Portable 1 View 05/25/2015  CLINICAL DATA:  Syncope 1 week ago. Three defibrillator discharges today. Initial encounter. EXAM: PORTABLE CHEST 1 VIEW COMPARISON:  PA and lateral chest 11/12/2012. FINDINGS: AICD is unchanged in appearance. There  is cardiomegaly without edema. No pneumothorax or pleural effusion. Postoperative change right shoulder noted. IMPRESSION: Cardiomegaly without acute disease. Electronically Signed   By: Drusilla Kanner M.D.   On: 05/25/2015 15:46     Current Medications:  . aspirin  325 mg Oral Daily  . carvedilol  6.25 mg Oral BID WC  . digoxin  0.125 mg Oral Once per day on Mon Tue Wed Thu Fri  . doxycycline  100 mg Oral BID  . enoxaparin (LOVENOX) injection  40 mg Subcutaneous Q24H  . famotidine  20 mg Oral QHS  . loratadine  10 mg Oral Daily  . losartan  25 mg Oral Daily  . multivitamin with minerals  1 tablet Oral Daily   . amiodarone 30 mg/hr (05/26/15 0000)    ASSESSMENT AND PLAN: Principal Problem:  Ventricular fibrillation (HCC) - admit, change amio to IV for 24-48  hours to speed loading - MD to advise if device parameters may need to be changed - K+ 3.9, Mg 2.0  - Dig level upper normal, dose decreased to 5 days/week, with bradycardia, will decrease to 3 days/week - with bradycardia, decrease Coreg - annual eye exams.  Active Problems:  Coronary atherosclerosis - mild elevation in ez c/w tachycardia/shocks, do not think acute coronary closure - moderate CAD by cath 2004 - no recent ischemic symptoms - continue ASA, BB, ARB as BP and baseline HR will allow. - he has been intolerant of statins in the past - no further workup at this time   Chronic systolic CHF (congestive heart failure) (HCC) - follow daily weights, I/O - prn Lasix for weight gain   VT (ventricular tachycardia) (HCC) - ?has been having below his detection rate - continue amio   PAF - Pt has agreed to anticoagulation w/ Eliquis - CHADS2VASC=4 - eval for Watchman after d/c   Elevated lactic acid level - in the setting of ICD shocks - no acute illness by sx, WBC and CXR OK - follow  Plan: MD advise on if we can change to po amio and tx telemetry today or in am.  Signed, Theodore Demark , PA-C 7:32 AM 05/26/2015

## 2015-05-26 NOTE — Progress Notes (Signed)
[facility] states will be on eliquis. Gave pt 30day free eliquis card. Pt states he does have medicare d plan for meds.

## 2015-05-26 NOTE — Care Management Note (Signed)
Case Management Note  Patient Details  Name: [name] MRN: [id number] Date of Birth: 09/24/1945  Subjective/Objective:            Adm w v fib        Action/Plan: lives w fam, pcp dr Para March   Expected Discharge Date:                  Expected Discharge Plan:  Home/Self Care  In-House Referral:     Discharge planning Services     Post Acute Care Choice:    Choice offered to:     DME Arranged:    DME Agency:     HH Arranged:    HH Agency:     Status of Service:     Medicare Important Message Given:    Date Medicare IM Given:    Medicare IM give by:    Date Additional Medicare IM Given:    Additional Medicare Important Message give by:     If discussed at Long Length of Stay Meetings, dates discussed:    Additional Comments: ur review done  Hanley Hays, RN 05/26/2015, 7:18 AM

## 2015-05-27 DIAGNOSIS — I5022 Chronic systolic (congestive) heart failure: Secondary | ICD-10-CM

## 2015-05-27 DIAGNOSIS — I2510 Atherosclerotic heart disease of native coronary artery without angina pectoris: Secondary | ICD-10-CM

## 2015-05-27 DIAGNOSIS — I481 Persistent atrial fibrillation: Secondary | ICD-10-CM

## 2015-05-27 MED ORDER — AMIODARONE HCL 200 MG PO TABS
400.0000 mg | ORAL_TABLET | Freq: Two times a day (BID) | ORAL | Status: DC
  Administered 2015-05-27 – 2015-05-28 (×3): 400 mg via ORAL
  Filled 2015-05-27 (×3): qty 2

## 2015-05-27 NOTE — Progress Notes (Signed)
Transfer from [facility] to 3E16 . VSS A&OX4 no C/O noted or voiced . Oriented to room .

## 2015-05-27 NOTE — Progress Notes (Signed)
   SUBJECTIVE: The patient is doing well today.  At this time, he denies chest pain, shortness of breath, or any new concerns.  [name] apixaban  5 mg Oral BID  . aspirin  81 mg Oral Daily  . carvedilol  3.125 mg Oral BID WC  . [START ON 05/29/2015] digoxin  0.125 mg Oral Once per day on Mon Wed Fri  . doxycycline  100 mg Oral BID  . famotidine  20 mg Oral QHS  . loratadine  10 mg Oral Daily  . losartan  25 mg Oral Daily  . multivitamin with minerals  1 tablet Oral Daily   . amiodarone 30 mg/hr (05/27/15 0357)    OBJECTIVE: Physical Exam: Filed Vitals:   05/27/15 0800 05/27/15 0900 05/27/15 1109 05/27/15 1200  BP: 123/79 134/86 119/64 124/74  Pulse: 59 60    Temp:   97.5 F (36.4 C)   TempSrc:   Oral   Resp: 18 17 20 19   Height:      Weight:      SpO2: 100% 98% 99%     Intake/Output Summary (Last 24 hours) at 05/27/15 1415 Last data filed at 05/27/15 0900  Gross per 24 hour  Intake  978.1 ml  Output   1000 ml  Net  -21.9 ml    Telemetry reveals sinus rhythm  GEN- The patient is well appearing, alert and oriented x 3 today.   Head- normocephalic, atraumatic Eyes-  Sclera clear, conjunctiva pink Ears- hearing intact Oropharynx- clear Neck- supple,  Lungs- Clear to ausculation bilaterally, normal work of breathing Heart- Regular rate and rhythm, no murmurs, rubs or gallops, PMI not laterally displaced GI- soft, NT, ND, + BS Extremities- no clubbing, cyanosis, or edema Skin- no rash or lesion Psych- euthymic mood, full affect Neuro- strength and sensation are intact  LABS: Basic Metabolic Panel:  Recent Labs  76/81/15 1353 05/25/15 1612 05/26/15 0353  NA 142  --  139  K 4.4  --  3.9  CL 107  --  106  CO2 24  --  25  GLUCOSE 183*  --  111*  BUN 16  --  16  CREATININE 1.20  --  1.07  CALCIUM 8.9  --  8.3*  MG  --  2.0  --   PHOS  --  1.9*  --    Liver Function Tests:  Recent Labs  05/26/15 0353  AST 23  ALT 15*  ALKPHOS 63  BILITOT 0.5  PROT 5.5*   ALBUMIN 2.9*   No results for input(s): LIPASE, AMYLASE in the last 72 hours. CBC:  Recent Labs  05/25/15 1353  WBC 10.1  HGB 13.8  HCT 43.3  MCV 94.1  PLT 237   Cardiac Enzymes:  Recent Labs  05/25/15 1612  TROPONINI 0.29*    ASSESSMENT AND PLAN:  Principal Problem:   Ventricular fibrillation (HCC) Active Problems:   Coronary atherosclerosis   Chronic systolic CHF (congestive heart failure) (HCC)   VT (ventricular tachycardia) (HCC)   VF (ventricular fibrillation) (HCC)  1. VT Doing well with IV amiodarone Will stop IV amiodarone and start oral amiodarone 400mg  BID today No driving x 6 months  2. Chronic systolic dysfunction Appears euvolemic No changes  3. CAD No ischemic symptoms No further workup planned  4. afib Started on eliquis by Dr [name] Per Dr [name], may need to consider watchman if further bleeding  Transfer to telemetry  Anticipate discharge tomorrow  Hillis Range, MD 05/27/2015 2:15 PM

## 2015-05-27 NOTE — Progress Notes (Signed)
Pt beginning to drop HR into 40's. Currently asleep. MD notified. Plan to continue to monitor. No med changes.

## 2015-05-28 ENCOUNTER — Encounter (HOSPITAL_COMMUNITY): Payer: Self-pay | Admitting: Nurse Practitioner

## 2015-05-28 DIAGNOSIS — I4819 Other persistent atrial fibrillation: Secondary | ICD-10-CM

## 2015-05-28 DIAGNOSIS — I472 Ventricular tachycardia: Secondary | ICD-10-CM

## 2015-05-28 LAB — CBC
HEMATOCRIT: 39.4 % (ref 39.0–52.0)
Hemoglobin: 12.5 g/dL — ABNORMAL LOW (ref 13.0–17.0)
MCH: 29.2 pg (ref 26.0–34.0)
MCHC: 31.7 g/dL (ref 30.0–36.0)
MCV: 92.1 fL (ref 78.0–100.0)
Platelets: 218 10*3/uL (ref 150–400)
RBC: 4.28 MIL/uL (ref 4.22–5.81)
RDW: 13.5 % (ref 11.5–15.5)
WBC: 8.6 10*3/uL (ref 4.0–10.5)

## 2015-05-28 LAB — BASIC METABOLIC PANEL
Anion gap: 9 (ref 5–15)
BUN: 15 mg/dL (ref 6–20)
CHLORIDE: 106 mmol/L (ref 101–111)
CO2: 25 mmol/L (ref 22–32)
Calcium: 8.8 mg/dL — ABNORMAL LOW (ref 8.9–10.3)
Creatinine, Ser: 0.95 mg/dL (ref 0.61–1.24)
GFR calc non Af Amer: >60 mL/min (ref >60)
Glucose, Bld: 113 mg/dL — ABNORMAL HIGH (ref 65–99)
POTASSIUM: 3.6 mmol/L (ref 3.5–5.1)
SODIUM: 140 mmol/L (ref 135–145)

## 2015-05-28 LAB — MAGNESIUM: MAGNESIUM: 1.7 mg/dL (ref 1.7–2.4)

## 2015-05-28 MED ORDER — DIGOXIN 125 MCG PO TABS: ORAL_TABLET | ORAL | Status: DC

## 2015-05-28 MED ORDER — APIXABAN 5 MG PO TABS: 5.0000 mg | ORAL_TABLET | Freq: Two times a day (BID) | ORAL | Status: DC

## 2015-05-28 MED ORDER — AMIODARONE HCL 400 MG PO TABS: 400.0000 mg | ORAL_TABLET | Freq: Two times a day (BID) | ORAL | Status: DC

## 2015-05-28 MED ORDER — CARVEDILOL 6.25 MG PO TABS: 3.1250 mg | ORAL_TABLET | Freq: Two times a day (BID) | ORAL | Status: DC

## 2015-05-28 MED ORDER — ASPIRIN 81 MG PO TBEC: 81.0000 mg | DELAYED_RELEASE_TABLET | Freq: Every day | ORAL | Status: DC

## 2015-05-28 NOTE — Progress Notes (Signed)
SUBJECTIVE: The patient is doing well today.  Wants to go home.  At this time, he denies chest pain, shortness of breath, or any new concerns.  [name] amiodarone  400 mg Oral BID  . apixaban  5 mg Oral BID  . carvedilol  3.125 mg Oral BID WC  . [START ON 05/29/2015] digoxin  0.125 mg Oral Once per day on Mon Wed Fri  . doxycycline  100 mg Oral BID  . famotidine  20 mg Oral QHS  . loratadine  10 mg Oral Daily  . losartan  25 mg Oral Daily  . multivitamin with minerals  1 tablet Oral Daily      OBJECTIVE: Physical Exam: Filed Vitals:   05/27/15 1618 05/27/15 1714 05/27/15 2045 05/28/15 0407  BP: 138/90 133/74 123/76 131/64  Pulse:  59 69 60  Temp: 97.7 F (36.5 C) 97.7 F (36.5 C) 97.8 F (36.6 C) 97.8 F (36.6 C)  TempSrc: Oral Oral Oral Oral  Resp: 17 20 20 18   Height:  5\' 3"  (1.6 m)    Weight:  133 lb 13.1 oz (60.7 kg)  136 lb 1.6 oz (61.735 kg)  SpO2:  97% 98% 98%    Intake/Output Summary (Last 24 hours) at 05/28/15 0902 Last data filed at 05/28/15 0849  Gross per 24 hour  Intake 1296.2 ml  Output      0 ml  Net 1296.2 ml    Telemetry reveals sinus rhythm, no VT  GEN- The patient is well appearing, alert and oriented x 3 today.   Head- normocephalic, atraumatic Eyes-  Sclera clear, conjunctiva pink Ears- hearing intact Oropharynx- clear Neck- supple,  Lungs- Clear to ausculation bilaterally, normal work of breathing Heart- Regular rate and rhythm, no murmurs, rubs or gallops, PMI not laterally displaced GI- soft, NT, ND, + BS Extremities- no clubbing, cyanosis, or edema Skin- no rash or lesion Psych- euthymic mood, full affect Neuro- strength and sensation are intact  LABS: Basic Metabolic Panel:  Recent Labs  35/78/97 1612 05/26/15 0353 05/28/15 0300  NA  --  139 140  K  --  3.9 3.6  CL  --  106 106  CO2  --  25 25  GLUCOSE  --  111* 113*  BUN  --  16 15  CREATININE  --  1.07 0.95  CALCIUM  --  8.3* 8.8*  MG 2.0  --  1.7  PHOS 1.9*  --   --      Liver Function Tests:  Recent Labs  05/26/15 0353  AST 23  ALT 15*  ALKPHOS 63  BILITOT 0.5  PROT 5.5*  ALBUMIN 2.9*   No results for input(s): LIPASE, AMYLASE in the last 72 hours. CBC:  Recent Labs  05/25/15 1353 05/28/15 0300  WBC 10.1 8.6  HGB 13.8 12.5*  HCT 43.3 39.4  MCV 94.1 92.1  PLT 237 218   Cardiac Enzymes:  Recent Labs  05/25/15 1612  TROPONINI 0.29*    ASSESSMENT AND PLAN:   1. VT Doing well with amiodarone 400mg  BID Will need close follow-up with Dr [name] (3-4 weeks) No driving x 6 months  2. Chronic systolic dysfunction Appears euvolemic No changes  3. CAD No ischemic symptoms No further workup planned  4. afib Started on eliquis by Dr [name] Per Dr [name], may need to consider watchman if further bleeding  DC to home today Will need to follow-up with Dr [name] in 3-4 weeks  Hillis Range, MD 05/28/2015 9:02 AM

## 2015-05-28 NOTE — Discharge Summary (Signed)
Discharge Summary    Patient ID: [name],  MRN: [id number], DOB/AGE: 1946/01/15 70 y.o.  Admit date: 05/25/2015 Discharge date: 05/28/2015  Primary Care Provider: Crawford Givens Primary Cardiologist: G. [name], MD   Discharge Diagnoses    Principal Problem:   Ventricular fibrillation (HCC)/ VT (ventricular tachycardia) (HCC)  Active Problems:   Coronary atherosclerosis   Ischemic cardiomyopathy   Syncope and collapse   Chronic systolic CHF (congestive heart failure) (HCC)   Hyperlipidemia   Automatic implantable cardioverter-defibrillator in situ   A-fib (HCC)  Allergies Allergies  Allergen Reactions  . Ace Inhibitors Other (See Comments)    muscle pain. Tolerates ARBs.   . Codeine Other (See Comments)    "head wants to explode."  . Penicillins Swelling    "started at point of injection; w/in 3 min my upper arm was swollen 3 times normal"  . Lisinopril     Muscle Pain  . Statins Other (See Comments)    Myalgias per patient    Diagnostic Studies/Procedures    None _____________   History of Present Illness     70 y/o ? with a h/o ICM, chronic systolic CHF, Afib, CAD, and VT s/p [employer] AICD.  He was recently seen in clinic following a syncopal spell and was noted on device interrogation to have had 2 appropriate ICD shocks for VT at 200 bpm.  He was prescribed amiodarone 200 mg daily but did not start it right away.  On 4/14, he had a brief syncopal spell while walking in the grocery store but apparently recovered quickly and did not seek medical advise.  On 4/17, he awoke from sleep after he felt that he had been shocked by his AICD.  He was initially scheduled to be seen in the office that day but then had a second shock and sent a remote transmission, which showed two episodes of VF and he was advised to present to the ED for evaluation.  There, he was hemodynamically stable and was admitted for further evaluation.  Hospital Course     Consultants:  None   Following admission, Mr. Bergland was placed on amiodarone IV infusion without any further VF/VT.  His ICD was reprogrammed to provide more anti-tachycardia pacing.  In the setting of ICD shocks, he did have a rise of his troponin to 0.29.  He did not have any chest pain and it was not felt that he required an ischemic evaluation.  Also in the setting of ICD shocks, he converted to sinus rhythm/bradycardia with a wide first degree AVB and ventricular pacing on demand.  His coreg dose was reduced to 3.125 mg BID while his digoxin dose was reduced to MWF dosing only (prev on M  F dosing).  He was also started on eliquis 5 mg BID and as he has a h/o GIB in 2013, it was felt that if GI bleeding becomes an issue, he could be considered for a Watchman device in the future.  IV amiodarone was converted to PO amiodarone at 400 mg BID on 4/21.  VT/VF has remained quiescent and he is felt to be ready for discharge today.  He has follow-up with Dr. [name] in 3 wks.  Of note, prior to his admission, his TSH was evaluated and was found to be low at 0.16.  Both FT3 and FT4 were within normal limits.  His TFT's will require close outpatient follow-up in the setting of amiodarone initiation. _____________  Discharge Vitals Blood pressure 131/64,  pulse 60, temperature 97.8 F (36.6 C), temperature source Oral, resp. rate 18, height 5\' 3"  (1.6 m), weight 136 lb 1.6 oz (61.735 kg), SpO2 98 %.  Filed Weights   05/27/15 0600 05/27/15 1714 05/28/15 0407  Weight: 133 lb 13.1 oz (60.7 kg) 133 lb 13.1 oz (60.7 kg) 136 lb 1.6 oz (61.735 kg)    Labs & Radiologic Studies    CBC  Recent Labs  05/25/15 1353 05/28/15 0300  WBC 10.1 8.6  HGB 13.8 12.5*  HCT 43.3 39.4  MCV 94.1 92.1  PLT 237 218   Basic Metabolic Panel  Recent Labs  05/25/15 1612 05/26/15 0353 05/28/15 0300  NA  --  139 140  K  --  3.9 3.6  CL  --  106 106  CO2  --  25 25  GLUCOSE  --  111* 113*  BUN  --  16 15  CREATININE  --  1.07  0.95  CALCIUM  --  8.3* 8.8*  MG 2.0  --  1.7  PHOS 1.9*  --   --    Liver Function Tests  Recent Labs  05/26/15 0353  AST 23  ALT 15*  ALKPHOS 63  BILITOT 0.5  PROT 5.5*  ALBUMIN 2.9*   Cardiac Enzymes  Recent Labs  05/25/15 1612  TROPONINI 0.29*   Thyroid Function Tests Lab Results  Component Value Date   TSH 0.16* 05/12/2015       FT3 3.1; FT4 1.06 (05/12/2015) _____________  Dg Chest Portable 1 View  05/25/2015  CLINICAL DATA:  Syncope 1 week ago. Three defibrillator discharges today. Initial encounter. EXAM: PORTABLE CHEST 1 VIEW COMPARISON:  PA and lateral chest 11/12/2012. FINDINGS: AICD is unchanged in appearance. There is cardiomegaly without edema. No pneumothorax or pleural effusion. Postoperative change right shoulder noted. IMPRESSION: Cardiomegaly without acute disease. Electronically Signed   By: Drusilla Kanner M.D.   On: 05/25/2015 15:46   Disposition   Pt is being discharged home today in good condition.  Follow-up Plans & Appointments    Follow-up Information    Follow up with Lewayne Bunting, MD On 06/16/2015.   Specialty:  Cardiology   Why:  at 10:15AM   Contact information:   [address] [place] [address] [phone]        Discharge Medications   Current Discharge Medication List    START taking these medications   Details  apixaban (ELIQUIS) 5 MG TABS tablet Take 1 tablet (5 mg total) by mouth 2 (two) times daily. Qty: 60 tablet, Refills: 6      CONTINUE these medications which have CHANGED   Details  amiodarone (PACERONE) 400 MG tablet Take 1 tablet (400 mg total) by mouth 2 (two) times daily. Qty: 60 tablet, Refills: 3   Associated Diagnoses: Ventricular tachycardia (HCC)    aspirin 81 MG EC tablet Take 1 tablet (81 mg total) by mouth daily.    carvedilol (COREG) 6.25 MG tablet Take 0.5 tablets (3.125 mg total) by mouth 2 (two) times daily with a meal.    digoxin (LANOXIN) 0.125 MG tablet Take one  tablet by mouth on MWF only. Qty: 90 tablet, Refills: 3      CONTINUE these medications which have NOT CHANGED   Details  albuterol (PROVENTIL HFA;VENTOLIN HFA) 108 (90 BASE) MCG/ACT inhaler Inhale 2 puffs into the lungs every 6 (six) hours as needed. As needed for shortness of breath.    benzonatate (TESSALON) 200 MG capsule TAKE 1  CAPSULE BY MOUTH 3 TIMES DAILY AS NEEDED FOR COUGH Qty: 30 capsule, Refills: 1    diazepam (VALIUM) 5 MG tablet TAKE 1 TABLET BY MOUTH EVERY 6 HOURS AS NEEDED Qty: 45 tablet, Refills: 0    diclofenac sodium (VOLTAREN) 1 % GEL Apply 1 application topically 3 (three) times daily. Qty: 1 Tube, Refills: 3    doxycycline (VIBRA-TABS) 100 MG tablet Take 1 tablet (100 mg total) by mouth 2 (two) times daily. Qty: 20 tablet, Refills: 0    fexofenadine (ALLEGRA) 180 MG tablet Take 180 mg by mouth daily as needed for allergies or rhinitis.    furosemide (LASIX) 20 MG tablet TAKE 1 TABLET BY MOUTH FOR 3 DAYS THEN AS NEEDED Qty: 90 tablet, Refills: 3    HYDROcodone-acetaminophen (NORCO/VICODIN) 5-325 MG tablet TAKE 1 TABLET BY MOUTH EVERY 6 HOURS AS NEEDED FOR PAIN Qty: 60 tablet, Refills: 0    losartan (COZAAR) 50 MG tablet Take 0.5 tablets (25 mg total) by mouth daily.    Multiple Vitamins-Minerals (MULTIVITAMIN ADULTS 50+) TABS Take 1 tablet by mouth daily.    nitroGLYCERIN (NITROSTAT) 0.4 MG SL tablet Place 1 tablet (0.4 mg total) under the tongue every 5 (five) minutes as needed. For chest pain. Qty: 30 tablet, Refills: 1    promethazine (PHENERGAN) 25 MG tablet TAKE 1 TABLET BY MOUTH EVERY 6 HOURS AS NEEDED FOR NAUSEA/VOMITING Qty: 30 tablet, Refills: 1    ranitidine (ZANTAC) 150 MG capsule Take 150 mg by mouth every evening.    traMADol (ULTRAM) 50 MG tablet TAKE 1 TO 2 TABLETS BY MOUTH EVERY 8 HOURS AS NEEDED Qty: 100 tablet, Refills: 1    zolpidem (AMBIEN CR) 12.5 MG CR tablet TAKE 1 TABLET BY MOUTH AT BEDTIME Qty: 30 tablet, Refills: 5      metaxalone (SKELAXIN) 800 MG tablet TAKE 1 TABLET BY MOUTH 3 TIMES A DAY AS NEEDED FOR PAIN. Qty: 90 tablet, Refills: 1        Outstanding Labs/Studies   F/U BMET and CBC  @ f/u appt (new to Eliquis) Will need f/u LFT's, PFT's as outpt (new to amiodarone) He will need TFT's when he is seen in clinic on 5/12.  Duration of Discharge Encounter   Greater than 30 minutes including physician time.  Signed, Nicolasa Ducking NP 05/28/2015, 10:26 AM     Jarold Song

## 2015-05-28 NOTE — Progress Notes (Signed)
Patient refused bed alarm. Will continue to monitor patient. 

## 2015-05-29 ENCOUNTER — Telehealth: Payer: Self-pay | Admitting: *Deleted

## 2015-05-29 MED ORDER — CARVEDILOL 3.125 MG PO TABS: 3.1250 mg | ORAL_TABLET | Freq: Two times a day (BID) | ORAL | Status: DC

## 2015-05-29 NOTE — Telephone Encounter (Signed)
New rx sent, 3.125mg  BID.  He can break the old 6.25mg  tabs in half in the meantime, taking 1/2 tab BID, to use those up.   Thanks.

## 2015-05-29 NOTE — Telephone Encounter (Signed)
Patient advised.

## 2015-05-29 NOTE — Telephone Encounter (Signed)
PLEASE NOTE: All timestamps contained within this report are represented as [place] Standard Time. CONFIDENTIALTY NOTICE: This fax transmission is intended only for the addressee. It contains information that is legally privileged, confidential or otherwise protected from use or disclosure. If you are not the intended recipient, you are strictly prohibited from reviewing, disclosing, copying using or disseminating any of this information or taking any action in reliance on or regarding this information. If you have received this fax in error, please notify us immediately by telephone so that we can arrange for its return to [place]. Phone: [phone], Toll-Free: [phone], Fax: [phone] Page: 1 of 2 Call Id: [id number] [facility] Night - Client TELEPHONE ADVICE RECORD [facility] Patient Name: [name] Gender: Male DOB: 21-Nov-1945 Age: 70 Y 11 M 28 D Return Phone Number: [phone] (Primary) Address: City/State/Zip: [place] Client [facility] Night - Client Client Site [facility] - Night Physician Raechel Ache Contact Type Call Who Is Calling Pharmacy Call Type Pharmacy Send to RN Chief Complaint Paging or Request for Consult Reason for Call Request to change medication order Initial Comment Caller states pt was discharged from hospital and is now on a new dose of Carvedilol and needs to have dr call in new order. Additional Comment Pharmacy Name [facility] Pharmacist Name [facility] Pharmacy Number [phone] Translation No Nurse Assessment Nurse: Lars Pinks, RN, Earley Abide Date/Time Lamount Cohen Time): 05/28/2015 4:56:47 PM Confirm and document reason for call. If symptomatic, describe symptoms. You must click the next button to save text entered. ---Caller states pt was discharged from hospital and is now on a new dose of Carvedilol and needs to have dr call in new order. Has the patient traveled out of the  country within the last 30 days? ---Not Applicable Does the patient have any new or worsening symptoms? ---No Please document clinical information provided and list any resource used. ---Nurse to leave message on patient's mailbox instructing patient to call office in am to request prescription sent to patient's pharmacy. Patient currently has medication available to take until new prescription is sent. Call will be closed as a clinical call. Guidelines Guideline Title Affirmed Question Affirmed Notes Nurse Date/Time (Eastern Time) Disp. Time Lamount Cohen Time) Disposition Final User 05/28/2015 5:02:40 PM Pharmacy Call Lars Pinks, RN, Earley Abide Reason: To inform need to call back on Monday when office opens to obtain new order. Also to verify whether patient has enough medication to last through tomorrow. Dose needs to be lowered to 3.125 mg. Patient did not have a prescription for the new dose. 05/28/2015 5:04:42 PM Clinical Call Yes Lars Pinks, RN, Earley Abide PLEASE NOTE: All timestamps contained within this report are represented as [place] Standard Time. CONFIDENTIALTY NOTICE: This fax transmission is intended only for the addressee. It contains information that is legally privileged, confidential or otherwise protected from use or disclosure. If you are not the intended recipient, you are strictly prohibited from reviewing, disclosing, copying using or disseminating any of this information or taking any action in reliance on or regarding this information. If you have received this fax in error, please notify us immediately by telephone so that we can arrange for its return to [place]. Phone: [phone], Toll-Free: [phone], Fax: [phone] Page: 2 of 2 Call Id: [id number]

## 2015-05-31 ENCOUNTER — Other Ambulatory Visit: Payer: Self-pay | Admitting: *Deleted

## 2015-05-31 MED ORDER — METAXALONE 800 MG PO TABS: ORAL_TABLET | ORAL | Status: DC

## 2015-05-31 NOTE — Telephone Encounter (Signed)
Sent. Thanks.   

## 2015-05-31 NOTE — Telephone Encounter (Signed)
Last office visit 05/22/2015.  Last refilled 12/19/2014 for #90 with 1 refill.  Ok to refill?

## 2015-06-08 ENCOUNTER — Other Ambulatory Visit: Payer: Self-pay | Admitting: Family Medicine

## 2015-06-09 NOTE — Telephone Encounter (Signed)
Electronic refill request.  Nitrostat Last Filled:    30 tablet 1 08/17/2013  Diazepam Last Filled:    45 tablet 0 04/20/2015  Last office visit:   05/23/2015

## 2015-06-11 NOTE — Telephone Encounter (Signed)
Please call in diazepam.  Thanks.

## 2015-06-12 NOTE — Telephone Encounter (Signed)
Medication phoned to pharmacy.  

## 2015-06-16 ENCOUNTER — Ambulatory Visit (INDEPENDENT_AMBULATORY_CARE_PROVIDER_SITE_OTHER): Payer: Medicare Other | Admitting: Internal Medicine

## 2015-06-16 ENCOUNTER — Encounter: Payer: Self-pay | Admitting: Internal Medicine

## 2015-06-16 VITALS — BP 106/58 | HR 41 | Ht 63.0 in | Wt 143.4 lb

## 2015-06-16 DIAGNOSIS — I5022 Chronic systolic (congestive) heart failure: Secondary | ICD-10-CM | POA: Diagnosis not present

## 2015-06-16 DIAGNOSIS — I2510 Atherosclerotic heart disease of native coronary artery without angina pectoris: Secondary | ICD-10-CM

## 2015-06-16 DIAGNOSIS — I472 Ventricular tachycardia, unspecified: Secondary | ICD-10-CM

## 2015-06-16 LAB — CUP PACEART INCLINIC DEVICE CHECK
Battery Remaining Longevity: 98 mo
Date Time Interrogation Session: 20170512133548
HIGH POWER IMPEDANCE MEASURED VALUE: 58 Ohm
HighPow Impedance: 48 Ohm
Implantable Lead Implant Date: 20040722
Implantable Lead Location: 753860
Lead Channel Pacing Threshold Pulse Width: 0.4 ms
Lead Channel Sensing Intrinsic Amplitude: 3.75 mV
Lead Channel Setting Pacing Amplitude: 2.5 V
Lead Channel Setting Pacing Pulse Width: 0.4 ms
Lead Channel Setting Sensing Sensitivity: 0.3 mV
MDC IDC MSMT BATTERY VOLTAGE: 3.01 V
MDC IDC MSMT LEADCHNL RV IMPEDANCE VALUE: 380 Ohm
MDC IDC MSMT LEADCHNL RV IMPEDANCE VALUE: 437 Ohm
MDC IDC MSMT LEADCHNL RV PACING THRESHOLD AMPLITUDE: 0.75 V
MDC IDC MSMT LEADCHNL RV SENSING INTR AMPL: 3.75 mV
MDC IDC STAT BRADY RV PERCENT PACED: 31.42 %

## 2015-06-16 MED ORDER — AMIODARONE HCL 200 MG PO TABS: 200.0000 mg | ORAL_TABLET | Freq: Two times a day (BID) | ORAL | Status: DC

## 2015-06-16 NOTE — Patient Instructions (Addendum)
Medication Instructions:  Your physician has recommended you make the following change in your medication:  1) Decrease Amiodarone to 200 mg bid    Labwork: None ordered   Testing/Procedures: None ordered   Follow-Up: Your physician wants you to follow-up in: 6-8 weeks with Dr [name]   Any Other Special Instructions Will Be Listed Below (If Applicable).     If you need a refill on your cardiac medications before your next appointment, please call your pharmacy.

## 2015-06-16 NOTE — Progress Notes (Signed)
HPI [name] returns today for followup. He is a 70 year old man with an ischemic cardiomyopathy, chronic systolic heart failure, class III, chronic atrial fibrillation, previously unwilling to take any anticoagulation, ventricular tachycardia, status post ICD implantation. He was in the hospital several weeks ago with VT storm. He was placed on Eliquis as he converted back to NSR after being out of rhythm for over a year. The patient was placed on amiodarone to prevent VT. He has done well. He is interested in the Clifton procedure. He denies sob or chest pain.    Allergies  Allergen Reactions  . Ace Inhibitors Other (See Comments)    muscle pain. Tolerates ARBs.   . Codeine Other (See Comments)    "head wants to explode."  . Penicillins Swelling    "started at point of injection; w/in 3 min my upper arm was swollen 3 times normal"  . Lisinopril     Muscle Pain  . Statins Other (See Comments)    Myalgias per patient     Current Outpatient Prescriptions  Medication Sig Dispense Refill  . albuterol (PROVENTIL HFA;VENTOLIN HFA) 108 (90 BASE) MCG/ACT inhaler Inhale 2 puffs into the lungs every 6 (six) hours as needed. As needed for shortness of breath.    [name] amiodarone (PACERONE) 200 MG tablet Take 1 tablet (200 mg total) by mouth 2 (two) times daily. 180 tablet 3  . apixaban (ELIQUIS) 5 MG TABS tablet Take 1 tablet (5 mg total) by mouth 2 (two) times daily. 60 tablet 6  . aspirin 81 MG EC tablet Take 1 tablet (81 mg total) by mouth daily.    . benzonatate (TESSALON) 200 MG capsule TAKE 1 CAPSULE BY MOUTH 3 TIMES DAILY AS NEEDED FOR COUGH 30 capsule 1  . carvedilol (COREG) 3.125 MG tablet Take 1 tablet (3.125 mg total) by mouth 2 (two) times daily with a meal. 60 tablet 1  . diazepam (VALIUM) 5 MG tablet TAKE 1 TABLET BY MOUTH EVERY 6 HOURS AS NEEDED 45 tablet 0  . digoxin (LANOXIN) 0.125 MG tablet Take one tablet by mouth on MWF only. 90 tablet 3  . fexofenadine (ALLEGRA) 180 MG tablet  Take 180 mg by mouth daily as needed for allergies or rhinitis.    . furosemide (LASIX) 20 MG tablet TAKE 1 TABLET BY MOUTH FOR 3 DAYS THEN AS NEEDED 90 tablet 3  . HYDROcodone-acetaminophen (NORCO/VICODIN) 5-325 MG tablet TAKE 1 TABLET BY MOUTH EVERY 6 HOURS AS NEEDED FOR PAIN 60 tablet 0  . losartan (COZAAR) 50 MG tablet Take 0.5 tablets (25 mg total) by mouth daily.    . metaxalone (SKELAXIN) 800 MG tablet TAKE 1 TABLET BY MOUTH 3 TIMES A DAY AS NEEDED FOR PAIN. 90 tablet 1  . Multiple Vitamins-Minerals (MULTIVITAMIN ADULTS 50+) TABS Take 1 tablet by mouth daily.    [name] NITROSTAT 0.4 MG SL tablet PLACE 1 TABLET (0.4 MG TOTAL) UNDER THE TONGUE EVERY 5 (FIVE) MINUTES AS NEEDED. FOR CHEST PAIN. 25 tablet 1  . promethazine (PHENERGAN) 25 MG tablet TAKE 1 TABLET BY MOUTH EVERY 6 HOURS AS NEEDED FOR NAUSEA/VOMITING 30 tablet 1  . ranitidine (ZANTAC) 150 MG capsule Take 150 mg by mouth every evening.    . traMADol (ULTRAM) 50 MG tablet TAKE 1 TO 2 TABLETS BY MOUTH EVERY 8 HOURS AS NEEDED 100 tablet 1  . zolpidem (AMBIEN CR) 12.5 MG CR tablet TAKE 1 TABLET BY MOUTH AT BEDTIME 30 tablet 5   No current facility-administered medications for  this visit.   Facility-Administered Medications Ordered in Other Visits  Medication Dose Route Frequency Provider Last Rate Last Dose  . 0.45 % sodium chloride infusion   Intravenous Continuous Marinus Maw, MD      . 0.9 %  sodium chloride infusion  250 mL Intravenous Continuous Marinus Maw, MD      . chlorhexidine (HIBICLENS) 4 % liquid 4 application  60 mL Topical Once Marinus Maw, MD      . sodium chloride 0.9 % injection 3 mL  3 mL Intravenous Q12H Marinus Maw, MD      . sodium chloride 0.9 % injection 3 mL  3 mL Intravenous PRN Marinus Maw, MD         Past Medical History  Diagnosis Date  . ICD (implantable cardiac defibrillator) in place   . CAD (coronary artery disease), autologous vein bypass graft   . Allergic rhinitis, cause unspecified    . HLD (hyperlipidemia)   . Diverticulosis     by CT scan  . Paroxysmal ventricular tachycardia (HCC)   . Atrial fibrillation (HCC)     a. 05/2015 - converted to sinus in setting of ICD shocks; placed on eliquis 5 bid.  . Acute myocardial infarction, unspecified site, episode of care unspecified 1995    Pt living in [place], no stent, ?PTCA  . Acute lower GI bleeding 12/11/2011    "first time" (12/11/2011)  . Chronic systolic CHF (congestive heart failure), NYHA class 2 (HCC)     Reports EF of 25%.   . Arthritis     knees, back  . Insomnia   . Anxiety   . Cervical herniated disc     told not to lift >10 lbs  . COPD (chronic obstructive pulmonary disease) (HCC) 11/2012    by xray  . Perennial allergic rhinitis     only to dust mites  . VT (ventricular tachycardia) (HCC)     a. 05/2015 - VT storm with multiple ICD shocks-->Amio 400 BID.    ROS:   All systems reviewed and negative except as noted in the HPI.   Past Surgical History  Procedure Laterality Date  . Cardiac defibrillator placement  2004  . Tonsillectomy and adenoidectomy  ~ 1951  . Cholecystectomy  1/ 2012  . Shoulder arthroscopy w/ rotator cuff repair  twice    right (12/11/2011)  . Coronary angioplasty  1995    Pt thinks he got a balloon, living in [place], [place]  . Partial knee arthroplasty  ~ 2000    left  . Insert / replace / remove pacemaker  2004    Medtronic ICD  . Colonoscopy  01/08/2012    Procedure: COLONOSCOPY;  Surgeon: Iva Boop, MD;  Location: [facility] ENDOSCOPY;  Service: Endoscopy;  Laterality: N/A;  . Cataract extraction Right 01/2012  . Implantable cardioverter defibrillator generator change N/A 02/07/2012    Procedure: IMPLANTABLE CARDIOVERTER DEFIBRILLATOR GENERATOR CHANGE;  Surgeon: Marinus Maw, MD; Medtronic Evera XT VR single-chamber serial number [id number], Laterality: Left  . Cardiac catheterization  2004    LAD 30%, D1 30%, CFX-AV groove 70-80%, OM1 30%, EF 20-25%     Family History   Problem Relation Age of Onset  . Diabetes Father   . Tracheal cancer Father 64    smoker  . Stroke Mother   . Cancer Sister     left eye  . CAD Neg Hx   . Colon cancer Neg Hx   .  Prostate cancer Neg Hx      Social History   Social History  . Marital Status: Married    Spouse Name: N/A  . Number of Children: 0  . Years of Education: N/A   Occupational History  . [employer] truck driver (retired)    Social History Main Topics  . Smoking status: Current Every Day Smoker -- 0.25 packs/day for 50 years    Types: Cigarettes, Cigars  . Smokeless tobacco: Never Used     Comment: ~ 8 cigarettes daily  . Alcohol Use: No  . Drug Use: No  . Sexual Activity: No   Other Topics Concern  . Not on file   Social History Narrative   Lives with wife, married 1998   Grown children, 2 great grandchildren   Occupation: retired, was Presenter, broadcasting   Activity: walking, fishing   Diet: good water daily, fruits/vegetables rare      Wife is Product manager.    [id number], Human resources officer. No known agent orange exposure.       BP 106/58 mmHg  Pulse 41  Ht  (1.6 m)  Wt 143 lb 6.4 oz (65.046 kg)  BMI 25.41 kg/m2  Physical Exam:  Chronically ill appearing 70 yo man, NAD HEENT: Unremarkable Neck:  6 cm JVD, no thyromegally Lungs:  Clear except for rales in the bases. No wheezes or rhonchi. Well-healed ICD incision. HEART:  IRegular rate rhythm, no murmurs, no rubs, no clicks Abd:  soft, positive bowel sounds, no organomegally, no rebound, no guarding Ext:  2 plus pulses, no edema, no cyanosis, no clubbing Skin:  No rashes no nodules Neuro:  CN II through XII intact, motor grossly intact  ECG - Sinus bradycardia with ventricular pacing   DEVICE  Normal device function.  See PaceArt for details. Optivol is stable.  Assess/Plan:  1. VT - he has not had more VT since high dose amiodarone. He will reduce his dose down to 400 mg daily. 2. Chronic systolic heart failure - his symptoms are  controlled. No change in meds. 3. HTN - his blood pressure is well controlled. Will follow. 4. ICD - his Medtronic device is working normally. Will recheck in several months.  5. Atrial fib - he is at risk for stroke. I will refer him for Watchman as he has had major GI bleeding in the past and is not a good long term anti-coagulation candidate.  Leonia Reeves.D.

## 2015-06-20 ENCOUNTER — Telehealth: Payer: Self-pay | Admitting: Internal Medicine

## 2015-06-20 ENCOUNTER — Telehealth: Payer: Self-pay | Admitting: *Deleted

## 2015-06-20 DIAGNOSIS — I10 Essential (primary) hypertension: Secondary | ICD-10-CM

## 2015-06-20 NOTE — Telephone Encounter (Signed)
I would try to inc losartan from 1/2 tab (25mg ) up to 1 tab (50mg ).  If lightheaded at all then go back to his regular dose.  Update [place] as needed.  Thanks.

## 2015-06-20 NOTE — Telephone Encounter (Signed)
Pt calling re BP issues and questions re med-  BP reading 730am 154/67 pulse 41  pls advise [phone]

## 2015-06-20 NOTE — Telephone Encounter (Signed)
Pt provided a list of vitals: 5/12 PM 148/72, 40 5/13 AM 159/81, 44 5/13 PM 117/69, 40 5/15 AM 122/68, 40 5/16 AM 154/67, 41 5/16 @ 4pm 143/65, 48  Pt wanted to know if Dr. [name] felt like his medications needed any adjustments based on his vitals over the last few days.  He was concerned about systolic BP being a little elevated.  Pt's PCP, Dr. Para March, advised pt to go up on Losartan to 50mg  daily.  Pt wanted to know if Dr. [name] was in agreement with this?  Pt denies any lightheadedness or dizziness associated with low HR.  Pt states he feels fine, just concerned about his numbers.  Advised I would send message to Dr. [name] and his nurse for review and advisement.  Pt verbalized understanding and was appreciative for call back.

## 2015-06-20 NOTE — Telephone Encounter (Signed)
Patient advised and repeated instructions correctly. 

## 2015-06-20 NOTE — Telephone Encounter (Signed)
Pt left voicemail at Triage. Pt said he does have a cardiologist (Dr. [name]) but he wants to let Dr. Para March know he has been having high BP readings. Pt didn't know if this is normal for his age or should he be concerned. Pt said his BP has been running around 148/72, and last night his BP was 154/67

## 2015-06-22 NOTE — Telephone Encounter (Signed)
Discussed with Dr [name].  He is okay with increasing the Losartan.  Will need a BMP in 2 weeks. Called patient and discussed. He will increase and have a BMP on 07/10/15 at 2pm

## 2015-06-22 NOTE — Telephone Encounter (Signed)
Follow Up   Pt called to follow up on this messages//shanti

## 2015-06-28 ENCOUNTER — Ambulatory Visit (INDEPENDENT_AMBULATORY_CARE_PROVIDER_SITE_OTHER): Payer: Medicare Other | Admitting: Internal Medicine

## 2015-06-28 ENCOUNTER — Encounter: Payer: Medicare Other | Admitting: Internal Medicine

## 2015-06-28 ENCOUNTER — Encounter: Payer: Self-pay | Admitting: Nurse Practitioner

## 2015-06-28 VITALS — BP 110/64 | HR 42 | Ht 63.0 in | Wt 146.8 lb

## 2015-06-28 DIAGNOSIS — R001 Bradycardia, unspecified: Secondary | ICD-10-CM

## 2015-06-28 DIAGNOSIS — I481 Persistent atrial fibrillation: Secondary | ICD-10-CM | POA: Diagnosis not present

## 2015-06-28 DIAGNOSIS — I2510 Atherosclerotic heart disease of native coronary artery without angina pectoris: Secondary | ICD-10-CM | POA: Diagnosis not present

## 2015-06-28 DIAGNOSIS — I5022 Chronic systolic (congestive) heart failure: Secondary | ICD-10-CM

## 2015-06-28 DIAGNOSIS — I472 Ventricular tachycardia, unspecified: Secondary | ICD-10-CM

## 2015-06-28 DIAGNOSIS — I4901 Ventricular fibrillation: Secondary | ICD-10-CM

## 2015-06-28 DIAGNOSIS — I4819 Other persistent atrial fibrillation: Secondary | ICD-10-CM

## 2015-06-28 NOTE — Patient Instructions (Signed)
Medication Instructions: - Your physician recommends that you continue on your current medications as directed. Please refer to the Current Medication list given to you today.  Labwork: - none  Procedures/Testing: - none  Follow-Up: - with Dr. [name] as scheduled.  Any Additional Special Instructions Will Be Listed Below (If Applicable).     If you need a refill on your cardiac medications before your next appointment, please call your pharmacy.

## 2015-06-28 NOTE — Progress Notes (Signed)
Watchman Consult Note   Date:  06/28/2015   ID:  [name], DOB 02/11/1945, MRN [id number]  PCP:  Crawford Givens, MD  Primary Electrophysiologist: [name] Referring Physician: [name]   CC: to discuss Watchman implant    History of Present Illness: [name] is a 70 y.o. male who presents today for evaluation of left atrial appendage occluder.  He has persistent atrial fibrillation as well as ischemic cardiomyopathy, VT, sinus bradycardia, and chronic systolic heart failure.  The patient has been evaluated by their referring physician and is felt to be a poor candidate for long term OAC due to prior GI bleeding.  He therefore presents today for Watchman evaluation.    Today, he denies symptoms of palpitations, chest pain, shortness of breath, orthopnea, PND, lower extremity edema, claudication, dizziness, presyncope, syncope, bleeding, or neurologic sequela. The patient is tolerating medications without difficulties and is otherwise without complaint today.    Past Medical History  Diagnosis Date  . ICD (implantable cardiac defibrillator) in place   . CAD (coronary artery disease), autologous vein bypass graft   . Allergic rhinitis, cause unspecified   . HLD (hyperlipidemia)   . Diverticulosis     by CT scan  . Paroxysmal ventricular tachycardia (HCC)   . Atrial fibrillation (HCC)     a. 05/2015 - converted to sinus in setting of ICD shocks; placed on eliquis 5 bid.  . Acute myocardial infarction, unspecified site, episode of care unspecified 1995    Pt living in [place], no stent, ?PTCA  . Acute lower GI bleeding 12/11/2011    "first time" (12/11/2011)  . Chronic systolic CHF (congestive heart failure), NYHA class 2 (HCC)     Reports EF of 25%.   . Arthritis     knees, back  . Insomnia   . Anxiety   . Cervical herniated disc     told not to lift >10 lbs  . COPD (chronic obstructive pulmonary disease) (HCC) 11/2012    by xray  . Perennial allergic rhinitis    only to dust mites  . VT (ventricular tachycardia) (HCC)     a. 05/2015 - VT storm with multiple ICD shocks-->Amio 400 BID.   Past Surgical History  Procedure Laterality Date  . Cardiac defibrillator placement  2004  . Tonsillectomy and adenoidectomy  ~ 1951  . Cholecystectomy  1/ 2012  . Shoulder arthroscopy w/ rotator cuff repair  twice    right (12/11/2011)  . Coronary angioplasty  1995    Pt thinks he got a balloon, living in [place], [place]  . Partial knee arthroplasty  ~ 2000    left  . Insert / replace / remove pacemaker  2004    Medtronic ICD  . Colonoscopy  01/08/2012    Procedure: COLONOSCOPY;  Surgeon: Iva Boop, MD;  Location: [facility];  Service: Endoscopy;  Laterality: N/A;  . Cataract extraction Right 01/2012  . Implantable cardioverter defibrillator generator change N/A 02/07/2012    Procedure: IMPLANTABLE CARDIOVERTER DEFIBRILLATOR GENERATOR CHANGE;  Surgeon: Marinus Maw, MD; Medtronic Evera XT VR single-chamber serial number [id number], Laterality: Left  . Cardiac catheterization  2004    LAD 30%, D1 30%, CFX-AV groove 70-80%, OM1 30%, EF 20-25%     Current Outpatient Prescriptions  Medication Sig Dispense Refill  . amiodarone (PACERONE) 200 MG tablet Take 1 tablet (200 mg total) by mouth 2 (two) times daily. 180 tablet 3  . apixaban (ELIQUIS) 5 MG TABS tablet Take 1 tablet (  5 mg total) by mouth 2 (two) times daily. 60 tablet 6  . aspirin 81 MG EC tablet Take 1 tablet (81 mg total) by mouth daily.    . benzonatate (TESSALON) 200 MG capsule TAKE 1 CAPSULE BY MOUTH 3 TIMES DAILY AS NEEDED FOR COUGH 30 capsule 1  . carvedilol (COREG) 3.125 MG tablet Take 1 tablet (3.125 mg total) by mouth 2 (two) times daily with a meal. 60 tablet 1  . diazepam (VALIUM) 5 MG tablet TAKE 1 TABLET BY MOUTH EVERY 6 HOURS AS NEEDED 45 tablet 0  . digoxin (LANOXIN) 0.125 MG tablet Take one tablet by mouth on MWF only. 90 tablet 3  . fexofenadine (ALLEGRA) 180 MG tablet Take 180 mg by  mouth daily as needed for allergies or rhinitis.    . furosemide (LASIX) 20 MG tablet TAKE 1 TABLET BY MOUTH FOR 3 DAYS THEN AS NEEDED 90 tablet 3  . HYDROcodone-acetaminophen (NORCO/VICODIN) 5-325 MG tablet TAKE 1 TABLET BY MOUTH EVERY 6 HOURS AS NEEDED FOR PAIN 60 tablet 0  . losartan (COZAAR) 50 MG tablet Take 50 mg by mouth daily.    . metaxalone (SKELAXIN) 800 MG tablet TAKE 1 TABLET BY MOUTH 3 TIMES A DAY AS NEEDED FOR PAIN. 90 tablet 1  . Multiple Vitamins-Minerals (MULTIVITAMIN ADULTS 50+) TABS Take 1 tablet by mouth daily.    [name] NITROSTAT 0.4 MG SL tablet PLACE 1 TABLET (0.4 MG TOTAL) UNDER THE TONGUE EVERY 5 (FIVE) MINUTES AS NEEDED. FOR CHEST PAIN. 25 tablet 1  . promethazine (PHENERGAN) 25 MG tablet TAKE 1 TABLET BY MOUTH EVERY 6 HOURS AS NEEDED FOR NAUSEA/VOMITING 30 tablet 1  . ranitidine (ZANTAC) 150 MG capsule Take 150 mg by mouth every evening.    . traMADol (ULTRAM) 50 MG tablet TAKE 1 TO 2 TABLETS BY MOUTH EVERY 8 HOURS AS NEEDED 100 tablet 1  . zolpidem (AMBIEN CR) 12.5 MG CR tablet TAKE 1 TABLET BY MOUTH AT BEDTIME 30 tablet 5   No current facility-administered medications for this visit.   Facility-Administered Medications Ordered in Other Visits  Medication Dose Route Frequency Provider Last Rate Last Dose  . 0.45 % sodium chloride infusion   Intravenous Continuous Marinus Maw, MD      . 0.9 %  sodium chloride infusion  250 mL Intravenous Continuous Marinus Maw, MD      . chlorhexidine (HIBICLENS) 4 % liquid 4 application  60 mL Topical Once Marinus Maw, MD      . sodium chloride 0.9 % injection 3 mL  3 mL Intravenous Q12H Marinus Maw, MD      . sodium chloride 0.9 % injection 3 mL  3 mL Intravenous PRN Marinus Maw, MD        Allergies:   Ace inhibitors; Codeine; Penicillins; Lisinopril; and Statins   Social History:  The patient  reports that he has been smoking Cigarettes and Cigars.  He has a 12.5 pack-year smoking history. He has never used smokeless  tobacco. He reports that he does not drink alcohol or use illicit drugs.   Family History:  The patient's family history includes Cancer in his sister; Diabetes in his father; Stroke in his mother; Tracheal cancer (age of onset: 42) in his father. There is no history of CAD, Colon cancer, or Prostate cancer.    ROS:  Please see the history of present illness.   All other systems are reviewed and negative.    PHYSICAL EXAM: VS:  BP 110/64 mmHg  Pulse 42  Ht 5\' 3"  (1.6 m)  Wt 146 lb 12.8 oz (66.588 kg)  BMI 26.01 kg/m2 , BMI Body mass index is 26.01 kg/(m^2). GEN: Elderly, well nourished, well developed, in no acute distress HEENT: normal Neck: no JVD, carotid bruits, or masses Cardiac: bradycardic RRR; no murmurs, rubs, or gallops,no edema  Respiratory:  clear to auscultation bilaterally, normal work of breathing GI: soft, nontender, nondistended, + BS MS: no deformity or atrophy Skin: warm and dry  Neuro:  Strength and sensation are intact Psych: euthymic mood, full affect  EKG:  EKG is not ordered today  Recent Labs: 05/12/2015: TSH 0.16* 05/25/2015: B Natriuretic Peptide 252.2* 05/26/2015: ALT 15* 05/28/2015: BUN 15; Creatinine, Ser 0.95; Hemoglobin 12.5*; Magnesium 1.7; Platelets 218; Potassium 3.6; Sodium 140    Lipid Panel     Component Value Date/Time   CHOL 170 09/12/2014 1517   TRIG 117.0 09/12/2014 1517   HDL 41.10 09/12/2014 1517   CHOLHDL 4 09/12/2014 1517   VLDL 23.4 09/12/2014 1517   LDLCALC 105* 09/12/2014 1517   LDLDIRECT 118.9 08/23/2009 1050     Wt Readings from Last 3 Encounters:  06/28/15 146 lb 12.8 oz (66.588 kg)  06/16/15 143 lb 6.4 oz (65.046 kg)  05/28/15 136 lb 1.6 oz (61.735 kg)      Other studies Reviewed: Additional studies/ records that were reviewed today include: Dr Lubertha Basque office notes   ASSESSMENT AND PLAN:  1.  Persistent atrial fibrillation I have seen [name] is a 70 y.o. male in the office today who has been  referred by Dr [name] for a Watchman left atrial appendage closure device.  He has a history of persistent atrial fibrillation.  This patients CHA2DS2-VASc Score and unadjusted Ischemic Stroke Rate (% per year) is equal to 3.2 % stroke rate/year from a score of 3 which necessitates long term oral anticoagulation to prevent stroke. HasBled score is 3.  Modified Rankin Score is 0. Unfortunately, He is not felt to be a long term Warfarin candidate secondary to prior GI bleeding.  He is currently tolerating Eliquis and would like to hold off on Watchman at this time.  I did offer screening TEE today, but the patient would like to defer for now. He is aware to let us know if he would like to proceed in the future.  2.  Sinus bradycardia with Mobitz I heart block The patient is now V pacing >30% of the time He appears to be asymptomatic but is very stoic I have asked that he discuss upgrade to dual chamber ICD at next office visit with Dr [name] With recent VT requiring amiodarone, I think that he would benefit from atrial lead and restoration of AV synchrony.   3.  VT No ICD shocks Follow up with Dr [name] as scheduled Continue amiodarone 200mg  twice daily for now     Follow-up:  With Dr [name] as scheduled   Current medicines are reviewed at length with the patient today.   The patient does not have concerns regarding his medicines.  The following changes were made today:  none  Labs/ tests ordered today include: none  No orders of the defined types were placed in this encounter.     Randolm Idol, MD  06/28/2015 2:46 PM     [facility] [address] [place] [address] [phone] (office) [phone] (fax)

## 2015-07-07 ENCOUNTER — Telehealth: Payer: Self-pay | Admitting: Internal Medicine

## 2015-07-07 NOTE — Telephone Encounter (Signed)
New message  Pt c/o BP issue: STAT if pt c/o blurred vision, one-sided weakness or slurred speech  1. What are your last 5 BP readings? 145/82  2. Are you having any other symptoms (ex. Dizziness, headache, blurred vision, passed out)? no  3. What is your BP issue? Top number is getting high

## 2015-07-07 NOTE — Telephone Encounter (Signed)
I wouldn't change the BP meds at this point.  I don't know if the equilibrium is BP/med related.  Likely safer to leave it as is for now and recheck him next week.  That would be preferred.  Thanks.

## 2015-07-07 NOTE — Telephone Encounter (Addendum)
Patient states he is not light-headed but feels that his equilibrium is messed up.  This 145/82 reading is an average, not a one time reading.  Patient is not sure of the dosage of Losartan as he was not at home at that time.  Patient will call us back with the dosage.  Patient states he is taking Losartan 50 mg tablet and takes 1/2 tablet (25 mg total) twice daily.

## 2015-07-07 NOTE — Telephone Encounter (Signed)
Ask pt: is he lightheaded?  If the 145/82 an average or a one time measurement?  How much losartan is he currently taking?   Let me know.  Thanks.

## 2015-07-07 NOTE — Telephone Encounter (Signed)
Dr Para March made adjustments in patient's Losartan and Dr [name] would prefer he follow up with them for BP issues.  Patient aware I will forward to PCP for review

## 2015-07-10 ENCOUNTER — Other Ambulatory Visit: Payer: Self-pay | Admitting: *Deleted

## 2015-07-10 ENCOUNTER — Other Ambulatory Visit: Payer: Medicare Other

## 2015-07-10 MED ORDER — LOSARTAN POTASSIUM 25 MG PO TABS: 25.0000 mg | ORAL_TABLET | Freq: Two times a day (BID) | ORAL | Status: DC

## 2015-07-10 NOTE — Telephone Encounter (Signed)
Patient left a voicemail stating that he has been trying to get a new script sent to the pharmacy for Losartan 25 mg taking it twice a day. Patient stated that he was taking Losartan 50 mg twice a day and it was cut back.  Please advise.

## 2015-07-10 NOTE — Telephone Encounter (Signed)
New Rx sent for Losartan 25 mg twice daily.  Same dose that patient is currently taking but this way patient does not have to cut the tablet in half.

## 2015-07-10 NOTE — Telephone Encounter (Signed)
Left detailed message on voicemail.  

## 2015-07-11 ENCOUNTER — Other Ambulatory Visit (INDEPENDENT_AMBULATORY_CARE_PROVIDER_SITE_OTHER): Payer: Medicare Other

## 2015-07-11 DIAGNOSIS — I10 Essential (primary) hypertension: Secondary | ICD-10-CM | POA: Diagnosis not present

## 2015-07-11 LAB — BASIC METABOLIC PANEL
BUN: 26 mg/dL — AB (ref 7–25)
CHLORIDE: 104 mmol/L (ref 98–110)
CO2: 29 mmol/L (ref 20–31)
Calcium: 9 mg/dL (ref 8.6–10.3)
Creat: 1.26 mg/dL — ABNORMAL HIGH (ref 0.70–1.18)
GLUCOSE: 71 mg/dL (ref 65–99)
POTASSIUM: 5.2 mmol/L (ref 3.5–5.3)
Sodium: 142 mmol/L (ref 135–146)

## 2015-07-13 ENCOUNTER — Ambulatory Visit (INDEPENDENT_AMBULATORY_CARE_PROVIDER_SITE_OTHER): Payer: Medicare Other | Admitting: Family Medicine

## 2015-07-13 ENCOUNTER — Encounter: Payer: Self-pay | Admitting: Family Medicine

## 2015-07-13 VITALS — BP 112/60 | HR 52 | Temp 97.5°F | Wt 150.2 lb

## 2015-07-13 DIAGNOSIS — I4891 Unspecified atrial fibrillation: Secondary | ICD-10-CM

## 2015-07-13 DIAGNOSIS — I2510 Atherosclerotic heart disease of native coronary artery without angina pectoris: Secondary | ICD-10-CM

## 2015-07-13 NOTE — Progress Notes (Signed)
Pre visit review using our clinic review tool, if applicable. No additional management support is needed unless otherwise documented below in the visit note.  No SOB.  No heart racing now but has had rarely runs of palpitations that last a few seconds and then self resolve.  No cough.  No fevers.  On amiodarone, BB and digoxin.  Not typically lightheaded on standing but he noted a brief change on standing at the OV- it self resolved immediately.  Still with some occ balance changes noted, but not room spinning.  Zero chest pain.    Meds, vitals, and allergies reviewed.   ROS: Per HPI unless specifically indicated in ROS section   GEN: nad, alert and oriented HEENT: mucous membranes moist NECK: supple w/o LA CV: rrr but brady into the 40s noted PULM: ctab, no inc wob ABD: soft, +bs EXT: no edema SKIN: no acute rash Was briefly lightheaded on standing at the OV, self resolved quickly.

## 2015-07-13 NOTE — Patient Instructions (Signed)
Don't take digoxin tomorrow until we can call you.  If you can keep it to 3 cigarettes or less for now, then that is way better than previous and that's pretty good for now.  We'll be in touch.  Take care.  Glad to see you.

## 2015-07-14 ENCOUNTER — Telehealth: Payer: Self-pay | Admitting: Family Medicine

## 2015-07-14 NOTE — Telephone Encounter (Signed)
Call pt.  I called and talked to Dr. Tenny Craw after the OV, as the doc of the day.   In his situation, would stop digoxin totally, recheck BMET Monday, and I'll route his notes over to cardiology.  bmer ordered.  I realize he had recent bmet but we need to recheck K.  I am uncertain how much the off balance/lightheadedness if related to bradycardia and stopping the digoxin may help.  He may need a lower beta blocker dose in the future.   Thanks.

## 2015-07-14 NOTE — Telephone Encounter (Signed)
Patient advised.  Lab appt scheduled.  

## 2015-07-14 NOTE — Assessment & Plan Note (Signed)
With brady noted.  I called and talked to Dr. Tenny Craw after the OV, as the doc of the day.   In his situation, would stop digoxin, recheck BMET Monday, and I'll route this over to cardiology.   I am uncertain how much the off balance/lightheadedness if related to bradycardia and stopping the digoxin may help.  He may need a lower BB dose.   Still okay for outpatient f/u.  >25 minutes spent in face to face time with patient, >50% spent in counselling or coordination of care.

## 2015-07-17 ENCOUNTER — Other Ambulatory Visit: Payer: Self-pay | Admitting: Family Medicine

## 2015-07-18 ENCOUNTER — Other Ambulatory Visit (INDEPENDENT_AMBULATORY_CARE_PROVIDER_SITE_OTHER): Payer: Medicare Other

## 2015-07-18 DIAGNOSIS — I4891 Unspecified atrial fibrillation: Secondary | ICD-10-CM | POA: Diagnosis not present

## 2015-07-18 LAB — BASIC METABOLIC PANEL
BUN: 26 mg/dL — AB (ref 6–23)
CHLORIDE: 104 meq/L (ref 96–112)
CO2: 33 meq/L — AB (ref 19–32)
CREATININE: 1.25 mg/dL (ref 0.40–1.50)
Calcium: 9.2 mg/dL (ref 8.4–10.5)
GFR: 60.49 mL/min (ref >60.00)
Glucose, Bld: 105 mg/dL — ABNORMAL HIGH (ref 70–99)
Potassium: 4.5 mEq/L (ref 3.5–5.1)
Sodium: 142 mEq/L (ref 135–145)

## 2015-07-18 NOTE — Telephone Encounter (Signed)
Received refill request electronically Last refill 01/17/15 #30/5 Last office visit 07/13/15

## 2015-07-19 NOTE — Telephone Encounter (Signed)
Please call in.  Thanks.   

## 2015-07-19 NOTE — Telephone Encounter (Signed)
Rx called to pharmacy as instructed. 

## 2015-07-20 ENCOUNTER — Telehealth: Payer: Self-pay | Admitting: Internal Medicine

## 2015-07-20 NOTE — Telephone Encounter (Signed)
Left message for pt that Dr. Lubertha Basque nurse will have to call him back if there is an updated time he can get him in sooner In review pt last EKG HR was 41 v-paced and device not bradycardia setting on 06/2015

## 2015-07-20 NOTE — Telephone Encounter (Signed)
New Message:  Pt called in wanting to get a sooner appt with Dr. [name] due to his PCP Dr. Para March advising him . Dr. Para March informed him that his pulse rate is too low, running him 40-42. I told the pt that Dr. Lubertha Basque next available was in August and that he currently had a standing appt of 7/13. I asked the patient how long his pulse rate had been running low and he said since mid April. Please f/u with the patient.

## 2015-07-21 NOTE — Telephone Encounter (Addendum)
[facility] requesting call back from patient, gave device clinic phone number.  Per Tresa Endo, offer patient 12:30pm appointment on 07/25/15.  Patient scheduled, will confirm that he can take it when he returns call.

## 2015-07-21 NOTE — Telephone Encounter (Signed)
Able to reach patient.  He is agreeable to appointment on 07/25/15 at 12:30.  Patient is appreciative and denies additional questions or concerns at this time.  He is aware to call with worsening symptoms, questions, or concerns.

## 2015-07-23 ENCOUNTER — Other Ambulatory Visit: Payer: Self-pay | Admitting: Family Medicine

## 2015-07-25 ENCOUNTER — Encounter: Payer: Self-pay | Admitting: Internal Medicine

## 2015-07-25 ENCOUNTER — Ambulatory Visit (INDEPENDENT_AMBULATORY_CARE_PROVIDER_SITE_OTHER): Payer: Medicare Other | Admitting: Internal Medicine

## 2015-07-25 VITALS — BP 102/80 | HR 48 | Ht 63.0 in | Wt 149.8 lb

## 2015-07-25 DIAGNOSIS — I2510 Atherosclerotic heart disease of native coronary artery without angina pectoris: Secondary | ICD-10-CM

## 2015-07-25 DIAGNOSIS — I472 Ventricular tachycardia, unspecified: Secondary | ICD-10-CM

## 2015-07-25 LAB — CUP PACEART INCLINIC DEVICE CHECK
Battery Remaining Longevity: 97 mo
Battery Voltage: 3.01 V
Brady Statistic RV Percent Paced: 23.04 %
HIGH POWER IMPEDANCE MEASURED VALUE: 49 Ohm
HIGH POWER IMPEDANCE MEASURED VALUE: 59 Ohm
Lead Channel Impedance Value: 342 Ohm
Lead Channel Impedance Value: 399 Ohm
Lead Channel Sensing Intrinsic Amplitude: 4.25 mV
Lead Channel Sensing Intrinsic Amplitude: 4.625 mV
Lead Channel Setting Pacing Amplitude: 2.5 V
Lead Channel Setting Pacing Pulse Width: 0.4 ms
MDC IDC LEAD IMPLANT DT: 20040722
MDC IDC LEAD LOCATION: 753860
MDC IDC MSMT LEADCHNL RV PACING THRESHOLD AMPLITUDE: 0.75 V
MDC IDC MSMT LEADCHNL RV PACING THRESHOLD PULSEWIDTH: 0.4 ms
MDC IDC SESS DTM: 20170620132419
MDC IDC SET LEADCHNL RV SENSING SENSITIVITY: 0.3 mV

## 2015-07-25 MED ORDER — AMIODARONE HCL 200 MG PO TABS: ORAL_TABLET | ORAL | Status: DC

## 2015-07-25 NOTE — Progress Notes (Signed)
HPI [name] returns today for followup. He is a 70 year old man with an ischemic cardiomyopathy, chronic systolic heart failure, class III, chronic atrial fibrillation, previously unwilling to take any anticoagulation, ventricular tachycardia, status post ICD implantation. He was in the hospital several months ago with VT storm. He was placed on Eliquis as he converted back to NSR after being out of rhythm for over a year. The patient was placed on amiodarone to prevent VT. He has done well. He is interested in the [place] procedure. He denies sob or chest pain.  Since I saw him last, he feels a little better.   Allergies  Allergen Reactions  . Ace Inhibitors Other (See Comments)    muscle pain. Tolerates ARBs.   . Codeine Other (See Comments)    "head wants to explode."  . Penicillins Swelling    "started at point of injection; w/in 3 min my upper arm was swollen 3 times normal"  . Lisinopril     Muscle Pain  . Statins Other (See Comments)    Myalgias per patient     Current Outpatient Prescriptions  Medication Sig Dispense Refill  . amiodarone (PACERONE) 200 MG tablet Take 1 tablet (200 mg total) by mouth 2 (two) times daily. 180 tablet 3  . apixaban (ELIQUIS) 5 MG TABS tablet Take 1 tablet (5 mg total) by mouth 2 (two) times daily. 60 tablet 6  . aspirin 81 MG EC tablet Take 1 tablet (81 mg total) by mouth daily.    . benzonatate (TESSALON) 200 MG capsule TAKE 1 CAPSULE BY MOUTH 3 TIMES DAILY AS NEEDED FOR COUGH 30 capsule 1  . carvedilol (COREG) 6.25 MG tablet Take 1/2 tablet (3.125 mg) by mouth twice daily with a meal    . diazepam (VALIUM) 5 MG tablet Take 5 mg by mouth every 6 (six) hours as needed for anxiety.    . fexofenadine (ALLEGRA) 180 MG tablet Take 180 mg by mouth daily as needed for allergies or rhinitis.    . furosemide (LASIX) 20 MG tablet Take 1 tablet by mouth daily as needed for fluid/ swelling    . HYDROcodone-acetaminophen (NORCO/VICODIN) 5-325 MG tablet TAKE 1  TABLET BY MOUTH EVERY 6 HOURS AS NEEDED FOR PAIN 60 tablet 0  . losartan (COZAAR) 50 MG tablet Take 25 mg by mouth 2 (two) times daily.    . metaxalone (SKELAXIN) 800 MG tablet TAKE 1 TABLET BY MOUTH 3 TIMES A DAY AS NEEDED FOR PAIN. 90 tablet 1  . Multiple Vitamins-Minerals (MULTIVITAMIN ADULTS 50+) TABS Take 1 tablet by mouth daily.    [name] NITROSTAT 0.4 MG SL tablet PLACE 1 TABLET (0.4 MG TOTAL) UNDER THE TONGUE EVERY 5 (FIVE) MINUTES AS NEEDED. FOR CHEST PAIN. 25 tablet 1  . promethazine (PHENERGAN) 25 MG tablet TAKE 1 TABLET BY MOUTH EVERY 6 HOURS AS NEEDED FOR NAUSEA/VOMITING 30 tablet 1  . ranitidine (ZANTAC) 150 MG capsule Take 150 mg by mouth every evening.    . traMADol (ULTRAM) 50 MG tablet Take 50-100 mg by mouth every 8 (eight) hours as needed (pain).    [name] zolpidem (AMBIEN CR) 12.5 MG CR tablet TAKE 1 TABLET BY MOUTH AT BEDTIME 30 tablet 5   No current facility-administered medications for this visit.   Facility-Administered Medications Ordered in Other Visits  Medication Dose Route Frequency Provider Last Rate Last Dose  . 0.45 % sodium chloride infusion   Intravenous Continuous Marinus Maw, MD      . 0.9 %  sodium chloride infusion  250 mL Intravenous Continuous Marinus Maw, MD      . chlorhexidine (HIBICLENS) 4 % liquid 4 application  60 mL Topical Once Marinus Maw, MD      . sodium chloride 0.9 % injection 3 mL  3 mL Intravenous Q12H Marinus Maw, MD      . sodium chloride 0.9 % injection 3 mL  3 mL Intravenous PRN Marinus Maw, MD         Past Medical History  Diagnosis Date  . ICD (implantable cardiac defibrillator) in place   . CAD (coronary artery disease), autologous vein bypass graft   . Allergic rhinitis, cause unspecified   . HLD (hyperlipidemia)   . Diverticulosis     by CT scan  . Paroxysmal ventricular tachycardia (HCC)   . Atrial fibrillation (HCC)     a. 05/2015 - converted to sinus in setting of ICD shocks; placed on eliquis 5 bid.  . Acute  myocardial infarction, unspecified site, episode of care unspecified 1995    Pt living in [place], no stent, ?PTCA  . Acute lower GI bleeding 12/11/2011    "first time" (12/11/2011)  . Chronic systolic CHF (congestive heart failure), NYHA class 2 (HCC)     Reports EF of 25%.   . Arthritis     knees, back  . Insomnia   . Anxiety   . Cervical herniated disc     told not to lift >10 lbs  . COPD (chronic obstructive pulmonary disease) (HCC) 11/2012    by xray  . Perennial allergic rhinitis     only to dust mites  . VT (ventricular tachycardia) (HCC)     a. 05/2015 - VT storm with multiple ICD shocks-->Amio 400 BID.    ROS:   All systems reviewed and negative except as noted in the HPI.   Past Surgical History  Procedure Laterality Date  . Cardiac defibrillator placement  2004  . Tonsillectomy and adenoidectomy  ~ 1951  . Cholecystectomy  1/ 2012  . Shoulder arthroscopy w/ rotator cuff repair  twice    right (12/11/2011)  . Coronary angioplasty  1995    Pt thinks he got a balloon, living in [place], [place]  . Partial knee arthroplasty  ~ 2000    left  . Insert / replace / remove pacemaker  2004    Medtronic ICD  . Colonoscopy  01/08/2012    Procedure: COLONOSCOPY;  Surgeon: Iva Boop, MD;  Location: [facility];  Service: Endoscopy;  Laterality: N/A;  . Cataract extraction Right 01/2012  . Implantable cardioverter defibrillator generator change N/A 02/07/2012    Procedure: IMPLANTABLE CARDIOVERTER DEFIBRILLATOR GENERATOR CHANGE;  Surgeon: Marinus Maw, MD; Medtronic Evera XT VR single-chamber serial number [id number], Laterality: Left  . Cardiac catheterization  2004    LAD 30%, D1 30%, CFX-AV groove 70-80%, OM1 30%, EF 20-25%     Family History  Problem Relation Age of Onset  . Diabetes Father   . Tracheal cancer Father 14    smoker  . Stroke Mother   . Cancer Sister     left eye  . CAD Neg Hx   . Colon cancer Neg Hx   . Prostate cancer Neg Hx      Social  History   Social History  . Marital Status: Married    Spouse Name: N/A  . Number of Children: 0  . Years of Education: N/A   Occupational History  .  [employer] truck driver (retired)    Social History Main Topics  . Smoking status: Current Every Day Smoker -- 0.25 packs/day for 50 years    Types: Cigarettes, Cigars  . Smokeless tobacco: Never Used     Comment: ~ 8 cigarettes daily  . Alcohol Use: No  . Drug Use: No  . Sexual Activity: No   Other Topics Concern  . Not on file   Social History Narrative   Lives with wife, married 1998   Grown children, 2 great grandchildren   Occupation: retired, was Presenter, broadcasting   Activity: walking, fishing   Diet: good water daily, fruits/vegetables rare      Wife is Product manager.    [id number], Human resources officer. No known agent orange exposure.       BP 102/80 mmHg  Pulse 48  Ht  (1.6 m)  Wt 149 lb 12.8 oz (67.949 kg)  BMI 26.54 kg/m2  Physical Exam:  Chronically ill appearing 70 yo man, NAD HEENT: Unremarkable Neck:  6 cm JVD, no thyromegally Lungs:  Clear except for rales in the bases. No wheezes or rhonchi. Well-healed ICD incision. HEART:  Regular brady rhythm, no murmurs, no rubs, no clicks Abd:  soft, positive bowel sounds, no organomegally, no rebound, no guarding Ext:  2 plus pulses, no edema, no cyanosis, no clubbing Skin:  No rashes no nodules Neuro:  CN II through XII intact, motor grossly intact  ECG - Probable Sinus bradycardia with ventricular pacing   DEVICE  Normal device function.  See PaceArt for details. Optivol is stable.  Assess/Plan:  1. VT - he has not had more VT since high dose amiodarone. He will reduce his dose down to 400 mg daily on Saturday-Sunday and 200 mg daily Monday thru Friday 2. Chronic systolic heart failure - his symptoms are controlled. No change in meds. 3. HTN - his blood pressure is well controlled. Will follow. 4. ICD - his Medtronic device is working normally. Will recheck in several  months.  5. Atrial fib - he is at risk for stroke. I have referred him for Watchman as he is not a good long term anti-coagulation candidate. He unfortunately refused.  Leonia Reeves.D.

## 2015-07-25 NOTE — Patient Instructions (Addendum)
Medication Instructions:  Your physician has recommended you make the following change in your medication:  1) Decrease Amiodarone to 200 mg(1 tablet) by mouth Mon-Fri and 400mg (2 tablets) by mouth Sat and Sun    Labwork: None ordered   Testing/Procedures: None ordered   Follow-Up: Your physician recommends that you schedule a follow-up appointment in: 3 months with Dr [name]   Any Other Special Instructions Will Be Listed Below (If Applicable).     If you need a refill on your cardiac medications before your next appointment, please call your pharmacy.

## 2015-08-09 ENCOUNTER — Other Ambulatory Visit: Payer: Self-pay | Admitting: Family Medicine

## 2015-08-09 NOTE — Telephone Encounter (Signed)
Received refill request electronically Last refill 06/11/15 #45 Last office visit 07/13/15

## 2015-08-09 NOTE — Telephone Encounter (Signed)
Please call in.  Thanks.   

## 2015-08-10 NOTE — Telephone Encounter (Signed)
Medication phoned to pharmacy.  

## 2015-08-17 ENCOUNTER — Ambulatory Visit: Payer: Medicare Other | Admitting: Internal Medicine

## 2015-08-28 ENCOUNTER — Encounter: Payer: Self-pay | Admitting: Internal Medicine

## 2015-08-29 ENCOUNTER — Encounter: Payer: Self-pay | Admitting: Internal Medicine

## 2015-08-29 ENCOUNTER — Ambulatory Visit (INDEPENDENT_AMBULATORY_CARE_PROVIDER_SITE_OTHER): Payer: Medicare Other | Admitting: Internal Medicine

## 2015-08-29 VITALS — BP 130/76 | HR 54 | Ht 63.0 in | Wt 151.8 lb

## 2015-08-29 DIAGNOSIS — I472 Ventricular tachycardia, unspecified: Secondary | ICD-10-CM

## 2015-08-29 DIAGNOSIS — I4819 Other persistent atrial fibrillation: Secondary | ICD-10-CM

## 2015-08-29 DIAGNOSIS — I255 Ischemic cardiomyopathy: Secondary | ICD-10-CM

## 2015-08-29 DIAGNOSIS — I481 Persistent atrial fibrillation: Secondary | ICD-10-CM

## 2015-08-29 LAB — CUP PACEART INCLINIC DEVICE CHECK
Battery Remaining Longevity: 102 mo
Battery Voltage: 2.98 V
HIGH POWER IMPEDANCE MEASURED VALUE: 47 Ohm
HIGH POWER IMPEDANCE MEASURED VALUE: 55 Ohm
Lead Channel Impedance Value: 399 Ohm
Lead Channel Sensing Intrinsic Amplitude: 4.125 mV
Lead Channel Sensing Intrinsic Amplitude: 4.125 mV
Lead Channel Setting Pacing Amplitude: 2.5 V
MDC IDC LEAD IMPLANT DT: 20040722
MDC IDC LEAD LOCATION: 753860
MDC IDC MSMT LEADCHNL RV IMPEDANCE VALUE: 342 Ohm
MDC IDC MSMT LEADCHNL RV PACING THRESHOLD AMPLITUDE: 0.875 V
MDC IDC MSMT LEADCHNL RV PACING THRESHOLD PULSEWIDTH: 0.4 ms
MDC IDC SESS DTM: 20170725161558
MDC IDC SET LEADCHNL RV PACING PULSEWIDTH: 0.4 ms
MDC IDC SET LEADCHNL RV SENSING SENSITIVITY: 0.3 mV
MDC IDC STAT BRADY RV PERCENT PACED: 2.78 %

## 2015-08-29 NOTE — Patient Instructions (Signed)
Medication Instructions:  Your physician recommends that you continue on your current medications as directed. Please refer to the Current Medication list given to you today.  Labwork: None ordered  Testing/Procedures: None ordered  Follow-Up: Remote monitoring is used to monitor your Pacemaker of ICD from home. This monitoring reduces the number of office visits required to check your device to one time per year. It allows [place] to keep an eye on the functioning of your device to ensure it is working properly. You are scheduled for a device check from home on 10/30/15. You may send your transmission at any time that day. If you have a wireless device, the transmission will be sent automatically. After your physician reviews your transmission, you will receive a postcard with your next transmission date.  Your physician wants you to follow-up in: 6 months with Dr. [name]. You will receive a reminder letter in the mail two months in advance. If you don't receive a letter, please call our office to schedule the follow-up appointment.   Any Other Special Instructions Will Be Listed Below (If Applicable).     If you need a refill on your cardiac medications before your next appointment, please call your pharmacy.

## 2015-08-29 NOTE — Progress Notes (Signed)
HPI [name] returns today for followup. He is a 70 year old man with an ischemic cardiomyopathy, chronic systolic heart failure, class III, chronic atrial fibrillation, previously unwilling to take any anticoagulation, ventricular tachycardia, status post ICD implantation. He was in the hospital several months ago with VT storm. He was placed on Eliquis as he converted back to NSR after being out of rhythm for over a year. The patient was placed on amiodarone to prevent VT. He has done well. He is not interested in the [place] procedure. He denies sob or chest pain.  Since I saw him last, he feels a little better. His sinus rate and AV conduction have improved.   Allergies  Allergen Reactions  . Ace Inhibitors Other (See Comments)    muscle pain. Tolerates ARBs.   . Codeine Other (See Comments)    "head wants to explode."  . Penicillins Swelling    "started at point of injection; w/in 3 min my upper arm was swollen 3 times normal"  . Lisinopril     Muscle Pain  . Statins Other (See Comments)    Myalgias per patient     Current Outpatient Prescriptions  Medication Sig Dispense Refill  . amiodarone (PACERONE) 200 MG tablet Take 200 mg by mouth Mon-Fri and 400 mg on Sat and Sun 180 tablet 3  . apixaban (ELIQUIS) 5 MG TABS tablet Take 1 tablet (5 mg total) by mouth 2 (two) times daily. 60 tablet 6  . aspirin 81 MG EC tablet Take 1 tablet (81 mg total) by mouth daily.    . benzonatate (TESSALON) 200 MG capsule TAKE 1 CAPSULE BY MOUTH 3 TIMES DAILY AS NEEDED FOR COUGH 30 capsule 1  . carvedilol (COREG) 6.25 MG tablet Take 1/2 tablet (3.125 mg) by mouth twice daily with a meal    . diazepam (VALIUM) 5 MG tablet TAKE 1 TABLET BY MOUTH EVERY 6 HOURS AS NEEDED 45 tablet 0  . fexofenadine (ALLEGRA) 180 MG tablet Take 180 mg by mouth daily as needed for allergies or rhinitis.    . furosemide (LASIX) 20 MG tablet Take 1 tablet by mouth daily as needed for fluid/ swelling    .  HYDROcodone-acetaminophen (NORCO/VICODIN) 5-325 MG tablet TAKE 1 TABLET BY MOUTH EVERY 6 HOURS AS NEEDED FOR PAIN 60 tablet 0  . losartan (COZAAR) 50 MG tablet Take 25 mg by mouth 2 (two) times daily.    . metaxalone (SKELAXIN) 800 MG tablet TAKE 1 TABLET BY MOUTH 3 TIMES A DAY AS NEEDED FOR PAIN. 90 tablet 1  . Multiple Vitamins-Minerals (MULTIVITAMIN ADULTS 50+) TABS Take 1 tablet by mouth daily.    [name] NITROSTAT 0.4 MG SL tablet PLACE 1 TABLET (0.4 MG TOTAL) UNDER THE TONGUE EVERY 5 (FIVE) MINUTES AS NEEDED. FOR CHEST PAIN. 25 tablet 1  . promethazine (PHENERGAN) 25 MG tablet TAKE 1 TABLET BY MOUTH EVERY 6 HOURS AS NEEDED FOR NAUSEA/VOMITING 30 tablet 1  . ranitidine (ZANTAC) 150 MG capsule Take 150 mg by mouth every evening.    . traMADol (ULTRAM) 50 MG tablet Take 50-100 mg by mouth every 8 (eight) hours as needed (pain).    [name] zolpidem (AMBIEN CR) 12.5 MG CR tablet TAKE 1 TABLET BY MOUTH AT BEDTIME 30 tablet 5   No current facility-administered medications for this visit.    Facility-Administered Medications Ordered in Other Visits  Medication Dose Route Frequency Provider Last Rate Last Dose  . 0.45 % sodium chloride infusion   Intravenous Continuous Marinus Maw,  MD      . 0.9 %  sodium chloride infusion  250 mL Intravenous Continuous Marinus Maw, MD      . chlorhexidine (HIBICLENS) 4 % liquid 4 application  60 mL Topical Once Marinus Maw, MD      . sodium chloride 0.9 % injection 3 mL  3 mL Intravenous Q12H Marinus Maw, MD      . sodium chloride 0.9 % injection 3 mL  3 mL Intravenous PRN Marinus Maw, MD         Past Medical History:  Diagnosis Date  . Acute lower GI bleeding 12/11/2011   "first time" (12/11/2011)  . Acute myocardial infarction, unspecified site, episode of care unspecified 1995   Pt living in [place], no stent, ?PTCA  . Allergic rhinitis, cause unspecified   . Anxiety   . Arthritis    knees, back  . Atrial fibrillation (HCC)    a. 05/2015 - converted  to sinus in setting of ICD shocks; placed on eliquis 5 bid.  [name] CAD (coronary artery disease), autologous vein bypass graft   . Cervical herniated disc    told not to lift >10 lbs  . Chronic systolic CHF (congestive heart failure), NYHA class 2 (HCC)    Reports EF of 25%.   [name] COPD (chronic obstructive pulmonary disease) (HCC) 11/2012   by xray  . Diverticulosis    by CT scan  . HLD (hyperlipidemia)   . ICD (implantable cardiac defibrillator) in place   . Insomnia   . Paroxysmal ventricular tachycardia (HCC)   . Perennial allergic rhinitis    only to dust mites  . VT (ventricular tachycardia) (HCC)    a. 05/2015 - VT storm with multiple ICD shocks-->Amio 400 BID.    ROS:   All systems reviewed and negative except as noted in the HPI.   Past Surgical History:  Procedure Laterality Date  . CARDIAC CATHETERIZATION  2004   LAD 30%, D1 30%, CFX-AV groove 70-80%, OM1 30%, EF 20-25%  . CARDIAC DEFIBRILLATOR PLACEMENT  2004  . CATARACT EXTRACTION Right 01/2012  . CHOLECYSTECTOMY  1/ 2012  . COLONOSCOPY  01/08/2012   Procedure: COLONOSCOPY;  Surgeon: Iva Boop, MD;  Location: [facility];  Service: Endoscopy;  Laterality: N/A;  . CORONARY ANGIOPLASTY  1995   Pt thinks he got a balloon, living in [place], [place]  . IMPLANTABLE CARDIOVERTER DEFIBRILLATOR GENERATOR CHANGE N/A 02/07/2012   Procedure: IMPLANTABLE CARDIOVERTER DEFIBRILLATOR GENERATOR CHANGE;  Surgeon: Marinus Maw, MD; Medtronic Evera XT VR single-chamber serial number [id number], Laterality: Left  . INSERT / REPLACE / REMOVE PACEMAKER  2004   Medtronic ICD  . PARTIAL KNEE ARTHROPLASTY  ~ 2000   left  . SHOULDER ARTHROSCOPY W/ ROTATOR CUFF REPAIR  twice   right (12/11/2011)  . TONSILLECTOMY AND ADENOIDECTOMY  ~ 1951     Family History  Problem Relation Age of Onset  . Diabetes Father   . Tracheal cancer Father 20    smoker  . Stroke Mother   . Cancer Sister     left eye  . CAD Neg Hx   . Colon cancer Neg Hx    . Prostate cancer Neg Hx      Social History   Social History  . Marital status: Married    Spouse name: N/A  . Number of children: 0  . Years of education: N/A   Occupational History  . [employer] truck driver (retired)  Social History Main Topics  . Smoking status: Current Every Day Smoker    Packs/day: 0.25    Years: 50.00    Types: Cigarettes, Cigars  . Smokeless tobacco: Never Used     Comment: ~ 8 cigarettes daily  . Alcohol use No  . Drug use: No  . Sexual activity: No   Other Topics Concern  . Not on file   Social History Narrative   Lives with wife, married 1998   Grown children, 2 great grandchildren   Occupation: retired, was Presenter, broadcasting   Activity: walking, fishing   Diet: good water daily, fruits/vegetables rare      Wife is Product manager.    [id number], Human resources officer. No known agent orange exposure.       BP 130/76   Pulse (!) 54   Ht  (1.6 m)   Wt 151 lb 12.8 oz (68.9 kg)   BMI 26.89 kg/m   Physical Exam:  Chronically ill appearing 70 yo man, NAD HEENT: Unremarkable Neck:  6 cm JVD, no thyromegally Lungs:  Clear except for rales in the bases. No wheezes or rhonchi. Well-healed ICD incision. HEART:  Regular brady rhythm, no murmurs, no rubs, no clicks Abd:  soft, positive bowel sounds, no organomegally, no rebound, no guarding Ext:  2 plus pulses, no edema, no cyanosis, no clubbing Skin:  No rashes no nodules Neuro:  CN II through XII intact, motor grossly intact   DEVICE  Normal device function.  See PaceArt for details. Optivol is stable.  Assess/Plan:  1. VT - he has not had more VT since high dose amiodarone. He will continue his dose down to 400 mg daily on Saturday-Sunday and 200 mg daily Monday thru Friday 2. Chronic systolic heart failure - his symptoms are controlled. No change in meds. He is improved. 3. ICD - his Medtronic device is working normally. Will recheck in several months.  4. Atrial fib - he is at risk for stroke. I had  referred him for Watchman as he is not a good long term anti-coagulation candidate. He unfortunately refused.  Leonia Reeves.D.

## 2015-08-31 ENCOUNTER — Telehealth: Payer: Self-pay | Admitting: Family Medicine

## 2015-08-31 NOTE — Telephone Encounter (Signed)
LM for pt to sch CPE and AWV, mn °

## 2015-09-01 ENCOUNTER — Ambulatory Visit (INDEPENDENT_AMBULATORY_CARE_PROVIDER_SITE_OTHER): Payer: Medicare Other | Admitting: Family Medicine

## 2015-09-01 ENCOUNTER — Encounter: Payer: Self-pay | Admitting: Family Medicine

## 2015-09-01 DIAGNOSIS — J0100 Acute maxillary sinusitis, unspecified: Secondary | ICD-10-CM

## 2015-09-01 DIAGNOSIS — I255 Ischemic cardiomyopathy: Secondary | ICD-10-CM

## 2015-09-01 MED ORDER — DOXYCYCLINE HYCLATE 100 MG PO TABS: 100.0000 mg | ORAL_TABLET | Freq: Two times a day (BID) | ORAL | 0 refills | Status: DC

## 2015-09-01 NOTE — Patient Instructions (Signed)
Start doxycycline and update me as needed.   Take care.  Glad to see you.  This should get better.

## 2015-09-01 NOTE — Progress Notes (Signed)
Pre visit review using our clinic review tool, if applicable. No additional management support is needed unless otherwise documented below in the visit note.  He globally feels better with med adjustment per cards, and I appreciate cards input.  D/w pt.    duration of symptoms: about 7 days.   Rhinorrhea: yes congestion:yes ear pain: yes sore throat: not sore but irritated from cough.  Cough:yes, some sputum, not consistently Myalgias: no other concerns: sinus pain.    Prev GI sx better with prn imodium.  No ADE on med.  No sx now.    Per HPI unless specifically indicated in ROS section   Meds, vitals, and allergies reviewed.   GEN: nad, alert and oriented HEENT: mucous membranes moist, TM w/o erythema, nasal epithelium injected, OP with cobblestoning, sinuses ttp x4 NECK: supple w/o LA CV: rrr. PULM: ctab, no inc wob ABD: soft, +bs EXT: no edema

## 2015-09-03 NOTE — Assessment & Plan Note (Signed)
Nontoxic, doxy, fluids, f/u prn.  He agrees. Okay for outpatient f/u.

## 2015-09-04 ENCOUNTER — Ambulatory Visit: Payer: Medicare Other | Admitting: Family Medicine

## 2015-09-22 ENCOUNTER — Ambulatory Visit (INDEPENDENT_AMBULATORY_CARE_PROVIDER_SITE_OTHER): Payer: Medicare Other | Admitting: Family Medicine

## 2015-09-22 ENCOUNTER — Encounter: Payer: Self-pay | Admitting: Family Medicine

## 2015-09-22 VITALS — BP 118/62 | HR 49 | Temp 97.4°F | Wt 150.8 lb

## 2015-09-22 DIAGNOSIS — I255 Ischemic cardiomyopathy: Secondary | ICD-10-CM | POA: Diagnosis not present

## 2015-09-22 DIAGNOSIS — R413 Other amnesia: Secondary | ICD-10-CM | POA: Diagnosis not present

## 2015-09-22 DIAGNOSIS — Z119 Encounter for screening for infectious and parasitic diseases, unspecified: Secondary | ICD-10-CM

## 2015-09-22 LAB — CBC WITH DIFFERENTIAL/PLATELET
BASOS ABS: 79 {cells}/uL (ref 0–200)
Basophils Relative: 1 %
Eosinophils Absolute: 237 cells/uL (ref 15–500)
Eosinophils Relative: 3 %
HEMATOCRIT: 40 % (ref 38.5–50.0)
HEMOGLOBIN: 13.1 g/dL — AB (ref 13.2–17.1)
LYMPHS ABS: 2528 {cells}/uL (ref 850–3900)
LYMPHS PCT: 32 %
MCH: 29.6 pg (ref 27.0–33.0)
MCHC: 32.8 g/dL (ref 32.0–36.0)
MCV: 90.3 fL (ref 80.0–100.0)
MONO ABS: 553 {cells}/uL (ref 200–950)
MPV: 10.5 fL (ref 7.5–12.5)
Monocytes Relative: 7 %
NEUTROS PCT: 57 %
Neutro Abs: 4503 cells/uL (ref 1500–7800)
Platelets: 264 10*3/uL (ref 140–400)
RBC: 4.43 MIL/uL (ref 4.20–5.80)
RDW: 15.3 % — AB (ref 11.0–15.0)
WBC: 7.9 10*3/uL (ref 3.8–10.8)

## 2015-09-22 LAB — COMPREHENSIVE METABOLIC PANEL
ALBUMIN: 3.7 g/dL (ref 3.6–5.1)
ALK PHOS: 70 U/L (ref 40–115)
ALT: 31 U/L (ref 9–46)
AST: 36 U/L — AB (ref 10–35)
BILIRUBIN TOTAL: 0.5 mg/dL (ref 0.2–1.2)
BUN: 21 mg/dL (ref 7–25)
CO2: 29 mmol/L (ref 20–31)
CREATININE: 1.09 mg/dL (ref 0.70–1.18)
Calcium: 8.7 mg/dL (ref 8.6–10.3)
Chloride: 106 mmol/L (ref 98–110)
Glucose, Bld: 80 mg/dL (ref 65–99)
Potassium: 4.9 mmol/L (ref 3.5–5.3)
SODIUM: 143 mmol/L (ref 135–146)
TOTAL PROTEIN: 6.2 g/dL (ref 6.1–8.1)

## 2015-09-22 LAB — TSH: TSH: 0.45 m[IU]/L (ref 0.40–4.50)

## 2015-09-22 NOTE — Patient Instructions (Signed)
Go to the lab on the way out.  We'll contact you with your lab report. We may need to set up a head CT.   We'll be in touch.  Don't change your meds for now.  Take care.  Glad to see you.

## 2015-09-22 NOTE — Progress Notes (Signed)
Still with sinus pain, but some better than prev.  No bloody rhinorrhea, not a "raw" feeling now.   He had some memory complaints.  Here with wife today.   Wife brought a list; in the last few months she has noted: He'll forget to shave, and that is atypical.  He'll overlook paying bill.  He misjudged parking his T bird in the garage.   He forgot appointments.   He couldn't remember his upcoming anniversary date, even with prompting.   He doesn't seem to have as much trouble with distant memories.    Pt opts in for HCV screening.  D/w pt re: routine screening.    Meds, vitals, and allergies reviewed.   ROS: Per HPI unless specifically indicated in ROS section   GEN: nad, alert and oriented except for the day of the week and the date of the month HEENT: mucous membranes moist NECK: supple w/o LA CV: rrr. PULM: ctab, no inc wob ABD: soft, +bs EXT: no edema SKIN: no acute rash CN 2-12 wnl B, S/S/DTR wnl x4  MMSE 26/30, -2 for orientation, -1 attention, -1 recall

## 2015-09-22 NOTE — Progress Notes (Signed)
Pre visit review using our clinic review tool, if applicable. No additional management support is needed unless otherwise documented below in the visit note. 

## 2015-09-23 LAB — HEPATITIS C ANTIBODY: HCV Ab: NEGATIVE

## 2015-09-23 LAB — RPR

## 2015-09-24 ENCOUNTER — Other Ambulatory Visit: Payer: Self-pay | Admitting: Family Medicine

## 2015-09-24 DIAGNOSIS — Z119 Encounter for screening for infectious and parasitic diseases, unspecified: Secondary | ICD-10-CM | POA: Insufficient documentation

## 2015-09-24 DIAGNOSIS — R413 Other amnesia: Secondary | ICD-10-CM | POA: Insufficient documentation

## 2015-09-24 NOTE — Assessment & Plan Note (Signed)
HCV pending.  He opted in.

## 2015-09-24 NOTE — Assessment & Plan Note (Signed)
MMSE 26/30, -2 for orientation, -1 attention, -1 recall Broad ddx d/w pt.  Check basic labs for reversible causes today.  Likely will get head CT.  If all neg, will need consideration for med effect.  Not taking Remus Loffler consistently to explain sx.   Depression could cause pseudodementia and his mood has been affected by recent illnesses, but still okay for outpatient f/u, d/w pt.   Possible that patient could have early dementia but this isn't certain.   ddx d/w pt and wife.  All understand ddx and the point for going through testing, etc.  No change in meds at this point.  >25 minutes spent in face to face time with patient, >50% spent in counselling or coordination of care.

## 2015-09-26 ENCOUNTER — Inpatient Hospital Stay: Admission: RE | Admit: 2015-09-26 | Payer: Medicare Other | Source: Ambulatory Visit

## 2015-09-27 ENCOUNTER — Encounter: Payer: Self-pay | Admitting: Internal Medicine

## 2015-09-27 ENCOUNTER — Observation Stay (HOSPITAL_COMMUNITY)
Admission: EM | Admit: 2015-09-27 | Discharge: 2015-09-28 | Disposition: A | Discharge status: Home / Self Care | Payer: Medicare Other | Attending: Internal Medicine | Admitting: Internal Medicine

## 2015-09-27 ENCOUNTER — Inpatient Hospital Stay: Admission: RE | Admit: 2015-09-27 | Payer: Medicare Other | Source: Ambulatory Visit

## 2015-09-27 ENCOUNTER — Emergency Department (HOSPITAL_COMMUNITY): Payer: Medicare Other

## 2015-09-27 ENCOUNTER — Encounter (HOSPITAL_COMMUNITY): Payer: Self-pay | Admitting: Emergency Medicine

## 2015-09-27 DIAGNOSIS — E785 Hyperlipidemia, unspecified: Secondary | ICD-10-CM | POA: Diagnosis not present

## 2015-09-27 DIAGNOSIS — I480 Paroxysmal atrial fibrillation: Secondary | ICD-10-CM | POA: Diagnosis not present

## 2015-09-27 DIAGNOSIS — R001 Bradycardia, unspecified: Secondary | ICD-10-CM

## 2015-09-27 DIAGNOSIS — I4901 Ventricular fibrillation: Secondary | ICD-10-CM

## 2015-09-27 DIAGNOSIS — I499 Cardiac arrhythmia, unspecified: Secondary | ICD-10-CM | POA: Diagnosis not present

## 2015-09-27 DIAGNOSIS — Z95810 Presence of automatic (implantable) cardiac defibrillator: Secondary | ICD-10-CM | POA: Insufficient documentation

## 2015-09-27 DIAGNOSIS — Z7982 Long term (current) use of aspirin: Secondary | ICD-10-CM | POA: Diagnosis not present

## 2015-09-27 DIAGNOSIS — I5022 Chronic systolic (congestive) heart failure: Secondary | ICD-10-CM | POA: Insufficient documentation

## 2015-09-27 DIAGNOSIS — I472 Ventricular tachycardia, unspecified: Secondary | ICD-10-CM

## 2015-09-27 DIAGNOSIS — M1990 Unspecified osteoarthritis, unspecified site: Secondary | ICD-10-CM | POA: Diagnosis not present

## 2015-09-27 DIAGNOSIS — I255 Ischemic cardiomyopathy: Secondary | ICD-10-CM | POA: Insufficient documentation

## 2015-09-27 DIAGNOSIS — F17210 Nicotine dependence, cigarettes, uncomplicated: Secondary | ICD-10-CM | POA: Diagnosis not present

## 2015-09-27 DIAGNOSIS — F419 Anxiety disorder, unspecified: Secondary | ICD-10-CM | POA: Insufficient documentation

## 2015-09-27 DIAGNOSIS — I481 Persistent atrial fibrillation: Secondary | ICD-10-CM | POA: Diagnosis not present

## 2015-09-27 DIAGNOSIS — I252 Old myocardial infarction: Secondary | ICD-10-CM | POA: Insufficient documentation

## 2015-09-27 DIAGNOSIS — I2510 Atherosclerotic heart disease of native coronary artery without angina pectoris: Secondary | ICD-10-CM | POA: Insufficient documentation

## 2015-09-27 DIAGNOSIS — Z880 Allergy status to penicillin: Secondary | ICD-10-CM | POA: Diagnosis not present

## 2015-09-27 DIAGNOSIS — R079 Chest pain, unspecified: Secondary | ICD-10-CM | POA: Diagnosis not present

## 2015-09-27 DIAGNOSIS — J449 Chronic obstructive pulmonary disease, unspecified: Secondary | ICD-10-CM | POA: Diagnosis not present

## 2015-09-27 DIAGNOSIS — R42 Dizziness and giddiness: Secondary | ICD-10-CM | POA: Diagnosis not present

## 2015-09-27 LAB — CBC WITH DIFFERENTIAL/PLATELET
Basophils Absolute: 0 10*3/uL (ref 0.0–0.1)
Basophils Relative: 0 %
EOS PCT: 3 %
Eosinophils Absolute: 0.2 10*3/uL (ref 0.0–0.7)
HCT: 42.9 % (ref 39.0–52.0)
HEMOGLOBIN: 13.2 g/dL (ref 13.0–17.0)
LYMPHS ABS: 2 10*3/uL (ref 0.7–4.0)
LYMPHS PCT: 22 %
MCH: 29.1 pg (ref 26.0–34.0)
MCHC: 30.8 g/dL (ref 30.0–36.0)
MCV: 94.5 fL (ref 78.0–100.0)
MONOS PCT: 7 %
Monocytes Absolute: 0.7 10*3/uL (ref 0.1–1.0)
Neutro Abs: 6.1 10*3/uL (ref 1.7–7.7)
Neutrophils Relative %: 68 %
PLATELETS: 251 10*3/uL (ref 150–400)
RBC: 4.54 MIL/uL (ref 4.22–5.81)
RDW: 15 % (ref 11.5–15.5)
WBC: 9 10*3/uL (ref 4.0–10.5)

## 2015-09-27 LAB — BASIC METABOLIC PANEL
Anion gap: 7 (ref 5–15)
BUN: 17 mg/dL (ref 6–20)
CHLORIDE: 105 mmol/L (ref 101–111)
CO2: 29 mmol/L (ref 22–32)
Calcium: 8.7 mg/dL — ABNORMAL LOW (ref 8.9–10.3)
Creatinine, Ser: 1.19 mg/dL (ref 0.61–1.24)
GFR calc Af Amer: >60 mL/min (ref >60)
GFR calc non Af Amer: 60 mL/min — ABNORMAL LOW (ref >60)
GLUCOSE: 123 mg/dL — AB (ref 65–99)
POTASSIUM: 3.5 mmol/L (ref 3.5–5.1)
Sodium: 141 mmol/L (ref 135–145)

## 2015-09-27 LAB — MAGNESIUM: Magnesium: 2.1 mg/dL (ref 1.7–2.4)

## 2015-09-27 LAB — BRAIN NATRIURETIC PEPTIDE: B Natriuretic Peptide: 184.8 pg/mL — ABNORMAL HIGH (ref 0.0–100.0)

## 2015-09-27 MED ORDER — ACETAMINOPHEN 325 MG PO TABS: 650.0000 mg | ORAL_TABLET | ORAL | Status: DC | PRN

## 2015-09-27 MED ORDER — ASPIRIN EC 81 MG PO TBEC: 81.0000 mg | DELAYED_RELEASE_TABLET | Freq: Every day | ORAL | Status: DC

## 2015-09-27 MED ORDER — ONDANSETRON HCL 4 MG/2ML IJ SOLN
4.0000 mg | Freq: Four times a day (QID) | INTRAMUSCULAR | Status: DC | PRN
Start: 2015-09-27 — End: 2015-09-28

## 2015-09-27 MED ORDER — TRAMADOL HCL 50 MG PO TABS: 50.0000 mg | ORAL_TABLET | Freq: Four times a day (QID) | ORAL | Status: DC | PRN

## 2015-09-27 MED ORDER — AMIODARONE HCL 200 MG PO TABS
400.0000 mg | ORAL_TABLET | Freq: Two times a day (BID) | ORAL | Status: DC
Start: 2015-09-27 — End: 2015-09-27

## 2015-09-27 MED ORDER — AMIODARONE IV BOLUS ONLY 150 MG/100ML
150.0000 mg | Freq: Once | INTRAVENOUS | Status: AC
  Administered 2015-09-27: 150 mg via INTRAVENOUS
  Filled 2015-09-27: qty 100

## 2015-09-27 MED ORDER — DIAZEPAM 5 MG PO TABS: 5.0000 mg | ORAL_TABLET | Freq: Four times a day (QID) | ORAL | Status: DC | PRN

## 2015-09-27 MED ORDER — FUROSEMIDE 20 MG PO TABS: 20.0000 mg | ORAL_TABLET | Freq: Every day | ORAL | Status: DC | PRN

## 2015-09-27 MED ORDER — SODIUM CHLORIDE 0.9% FLUSH
3.0000 mL | Freq: Two times a day (BID) | INTRAVENOUS | Status: DC
  Administered 2015-09-28: 3 mL via INTRAVENOUS

## 2015-09-27 MED ORDER — MEXILETINE HCL 200 MG PO CAPS: 200.0000 mg | ORAL_CAPSULE | Freq: Two times a day (BID) | ORAL | Status: DC

## 2015-09-27 MED ORDER — FAMOTIDINE 20 MG PO TABS
20.0000 mg | ORAL_TABLET | Freq: Every day | ORAL | Status: DC
  Administered 2015-09-27 – 2015-09-28 (×2): 20 mg via ORAL
  Filled 2015-09-27 (×2): qty 1

## 2015-09-27 MED ORDER — RANOLAZINE ER 500 MG PO TB12
500.0000 mg | ORAL_TABLET | Freq: Two times a day (BID) | ORAL | Status: DC
  Administered 2015-09-27 – 2015-09-28 (×2): 500 mg via ORAL
  Filled 2015-09-27 (×2): qty 1

## 2015-09-27 MED ORDER — SODIUM CHLORIDE 0.9 % WEIGHT BASED INFUSION
3.0000 mL/kg/h | INTRAVENOUS | Status: DC
  Administered 2015-09-28: 3 mL/kg/h via INTRAVENOUS

## 2015-09-27 MED ORDER — SODIUM CHLORIDE 0.9% FLUSH: 3.0000 mL | INTRAVENOUS | Status: DC | PRN

## 2015-09-27 MED ORDER — APIXABAN 5 MG PO TABS: 5.0000 mg | ORAL_TABLET | Freq: Two times a day (BID) | ORAL | Status: DC

## 2015-09-27 MED ORDER — SODIUM CHLORIDE 0.9 % IV SOLN: 250.0000 mL | INTRAVENOUS | Status: DC | PRN

## 2015-09-27 MED ORDER — LOSARTAN POTASSIUM 25 MG PO TABS
25.0000 mg | ORAL_TABLET | Freq: Two times a day (BID) | ORAL | Status: DC
  Administered 2015-09-27 – 2015-09-28 (×2): 25 mg via ORAL
  Filled 2015-09-27 (×2): qty 1

## 2015-09-27 MED ORDER — ASPIRIN EC 81 MG PO TBEC
81.0000 mg | DELAYED_RELEASE_TABLET | Freq: Every day | ORAL | Status: DC
  Administered 2015-09-28: 81 mg via ORAL
  Filled 2015-09-27 (×2): qty 1

## 2015-09-27 MED ORDER — NITROGLYCERIN 0.4 MG SL SUBL
0.4000 mg | SUBLINGUAL_TABLET | SUBLINGUAL | Status: DC | PRN
  Administered 2015-09-27: 0.4 mg via SUBLINGUAL
  Filled 2015-09-27: qty 1

## 2015-09-27 MED ORDER — AMIODARONE HCL 200 MG PO TABS
200.0000 mg | ORAL_TABLET | Freq: Every day | ORAL | Status: DC
  Administered 2015-09-28: 200 mg via ORAL
  Filled 2015-09-27: qty 1

## 2015-09-27 MED ORDER — CARVEDILOL 3.125 MG PO TABS
3.1250 mg | ORAL_TABLET | Freq: Two times a day (BID) | ORAL | Status: DC
  Administered 2015-09-27 – 2015-09-28 (×2): 3.125 mg via ORAL
  Filled 2015-09-27 (×2): qty 1

## 2015-09-27 MED ORDER — ZOLPIDEM TARTRATE 5 MG PO TABS
5.0000 mg | ORAL_TABLET | Freq: Every evening | ORAL | Status: DC | PRN
  Administered 2015-09-28: 5 mg via ORAL
  Filled 2015-09-27: qty 1

## 2015-09-27 MED ORDER — ASPIRIN 81 MG PO CHEW
81.0000 mg | CHEWABLE_TABLET | ORAL | Status: AC
  Administered 2015-09-28: 81 mg via ORAL
  Filled 2015-09-27: qty 1

## 2015-09-27 MED ORDER — SODIUM CHLORIDE 0.9 % WEIGHT BASED INFUSION: 1.0000 mL/kg/h | INTRAVENOUS | Status: DC

## 2015-09-27 MED ORDER — LOPERAMIDE HCL 2 MG PO CAPS
4.0000 mg | ORAL_CAPSULE | Freq: Once | ORAL | Status: AC
  Administered 2015-09-27: 4 mg via ORAL
  Filled 2015-09-27: qty 2

## 2015-09-27 MED ORDER — NITROGLYCERIN 0.4 MG SL SUBL
0.4000 mg | SUBLINGUAL_TABLET | SUBLINGUAL | Status: DC | PRN
  Administered 2015-09-27: 0.4 mg via SUBLINGUAL

## 2015-09-27 NOTE — ED Notes (Signed)
Pacemaker interrogation completed 

## 2015-09-27 NOTE — H&P (Signed)
H&P    Patient ID: [name] MRN: [id number], DOB/AGE: [birth year] 70 y.o.  Admit date: 09/27/2015 Date of Admission: 09/27/2015  Primary Physician: Crawford Givens, MD Primary Cardiologist: Dr. [name]  Reason for Admission: VT  HPI: [name] is a 70 y.o. male with PMHx of ICM, VT, persistent AFib, SB, chronic CHF (systolic), hx of VT/VF, hospitalized 05/07/15 for recurrent shocks and VT storm, at that time, amiodarone was initiated/loaded at that time, was not felt ischemic w/u was warranted without any symptoms otherwise.  His VT was noted at 200bpm, during this VT storm/shocks he converted to SR, started on Eliquis, though with hx of GIB was concerning and out patient evaluated for watchman, but he declined and has done ok on a/c as of now.  He has been followed out patient by Dr. [name], and his amiodarone slowly reduced, to the current dose of 200mg  daily M-F, and 400mg  daily on weekends, this done at his last OV on 08/29/15 without further VT as of that visit.  The patient reports feeling very well, was awake sitting in a chair relaxed watching TV when he felt a sensation come over him, started sweating on  The back of his neck and was shocked by his device.  He had no syncope, denies any kind of CP, palpitations or SOB, lately, prior to the shock or after.  States this shock didn't feel as strong as the ones he had in April.  He has not had any dizzy spells of late, no near syncope or syncope.  This is the fist shock he has gotten since his VT storm in April.  He reports feeling well, a few weeks ago had a sinus infection that had some associated chest congestion though feels like he is over that.   LABS: K+ 3.5 Mag 2.1 BUN/Creat 17/1.19 BNP 184 H/H 13.2/42.9 WBC 9.0 plts 251  Device information/history: MDT single chamber ICD, implanted 08/26/02, Dr. [name], VT + history of appropriate shocks, VT storm July 2017 AAD tx: amiodarone loaded/started July 2017  Past  Medical History:  Diagnosis Date  . Acute lower GI bleeding 12/11/2011   "first time" (12/11/2011)  . Acute myocardial infarction, unspecified site, episode of care unspecified 1995   Pt living in [place], no stent, ?PTCA  . Allergic rhinitis, cause unspecified   . Anxiety   . Arthritis    knees, back  . Atrial fibrillation (HCC)    a. 05/2015 - converted to sinus in setting of ICD shocks; placed on eliquis 5 bid.  [name] CAD (coronary artery disease), autologous vein bypass graft   . Cervical herniated disc    told not to lift >10 lbs  . Chronic systolic CHF (congestive heart failure), NYHA class 2 (HCC)    Reports EF of 25%.   [name] COPD (chronic obstructive pulmonary disease) (HCC) 11/2012   by xray  . Diverticulosis    by CT scan  . HLD (hyperlipidemia)   . ICD (implantable cardiac defibrillator) in place   . Insomnia   . Paroxysmal ventricular tachycardia (HCC)   . Perennial allergic rhinitis    only to dust mites  . VT (ventricular tachycardia) (HCC)    a. 05/2015 - VT storm with multiple ICD shocks-->Amio 400 BID.     Surgical History:  Past Surgical History:  Procedure Laterality Date  . CARDIAC CATHETERIZATION  2004   LAD 30%, D1 30%, CFX-AV groove 70-80%, OM1 30%, EF 20-25%  . CARDIAC DEFIBRILLATOR PLACEMENT  2004  .  CATARACT EXTRACTION Right 01/2012  . CHOLECYSTECTOMY  1/ 2012  . COLONOSCOPY  01/08/2012   Procedure: COLONOSCOPY;  Surgeon: Iva Boop, MD;  Location: [facility];  Service: Endoscopy;  Laterality: N/A;  . CORONARY ANGIOPLASTY  1995   Pt thinks he got a balloon, living in [place], [place]  . IMPLANTABLE CARDIOVERTER DEFIBRILLATOR GENERATOR CHANGE N/A 02/07/2012   Procedure: IMPLANTABLE CARDIOVERTER DEFIBRILLATOR GENERATOR CHANGE;  Surgeon: Marinus Maw, MD; Medtronic Evera XT VR single-chamber serial number [id number], Laterality: Left  . INSERT / REPLACE / REMOVE PACEMAKER  2004   Medtronic ICD  . PARTIAL KNEE ARTHROPLASTY  ~ 2000   left  . SHOULDER  ARTHROSCOPY W/ ROTATOR CUFF REPAIR  twice   right (12/11/2011)  . TONSILLECTOMY AND ADENOIDECTOMY  ~ 1951      (Not in a hospital admission)  Inpatient Medications:    Allergies:  Allergies  Allergen Reactions  . Ace Inhibitors Other (See Comments)    muscle pain. Tolerates ARBs.   . Codeine Other (See Comments)    "head wants to explode."  . Penicillins Swelling    "started at point of injection; w/in 3 min my upper arm was swollen 3 times normal"  . Lisinopril     Muscle Pain  . Statins Other (See Comments)    Myalgias per patient    Social History   Social History  . Marital status: Married    Spouse name: N/A  . Number of children: 0  . Years of education: N/A   Occupational History  . [employer] truck driver (retired)    Social History Main Topics  . Smoking status: Current Every Day Smoker    Packs/day: 0.25    Years: 50.00    Types: Cigarettes, Cigars  . Smokeless tobacco: Never Used     Comment: ~ 8 cigarettes daily  . Alcohol use No  . Drug use: No  . Sexual activity: No   Other Topics Concern  . Not on file   Social History Narrative   Lives with wife, married 1998   Grown children, 2 great grandchildren   Occupation: retired, was Presenter, broadcasting   Activity: walking, fishing   Diet: good water daily, fruits/vegetables rare      Wife is Product manager.    [id number], Human resources officer. No known agent orange exposure.       Family History  Problem Relation Age of Onset  . Diabetes Father   . Tracheal cancer Father 36    smoker  . Stroke Mother   . Cancer Sister     left eye  . CAD Neg Hx   . Colon cancer Neg Hx   . Prostate cancer Neg Hx    Current Facility-Administered Medications:  .  acetaminophen (TYLENOL) tablet 650 mg, 650 mg, Oral, Q4H PRN, Sheilah Pigeon, PA-C .  Melene Muller ON 09/28/2015] amiodarone (PACERONE) tablet 200 mg, 200 mg, Oral, Daily, Renee Norberto Sorenson, PA-C .  Melene Muller ON 09/28/2015] apixaban (ELIQUIS) tablet 5 mg, 5 mg, Oral, BID, Renee Norberto Sorenson, PA-C .  aspirin EC tablet 81 mg, 81 mg, Oral, Daily, Renee Norberto Sorenson, PA-C .  carvedilol (COREG) tablet 3.125 mg, 3.125 mg, Oral, BID WC, Renee Norberto Sorenson, PA-C .  diazepam (VALIUM) tablet 5 mg, 5 mg, Oral, Q6H PRN, Sheilah Pigeon, PA-C .  famotidine (PEPCID) tablet 20 mg, 20 mg, Oral, Daily, Renee Norberto Sorenson, PA-C .  furosemide (LASIX) tablet 20 mg, 20 mg, Oral, Daily PRN, Ed Blalock  Keitha Butte, PA-C .  losartan (COZAAR) tablet 25 mg, 25 mg, Oral, BID, Renee Norberto Sorenson, PA-C .  nitroGLYCERIN (NITROSTAT) SL tablet 0.4 mg, 0.4 mg, Sublingual, Q5 Min x 3 PRN, Sheilah Pigeon, PA-C .  ondansetron [facility]) injection 4 mg, 4 mg, Intravenous, Q6H PRN, Sheilah Pigeon, PA-C .  ranolazine (RANEXA) 12 hr tablet 500 mg, 500 mg, Oral, BID, Renee Norberto Sorenson, PA-C .  traMADol Janean Sark) tablet 50 mg, 50 mg, Oral, Q6H PRN, Sheilah Pigeon, PA-C .  zolpidem (AMBIEN) tablet 5 mg, 5 mg, Oral, QHS PRN,MR X 1, Renee Norberto Sorenson, PA-C    Review of Systems: All other systems reviewed and are otherwise negative except as noted above.  Physical Exam: Vitals:   09/27/15 0900 09/27/15 0915 09/27/15 0945 09/27/15 1015  BP: 124/76 122/76 121/85 124/82  Pulse: (!) 47 (!) 47 (!) 48   Resp: 22 15 17 20   Temp:      TempSrc:      SpO2: 94% 94% 96% 95%  Weight:      Height:        GEN- The patient is well appearing, alert and oriented x 3 today.   HEENT: normocephalic, atraumatic; sclera clear, conjunctiva pink; hearing intact; oropharynx clear; neck supple, no JVP Lymph- no cervical lymphadenopathy Lungs- Clear to ausculation bilaterally, normal work of breathing.  Slight end exp wheeze R base, rales, rhonchi Heart- RRR, no murmurs, rubs or gallops, PMI not laterally displaced GI- soft, non-tender, non-distended, bowel sounds present Extremities- no clubbing, cyanosis, or edema MS- no significant deformity or atrophy Skin- warm and dry, no rash or lesion Psych- euthymic mood, full affect Neuro- no gross  deficits observed  Labs:   Lab Results  Component Value Date   WBC 9.0 09/27/2015   HGB 13.2 09/27/2015   HCT 42.9 09/27/2015   MCV 94.5 09/27/2015   PLT 251 09/27/2015    Recent Labs Lab 09/22/15 1633 09/27/15 0658  NA 143 141  K 4.9 3.5  CL 106 105  CO2 29 29  BUN 21 17  CREATININE 1.09 1.19  CALCIUM 8.7 8.7*  PROT 6.2  --   BILITOT 0.5  --   ALKPHOS 70  --   ALT 31  --   AST 36*  --   GLUCOSE 80 123*      Radiology/Studies:  Dg Chest Portable 1 View Result Date: 09/27/2015 CLINICAL DATA:  Chest pain with cardiac arrhythmia EXAM: PORTABLE CHEST 1 VIEW COMPARISON:  May 25, 2015 FINDINGS: There is mild atelectatic change in the right lower lobe. The lungs elsewhere clear. Heart is enlarged with pulmonary vascularity within normal limits. Defibrillator lead is attached to the right ventricle. No adenopathy. No pneumothorax. IMPRESSION: Stable cardiomegaly. Mild right base atelectasis. Lungs elsewhere clear. Stable appearing defibrillator and lead. Electronically Signed   By: Bretta Bang III M.D.   On: 09/27/2015 07:02    EKG: SB, RBBB TELEMETRY: SB 50's  Cardiac Cath: 2004 ANGIOGRAPHY: Left mid coronary artery had a 20% discreet lesion. The left anterior descending artery had 30% multiple discreet lesions in the mid vessel. First diagonal branch had 30% tubular lesion. The circumflex artery had 20-30% multiple lesions in the proximal vessel. First obtuse marginal branch had 30% multiple discreet lesions. The AV groove branch just after the takeoff of the first obtuse marginal branch had a 70% tubular lesion. The distal AV groove branch prior to a small trifurcation had a 70-80% eccentric lesion. RAO VENTRICULOGRAM: RAO ventriculography showed global  hypokinesis with inferior, posterior, and apical wall akinesis. The ejection fraction was only in the 20-25% range. There is angiographic grade 1-2 MR. Aortic pressure was in the 144/95  range. LV pressure was in the 144/8 range. IMPRESSION: Films were reviewed with Arturo Mortonhomas D. Riley KillStuckey, M.D. The patient did not have ischemia on this Cardiolite and the AV groove branch is small and would unlikely to be causing problem. The patient has primarily been having fatigue and not chest pain. We both agreed that increased medical therapy is warranted.   Echo: none in system    Assessment and Plan:   1. VT with ICD shock, ICM w/ICD     Interrogation of device notes a VT episode that he received 2 ATP therapies for and 1 shock, a number of NSVT episodes, and another VT episode he received ATP for that slowed the tachycardia.  Morphology appears that he has 2 different VT's  2. CAD     No c/o CP  3. PAFib, in SB here     AF historically felt to be permanent though after VT storm/shocks in April >> SR     CHA2DS2Vasc is at least 3 on Eliquis     Hx of GIB, tolerating Eliquis so far (since April)  Reviewed the case with Dr. Graciela HusbandsKlein, we will admit for observation given the number of NSVT and VT episodes, give an amiodarone bolus and up-titrate his amiodarone, and start mexiletine.  Will d/w Dr. Ladona Ridgelaylor who knows the patient well to discuss possible ischemic evaluation.  Will get an echo.   Norma FredricksonSigned, Renee Ursuy, PA-C 09/27/2015 10:29 AM       Patient seen and examined. Remote MI 20+ years ago with no interval cath in the last 15 years. He has had recurrent ventricular tachycardia with storm a few months ago and recurrent ventricular tachycardia last night preceded by an irregularly irregular rhythm. There were multiple morphologies of monomorphic ventricular tachycardia, some some of which fell below detection,  others of which failed to terminate with antitachycardia pacing ultimately sinus rhythm was restored with shock.  He has known left ventricular dysfunction and atrial fibrillation.  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 Hence, more amiodarone is not likely going to be beneficial. Indeed, I would have a low threshold for upgrading his device so as to allow for augmented beta-blockade if this were necessary.  I discussed with Dr. Ladona Ridgelaylor. We will plan to use ranolazine as an augmented anti-arrhythmic as well as anti-ischemic agent. I discussed with the patient and will plan undertaking catheterization tomorrow risks and benefits which were reviewed  He has atrial fibrillation and is on apixoban. It will be held for his catheterization. It is not clear to me why he is on aspirin. We will discontinue it. It may need to be resumed depending on findings of catheterization

## 2015-09-27 NOTE — ED Notes (Signed)
Per EDP ok for pt. To self administer morning medications.

## 2015-09-27 NOTE — ED Triage Notes (Signed)
Pt brought in by EMS from home after reporting that his pacemaker defibrillator fired once. Per EMS no other episodes en route. Pt reports feeling diaphoretic around the time of the firing but states he did not have any chest pain before it fired.

## 2015-09-27 NOTE — Discharge Instructions (Signed)

## 2015-09-27 NOTE — ED Provider Notes (Signed)
[facility]ENCY DEPT Provider Note   CSN: [id number] Arrival date & time: 09/27/15  3202  History   Chief Complaint Chief Complaint  Patient presents with  . Pacemaker Problem    HPI [name] is a 70 y.o. male with ICD placement in early 2000s for a.fib.  HPI Patient presents after ICD shock. Patient reports waking from sleep this morning around 5:45AM feeling diaphoretic and warm. Also endorses tingling in his upper extremities. He then felt his ICD fire. He denies chest pain, SOB, or any other symptoms than diaphoresis. After his ICD fired, he felt fine. He took a nitroglycerin tablet; he did not feel differently after taking this, but said since it didn't give him a headache, he felt it was helpful. He subsequently presented to ED. He is still denying any symptoms.  Of note, he was admitted four months ago after 2 ICD shocks for VT at 200 bmp. He has since been started on amiodarone and Eliquis. He is followed by cardiologist Dr. Lewayne Bunting, and was last seen a few weeks ago.    Past Medical History:  Diagnosis Date  . Acute lower GI bleeding 12/11/2011   "first time" (12/11/2011)  . Acute myocardial infarction, unspecified site, episode of care unspecified 1995   Pt living in [place], no stent, ?PTCA  . Allergic rhinitis, cause unspecified   . Anxiety   . Arthritis    knees, back  . Atrial fibrillation (HCC)    a. 05/2015 - converted to sinus in setting of ICD shocks; placed on eliquis 5 bid.  [name] CAD (coronary artery disease), autologous vein bypass graft   . Cervical herniated disc    told not to lift >10 lbs  . Chronic systolic CHF (congestive heart failure), NYHA class 2 (HCC)    Reports EF of 25%.   [name] COPD (chronic obstructive pulmonary disease) (HCC) 11/2012   by xray  . Diverticulosis    by CT scan  . HLD (hyperlipidemia)   . ICD (implantable cardiac defibrillator) in place   . Insomnia   . Paroxysmal ventricular tachycardia (HCC)   . Perennial allergic  rhinitis    only to dust mites  . VT (ventricular tachycardia) (HCC)    a. 05/2015 - VT storm with multiple ICD shocks-->Amio 400 BID.    Patient Active Problem List   Diagnosis Date Noted  . Memory loss 09/24/2015  . Encounter for screening examination for infectious disease 09/24/2015  . A-fib (HCC) 05/28/2015  . VT (ventricular tachycardia) (HCC) 05/25/2015  . Ventricular fibrillation (HCC) 05/25/2015  . VF (ventricular fibrillation) (HCC) 05/25/2015  . Syncope and collapse 05/12/2015  . Hypotension 05/12/2015  . Bradycardia 05/12/2015  . Acute non-recurrent maxillary sinusitis 03/23/2015  . Nausea with vomiting 01/05/2015  . Advance care planning 09/16/2014  . Muscle ache 09/16/2014  . Back pain 09/08/2013  . Abdominal pain, chronic, epigastric 09/08/2013  . Fatigue 06/11/2013  . Chest wall mass 11/05/2012  . Medicare annual wellness visit, subsequent 06/26/2012  . Arthritis   . Insomnia   . Anxiety   . Diverticulosis 12/11/2011  . Tobacco abuse 03/06/2011  . Ischemic cardiomyopathy 02/12/2011  . Chronic systolic CHF (congestive heart failure) (HCC) 05/10/2010  . Automatic implantable cardioverter-defibrillator in situ 08/23/2009  . Coronary atherosclerosis 10/31/2008  . Hyperlipidemia 08/25/2008  . ALLERGIC RHINITIS 08/25/2008  . COPD, MILD 08/25/2008    Past Surgical History:  Procedure Laterality Date  . CARDIAC CATHETERIZATION  2004   LAD 30%, D1 30%, CFX-AV groove  70-80%, OM1 30%, EF 20-25%  . CARDIAC DEFIBRILLATOR PLACEMENT  2004  . CATARACT EXTRACTION Right 01/2012  . CHOLECYSTECTOMY  1/ 2012  . COLONOSCOPY  01/08/2012   Procedure: COLONOSCOPY;  Surgeon: Iva Boop, MD;  Location: [facility];  Service: Endoscopy;  Laterality: N/A;  . CORONARY ANGIOPLASTY  1995   Pt thinks he got a balloon, living in [place], [place]  . IMPLANTABLE CARDIOVERTER DEFIBRILLATOR GENERATOR CHANGE N/A 02/07/2012   Procedure: IMPLANTABLE CARDIOVERTER DEFIBRILLATOR GENERATOR  CHANGE;  Surgeon: Marinus Maw, MD; Medtronic Evera XT VR single-chamber serial number [id number], Laterality: Left  . INSERT / REPLACE / REMOVE PACEMAKER  2004   Medtronic ICD  . PARTIAL KNEE ARTHROPLASTY  ~ 2000   left  . SHOULDER ARTHROSCOPY W/ ROTATOR CUFF REPAIR  twice   right (12/11/2011)  . TONSILLECTOMY AND ADENOIDECTOMY  ~ 1951     Home Medications    Prior to Admission medications   Medication Sig Start Date End Date Taking? Authorizing Provider  amiodarone (PACERONE) 200 MG tablet Take 200 mg by mouth Mon-Fri and 400 mg on Sat and Sun 07/25/15  Yes Marinus Maw, MD  apixaban (ELIQUIS) 5 MG TABS tablet Take 1 tablet (5 mg total) by mouth 2 (two) times daily. 05/28/15  Yes Ok Anis, NP  aspirin 81 MG EC tablet Take 1 tablet (81 mg total) by mouth daily. 05/28/15  Yes Ok Anis, NP  benzonatate (TESSALON) 200 MG capsule TAKE 1 CAPSULE BY MOUTH 3 TIMES DAILY AS NEEDED FOR COUGH 10/31/14  Yes Joaquim Nam, MD  carvedilol (COREG) 3.125 MG tablet Take 3.125 mg by mouth 2 (two) times daily with a meal.   Yes Historical Provider, MD  diazepam (VALIUM) 5 MG tablet TAKE 1 TABLET BY MOUTH EVERY 6 HOURS AS NEEDED Patient taking differently: TAKE 1 TABLET BY MOUTH EVERY 6 HOURS AS NEEDED FOR ANXIETY OR SLEEP 08/09/15  Yes Joaquim Nam, MD  fexofenadine (ALLEGRA) 180 MG tablet Take 180 mg by mouth daily as needed for allergies or rhinitis.   Yes Historical Provider, MD  furosemide (LASIX) 20 MG tablet Take 1 tablet by mouth daily as needed for fluid/ swelling   Yes Historical Provider, MD  HYDROcodone-acetaminophen (NORCO/VICODIN) 5-325 MG tablet TAKE 1 TABLET BY MOUTH EVERY 6 HOURS AS NEEDED FOR PAIN 01/17/15  Yes Joaquim Nam, MD  losartan (COZAAR) 50 MG tablet Take 25 mg by mouth 2 (two) times daily.   Yes Historical Provider, MD  metaxalone (SKELAXIN) 800 MG tablet TAKE 1 TABLET BY MOUTH 3 TIMES A DAY AS NEEDED FOR PAIN. 05/31/15  Yes Joaquim Nam, MD  Multiple  Vitamins-Minerals (MULTIVITAMIN ADULTS 50+) TABS Take 1 tablet by mouth daily.   Yes Historical Provider, MD  NITROSTAT 0.4 MG SL tablet PLACE 1 TABLET (0.4 MG TOTAL) UNDER THE TONGUE EVERY 5 (FIVE) MINUTES AS NEEDED. FOR CHEST PAIN. 06/11/15  Yes Joaquim Nam, MD  promethazine (PHENERGAN) 25 MG tablet TAKE 1 TABLET BY MOUTH EVERY 6 HOURS AS NEEDED FOR NAUSEA/VOMITING 02/22/15  Yes Joaquim Nam, MD  ranitidine (ZANTAC) 150 MG capsule Take 150 mg by mouth daily as needed for heartburn.    Yes Historical Provider, MD  traMADol (ULTRAM) 50 MG tablet Take 50-100 mg by mouth every 8 (eight) hours as needed (pain).   Yes Historical Provider, MD  zolpidem (AMBIEN CR) 12.5 MG CR tablet TAKE 1 TABLET BY MOUTH AT BEDTIME Patient taking differently: TAKE 1 TABLET BY  MOUTH AT BEDTIME AS NEEDED FOR SLEEP 07/19/15  Yes Joaquim Nam, MD    Family History Family History  Problem Relation Age of Onset  . Diabetes Father   . Tracheal cancer Father 55    smoker  . Stroke Mother   . Cancer Sister     left eye  . CAD Neg Hx   . Colon cancer Neg Hx   . Prostate cancer Neg Hx     Social History Social History  Substance Use Topics  . Smoking status: Current Every Day Smoker    Packs/day: 0.25    Years: 50.00    Types: Cigarettes, Cigars  . Smokeless tobacco: Never Used     Comment: ~ 8 cigarettes daily  . Alcohol use No   Allergies   Ace inhibitors; Codeine; Penicillins; Lisinopril; and Statins  Review of Systems Review of Systems  Constitutional: Positive for diaphoresis.  Respiratory: Positive for wheezing (chronic). Negative for chest tightness and shortness of breath.   Cardiovascular: Negative for chest pain, palpitations and leg swelling.  Gastrointestinal: Negative for nausea and vomiting.  Allergic/Immunologic: Negative for immunocompromised state.  Neurological: Negative for dizziness and light-headedness.   Physical Exam Updated Vital Signs BP 122/96   Pulse (!) 50   Temp  97.8 F (36.6 C) (Oral)   Resp (!) 32   Ht 5\' 3"  (1.6 m)   Wt 65.8 kg   SpO2 98%   BMI 25.69 kg/m   Physical Exam  Constitutional: He is oriented to person, place, and time. He appears well-developed and well-nourished.  Sitting up in bed in NAD; wife at bedside  HENT:  Head: Normocephalic and atraumatic.  Nose: Nose normal.  Mouth/Throat: Oropharynx is clear and moist. No oropharyngeal exudate.  Eyes: Conjunctivae and EOM are normal. Pupils are equal, round, and reactive to light. Right eye exhibits no discharge. Left eye exhibits no discharge.  Cardiovascular: Regular rhythm and normal heart sounds.   Bradycardic, paced rhythm  Pulmonary/Chest: Effort normal. No respiratory distress. He has wheezes (Diffuse bilaterally).  Abdominal: Soft. Bowel sounds are normal. He exhibits no distension. There is no tenderness.  Musculoskeletal: He exhibits no edema or tenderness.  Neurological: He is alert and oriented to person, place, and time.  Skin: Skin is warm and dry. He is not diaphoretic.  Psychiatric: He has a normal mood and affect. His behavior is normal.   ED Treatments / Results  Labs (all labs ordered are listed, but only abnormal results are displayed) Labs Reviewed  BASIC METABOLIC PANEL - Abnormal; Notable for the following:       Result Value   Glucose, Bld 123 (*)    Calcium 8.7 (*)    GFR calc non Af Amer 60 (*)    All other components within normal limits  BRAIN NATRIURETIC PEPTIDE - Abnormal; Notable for the following:    B Natriuretic Peptide 184.8 (*)    All other components within normal limits  CBC WITH DIFFERENTIAL/PLATELET  MAGNESIUM    EKG  EKG Interpretation  Date/Time:  Wednesday September 27 2015 06:38:04 EDT Ventricular Rate:  54 PR Interval:    QRS Duration: 208 QT Interval:  540 QTC Calculation: 512 R Axis:   149 Text Interpretation:  Age not entered, assumed to be  70 years old for purpose of ECG interpretation Junctional rhythm Right bundle  branch block No significant change since last tracing Confirmed by WARD,  DO, KRISTEN ([id number]) on 09/27/2015 6:51:54 AM  Radiology Dg Chest Portable 1 View  Result Date: 09/27/2015 CLINICAL DATA:  Chest pain with cardiac arrhythmia EXAM: PORTABLE CHEST 1 VIEW COMPARISON:  May 25, 2015 FINDINGS: There is mild atelectatic change in the right lower lobe. The lungs elsewhere clear. Heart is enlarged with pulmonary vascularity within normal limits. Defibrillator lead is attached to the right ventricle. No adenopathy. No pneumothorax. IMPRESSION: Stable cardiomegaly. Mild right base atelectasis. Lungs elsewhere clear. Stable appearing defibrillator and lead. Electronically Signed   By: Bretta BangWilliam  Woodruff III M.D.   On: 09/27/2015 07:02    Procedures Procedures (including critical care time)  Medications Ordered in ED Medications  nitroGLYCERIN (NITROSTAT) SL tablet 0.4 mg (0.4 mg Sublingual Given 09/27/15 0713)  amiodarone (NEXTERONE) IV bolus only 150 mg/100 mL (not administered)  loperamide (IMODIUM) capsule 4 mg (4 mg Oral Given 09/27/15 1242)   Initial Impression / Assessment and Plan / ED Course  I have reviewed the triage vital signs and the nursing notes.  Pertinent labs & imaging results that were available during my care of the patient were reviewed by me and considered in my medical decision making (see chart for details).  Clinical Course   0720 During encounter with patient, he reported feeling warm and diaphoretic again, with tingling in his upper extremities. He asked for nitro SL. Symptoms passed after about 15 seconds. Received nitro SL shortly thereafter. Denying any symptoms at this time. Will continue to wait for ICD interrogation and reassess.   0820 ICD interrogation showing vtach that converted back to sinus, followed by v.fib preceding ICD shock this morning. Cards consulted.   Final Clinical Impressions(s) / ED Diagnoses   Final diagnoses:  None   Patient  presenting after ICD shock. Upon interrogation of ICD, found to have two episodes of VT and VF. Cardiology consulted, who recommended amiodarone bolus and placing patient in observation. Amiodarone bolus administered prior to leaving ED.   New Prescriptions New Prescriptions   No medications on file     Marquette SaaAbigail Joseph Zyshonne Malecha, MD 09/27/15 1252    Lyndal Pulleyaniel Knott, MD 09/27/15 Windy [id number]

## 2015-09-27 NOTE — ED Provider Notes (Signed)
MSE was initiated and I personally evaluated the patient and placed orders (if any) at  6:56 AM on September 27, 2015.  The patient appears stable so that the remainder of the MSE may be completed by another provider.    EKG Interpretation  Date/Time:  Wednesday September 27 2015 06:38:04 EDT Ventricular Rate:  54 PR Interval:    QRS Duration: 208 QT Interval:  540 QTC Calculation: 512 R Axis:   149 Text Interpretation:  Age not entered, assumed to be  70 years old for purpose of ECG interpretation Junctional rhythm Right bundle branch block No significant change since last tracing Confirmed by WARD,  DO, KRISTEN [phone]) on 09/27/2015 6:51:54 AM       Pt is a 70 y.o. male with history of pacemaker/defibrillator he felt his disability or fire at 5:45 AM. States that he knew it was going happen because he felt very hot and became diaphoretic around his neck. Denies any chest pain or chest discomfort, shortness of breath. States he is feeling back to his baseline. States he had a similar episode in April that he thinks was atrial flutter and then an episode of ventricular tachycardia versus ventricular fibrillation. At this time he is medically stable and without symptoms. I feel he is stable to wait for the next provider for further evaluation and management. Labs, chest x-ray and interrogation of his pacemaker/defibrillator are pending.   Layla Maw Ward, DO 09/27/15 [phone]

## 2015-09-27 NOTE — ED Notes (Addendum)
Placed beside toilet in pt's room. Pt stated, "I have a gallbladder problem, so when I have to go I have to go then."

## 2015-09-28 ENCOUNTER — Ambulatory Visit (HOSPITAL_COMMUNITY): Payer: Medicare Other

## 2015-09-28 ENCOUNTER — Encounter (HOSPITAL_COMMUNITY): Payer: Self-pay | Admitting: Cardiology

## 2015-09-28 ENCOUNTER — Encounter (HOSPITAL_COMMUNITY)
Admission: EM | Disposition: A | Discharge status: Home / Self Care | Payer: Self-pay | Source: Home / Self Care | Attending: Emergency Medicine

## 2015-09-28 DIAGNOSIS — I2510 Atherosclerotic heart disease of native coronary artery without angina pectoris: Secondary | ICD-10-CM | POA: Diagnosis not present

## 2015-09-28 DIAGNOSIS — I472 Ventricular tachycardia: Secondary | ICD-10-CM | POA: Diagnosis not present

## 2015-09-28 HISTORY — PX: CARDIAC CATHETERIZATION: SHX172

## 2015-09-28 LAB — BASIC METABOLIC PANEL
Anion gap: 5 (ref 5–15)
BUN: 16 mg/dL (ref 6–20)
CALCIUM: 8.3 mg/dL — AB (ref 8.9–10.3)
CHLORIDE: 106 mmol/L (ref 101–111)
CO2: 29 mmol/L (ref 22–32)
CREATININE: 1.18 mg/dL (ref 0.61–1.24)
GFR calc non Af Amer: >60 mL/min (ref >60)
Glucose, Bld: 83 mg/dL (ref 65–99)
Potassium: 3.7 mmol/L (ref 3.5–5.1)
SODIUM: 140 mmol/L (ref 135–145)

## 2015-09-28 SURGERY — LEFT HEART CATH AND CORONARY ANGIOGRAPHY
Anesthesia: LOCAL

## 2015-09-28 MED ORDER — FENTANYL CITRATE (PF) 100 MCG/2ML IJ SOLN
INTRAMUSCULAR | Status: DC | PRN
  Administered 2015-09-28: 25 ug via INTRAVENOUS

## 2015-09-28 MED ORDER — SODIUM CHLORIDE 0.9% FLUSH: 3.0000 mL | INTRAVENOUS | Status: DC | PRN

## 2015-09-28 MED ORDER — HEPARIN (PORCINE) IN NACL 2-0.9 UNIT/ML-% IJ SOLN
INTRAMUSCULAR | Status: AC
  Filled 2015-09-28: qty 1500

## 2015-09-28 MED ORDER — MIDAZOLAM HCL 2 MG/2ML IJ SOLN
INTRAMUSCULAR | Status: DC | PRN
  Administered 2015-09-28: 1 mg via INTRAVENOUS

## 2015-09-28 MED ORDER — SODIUM CHLORIDE 0.9 % IV SOLN: 250.0000 mL | INTRAVENOUS | Status: DC | PRN

## 2015-09-28 MED ORDER — HEPARIN SODIUM (PORCINE) 1000 UNIT/ML IJ SOLN
INTRAMUSCULAR | Status: DC | PRN
  Administered 2015-09-28: 3500 [IU] via INTRAVENOUS

## 2015-09-28 MED ORDER — VERAPAMIL HCL 2.5 MG/ML IV SOLN
INTRAVENOUS | Status: AC
  Filled 2015-09-28: qty 2

## 2015-09-28 MED ORDER — LIDOCAINE HCL (PF) 1 % IJ SOLN
INTRAMUSCULAR | Status: AC
  Filled 2015-09-28: qty 30

## 2015-09-28 MED ORDER — RANOLAZINE ER 500 MG PO TB12: 500.0000 mg | ORAL_TABLET | Freq: Two times a day (BID) | ORAL | 1 refills | Status: DC

## 2015-09-28 MED ORDER — SODIUM CHLORIDE 0.9 % WEIGHT BASED INFUSION: 1.0000 mL/kg/h | INTRAVENOUS | Status: DC

## 2015-09-28 MED ORDER — APIXABAN 5 MG PO TABS: 5.0000 mg | ORAL_TABLET | Freq: Two times a day (BID) | ORAL | 6 refills | Status: DC

## 2015-09-28 MED ORDER — FENTANYL CITRATE (PF) 100 MCG/2ML IJ SOLN
INTRAMUSCULAR | Status: AC
  Filled 2015-09-28: qty 2

## 2015-09-28 MED ORDER — HEPARIN SODIUM (PORCINE) 1000 UNIT/ML IJ SOLN
INTRAMUSCULAR | Status: AC
  Filled 2015-09-28: qty 1

## 2015-09-28 MED ORDER — SODIUM CHLORIDE 0.9% FLUSH: 3.0000 mL | Freq: Two times a day (BID) | INTRAVENOUS | Status: DC

## 2015-09-28 MED ORDER — LIDOCAINE HCL (PF) 1 % IJ SOLN
INTRAMUSCULAR | Status: DC | PRN
  Administered 2015-09-28: 5 mL

## 2015-09-28 MED ORDER — MIDAZOLAM HCL 2 MG/2ML IJ SOLN
INTRAMUSCULAR | Status: AC
  Filled 2015-09-28: qty 2

## 2015-09-28 MED ORDER — VERAPAMIL HCL 2.5 MG/ML IV SOLN
INTRAVENOUS | Status: DC | PRN
  Administered 2015-09-28: 12:00:00 via INTRA_ARTERIAL

## 2015-09-28 MED ORDER — IOPAMIDOL (ISOVUE-370) INJECTION 76%
INTRAVENOUS | Status: AC
  Filled 2015-09-28: qty 100

## 2015-09-28 MED ORDER — IOPAMIDOL (ISOVUE-370) INJECTION 76%
INTRAVENOUS | Status: DC | PRN
  Administered 2015-09-28: 95 mL via INTRA_ARTERIAL

## 2015-09-28 MED ORDER — HEPARIN (PORCINE) IN NACL 2-0.9 UNIT/ML-% IJ SOLN
INTRAMUSCULAR | Status: DC | PRN
  Administered 2015-09-28: 1500 mL

## 2015-09-28 MED ORDER — APIXABAN 5 MG PO TABS
5.0000 mg | ORAL_TABLET | Freq: Two times a day (BID) | ORAL | Status: DC
Start: 2015-09-29 — End: 2015-09-28

## 2015-09-28 SURGICAL SUPPLY — 12 items
CATH INFINITI 5 FR JL3.5 (CATHETERS) ×1 IMPLANT
CATH INFINITI 5FR ANG PIGTAIL (CATHETERS) ×1 IMPLANT
CATH INFINITI 5FR JL4 (CATHETERS) ×1 IMPLANT
CATH INFINITI JR4 5F (CATHETERS) ×1 IMPLANT
DEVICE RAD COMP TR BAND LRG (VASCULAR PRODUCTS) ×1 IMPLANT
GLIDESHEATH SLEND SS 6F .021 (SHEATH) ×2 IMPLANT
KIT HEART LEFT (KITS) ×2 IMPLANT
PACK CARDIAC CATHETERIZATION (CUSTOM PROCEDURE TRAY) ×2 IMPLANT
SYR MEDRAD MARK V 150ML (SYRINGE) ×2 IMPLANT
TRANSDUCER W/STOPCOCK (MISCELLANEOUS) ×2 IMPLANT
TUBING CIL FLEX 10 FLL-RA (TUBING) ×2 IMPLANT
WIRE SAFE-T 1.5MM-J .035X260CM (WIRE) ×1 IMPLANT

## 2015-09-28 NOTE — H&P (View-Only) (Signed)
SUBJECTIVE: The patient is doing well today.  At this time, he denies chest pain, shortness of breath, or any new concerns.  CURRENT MEDICATIONS: . amiodarone  200 mg Oral Daily  . apixaban  5 mg Oral BID  . aspirin EC  81 mg Oral Daily  . carvedilol  3.125 mg Oral BID WC  . famotidine  20 mg Oral Daily  . losartan  25 mg Oral BID  . ranolazine  500 mg Oral BID  . sodium chloride flush  3 mL Intravenous Q12H   . sodium chloride 1 mL/kg/hr (09/28/15 0711)    OBJECTIVE: Physical Exam: Vitals:   09/27/15 2148 09/27/15 2345 09/28/15 0403 09/28/15 0803  BP: 132/81 (!) 115/55 (!) 156/75 135/77  Pulse:  (!) 47 (!) 48 (!) 48  Resp:  16 16 15   Temp:  98 F (36.7 C) 98 F (36.7 C) 97.5 F (36.4 C)  TempSrc:  Oral Oral Oral  SpO2:  96% 93% 95%  Weight:   147 lb 6.4 oz (66.9 kg)   Height:       No intake or output data in the 24 hours ending 09/28/15 0814  Telemetry reveals sinus bradycardia   GEN- The patient is well appearing, alert and oriented x 3 today.   Head- normocephalic, atraumatic Eyes-  Sclera clear, conjunctiva pink Ears- hearing intact Oropharynx- clear Neck- supple  Lungs- Clear to ausculation bilaterally, normal work of breathing Heart- Bradycardic regular rate and rhythm  GI- soft, NT, ND, + BS Extremities- no clubbing, cyanosis, or edema Skin- no rash or lesion Psych- euthymic mood, full affect Neuro- strength and sensation are intact  LABS: Basic Metabolic Panel:  Recent Labs  82/50/03 0658 09/28/15 0350  NA 141 140  K 3.5 3.7  CL 105 106  CO2 29 29  GLUCOSE 123* 83  BUN 17 16  CREATININE 1.19 1.18  CALCIUM 8.7* 8.3*  MG 2.1  --    CBC:  Recent Labs  09/27/15 0658  WBC 9.0  NEUTROABS 6.1  HGB 13.2  HCT 42.9  MCV 94.5  PLT 251    RADIOLOGY: Dg Chest Portable 1 View Result Date: 09/27/2015 CLINICAL DATA:  Chest pain with cardiac arrhythmia EXAM: PORTABLE CHEST 1 VIEW COMPARISON:  May 25, 2015 FINDINGS: There is mild  atelectatic change in the right lower lobe. The lungs elsewhere clear. Heart is enlarged with pulmonary vascularity within normal limits. Defibrillator lead is attached to the right ventricle. No adenopathy. No pneumothorax. IMPRESSION: Stable cardiomegaly. Mild right base atelectasis. Lungs elsewhere clear. Stable appearing defibrillator and lead. Electronically Signed   By: Bretta Bang III M.D.   On: 09/27/2015 07:02    ASSESSMENT AND PLAN:  Active Problems:   Ventricular tachycardia (HCC)  1.  Ventricular tachycardia with appropriate ICD therapy Continue amiodarone and Ranexa Consider Mexelitine if recurrence I think eventually he Sonal Dorwart need device upgrade to allow for medical treatment of VT with significant underlying bradycardia No driving x6 months Cath today to evaluate for ischemic cause of VT  2.  CAD No recent ischemic symptoms Cath today as above  3.  Paroxysmal atrial fibrillation Continue Eliquis for CHADS2VASC of 3 Prior GI bleed and has previously been evaluate for Watchman but is tolerating Eliquis for now and wanted to defer  Gypsy Balsam, NP 09/28/2015 8:16 AM  I have seen and examined this patient with Gypsy Balsam.  Agree with above, note added to reflect my findings.  On exam, regular rhythm, no murmurs,  lungs clear. Started on ranexa for VT along with his amiodarone.  Kamaury Cutbirth plan for cath today to evaluate his coronaries as a possible cause of his VT.      Nitisha Civello M. Satcha Storlie MD 09/28/2015 10:57 AM

## 2015-09-28 NOTE — Care Management Obs Status (Signed)
MEDICARE OBSERVATION STATUS NOTIFICATION   Patient Details  Name: [name] MRN: [id number] Date of Birth: 07-24-1945   Medicare Observation Status Notification Given:  Yes    Gala Lewandowsky, RN 09/28/2015, 2:55 PM

## 2015-09-28 NOTE — Progress Notes (Signed)
SUBJECTIVE: The patient is doing well today.  At this time, he denies chest pain, shortness of breath, or any new concerns.  CURRENT MEDICATIONS: . amiodarone  200 mg Oral Daily  . apixaban  5 mg Oral BID  . aspirin EC  81 mg Oral Daily  . carvedilol  3.125 mg Oral BID WC  . famotidine  20 mg Oral Daily  . losartan  25 mg Oral BID  . ranolazine  500 mg Oral BID  . sodium chloride flush  3 mL Intravenous Q12H   . sodium chloride 1 mL/kg/hr (09/28/15 0711)    OBJECTIVE: Physical Exam: Vitals:   09/27/15 2148 09/27/15 2345 09/28/15 0403 09/28/15 0803  BP: 132/81 (!) 115/55 (!) 156/75 135/77  Pulse:  (!) 47 (!) 48 (!) 48  Resp:  16 16 15   Temp:  98 F (36.7 C) 98 F (36.7 C) 97.5 F (36.4 C)  TempSrc:  Oral Oral Oral  SpO2:  96% 93% 95%  Weight:   147 lb 6.4 oz (66.9 kg)   Height:       No intake or output data in the 24 hours ending 09/28/15 0814  Telemetry reveals sinus bradycardia   GEN- The patient is well appearing, alert and oriented x 3 today.   Head- normocephalic, atraumatic Eyes-  Sclera clear, conjunctiva pink Ears- hearing intact Oropharynx- clear Neck- supple  Lungs- Clear to ausculation bilaterally, normal work of breathing Heart- Bradycardic regular rate and rhythm  GI- soft, NT, ND, + BS Extremities- no clubbing, cyanosis, or edema Skin- no rash or lesion Psych- euthymic mood, full affect Neuro- strength and sensation are intact  LABS: Basic Metabolic Panel:  Recent Labs  82/50/03 0658 09/28/15 0350  NA 141 140  K 3.5 3.7  CL 105 106  CO2 29 29  GLUCOSE 123* 83  BUN 17 16  CREATININE 1.19 1.18  CALCIUM 8.7* 8.3*  MG 2.1  --    CBC:  Recent Labs  09/27/15 0658  WBC 9.0  NEUTROABS 6.1  HGB 13.2  HCT 42.9  MCV 94.5  PLT 251    RADIOLOGY: Dg Chest Portable 1 View Result Date: 09/27/2015 CLINICAL DATA:  Chest pain with cardiac arrhythmia EXAM: PORTABLE CHEST 1 VIEW COMPARISON:  May 25, 2015 FINDINGS: There is mild  atelectatic change in the right lower lobe. The lungs elsewhere clear. Heart is enlarged with pulmonary vascularity within normal limits. Defibrillator lead is attached to the right ventricle. No adenopathy. No pneumothorax. IMPRESSION: Stable cardiomegaly. Mild right base atelectasis. Lungs elsewhere clear. Stable appearing defibrillator and lead. Electronically Signed   By: Bretta Bang III M.D.   On: 09/27/2015 07:02    ASSESSMENT AND PLAN:  Active Problems:   Ventricular tachycardia (HCC)  1.  Ventricular tachycardia with appropriate ICD therapy Continue amiodarone and Ranexa Consider Mexelitine if recurrence I think eventually he [name] need device upgrade to allow for medical treatment of VT with significant underlying bradycardia No driving x6 months Cath today to evaluate for ischemic cause of VT  2.  CAD No recent ischemic symptoms Cath today as above  3.  Paroxysmal atrial fibrillation Continue Eliquis for CHADS2VASC of 3 Prior GI bleed and has previously been evaluate for Watchman but is tolerating Eliquis for now and wanted to defer  Randy Balsam, NP 09/28/2015 8:16 AM  I have seen and examined this patient with [name].  Agree with above, note added to reflect my findings.  On exam, regular rhythm, no murmurs,  lungs clear. Started on ranexa for VT along with his amiodarone.  [name] plan for cath today to evaluate his coronaries as a possible cause of his VT.      Randy Vivian M. Gissell Barra MD 09/28/2015 10:57 AM   

## 2015-09-28 NOTE — Interval H&P Note (Signed)
History and Physical Interval Note:  09/28/2015 12:02 PM  [name]  has presented today for surgery, with the diagnosis of vt  The various methods of treatment have been discussed with the patient and family. After consideration of risks, benefits and other options for treatment, the patient has consented to  Procedure(s): Left Heart Cath and Coronary Angiography (N/A) as a surgical intervention .  The patient's history has been reviewed, patient examined, no change in status, stable for surgery.  I have reviewed the patient's chart and labs.  Questions were answered to the patient's satisfaction.    Cath Lab Visit (complete for each Cath Lab visit)  Clinical Evaluation Leading to the Procedure:   ACS: No.  Non-ACS:    Anginal Classification: No Symptoms  Anti-ischemic medical therapy: Minimal Therapy (1 class of medications)  Non-Invasive Test Results: No non-invasive testing performed  Prior CABG: No previous CABG       Theron Arista [facility] 09/28/2015 12:02 PM

## 2015-09-28 NOTE — Discharge Summary (Signed)
ELECTROPHYSIOLOGY PROCEDURE DISCHARGE SUMMARY    Patient ID: [name],  MRN: [id number], DOB/AGE: February 24, 1945 70 y.o.  Admit date: 09/27/2015 Discharge date: 09/28/2015  Primary Care Physician: Crawford GivensGraham Duncan, MD Electrophysiologist: Ladona Ridgelaylor  Primary Discharge Diagnosis:  Active Problems:   Ventricular tachycardia [facility](HCC)  Secondary Discharge Diagnosis: 1.  CAD 2.  Persistent atrial fibrillation 3.  Chronic systolic heart failure 4.  COPD 5.  Hyperlipidemia   Allergies  Allergen Reactions  . Ace Inhibitors Other (See Comments)    muscle pain. Tolerates ARBs.   . Codeine Other (See Comments)    "head wants to explode."  . Penicillins Swelling    "started at point of injection; w/in 3 min my upper arm was swollen 3 times normal"  . Lisinopril     Muscle Pain  . Statins Other (See Comments)    Myalgias per patient    Procedures This Admission: 1.  Cardiac catheterization on 09/28/15 by Dr SwazilandJordan. This study demonstrated stable CAD with no culprit lesion for VT. There were no early apparent complications.   Brief HPI/Hospital Course:  [name] is a 70 y.o. male with a past medical history as outlined above. He presented to the ER with ICD shock for VT.  Medical therapy for VT has been limited by bradycardia.  He was placed on Ranexa and monitored on telemetry with no further ventricular arrhythmias.  He underwent cardiac catheterization which showed no acute culprit lesions to explain VT.  Discussed with Dr Ladona Ridgelaylor who did not want to make additional medication changes at this time and recommended follow-up in the office in 2 weeks. No driving with recent appropriate ICD therapy.    Physical Exam: Vitals:   09/28/15 1228 09/28/15 1233 09/28/15 1238 09/28/15 1243  BP: 132/66 139/78 138/77   Pulse: (!) 48 67 (!) 46 (!) 0  Resp: 20 16 13  (!) 0  Temp:      TempSrc:      SpO2: 93% 96% 95% (!) 0%  Weight:      Height:        Labs:   Lab Results    Component Value Date   WBC 9.0 09/27/2015   HGB 13.2 09/27/2015   HCT 42.9 09/27/2015   MCV 94.5 09/27/2015   PLT 251 09/27/2015    Recent Labs Lab 09/22/15 1633  09/28/15 0350  NA 143  < > 140  K 4.9  < > 3.7  CL 106  < > 106  CO2 29  < > 29  BUN 21  < > 16  CREATININE 1.09  < > 1.18  CALCIUM 8.7  < > 8.3*  PROT 6.2  --   --   BILITOT 0.5  --   --   ALKPHOS 70  --   --   ALT 31  --   --   AST 36*  --   --   GLUCOSE 80  < > 83  < > = values in this interval not displayed.   Discharge Medications:  Current Discharge Medication List    START taking these medications   Details  ranolazine (RANEXA) 500 MG 12 hr tablet Take 1 tablet (500 mg total) by mouth 2 (two) times daily. Qty: 60 tablet, Refills: 1      CONTINUE these medications which have CHANGED   Details  apixaban (ELIQUIS) 5 MG TABS tablet Take 1 tablet (5 mg total) by mouth 2 (two) times daily. Resume 09/29/15 Qty: 60 tablet,  Refills: 6      CONTINUE these medications which have NOT CHANGED   Details  amiodarone (PACERONE) 200 MG tablet Take 200 mg by mouth Mon-Fri and 400 mg on Sat and Sun Qty: 180 tablet, Refills: 3   Associated Diagnoses: Ventricular tachycardia (HCC)    aspirin 81 MG EC tablet Take 1 tablet (81 mg total) by mouth daily.    benzonatate (TESSALON) 200 MG capsule TAKE 1 CAPSULE BY MOUTH 3 TIMES DAILY AS NEEDED FOR COUGH Qty: 30 capsule, Refills: 1    carvedilol (COREG) 3.125 MG tablet Take 3.125 mg by mouth 2 (two) times daily with a meal.    diazepam (VALIUM) 5 MG tablet TAKE 1 TABLET BY MOUTH EVERY 6 HOURS AS NEEDED Qty: 45 tablet, Refills: 0    fexofenadine (ALLEGRA) 180 MG tablet Take 180 mg by mouth daily as needed for allergies or rhinitis.    furosemide (LASIX) 20 MG tablet Take 1 tablet by mouth daily as needed for fluid/ swelling    HYDROcodone-acetaminophen (NORCO/VICODIN) 5-325 MG tablet TAKE 1 TABLET BY MOUTH EVERY 6 HOURS AS NEEDED FOR PAIN Qty: 60 tablet, Refills: 0     losartan (COZAAR) 50 MG tablet Take 25 mg by mouth 2 (two) times daily.    metaxalone (SKELAXIN) 800 MG tablet TAKE 1 TABLET BY MOUTH 3 TIMES A DAY AS NEEDED FOR PAIN. Qty: 90 tablet, Refills: 1    Multiple Vitamins-Minerals (MULTIVITAMIN ADULTS 50+) TABS Take 1 tablet by mouth daily.    NITROSTAT 0.4 MG SL tablet PLACE 1 TABLET (0.4 MG TOTAL) UNDER THE TONGUE EVERY 5 (FIVE) MINUTES AS NEEDED. FOR CHEST PAIN. Qty: 25 tablet, Refills: 1    promethazine (PHENERGAN) 25 MG tablet TAKE 1 TABLET BY MOUTH EVERY 6 HOURS AS NEEDED FOR NAUSEA/VOMITING Qty: 30 tablet, Refills: 1    ranitidine (ZANTAC) 150 MG capsule Take 150 mg by mouth daily as needed for heartburn.     traMADol (ULTRAM) 50 MG tablet Take 50-100 mg by mouth every 8 (eight) hours as needed (pain).    zolpidem (AMBIEN CR) 12.5 MG CR tablet TAKE 1 TABLET BY MOUTH AT BEDTIME Qty: 30 tablet, Refills: 5        Disposition: Pt is being discharged home today in good condition. Discharge Instructions    Diet - low sodium heart healthy    Complete by:  As directed   Discharge instructions    Complete by:  As directed   No driving for 6 months   Increase activity slowly    Complete by:  As directed     Follow-up Information    Lewayne Bunting, MD Follow up on 10/20/2015.   Specialty:  Cardiology Why:  at 10:15AM  Contact information: [address] [place] [address] [phone]           Duration of Discharge Encounter: Greater than 30 minutes including physician time.  Signed, Gypsy Balsam, NP 09/28/2015 2:10 PM   I have seen and examined this patient with Gypsy Balsam.  Agree with above, note added to reflect my findings.  On exam, regular rhythm, no murmurs, lungs clear. Presented with VT on his device.  Ranexa was added and no further VT seen.  Cardiac cath showed no acute lesion to explain VT.  Plan for discharge home on Ranexa and amiodarone with follow up in EP clinic..    Aaralyn Kil M.  Toby Ayad MD 09/28/2015 5:46 PM

## 2015-09-28 NOTE — Research (Signed)
LEADERS FREE II Informed Consent   Subject Name: [name]  Subject met inclusion and exclusion criteria.  The informed consent form, study requirements and expectations were reviewed with the subject and questions and concerns were addressed prior to the signing of the consent form.  The subject verbalized understanding of the trail requirements.  The subject agreed to participate in the LEADERS FREE II trial and signed the informed consent.  The informed consent was obtained prior to performance of any protocol-specific procedures for the subject.  A copy of the signed informed consent was given to the subject and a copy was placed in the subject's medical record.  Hedrick,Avalyn Molino W 09/28/2015, 11:45 AM

## 2015-10-03 ENCOUNTER — Other Ambulatory Visit: Payer: Self-pay | Admitting: Family Medicine

## 2015-10-03 NOTE — Telephone Encounter (Signed)
Electronic refill request. Last Filled:     45 tablet 0 08/09/2015  Last office visit:   09/22/15  Please advise.

## 2015-10-04 NOTE — Telephone Encounter (Signed)
Rx called to pharmacy as instructed. 

## 2015-10-10 ENCOUNTER — Encounter: Payer: Self-pay | Admitting: Family Medicine

## 2015-10-10 ENCOUNTER — Ambulatory Visit (INDEPENDENT_AMBULATORY_CARE_PROVIDER_SITE_OTHER): Payer: Medicare Other | Admitting: Family Medicine

## 2015-10-10 VITALS — BP 102/60 | HR 49 | Temp 97.8°F | Wt 153.0 lb

## 2015-10-10 DIAGNOSIS — I255 Ischemic cardiomyopathy: Secondary | ICD-10-CM

## 2015-10-10 DIAGNOSIS — R413 Other amnesia: Secondary | ICD-10-CM

## 2015-10-10 DIAGNOSIS — Y92009 Unspecified place in unspecified non-institutional (private) residence as the place of occurrence of the external cause: Principal | ICD-10-CM

## 2015-10-10 DIAGNOSIS — Y92099 Unspecified place in other non-institutional residence as the place of occurrence of the external cause: Secondary | ICD-10-CM

## 2015-10-10 DIAGNOSIS — W19XXXA Unspecified fall, initial encounter: Secondary | ICD-10-CM

## 2015-10-10 MED ORDER — HYDROCODONE-ACETAMINOPHEN 5-325 MG PO TABS: ORAL_TABLET | ORAL | 0 refills | Status: DC

## 2015-10-10 MED ORDER — METAXALONE 800 MG PO TABS: ORAL_TABLET | ORAL | 1 refills | Status: DC

## 2015-10-10 NOTE — Patient Instructions (Signed)
Shirlee Limerick will call about your referral. Go see her on the way out about the head CT.  Don't change your meds for now.  I'll check with cardiology.   Take care.  Glad to see you.

## 2015-10-10 NOTE — Progress Notes (Signed)
Recently admitted with ICD discharge, started on ranexa, CT head deferred in the meantime.  No more shocks in the meantime.  No CP.  Doesn't have cardiology f/u set up yet.  He quit smoking.    Recently he was reaching up, looking up to fix a flag at home and fell backward, onto a planter in the driveway.  No LOC. He didn't have syncopal event.  L shoulder is sore.  Lump on L forearm noted, with bruising locally.  L elbow and L hip sore.    He has noted that he has been getting occasionally lightheaded. Sometimes it happens when he looks upward, with moving his head backward and not just upward gaze from a fixed position.  It doesn't happen all the time. It sounds like he can occasionally get mildly lightheaded when he stands up, but this doesn't happen every time he stands up. He does not describe vertigo, room spinning sensation. No focal neurologic changes otherwise.  Meds, vitals, and allergies reviewed.   ROS: Per HPI unless specifically indicated in ROS section   GEN: nad, alert and oriented HEENT: mucous membranes moist, TM wnl B NECK: supple w/o LA CV: rrr.  PULM: ctab, no inc wob ABD: soft, +bs EXT: no edema SKIN: no acute rash, but bruising noted on the left forearm. He has a small nodule noted in the soft tissue on the left forearm. This may just be a deep bruise. Distally neurovascular intact on the hands. Normal grip strength in the hands bilaterally. Normal range of motion at the elbow and the left shoulder. No pain with internal or external rotation of left shoulder. His before meals joint is not tender. Neck and spine are not tender to palpation in the midline. He is not lightheaded on standing. He is not lightheaded with looking upward.

## 2015-10-10 NOTE — Progress Notes (Signed)
Pre visit review using our clinic review tool, if applicable. No additional management support is needed unless otherwise documented below in the visit note. 

## 2015-10-11 DIAGNOSIS — Y92009 Unspecified place in unspecified non-institutional (private) residence as the place of occurrence of the external cause: Secondary | ICD-10-CM

## 2015-10-11 DIAGNOSIS — W19XXXA Unspecified fall, initial encounter: Secondary | ICD-10-CM | POA: Insufficient documentation

## 2015-10-11 NOTE — Assessment & Plan Note (Signed)
He looks to have superficial bruising, should resolve. He doesn't need musculoskeletal imaging at this point. There is no likely fracture based on his exam, as he is not tender on bony prominences and can still bear weight. I did refill his pain medicine and muscle relaxers in the meantime to be used on a when necessary basis. It does not appear that either one of these medicines contributed to the fall. Discussed with patient and wife. They agree. I think the bigger issue is his lightheadedness. I will talk to cardiology about this. We did not change his medications at this point, otherwise.  At this point no chest pain. Not short of breath. No ICD shocks since the inpatient evaluation. Okay for outpatient follow-up. >25 minutes spent in face to face time with patient, >50% spent in counselling or coordination of care.

## 2015-10-11 NOTE — Assessment & Plan Note (Signed)
He still needs his head CT done. We'll get this set up. This is currently pending.

## 2015-10-13 ENCOUNTER — Ambulatory Visit (INDEPENDENT_AMBULATORY_CARE_PROVIDER_SITE_OTHER)
Admission: RE | Admit: 2015-10-13 | Discharge: 2015-10-13 | Disposition: A | Discharge status: Home / Self Care | Payer: Medicare Other | Source: Ambulatory Visit | Attending: Family Medicine | Admitting: Family Medicine

## 2015-10-13 DIAGNOSIS — R413 Other amnesia: Secondary | ICD-10-CM

## 2015-10-20 ENCOUNTER — Encounter: Payer: Self-pay | Admitting: Internal Medicine

## 2015-10-20 ENCOUNTER — Ambulatory Visit (INDEPENDENT_AMBULATORY_CARE_PROVIDER_SITE_OTHER): Payer: Medicare Other | Admitting: Internal Medicine

## 2015-10-20 VITALS — BP 122/66 | HR 53 | Ht 63.0 in | Wt 151.2 lb

## 2015-10-20 DIAGNOSIS — I472 Ventricular tachycardia, unspecified: Secondary | ICD-10-CM

## 2015-10-20 DIAGNOSIS — I255 Ischemic cardiomyopathy: Secondary | ICD-10-CM

## 2015-10-20 DIAGNOSIS — I4891 Unspecified atrial fibrillation: Secondary | ICD-10-CM

## 2015-10-20 NOTE — Progress Notes (Signed)
HPI [name] returns today for followup. He is a 70 year old man with an ischemic cardiomyopathy, chronic systolic heart failure, class III, chronic atrial fibrillation, previously unwilling to take any anticoagulation, ventricular tachycardia, status post ICD implantation. He was in the hospital several months ago with VT storm. He was placed on Eliquis as he converted back to NSR after being out of rhythm for over a year. The patient was placed on amiodarone to prevent VT. He has done well. He is still not interested in the Watchman procedure but he seems to be tolerating Eliquis very well. He denies sob or chest pain.  Since I saw him last, he feels better. His sinus rate and AV conduction have improved.   Allergies  Allergen Reactions  . Ace Inhibitors Other (See Comments)    muscle pain. Tolerates ARBs.   . Codeine Other (See Comments)    "head wants to explode."  . Penicillins Swelling    "started at point of injection; w/in 3 min my upper arm was swollen 3 times normal"  . Lisinopril     Muscle Pain  . Statins Other (See Comments)    Myalgias per patient     Current Outpatient Prescriptions  Medication Sig Dispense Refill  . amiodarone (PACERONE) 200 MG tablet Take 200 mg by mouth Mon-Fri and 400 mg on Sat and Sun 180 tablet 3  . apixaban (ELIQUIS) 5 MG TABS tablet Take 1 tablet (5 mg total) by mouth 2 (two) times daily. Resume 09/29/15 60 tablet 6  . aspirin 81 MG EC tablet Take 1 tablet (81 mg total) by mouth daily.    . benzonatate (TESSALON) 200 MG capsule TAKE 1 CAPSULE BY MOUTH 3 TIMES DAILY AS NEEDED FOR COUGH 30 capsule 1  . carvedilol (COREG) 3.125 MG tablet Take 3.125 mg by mouth 2 (two) times daily with a meal.    . diazepam (VALIUM) 5 MG tablet TAKE 1 TABLET BY MOUTH EVERY 6 HOURS AS NEEDED 45 tablet 0  . fexofenadine (ALLEGRA) 180 MG tablet Take 180 mg by mouth daily as needed for allergies or rhinitis.    . furosemide (LASIX) 20 MG tablet Take 1 tablet by mouth daily  as needed for fluid/ swelling    . HYDROcodone-acetaminophen (NORCO/VICODIN) 5-325 MG tablet TAKE 1 TABLET BY MOUTH EVERY 6 HOURS AS NEEDED FOR PAIN 30 tablet 0  . losartan (COZAAR) 25 MG tablet Take 1 tablet by mouth 2 (two) times daily.    . metaxalone (SKELAXIN) 800 MG tablet TAKE 1 TABLET BY MOUTH 3 TIMES A DAY AS NEEDED FOR PAIN. 90 tablet 1  . Multiple Vitamins-Minerals (MULTIVITAMIN ADULTS 50+) TABS Take 1 tablet by mouth daily.    [name]. NITROSTAT 0.4 MG SL tablet PLACE 1 TABLET (0.4 MG TOTAL) UNDER THE TONGUE EVERY 5 (FIVE) MINUTES AS NEEDED. FOR CHEST PAIN. 25 tablet 1  . promethazine (PHENERGAN) 25 MG tablet TAKE 1 TABLET BY MOUTH EVERY 6 HOURS AS NEEDED FOR NAUSEA/VOMITING 30 tablet 1  . ranitidine (ZANTAC) 150 MG capsule Take 150 mg by mouth daily as needed for heartburn.     . ranolazine (RANEXA) 500 MG 12 hr tablet Take 1 tablet (500 mg total) by mouth 2 (two) times daily. 60 tablet 1  . traMADol (ULTRAM) 50 MG tablet Take 50-100 mg by mouth every 8 (eight) hours as needed (pain).    [name]. zolpidem (AMBIEN CR) 12.5 MG CR tablet TAKE 1 TABLET BY MOUTH AT BEDTIME 30 tablet 5   No current  facility-administered medications for this visit.    Facility-Administered Medications Ordered in Other Visits  Medication Dose Route Frequency Provider Last Rate Last Dose  . sodium chloride 0.9 % injection 3 mL  3 mL Intravenous Q12H Marinus Maw, MD      . sodium chloride 0.9 % injection 3 mL  3 mL Intravenous PRN Marinus Maw, MD         Past Medical History:  Diagnosis Date  . Acute lower GI bleeding 12/11/2011   "first time" (12/11/2011)  . Acute myocardial infarction, unspecified site, episode of care unspecified 1995   Pt living in [place], no stent, ?PTCA  . Allergic rhinitis, cause unspecified   . Anxiety   . Arthritis    knees, back  . Atrial fibrillation (HCC)    a. 05/2015 - converted to sinus in setting of ICD shocks; placed on eliquis 5 bid.  [name] CAD (coronary artery disease),  autologous vein bypass graft   . Cervical herniated disc    told not to lift >10 lbs  . Chronic systolic CHF (congestive heart failure), NYHA class 2 (HCC)    Reports EF of 25%.   [name] COPD (chronic obstructive pulmonary disease) (HCC) 11/2012   by xray  . Diverticulosis    by CT scan  . HLD (hyperlipidemia)   . ICD (implantable cardiac defibrillator) in place   . Insomnia   . Paroxysmal ventricular tachycardia (HCC)   . Perennial allergic rhinitis    only to dust mites  . VT (ventricular tachycardia) (HCC)    a. 05/2015 - VT storm with multiple ICD shocks-->Amio 400 BID.    ROS:   All systems reviewed and negative except as noted in the HPI.   Past Surgical History:  Procedure Laterality Date  . CARDIAC CATHETERIZATION  2004   LAD 30%, D1 30%, CFX-AV groove 70-80%, OM1 30%, EF 20-25%  . CARDIAC CATHETERIZATION N/A 09/28/2015   Procedure: Left Heart Cath and Coronary Angiography;  Surgeon: Peter M Swaziland, MD;  Location: [facility] INVASIVE CV LAB;  Service: Cardiovascular;  Laterality: N/A;  . CARDIAC DEFIBRILLATOR PLACEMENT  2004  . CATARACT EXTRACTION Right 01/2012  . CHOLECYSTECTOMY  1/ 2012  . COLONOSCOPY  01/08/2012   Procedure: COLONOSCOPY;  Surgeon: Iva Boop, MD;  Location: [facility] ENDOSCOPY;  Service: Endoscopy;  Laterality: N/A;  . CORONARY ANGIOPLASTY  1995   Pt thinks he got a balloon, living in [place], [place]  . IMPLANTABLE CARDIOVERTER DEFIBRILLATOR GENERATOR CHANGE N/A 02/07/2012   Procedure: IMPLANTABLE CARDIOVERTER DEFIBRILLATOR GENERATOR CHANGE;  Surgeon: Marinus Maw, MD; Medtronic Evera XT VR single-chamber serial number [id number], Laterality: Left  . INSERT / REPLACE / REMOVE PACEMAKER  2004   Medtronic ICD  . PARTIAL KNEE ARTHROPLASTY  ~ 2000   left  . SHOULDER ARTHROSCOPY W/ ROTATOR CUFF REPAIR  twice   right (12/11/2011)  . TONSILLECTOMY AND ADENOIDECTOMY  ~ 1951     Family History  Problem Relation Age of Onset  . Diabetes Father   . Tracheal cancer Father  46    smoker  . Stroke Mother   . Cancer Sister     left eye  . CAD Neg Hx   . Colon cancer Neg Hx   . Prostate cancer Neg Hx      Social History   Social History  . Marital status: Married    Spouse name: N/A  . Number of children: 0  . Years of education: N/A   Occupational History  .  [employer] truck driver (retired)    Social History Main Topics  . Smoking status: Former Smoker    Packs/day: 0.25    Years: 50.00    Types: Cigarettes, Cigars    Quit date: 09/27/2015  . Smokeless tobacco: Never Used     Comment: ~ 8 cigarettes daily  . Alcohol use No  . Drug use: No  . Sexual activity: No   Other Topics Concern  . Not on file   Social History Narrative   Lives with wife, married 1998   Grown children, 2 great grandchildren   Occupation: retired, was Presenter, broadcasting   Activity: walking, fishing   Diet: good water daily, fruits/vegetables rare      Wife is Product manager.    [id number], Human resources officer. No known agent orange exposure.       BP 122/66   Pulse (!) 53   Ht 5\' 3"  (1.6 m)   Wt 151 lb 3.2 oz (68.6 kg)   BMI 26.78 kg/m   Physical Exam:  Chronically ill appearing 70 yo man, NAD HEENT: Unremarkable Neck:  6 cm JVD, no thyromegally Lungs:  Clear except for rales in the bases. No wheezes or rhonchi. Well-healed ICD incision. HEART:  Regular brady rhythm, no murmurs, no rubs, no clicks Abd:  soft, positive bowel sounds, no organomegally, no rebound, no guarding Ext:  2 plus pulses, no edema, no cyanosis, no clubbing Skin:  No rashes no nodules Neuro:  CN II through XII intact, motor grossly intact   DEVICE  Normal device function.  See PaceArt for details. Optivol is stable.  Assess/Plan:  1. VT - he has had only one more VT episode since his last visit. He will continue his dose of amiodarone with  400 mg daily on Saturday-Sunday and 200 mg daily Monday thru Friday 2. Chronic systolic heart failure - his symptoms are controlled. No change in meds. He is  improved. 3. ICD - his Medtronic device is working normally. Will recheck in several months.  4. Atrial fib - he is at risk for stroke. He is tolerating his Eliquis for thromboembolic prevention.  Leonia Reeves.D.

## 2015-10-20 NOTE — Patient Instructions (Signed)
Medication Instructions:  Your physician recommends that you continue on your current medications as directed. Please refer to the Current Medication list given to you today.   Labwork: none  Testing/Procedures: none  Follow-Up: Your physician wants you to follow-up in: 6 months with Dr. Taylor. You will receive a reminder letter in the mail two months in advance. If you don't receive a letter, please call our office to schedule the follow-up appointment.   Any Other Special Instructions Will Be Listed Below (If Applicable).     If you need a refill on your cardiac medications before your next appointment, please call your pharmacy.   

## 2015-10-21 ENCOUNTER — Other Ambulatory Visit: Payer: Self-pay | Admitting: Nurse Practitioner

## 2015-10-21 ENCOUNTER — Other Ambulatory Visit: Payer: Self-pay | Admitting: Family Medicine

## 2015-10-21 DIAGNOSIS — I472 Ventricular tachycardia, unspecified: Secondary | ICD-10-CM

## 2015-10-23 NOTE — Telephone Encounter (Signed)
Electronic refill request. Previously prescribed by another physician.  Please advise.

## 2015-10-24 NOTE — Telephone Encounter (Signed)
Rx called to pharmacy as instructed. 

## 2015-10-24 NOTE — Telephone Encounter (Signed)
Please call in.  Thanks.   

## 2015-10-30 ENCOUNTER — Telehealth: Payer: Self-pay | Admitting: Cardiology

## 2015-10-30 ENCOUNTER — Ambulatory Visit (INDEPENDENT_AMBULATORY_CARE_PROVIDER_SITE_OTHER): Payer: Medicare Other | Admitting: *Deleted

## 2015-10-30 DIAGNOSIS — I5022 Chronic systolic (congestive) heart failure: Secondary | ICD-10-CM

## 2015-10-30 DIAGNOSIS — I472 Ventricular tachycardia, unspecified: Secondary | ICD-10-CM

## 2015-10-30 DIAGNOSIS — I255 Ischemic cardiomyopathy: Secondary | ICD-10-CM

## 2015-10-30 NOTE — Progress Notes (Signed)
Remote ICD transmission.   

## 2015-10-30 NOTE — Telephone Encounter (Signed)
LMOVM reminding pt to send remote transmission.   

## 2015-11-01 ENCOUNTER — Encounter: Payer: Self-pay | Admitting: Cardiology

## 2015-11-01 ENCOUNTER — Encounter: Payer: Medicare Other | Admitting: Internal Medicine

## 2015-11-01 ENCOUNTER — Other Ambulatory Visit: Payer: Self-pay | Admitting: Family Medicine

## 2015-11-01 MED ORDER — DONEPEZIL HCL 10 MG PO TABS: 10.0000 mg | ORAL_TABLET | Freq: Every day | ORAL | 1 refills | Status: DC

## 2015-11-02 ENCOUNTER — Ambulatory Visit: Payer: Medicare Other | Admitting: Family Medicine

## 2015-11-03 ENCOUNTER — Other Ambulatory Visit: Payer: Self-pay | Admitting: Family Medicine

## 2015-11-06 ENCOUNTER — Ambulatory Visit (INDEPENDENT_AMBULATORY_CARE_PROVIDER_SITE_OTHER): Payer: Medicare Other | Admitting: Family Medicine

## 2015-11-06 ENCOUNTER — Inpatient Hospital Stay (HOSPITAL_COMMUNITY)
Admission: EM | Admit: 2015-11-06 | Discharge: 2015-11-08 | DRG: 190 | Disposition: A | Discharge status: Home / Self Care | Payer: Medicare Other | Attending: Internal Medicine | Admitting: Internal Medicine

## 2015-11-06 ENCOUNTER — Emergency Department (HOSPITAL_COMMUNITY): Payer: Medicare Other

## 2015-11-06 ENCOUNTER — Encounter: Payer: Self-pay | Admitting: Family Medicine

## 2015-11-06 ENCOUNTER — Encounter (HOSPITAL_COMMUNITY): Payer: Self-pay | Admitting: *Deleted

## 2015-11-06 VITALS — BP 116/74 | HR 76 | Temp 97.8°F | Wt 147.2 lb

## 2015-11-06 DIAGNOSIS — J90 Pleural effusion, not elsewhere classified: Secondary | ICD-10-CM | POA: Diagnosis not present

## 2015-11-06 DIAGNOSIS — I5022 Chronic systolic (congestive) heart failure: Secondary | ICD-10-CM | POA: Diagnosis present

## 2015-11-06 DIAGNOSIS — R0902 Hypoxemia: Secondary | ICD-10-CM

## 2015-11-06 DIAGNOSIS — Z7901 Long term (current) use of anticoagulants: Secondary | ICD-10-CM

## 2015-11-06 DIAGNOSIS — Y95 Nosocomial condition: Secondary | ICD-10-CM | POA: Diagnosis present

## 2015-11-06 DIAGNOSIS — Z95810 Presence of automatic (implantable) cardiac defibrillator: Secondary | ICD-10-CM | POA: Diagnosis present

## 2015-11-06 DIAGNOSIS — I255 Ischemic cardiomyopathy: Secondary | ICD-10-CM | POA: Diagnosis not present

## 2015-11-06 DIAGNOSIS — I2510 Atherosclerotic heart disease of native coronary artery without angina pectoris: Secondary | ICD-10-CM | POA: Diagnosis present

## 2015-11-06 DIAGNOSIS — Z801 Family history of malignant neoplasm of trachea, bronchus and lung: Secondary | ICD-10-CM

## 2015-11-06 DIAGNOSIS — J440 Chronic obstructive pulmonary disease with acute lower respiratory infection: Principal | ICD-10-CM | POA: Diagnosis present

## 2015-11-06 DIAGNOSIS — I110 Hypertensive heart disease with heart failure: Secondary | ICD-10-CM | POA: Diagnosis present

## 2015-11-06 DIAGNOSIS — Z9841 Cataract extraction status, right eye: Secondary | ICD-10-CM

## 2015-11-06 DIAGNOSIS — R112 Nausea with vomiting, unspecified: Secondary | ICD-10-CM

## 2015-11-06 DIAGNOSIS — E785 Hyperlipidemia, unspecified: Secondary | ICD-10-CM | POA: Diagnosis present

## 2015-11-06 DIAGNOSIS — I5023 Acute on chronic systolic (congestive) heart failure: Secondary | ICD-10-CM | POA: Diagnosis present

## 2015-11-06 DIAGNOSIS — Z7982 Long term (current) use of aspirin: Secondary | ICD-10-CM

## 2015-11-06 DIAGNOSIS — J984 Other disorders of lung: Secondary | ICD-10-CM | POA: Diagnosis present

## 2015-11-06 DIAGNOSIS — J9601 Acute respiratory failure with hypoxia: Secondary | ICD-10-CM | POA: Diagnosis not present

## 2015-11-06 DIAGNOSIS — Z823 Family history of stroke: Secondary | ICD-10-CM

## 2015-11-06 DIAGNOSIS — I4819 Other persistent atrial fibrillation: Secondary | ICD-10-CM | POA: Diagnosis present

## 2015-11-06 DIAGNOSIS — R300 Dysuria: Secondary | ICD-10-CM

## 2015-11-06 DIAGNOSIS — F419 Anxiety disorder, unspecified: Secondary | ICD-10-CM | POA: Diagnosis present

## 2015-11-06 DIAGNOSIS — J189 Pneumonia, unspecified organism: Secondary | ICD-10-CM | POA: Diagnosis present

## 2015-11-06 DIAGNOSIS — I252 Old myocardial infarction: Secondary | ICD-10-CM

## 2015-11-06 DIAGNOSIS — I4891 Unspecified atrial fibrillation: Secondary | ICD-10-CM | POA: Diagnosis present

## 2015-11-06 DIAGNOSIS — Z961 Presence of intraocular lens: Secondary | ICD-10-CM | POA: Diagnosis present

## 2015-11-06 DIAGNOSIS — Z833 Family history of diabetes mellitus: Secondary | ICD-10-CM

## 2015-11-06 DIAGNOSIS — Z951 Presence of aortocoronary bypass graft: Secondary | ICD-10-CM

## 2015-11-06 DIAGNOSIS — Z880 Allergy status to penicillin: Secondary | ICD-10-CM

## 2015-11-06 DIAGNOSIS — Z23 Encounter for immunization: Secondary | ICD-10-CM | POA: Diagnosis not present

## 2015-11-06 DIAGNOSIS — Z888 Allergy status to other drugs, medicaments and biological substances status: Secondary | ICD-10-CM

## 2015-11-06 DIAGNOSIS — K219 Gastro-esophageal reflux disease without esophagitis: Secondary | ICD-10-CM | POA: Diagnosis present

## 2015-11-06 DIAGNOSIS — I472 Ventricular tachycardia, unspecified: Secondary | ICD-10-CM

## 2015-11-06 DIAGNOSIS — R001 Bradycardia, unspecified: Secondary | ICD-10-CM | POA: Diagnosis present

## 2015-11-06 DIAGNOSIS — Z87891 Personal history of nicotine dependence: Secondary | ICD-10-CM

## 2015-11-06 DIAGNOSIS — T462X5A Adverse effect of other antidysrhythmic drugs, initial encounter: Secondary | ICD-10-CM

## 2015-11-06 DIAGNOSIS — Z885 Allergy status to narcotic agent status: Secondary | ICD-10-CM

## 2015-11-06 HISTORY — DX: Pneumonia, unspecified organism: J18.9

## 2015-11-06 HISTORY — DX: Presence of automatic (implantable) cardiac defibrillator: Z95.810

## 2015-11-06 HISTORY — DX: Essential (primary) hypertension: I10

## 2015-11-06 LAB — BASIC METABOLIC PANEL
ANION GAP: 9 (ref 5–15)
BUN: 16 mg/dL (ref 6–20)
CALCIUM: 8.8 mg/dL — AB (ref 8.9–10.3)
CO2: 25 mmol/L (ref 22–32)
Chloride: 109 mmol/L (ref 101–111)
Creatinine, Ser: 1.14 mg/dL (ref 0.61–1.24)
Glucose, Bld: 100 mg/dL — ABNORMAL HIGH (ref 65–99)
Potassium: 4.9 mmol/L (ref 3.5–5.1)
Sodium: 143 mmol/L (ref 135–145)

## 2015-11-06 LAB — CBC
HCT: 41.4 % (ref 39.0–52.0)
HEMOGLOBIN: 13 g/dL (ref 13.0–17.0)
MCH: 29.4 pg (ref 26.0–34.0)
MCHC: 31.4 g/dL (ref 30.0–36.0)
MCV: 93.7 fL (ref 78.0–100.0)
Platelets: 385 10*3/uL (ref 150–400)
RBC: 4.42 MIL/uL (ref 4.22–5.81)
RDW: 14.5 % (ref 11.5–15.5)
WBC: 9.2 10*3/uL (ref 4.0–10.5)

## 2015-11-06 LAB — POC URINALSYSI DIPSTICK (AUTOMATED)
Blood, UA: NEGATIVE
Glucose, UA: NEGATIVE
KETONES UA: NEGATIVE
LEUKOCYTES UA: NEGATIVE
Nitrite, UA: NEGATIVE
PROTEIN UA: NEGATIVE
Spec Grav, UA: >=1.03
Urobilinogen, UA: 0.2
pH, UA: 6

## 2015-11-06 LAB — I-STAT TROPONIN, ED: TROPONIN I, POC: 0 ng/mL (ref 0.00–0.08)

## 2015-11-06 MED ORDER — LOSARTAN POTASSIUM 25 MG PO TABS: 25.0000 mg | ORAL_TABLET | Freq: Two times a day (BID) | ORAL | 1 refills | Status: DC

## 2015-11-06 NOTE — Progress Notes (Signed)
Pre visit review using our clinic review tool, if applicable. No additional management support is needed unless otherwise documented below in the visit note. 

## 2015-11-06 NOTE — ED Triage Notes (Signed)
PT went to MD office today and was going to see him for dark urine.  Pt had some vomiting and diarrhea last nite.  Today they could not get a good oxygen saturation in the doctors office and sent here for concern of fluid on lungs or pneumonia.  Pt has a defibrillator

## 2015-11-06 NOTE — Progress Notes (Signed)
His lightheaded sensation is some better, but not fully resolved.  He wanted input from neuro.  D/w pt.  It may be reasonable to get input from cards first.  He doesn't have orthostatic sx like prev.    Pulse ox initially wouldn't register.  Up to 90% on RA on recheck, but that was rare- he was usually in the 80s max.  No SOB, no CP.  Baseline pulse ox had been 92-94% at other OVs.  His pulse ox came up to 96-98% with 2L O2 via [place].  He had some diffuse pallor on initial inspection, his color improved on O2.   He had some nausea and diarrhea after a really big meal yesterday.  No nausea now.  He vomited once last night/early this AM.   Burning with urination, just at the tip of the shaft.  Going on for about 1 week.    PMH and SH reviewed  ROS: Per HPI unless specifically indicated in ROS section   Meds, vitals, and allergies reviewed.   GEN: nad, alert and oriented HEENT: mucous membranes moist NECK: supple w/o LA CV: brady but regular.   PULM: ctab, no inc wob ABD: soft, +bs EXT: no edema Genital exam wnl, shaft wnl.  No irritation or discharge.

## 2015-11-06 NOTE — Assessment & Plan Note (Addendum)
The concern is for aspiration PNA given the recent vomiting.  He had pallor initially, improved on O2.  He had driven to clinic today.  We called his wife to come to clinic.  By her arrival, his color and pulse ox were better.  D/w pt and wife.  I encouraged EMS transfer, declined by patient.  Morbidity and mortality discussed.  He would only agree to car transport with wife to [facility] ER.  I called ahead, I talked with charge re: pending arrival and my concerns.  Will await ER eval.    We'll culture urine in the meantime, given his dysuria.    >40 minutes spent in face to face time with patient, >50% spent in counselling or coordination of care, with mult rechecks of patient at above and discussion of situation with patient and with wife.

## 2015-11-06 NOTE — Patient Instructions (Signed)
Go to the ER now.   Take care.  Glad to see you. 

## 2015-11-07 ENCOUNTER — Telehealth: Payer: Self-pay

## 2015-11-07 ENCOUNTER — Encounter (HOSPITAL_COMMUNITY): Payer: Self-pay | Admitting: General Practice

## 2015-11-07 DIAGNOSIS — E119 Type 2 diabetes mellitus without complications: Secondary | ICD-10-CM | POA: Diagnosis not present

## 2015-11-07 DIAGNOSIS — Z7901 Long term (current) use of anticoagulants: Secondary | ICD-10-CM | POA: Diagnosis not present

## 2015-11-07 DIAGNOSIS — Z880 Allergy status to penicillin: Secondary | ICD-10-CM | POA: Diagnosis not present

## 2015-11-07 DIAGNOSIS — F419 Anxiety disorder, unspecified: Secondary | ICD-10-CM | POA: Diagnosis present

## 2015-11-07 DIAGNOSIS — J440 Chronic obstructive pulmonary disease with acute lower respiratory infection: Secondary | ICD-10-CM | POA: Diagnosis present

## 2015-11-07 DIAGNOSIS — R001 Bradycardia, unspecified: Secondary | ICD-10-CM | POA: Diagnosis present

## 2015-11-07 DIAGNOSIS — I480 Paroxysmal atrial fibrillation: Secondary | ICD-10-CM | POA: Diagnosis not present

## 2015-11-07 DIAGNOSIS — Z823 Family history of stroke: Secondary | ICD-10-CM | POA: Diagnosis not present

## 2015-11-07 DIAGNOSIS — E785 Hyperlipidemia, unspecified: Secondary | ICD-10-CM | POA: Diagnosis present

## 2015-11-07 DIAGNOSIS — Z801 Family history of malignant neoplasm of trachea, bronchus and lung: Secondary | ICD-10-CM | POA: Diagnosis not present

## 2015-11-07 DIAGNOSIS — I252 Old myocardial infarction: Secondary | ICD-10-CM | POA: Diagnosis not present

## 2015-11-07 DIAGNOSIS — Z961 Presence of intraocular lens: Secondary | ICD-10-CM | POA: Diagnosis present

## 2015-11-07 DIAGNOSIS — Z9841 Cataract extraction status, right eye: Secondary | ICD-10-CM | POA: Diagnosis not present

## 2015-11-07 DIAGNOSIS — I110 Hypertensive heart disease with heart failure: Secondary | ICD-10-CM | POA: Diagnosis present

## 2015-11-07 DIAGNOSIS — I5022 Chronic systolic (congestive) heart failure: Secondary | ICD-10-CM | POA: Diagnosis not present

## 2015-11-07 DIAGNOSIS — Z95810 Presence of automatic (implantable) cardiac defibrillator: Secondary | ICD-10-CM | POA: Diagnosis not present

## 2015-11-07 DIAGNOSIS — J189 Pneumonia, unspecified organism: Secondary | ICD-10-CM | POA: Diagnosis not present

## 2015-11-07 DIAGNOSIS — Z885 Allergy status to narcotic agent status: Secondary | ICD-10-CM | POA: Diagnosis not present

## 2015-11-07 DIAGNOSIS — I4891 Unspecified atrial fibrillation: Secondary | ICD-10-CM | POA: Diagnosis present

## 2015-11-07 DIAGNOSIS — I482 Chronic atrial fibrillation: Secondary | ICD-10-CM | POA: Diagnosis not present

## 2015-11-07 DIAGNOSIS — J9601 Acute respiratory failure with hypoxia: Secondary | ICD-10-CM

## 2015-11-07 DIAGNOSIS — S82409A Unspecified fracture of shaft of unspecified fibula, initial encounter for closed fracture: Secondary | ICD-10-CM | POA: Diagnosis not present

## 2015-11-07 DIAGNOSIS — Z7982 Long term (current) use of aspirin: Secondary | ICD-10-CM | POA: Diagnosis not present

## 2015-11-07 DIAGNOSIS — I10 Essential (primary) hypertension: Secondary | ICD-10-CM | POA: Diagnosis not present

## 2015-11-07 DIAGNOSIS — Z87891 Personal history of nicotine dependence: Secondary | ICD-10-CM | POA: Diagnosis not present

## 2015-11-07 DIAGNOSIS — Y95 Nosocomial condition: Secondary | ICD-10-CM | POA: Diagnosis present

## 2015-11-07 DIAGNOSIS — Z833 Family history of diabetes mellitus: Secondary | ICD-10-CM | POA: Diagnosis not present

## 2015-11-07 DIAGNOSIS — Z888 Allergy status to other drugs, medicaments and biological substances status: Secondary | ICD-10-CM | POA: Diagnosis not present

## 2015-11-07 DIAGNOSIS — I255 Ischemic cardiomyopathy: Secondary | ICD-10-CM | POA: Diagnosis present

## 2015-11-07 DIAGNOSIS — I2510 Atherosclerotic heart disease of native coronary artery without angina pectoris: Secondary | ICD-10-CM | POA: Diagnosis present

## 2015-11-07 LAB — RESPIRATORY PANEL BY PCR
Adenovirus: NOT DETECTED
Bordetella pertussis: NOT DETECTED
CORONAVIRUS 229E-RVPPCR: NOT DETECTED
CORONAVIRUS OC43-RVPPCR: NOT DETECTED
Chlamydophila pneumoniae: NOT DETECTED
Coronavirus HKU1: NOT DETECTED
Coronavirus NL63: NOT DETECTED
INFLUENZA B-RVPPCR: NOT DETECTED
Influenza A: NOT DETECTED
MYCOPLASMA PNEUMONIAE-RVPPCR: NOT DETECTED
Metapneumovirus: NOT DETECTED
PARAINFLUENZA VIRUS 1-RVPPCR: NOT DETECTED
Parainfluenza Virus 2: NOT DETECTED
Parainfluenza Virus 3: NOT DETECTED
Parainfluenza Virus 4: NOT DETECTED
RESPIRATORY SYNCYTIAL VIRUS-RVPPCR: NOT DETECTED
Rhinovirus / Enterovirus: NOT DETECTED

## 2015-11-07 LAB — LACTIC ACID, PLASMA: LACTIC ACID, VENOUS: 1.8 mmol/L (ref 0.5–1.9)

## 2015-11-07 LAB — URINE CULTURE: ORGANISM ID, BACTERIA: NO GROWTH

## 2015-11-07 LAB — BRAIN NATRIURETIC PEPTIDE: B Natriuretic Peptide: 184.2 pg/mL — ABNORMAL HIGH (ref 0.0–100.0)

## 2015-11-07 LAB — HIV ANTIBODY (ROUTINE TESTING W REFLEX): HIV SCREEN 4TH GENERATION: NONREACTIVE

## 2015-11-07 MED ORDER — AMIODARONE HCL 200 MG PO TABS: 200.0000 mg | ORAL_TABLET | Freq: Every day | ORAL | Status: DC

## 2015-11-07 MED ORDER — ADULT MULTIVITAMIN W/MINERALS CH
1.0000 | ORAL_TABLET | Freq: Every day | ORAL | Status: DC
  Administered 2015-11-07 – 2015-11-08 (×2): 1 via ORAL
  Filled 2015-11-07 (×2): qty 1

## 2015-11-07 MED ORDER — ZOLPIDEM TARTRATE 5 MG PO TABS
5.0000 mg | ORAL_TABLET | Freq: Every evening | ORAL | Status: DC | PRN
  Administered 2015-11-08: 5 mg via ORAL
  Filled 2015-11-07: qty 1

## 2015-11-07 MED ORDER — AMIODARONE HCL 200 MG PO TABS
200.0000 mg | ORAL_TABLET | ORAL | Status: DC
  Administered 2015-11-07 – 2015-11-08 (×2): 200 mg via ORAL
  Filled 2015-11-07 (×3): qty 1

## 2015-11-07 MED ORDER — HYDROCODONE-ACETAMINOPHEN 5-325 MG PO TABS: 1.0000 | ORAL_TABLET | ORAL | Status: DC | PRN

## 2015-11-07 MED ORDER — VANCOMYCIN HCL IN DEXTROSE 750-5 MG/150ML-% IV SOLN
750.0000 mg | Freq: Two times a day (BID) | INTRAVENOUS | Status: DC
  Administered 2015-11-07 – 2015-11-08 (×2): 750 mg via INTRAVENOUS
  Filled 2015-11-07 (×4): qty 150

## 2015-11-07 MED ORDER — LOPERAMIDE HCL 2 MG PO CAPS
2.0000 mg | ORAL_CAPSULE | ORAL | Status: DC | PRN
  Administered 2015-11-07: 2 mg via ORAL
  Filled 2015-11-07: qty 1

## 2015-11-07 MED ORDER — FAMOTIDINE 20 MG PO TABS: 20.0000 mg | ORAL_TABLET | Freq: Every day | ORAL | Status: DC | PRN

## 2015-11-07 MED ORDER — CARVEDILOL 3.125 MG PO TABS
3.1250 mg | ORAL_TABLET | Freq: Two times a day (BID) | ORAL | Status: DC
  Administered 2015-11-08: 3.125 mg via ORAL
  Filled 2015-11-07 (×4): qty 1

## 2015-11-07 MED ORDER — LOSARTAN POTASSIUM 25 MG PO TABS
25.0000 mg | ORAL_TABLET | Freq: Two times a day (BID) | ORAL | Status: DC
  Administered 2015-11-07 – 2015-11-08 (×3): 25 mg via ORAL
  Filled 2015-11-07 (×4): qty 1

## 2015-11-07 MED ORDER — METAXALONE 800 MG PO TABS
800.0000 mg | ORAL_TABLET | Freq: Three times a day (TID) | ORAL | Status: DC
  Administered 2015-11-07 – 2015-11-08 (×4): 800 mg via ORAL
  Filled 2015-11-07 (×6): qty 1

## 2015-11-07 MED ORDER — DONEPEZIL HCL 10 MG PO TABS
10.0000 mg | ORAL_TABLET | Freq: Every day | ORAL | Status: DC
  Administered 2015-11-07 (×2): 10 mg via ORAL
  Filled 2015-11-07: qty 1
  Filled 2015-11-07: qty 2

## 2015-11-07 MED ORDER — DEXTROSE 5 % IV SOLN
1.0000 g | Freq: Three times a day (TID) | INTRAVENOUS | Status: DC
  Administered 2015-11-07 – 2015-11-08 (×3): 1 g via INTRAVENOUS
  Filled 2015-11-07 (×8): qty 1

## 2015-11-07 MED ORDER — LORATADINE 10 MG PO TABS
10.0000 mg | ORAL_TABLET | Freq: Every day | ORAL | Status: DC
  Administered 2015-11-07 – 2015-11-08 (×2): 10 mg via ORAL
  Filled 2015-11-07 (×2): qty 1

## 2015-11-07 MED ORDER — APIXABAN 5 MG PO TABS
5.0000 mg | ORAL_TABLET | Freq: Two times a day (BID) | ORAL | Status: DC
  Administered 2015-11-07 – 2015-11-08 (×3): 5 mg via ORAL
  Filled 2015-11-07 (×5): qty 1

## 2015-11-07 MED ORDER — ALBUTEROL SULFATE (2.5 MG/3ML) 0.083% IN NEBU: 2.5000 mg | INHALATION_SOLUTION | Freq: Four times a day (QID) | RESPIRATORY_TRACT | Status: DC | PRN

## 2015-11-07 MED ORDER — DIAZEPAM 5 MG PO TABS: 5.0000 mg | ORAL_TABLET | Freq: Four times a day (QID) | ORAL | Status: DC | PRN

## 2015-11-07 MED ORDER — RANOLAZINE ER 500 MG PO TB12
500.0000 mg | ORAL_TABLET | Freq: Two times a day (BID) | ORAL | Status: DC
  Administered 2015-11-07 – 2015-11-08 (×3): 500 mg via ORAL
  Filled 2015-11-07 (×5): qty 1

## 2015-11-07 MED ORDER — VANCOMYCIN HCL IN DEXTROSE 1-5 GM/200ML-% IV SOLN
1000.0000 mg | Freq: Once | INTRAVENOUS | Status: AC
  Administered 2015-11-07: 1000 mg via INTRAVENOUS
  Filled 2015-11-07: qty 200

## 2015-11-07 MED ORDER — PROMETHAZINE HCL 25 MG PO TABS: 25.0000 mg | ORAL_TABLET | Freq: Four times a day (QID) | ORAL | Status: DC | PRN

## 2015-11-07 MED ORDER — AMIODARONE HCL 200 MG PO TABS: 400.0000 mg | ORAL_TABLET | ORAL | Status: DC

## 2015-11-07 MED ORDER — BENZONATATE 100 MG PO CAPS: 200.0000 mg | ORAL_CAPSULE | ORAL | Status: DC | PRN

## 2015-11-07 MED ORDER — NITROGLYCERIN 0.4 MG SL SUBL: 0.4000 mg | SUBLINGUAL_TABLET | SUBLINGUAL | Status: DC | PRN

## 2015-11-07 MED ORDER — ASPIRIN EC 81 MG PO TBEC
81.0000 mg | DELAYED_RELEASE_TABLET | Freq: Every day | ORAL | Status: DC
  Administered 2015-11-07 – 2015-11-08 (×2): 81 mg via ORAL
  Filled 2015-11-07 (×3): qty 1

## 2015-11-07 MED ORDER — LOPERAMIDE HCL 2 MG PO CAPS
4.0000 mg | ORAL_CAPSULE | Freq: Once | ORAL | Status: AC
  Administered 2015-11-07: 4 mg via ORAL
  Filled 2015-11-07: qty 2

## 2015-11-07 MED ORDER — AZTREONAM 2 G IJ SOLR
2.0000 g | Freq: Once | INTRAMUSCULAR | Status: AC
  Administered 2015-11-07: 2 g via INTRAVENOUS
  Filled 2015-11-07: qty 2

## 2015-11-07 MED ORDER — INFLUENZA VAC SPLIT QUAD 0.5 ML IM SUSY
0.5000 mL | PREFILLED_SYRINGE | INTRAMUSCULAR | Status: DC
  Filled 2015-11-07: qty 0.5

## 2015-11-07 MED ORDER — AZTREONAM 2 G IJ SOLR: 2.0000 g | Freq: Three times a day (TID) | INTRAMUSCULAR | Status: DC

## 2015-11-07 NOTE — H&P (Signed)
History and Physical    [name] ZOX:[id number] DOB: [birth year] DOA: 11/06/2015  Referring MD/NP/PA:   PCP: Crawford Givens, MD   Patient coming from:  The patient is coming from home.  At baseline, pt is independent for most of ADL.  Chief Complaint: Cough and shortness of breath  HPI: [name] is a 70 y.o. male with medical history significant of COPD, GERD, anxiety, bradycardia, s/p of AICD, sCHF (no 2D echo on record), A fib on Eliquis, IGB, CAD, s/p of CABG, who presents with cough and shortness of breath.  Patient states that he has been having dry cough, shortness of breath, generalized weakness for about 2 weeks. No fever or chills. He has pain over bilateral lower rib cage, which is mild, 1 out of 10 in severity, nonradiating, not aggravated or alleviated by any factors. He also have runny nose, but no sore throat. Patient states that he has chronic diarrhea after he had cholecystectomy. His diarrhea has not changed. He takes Imodium for his diarrhea. He has nausea and vomited once yesterday, but no nausea vomiting today. Patient denies symptoms of UTI, unilateral weakness. Patient states that he has been taking Eliquis for atrial fibrillation, and did not miss any dose. ,  ED Course: pt was found to have WBC 9.2, lactate 1.8, negative troponin, BNP 184.8, negative urinalysis, temperature normal, bradycardia, oxygen saturation 87% on room air, creatinine 1.14, as x-ray showed left lower lobe infiltration.  Review of Systems:   General: no fevers, chills, no changes in body weight, has poor appetite, has fatigue HEENT: no blurry vision, hearing changes or sore throat Respiratory: has dyspnea, coughing, no wheezing CV: has pain over bilateral lower rib cage. no palpitations.  GI: had nausea, vomiting. has diarrheha. No abdominal pain, constipation GU: no dysuria, burning on urination, increased urinary frequency, hematuria  Ext: no leg edema Neuro: no unilateral  weakness, numbness, or tingling, no vision change or hearing loss Skin: no rash, no skin tear. MSK: No muscle spasm, no deformity, no limitation of range of movement in spin Heme: No easy bruising.  Travel history: No recent long distant travel.  Allergy:  Allergies  Allergen Reactions  . Ace Inhibitors Other (See Comments)    muscle pain. Tolerates ARBs.   . Codeine Other (See Comments)    "head wants to explode."  . Penicillins Swelling    "started at point of injection; w/in 3 min my upper arm was swollen 3 times normal"  . Lisinopril     Muscle Pain  . Statins Other (See Comments)    Myalgias per patient    Past Medical History:  Diagnosis Date  . Acute lower GI bleeding 12/11/2011   "first time" (12/11/2011)  . Acute myocardial infarction, unspecified site, episode of care unspecified 1995   Pt living in [place], no stent, ?PTCA  . Allergic rhinitis, cause unspecified   . Anxiety   . Arthritis    knees, back  . Atrial fibrillation (HCC)    a. 05/2015 - converted to sinus in setting of ICD shocks; placed on eliquis 5 bid.  [name] CAD (coronary artery disease), autologous vein bypass graft   . Cervical herniated disc    told not to lift >10 lbs  . Chronic systolic CHF (congestive heart failure), NYHA class 2 (HCC)    Reports EF of 25%.   [name] COPD (chronic obstructive pulmonary disease) (HCC) 11/2012   by xray  . Diverticulosis    by CT scan  .  HLD (hyperlipidemia)   . ICD (implantable cardiac defibrillator) in place   . Insomnia   . Paroxysmal ventricular tachycardia (HCC)   . Perennial allergic rhinitis    only to dust mites  . VT (ventricular tachycardia) (HCC)    a. 05/2015 - VT storm with multiple ICD shocks-->Amio 400 BID.    Past Surgical History:  Procedure Laterality Date  . CARDIAC CATHETERIZATION  2004   LAD 30%, D1 30%, CFX-AV groove 70-80%, OM1 30%, EF 20-25%  . CARDIAC CATHETERIZATION N/A 09/28/2015   Procedure: Left Heart Cath and Coronary Angiography;   Surgeon: Peter M Swaziland, MD;  Location: [facility] INVASIVE CV LAB;  Service: Cardiovascular;  Laterality: N/A;  . CARDIAC DEFIBRILLATOR PLACEMENT  2004  . CATARACT EXTRACTION Right 01/2012  . CHOLECYSTECTOMY  1/ 2012  . COLONOSCOPY  01/08/2012   Procedure: COLONOSCOPY;  Surgeon: Iva Boop, MD;  Location: [facility] ENDOSCOPY;  Service: Endoscopy;  Laterality: N/A;  . CORONARY ANGIOPLASTY  1995   Pt thinks he got a balloon, living in [place], [place]  . IMPLANTABLE CARDIOVERTER DEFIBRILLATOR GENERATOR CHANGE N/A 02/07/2012   Procedure: IMPLANTABLE CARDIOVERTER DEFIBRILLATOR GENERATOR CHANGE;  Surgeon: Marinus Maw, MD; Medtronic Evera XT VR single-chamber serial number [id number], Laterality: Left  . INSERT / REPLACE / REMOVE PACEMAKER  2004   Medtronic ICD  . PARTIAL KNEE ARTHROPLASTY  ~ 2000   left  . SHOULDER ARTHROSCOPY W/ ROTATOR CUFF REPAIR  twice   right (12/11/2011)  . TONSILLECTOMY AND ADENOIDECTOMY  ~ 57    Social History:  reports that he quit smoking about 5 weeks ago. His smoking use included Cigarettes and Cigars. He has a 12.50 pack-year smoking history. He has never used smokeless tobacco. He reports that he does not drink alcohol or use drugs.  Family History:  Family History  Problem Relation Age of Onset  . Diabetes Father   . Tracheal cancer Father 50    smoker  . Stroke Mother   . Cancer Sister     left eye  . CAD Neg Hx   . Colon cancer Neg Hx   . Prostate cancer Neg Hx      Prior to Admission medications   Medication Sig Start Date End Date Taking? Authorizing Provider  amiodarone (PACERONE) 200 MG tablet Take 200 mg by mouth Mon-Fri and 400 mg on Sat and Sun 07/25/15  Yes Marinus Maw, MD  apixaban (ELIQUIS) 5 MG TABS tablet Take 1 tablet (5 mg total) by mouth 2 (two) times daily. Resume 09/29/15 09/28/15  Yes Amber Caryl Bis, NP  aspirin 81 MG EC tablet Take 1 tablet (81 mg total) by mouth daily. 05/28/15  Yes Ok Anis, NP  benzonatate (TESSALON) 200 MG  capsule TAKE 1 CAPSULE BY MOUTH 3 TIMES DAILY AS NEEDED FOR COUGH 10/31/14  Yes Joaquim Nam, MD  carvedilol (COREG) 3.125 MG tablet Take 3.125 mg by mouth 2 (two) times daily with a meal.   Yes Historical Provider, MD  diazepam (VALIUM) 5 MG tablet TAKE 1 TABLET BY MOUTH EVERY 6 HOURS AS NEEDED Patient taking differently: TAKE 1 TABLET BY MOUTH EVERY 6 HOURS AS NEEDED FOR ANXIETY 10/04/15  Yes Joaquim Nam, MD  donepezil (ARICEPT) 10 MG tablet Take 1 tablet (10 mg total) by mouth at bedtime. 11/01/15  Yes Joaquim Nam, MD  fexofenadine (ALLEGRA) 180 MG tablet Take 180 mg by mouth daily as needed for allergies or rhinitis.   Yes Historical Provider, MD  furosemide (LASIX) 20 MG tablet Take 1 tablet by mouth daily as needed for fluid/ swelling   Yes Historical Provider, MD  HYDROcodone-acetaminophen (NORCO/VICODIN) 5-325 MG tablet TAKE 1 TABLET BY MOUTH EVERY 6 HOURS AS NEEDED FOR PAIN 10/10/15  Yes Joaquim NamGraham S Duncan, MD  losartan (COZAAR) 25 MG tablet Take 1 tablet (25 mg total) by mouth 2 (two) times daily. 11/06/15  Yes Joaquim NamGraham S Duncan, MD  metaxalone (SKELAXIN) 800 MG tablet TAKE 1 TABLET BY MOUTH 3 TIMES A DAY AS NEEDED FOR PAIN. 10/10/15  Yes Joaquim NamGraham S Duncan, MD  Multiple Vitamins-Minerals (MULTIVITAMIN ADULTS 50+) TABS Take 1 tablet by mouth daily.   Yes Historical Provider, MD  NITROSTAT 0.4 MG SL tablet PLACE 1 TABLET (0.4 MG TOTAL) UNDER THE TONGUE EVERY 5 (FIVE) MINUTES AS NEEDED. FOR CHEST PAIN. 06/11/15  Yes Joaquim NamGraham S Duncan, MD  promethazine (PHENERGAN) 25 MG tablet TAKE 1 TABLET BY MOUTH EVERY 6 HOURS AS NEEDED FOR NAUSEA/VOMITING 02/22/15  Yes Joaquim NamGraham S Duncan, MD  ranitidine (ZANTAC) 150 MG capsule Take 150 mg by mouth daily as needed for heartburn.    Yes Historical Provider, MD  ranolazine (RANEXA) 500 MG 12 hr tablet Take 1 tablet (500 mg total) by mouth 2 (two) times daily. 09/28/15  Yes Amber Caryl BisK Seiler, NP  traMADol (ULTRAM) 50 MG tablet TAKE 1 TO 2 TABLETS BY MOUTH EVERY 8 HOURS AS  NEEDED Patient taking differently: TAKE 1 TO 2 TABLETS BY MOUTH EVERY 8 HOURS AS NEEDED FOR PAIN. 10/24/15  Yes Joaquim NamGraham S Duncan, MD  zolpidem (AMBIEN CR) 12.5 MG CR tablet TAKE 1 TABLET BY MOUTH AT BEDTIME Patient taking differently: TAKE 1 TABLET BY MOUTH AT BEDTIME AS NEEDED FOR SLEEP 07/19/15  Yes Joaquim NamGraham S Duncan, MD    Physical Exam: Vitals:   11/07/15 0815 11/07/15 0845 11/07/15 0900 11/07/15 0915  BP: (!) 109/48 120/69 118/66 129/68  Pulse: (!) 49 (!) 47 (!) 46 (!) 47  Resp: 21 21 19 17   Temp:      TempSrc:      SpO2: 93% 94% 93% 93%   General: Not in acute distress HEENT:       Eyes: PERRL, EOMI, no scleral icterus.       ENT: No discharge from the ears and nose, no pharynx injection, no tonsillar enlargement.        Neck: No JVD, no bruit, no mass felt. Heme: No neck lymph node enlargement. Cardiac: S1/S2, RRR, No murmurs, No gallops or rubs. Respiratory: No rales, wheezing, rhonchi or rubs. GI: Soft, nondistended, nontender, no rebound pain, no organomegaly, BS present. GU: No hematuria Ext: No pitting leg edema bilaterally. 2+DP/PT pulse bilaterally. Musculoskeletal: No joint deformities, No joint redness or warmth, no limitation of ROM in spin. Skin: No rashes.  Neuro: Alert, oriented X3, cranial nerves II-XII grossly intact, moves all extremities normally.  Psych: Patient is not psychotic, no suicidal or hemocidal ideation.  Labs on Admission: I have personally reviewed following labs and imaging studies  CBC:  Recent Labs Lab 11/06/15 1919  WBC 9.2  HGB 13.0  HCT 41.4  MCV 93.7  PLT 385   Basic Metabolic Panel:  Recent Labs Lab 11/06/15 1919  NA 143  K 4.9  CL 109  CO2 25  GLUCOSE 100*  BUN 16  CREATININE 1.14  CALCIUM 8.8*   GFR: Estimated Creatinine Clearance: 47.8 mL/min (by C-G formula based on SCr of 1.14 mg/dL). Liver Function Tests: No results for input(s): AST, ALT, ALKPHOS,  BILITOT, PROT, ALBUMIN in the last 168 hours. No results for  input(s): LIPASE, AMYLASE in the last 168 hours. No results for input(s): AMMONIA in the last 168 hours. Coagulation Profile: No results for input(s): INR, PROTIME in the last 168 hours. Cardiac Enzymes: No results for input(s): CKTOTAL, CKMB, CKMBINDEX, TROPONINI in the last 168 hours. BNP (last 3 results) No results for input(s): PROBNP in the last 8760 hours. HbA1C: No results for input(s): HGBA1C in the last 72 hours. CBG: No results for input(s): GLUCAP in the last 168 hours. Lipid Profile: No results for input(s): CHOL, HDL, LDLCALC, TRIG, CHOLHDL, LDLDIRECT in the last 72 hours. Thyroid Function Tests: No results for input(s): TSH, T4TOTAL, FREET4, T3FREE, THYROIDAB in the last 72 hours. Anemia Panel: No results for input(s): VITAMINB12, FOLATE, FERRITIN, TIBC, IRON, RETICCTPCT in the last 72 hours. Urine analysis:    Component Value Date/Time   LABSPEC >=1.030 01/25/2010 0921   PHURINE 5.0 01/25/2010 0921   HGBUR moderate 01/25/2010 0921   BILIRUBINUR 1+ 11/06/2015 1605   PROTEINUR Neg 11/06/2015 1605   UROBILINOGEN 0.2 11/06/2015 1605   UROBILINOGEN 0.2 01/25/2010 0921   NITRITE Neg 11/06/2015 1605   NITRITE negative 01/25/2010 0921   LEUKOCYTESUR Negative 11/06/2015 1605   Sepsis Labs: @LABRCNTIP (procalcitonin:4,lacticidven:4) )No results found for this or any previous visit (from the past 240 hour(s)).   Radiological Exams on Admission: Dg Chest 2 View  Result Date: 11/06/2015 CLINICAL DATA:  Hypoxia, vomiting and diarrhea last night EXAM: CHEST  2 VIEW COMPARISON:  09/27/2015 and dating back through 11/12/2012 FINDINGS: The heart is enlarged. AICD device projects over the left hemithorax with single lead projecting in the expected location of the right ventricle. Left lower lobe airspace disease suspicious for pneumonia is noted with small loculated posterior pleural effusion. No acute osseous abnormality. Cholecystectomy clips present in the right upper quadrant.  IMPRESSION: New left lower lobe infiltrate with small posterior loculated pleural effusion. Electronically Signed   By: Tollie Eth M.D.   On: 11/06/2015 20:00     EKG: Independently reviewed.  Sinus rhythm, QTC 513, QRS widening which is old, poor R-wave progression, RAD  Assessment/Plan Principal Problem:   HCAP (healthcare-associated pneumonia) Active Problems:   Coronary atherosclerosis   COPD (chronic obstructive pulmonary disease) (HCC)   Automatic implantable cardioverter-defibrillator in situ   Chronic systolic CHF (congestive heart failure) (HCC)   Anxiety   Bradycardia   A-fib (HCC)   Acute respiratory failure with hypoxia (HCC)   Acute respiratory failure with hypoxia (HCC) due to possible HCAP: Patient has shortness of breath, cough plus chest x-ray findings, indicating possible HCAP. However patient does not have leukocytosis and fever, not completely consistent with HCAP. Another potential differential diagnosis is pulmonary embolism, but the patient is taking Eliquis for a fib, making PE less likely. Pt is not septic. Lactic acid 1.8, hemodynamically stable. Will treat pt presumably has HCAP now.  - Will place on telemetry bed for obs. - IV Vancomycin and Aztreonam were started in ED, will continue - Tesslon for cough  - prn Albuterol Nebs for SOB - Urine legionella and S. pneumococcal antigen - Follow up blood culture x2, sputum culture and respiratory virus panel  Coronary atherosclerosis: s/p of CABG, no CP -Continue aspirin, Coreg, Ranexa and prn NTG  COPD (chronic obstructive pulmonary disease) (HCC): stable -prn albuterol nebs  Chronic systolic CHF (congestive heart failure) (HCC): No 2-D echo on record. No leg edema or JVD. CHF is compensated -Continue Coreg, cozarr and  aspirin  GERD: -Pepcid   Atrial Fibrillation: CHA2DS2-VASc Score is 3, needs oral anticoagulation. Patient is on Eliquis at home. Heart rate is well controlled. -continue Eliquis, coreg  and amiodarone  Anxiety:  -Continue home medications:  Valium    DVT ppx: On Eliquis Code Status: Full code Family Communication: None at bed side. Disposition Plan:  Anticipate discharge back to previous home environment Consults called:  none Admission status: Obs / tele   Date of Service 11/07/2015    Lorretta Harp [facility] Pager [phone]  If 7PM-7AM, please contact night-coverage [email or web address] Password [email or web address] 11/07/2015, 9:58 AM

## 2015-11-07 NOTE — ED Notes (Signed)
Pt requesting something for his nose, states hes having a lot of drainage. Respiratory called to place humidifier on nasal cannula

## 2015-11-07 NOTE — ED Notes (Signed)
A Heart Healthy diet ordered for lunch.

## 2015-11-07 NOTE — ED Provider Notes (Signed)
[facility]-EMERGENCY DEPT Provider Note   CSN: [id number] Arrival date & time: 11/06/15  1739     History   Chief Complaint Chief Complaint  Patient presents with  . Cough    rib pain    HPI [name] is a 70 y.o. male.  Patient with history of CAD, atrial fibrillation, pacer/defibrillator, coagulopathy secondary to Eliquis, COPD, ischemic cardiomyopathy presents from PCP office with concern for cough and hypoxia noted while at office earlier today. The patient denies SOB or DOE, chest pain or fever. He has been feeling weak, no energy, eating less for several days. He describes his cough as minimal and nonproductive. He reports bilateral lower lateral rib discomfort with deep breath or movement. No abdominal pain. He woke last night with one episode nausea and vomiting. Emesis was NBNB.    The history is provided by the patient and the spouse. No language interpreter was used.  Cough  Pertinent negatives include no chest pain, no chills, no myalgias and no shortness of breath.    Past Medical History:  Diagnosis Date  . Acute lower GI bleeding 12/11/2011   "first time" (12/11/2011)  . Acute myocardial infarction, unspecified site, episode of care unspecified 1995   Pt living in [place], no stent, ?PTCA  . AICD (automatic cardioverter/defibrillator) present   . Allergic rhinitis, cause unspecified   . Anxiety   . Arthritis    "all over" (11/07/2015)  . Atrial fibrillation (HCC)    a. 05/2015 - converted to sinus in setting of ICD shocks; placed on eliquis 5 bid.  [name] CAD (coronary artery disease), autologous vein bypass graft   . Cervical herniated disc    told not to lift >10 lbs  . Chronic systolic CHF (congestive heart failure), NYHA class 2 (HCC)    Reports EF of 25%.   [name] COPD (chronic obstructive pulmonary disease) (HCC) 11/2012   by xray  . Diverticulosis    by CT scan  . HCAP (healthcare-associated pneumonia) 11/06/2015  . Hypertension   . Insomnia   . Paroxysmal  ventricular tachycardia (HCC)   . Perennial allergic rhinitis    only to dust mites  . Pneumonia 2000s   "walking pneumonia"  . VT (ventricular tachycardia) (HCC)    a. 05/2015 - VT storm with multiple ICD shocks-->Amio 400 BID.    Patient Active Problem List   Diagnosis Date Noted  . HCAP (healthcare-associated pneumonia) 11/07/2015  . Acute respiratory failure with hypoxia (HCC) 11/07/2015  . Hypoxia 11/06/2015  . Fall at home 10/11/2015  . Ventricular tachycardia (HCC) 09/27/2015  . Memory loss 09/24/2015  . Encounter for screening examination for infectious disease 09/24/2015  . A-fib (HCC) 05/28/2015  . VT (ventricular tachycardia) (HCC) 05/25/2015  . Ventricular fibrillation (HCC) 05/25/2015  . VF (ventricular fibrillation) (HCC) 05/25/2015  . Syncope and collapse 05/12/2015  . Hypotension 05/12/2015  . Bradycardia 05/12/2015  . Acute non-recurrent maxillary sinusitis 03/23/2015  . Nausea with vomiting 01/05/2015  . Advance care planning 09/16/2014  . Muscle ache 09/16/2014  . Back pain 09/08/2013  . Abdominal pain, chronic, epigastric 09/08/2013  . Fatigue 06/11/2013  . Chest wall mass 11/05/2012  . Medicare annual wellness visit, subsequent 06/26/2012  . Arthritis   . Insomnia   . Anxiety   . Diverticulosis 12/11/2011  . Tobacco abuse 03/06/2011  . Ischemic cardiomyopathy 02/12/2011  . Chronic systolic CHF (congestive heart failure) (HCC) 05/10/2010  . Automatic implantable cardioverter-defibrillator in situ 08/23/2009  . Coronary atherosclerosis 10/31/2008  .  Hyperlipidemia 08/25/2008  . ALLERGIC RHINITIS 08/25/2008  . COPD (chronic obstructive pulmonary disease) (HCC) 08/25/2008    Past Surgical History:  Procedure Laterality Date  . CARDIAC CATHETERIZATION  2004   LAD 30%, D1 30%, CFX-AV groove 70-80%, OM1 30%, EF 20-25%  . CARDIAC CATHETERIZATION N/A 09/28/2015   Procedure: Left Heart Cath and Coronary Angiography;  Surgeon: Peter M Swaziland, MD;   Location: [facility] INVASIVE CV LAB;  Service: Cardiovascular;  Laterality: N/A;  . CARDIAC DEFIBRILLATOR PLACEMENT  2004  . CATARACT EXTRACTION W/ INTRAOCULAR LENS IMPLANT Right 01/2012  . COLONOSCOPY  01/08/2012   Procedure: COLONOSCOPY;  Surgeon: Iva Boop, MD;  Location: [facility] ENDOSCOPY;  Service: Endoscopy;  Laterality: N/A;  . CORONARY ANGIOPLASTY  1995   Pt thinks he got a balloon, living in [place], [place]  . IMPLANTABLE CARDIOVERTER DEFIBRILLATOR GENERATOR CHANGE N/A 02/07/2012   Procedure: IMPLANTABLE CARDIOVERTER DEFIBRILLATOR GENERATOR CHANGE;  Surgeon: Marinus Maw, MD; Medtronic Evera XT VR single-chamber serial number [id number], Laterality: Left  . INSERT / REPLACE / REMOVE PACEMAKER  2004   Medtronic ICD  . KNEE ARTHROSCOPY Left 05/2003   Hattie Perch 06/19/2010  . LAPAROSCOPIC CHOLECYSTECTOMY  1/ 2012  . SHOULDER ARTHROSCOPY W/ ROTATOR CUFF REPAIR Right twice  . TONSILLECTOMY AND ADENOIDECTOMY  ~ 1951       Home Medications    Prior to Admission medications   Medication Sig Start Date End Date Taking? Authorizing Provider  amiodarone (PACERONE) 200 MG tablet Take 200 mg by mouth Mon-Fri and 400 mg on Sat and Sun 07/25/15  Yes Marinus Maw, MD  apixaban (ELIQUIS) 5 MG TABS tablet Take 1 tablet (5 mg total) by mouth 2 (two) times daily. Resume 09/29/15 09/28/15  Yes Amber Caryl Bis, NP  aspirin 81 MG EC tablet Take 1 tablet (81 mg total) by mouth daily. 05/28/15  Yes Ok Anis, NP  benzonatate (TESSALON) 200 MG capsule TAKE 1 CAPSULE BY MOUTH 3 TIMES DAILY AS NEEDED FOR COUGH 10/31/14  Yes Joaquim Nam, MD  carvedilol (COREG) 3.125 MG tablet Take 3.125 mg by mouth 2 (two) times daily with a meal.   Yes Historical Provider, MD  diazepam (VALIUM) 5 MG tablet TAKE 1 TABLET BY MOUTH EVERY 6 HOURS AS NEEDED Patient taking differently: TAKE 1 TABLET BY MOUTH EVERY 6 HOURS AS NEEDED FOR ANXIETY 10/04/15  Yes Joaquim Nam, MD  donepezil (ARICEPT) 10 MG tablet Take 1 tablet (10 mg  total) by mouth at bedtime. 11/01/15  Yes Joaquim Nam, MD  fexofenadine (ALLEGRA) 180 MG tablet Take 180 mg by mouth daily as needed for allergies or rhinitis.   Yes Historical Provider, MD  furosemide (LASIX) 20 MG tablet Take 1 tablet by mouth daily as needed for fluid/ swelling   Yes Historical Provider, MD  HYDROcodone-acetaminophen (NORCO/VICODIN) 5-325 MG tablet TAKE 1 TABLET BY MOUTH EVERY 6 HOURS AS NEEDED FOR PAIN 10/10/15  Yes Joaquim Nam, MD  losartan (COZAAR) 25 MG tablet Take 1 tablet (25 mg total) by mouth 2 (two) times daily. 11/06/15  Yes Joaquim Nam, MD  metaxalone (SKELAXIN) 800 MG tablet TAKE 1 TABLET BY MOUTH 3 TIMES A DAY AS NEEDED FOR PAIN. 10/10/15  Yes Joaquim Nam, MD  Multiple Vitamins-Minerals (MULTIVITAMIN ADULTS 50+) TABS Take 1 tablet by mouth daily.   Yes Historical Provider, MD  NITROSTAT 0.4 MG SL tablet PLACE 1 TABLET (0.4 MG TOTAL) UNDER THE TONGUE EVERY 5 (FIVE) MINUTES AS NEEDED. FOR CHEST  PAIN. 06/11/15  Yes Joaquim NamGraham S Duncan, MD  promethazine (PHENERGAN) 25 MG tablet TAKE 1 TABLET BY MOUTH EVERY 6 HOURS AS NEEDED FOR NAUSEA/VOMITING 02/22/15  Yes Joaquim NamGraham S Duncan, MD  ranitidine (ZANTAC) 150 MG capsule Take 150 mg by mouth daily as needed for heartburn.    Yes Historical Provider, MD  ranolazine (RANEXA) 500 MG 12 hr tablet Take 1 tablet (500 mg total) by mouth 2 (two) times daily. 09/28/15  Yes Amber Caryl BisK Seiler, NP  traMADol (ULTRAM) 50 MG tablet TAKE 1 TO 2 TABLETS BY MOUTH EVERY 8 HOURS AS NEEDED Patient taking differently: TAKE 1 TO 2 TABLETS BY MOUTH EVERY 8 HOURS AS NEEDED FOR PAIN. 10/24/15  Yes Joaquim NamGraham S Duncan, MD  zolpidem (AMBIEN CR) 12.5 MG CR tablet TAKE 1 TABLET BY MOUTH AT BEDTIME Patient taking differently: TAKE 1 TABLET BY MOUTH AT BEDTIME AS NEEDED FOR SLEEP 07/19/15  Yes Joaquim NamGraham S Duncan, MD    Family History Family History  Problem Relation Age of Onset  . Diabetes Father   . Tracheal cancer Father [id number]    smoker  . Stroke Mother   . Cancer  Sister     left eye  . CAD Neg Hx   . Colon cancer Neg Hx   . Prostate cancer Neg Hx     Social History Social History  Substance Use Topics  . Smoking status: Former Smoker    Packs/day: 0.50    Years: 50.00    Types: Cigarettes, Cigars    Quit date: 09/27/2015  . Smokeless tobacco: Never Used  . Alcohol use No     Allergies   Ace inhibitors; Codeine; Penicillins; Lisinopril; and Statins   Review of Systems Review of Systems  Constitutional: Positive for appetite change and fatigue. Negative for chills and fever.  HENT: Negative.   Respiratory: Positive for cough. Negative for shortness of breath.   Cardiovascular: Negative.  Negative for chest pain and leg swelling.  Gastrointestinal: Positive for nausea and vomiting. Negative for abdominal pain.  Genitourinary: Negative.   Musculoskeletal: Negative.  Negative for myalgias.  Skin: Negative.   Neurological: Positive for weakness.     Physical Exam Updated Vital Signs BP 119/63 (BP Location: Left Arm)   Pulse (!) 54   Temp 97.5 F (36.4 C) (Oral)   Resp 16   SpO2 93%   Physical Exam  Constitutional: He is oriented to person, place, and time. He appears well-developed and well-nourished.  HENT:  Head: Normocephalic.  Neck: Normal range of motion. Neck supple.  Cardiovascular: Normal rate and regular rhythm.   Pulmonary/Chest: Breath sounds normal. No respiratory distress. He has no wheezes. He has no rales. He exhibits no tenderness.  Dyspnea with long sentences, NAD.  Abdominal: Soft. Bowel sounds are normal. There is no tenderness. There is no rebound and no guarding.  Musculoskeletal: Normal range of motion. He exhibits no edema.  Neurological: He is alert and oriented to person, place, and time.  Skin: Skin is warm and dry. No rash noted.  Psychiatric: He has a normal mood and affect.     ED Treatments / Results  Labs (all labs ordered are listed, but only abnormal results are displayed) Labs  Reviewed  BASIC METABOLIC PANEL - Abnormal; Notable for the following:       Result Value   Glucose, Bld 100 (*)    Calcium 8.8 (*)    All other components within normal limits  BRAIN NATRIURETIC PEPTIDE - Abnormal; Notable for the  following:    B Natriuretic Peptide 184.2 (*)    All other components within normal limits  RESPIRATORY PANEL BY PCR  CULTURE, BLOOD (ROUTINE X 2)  CULTURE, BLOOD (ROUTINE X 2)  CULTURE, EXPECTORATED SPUTUM-ASSESSMENT  GRAM STAIN  CBC  LACTIC ACID, PLASMA  HIV ANTIBODY (ROUTINE TESTING)  STREP PNEUMONIAE URINARY ANTIGEN  LEGIONELLA PNEUMOPHILA SEROGP 1 UR AG  BASIC METABOLIC PANEL  CBC  I-STAT TROPOININ, ED   Results for orders placed or performed during the hospital encounter of 11/06/15  Respiratory Panel by PCR  Result Value Ref Range   Adenovirus NOT DETECTED NOT DETECTED   Coronavirus 229E NOT DETECTED NOT DETECTED   Coronavirus HKU1 NOT DETECTED NOT DETECTED   Coronavirus NL63 NOT DETECTED NOT DETECTED   Coronavirus OC43 NOT DETECTED NOT DETECTED   Metapneumovirus NOT DETECTED NOT DETECTED   Rhinovirus / Enterovirus NOT DETECTED NOT DETECTED   Influenza A NOT DETECTED NOT DETECTED   Influenza B NOT DETECTED NOT DETECTED   Parainfluenza Virus 1 NOT DETECTED NOT DETECTED   Parainfluenza Virus 2 NOT DETECTED NOT DETECTED   Parainfluenza Virus 3 NOT DETECTED NOT DETECTED   Parainfluenza Virus 4 NOT DETECTED NOT DETECTED   Respiratory Syncytial Virus NOT DETECTED NOT DETECTED   Bordetella pertussis NOT DETECTED NOT DETECTED   Chlamydophila pneumoniae NOT DETECTED NOT DETECTED   Mycoplasma pneumoniae NOT DETECTED NOT DETECTED  Basic metabolic panel  Result Value Ref Range   Sodium 143 135 - 145 mmol/L   Potassium 4.9 3.5 - 5.1 mmol/L   Chloride 109 101 - 111 mmol/L   CO2 25 22 - 32 mmol/L   Glucose, Bld 100 (H) 65 - 99 mg/dL   BUN 16 6 - 20 mg/dL   Creatinine, Ser 2.45 0.61 - 1.24 mg/dL   Calcium 8.8 (L) 8.9 - 10.3 mg/dL   GFR calc non  Af Amer >60 >60 mL/min   GFR calc Af Amer >60 >60 mL/min   Anion gap 9 5 - 15  CBC  Result Value Ref Range   WBC 9.2 4.0 - 10.5 K/uL   RBC 4.42 4.22 - 5.81 MIL/uL   Hemoglobin 13.0 13.0 - 17.0 g/dL   HCT 80.9 98.3 - 38.2 %   MCV 93.7 78.0 - 100.0 fL   MCH 29.4 26.0 - 34.0 pg   MCHC 31.4 30.0 - 36.0 g/dL   RDW 50.5 39.7 - 67.3 %   Platelets 385 150 - 400 K/uL  Brain natriuretic peptide  Result Value Ref Range   B Natriuretic Peptide 184.2 (H) 0.0 - 100.0 pg/mL  Lactic acid, plasma  Result Value Ref Range   Lactic Acid, Venous 1.8 0.5 - 1.9 mmol/L  HIV antibody  Result Value Ref Range   HIV Screen 4th Generation wRfx Non Reactive Non Reactive  I-stat troponin, ED  Result Value Ref Range   Troponin i, poc 0.00 0.00 - 0.08 ng/mL   Comment 3             EKG   Radiology Dg Chest 2 View  Result Date: 11/06/2015 CLINICAL DATA:  Hypoxia, vomiting and diarrhea last night EXAM: CHEST  2 VIEW COMPARISON:  09/27/2015 and dating back through 11/12/2012 FINDINGS: The heart is enlarged. AICD device projects over the left hemithorax with single lead projecting in the expected location of the right ventricle. Left lower lobe airspace disease suspicious for pneumonia is noted with small loculated posterior pleural effusion. No acute osseous abnormality. Cholecystectomy clips present in the right  upper quadrant. IMPRESSION: New left lower lobe infiltrate with small posterior loculated pleural effusion. Electronically Signed   By: Tollie Eth M.D.   On: 11/06/2015 20:00    Procedures Procedures (including critical care time)  Medications Ordered in ED Medications  losartan (COZAAR) tablet 25 mg (25 mg Oral Given 11/07/15 2154)  donepezil (ARICEPT) tablet 10 mg (10 mg Oral Given 11/07/15 2154)  HYDROcodone-acetaminophen (NORCO/VICODIN) 5-325 MG per tablet 1 tablet (not administered)  multivitamin with minerals tablet 1 tablet (1 tablet Oral Given 11/07/15 1005)  aspirin EC tablet 81 mg (81 mg  Oral Given 11/07/15 1154)  carvedilol (COREG) tablet 3.125 mg (3.125 mg Oral Not Given 11/07/15 1743)  apixaban (ELIQUIS) tablet 5 mg (5 mg Oral Given 11/07/15 2154)  ranolazine (RANEXA) 12 hr tablet 500 mg (500 mg Oral Given 11/07/15 2154)  diazepam (VALIUM) tablet 5 mg (not administered)  metaxalone (SKELAXIN) tablet 800 mg (800 mg Oral Given 11/07/15 2154)  zolpidem (AMBIEN) tablet 5 mg (5 mg Oral Given 11/08/15 0145)  nitroGLYCERIN (NITROSTAT) SL tablet 0.4 mg (not administered)  promethazine (PHENERGAN) tablet 25 mg (not administered)  benzonatate (TESSALON) capsule 200 mg (not administered)  loratadine (CLARITIN) tablet 10 mg (10 mg Oral Given 11/07/15 1006)  famotidine (PEPCID) tablet 20 mg (not administered)  loperamide (IMODIUM) capsule 2 mg (2 mg Oral Given 11/07/15 1442)  albuterol (PROVENTIL) (2.5 MG/3ML) 0.083% nebulizer solution 2.5 mg (not administered)  aztreonam (AZACTAM) 1 g in dextrose 5 % 50 mL IVPB (1 g Intravenous New Bag/Given 11/07/15 1744)  vancomycin (VANCOCIN) IVPB 750 mg/150 ml premix (750 mg Intravenous Given 11/08/15 0134)  amiodarone (PACERONE) tablet 200 mg (200 mg Oral Given 11/07/15 1154)    And  amiodarone (PACERONE) tablet 400 mg (not administered)  Influenza vac split quadrivalent PF (FLUARIX) injection 0.5 mL (not administered)  vancomycin (VANCOCIN) IVPB 1000 mg/200 mL premix (0 mg Intravenous Stopped 11/07/15 0405)  aztreonam (AZACTAM) 2 g in dextrose 5 % 50 mL IVPB (0 g Intravenous Stopped 11/07/15 0235)  loperamide (IMODIUM) capsule 4 mg (4 mg Oral Given 11/07/15 0328)     Initial Impression / Assessment and Plan / ED Course  I have reviewed the triage vital signs and the nursing notes.  Pertinent labs & imaging results that were available during my care of the patient were reviewed by me and considered in my medical decision making (see chart for details).  Clinical Course    70 yo Patient with mild cough, found to be hypoxic while at his physician's  office earlier today and sent for evaluation. He has a CXR positive for pneumonia, bilateral rib discomfort, and O2 saturation here observed in the upper 80's (88-89%), fluctuates to 93-94%.   Recent hospitalization in August. Will start HCAP abx (Vanc and Aztreonam). Hospitalist paged for admission.  Final Clinical Impressions(s) / ED Diagnoses   Final diagnoses:  HCAP (healthcare-associated pneumonia)  1. HCAP  New Prescriptions Current Discharge Medication List       Elpidio Anis, PA-C 11/08/15 0222    Derwood Kaplan, MD 11/08/15 [phone]

## 2015-11-07 NOTE — Progress Notes (Signed)
RT added humidity to nasal cannula O2 per RN request.

## 2015-11-07 NOTE — ED Notes (Signed)
RN walked into room to find that vanc had not infused; now infusing

## 2015-11-07 NOTE — ED Notes (Signed)
Pt had a bowel movement and stated that he needs to take 2 imodium post a bowel movement per DR orders to prevent an accident.

## 2015-11-07 NOTE — ED Notes (Signed)
Patient has a bedside toilet at bedside.

## 2015-11-07 NOTE — Progress Notes (Signed)
Pharmacy Antibiotic Note  [name] is a 70 y.o. male admitted on 11/06/2015 with SOB/cough, possible PNA.  Pharmacy has been consulted for Vancomycin  Dosing.  Vancomycin 1 g IV given in ED at 0100  Plan: Vancomycin 750 mg IV q12h    Temp (24hrs), Avg:97.9 F (36.6 C), Min:97.6 F (36.4 C), Max:98.2 F (36.8 C)   Recent Labs Lab 11/06/15 1919  WBC 9.2  CREATININE 1.14    Estimated Creatinine Clearance: 47.8 mL/min (by C-G formula based on SCr of 1.14 mg/dL).    Allergies  Allergen Reactions  . Ace Inhibitors Other (See Comments)    muscle pain. Tolerates ARBs.   . Codeine Other (See Comments)    "head wants to explode."  . Penicillins Swelling    "started at point of injection; w/in 3 min my upper arm was swollen 3 times normal"  . Lisinopril     Muscle Pain  . Statins Other (See Comments)    Myalgias per patient     Eddie Candle 11/07/2015 2:30 AM

## 2015-11-07 NOTE — ED Notes (Signed)
Ordered breakfast tray at [employer]

## 2015-11-07 NOTE — Telephone Encounter (Signed)
PLEASE NOTE: All timestamps contained within this report are represented as [place] Standard Time. CONFIDENTIALTY NOTICE: This fax transmission is intended only for the addressee. It contains information that is legally privileged, confidential or otherwise protected from use or disclosure. If you are not the intended recipient, you are strictly prohibited from reviewing, disclosing, copying using or disseminating any of this information or taking any action in reliance on or regarding this information. If you have received this fax in error, please notify us immediately by telephone so that we can arrange for its return to [place]. Phone: [phone], Toll-Free: [phone], Fax: [phone] Page: 1 of 1 Call Id: [id number] [facility] Night - Client Nonclinical Telephone Record [facility] Call Center Client [facility] Night - Client Client Site [facility] - Night Physician Raechel Ache - MD Contact Type Call Who Is Calling Patient / Member / Family / Caregiver Caller Name Seward Grater Phone Number [phone] Patient Name [name] Call Type Message Only Information Provided Reason for Call Request for General Office Information Initial Comment Caller was returning a call from the doctors office. Additional Comment Call Closed By: Verlin Grills Transaction Date/Time: 11/06/2015 5:40:25 PM (ET)

## 2015-11-07 NOTE — ED Notes (Signed)
Report attempted 

## 2015-11-07 NOTE — Progress Notes (Signed)
PROGRESS NOTE  Randy MinRichard L [name] MVH:[id number] DOB: September 14, 1945 DOA: 11/06/2015 PCP: Crawford GivensGraham Duncan, MD  Hospital Course/Subjective: 70 y.o. male with medical history significant of COPD, GERD, anxiety, bradycardia, s/p of AICD, sCHF (no 2D echo on record), A fib on Eliquis, IGB, CAD, s/p of CABG, who presents with cough and shortness of breath. He was diagnosed with potential HCAP and started on IV Vancomycin and Aztreonam due to PCN allergy.   This AM seen in ER with wife at bedside. He feels well, feeling more energetic. No cough, no chest pain or fevers. On 2L [place], not on O2 at home.   Assessment/Plan: Principal Problem:   HCAP (healthcare-associated pneumonia) Active Problems:   Coronary atherosclerosis   COPD (chronic obstructive pulmonary disease) (HCC)   Automatic implantable cardioverter-defibrillator in situ   Chronic systolic CHF (congestive heart failure) (HCC)   Anxiety   Bradycardia   A-fib (HCC)   Acute respiratory failure with hypoxia (HCC)   Acute respiratory failure with hypoxia (HCC) due to possible HCAP: Patient has shortness of breath, cough plus chest x-ray findings, indicating possible HCAP. However patient does not have leukocytosis and fever, not completely consistent with HCAP. Another potential differential diagnosis is pulmonary embolism, but the patient is taking Eliquis for a fib, making PE less likely. Pt is not septic. Lactic acid 1.8, hemodynamically stable. Will treat pt presumably has HCAP now. - telemetry bed for obs. - IV Vancomycin and Aztreonam  - Tesslon for cough  - prn Albuterol Nebs for SOB - Urine legionella and S. pneumococcal antigen pending - Follow up blood culture x2, sputum culture and respiratory virus panel  Coronary atherosclerosis: s/p of CABG, no CP -Continue aspirin, Coreg, Ranexa and prn NTG  COPD (chronic obstructive pulmonary disease) (HCC): stable -prn albuterol nebs  Chronic systolic CHF (congestive heart failure)  (HCC): No 2-D echo on record. No leg edema or JVD. CHF is compensated -Continue Coreg, cozarr and aspirin  GERD: -Pepcid   Atrial Fibrillation: CHA2DS2-VASc Score is 3, needs oral anticoagulation. Patient is on Eliquis at home. Heart rate is well controlled. -continue Eliquis, coreg and amiodarone  Anxiety:  -Continue home medications:  Valium  DVT ppx: On Eliquis Code Status: Full code Family Communication: Wife at bedside in ER.  Disposition Plan:  May be ready for home 10/4 if off O2.  Consults called:  none Admission status: Obs / tele   Antimicrobials:  Aztreonam/Vanc started 10/3    Objective: Vitals:   11/07/15 1045 11/07/15 1145 11/07/15 1200 11/07/15 1245  BP: 121/65 113/69 124/68 120/60  Pulse: (!) 53 (!) 51 (!) 51 (!) 49  Resp: 15 15 12 16   Temp:      TempSrc:      SpO2: 94% 95% 95% 95%    Intake/Output Summary (Last 24 hours) at 11/07/15 1313 Last data filed at 11/07/15 1303  Gross per 24 hour  Intake              300 ml  Output                0 ml  Net              300 ml   There were no vitals filed for this visit.   Exam: General:  Alert, oriented, calm, in no acute distress. On 2L [place]. Eyes: EOMI, clear sclerea Neck: supple, no masses, trachea mildline  Cardiovascular: RRR, no murmurs or rubs, no peripheral edema  Respiratory: clear to auscultation bilaterally, no wheezes,  no crackles  Abdomen: soft, nontender, nondistended, normal bowel tones heard  Skin: dry, no rashes  Musculoskeletal: no joint effusions, normal range of motion  Psychiatric: appropriate affect, normal speech  Neurologic: extraocular muscles intact, clear speech, moving all extremities with intact sensorium    Data Reviewed: CBC:  Recent Labs Lab 11/06/15 1919  WBC 9.2  HGB 13.0  HCT 41.4  MCV 93.7  PLT 385   Basic Metabolic Panel:  Recent Labs Lab 11/06/15 1919  NA 143  K 4.9  CL 109  CO2 25  GLUCOSE 100*  BUN 16  CREATININE 1.14  CALCIUM 8.8*     GFR: Estimated Creatinine Clearance: 47.8 mL/[name] (by C-G formula based on SCr of 1.14 mg/dL). Liver Function Tests: No results for input(s): AST, ALT, ALKPHOS, BILITOT, PROT, ALBUMIN in the last 168 hours. No results for input(s): LIPASE, AMYLASE in the last 168 hours. No results for input(s): AMMONIA in the last 168 hours. Coagulation Profile: No results for input(s): INR, PROTIME in the last 168 hours. Cardiac Enzymes: No results for input(s): CKTOTAL, CKMB, CKMBINDEX, TROPONINI in the last 168 hours. BNP (last 3 results) No results for input(s): PROBNP in the last 8760 hours. HbA1C: No results for input(s): HGBA1C in the last 72 hours. CBG: No results for input(s): GLUCAP in the last 168 hours. Lipid Profile: No results for input(s): CHOL, HDL, LDLCALC, TRIG, CHOLHDL, LDLDIRECT in the last 72 hours. Thyroid Function Tests: No results for input(s): TSH, T4TOTAL, FREET4, T3FREE, THYROIDAB in the last 72 hours. Anemia Panel: No results for input(s): VITAMINB12, FOLATE, FERRITIN, TIBC, IRON, RETICCTPCT in the last 72 hours. Urine analysis:    Component Value Date/Time   LABSPEC >=1.030 01/25/2010 0921   PHURINE 5.0 01/25/2010 0921   HGBUR moderate 01/25/2010 0921   BILIRUBINUR 1+ 11/06/2015 1605   PROTEINUR Neg 11/06/2015 1605   UROBILINOGEN 0.2 11/06/2015 1605   UROBILINOGEN 0.2 01/25/2010 0921   NITRITE Neg 11/06/2015 1605   NITRITE negative 01/25/2010 0921   LEUKOCYTESUR Negative 11/06/2015 1605   Sepsis Labs: @LABRCNTIP (procalcitonin:4,lacticidven:4)  ) Recent Results (from the past 240 hour(s))  Respiratory Panel by PCR     Status: None   Collection Time: 11/07/15  3:14 AM  Result Value Ref Range Status   Adenovirus NOT DETECTED NOT DETECTED Final   Coronavirus 229E NOT DETECTED NOT DETECTED Final   Coronavirus HKU1 NOT DETECTED NOT DETECTED Final   Coronavirus NL63 NOT DETECTED NOT DETECTED Final   Coronavirus OC43 NOT DETECTED NOT DETECTED Final    Metapneumovirus NOT DETECTED NOT DETECTED Final   Rhinovirus / Enterovirus NOT DETECTED NOT DETECTED Final   Influenza A NOT DETECTED NOT DETECTED Final   Influenza B NOT DETECTED NOT DETECTED Final   Parainfluenza Virus 1 NOT DETECTED NOT DETECTED Final   Parainfluenza Virus 2 NOT DETECTED NOT DETECTED Final   Parainfluenza Virus 3 NOT DETECTED NOT DETECTED Final   Parainfluenza Virus 4 NOT DETECTED NOT DETECTED Final   Respiratory Syncytial Virus NOT DETECTED NOT DETECTED Final   Bordetella pertussis NOT DETECTED NOT DETECTED Final   Chlamydophila pneumoniae NOT DETECTED NOT DETECTED Final   Mycoplasma pneumoniae NOT DETECTED NOT DETECTED Final     Studies: Dg Chest 2 View  Result Date: 11/06/2015 CLINICAL DATA:  Hypoxia, vomiting and diarrhea last night EXAM: CHEST  2 VIEW COMPARISON:  09/27/2015 and dating back through 11/12/2012 FINDINGS: The heart is enlarged. AICD device projects over the left hemithorax with single lead projecting in the expected location of  the right ventricle. Left lower lobe airspace disease suspicious for pneumonia is noted with small loculated posterior pleural effusion. No acute osseous abnormality. Cholecystectomy clips present in the right upper quadrant. IMPRESSION: New left lower lobe infiltrate with small posterior loculated pleural effusion. Electronically Signed   By: Tollie Eth M.D.   On: 11/06/2015 20:00    Scheduled Meds: . amiodarone  200 mg Oral Once per day on Mon Tue Wed Thu Fri   And  . [START ON 11/11/2015] amiodarone  400 mg Oral Once per day on Sun Sat  . apixaban  5 mg Oral BID  . aspirin EC  81 mg Oral Daily  . carvedilol  3.125 mg Oral BID WC  . donepezil  10 mg Oral QHS  . loratadine  10 mg Oral Daily  . losartan  25 mg Oral BID  . metaxalone  800 mg Oral TID  . multivitamin with minerals  1 tablet Oral Daily  . ranolazine  500 mg Oral BID  . vancomycin  750 mg Intravenous Q12H    Continuous Infusions: . aztreonam Stopped  (11/07/15 1303)     LOS: 0 days   Time spent: 14 minutes  Perry Molla Vergie Living, MD [facility] Pager [phone]  If 7PM-7AM, please contact night-coverage [email or web address] Password [email or web address] 11/07/2015, 1:13 PM

## 2015-11-07 NOTE — ED Notes (Signed)
Pt eating lunch

## 2015-11-08 DIAGNOSIS — Z95810 Presence of automatic (implantable) cardiac defibrillator: Secondary | ICD-10-CM

## 2015-11-08 DIAGNOSIS — I480 Paroxysmal atrial fibrillation: Secondary | ICD-10-CM

## 2015-11-08 DIAGNOSIS — I472 Ventricular tachycardia: Secondary | ICD-10-CM

## 2015-11-08 DIAGNOSIS — R001 Bradycardia, unspecified: Secondary | ICD-10-CM

## 2015-11-08 DIAGNOSIS — J449 Chronic obstructive pulmonary disease, unspecified: Secondary | ICD-10-CM

## 2015-11-08 LAB — BASIC METABOLIC PANEL
Anion gap: 4 — ABNORMAL LOW (ref 5–15)
BUN: 12 mg/dL (ref 6–20)
CALCIUM: 8.1 mg/dL — AB (ref 8.9–10.3)
CO2: 28 mmol/L (ref 22–32)
CREATININE: 1.05 mg/dL (ref 0.61–1.24)
Chloride: 105 mmol/L (ref 101–111)
GFR calc non Af Amer: >60 mL/min (ref >60)
Glucose, Bld: 120 mg/dL — ABNORMAL HIGH (ref 65–99)
Potassium: 4.1 mmol/L (ref 3.5–5.1)
SODIUM: 137 mmol/L (ref 135–145)

## 2015-11-08 LAB — CBC
HCT: 37.6 % — ABNORMAL LOW (ref 39.0–52.0)
Hemoglobin: 11.6 g/dL — ABNORMAL LOW (ref 13.0–17.0)
MCH: 28.5 pg (ref 26.0–34.0)
MCHC: 30.9 g/dL (ref 30.0–36.0)
MCV: 92.4 fL (ref 78.0–100.0)
PLATELETS: 311 10*3/uL (ref 150–400)
RBC: 4.07 MIL/uL — AB (ref 4.22–5.81)
RDW: 14.6 % (ref 11.5–15.5)
WBC: 8.1 10*3/uL (ref 4.0–10.5)

## 2015-11-08 MED ORDER — DOXYCYCLINE HYCLATE 100 MG PO TABS: 100.0000 mg | ORAL_TABLET | Freq: Two times a day (BID) | ORAL | 0 refills | Status: DC

## 2015-11-08 MED ORDER — LEVOFLOXACIN 750 MG PO TABS
750.0000 mg | ORAL_TABLET | Freq: Every day | ORAL | Status: DC
  Administered 2015-11-08: 750 mg via ORAL
  Filled 2015-11-08: qty 1

## 2015-11-08 MED ORDER — LEVOFLOXACIN 750 MG PO TABS: 750.0000 mg | ORAL_TABLET | Freq: Every day | ORAL | 0 refills | Status: DC

## 2015-11-08 MED ORDER — DOXYCYCLINE HYCLATE 100 MG PO TABS: 100.0000 mg | ORAL_TABLET | Freq: Two times a day (BID) | ORAL | Status: DC

## 2015-11-08 NOTE — Discharge Summary (Signed)
Physician Discharge Summary  [name] TSV:[id number] DOB: April 28, 1945 DOA: 11/06/2015  PCP: Crawford Givens, MD  Admit date: 11/06/2015 Discharge date: 11/08/2015  Admitted From: home  Disposition:  home   Recommendations for Outpatient Follow-up:  1. F/u CXR in 2-3 wks  Home Health:  none  Equipment/Devices:  none    Discharge Condition:  stable   CODE STATUS:  Full code   Diet recommendation:  Heart healthy diet Consultations:  none    Discharge Diagnoses:  Principal Problem:   HCAP (healthcare-associated pneumonia) Active Problems:   Acute respiratory failure with hypoxia (HCC)   Coronary atherosclerosis   COPD (chronic obstructive pulmonary disease) (HCC)   Automatic implantable cardioverter-defibrillator in situ   Chronic systolic CHF (congestive heart failure) (HCC)   Ischemic cardiomyopathy   Anxiety   Bradycardia   A-fib (HCC)   Ventricular tachycardia (HCC)   AICD (automatic cardioverter/defibrillator) present    Subjective: Dyspnea is improving- he was able to walk down the hall yesterday without dyspnea. Not much cough. No chest pain or fevers  Brief Summary: 70 y.o.malewith medical history significant of COPD, GERD, anxiety, bradycardia, s/p of AICD, sCHF (no 2D echo on record), A fib on Eliquis, IGB, CAD, s/p of CABG, whopresents with cough and shortness of breath. He was diagnosed with potential HCAP (recently discharged on 8/24) and started on IV Vancomycin and Aztreonam due to PCN allergy.   Hospital Course:  Acute respiratory failure with hypoxia (HCC) due to possibleHCAP - dyspnea, cough and CXR finding of LLL infiltrate point towards possible HCAP as the etiology of his symptoms which have improved with antibiotics - RSV panel negative - pulse ox 91 % on exertion- no home O2 needed - will change IV Vanc and Azactam to Doxycycline today - he has a PCN allergy (swelling)- he also had an episode of V tach causing his AICD to fire about 1 month  ago- he was admitted for this and no specific cause was found - due to V-tach, will not prescribe Levaquin or Zithromax - he will complete a 7 day course of Doxycycline   AICD for V tach - was admitted last month due to AICD firing-  no cause found- cont Amiodarone   Coronary atherosclerosis:s/p of CABG, no CP -Continue aspirin, Coreg, Ranexa and prn NTG  COPD (chronic obstructive pulmonary disease) (HCC):stable -prn albuterol nebs  Chronic systolic CHF (congestive heart failure) (HCC):No 2-D echo on record. No leg edema or JVD. CHF is compensated -Continue Coreg, cozarand aspirin  GERD: -Pepcid   Atrial Fibrillation: CHA2DS2-VASc Scoreis 3, needs oral anticoagulation. Patient is on Eliquis at home. Heart rate is well controlled. -continue Eliquis, coreg andamiodarone  Anxiety: -Continue home medications: Valium Discharge Instructions  Discharge Instructions    Diet - low sodium heart healthy    Complete by:  As directed    Increase activity slowly    Complete by:  As directed        Medication List    TAKE these medications   amiodarone 200 MG tablet Commonly known as:  PACERONE Take 200 mg by mouth Mon-Fri and 400 mg on Sat and Sun   apixaban 5 MG Tabs tablet Commonly known as:  ELIQUIS Take 1 tablet (5 mg total) by mouth 2 (two) times daily. Resume 09/29/15   aspirin 81 MG EC tablet Take 1 tablet (81 mg total) by mouth daily.   benzonatate 200 MG capsule Commonly known as:  TESSALON TAKE 1 CAPSULE BY MOUTH 3 TIMES DAILY AS  NEEDED FOR COUGH   carvedilol 3.125 MG tablet Commonly known as:  COREG Take 3.125 mg by mouth 2 (two) times daily with a meal.   diazepam 5 MG tablet Commonly known as:  VALIUM TAKE 1 TABLET BY MOUTH EVERY 6 HOURS AS NEEDED What changed:  See the new instructions.   donepezil 10 MG tablet Commonly known as:  ARICEPT Take 1 tablet (10 mg total) by mouth at bedtime.   doxycycline 100 MG tablet Commonly known as:   VIBRA-TABS Take 1 tablet (100 mg total) by mouth 2 (two) times daily.   fexofenadine 180 MG tablet Commonly known as:  ALLEGRA Take 180 mg by mouth daily as needed for allergies or rhinitis.   furosemide 20 MG tablet Commonly known as:  LASIX Take 1 tablet by mouth daily as needed for fluid/ swelling   HYDROcodone-acetaminophen 5-325 MG tablet Commonly known as:  NORCO/VICODIN TAKE 1 TABLET BY MOUTH EVERY 6 HOURS AS NEEDED FOR PAIN   losartan 25 MG tablet Commonly known as:  COZAAR Take 1 tablet (25 mg total) by mouth 2 (two) times daily.   metaxalone 800 MG tablet Commonly known as:  SKELAXIN TAKE 1 TABLET BY MOUTH 3 TIMES A DAY AS NEEDED FOR PAIN.   MULTIVITAMIN ADULTS 50+ Tabs Take 1 tablet by mouth daily.   NITROSTAT 0.4 MG SL tablet Generic drug:  nitroGLYCERIN PLACE 1 TABLET (0.4 MG TOTAL) UNDER THE TONGUE EVERY 5 (FIVE) MINUTES AS NEEDED. FOR CHEST PAIN.   promethazine 25 MG tablet Commonly known as:  PHENERGAN TAKE 1 TABLET BY MOUTH EVERY 6 HOURS AS NEEDED FOR NAUSEA/VOMITING   ranitidine 150 MG capsule Commonly known as:  ZANTAC Take 150 mg by mouth daily as needed for heartburn.   ranolazine 500 MG 12 hr tablet Commonly known as:  RANEXA Take 1 tablet (500 mg total) by mouth 2 (two) times daily.   traMADol 50 MG tablet Commonly known as:  ULTRAM TAKE 1 TO 2 TABLETS BY MOUTH EVERY 8 HOURS AS NEEDED What changed:  See the new instructions.   zolpidem 12.5 MG CR tablet Commonly known as:  AMBIEN CR TAKE 1 TABLET BY MOUTH AT BEDTIME What changed:  See the new instructions.       Allergies  Allergen Reactions  . Ace Inhibitors Other (See Comments)    muscle pain. Tolerates ARBs.   . Codeine Other (See Comments)    "head wants to explode."  . Penicillins Swelling    "started at point of injection; w/in 3 min my upper arm was swollen 3 times normal"  . Lisinopril     Muscle Pain  . Statins Other (See Comments)    Myalgias per patient      Procedures/Studies:   Dg Chest 2 View  Result Date: 11/06/2015 CLINICAL DATA:  Hypoxia, vomiting and diarrhea last night EXAM: CHEST  2 VIEW COMPARISON:  09/27/2015 and dating back through 11/12/2012 FINDINGS: The heart is enlarged. AICD device projects over the left hemithorax with single lead projecting in the expected location of the right ventricle. Left lower lobe airspace disease suspicious for pneumonia is noted with small loculated posterior pleural effusion. No acute osseous abnormality. Cholecystectomy clips present in the right upper quadrant. IMPRESSION: New left lower lobe infiltrate with small posterior loculated pleural effusion. Electronically Signed   By: Tollie Ethavid  Kwon M.D.   On: 11/06/2015 20:00   Ct Head Wo Contrast  Result Date: 10/13/2015 CLINICAL DATA:  Memory loss, possible TIA EXAM: CT HEAD WITHOUT  CONTRAST TECHNIQUE: Contiguous axial images were obtained from the base of the skull through the vertex without intravenous contrast. COMPARISON:  None FINDINGS: Brain: No intracranial hemorrhage, mass effect or midline shift. No definite acute cortical infarction. Moderate cerebral atrophy. There is periventricular and patchy subcortical white matter decreased attenuation probable due to chronic small vessel ischemic changes. No mass lesion is noted on this unenhanced scan. Vascular: Atherosclerotic calcifications of carotid siphon. Skull: Normal. Negative for fracture or focal lesion. Sinuses/Orbits: Paranasal sinuses and mastoid air cells are unremarkable. Other: None IMPRESSION: No acute intracranial abnormality. No definite acute cortical infarction. Moderate cerebral atrophy. Periventricular and patchy subcortical white matter decreased attenuation probable due to chronic small vessel ischemic changes. No definite acute cortical infarction. If there is high clinical suspicious for recent infarct further evaluation with MRI with diffusion imaging is recommended. Electronically  Signed   By: Natasha Mead M.D.   On: 10/13/2015 16:41       Discharge Exam: Vitals:   11/08/15 0612 11/08/15 1017  BP: 109/60 125/72  Pulse: (!) 54 (!) 101  Resp: 16 18  Temp: 97.8 F (36.6 C) 97.5 F (36.4 C)   Vitals:   11/07/15 2212 11/08/15 0612 11/08/15 1017 11/08/15 1043  BP: 119/63 109/60 125/72   Pulse: (!) 54 (!) 54 (!) 101   Resp: 16 16 18    Temp: 97.5 F (36.4 C) 97.8 F (36.6 C) 97.5 F (36.4 C)   TempSrc: Oral Oral Oral   SpO2: 93% 96% 93% 91%    General: Pt is alert, awake, not in acute distress Cardiovascular: RRR, S1/S2 +, no rubs, no gallops Respiratory: CTA bilaterally, no wheezing, no rhonchi Abdominal: Soft, NT, ND, bowel sounds + Extremities: no edema, no cyanosis    The results of significant diagnostics from this hospitalization (including imaging, microbiology, ancillary and laboratory) are listed below for reference.     Microbiology: Recent Results (from the past 240 hour(s))  Urine culture     Status: None   Collection Time: 11/06/15  4:38 PM  Result Value Ref Range Status   Organism ID, Bacteria NO GROWTH  Final  Respiratory Panel by PCR     Status: None   Collection Time: 11/07/15  3:14 AM  Result Value Ref Range Status   Adenovirus NOT DETECTED NOT DETECTED Final   Coronavirus 229E NOT DETECTED NOT DETECTED Final   Coronavirus HKU1 NOT DETECTED NOT DETECTED Final   Coronavirus NL63 NOT DETECTED NOT DETECTED Final   Coronavirus OC43 NOT DETECTED NOT DETECTED Final   Metapneumovirus NOT DETECTED NOT DETECTED Final   Rhinovirus / Enterovirus NOT DETECTED NOT DETECTED Final   Influenza A NOT DETECTED NOT DETECTED Final   Influenza B NOT DETECTED NOT DETECTED Final   Parainfluenza Virus 1 NOT DETECTED NOT DETECTED Final   Parainfluenza Virus 2 NOT DETECTED NOT DETECTED Final   Parainfluenza Virus 3 NOT DETECTED NOT DETECTED Final   Parainfluenza Virus 4 NOT DETECTED NOT DETECTED Final   Respiratory Syncytial Virus NOT DETECTED NOT  DETECTED Final   Bordetella pertussis NOT DETECTED NOT DETECTED Final   Chlamydophila pneumoniae NOT DETECTED NOT DETECTED Final   Mycoplasma pneumoniae NOT DETECTED NOT DETECTED Final     Labs: BNP (last 3 results)  Recent Labs  05/25/15 1612 09/27/15 0658 11/07/15 0253  BNP 252.2* 184.8* 184.2*   Basic Metabolic Panel:  Recent Labs Lab 11/06/15 1919 11/08/15 0649  NA 143 137  K 4.9 4.1  CL 109 105  CO2 25 28  GLUCOSE 100* 120*  BUN 16 12  CREATININE 1.14 1.05  CALCIUM 8.8* 8.1*   Liver Function Tests: No results for input(s): AST, ALT, ALKPHOS, BILITOT, PROT, ALBUMIN in the last 168 hours. No results for input(s): LIPASE, AMYLASE in the last 168 hours. No results for input(s): AMMONIA in the last 168 hours. CBC:  Recent Labs Lab 11/06/15 1919 11/08/15 0649  WBC 9.2 8.1  HGB 13.0 11.6*  HCT 41.4 37.6*  MCV 93.7 92.4  PLT 385 311   Cardiac Enzymes: No results for input(s): CKTOTAL, CKMB, CKMBINDEX, TROPONINI in the last 168 hours. BNP: Invalid input(s): POCBNP CBG: No results for input(s): GLUCAP in the last 168 hours. D-Dimer No results for input(s): DDIMER in the last 72 hours. Hgb A1c No results for input(s): HGBA1C in the last 72 hours. Lipid Profile No results for input(s): CHOL, HDL, LDLCALC, TRIG, CHOLHDL, LDLDIRECT in the last 72 hours. Thyroid function studies No results for input(s): TSH, T4TOTAL, T3FREE, THYROIDAB in the last 72 hours.  Invalid input(s): FREET3 Anemia work up No results for input(s): VITAMINB12, FOLATE, FERRITIN, TIBC, IRON, RETICCTPCT in the last 72 hours. Urinalysis    Component Value Date/Time   LABSPEC >=1.030 01/25/2010 0921   PHURINE 5.0 01/25/2010 0921   HGBUR moderate 01/25/2010 0921   BILIRUBINUR 1+ 11/06/2015 1605   PROTEINUR Neg 11/06/2015 1605   UROBILINOGEN 0.2 11/06/2015 1605   UROBILINOGEN 0.2 01/25/2010 0921   NITRITE Neg 11/06/2015 1605   NITRITE negative 01/25/2010 0921   LEUKOCYTESUR Negative  11/06/2015 1605   Sepsis Labs Invalid input(s): PROCALCITONIN,  WBC,  LACTICIDVEN Microbiology Recent Results (from the past 240 hour(s))  Urine culture     Status: None   Collection Time: 11/06/15  4:38 PM  Result Value Ref Range Status   Organism ID, Bacteria NO GROWTH  Final  Respiratory Panel by PCR     Status: None   Collection Time: 11/07/15  3:14 AM  Result Value Ref Range Status   Adenovirus NOT DETECTED NOT DETECTED Final   Coronavirus 229E NOT DETECTED NOT DETECTED Final   Coronavirus HKU1 NOT DETECTED NOT DETECTED Final   Coronavirus NL63 NOT DETECTED NOT DETECTED Final   Coronavirus OC43 NOT DETECTED NOT DETECTED Final   Metapneumovirus NOT DETECTED NOT DETECTED Final   Rhinovirus / Enterovirus NOT DETECTED NOT DETECTED Final   Influenza A NOT DETECTED NOT DETECTED Final   Influenza B NOT DETECTED NOT DETECTED Final   Parainfluenza Virus 1 NOT DETECTED NOT DETECTED Final   Parainfluenza Virus 2 NOT DETECTED NOT DETECTED Final   Parainfluenza Virus 3 NOT DETECTED NOT DETECTED Final   Parainfluenza Virus 4 NOT DETECTED NOT DETECTED Final   Respiratory Syncytial Virus NOT DETECTED NOT DETECTED Final   Bordetella pertussis NOT DETECTED NOT DETECTED Final   Chlamydophila pneumoniae NOT DETECTED NOT DETECTED Final   Mycoplasma pneumoniae NOT DETECTED NOT DETECTED Final     Time coordinating discharge: Over 30 minutes  SIGNED:   Calvert Cantor, MD  [facility] 11/08/2015, 12:33 PM Pager   If 7PM-7AM, please contact night-coverage [email or web address] Password [email or web address]

## 2015-11-08 NOTE — Progress Notes (Signed)
   11/08/15 1145  Clinical Encounter Type  Visited With Patient  Visit Type Other (Comment) (AD)  Referral From Nurse  Consult/Referral To Chaplain  Spiritual Encounters  Spiritual Needs Other (Comment) (AD)  Stress Factors  Patient Stress Factors Health changes  Assisted Pt with Advanced Directive. Notary and 2 witnesses contacted. Provided documentation to Pt and chart.

## 2015-11-09 ENCOUNTER — Telehealth: Payer: Self-pay

## 2015-11-09 NOTE — Telephone Encounter (Signed)
Transition Care Management Follow-up Telephone Call    Date discharged? 11/08/2015  How have you been since you were released from the hospital? Recovering. Pt is experiencing generalized weakness.   Any patient concerns? None at this time.    Items Reviewed:  Medications reviewed: Yes  Allergies reviewed: Yes  Dietary changes reviewed: N/A  Referrals reviewed: N/A   Functional Questionnaire:  Independent - I Dependent - D    Activities of Daily Living (ADLs):    Personal hygiene - I Dressing - I Eating - I Maintaining continence - I Transferring - I   Independent Activities of Daily Living (iADLs): Basic communication skills - I Transportation - I (temporary driving restrictions) Meal preparation  - I Shopping - I Housework - I  Managing medications - D (spouse takes care of medications) Managing personal finances - I    Confirmed importance and date/time of follow-up visits scheduled YES  Provider Appointment booked with PCP 11/17/15 @ 1130  Confirmed with patient if condition begins to worsen call PCP or go to the ER.  Patient was given the office number and encouraged to call back with question or concerns: YES

## 2015-11-10 LAB — NASAL CULTURE (N/P): CULTURE: NORMAL

## 2015-11-12 LAB — CULTURE, BLOOD (ROUTINE X 2)
CULTURE: NO GROWTH
Culture: NO GROWTH

## 2015-11-13 ENCOUNTER — Other Ambulatory Visit: Payer: Self-pay | Admitting: *Deleted

## 2015-11-14 ENCOUNTER — Telehealth: Payer: Self-pay | Admitting: Internal Medicine

## 2015-11-14 ENCOUNTER — Telehealth: Payer: Self-pay

## 2015-11-14 MED ORDER — PROMETHAZINE HCL 25 MG PO TABS: 12.5000 mg | ORAL_TABLET | Freq: Three times a day (TID) | ORAL | 0 refills | Status: DC | PRN

## 2015-11-14 NOTE — Telephone Encounter (Signed)
See cards phone note.  I agree with cards input.  If unable to hold anything down, then he is going to need to get checked.  If lightheaded, SOB, presyncopal, may end up needing IV fluids, ie ER.  It would be okay to try phenergan if needed, sedation caution.  rx sent.

## 2015-11-14 NOTE — Telephone Encounter (Signed)
New message   PCP was contacted patient insisted on message to be sent to Dr. [name] for review.   Pt C/O medication issue:  1. Name of Medication: doxycycline order by Dr. Collins [place] in Monday / Tuesday /Wednesday last week.  2. How are you currently taking this medication (dosage and times per day)? 100 mg   3. Are you having a reaction (difficulty breathing--STAT)? No   4. What is your medication issue? Loss 10 lbs. Nausea, diarherra

## 2015-11-14 NOTE — Telephone Encounter (Signed)
Patient and wife advised

## 2015-11-14 NOTE — Telephone Encounter (Signed)
Pt left v/m; Dr [name] had pt on abx due to pneumonia. Pt has had vomiting; cannot keep anything down. Pt has lost approx 10 lbs in one week. OTC meds for nausea and vomiting is not effective.  Pt request urgent cb. [facility]; also see note to cardiology today.

## 2015-11-14 NOTE — Telephone Encounter (Signed)
I called patient back to check on him. He states he is some better. He denies vomiting since we spoke last. He states he has been able to "keep medication down". I advised him to rest, focus on hydration, and call back in no more improvement/or if he has questions. He voiced understanding and agreed with plan.

## 2015-11-14 NOTE — Telephone Encounter (Signed)
I spoke to patient. He states he was diagnosed with pneumonia last week and rx'd doxycycline 100 mg BID.  He states he started vomiting and having diarrhea on day 3 of ATB.  He finished ATB yesterday, but is still having vomiting and diarrhea. He states his concern is weight loss and not being able to "keep heart medicine down". Pt admits to taking Emetrol as directed by pharmacist over the past few days, did not help.  He states he was able to take medications last night, did not vomit again until this am around 0840.  He states he took phenergan around 0900 with sips of water and has not vomited. He admits to loosing 10 lb since 11/06/15 ED visit.  At 0915 BP was 139/86, HR 49, pt reports this is WNL for him. He denies CP and SOB.   I advised him to wait 1 hour since last episode of vomiting and try 1 small sip of water every 5 minutes, as tolerated, for 1 hour.  I advised him to advance as tolerated (2 small sips Q 5-10 mins x 1 hr, etc).  I advised him if he tolerated liquids well w/o vomiting for a couple of hours he could try eating a few crackers,rice, or sliced bread to take medication.   I advised him to call back if unable to tolerate fluids/solids or take medication.  He voiced understanding.  Pt has appt w/ Dr Para March (PCP) 11/17/15.

## 2015-11-17 ENCOUNTER — Encounter (HOSPITAL_COMMUNITY): Payer: Self-pay

## 2015-11-17 ENCOUNTER — Inpatient Hospital Stay (HOSPITAL_COMMUNITY): Payer: Medicare Other

## 2015-11-17 ENCOUNTER — Inpatient Hospital Stay (HOSPITAL_COMMUNITY)
Admission: EM | Admit: 2015-11-17 | Discharge: 2015-11-21 | DRG: 189 | Disposition: A | Discharge status: Home Health | Payer: Medicare Other | Attending: Internal Medicine | Admitting: Internal Medicine

## 2015-11-17 ENCOUNTER — Emergency Department (HOSPITAL_COMMUNITY): Payer: Medicare Other

## 2015-11-17 ENCOUNTER — Ambulatory Visit (INDEPENDENT_AMBULATORY_CARE_PROVIDER_SITE_OTHER): Payer: Medicare Other

## 2015-11-17 ENCOUNTER — Ambulatory Visit: Payer: Medicare Other | Admitting: Family Medicine

## 2015-11-17 VITALS — BP 100/60 | HR 56 | Temp 99.2°F | Ht 63.0 in | Wt 144.5 lb

## 2015-11-17 DIAGNOSIS — I252 Old myocardial infarction: Secondary | ICD-10-CM | POA: Diagnosis not present

## 2015-11-17 DIAGNOSIS — Z0000 Encounter for general adult medical examination without abnormal findings: Secondary | ICD-10-CM

## 2015-11-17 DIAGNOSIS — Z95810 Presence of automatic (implantable) cardiac defibrillator: Secondary | ICD-10-CM

## 2015-11-17 DIAGNOSIS — G4700 Insomnia, unspecified: Secondary | ICD-10-CM | POA: Diagnosis present

## 2015-11-17 DIAGNOSIS — Z833 Family history of diabetes mellitus: Secondary | ICD-10-CM

## 2015-11-17 DIAGNOSIS — Z951 Presence of aortocoronary bypass graft: Secondary | ICD-10-CM | POA: Diagnosis not present

## 2015-11-17 DIAGNOSIS — K219 Gastro-esophageal reflux disease without esophagitis: Secondary | ICD-10-CM | POA: Diagnosis present

## 2015-11-17 DIAGNOSIS — R0602 Shortness of breath: Secondary | ICD-10-CM | POA: Diagnosis not present

## 2015-11-17 DIAGNOSIS — Z6825 Body mass index (BMI) 25.0-25.9, adult: Secondary | ICD-10-CM | POA: Diagnosis not present

## 2015-11-17 DIAGNOSIS — I472 Ventricular tachycardia, unspecified: Secondary | ICD-10-CM

## 2015-11-17 DIAGNOSIS — N179 Acute kidney failure, unspecified: Secondary | ICD-10-CM | POA: Diagnosis present

## 2015-11-17 DIAGNOSIS — J189 Pneumonia, unspecified organism: Secondary | ICD-10-CM | POA: Diagnosis not present

## 2015-11-17 DIAGNOSIS — J9601 Acute respiratory failure with hypoxia: Principal | ICD-10-CM | POA: Diagnosis present

## 2015-11-17 DIAGNOSIS — Z961 Presence of intraocular lens: Secondary | ICD-10-CM | POA: Diagnosis present

## 2015-11-17 DIAGNOSIS — Z7982 Long term (current) use of aspirin: Secondary | ICD-10-CM

## 2015-11-17 DIAGNOSIS — I5022 Chronic systolic (congestive) heart failure: Secondary | ICD-10-CM | POA: Diagnosis present

## 2015-11-17 DIAGNOSIS — R001 Bradycardia, unspecified: Secondary | ICD-10-CM | POA: Diagnosis present

## 2015-11-17 DIAGNOSIS — R0902 Hypoxemia: Secondary | ICD-10-CM

## 2015-11-17 DIAGNOSIS — I110 Hypertensive heart disease with heart failure: Secondary | ICD-10-CM | POA: Diagnosis present

## 2015-11-17 DIAGNOSIS — J439 Emphysema, unspecified: Secondary | ICD-10-CM | POA: Diagnosis not present

## 2015-11-17 DIAGNOSIS — Z880 Allergy status to penicillin: Secondary | ICD-10-CM

## 2015-11-17 DIAGNOSIS — I2510 Atherosclerotic heart disease of native coronary artery without angina pectoris: Secondary | ICD-10-CM | POA: Diagnosis present

## 2015-11-17 DIAGNOSIS — F419 Anxiety disorder, unspecified: Secondary | ICD-10-CM | POA: Diagnosis present

## 2015-11-17 DIAGNOSIS — Z885 Allergy status to narcotic agent status: Secondary | ICD-10-CM

## 2015-11-17 DIAGNOSIS — Z79899 Other long term (current) drug therapy: Secondary | ICD-10-CM

## 2015-11-17 DIAGNOSIS — R911 Solitary pulmonary nodule: Secondary | ICD-10-CM | POA: Diagnosis not present

## 2015-11-17 DIAGNOSIS — Z87891 Personal history of nicotine dependence: Secondary | ICD-10-CM

## 2015-11-17 DIAGNOSIS — R0600 Dyspnea, unspecified: Secondary | ICD-10-CM

## 2015-11-17 DIAGNOSIS — R079 Chest pain, unspecified: Secondary | ICD-10-CM | POA: Diagnosis not present

## 2015-11-17 DIAGNOSIS — Z9841 Cataract extraction status, right eye: Secondary | ICD-10-CM | POA: Diagnosis not present

## 2015-11-17 DIAGNOSIS — I4891 Unspecified atrial fibrillation: Secondary | ICD-10-CM | POA: Diagnosis present

## 2015-11-17 DIAGNOSIS — Z801 Family history of malignant neoplasm of trachea, bronchus and lung: Secondary | ICD-10-CM

## 2015-11-17 DIAGNOSIS — J440 Chronic obstructive pulmonary disease with acute lower respiratory infection: Secondary | ICD-10-CM | POA: Diagnosis present

## 2015-11-17 DIAGNOSIS — Z7901 Long term (current) use of anticoagulants: Secondary | ICD-10-CM

## 2015-11-17 DIAGNOSIS — Z823 Family history of stroke: Secondary | ICD-10-CM

## 2015-11-17 DIAGNOSIS — J449 Chronic obstructive pulmonary disease, unspecified: Secondary | ICD-10-CM

## 2015-11-17 DIAGNOSIS — Z888 Allergy status to other drugs, medicaments and biological substances status: Secondary | ICD-10-CM

## 2015-11-17 LAB — CBC
HEMATOCRIT: 39.2 % (ref 39.0–52.0)
Hemoglobin: 12.4 g/dL — ABNORMAL LOW (ref 13.0–17.0)
MCH: 28.4 pg (ref 26.0–34.0)
MCHC: 31.6 g/dL (ref 30.0–36.0)
MCV: 89.9 fL (ref 78.0–100.0)
Platelets: 341 10*3/uL (ref 150–400)
RBC: 4.36 MIL/uL (ref 4.22–5.81)
RDW: 14.8 % (ref 11.5–15.5)
WBC: 9.6 10*3/uL (ref 4.0–10.5)

## 2015-11-17 LAB — I-STAT TROPONIN, ED: Troponin i, poc: 0 ng/mL (ref 0.00–0.08)

## 2015-11-17 LAB — BASIC METABOLIC PANEL
Anion gap: 8 (ref 5–15)
BUN: 25 mg/dL — AB (ref 6–20)
CHLORIDE: 105 mmol/L (ref 101–111)
CO2: 26 mmol/L (ref 22–32)
Calcium: 8.7 mg/dL — ABNORMAL LOW (ref 8.9–10.3)
Creatinine, Ser: 1.52 mg/dL — ABNORMAL HIGH (ref 0.61–1.24)
GFR calc Af Amer: 51 mL/min — ABNORMAL LOW (ref >60)
GFR calc non Af Amer: 44 mL/min — ABNORMAL LOW (ref >60)
Glucose, Bld: 116 mg/dL — ABNORMAL HIGH (ref 65–99)
POTASSIUM: 4.3 mmol/L (ref 3.5–5.1)
SODIUM: 139 mmol/L (ref 135–145)

## 2015-11-17 LAB — HEPATIC FUNCTION PANEL
ALT: 116 U/L — ABNORMAL HIGH (ref 17–63)
AST: 145 U/L — ABNORMAL HIGH (ref 15–41)
Albumin: 3 g/dL — ABNORMAL LOW (ref 3.5–5.0)
Alkaline Phosphatase: 68 U/L (ref 38–126)
BILIRUBIN INDIRECT: 0.8 mg/dL (ref 0.3–0.9)
Bilirubin, Direct: 0.4 mg/dL (ref 0.1–0.5)
TOTAL PROTEIN: 6.5 g/dL (ref 6.5–8.1)
Total Bilirubin: 1.2 mg/dL (ref 0.3–1.2)

## 2015-11-17 LAB — I-STAT CG4 LACTIC ACID, ED: LACTIC ACID, VENOUS: 2.03 mmol/L — AB (ref 0.5–1.9)

## 2015-11-17 MED ORDER — LORATADINE 10 MG PO TABS
10.0000 mg | ORAL_TABLET | Freq: Every day | ORAL | Status: DC
Start: 2015-11-18 — End: 2015-11-21
  Administered 2015-11-18 – 2015-11-21 (×4): 10 mg via ORAL
  Filled 2015-11-17 (×4): qty 1

## 2015-11-17 MED ORDER — DIAZEPAM 5 MG PO TABS: 5.0000 mg | ORAL_TABLET | Freq: Two times a day (BID) | ORAL | Status: DC | PRN

## 2015-11-17 MED ORDER — APIXABAN 5 MG PO TABS
5.0000 mg | ORAL_TABLET | Freq: Two times a day (BID) | ORAL | Status: DC
  Administered 2015-11-17 – 2015-11-21 (×8): 5 mg via ORAL
  Filled 2015-11-17 (×8): qty 1

## 2015-11-17 MED ORDER — TRAMADOL HCL 50 MG PO TABS: 50.0000 mg | ORAL_TABLET | Freq: Three times a day (TID) | ORAL | Status: DC | PRN

## 2015-11-17 MED ORDER — PROMETHAZINE HCL 25 MG PO TABS: 12.5000 mg | ORAL_TABLET | Freq: Three times a day (TID) | ORAL | Status: DC | PRN

## 2015-11-17 MED ORDER — RANOLAZINE ER 500 MG PO TB12
500.0000 mg | ORAL_TABLET | Freq: Two times a day (BID) | ORAL | Status: DC
  Administered 2015-11-17 – 2015-11-21 (×8): 500 mg via ORAL
  Filled 2015-11-17 (×8): qty 1

## 2015-11-17 MED ORDER — METAXALONE 800 MG PO TABS
800.0000 mg | ORAL_TABLET | Freq: Three times a day (TID) | ORAL | Status: DC | PRN
  Filled 2015-11-17: qty 1

## 2015-11-17 MED ORDER — FAMOTIDINE 20 MG PO TABS
10.0000 mg | ORAL_TABLET | Freq: Two times a day (BID) | ORAL | Status: DC
  Administered 2015-11-17 – 2015-11-21 (×8): 10 mg via ORAL
  Filled 2015-11-17 (×8): qty 1

## 2015-11-17 MED ORDER — AZTREONAM 2 G IJ SOLR
2.0000 g | Freq: Once | INTRAMUSCULAR | Status: AC
  Administered 2015-11-17: 2 g via INTRAVENOUS
  Filled 2015-11-17: qty 2

## 2015-11-17 MED ORDER — LEVALBUTEROL HCL 1.25 MG/0.5ML IN NEBU
1.2500 mg | INHALATION_SOLUTION | Freq: Four times a day (QID) | RESPIRATORY_TRACT | Status: DC | PRN
  Filled 2015-11-17: qty 0.5

## 2015-11-17 MED ORDER — VANCOMYCIN HCL IN DEXTROSE 1-5 GM/200ML-% IV SOLN
1000.0000 mg | Freq: Once | INTRAVENOUS | Status: AC
  Administered 2015-11-17: 1000 mg via INTRAVENOUS
  Filled 2015-11-17: qty 200

## 2015-11-17 MED ORDER — ASPIRIN EC 81 MG PO TBEC
81.0000 mg | DELAYED_RELEASE_TABLET | Freq: Every day | ORAL | Status: DC
  Administered 2015-11-18 – 2015-11-21 (×4): 81 mg via ORAL
  Filled 2015-11-17 (×4): qty 1

## 2015-11-17 MED ORDER — ADULT MULTIVITAMIN W/MINERALS CH
1.0000 | ORAL_TABLET | Freq: Every day | ORAL | Status: DC
  Administered 2015-11-18 – 2015-11-21 (×4): 1 via ORAL
  Filled 2015-11-17 (×4): qty 1

## 2015-11-17 MED ORDER — ZOLPIDEM TARTRATE 5 MG PO TABS: 5.0000 mg | ORAL_TABLET | Freq: Every evening | ORAL | Status: DC | PRN

## 2015-11-17 MED ORDER — AZTREONAM 2 G IJ SOLR
2.0000 g | Freq: Three times a day (TID) | INTRAMUSCULAR | Status: DC
  Administered 2015-11-17 – 2015-11-20 (×8): 2 g via INTRAVENOUS
  Filled 2015-11-17 (×10): qty 2

## 2015-11-17 MED ORDER — VANCOMYCIN HCL IN DEXTROSE 1-5 GM/200ML-% IV SOLN
1000.0000 mg | INTRAVENOUS | Status: DC
  Administered 2015-11-18 – 2015-11-19 (×2): 1000 mg via INTRAVENOUS
  Filled 2015-11-17 (×3): qty 200

## 2015-11-17 MED ORDER — HYDROCODONE-ACETAMINOPHEN 5-325 MG PO TABS
1.0000 | ORAL_TABLET | Freq: Four times a day (QID) | ORAL | Status: DC | PRN
  Administered 2015-11-20: 1 via ORAL
  Filled 2015-11-17: qty 1

## 2015-11-17 NOTE — ED Notes (Signed)
Pt returned from c-t  No pain  He does not want to eat his dinner  He is not hungry  He ate a sandwich earlier

## 2015-11-17 NOTE — Progress Notes (Signed)
PCP notes:   Health maintenance:  Flu vaccine - postponed  Abnormal screenings:   None  Patient concerns:   Pt has complaint of generalized weakness.  Nurse concerns:  Pt's oxygen sats were ranging between 78%-84% on room air. Pt was given 2L oxygen in order to bring oxygen sats above 90%. PCP notified. PCP assessed patient with different pulse oximeters.  PCP increased oxygen to 3L because O2 sat was at 89%. Based on assessment, PCP recommended patient seek emergency treatment.   Next PCP appt:   Unknown at this time.

## 2015-11-17 NOTE — Progress Notes (Signed)
Pharmacy Antibiotic Note  [name] is a 70 y.o. male admitted on 11/17/2015 with pneumonia.  Pharmacy has been consulted for vancomycin dosing. Pt is afebrile and WBC is WNL. Scr is elevated at 1.52.   Plan: - vancomycin 1gm IV Q24H - F/u renal fxn, C&S, clinical status and trough at [facility] - F/u continuation of aztreonam or other gram negative coverage  Height: 5\' 3"  (160 cm) Weight: 144 lb (65.3 kg) IBW/kg (Calculated) : 56.9  Temp (24hrs), Avg:98.5 F (36.9 C), Min:97.8 F (36.6 C), Max:99.2 F (37.3 C)   Recent Labs Lab 11/17/15 1257  WBC 9.6  CREATININE 1.52*    Estimated Creatinine Clearance: 35.9 mL/min (by C-G formula based on SCr of 1.52 mg/dL (H)).    Allergies  Allergen Reactions  . Ace Inhibitors Other (See Comments)    muscle pain. Tolerates ARBs.   . Codeine Other (See Comments)    "head wants to explode."  . Penicillins Swelling    "started at point of injection; w/in 3 min my upper arm was swollen 3 times normal"  . Lisinopril     Muscle Pain  . Statins Other (See Comments)    Myalgias per patient    Antimicrobials this admission: Vanc 10/13>> Aztreo x 1 10/13  Dose adjustments this admission: N/A  Microbiology results: Pending  Thank you for allowing pharmacy to be a part of this patient's care.  Marranda Arakelian, Drake Leach 11/17/2015 1:55 PM

## 2015-11-17 NOTE — H&P (Signed)
History and Physical    [name] WUJ:[id number] DOB: [birth year] DOA: 11/17/2015  Referring MD/NP/PA: Dr. Madilyn Hookees   PCP: Crawford GivensGraham Duncan, MD   Patient coming from: Home   Chief Complaint: hypoxia at the PCP office   HPI:  70 y.o.malewith medical history significant of COPD, GERD, anxiety, bradycardia, s/p of AICD, sCHF (no 2D echo on record), A fib on Eliquis, CAD, s/p of CABG, recently discharged on Oct 4th, 2017 after being treated for LLL CAP and who nowpresents from PCP office where he was seen earlier today and found to be hypoxic. Pt was referred for an admission but is very clear he has not experienced any dyspnea, chest pain, fevers, chills and actually says he has felt bit better since his recent discharged. He does mention he was discharged on doxycycline on last discharge but did not tolerate medicine well and was throwing up and having some diarrhea, this has resolved since he has completed ABX.   In ED, pt noted to be hypoxic on RA in 80's and requiring [place] 2-3 L which was improved the oxygen sats to low 90's. VS notable for mild bradycardia with HR in 50's otherwise stable. Blood work unremarkable.   Hospital Course:  Acute respiratory failure with hypoxia (HCC) due to possibleHCAP - recent admission with LLL CAP unknown pathogen  - CXR on this admission with persistent left lower and developing left upper lobe airspace disease, consistent with worsening PNA or asymmetric pulmonary edema - I do suspect worsening PNA if pt was unable to tolerate PO ABX as he was clear he was vomiting and having diarrhea  - will admit to telemetry unit as pt appears to be hemodynamically stable  - place on broad spectrum ABX for now, pneumonia order set in place  - CT chest ordered for clearer evaluation   AICD for V tach - was admitted last month due to AICD firing -  no cause found- cont Amiodarone  - keep on telemetry   Coronary atherosclerosis - s/p of CABG, no CP - Continue  aspirin, Coreg, Ranexa and prn NTG  Bradycardia - hold BB and donepezil until HR stabilizes   COPD (chronic obstructive pulmonary disease) (HCC) - with no acute exacerbation   Chronic systolic CHF (congestive heart failure) (HCC) - No 2-D echo on record. No leg edema or JVD. CHF is compensated - hold Coreg, cozaar SBP is on low end of normal   Acute kidney injury - from poor oral intake and recent diarrhea and vomiting which have since resolved - repeat BMP in AM  GERD - continue home regimen with Pepcid   Atrial Fibrillation - CHA2DS2-VASc Scoreis 3, needs oral anticoagulation.  - Patient is on Eliquis at home. Heart rate is well controlled. - continue Eliquis andamiodarone  Anxiety - Continue home medications: Valium  Review of Systems:  Constitutional: Negative for fever, chills, diaphoresis, activity change, and fatigue.  HENT: Negative for ear pain, nosebleeds, congestion, facial swelling, rhinorrhea, neck pain, neck stiffness and ear discharge.   Eyes: Negative for pain, discharge, redness, itching and visual disturbance.  Respiratory: Negative for wheezing and stridor.   Cardiovascular: Negative for chest pain, palpitations and leg swelling.  Gastrointestinal: Negative for abdominal distention.  Genitourinary: Negative for dysuria, urgency, frequency, hematuria, flank pain, decreased urine volume, difficulty urinating and dyspareunia.  Musculoskeletal: Negative for back pain, joint swelling, arthralgias and gait problem.  Neurological: Negative for dizziness, tremors, seizures, syncope, facial asymmetry, speech difficulty, weakness, light-headedness, numbness and headaches.  Hematological: Negative for adenopathy. Does not bruise/bleed easily.  Psychiatric/Behavioral: Negative for hallucinations, behavioral problems, confusion, dysphoric mood, decreased concentration and agitation.   Past Medical History:  Diagnosis Date  . Acute lower GI bleeding 12/11/2011    "first time" (12/11/2011)  . Acute myocardial infarction, unspecified site, episode of care unspecified 1995   Pt living in [place], no stent, ?PTCA  . AICD (automatic cardioverter/defibrillator) present   . Allergic rhinitis, cause unspecified   . Anxiety   . Arthritis    "all over" (11/07/2015)  . Atrial fibrillation (HCC)    a. 05/2015 - converted to sinus in setting of ICD shocks; placed on eliquis 5 bid.  [name] CAD (coronary artery disease), autologous vein bypass graft   . Cervical herniated disc    told not to lift >10 lbs  . Chronic systolic CHF (congestive heart failure), NYHA class 2 (HCC)    Reports EF of 25%.   [name] COPD (chronic obstructive pulmonary disease) (HCC) 11/2012   by xray  . Diverticulosis    by CT scan  . HCAP (healthcare-associated pneumonia) 11/06/2015  . Hypertension   . Insomnia   . Paroxysmal ventricular tachycardia (HCC)   . Perennial allergic rhinitis    only to dust mites  . Pneumonia 2000s   "walking pneumonia"  . VT (ventricular tachycardia) (HCC)    a. 05/2015 - VT storm with multiple ICD shocks-->Amio 400 BID.    Past Surgical History:  Procedure Laterality Date  . CARDIAC CATHETERIZATION  2004   LAD 30%, D1 30%, CFX-AV groove 70-80%, OM1 30%, EF 20-25%  . CARDIAC CATHETERIZATION N/A 09/28/2015   Procedure: Left Heart Cath and Coronary Angiography;  Surgeon: Peter M Swaziland, MD;  Location: [facility]VAS[facility];  Service: Cardiovascular;  Laterality: N/A;  . CARDIAC DEFIBRILLATOR PLACEMENT  2004  . CATARACT EXTRACTION W/ INTRAOCULAR LENS IMPLANT Right 01/2012  . COLONOSCOPY  01/08/2012   Procedure: COLONOSCOPY;  Surgeon: Iva Boop, MD;  Location: [facility] ENDOSCOPY;  Service: Endoscopy;  Laterality: N/A;  . CORONARY ANGIOPLASTY  1995   Pt thinks he got a balloon, living in [place], [place]  . IMPLANTABLE CARDIOVERTER DEFIBRILLATOR GENERATOR CHANGE N/A 02/07/2012   Procedure: IMPLANTABLE CARDIOVERTER DEFIBRILLATOR GENERATOR CHANGE;  Surgeon: Marinus Maw, MD;  Medtronic Evera XT VR single-chamber serial number [id number], Laterality: Left  . INSERT / REPLACE / REMOVE PACEMAKER  2004   Medtronic ICD  . KNEE ARTHROSCOPY Left 05/2003   Hattie Perch 06/19/2010  . LAPAROSCOPIC CHOLECYSTECTOMY  1/ 2012  . SHOULDER ARTHROSCOPY W/ ROTATOR CUFF REPAIR Right twice  . TONSILLECTOMY AND ADENOIDECTOMY  ~ 69   Social Hx:  reports that he quit smoking about 7 weeks ago. His smoking use included Cigarettes and Cigars. He has a 25.00 pack-year smoking history. He has never used smokeless tobacco. He reports that he does not drink alcohol or use drugs.  Allergies  Allergen Reactions  . Ace Inhibitors Other (See Comments)    muscle pain. Tolerates ARBs.   . Codeine Other (See Comments)    "head wants to explode."  . Penicillins Swelling    "started at point of injection; w/in 3 min my upper arm was swollen 3 times normal"  . Lisinopril     Muscle Pain  . Statins Other (See Comments)    Myalgias per patient    Family History  Problem Relation Age of Onset  . Diabetes Father   . Tracheal cancer Father 65    smoker  . Stroke  Mother   . Cancer Sister     left eye  . CAD Neg Hx   . Colon cancer Neg Hx   . Prostate cancer Neg Hx     Prior to Admission medications   Medication Sig Start Date End Date Taking? Authorizing Provider  amiodarone (PACERONE) 200 MG tablet Take 200 mg by mouth Mon-Fri and 400 mg on Sat and Sun 07/25/15   Marinus Maw, MD  apixaban (ELIQUIS) 5 MG TABS tablet Take 1 tablet (5 mg total) by mouth 2 (two) times daily. Resume 09/29/15 09/28/15   Marily Lente, NP  aspirin 81 MG EC tablet Take 1 tablet (81 mg total) by mouth daily. 05/28/15   Ok Anis, NP  benzonatate (TESSALON) 200 MG capsule TAKE 1 CAPSULE BY MOUTH 3 TIMES DAILY AS NEEDED FOR COUGH 10/31/14   Joaquim Nam, MD  carvedilol (COREG) 3.125 MG tablet Take 3.125 mg by mouth 2 (two) times daily with a meal.    Historical Provider, MD  diazepam (VALIUM) 5 MG tablet  TAKE 1 TABLET BY MOUTH EVERY 6 HOURS AS NEEDED Patient taking differently: TAKE 1 TABLET BY MOUTH EVERY 6 HOURS AS NEEDED FOR ANXIETY 10/04/15   Joaquim Nam, MD  donepezil (ARICEPT) 10 MG tablet Take 1 tablet (10 mg total) by mouth at bedtime. 11/01/15   Joaquim Nam, MD  fexofenadine (ALLEGRA) 180 MG tablet Take 180 mg by mouth daily as needed for allergies or rhinitis.    Historical Provider, MD  furosemide (LASIX) 20 MG tablet Take 1 tablet by mouth daily as needed for fluid/ swelling    Historical Provider, MD  HYDROcodone-acetaminophen (NORCO/VICODIN) 5-325 MG tablet TAKE 1 TABLET BY MOUTH EVERY 6 HOURS AS NEEDED FOR PAIN 10/10/15   Joaquim Nam, MD  losartan (COZAAR) 25 MG tablet Take 1 tablet (25 mg total) by mouth 2 (two) times daily. 11/06/15   Joaquim Nam, MD  metaxalone (SKELAXIN) 800 MG tablet TAKE 1 TABLET BY MOUTH 3 TIMES A DAY AS NEEDED FOR PAIN. 10/10/15   Joaquim Nam, MD  Multiple Vitamins-Minerals (MULTIVITAMIN ADULTS 50+) TABS Take 1 tablet by mouth daily.    Historical Provider, MD  NITROSTAT 0.4 MG SL tablet PLACE 1 TABLET (0.4 MG TOTAL) UNDER THE TONGUE EVERY 5 (FIVE) MINUTES AS NEEDED. FOR CHEST PAIN. 06/11/15   Joaquim Nam, MD  promethazine (PHENERGAN) 25 MG tablet Take 0.5-1 tablets (12.5-25 mg total) by mouth every 8 (eight) hours as needed for nausea or vomiting. 11/14/15   Joaquim Nam, MD  ranitidine (ZANTAC) 150 MG capsule Take 150 mg by mouth daily as needed for heartburn.     Historical Provider, MD  ranolazine (RANEXA) 500 MG 12 hr tablet Take 1 tablet (500 mg total) by mouth 2 (two) times daily. 09/28/15   Amber Caryl Bis, NP  traMADol (ULTRAM) 50 MG tablet TAKE 1 TO 2 TABLETS BY MOUTH EVERY 8 HOURS AS NEEDED Patient taking differently: TAKE 1 TO 2 TABLETS BY MOUTH EVERY 8 HOURS AS NEEDED FOR PAIN. 10/24/15   Joaquim Nam, MD  zolpidem (AMBIEN CR) 12.5 MG CR tablet TAKE 1 TABLET BY MOUTH AT BEDTIME Patient taking differently: TAKE 1 TABLET BY MOUTH AT  BEDTIME AS NEEDED FOR SLEEP 07/19/15   Joaquim Nam, MD    Physical Exam: Vitals:   11/17/15 1248 11/17/15 1249 11/17/15 1453  BP: 120/68  97/58  Pulse: (!) 56  (!) 52  Resp: 22  14  Temp: 97.8 F (36.6 C)    TempSrc: Oral    SpO2: (!) 88%  95%  Weight:  65.3 kg (144 lb)   Height:  5\' 3"  (1.6 m)     Constitutional: NAD, calm, comfortable Vitals:   11/17/15 1248 11/17/15 1249 11/17/15 1453  BP: 120/68  97/58  Pulse: (!) 56  (!) 52  Resp: 22  14  Temp: 97.8 F (36.6 C)    TempSrc: Oral    SpO2: (!) 88%  95%  Weight:  65.3 kg (144 lb)   Height:  5\' 3"  (1.6 m)    Eyes: PERRL, lids and conjunctivae normal ENMT: Mucous membranes are moist. Posterior pharynx clear of any exudate or lesions.Normal dentition.  Neck: normal, supple, no masses, no thyromegaly Respiratory: Normal respiratory effort. No accessory muscle use. Diminished breath sounds at bases  Cardiovascular: Mild bradycardia, no murmurs / rubs / gallops. No extremity edema. 2+ pedal pulses. No carotid bruits.  Abdomen: no tenderness, no masses palpated. No hepatosplenomegaly. Bowel sounds positive.  Musculoskeletal: no clubbing / cyanosis. No joint deformity upper and lower extremities. Good ROM, no contractures. Normal muscle tone.  Skin: no rashes, lesions, ulcers. No induration Neurologic: CN 2-12 grossly intact. Sensation intact, DTR normal. Strength 5/5 in all 4.  Psychiatric: Normal judgment and insight. Alert and oriented x 3. Normal mood.   Labs on Admission: I have personally reviewed following labs and imaging studies  CBC:  Recent Labs Lab 11/17/15 1257  WBC 9.6  HGB 12.4*  HCT 39.2  MCV 89.9  PLT 341   Basic Metabolic Panel:  Recent Labs Lab 11/17/15 1257  NA 139  K 4.3  CL 105  CO2 26  GLUCOSE 116*  BUN 25*  CREATININE 1.52*  CALCIUM 8.7*   Liver Function Tests:  Recent Labs Lab 11/17/15 1312  AST 145*  ALT 116*  ALKPHOS 68  BILITOT 1.2  PROT 6.5  ALBUMIN 3.0*     Recent Results (from the past 240 hour(s))  Nasal culture     Status: None   Collection Time: 11/08/15  7:48 AM  Result Value Ref Range Status   Specimen Description NASOPHARYNGEAL  Final   Special Requests NONE  Final   Culture Consistent with normal respiratory flora.  Final   Report Status 11/10/2015 FINAL  Final    Radiological Exams on Admission: Dg Chest 2 View  Result Date: 11/17/2015 CLINICAL DATA:  Chest pain.  Recent pneumonia. EXAM: CHEST  2 VIEW COMPARISON:  11/06/2015 FINDINGS: Pacer/AICD device. Midline trachea. Mild cardiomegaly. No pleural effusion or pneumothorax. Diffuse interstitial thickening. Persistent left lower lobe and developing left upper lobe airspace disease. IMPRESSION: Persistent left lower and developing left upper lobe airspace disease. This is superimposed upon cardiomegaly and diffuse interstitial prominence. Favor progressive pneumonia. Asymmetric pulmonary edema could look similar. Electronically Signed   By: Jeronimo Greaves M.D.   On: 11/17/2015 13:25    EKG: pending   DVT prophylaxis: on Eliquis  Code Status: Full  Family Communication: Pt and wife updated at bedside Disposition Plan: Will likely go home Consults called: None Admission status: Inpatient   Debbora Presto MD [facility] Pager [phone]  If 7PM-7AM, please contact night-coverage [email or web address] Password [email or web address]  11/17/2015, 3:40 PM

## 2015-11-17 NOTE — Progress Notes (Signed)
Pulse Ox >90 with 2-3 L O2. Still speaking in complete sentences and CTAB but clearly with abnormal pulse ox and rec EMS transport to ER given h/o cardiac disease and h/o PNA.  Patient agrees. EMS here and will transport patient.  Wife and patient agree.    I reviewed health advisor's note, was available for consultation on the day of service listed in this note, and agree with documentation and plan. Crawford Givens, MD.

## 2015-11-17 NOTE — Progress Notes (Signed)
Subjective:   [name] is a 70 y.o. male who presents for Medicare Annual/Subsequent preventive examination.  Review of Systems:  N/A  Cardiac Risk Factors include: advanced age (>2men, >41 women);dyslipidemia;male gender     Objective:    Vitals: BP 100/60 (BP Location: Left Arm, Patient Position: Sitting, Cuff Size: Normal)   Pulse (!) 56   Temp 99.2 F (37.3 C) (Oral)   Ht 5\' 3"  (1.6 m) Comment: no shoes  Wt 144 lb 8 oz (65.5 kg)   SpO2 95% Comment: O2 @ 2L  BMI 25.60 kg/m   Body mass index is 25.6 kg/m.  Tobacco History  Smoking Status  . Former Smoker  . Packs/day: 0.50  . Years: 50.00  . Types: Cigarettes, Cigars  . Quit date: 09/27/2015  Smokeless Tobacco  . Never Used     Counseling given: No   Past Medical History:  Diagnosis Date  . Acute lower GI bleeding 12/11/2011   "first time" (12/11/2011)  . Acute myocardial infarction, unspecified site, episode of care unspecified 1995   Pt living in [place], no stent, ?PTCA  . AICD (automatic cardioverter/defibrillator) present   . Allergic rhinitis, cause unspecified   . Anxiety   . Arthritis    "all over" (11/07/2015)  . Atrial fibrillation (HCC)    a. 05/2015 - converted to sinus in setting of ICD shocks; placed on eliquis 5 bid.  [name] CAD (coronary artery disease), autologous vein bypass graft   . Cervical herniated disc    told not to lift >10 lbs  . Chronic systolic CHF (congestive heart failure), NYHA class 2 (HCC)    Reports EF of 25%.   [name] COPD (chronic obstructive pulmonary disease) (HCC) 11/2012   by xray  . Diverticulosis    by CT scan  . HCAP (healthcare-associated pneumonia) 11/06/2015  . Hypertension   . Insomnia   . Paroxysmal ventricular tachycardia (HCC)   . Perennial allergic rhinitis    only to dust mites  . Pneumonia 2000s   "walking pneumonia"  . VT (ventricular tachycardia) (HCC)    a. 05/2015 - VT storm with multiple ICD shocks-->Amio 400 BID.   Past Surgical History:    Procedure Laterality Date  . CARDIAC CATHETERIZATION  2004   LAD 30%, D1 30%, CFX-AV groove 70-80%, OM1 30%, EF 20-25%  . CARDIAC CATHETERIZATION N/A 09/28/2015   Procedure: Left Heart Cath and Coronary Angiography;  Surgeon: Peter M Swaziland, MD;  Location: [facility] INVASIVE CV LAB;  Service: Cardiovascular;  Laterality: N/A;  . CARDIAC DEFIBRILLATOR PLACEMENT  2004  . CATARACT EXTRACTION W/ INTRAOCULAR LENS IMPLANT Right 01/2012  . COLONOSCOPY  01/08/2012   Procedure: COLONOSCOPY;  Surgeon: Iva Boop, MD;  Location: [facility] ENDOSCOPY;  Service: Endoscopy;  Laterality: N/A;  . CORONARY ANGIOPLASTY  1995   Pt thinks he got a balloon, living in [place], [place]  . IMPLANTABLE CARDIOVERTER DEFIBRILLATOR GENERATOR CHANGE N/A 02/07/2012   Procedure: IMPLANTABLE CARDIOVERTER DEFIBRILLATOR GENERATOR CHANGE;  Surgeon: Marinus Maw, MD; Medtronic Evera XT VR single-chamber serial number [id number], Laterality: Left  . INSERT / REPLACE / REMOVE PACEMAKER  2004   Medtronic ICD  . KNEE ARTHROSCOPY Left 05/2003   Hattie Perch 06/19/2010  . LAPAROSCOPIC CHOLECYSTECTOMY  1/ 2012  . SHOULDER ARTHROSCOPY W/ ROTATOR CUFF REPAIR Right twice  . TONSILLECTOMY AND ADENOIDECTOMY  ~ 1951   Family History  Problem Relation Age of Onset  . Diabetes Father   . Tracheal cancer Father 54  smoker  . Stroke Mother   . Cancer Sister     left eye  . CAD Neg Hx   . Colon cancer Neg Hx   . Prostate cancer Neg Hx    History  Sexual Activity  . Sexual activity: Not Currently    Outpatient Encounter Prescriptions as of 11/17/2015  Medication Sig  . amiodarone (PACERONE) 200 MG tablet Take 200 mg by mouth Mon-Fri and 400 mg on Sat and Sun  . apixaban (ELIQUIS) 5 MG TABS tablet Take 1 tablet (5 mg total) by mouth 2 (two) times daily. Resume 09/29/15  . aspirin 81 MG EC tablet Take 1 tablet (81 mg total) by mouth daily.  . benzonatate (TESSALON) 200 MG capsule TAKE 1 CAPSULE BY MOUTH 3 TIMES DAILY AS NEEDED FOR COUGH  .  carvedilol (COREG) 3.125 MG tablet Take 3.125 mg by mouth 2 (two) times daily with a meal.  . diazepam (VALIUM) 5 MG tablet TAKE 1 TABLET BY MOUTH EVERY 6 HOURS AS NEEDED (Patient taking differently: TAKE 1 TABLET BY MOUTH EVERY 6 HOURS AS NEEDED FOR ANXIETY)  . donepezil (ARICEPT) 10 MG tablet Take 1 tablet (10 mg total) by mouth at bedtime.  . fexofenadine (ALLEGRA) 180 MG tablet Take 180 mg by mouth daily as needed for allergies or rhinitis.  . furosemide (LASIX) 20 MG tablet Take 1 tablet by mouth daily as needed for fluid/ swelling  . HYDROcodone-acetaminophen (NORCO/VICODIN) 5-325 MG tablet TAKE 1 TABLET BY MOUTH EVERY 6 HOURS AS NEEDED FOR PAIN  . losartan (COZAAR) 25 MG tablet Take 1 tablet (25 mg total) by mouth 2 (two) times daily.  . metaxalone (SKELAXIN) 800 MG tablet TAKE 1 TABLET BY MOUTH 3 TIMES A DAY AS NEEDED FOR PAIN.  . Multiple Vitamins-Minerals (MULTIVITAMIN ADULTS 50+) TABS Take 1 tablet by mouth daily.  [name] NITROSTAT 0.4 MG SL tablet PLACE 1 TABLET (0.4 MG TOTAL) UNDER THE TONGUE EVERY 5 (FIVE) MINUTES AS NEEDED. FOR CHEST PAIN.  [name] promethazine (PHENERGAN) 25 MG tablet Take 0.5-1 tablets (12.5-25 mg total) by mouth every 8 (eight) hours as needed for nausea or vomiting.  . ranitidine (ZANTAC) 150 MG capsule Take 150 mg by mouth daily as needed for heartburn.   . ranolazine (RANEXA) 500 MG 12 hr tablet Take 1 tablet (500 mg total) by mouth 2 (two) times daily.  . traMADol (ULTRAM) 50 MG tablet TAKE 1 TO 2 TABLETS BY MOUTH EVERY 8 HOURS AS NEEDED (Patient taking differently: TAKE 1 TO 2 TABLETS BY MOUTH EVERY 8 HOURS AS NEEDED FOR PAIN.)  . zolpidem (AMBIEN CR) 12.5 MG CR tablet TAKE 1 TABLET BY MOUTH AT BEDTIME (Patient taking differently: TAKE 1 TABLET BY MOUTH AT BEDTIME AS NEEDED FOR SLEEP)  . [DISCONTINUED] doxycycline (VIBRA-TABS) 100 MG tablet Take 1 tablet (100 mg total) by mouth 2 (two) times daily.   Facility-Administered Encounter Medications as of 11/17/2015   Medication  . sodium chloride 0.9 % injection 3 mL  . sodium chloride 0.9 % injection 3 mL    Activities of Daily Living In your present state of health, do you have any difficulty performing the following activities: 11/17/2015 11/07/2015  Hearing? Y N  Vision? N N  Difficulty concentrating or making decisions? N N  Walking or climbing stairs? N N  Dressing or bathing? N N  Doing errands, shopping? N N  Preparing Food and eating ? N -  Using the Toilet? N -  In the past six months, have you  accidently leaked urine? N -  Do you have problems with loss of bowel control? N -  Managing your Medications? N -  Managing your Finances? N -  Housekeeping or managing your Housekeeping? N -  Some recent data might be hidden    Patient Care Team: Joaquim NamGraham S Duncan, MD as PCP - General (Family Medicine) Marinus MawGregg W Taylor, MD as Consulting Physician (Cardiology) Stevphen RochesterEugene [place], MD as Consulting Physician (Pulmonary Disease)   Assessment:    Pt requested hearing and vision screening be postponed until CPE.   Exercise Activities and Dietary recommendations Current Exercise Habits: The patient does not participate in regular exercise at present, Exercise limited by: respiratory conditions(s) (pt is recovering from pneumonia)  Fall Risk Fall Risk  11/17/2015 09/15/2014 08/17/2013 06/25/2012  Falls in the past year? No No No No   Depression Screen PHQ 2/9 Scores 11/17/2015 09/15/2014 08/17/2013 06/25/2012  PHQ - 2 Score 0 0 0 0    Cognitive Testing MMSE - Mini Mental State Exam 11/17/2015  Orientation to time 5  Orientation to Place 5  Registration 3  Attention/ Calculation 0  Recall 3  Language- name 2 objects 0  Language- repeat 1  Language- follow 3 step command 3  Language- read & follow direction 0  Write a sentence 0  Copy design 0  Total score 20   PLEASE NOTE: A Mini-Cog screen was completed. Maximum score is 20. A value of 0 denotes this part of Folstein MMSE was not completed  or the patient failed this part of the Mini-Cog screening.   Mini-Cog Screening Orientation to Time - Max 5 pts Orientation to Place - Max 5 pts Registration - Max 3 pts Recall - Max 3 pts Language Repeat - Max 1 pts Language Follow 3 Step Command - Max 3 pts   Immunization History  Administered Date(s) Administered  . Influenza Split 01/24/2012  . Influenza,inj,Quad PF,36+ Mos 11/05/2012, 10/28/2014  . Pneumococcal Conjugate-13 08/17/2013  . Pneumococcal Polysaccharide-23 06/25/2012  . Td 02/05/2007   Screening Tests Health Maintenance  Topic Date Due  . INFLUENZA VACCINE  05/04/2016 (Originally 09/05/2015)  . ZOSTAVAX  09/16/2019 (Originally 05/30/2004)  . TETANUS/TDAP  02/04/2017  . COLONOSCOPY  01/07/2022  . Hepatitis C Screening  Completed  . PNA vac Low Risk Adult  Completed      Plan:     I have personally reviewed and addressed the Medicare Annual Wellness questionnaire and have noted the following in the patient's chart:  A. Medical and social history B. Use of alcohol, tobacco or illicit drugs  C. Current medications and supplements D. Functional ability and status E.  Nutritional status F.  Physical activity G. Advance directives H. List of other physicians I.  Hospitalizations, surgeries, and ER visits in previous 12 months J.  Vitals K. Screenings to include hearing, vision, cognitive, depression L. Referrals and appointments - none  In addition, I have reviewed and discussed with patient certain preventive protocols, quality metrics, and best practice recommendations. A written personalized care plan for preventive services as well as general preventive health recommendations were provided to patient.  See attached scanned questionnaire for additional information.   Signed,   Randa EvensLesia Treylen Gibbs, MHA, BS, LPN Health Coach

## 2015-11-17 NOTE — ED Notes (Signed)
The pts dinner arrived from the kitchen.  Pt sent to c-t

## 2015-11-17 NOTE — ED Provider Notes (Signed)
[facility]-EMERGENCY DEPT Provider Note   CSN: [id number] Arrival date & time: 11/17/15  1247     History   Chief Complaint Chief Complaint  Patient presents with  . Shortness of Breath    HPI [name] is a 70 y.o. male.  The history is provided by the patient. No language interpreter was used.  Shortness of Breath    [name] is a 70 y.o. male who presents to the Emergency Department complaining of SOB.  He presents from Dr. Lianne Bushy office for evaluation of shortness of breath and low oxygen sats. He was discharged from the hospital a week ago Tuesday following admission for pneumonia. Since time of discharge he reports ongoing vomiting and diarrhea but his shortness of breath has improved. No fevers, chest pain, abdominal pain.   Past Medical History:  Diagnosis Date  . Acute lower GI bleeding 12/11/2011   "first time" (12/11/2011)  . Acute myocardial infarction, unspecified site, episode of care unspecified 1995   Pt living in [place], no stent, ?PTCA  . AICD (automatic cardioverter/defibrillator) present   . Allergic rhinitis, cause unspecified   . Anxiety   . Arthritis    "all over" (11/07/2015)  . Atrial fibrillation (HCC)    a. 05/2015 - converted to sinus in setting of ICD shocks; placed on eliquis 5 bid.  [name] CAD (coronary artery disease), autologous vein bypass graft   . Cervical herniated disc    told not to lift >10 lbs  . Chronic systolic CHF (congestive heart failure), NYHA class 2 (HCC)    Reports EF of 25%.   [name] COPD (chronic obstructive pulmonary disease) (HCC) 11/2012   by xray  . Diverticulosis    by CT scan  . HCAP (healthcare-associated pneumonia) 11/06/2015  . Hypertension   . Insomnia   . Paroxysmal ventricular tachycardia (HCC)   . Perennial allergic rhinitis    only to dust mites  . Pneumonia 2000s   "walking pneumonia"  . VT (ventricular tachycardia) (HCC)    a. 05/2015 - VT storm with multiple ICD shocks-->Amio 400 BID.     Patient Active Problem List   Diagnosis Date Noted  . AICD (automatic cardioverter/defibrillator) present 11/08/2015  . HCAP (healthcare-associated pneumonia) 11/07/2015  . Acute respiratory failure with hypoxia (HCC) 11/07/2015  . Hypoxia 11/06/2015  . Fall at home 10/11/2015  . Ventricular tachycardia (HCC) 09/27/2015  . Memory loss 09/24/2015  . Encounter for screening examination for infectious disease 09/24/2015  . A-fib (HCC) 05/28/2015  . VT (ventricular tachycardia) (HCC) 05/25/2015  . Ventricular fibrillation (HCC) 05/25/2015  . VF (ventricular fibrillation) (HCC) 05/25/2015  . Syncope and collapse 05/12/2015  . Hypotension 05/12/2015  . Bradycardia 05/12/2015  . Acute non-recurrent maxillary sinusitis 03/23/2015  . Nausea with vomiting 01/05/2015  . Advance care planning 09/16/2014  . Muscle ache 09/16/2014  . Back pain 09/08/2013  . Abdominal pain, chronic, epigastric 09/08/2013  . Fatigue 06/11/2013  . Chest wall mass 11/05/2012  . Medicare annual wellness visit, subsequent 06/26/2012  . Arthritis   . Insomnia   . Anxiety   . Diverticulosis 12/11/2011  . Tobacco abuse 03/06/2011  . Ischemic cardiomyopathy 02/12/2011  . Chronic systolic CHF (congestive heart failure) (HCC) 05/10/2010  . Automatic implantable cardioverter-defibrillator in situ 08/23/2009  . Coronary atherosclerosis 10/31/2008  . Hyperlipidemia 08/25/2008  . ALLERGIC RHINITIS 08/25/2008  . COPD (chronic obstructive pulmonary disease) (HCC) 08/25/2008    Past Surgical History:  Procedure Laterality Date  . CARDIAC CATHETERIZATION  2004  LAD 30%, D1 30%, CFX-AV groove 70-80%, OM1 30%, EF 20-25%  . CARDIAC CATHETERIZATION N/A 09/28/2015   Procedure: Left Heart Cath and Coronary Angiography;  Surgeon: Peter M Swaziland, MD;  Location: [facility] INVASIVE CV LAB;  Service: Cardiovascular;  Laterality: N/A;  . CARDIAC DEFIBRILLATOR PLACEMENT  2004  . CATARACT EXTRACTION W/ INTRAOCULAR LENS IMPLANT Right  01/2012  . COLONOSCOPY  01/08/2012   Procedure: COLONOSCOPY;  Surgeon: Iva Boop, MD;  Location: [facility] ENDOSCOPY;  Service: Endoscopy;  Laterality: N/A;  . CORONARY ANGIOPLASTY  1995   Pt thinks he got a balloon, living in [place], [place]  . IMPLANTABLE CARDIOVERTER DEFIBRILLATOR GENERATOR CHANGE N/A 02/07/2012   Procedure: IMPLANTABLE CARDIOVERTER DEFIBRILLATOR GENERATOR CHANGE;  Surgeon: Marinus Maw, MD; Medtronic Evera XT VR single-chamber serial number [id number], Laterality: Left  . INSERT / REPLACE / REMOVE PACEMAKER  2004   Medtronic ICD  . KNEE ARTHROSCOPY Left 05/2003   Hattie Perch 06/19/2010  . LAPAROSCOPIC CHOLECYSTECTOMY  1/ 2012  . SHOULDER ARTHROSCOPY W/ ROTATOR CUFF REPAIR Right twice  . TONSILLECTOMY AND ADENOIDECTOMY  ~ 1951       Home Medications    Prior to Admission medications   Medication Sig Start Date End Date Taking? Authorizing Provider  amiodarone (PACERONE) 200 MG tablet Take 200 mg by mouth Mon-Fri and 400 mg on Sat and Sun 07/25/15   Marinus Maw, MD  apixaban (ELIQUIS) 5 MG TABS tablet Take 1 tablet (5 mg total) by mouth 2 (two) times daily. Resume 09/29/15 09/28/15   Marily Lente, NP  aspirin 81 MG EC tablet Take 1 tablet (81 mg total) by mouth daily. 05/28/15   Ok Anis, NP  benzonatate (TESSALON) 200 MG capsule TAKE 1 CAPSULE BY MOUTH 3 TIMES DAILY AS NEEDED FOR COUGH 10/31/14   Joaquim Nam, MD  carvedilol (COREG) 3.125 MG tablet Take 3.125 mg by mouth 2 (two) times daily with a meal.    Historical Provider, MD  diazepam (VALIUM) 5 MG tablet TAKE 1 TABLET BY MOUTH EVERY 6 HOURS AS NEEDED Patient taking differently: TAKE 1 TABLET BY MOUTH EVERY 6 HOURS AS NEEDED FOR ANXIETY 10/04/15   Joaquim Nam, MD  donepezil (ARICEPT) 10 MG tablet Take 1 tablet (10 mg total) by mouth at bedtime. 11/01/15   Joaquim Nam, MD  fexofenadine (ALLEGRA) 180 MG tablet Take 180 mg by mouth daily as needed for allergies or rhinitis.    Historical Provider, MD    furosemide (LASIX) 20 MG tablet Take 1 tablet by mouth daily as needed for fluid/ swelling    Historical Provider, MD  HYDROcodone-acetaminophen (NORCO/VICODIN) 5-325 MG tablet TAKE 1 TABLET BY MOUTH EVERY 6 HOURS AS NEEDED FOR PAIN 10/10/15   Joaquim Nam, MD  losartan (COZAAR) 25 MG tablet Take 1 tablet (25 mg total) by mouth 2 (two) times daily. 11/06/15   Joaquim Nam, MD  metaxalone (SKELAXIN) 800 MG tablet TAKE 1 TABLET BY MOUTH 3 TIMES A DAY AS NEEDED FOR PAIN. 10/10/15   Joaquim Nam, MD  Multiple Vitamins-Minerals (MULTIVITAMIN ADULTS 50+) TABS Take 1 tablet by mouth daily.    Historical Provider, MD  NITROSTAT 0.4 MG SL tablet PLACE 1 TABLET (0.4 MG TOTAL) UNDER THE TONGUE EVERY 5 (FIVE) MINUTES AS NEEDED. FOR CHEST PAIN. 06/11/15   Joaquim Nam, MD  promethazine (PHENERGAN) 25 MG tablet Take 0.5-1 tablets (12.5-25 mg total) by mouth every 8 (eight) hours as needed for nausea or vomiting. 11/14/15  Joaquim NamGraham S Duncan, MD  ranitidine (ZANTAC) 150 MG capsule Take 150 mg by mouth daily as needed for heartburn.     Historical Provider, MD  ranolazine (RANEXA) 500 MG 12 hr tablet Take 1 tablet (500 mg total) by mouth 2 (two) times daily. 09/28/15   Amber Caryl BisK Seiler, NP  traMADol (ULTRAM) 50 MG tablet TAKE 1 TO 2 TABLETS BY MOUTH EVERY 8 HOURS AS NEEDED Patient taking differently: TAKE 1 TO 2 TABLETS BY MOUTH EVERY 8 HOURS AS NEEDED FOR PAIN. 10/24/15   Joaquim NamGraham S Duncan, MD  zolpidem (AMBIEN CR) 12.5 MG CR tablet TAKE 1 TABLET BY MOUTH AT BEDTIME Patient taking differently: TAKE 1 TABLET BY MOUTH AT BEDTIME AS NEEDED FOR SLEEP 07/19/15   Joaquim NamGraham S Duncan, MD    Family History Family History  Problem Relation Age of Onset  . Diabetes Father   . Tracheal cancer Father 6973    smoker  . Stroke Mother   . Cancer Sister     left eye  . CAD Neg Hx   . Colon cancer Neg Hx   . Prostate cancer Neg Hx     Social History Social History  Substance Use Topics  . Smoking status: Former Smoker     Packs/day: 0.50    Years: 50.00    Types: Cigarettes, Cigars    Quit date: 09/27/2015  . Smokeless tobacco: Never Used  . Alcohol use No     Allergies   Ace inhibitors; Codeine; Penicillins; Lisinopril; and Statins   Review of Systems Review of Systems  Respiratory: Positive for shortness of breath.   All other systems reviewed and are negative.    Physical Exam Updated Vital Signs BP 97/58   Pulse (!) 52   Temp 97.8 F (36.6 C) (Oral)   Resp 14   Ht 5\' 3"  (1.6 m)   Wt 144 lb (65.3 kg)   SpO2 95%   BMI 25.51 kg/m   Physical Exam  Constitutional: He is oriented to person, place, and time. He appears well-developed and well-nourished.  HENT:  Head: Normocephalic and atraumatic.  Cardiovascular: Normal rate and regular rhythm.   No murmur heard. Pulmonary/Chest: Effort normal and breath sounds normal. No respiratory distress.  Abdominal: Soft. There is no tenderness. There is no rebound and no guarding.  Musculoskeletal: He exhibits no edema or tenderness.  Neurological: He is alert and oriented to person, place, and time.  Skin: Skin is warm and dry.  Psychiatric: He has a normal mood and affect. His behavior is normal.  Nursing note and vitals reviewed.    ED Treatments / Results  Labs (all labs ordered are listed, but only abnormal results are displayed) Labs Reviewed  BASIC METABOLIC PANEL - Abnormal; Notable for the following:       Result Value   Glucose, Bld 116 (*)    BUN 25 (*)    Creatinine, Ser 1.52 (*)    Calcium 8.7 (*)    GFR calc non Af Amer 44 (*)    GFR calc Af Amer 51 (*)    All other components within normal limits  CBC - Abnormal; Notable for the following:    Hemoglobin 12.4 (*)    All other components within normal limits  HEPATIC FUNCTION PANEL - Abnormal; Notable for the following:    Albumin 3.0 (*)    AST 145 (*)    ALT 116 (*)    All other components within normal limits  I-STAT CG4 LACTIC ACID,  ED - Abnormal; Notable for  the following:    Lactic Acid, Venous 2.03 (*)    All other components within normal limits  CULTURE, BLOOD (ROUTINE X 2)  CULTURE, BLOOD (ROUTINE X 2)  I-STAT TROPOININ, ED    EKG  EKG Interpretation None       Radiology Dg Chest 2 View  Result Date: 11/17/2015 CLINICAL DATA:  Chest pain.  Recent pneumonia. EXAM: CHEST  2 VIEW COMPARISON:  11/06/2015 FINDINGS: Pacer/AICD device. Midline trachea. Mild cardiomegaly. No pleural effusion or pneumothorax. Diffuse interstitial thickening. Persistent left lower lobe and developing left upper lobe airspace disease. IMPRESSION: Persistent left lower and developing left upper lobe airspace disease. This is superimposed upon cardiomegaly and diffuse interstitial prominence. Favor progressive pneumonia. Asymmetric pulmonary edema could look similar. Electronically Signed   By: Jeronimo Greaves M.D.   On: 11/17/2015 13:25    Procedures Procedures (including critical care time)  Medications Ordered in ED Medications  vancomycin (VANCOCIN) IVPB 1000 mg/200 mL premix (1,000 mg Intravenous New Bag/Given 11/17/15 1456)  vancomycin (VANCOCIN) IVPB 1000 mg/200 mL premix (not administered)  aztreonam (AZACTAM) 2 g in dextrose 5 % 50 mL IVPB (2 g Intravenous New Bag/Given 11/17/15 1450)     Initial Impression / Assessment and Plan / ED Course  I have reviewed the triage vital signs and the nursing notes.  Pertinent labs & imaging results that were available during my care of the patient were reviewed by me and considered in my medical decision making (see chart for details).  Clinical Course    Patient referred from PCPs office for evaluation of hypoxia. He has a new oxygen requirement of 2 L. He has clear lungs on examination with no respiratory distress but was recently admitted for HCAP. Chest x-ray here is concerning for persistent and worsening pneumonia and he was started on broad-spectrum antibiotics. Current clinical picture is not consistent  with sepsis or CHF exacerbation. Plan to admit for further workup and evaluation of his hypoxia with possible pneumonia.  Final Clinical Impressions(s) / ED Diagnoses   Final diagnoses:  Hypoxia    New Prescriptions New Prescriptions   No medications on file     Tilden Fossa, MD 11/17/15 1555

## 2015-11-17 NOTE — Progress Notes (Signed)
Pre visit review using our clinic review tool, if applicable. No additional management support is needed unless otherwise documented below in the visit note. 

## 2015-11-17 NOTE — Patient Instructions (Signed)
[name] , Thank you for taking time to come for your Medicare Wellness Visit. I appreciate your ongoing commitment to your health goals. Please review the following plan we discussed and let me know if I can assist you in the future.    This is a list of the screening recommended for you and due dates:  Health Maintenance  Topic Date Due  . Flu Shot  05/04/2016*  . Shingles Vaccine  09/16/2019*  . Tetanus Vaccine  02/04/2017  . Colon Cancer Screening  01/07/2022  .  Hepatitis C: One time screening is recommended by Center for Disease Control  (CDC) for  adults born from [birth year] through 1966.   Completed  . Pneumonia vaccines  Completed  *Topic was postponed. The date shown is not the original due date.   Preventive Care for Adults  A healthy lifestyle and preventive care can promote health and wellness. Preventive health guidelines for adults include the following key practices.  . A routine yearly physical is a good way to check with your health care provider about your health and preventive screening. It is a chance to share any concerns and updates on your health and to receive a thorough exam.  . Visit your dentist for a routine exam and preventive care every 6 months. Brush your teeth twice a day and floss once a day. Good oral hygiene prevents tooth decay and gum disease.  . The frequency of eye exams is based on your age, health, family medical history, use  of contact lenses, and other factors. Follow your health care provider's ecommendations for frequency of eye exams.  . Eat a healthy diet. Foods like vegetables, fruits, whole grains, low-fat dairy products, and lean protein foods contain the nutrients you need without too many calories. Decrease your intake of foods high in solid fats, added sugars, and salt. Eat the right amount of calories for you. Get information about a proper diet from your health care provider, if necessary.  . Regular physical exercise is one of the  most important things you can do for your health. Most adults should get at least 150 minutes of moderate-intensity exercise (any activity that increases your heart rate and causes you to sweat) each week. In addition, most adults need muscle-strengthening exercises on 2 or more days a week.  Silver Sneakers may be a benefit available to you. To determine eligibility, you may visit the website: www.silversneakers.com or contact program at (315)276-6731 Mon-Fri between 8AM-8PM.   . Maintain a healthy weight. The body mass index (BMI) is a screening tool to identify possible weight problems. It provides an estimate of body fat based on height and weight. Your health care provider can find your BMI and can help you achieve or maintain a healthy weight.   For adults 20 years and older: ? A BMI below 18.5 is considered underweight. ? A BMI of 18.5 to 24.9 is normal. ? A BMI of 25 to 29.9 is considered overweight. ? A BMI of 30 and above is considered obese.   . Maintain normal blood lipids and cholesterol levels by exercising and minimizing your intake of saturated fat. Eat a balanced diet with plenty of fruit and vegetables. Blood tests for lipids and cholesterol should begin at age 25 and be repeated every 5 years. If your lipid or cholesterol levels are high, you are over 50, or you are at high risk for heart disease, you may need your cholesterol levels checked more frequently. Ongoing high lipid  and cholesterol levels should be treated with medicines if diet and exercise are not working.  . If you smoke, find out from your health care provider how to quit. If you do not use tobacco, please do not start.  . If you choose to drink alcohol, please do not consume more than 2 drinks per day. One drink is considered to be 12 ounces (355 mL) of beer, 5 ounces (148 mL) of wine, or 1.5 ounces (44 mL) of liquor.  . If you are 25-59 years old, ask your health care provider if you should take aspirin to  prevent strokes.  . Use sunscreen. Apply sunscreen liberally and repeatedly throughout the day. You should seek shade when your shadow is shorter than you. Protect yourself by wearing long sleeves, pants, a wide-brimmed hat, and sunglasses year round, whenever you are outdoors.  . Once a month, do a whole body skin exam, using a mirror to look at the skin on your back. Tell your health care provider of new moles, moles that have irregular borders, moles that are larger than a pencil eraser, or moles that have changed in shape or color.

## 2015-11-17 NOTE — ED Notes (Signed)
[place] sandwich given  Meal ordered

## 2015-11-17 NOTE — ED Triage Notes (Signed)
[facility]- pt here from PCP with recent PNA. Pt reported shortness of breath at PCP. Pt 80's% on room air, improved to 98% on 4L. Vitals stable with EMS. Hx of COPD and anxiety. IV in place.

## 2015-11-18 LAB — BASIC METABOLIC PANEL
Anion gap: 7 (ref 5–15)
BUN: 22 mg/dL — AB (ref 6–20)
CHLORIDE: 105 mmol/L (ref 101–111)
CO2: 26 mmol/L (ref 22–32)
Calcium: 8 mg/dL — ABNORMAL LOW (ref 8.9–10.3)
Creatinine, Ser: 1.25 mg/dL — ABNORMAL HIGH (ref 0.61–1.24)
GFR calc Af Amer: >60 mL/min (ref >60)
GFR calc non Af Amer: 56 mL/min — ABNORMAL LOW (ref >60)
GLUCOSE: 111 mg/dL — AB (ref 65–99)
POTASSIUM: 4 mmol/L (ref 3.5–5.1)
Sodium: 138 mmol/L (ref 135–145)

## 2015-11-18 LAB — STREP PNEUMONIAE URINARY ANTIGEN: Strep Pneumo Urinary Antigen: NEGATIVE

## 2015-11-18 MED ORDER — LOPERAMIDE HCL 2 MG PO CAPS
2.0000 mg | ORAL_CAPSULE | ORAL | Status: DC | PRN
  Administered 2015-11-19 – 2015-11-20 (×3): 2 mg via ORAL
  Filled 2015-11-18 (×4): qty 1

## 2015-11-18 NOTE — Progress Notes (Signed)
Patient ID: GAEGE SANGALANG, male   DOB: 22-Jan-1946, 70 y.o.   MRN: [id number]    PROGRESS NOTE    [name]  [id number]:[id number] DOB: 1945/12/31 DOA: 11/17/2015  PCP: Crawford Givens, MD   Brief Narrative:  70 y.o.malewith medical history significant of COPD, GERD, anxiety, bradycardia, s/p of AICD, sCHF (no 2D echo on record), A fib on Eliquis, CAD, s/p of CABG, recently discharged on Oct 4th, 2017 after being treated for LLL CAP and who nowpresents from PCP office where he was seen earlier today and found to be hypoxic. Pt was referred for an admission but is very clear he has not experienced any dyspnea, chest pain, fevers, chills and actually says he has felt bit better since his recent discharged. He does mention he was discharged on doxycycline on last discharge but did not tolerate medicine well and was throwing up and having some diarrhea, this has resolved since he has completed ABX.   In ED, pt noted to be hypoxic on RA in 80's and requiring [place] 2-3 L which was improved the oxygen sats to low 90's. VS notable for mild bradycardia with HR in 50's otherwise stable. Blood work unremarkable.   Hospital Course:  Acute respiratory failure with hypoxia (HCC) due to multifocal HCAP - CXR on this admission with persistent left lower and developing left upper lobe airspace disease, consistent with worsening PNA or asymmetric pulmonary edema - CT chest conforms multi focal infiltrate involving the lower lobes, L > R, and LUL - Recommend short-term follow-up to ensure resolution.  - also noted 4 mm nodule in the right upper lobe. Recommend attention on short-term follow-up as pt has history of smoking  - continue broad spectrum ABX day #2, follow up on sputum cultures  - [provide antitussives and BD if needed  - IS while awake   AICD for V tach - was admitted last month due to AICD firing - no cause found- cont Amiodarone  - keep on telemetry for now  Coronary atherosclerosis - s/p  of CABG, no CP - Continue aspirin, Coreg, Ranexa and prn NTG  Bradycardia - hold BB and donepezil until HR stabilizes as both medications known to cause bradycardia, HR still in 50's  COPD (chronic obstructive pulmonary disease) (HCC) - with no acute exacerbation   Chronic systolic CHF (congestive heart failure) (HCC) - No 2-D echo on record. No leg edema or JVD. CHF is compensated - hold Coreg, cozaar SBP is on low end of normal in 90's, one BP check this AM with SBP in 120's - will keep close eye on the vital signs   Acute kidney injury - from poor oral intake and recent diarrhea and vomiting which have since resolved - Cr trending down from 1.5 --> 1.25 - BMP in AM  Transaminitis - unclear cause - will repeat test in AM, ? amio   GERD - continue home regimen with Pepcid   Atrial Fibrillation - CHA2DS2-VASc Scoreis 3, needs oral anticoagulation.  - Patient is on Eliquis at home. Heart rate is well controlled. - continue Eliquis andamiodarone  Anxiety - Continue home medications: Valium  DVT prophylaxis: On Eliquis  Code Status: Full  Family Communication: Patient at bedside  Disposition Plan: Home in 2-3 days when off IV ABX   Consultants:   None  Procedures:   None  Antimicrobials:   Vancomycin 10/13 -->   Aztreonam 10/13 -->   Subjective: Reports feeling better but still requiring oxygen for exertional dyspnea.  Objective: Vitals:   11/17/15 2032 11/18/15 0153 11/18/15 0540 11/18/15 1014  BP: (!) 87/50 (!) 98/59 (!) 108/58 (!) 93/43  Pulse: (!) 50 (!) 49 (!) 52 (!) 55  Resp: 17 18 18    Temp: 98.1 F (36.7 C) 97.7 F (36.5 C) 97.8 F (36.6 C) 98.1 F (36.7 C)  TempSrc: Oral Oral Oral Oral  SpO2: 92% 95% 94% 98%  Weight: 64.7 kg (142 lb 9.6 oz)  65 kg (143 lb 3.2 oz)   Height: 5\' 3"  (1.6 m)       Intake/Output Summary (Last 24 hours) at 11/18/15 1122 Last data filed at 11/18/15 0805  Gross per 24 hour  Intake             2060 ml   Output              200 ml  Net             1860 ml   Filed Weights   11/17/15 1249 11/17/15 2032 11/18/15 0540  Weight: 65.3 kg (144 lb) 64.7 kg (142 lb 9.6 oz) 65 kg (143 lb 3.2 oz)    Examination:  General exam: Appears calm and comfortable  Respiratory system: Diminished breath sounds at bases with rhonchi bilaterally  Cardiovascular system: bradycardia, No JVD, murmurs, rubs, gallops or clicks. No pedal edema. Gastrointestinal system: Abdomen is nondistended, soft and nontender. No organomegaly or masses felt. Normal bowel sounds heard. Central nervous system: Alert and oriented. No focal neurological deficits.  Data Reviewed: I have personally reviewed following labs and imaging studies  CBC:  Recent Labs Lab 11/17/15 1257  WBC 9.6  HGB 12.4*  HCT 39.2  MCV 89.9  PLT 341   Basic Metabolic Panel:  Recent Labs Lab 11/17/15 1257 11/18/15 0550  NA 139 138  K 4.3 4.0  CL 105 105  CO2 26 26  GLUCOSE 116* 111*  BUN 25* 22*  CREATININE 1.52* 1.25*  CALCIUM 8.7* 8.0*   Liver Function Tests:  Recent Labs Lab 11/17/15 1312  AST 145*  ALT 116*  ALKPHOS 68  BILITOT 1.2  PROT 6.5  ALBUMIN 3.0*   Radiology Studies: Dg Chest 2 View  Result Date: 11/17/2015 CLINICAL DATA:  Chest pain.  Recent pneumonia. EXAM: CHEST  2 VIEW COMPARISON:  11/06/2015 FINDINGS: Pacer/AICD device. Midline trachea. Mild cardiomegaly. No pleural effusion or pneumothorax. Diffuse interstitial thickening. Persistent left lower lobe and developing left upper lobe airspace disease. IMPRESSION: Persistent left lower and developing left upper lobe airspace disease. This is superimposed upon cardiomegaly and diffuse interstitial prominence. Favor progressive pneumonia. Asymmetric pulmonary edema could look similar. Electronically Signed   By: Jeronimo GreavesKyle  Talbot M.D.   On: 11/17/2015 13:25   Ct Chest Wo Contrast  Result Date: 11/17/2015 CLINICAL DATA:  Hypoxia.  Former smoker. EXAM: CT CHEST WITHOUT  CONTRAST TECHNIQUE: Multidetector CT imaging of the chest was performed following the standard protocol without IV contrast. COMPARISON:  CT chest August 02, 2008 and recent chest x-rays. FINDINGS: Cardiovascular: A pacemaker device is seen. There are coronary artery calcifications on the left. Cardiomegaly is noted. No effusions. Mediastinum/Nodes: Small nodules in the thyroid of doubtful significance. The thoracic aorta is normal in caliber. It is also mildly tortuous with minimal atherosclerotic change. The central pulmonary arteries are normal. The visualized esophagus is unremarkable. A calcified node is seen in the right hilum on image 31 of series 2. A few calcified right hilar nodes are seen as well. A single mildly prominent right  paratracheal node measures 12 mm likely reactive. A few other shotty nodes are identified. Lungs/Pleura: Mild opacity is seen in the right lung base. Opacity is also seen in the left upper lobe, primarily medially. More focal opacity is seen in the left lung base with a rounded region of opacity seen on axial image 90, series 5. Mild emphysematous changes. Tiny irregular nodule in the right upper lobe on series 5, image 53 measuring 4 mm. Evaluation for nodules is otherwise limited due the pulmonary opacities but no other nodules or masses are noted. Upper Abdomen: Previous cholecystectomy. No other acute abnormalities in the upper abdomen. Musculoskeletal: Degenerative changes in the spine. No other acute bony abnormalities. IMPRESSION: 1. Multi focal infiltrate involving the lower lobes, left greater than right, and the left upper lobe is most likely an infectious or inflammatory process. Recommend short-term follow-up to ensure resolution. 2. 4 mm nodule in the right upper lobe. Recommend attention on short-term follow-up. Electronically Signed   By: Gerome Sam III M.D   On: 11/17/2015 19:28      Scheduled Meds: . apixaban  5 mg Oral BID  . aspirin EC  81 mg Oral  Daily  . aztreonam  2 g Intravenous Q8H  . famotidine  10 mg Oral BID  . loratadine  10 mg Oral Daily  . multivitamin with minerals  1 tablet Oral Daily  . ranolazine  500 mg Oral BID  . vancomycin  1,000 mg Intravenous Q24H   Continuous Infusions:    LOS: 1 day    Time spent: 20 minutes    Randy Presto, MD [facility] Pager [phone]  If 7PM-7AM, please contact night-coverage [email or web address] Password [email or web address] 11/18/2015, 11:22 AM

## 2015-11-19 ENCOUNTER — Other Ambulatory Visit: Payer: Self-pay | Admitting: Family Medicine

## 2015-11-19 LAB — CBC
HCT: 36.5 % — ABNORMAL LOW (ref 39.0–52.0)
Hemoglobin: 11.3 g/dL — ABNORMAL LOW (ref 13.0–17.0)
MCH: 28.2 pg (ref 26.0–34.0)
MCHC: 31 g/dL (ref 30.0–36.0)
MCV: 91 fL (ref 78.0–100.0)
PLATELETS: 289 10*3/uL (ref 150–400)
RBC: 4.01 MIL/uL — ABNORMAL LOW (ref 4.22–5.81)
RDW: 15 % (ref 11.5–15.5)
WBC: 7.3 10*3/uL (ref 4.0–10.5)

## 2015-11-19 LAB — COMPREHENSIVE METABOLIC PANEL
ALT: 113 U/L — AB (ref 17–63)
AST: 120 U/L — AB (ref 15–41)
Albumin: 2.2 g/dL — ABNORMAL LOW (ref 3.5–5.0)
Alkaline Phosphatase: 55 U/L (ref 38–126)
Anion gap: 6 (ref 5–15)
BILIRUBIN TOTAL: 0.8 mg/dL (ref 0.3–1.2)
BUN: 17 mg/dL (ref 6–20)
CO2: 27 mmol/L (ref 22–32)
CREATININE: 1 mg/dL (ref 0.61–1.24)
Calcium: 7.9 mg/dL — ABNORMAL LOW (ref 8.9–10.3)
Chloride: 107 mmol/L (ref 101–111)
GFR calc Af Amer: >60 mL/min (ref >60)
Glucose, Bld: 117 mg/dL — ABNORMAL HIGH (ref 65–99)
POTASSIUM: 4.1 mmol/L (ref 3.5–5.1)
Sodium: 140 mmol/L (ref 135–145)
TOTAL PROTEIN: 5.3 g/dL — AB (ref 6.5–8.1)

## 2015-11-19 MED ORDER — GUAIFENESIN ER 600 MG PO TB12
600.0000 mg | ORAL_TABLET | Freq: Two times a day (BID) | ORAL | Status: DC
  Administered 2015-11-19 – 2015-11-21 (×5): 600 mg via ORAL
  Filled 2015-11-19 (×5): qty 1

## 2015-11-19 MED ORDER — GUAIFENESIN-DM 100-10 MG/5ML PO SYRP: 5.0000 mL | ORAL_SOLUTION | ORAL | Status: DC | PRN

## 2015-11-19 NOTE — Progress Notes (Signed)
Patient ID: [name], male   DOB: 1945-10-01, 70 y.o.   MRN: [id number]    PROGRESS NOTE    GARRETH FLATTEN  [id number] DOB: 03-15-1945 DOA: 11/17/2015  PCP: Crawford Givens, MD   Brief Narrative:  70 y.o.malewith medical history significant of COPD, GERD, anxiety, bradycardia, s/p of AICD, sCHF (no 2D echo on record), A fib on Eliquis, CAD, s/p of CABG, recently discharged on Oct 4th, 2017 after being treated for LLL CAP and who nowpresents from PCP office where he was seen earlier today and found to be hypoxic. Pt was referred for an admission but is very clear he has not experienced any dyspnea, chest pain, fevers, chills and actually says he has felt bit better since his recent discharged. He does mention he was discharged on doxycycline on last discharge but did not tolerate medicine well and was throwing up and having some diarrhea, this has resolved since he has completed ABX.   In ED, pt noted to be hypoxic on RA in 80's and requiring [place] 2-3 L which was improved the oxygen sats to low 90's. VS notable for mild bradycardia with HR in 50's otherwise stable. Blood work unremarkable.   Hospital Course:  Acute respiratory failure with hypoxia (HCC) due to multifocal HCAP - CXR on this admission with persistent left lower and developing left upper lobe airspace disease, consistent with worsening PNA or asymmetric pulmonary edema - CT chest conforms multi focal infiltrate involving the lower lobes, L > R, and LUL - Recommend short-term follow-up to ensure resolution.  - also noted 4 mm nodule in the right upper lobe. Recommend attention on short-term follow-up as pt has history of smoking  - continue broad spectrum ABX day #3, follow up on sputum cultures  - [provide antitussives and BD if needed  - IS while awake  - ambulate   AICD for V tach - was admitted last month due to AICD firing - no cause found- cont Amiodarone  - keep on telemetry for now - no chest pain    Coronary atherosclerosis - s/p of CABG, no CP - Continue aspirin, Coreg, Ranexa and prn NTG  Bradycardia - continue to hold BB and donepezil until HR stabilizes as both medications known to cause bradycardia, HR still in 50's  COPD (chronic obstructive pulmonary disease) (HCC) - with no acute exacerbation   Chronic systolic CHF (congestive heart failure) (HCC) - No 2-D echo on record. No leg edema or JVD. CHF is compensated - hold Coreg, cozaar SBP is on low end of normal in 90's, one BP check this AM with SBP in 120's - will keep close eye on the vital signs   Acute kidney injury - from poor oral intake and recent diarrhea and vomiting which have since resolved - Cr trending down from 1.5 --> 1.25 --> 1.00 - BMP in AM  Transaminitis - unclear cause, ? amio - trending down   GERD - continue home regimen with Pepcid   Atrial Fibrillation - CHA2DS2-VASc Scoreis 3, needs oral anticoagulation.  - Patient is on Eliquis at home. Heart rate is well controlled. - continue Eliquis andamiodarone  Anxiety - Continue home medications: Valium  DVT prophylaxis: On Eliquis  Code Status: Full  Family Communication: Patient at bedside  Disposition Plan: Home in 1-2 days when off IV ABX   Consultants:   None  Procedures:   None  Antimicrobials:   Vancomycin 10/13 -->   Aztreonam 10/13 -->   Subjective: Reports feeling  better but still requiring oxygen for exertional dyspnea.   Objective: Vitals:   11/18/15 1124 11/18/15 2034 11/19/15 0507 11/19/15 1205  BP: 129/72 (!) 104/57 115/73 99/67  Pulse: 60 (!) 57 (!) 52 (!) 58  Resp: 18 18 18 18   Temp: 97.2 F (36.2 C) 97.5 F (36.4 C) 97.4 F (36.3 C) 98.3 F (36.8 C)  TempSrc: Oral Oral Oral Oral  SpO2: 90% 95% 92% 92%  Weight:   64.6 kg (142 lb 8 oz)   Height:        Intake/Output Summary (Last 24 hours) at 11/19/15 1330 Last data filed at 11/19/15 0900  Gross per 24 hour  Intake              540 ml   Output              500 ml  Net               40 ml   Filed Weights   11/17/15 2032 11/18/15 0540 11/19/15 0507  Weight: 64.7 kg (142 lb 9.6 oz) 65 kg (143 lb 3.2 oz) 64.6 kg (142 lb 8 oz)    Examination:  General exam: Appears calm and comfortable  Respiratory system: Diminished breath sounds at bases with rhonchi bilaterally  Cardiovascular system: bradycardia, No JVD, murmurs, rubs, gallops or clicks. No pedal edema. Gastrointestinal system: Abdomen is nondistended, soft and nontender. No organomegaly or masses felt. Normal bowel sounds heard. Central nervous system: Alert and oriented. No focal neurological deficits.  Data Reviewed: I have personally reviewed following labs and imaging studies  CBC:  Recent Labs Lab 11/17/15 1257 11/19/15 0458  WBC 9.6 7.3  HGB 12.4* 11.3*  HCT 39.2 36.5*  MCV 89.9 91.0  PLT 341 289   Basic Metabolic Panel:  Recent Labs Lab 11/17/15 1257 11/18/15 0550 11/19/15 0458  NA 139 138 140  K 4.3 4.0 4.1  CL 105 105 107  CO2 26 26 27   GLUCOSE 116* 111* 117*  BUN 25* 22* 17  CREATININE 1.52* 1.25* 1.00  CALCIUM 8.7* 8.0* 7.9*   Liver Function Tests:  Recent Labs Lab 11/17/15 1312 11/19/15 0458  AST 145* 120*  ALT 116* 113*  ALKPHOS 68 55  BILITOT 1.2 0.8  PROT 6.5 5.3*  ALBUMIN 3.0* 2.2*   Radiology Studies: Ct Chest Wo Contrast  Result Date: 11/17/2015 CLINICAL DATA:  Hypoxia.  Former smoker. EXAM: CT CHEST WITHOUT CONTRAST TECHNIQUE: Multidetector CT imaging of the chest was performed following the standard protocol without IV contrast. COMPARISON:  CT chest August 02, 2008 and recent chest x-rays. FINDINGS: Cardiovascular: A pacemaker device is seen. There are coronary artery calcifications on the left. Cardiomegaly is noted. No effusions. Mediastinum/Nodes: Small nodules in the thyroid of doubtful significance. The thoracic aorta is normal in caliber. It is also mildly tortuous with minimal atherosclerotic change. The  central pulmonary arteries are normal. The visualized esophagus is unremarkable. A calcified node is seen in the right hilum on image 31 of series 2. A few calcified right hilar nodes are seen as well. A single mildly prominent right paratracheal node measures 12 mm likely reactive. A few other shotty nodes are identified. Lungs/Pleura: Mild opacity is seen in the right lung base. Opacity is also seen in the left upper lobe, primarily medially. More focal opacity is seen in the left lung base with a rounded region of opacity seen on axial image 90, series 5. Mild emphysematous changes. Tiny irregular nodule in the right  upper lobe on series 5, image 53 measuring 4 mm. Evaluation for nodules is otherwise limited due the pulmonary opacities but no other nodules or masses are noted. Upper Abdomen: Previous cholecystectomy. No other acute abnormalities in the upper abdomen. Musculoskeletal: Degenerative changes in the spine. No other acute bony abnormalities. IMPRESSION: 1. Multi focal infiltrate involving the lower lobes, left greater than right, and the left upper lobe is most likely an infectious or inflammatory process. Recommend short-term follow-up to ensure resolution. 2. 4 mm nodule in the right upper lobe. Recommend attention on short-term follow-up. Electronically Signed   By: Gerome Samavid  Williams III M.D   On: 11/17/2015 19:28      Scheduled Meds: . apixaban  5 mg Oral BID  . aspirin EC  81 mg Oral Daily  . aztreonam  2 g Intravenous Q8H  . famotidine  10 mg Oral BID  . guaiFENesin  600 mg Oral BID  . loratadine  10 mg Oral Daily  . multivitamin with minerals  1 tablet Oral Daily  . ranolazine  500 mg Oral BID  . vancomycin  1,000 mg Intravenous Q24H   Continuous Infusions:    LOS: 2 days    Time spent: 20 minutes    Debbora PrestoMAGICK-Kierrah Kilbride, MD [facility] Pager [phone]  If 7PM-7AM, please contact night-coverage [email or web address] Password [email or web address] 11/19/2015, 1:30 PM

## 2015-11-19 NOTE — Progress Notes (Signed)
PT demonstrated verbal and hands on understanding of Flutter device. 

## 2015-11-20 ENCOUNTER — Other Ambulatory Visit: Payer: Self-pay

## 2015-11-20 LAB — CBC
HCT: 35 % — ABNORMAL LOW (ref 39.0–52.0)
HEMOGLOBIN: 11 g/dL — AB (ref 13.0–17.0)
MCH: 28.1 pg (ref 26.0–34.0)
MCHC: 31.4 g/dL (ref 30.0–36.0)
MCV: 89.5 fL (ref 78.0–100.0)
Platelets: 290 10*3/uL (ref 150–400)
RBC: 3.91 MIL/uL — ABNORMAL LOW (ref 4.22–5.81)
RDW: 14.9 % (ref 11.5–15.5)
WBC: 9.3 10*3/uL (ref 4.0–10.5)

## 2015-11-20 LAB — BASIC METABOLIC PANEL
Anion gap: 6 (ref 5–15)
BUN: 15 mg/dL (ref 6–20)
CALCIUM: 7.9 mg/dL — AB (ref 8.9–10.3)
CO2: 27 mmol/L (ref 22–32)
CREATININE: 0.85 mg/dL (ref 0.61–1.24)
Chloride: 105 mmol/L (ref 101–111)
GFR calc non Af Amer: >60 mL/min (ref >60)
Glucose, Bld: 103 mg/dL — ABNORMAL HIGH (ref 65–99)
Potassium: 4.2 mmol/L (ref 3.5–5.1)
SODIUM: 138 mmol/L (ref 135–145)

## 2015-11-20 MED ORDER — LEVOFLOXACIN 750 MG PO TABS
750.0000 mg | ORAL_TABLET | Freq: Every day | ORAL | Status: DC
  Administered 2015-11-20 – 2015-11-21 (×2): 750 mg via ORAL
  Filled 2015-11-20 (×2): qty 1

## 2015-11-20 NOTE — Progress Notes (Signed)
Pharmacy Antibiotic Note  [name] is a 70 y.o. male admitted on 11/17/2015 with pneumonia.  Pharmacy has been consulted to transition from vancomycin/aztreonam to levofloxacin PO - total antibiotics day #4. Pt is afebrile and WBC WNL. Culture are negative to date. CrCl~64.  Plan: - D/c vancomycin/aztreonam - Start levofloxacin 750mg  PO q24h - F/u renal fxn, C&S, clinical status  Height: 5\' 3"  (160 cm) Weight: 146 lb (66.2 kg) IBW/kg (Calculated) : 56.9  Temp (24hrs), Avg:98.3 F (36.8 C), Min:98 F (36.7 C), Max:98.6 F (37 C)   Recent Labs Lab 11/17/15 1257 11/17/15 1455 11/18/15 0550 11/19/15 0458 11/20/15 0335  WBC 9.6  --   --  7.3 9.3  CREATININE 1.52*  --  1.25* 1.00 0.85  LATICACIDVEN  --  2.03*  --   --   --     Estimated Creatinine Clearance: 64.2 mL/min (by C-G formula based on SCr of 0.85 mg/dL).    Allergies  Allergen Reactions  . Ace Inhibitors Other (See Comments)    muscle pain. Tolerates ARBs.   . Codeine Other (See Comments)    "head wants to explode."  . Doxycycline Diarrhea and Nausea And Vomiting  . Penicillins Swelling    "started at point of injection; w/in 3 min my upper arm was swollen 3 times normal" Has patient had a PCN reaction causing immediate rash, facial/tongue/throat swelling, SOB or lightheadedness with hypotension: Yes Has patient had a PCN reaction causing severe rash involving mucus membranes or skin necrosis: No Has patient had a PCN reaction that required hospitalization No Has patient had a PCN reaction occurring within the last 10 years: No If all of the above answers are "NO", then may proceed wi  . Lisinopril     Muscle Pain  . Statins Other (See Comments)    Myalgias per patient    Antimicrobials this admission: Vanc 10/13>>10/16 Aztreo 10/13>>10/16 Levofloxacin PO 10/16>>  Dose adjustments this admission: N/A  Microbiology results: 10/13 blood cx: ngtd  Babs Bertin, PharmD, BCPS Clinical  Pharmacist 11/20/2015 11:20 AM

## 2015-11-20 NOTE — Progress Notes (Signed)
Patient ID: [name], male   DOB: 10-01-1945, 70 y.o.   MRN: [id number]    PROGRESS NOTE    [name]  NWG:[id number] DOB: 10-01-1945 DOA: 11/17/2015  PCP: Crawford GivensGraham Duncan, MD   Brief Narrative:  70 y.o.malewith medical history significant of COPD, GERD, anxiety, bradycardia, s/p of AICD, sCHF (no 2D echo on record), A fib on Eliquis, CAD, s/p of CABG, recently discharged on Oct 4th, 2017 after being treated for LLL CAP and who nowpresents from PCP office where he was seen earlier today and found to be hypoxic. Pt was referred for an admission but is very clear he has not experienced any dyspnea, chest pain, fevers, chills and actually says he has felt bit better since his recent discharged. He does mention he was discharged on doxycycline on last discharge but did not tolerate medicine well and was throwing up and having some diarrhea, this has resolved since he has completed ABX.   In ED, pt noted to be hypoxic on RA in 80's and requiring Olar 2-3 L which was improved the oxygen sats to low 90's. VS notable for mild bradycardia with HR in 50's otherwise stable. Blood work unremarkable.   Hospital Course:  Acute respiratory failure with hypoxia (HCC) due to multifocal HCAP - CXR on this admission with persistent left lower and developing left upper lobe airspace disease, consistent with worsening PNA or asymmetric pulmonary edema - CT chest conforms multi focal infiltrate involving the lower lobes, L > R, and LUL - Recommend short-term follow-up to ensure resolution.  - also noted 4 mm nodule in the right upper lobe. Recommend attention on short-term follow-up as pt has history of smoking  - continued broad spectrum ABX day #4, will change to oral Levaquin today and see how pt does  - [provide antitussives and BD if needed  - IS while awake  - ambulate  - possible d/c in AM if pt tolerating PO Levaquin   AICD for V tach - was admitted last month due to AICD firing - no  cause found- cont Amiodarone  - ok to d/c tele for now  - no chest pain   Coronary atherosclerosis - s/p of CABG, no CP - Continue aspirin, Ranexa and prn NTG  Bradycardia - continue to hold BB and donepezil until HR stabilizes as both medications known to cause bradycardia, HR still in 50's  COPD (chronic obstructive pulmonary disease) (HCC) - with no acute exacerbation   Chronic systolic CHF (congestive heart failure) (HCC) - No 2-D echo on record. No leg edema or JVD. CHF is compensated - hold Coreg, cozaar SBP is on low end of normal in 90's, one BP check this AM with SBP in 120's - will keep close eye on the vital signs   Acute kidney injury - from poor oral intake and recent diarrhea and vomiting which have since resolved - Cr trending down from 1.5 --> 1.25 --> 1.00 --> 0.85  Transaminitis - unclear cause, ? amio - trending down   GERD - continue home regimen with Pepcid   Atrial Fibrillation - CHA2DS2-VASc Scoreis 3, needs oral anticoagulation.  - Patient is on Eliquis at home. Heart rate is well controlled. - continue Eliquis andamiodarone  Anxiety - Continue home medications: Valium  DVT prophylaxis: On Eliquis  Code Status: Full  Family Communication: Patient at bedside  Disposition Plan: Home in am if pt tolerating PO ABX, it would be risky to send pt home today as on previous  admission he was sent on PO ABX and did not tolerate it well so I would like to be sure he is tolerating PO ABX prior to discharge   Consultants:   None  Procedures:   None  Antimicrobials:   Vancomycin 10/13 --> 10/16  Aztreonam 10/13 --> 10/16  Levaquin 10/16 -->  Subjective: Reports feeling better but still requiring oxygen for exertional dyspnea.   Objective: Vitals:   11/19/15 1205 11/19/15 2127 11/20/15 0700 11/20/15 1032  BP: 99/67 (!) 99/57 98/60   Pulse: (!) 58 (!) 56 60   Resp: 18 18 19    Temp: 98.3 F (36.8 C) 98.6 F (37 C) 98 F (36.7 C)     TempSrc: Oral Oral Oral   SpO2: 92% 92% 93% 94%  Weight:   66.2 kg (146 lb)   Height:        Intake/Output Summary (Last 24 hours) at 11/20/15 1119 Last data filed at 11/20/15 0901  Gross per 24 hour  Intake             1090 ml  Output             1050 ml  Net               40 ml   Filed Weights   11/18/15 0540 11/19/15 0507 11/20/15 0700  Weight: 65 kg (143 lb 3.2 oz) 64.6 kg (142 lb 8 oz) 66.2 kg (146 lb)    Examination:  General exam: Appears calm and comfortable  Respiratory system: Diminished breath sounds at bases, better air movement bilaterally  Cardiovascular system: bradycardia, No JVD, murmurs, rubs, gallops or clicks. No pedal edema. Gastrointestinal system: Abdomen is nondistended, soft and nontender. No organomegaly or masses felt. Normal bowel sounds heard. Central nervous system: Alert and oriented. No focal neurological deficits.  Data Reviewed: I have personally reviewed following labs and imaging studies  CBC:  Recent Labs Lab 11/17/15 1257 11/19/15 0458 11/20/15 0335  WBC 9.6 7.3 9.3  HGB 12.4* 11.3* 11.0*  HCT 39.2 36.5* 35.0*  MCV 89.9 91.0 89.5  PLT 341 289 290   Basic Metabolic Panel:  Recent Labs Lab 11/17/15 1257 11/18/15 0550 11/19/15 0458 11/20/15 0335  NA 139 138 140 138  K 4.3 4.0 4.1 4.2  CL 105 105 107 105  CO2 26 26 27 27   GLUCOSE 116* 111* 117* 103*  BUN 25* 22* 17 15  CREATININE 1.52* 1.25* 1.00 0.85  CALCIUM 8.7* 8.0* 7.9* 7.9*   Liver Function Tests:  Recent Labs Lab 11/17/15 1312 11/19/15 0458  AST 145* 120*  ALT 116* 113*  ALKPHOS 68 55  BILITOT 1.2 0.8  PROT 6.5 5.3*  ALBUMIN 3.0* 2.2*   Radiology Studies: No results found.    Scheduled Meds: . apixaban  5 mg Oral BID  . aspirin EC  81 mg Oral Daily  . famotidine  10 mg Oral BID  . guaiFENesin  600 mg Oral BID  . loratadine  10 mg Oral Daily  . multivitamin with minerals  1 tablet Oral Daily  . ranolazine  500 mg Oral BID   Continuous  Infusions:    LOS: 3 days    Time spent: 20 minutes    Debbora Presto, MD [facility] Pager [phone]  If 7PM-7AM, please contact night-coverage [email or web address] Password [email or web address] 11/20/2015, 11:19 AM

## 2015-11-20 NOTE — Progress Notes (Signed)
Pharmacy Antibiotic Note  [name] is a 70 y.o. male admitted on 11/17/2015 with pneumonia.  Pharmacy has been consulted for vancomycin dosing. Also on aztreonam per MD - antibiotics day #4. Pt is afebrile and WBC WNL. Culture are negative to date. CrCl~64.  Plan: - Vanc 1g IV q24h - d/c soon? - Aztreonam 2g IV q8h - F/u renal fxn, C&S, clinical status, VT as indicated  Height: 5\' 3"  (160 cm) Weight: 146 lb (66.2 kg) IBW/kg (Calculated) : 56.9  Temp (24hrs), Avg:98.3 F (36.8 C), Min:98 F (36.7 C), Max:98.6 F (37 C)   Recent Labs Lab 11/17/15 1257 11/17/15 1455 11/18/15 0550 11/19/15 0458 11/20/15 0335  WBC 9.6  --   --  7.3 9.3  CREATININE 1.52*  --  1.25* 1.00 0.85  LATICACIDVEN  --  2.03*  --   --   --     Estimated Creatinine Clearance: 64.2 mL/min (by C-G formula based on SCr of 0.85 mg/dL).    Allergies  Allergen Reactions  . Ace Inhibitors Other (See Comments)    muscle pain. Tolerates ARBs.   . Codeine Other (See Comments)    "head wants to explode."  . Doxycycline Diarrhea and Nausea And Vomiting  . Penicillins Swelling    "started at point of injection; w/in 3 min my upper arm was swollen 3 times normal" Has patient had a PCN reaction causing immediate rash, facial/tongue/throat swelling, SOB or lightheadedness with hypotension: Yes Has patient had a PCN reaction causing severe rash involving mucus membranes or skin necrosis: No Has patient had a PCN reaction that required hospitalization No Has patient had a PCN reaction occurring within the last 10 years: No If all of the above answers are "NO", then may proceed wi  . Lisinopril     Muscle Pain  . Statins Other (See Comments)    Myalgias per patient    Antimicrobials this admission: Vanc 10/13>> Aztreo 10/13>>  Dose adjustments this admission: N/A  Microbiology results: 10/13 blood cx: ngtd  Babs Bertin, PharmD, BCPS Clinical Pharmacist 11/20/2015 10:35 AM

## 2015-11-20 NOTE — Plan of Care (Signed)
Problem: Acute Rehab PT Goals(only PT should resolve) Goal: Pt Will Ambulate With O2 sats 90% or greater Goal: Pt Will Go Up/Down Stairs With O2 sats 90% or greater

## 2015-11-20 NOTE — Consult Note (Signed)
   [facility] CM Inpatient Consult   11/20/2015  [name] [birth year] [id number]   Patient evaluated for community based chronic disease management services with [facility] Care Management Program as a benefit of patient's Medicare Insurance.  Chart review reveals the patient, [name] is a 70 y.o. male re-admitted on 11/17/2015 with pneumonia on antibiotics.   Spoke with patient and wife at bedside to explain [facility] Care Management services. Consent form signed. Patient will receive post hospital discharge call and will be evaluated for monthly home visits for assessments and disease process education.  Left contact information and [facility] literature at bedside. Made Inpatient Case Manager aware that [facility] Care Management following. Of note, [facility] Care Management services does not replace or interfere with any services that are arranged by inpatient case management or social work.  For additional questions or referrals please contact:    Charlesetta Shanks, RN BSN CCM [facility]  [phone] business mobile phone Toll free office [phone]

## 2015-11-20 NOTE — Evaluation (Signed)
Physical Therapy Evaluation Patient Details Name: [name] MRN: [id number] DOB: 06-Dec-1945 Today's Date: 11/20/2015   History of Present Illness  70 yo male with extremely thin body habitus was referred to PT after having second admission with HCAP.  He is hypoxic at baseline and desats even on O2 down to 75% with gait.  AKI, hypoxia, R lung nodule.  PMHx:  COPD, GERD, AICD, a-fib, CABG, sCHF, bradycardia.  Clinical Impression  Pt was seen for evaluation of his mobility and his effort was good, making progress with need for assist to walk on the hallway.  Will plan to see him acutely to progress his gait and balance, follow up with HHPT and maybe on to cardiac rehab for progression of endurance given his struggle with sats that is new to him.    Follow Up Recommendations Home health PT;Supervision for mobility/OOB    Equipment Recommendations  Cane (as PT determines)    Recommendations for Other Services Rehab consult     Precautions / Restrictions Precautions Precautions: Fall;ICD/Pacemaker Precaution Comments: telemetry off now Restrictions Weight Bearing Restrictions: No      Mobility  Bed Mobility Overal bed mobility: Needs Assistance Bed Mobility: Supine to Sit;Sit to Supine     Supine to sit: Min guard Sit to supine: Min guard      Transfers Overall transfer level: Needs assistance Equipment used: 1 person hand held assist             General transfer comment: cued hand placemetn and steadied slightly with standing  Ambulation/Gait Ambulation/Gait assistance: Min guard Ambulation Distance (Feet): 150 Feet Assistive device: 1 person hand held assist Gait Pattern/deviations: Step-through pattern;Narrow base of support;Drifts right/left;Decreased stride length Gait velocity: reduced Gait velocity interpretation: Below normal speed for age/gender General Gait Details: pt is somewhat in need of support but may only need a cane later  Stairs             Wheelchair Mobility    Modified Rankin (Stroke Patients Only)       Balance Overall balance assessment: Needs assistance Sitting-balance support: Feet supported Sitting balance-Leahy Scale: Good     Standing balance support: Single extremity supported Standing balance-Leahy Scale: Fair                               Pertinent Vitals/Pain Pain Assessment: No/denies pain    Home Living Family/patient expects to be discharged to:: Private residence Living Arrangements: Spouse/significant other Available Help at Discharge: Family;Available 24 hours/day Type of Home: House Home Access: Level entry     Home Layout: Two level Home Equipment: None      Prior Function Level of Independence: Independent         Comments: no previous use of O2     Hand Dominance        Extremity/Trunk Assessment   Upper Extremity Assessment: Overall WFL for tasks assessed           Lower Extremity Assessment: Generalized weakness      Cervical / Trunk Assessment: Normal  Communication   Communication: No difficulties  Cognition Arousal/Alertness: Awake/alert Behavior During Therapy: WFL for tasks assessed/performed Overall Cognitive Status: Within Functional Limits for tasks assessed                      General Comments      Exercises     Assessment/Plan    PT Assessment Patient needs  continued PT services  PT Problem List Decreased strength;Decreased range of motion;Decreased balance;Decreased activity tolerance;Decreased mobility;Decreased coordination;Decreased knowledge of use of DME;Decreased safety awareness;Cardiopulmonary status limiting activity          PT Treatment Interventions DME instruction;Gait training;Stair training;Functional mobility training;Therapeutic activities;Therapeutic exercise;Balance training;Neuromuscular re-education;Patient/family education    PT Goals (Current goals can be found in the Care Plan  section)  Acute Rehab PT Goals Patient Stated Goal: to get home from hospital PT Goal Formulation: With patient Time For Goal Achievement: 12/04/15 Potential to Achieve Goals: Good    Frequency Min 3X/week   Barriers to discharge Inaccessible home environment two floors    Co-evaluation               End of Session Equipment Utilized During Treatment: Oxygen Activity Tolerance: Treatment limited secondary to medical complications (Comment) (O2 sats with rest 88% and with activity 75% without O2) Patient left: in bed;with call bell/phone within reach;with bed alarm set Nurse Communication: Mobility status         Time: 9688-6484 PT Time Calculation (min) (ACUTE ONLY): 18 min   Charges:   PT Evaluation $PT Eval Low Complexity: 1 Procedure     PT G CodesIvar [name] 12-04-2015, 11:11 AM    [name], PT MS Acute Rehab Dept. Number: [facility] [id number] and [facility] [phone]

## 2015-11-21 ENCOUNTER — Inpatient Hospital Stay (HOSPITAL_COMMUNITY): Payer: Medicare Other

## 2015-11-21 MED ORDER — AMIODARONE HCL 200 MG PO TABS: ORAL_TABLET | ORAL | 0 refills | Status: DC

## 2015-11-21 MED ORDER — LEVALBUTEROL HCL 1.25 MG/0.5ML IN NEBU: 1.2500 mg | INHALATION_SOLUTION | Freq: Four times a day (QID) | RESPIRATORY_TRACT | 12 refills | Status: DC | PRN

## 2015-11-21 MED ORDER — GUAIFENESIN-DM 100-10 MG/5ML PO SYRP: 5.0000 mL | ORAL_SOLUTION | ORAL | 0 refills | Status: DC | PRN

## 2015-11-21 MED ORDER — LOPERAMIDE HCL 2 MG PO CAPS: 2.0000 mg | ORAL_CAPSULE | ORAL | 0 refills | Status: DC | PRN

## 2015-11-21 MED ORDER — GUAIFENESIN ER 600 MG PO TB12: 600.0000 mg | ORAL_TABLET | Freq: Two times a day (BID) | ORAL | 1 refills | Status: DC

## 2015-11-21 MED ORDER — LEVOFLOXACIN 750 MG PO TABS
750.0000 mg | ORAL_TABLET | Freq: Every day | ORAL | 0 refills | Status: DC
Start: 2015-11-22 — End: 2015-12-06

## 2015-11-21 NOTE — Discharge Instructions (Addendum)
PLEASE READ CAREFULLY YOUR DISCHARGE INSTRUCTIONS    Please note that following medications have been temporarily stopped: Coreg and Cozaar until blood pressure stabilizes. Also, you were taking lasix as needed but for now hold off on taking lasix as well until you are seen by your primary doctor  You were prescribed Levaquin for 7 more days so please take regularly as recommended  Please hold amiodarone until you complete therapy with Levaquin, this is expected to be started on October 25th, 2017  Any changes in your breathing status, worsening shortness of breath, chest pain, confusion, worsening weakness, please go immediately to Emergency department   I have also provided my phone number for any questions or concerns [phone] (Dr. Izola PriceMyers)  I have also scheduled an appointment for you with your doctor on October 20th, 2017 at 12:15 pm  YOU NEED CHEST XRAY on October 20th, 2017 for follow up, please call me if you are having any problems scheduling the x-ray  You will require oxygen at home at rest and with activity  We have scheduled physical therapy for you to continue at home upon discharge  You will also need to see pulmonologist, I have scheduled the appointment for you with Dr. Dema SeverinMungal please see the instructions below, appointment was scheduled for October 24th, 2017 at 9:15 am  I wish you speedy recovery and please call me with any questions or concerns at the number provided above    Information on my medicine - ELIQUIS (apixaban)  This medication education was reviewed with me or my healthcare representative as part of my discharge preparation.  Why was Eliquis prescribed for you? Eliquis was prescribed for you to reduce the risk of a blood clot forming that can cause a stroke if you have a medical condition called atrial fibrillation (a type of irregular heartbeat).  What do You need to know about Eliquis ? Take your Eliquis TWICE DAILY - one tablet in the  morning and one tablet in the evening with or without food. If you have difficulty swallowing the tablet whole please discuss with your pharmacist how to take the medication safely.  Take Eliquis exactly as prescribed by your doctor and DO NOT stop taking Eliquis without talking to the doctor who prescribed the medication.  Stopping may increase your risk of developing a stroke.  Refill your prescription before you run out.  After discharge, you should have regular check-up appointments with your healthcare provider that is prescribing your Eliquis.  In the future your dose may need to be changed if your kidney function or weight changes by a significant amount or as you get older.  What do you do if you miss a dose? If you miss a dose, take it as soon as you remember on the same day and resume taking twice daily.  Do not take more than one dose of ELIQUIS at the same time to make up a missed dose.  Important Safety Information A possible side effect of Eliquis is bleeding. You should call your healthcare provider right away if you experience any of the following: ? Bleeding from an injury or your nose that does not stop. ? Unusual colored urine (red or dark brown) or unusual colored stools (red or black). ? Unusual bruising for unknown reasons. ? A serious fall or if you hit your head (even if there is no bleeding).  Some medicines may interact with Eliquis and might increase your risk of bleeding or clotting while on Eliquis. To help  avoid this, consult your healthcare provider or pharmacist prior to using any new prescription or non-prescription medications, including herbals, vitamins, non-steroidal anti-inflammatory drugs (NSAIDs) and supplements.  This website has more information on Eliquis (apixaban): http://www.eliquis.com/eliquis/home    Community-Acquired Pneumonia, Adult Pneumonia is an infection of the lungs. There are different types of pneumonia. One type can develop  while a person is in a hospital. A different type, called community-acquired pneumonia, develops in people who are not, or have not recently been, in the hospital or other health care facility.  CAUSES Pneumonia may be caused by bacteria, viruses, or funguses. Community-acquired pneumonia is often caused by Streptococcus pneumonia bacteria. These bacteria are often passed from one person to another by breathing in droplets from the cough or sneeze of an infected person. RISK FACTORS The condition is more likely to develop in:  People who havechronic diseases, such as chronic obstructive pulmonary disease (COPD), asthma, congestive heart failure, cystic fibrosis, diabetes, or kidney disease.  People who haveearly-stage or late-stage HIV.  People who havesickle cell disease.  People who havehad their spleen removed (splenectomy).  People who havepoor Administrator.  People who havemedical conditions that increase the risk of breathing in (aspirating) secretions their own mouth and nose.   People who havea weakened immune system (immunocompromised).  People who smoke.  People whotravel to areas where pneumonia-causing germs commonly exist.  People whoare around animal habitats or animals that have pneumonia-causing germs, including birds, bats, rabbits, cats, and farm animals. SYMPTOMS Symptoms of this condition include:  Adry cough.  A wet (productive) cough.  Fever.  Sweating.  Chest pain, especially when breathing deeply or coughing.  Rapid breathing or difficulty breathing.  Shortness of breath.  Shaking chills.  Fatigue.  Muscle aches. DIAGNOSIS Your health care provider will take a medical history and perform a physical exam. You may also have other tests, including:  Imaging studies of your chest, including X-rays.  Tests to check your blood oxygen level and other blood gases.  Other tests on blood, mucus (sputum), fluid around your lungs (pleural  fluid), and urine. If your pneumonia is severe, other tests may be done to identify the specific cause of your illness. TREATMENT The type of treatment that you receive depends on many factors, such as the cause of your pneumonia, the medicines you take, and other medical conditions that you have. For most adults, treatment and recovery from pneumonia may occur at home. In some cases, treatment must happen in a hospital. Treatment may include:  Antibiotic medicines, if the pneumonia was caused by bacteria.  Antiviral medicines, if the pneumonia was caused by a virus.  Medicines that are given by mouth or through an IV tube.  Oxygen.  Respiratory therapy. Although rare, treating severe pneumonia may include:  Mechanical ventilation. This is done if you are not breathing well on your own and you cannot maintain a safe blood oxygen level.  Thoracentesis. This procedureremoves fluid around one lung or both lungs to help you breathe better. HOME CARE INSTRUCTIONS  Take over-the-counter and prescription medicines only as told by your health care provider.  Only takecough medicine if you are losing sleep. Understand that cough medicine can prevent your body's natural ability to remove mucus from your lungs.  If you were prescribed an antibiotic medicine, take it as told by your health care provider. Do not stop taking the antibiotic even if you start to feel better.  Sleep in a semi-upright position at night. Try sleeping in a  reclining chair, or place a few pillows under your head.  Do not use tobacco products, including cigarettes, chewing tobacco, and e-cigarettes. If you need help quitting, ask your health care provider.  Drink enough water to keep your urine clear or pale yellow. This will help to thin out mucus secretions in your lungs. PREVENTION There are ways that you can decrease your risk of developing community-acquired pneumonia. Consider getting a pneumococcal vaccine  if:  You are older than 71 years of age.  You are older than 71 years of age and are undergoing cancer treatment, have chronic lung disease, or have other medical conditions that affect your immune system. Ask your health care provider if this applies to you. There are different types and schedules of pneumococcal vaccines. Ask your health care provider which vaccination option is best for you. You may also prevent community-acquired pneumonia if you take these actions:  Get an influenza vaccine every year. Ask your health care provider which type of influenza vaccine is best for you.  Go to the dentist on a regular basis.  Wash your hands often. Use hand sanitizer if soap and water are not available. SEEK MEDICAL CARE IF:  You have a fever.  You are losing sleep because you cannot control your cough with cough medicine. SEEK IMMEDIATE MEDICAL CARE IF:  You have worsening shortness of breath.  You have increased chest pain.  Your sickness becomes worse, especially if you are an older adult or have a weakened immune system.  You cough up blood.   This information is not intended to replace advice given to you by your health care provider. Make sure you discuss any questions you have with your health care provider.   Document Released: 01/21/2005 Document Revised: 10/12/2014 Document Reviewed: 05/18/2014 Elsevier Interactive Patient Education [employer]

## 2015-11-21 NOTE — Progress Notes (Signed)
SATURATION QUALIFICATIONS: (This note is used to comply with regulatory documentation for home oxygen)  Patient Saturations on Room Air at Rest = 90%  Patient Saturations on Room Air while Ambulating = 80%  Patient Saturations on 2 Liters of oxygen while Ambulating = 93%  Please briefly explain why patient needs home oxygen: pt needs o2 in order to ambulate at home

## 2015-11-21 NOTE — Discharge Summary (Signed)
Physician Discharge Summary  [name]:[id number] DOB: 1945-12-28 DOA: 11/17/2015  PCP: Crawford Givens, MD  Admit date: 11/17/2015 Discharge date: 11/21/2015  Recommendations for Outpatient Follow-up:  1. Pt will need to follow up with PCP in Oct 20th, 2017, appointment scheduled  2. Please obtain CXR for follow up to make sure LLL PNA is resolving  3. If CXR looks looks worse, or if pt clinically deteriorates he will need to return to hospital for further evaluation. Pt wants to go home today and after speaking with PCCM, this is reasonable but close follow up is essential. PT and wife both made aware of that  4. Please note that following medications have been temporarily stopped: Coreg and Cozaar until blood pressure stabilizes 5. Also pt was taking lasix as needed but for now pt advised to hold off on taking lasix as well until clinically indicated  6. Complete Levaquin for 7 more days post discharge  7. Please note that pt was told to hold amiodarone until he completes therapy with Levaquin, this is expected to be started on October 25th, 2017 8. Pt advised to go to ED if any changes in your breathing status, worsening shortness of breath, chest pain, confusion, worsening weakness 9. Pt requires home oxygen and he has been made aware  10. Also arranged physical therapy to continue at home upon discharge 11. Pt also to see pulmonologist, I have scheduled the appointment for pt with Dr. Dema Severin please see the instructions below, appointment was scheduled for October 24th, 2017 at 9:15 am  Discharge Diagnoses:  Active Problems:   HCAP (healthcare-associated pneumonia)  Discharge Condition: Stable  Diet recommendation: Heart healthy diet discussed in details   Brief Narrative:  71 y.o.malewith medical history significant of COPD, GERD, anxiety, bradycardia, s/p of AICD, sCHF (no 2D echo on record), A fib on Eliquis, CAD, s/p of CABG, recently discharged on Oct 4th, 2017  after being treated for LLL CAP and who nowpresents from PCP office where he was seen earlier today and found to be hypoxic. Pt was referred for an admission but is very clear he has not experienced any dyspnea, chest pain, fevers, chills and actually says he has felt bit better since his recent discharged. He does mention he was discharged on doxycycline on last discharge but did not tolerate medicine well and was throwing up and having some diarrhea, this has resolved since he has completed ABX.   In ED, pt noted to be hypoxic on RA in 80's and requiring [place] 2-3 L which was improved the oxygen sats to low 90's. VS notable for mild bradycardia with HR in 50's otherwise stable. Blood work unremarkable.   Hospital Course:  Acute respiratory failure with hypoxia (HCC) due to multifocal HCAP - CXR on this admission with persistent left lower and developing left upper lobe airspace disease, consistent with worsening PNA or asymmetric pulmonary edema - CT chest conforms multi focal infiltrate involving the lower lobes, L > R, and LUL - also noted 4 mm nodule in the right upper lobe. Recommend attention on short-term follow-up as pt has history of smoking  - repeat CXR with worsening PNA on the left side but pt really wants to go home, he is hemodynamically stable enough to go home but needs close follow up, recommend CXR by this Friday as noted above and recommend follow up with pulmonologist, appointment scheduled  - provide antitussives and BD if needed  - must complete therapy with Levaquin for 7  more days   AICD for V tach - was admitted last month due to AICD firing - no cause found- holding Amio until pt completes therapy with Levaquin (to avoid potential side effect of prolonged QT) - no chest pain   Coronary atherosclerosis - s/p of CABG, no CP - Continue aspirin, Ranexa and prn NTG  Bradycardia - continue to hold BB until HR stabilizes   COPD (chronic obstructive pulmonary disease)  (HCC) - with no acute exacerbation   Chronic systolic CHF (congestive heart failure) (HCC) - No 2-D echo on record. No leg edema or JVD. CHF is compensated - hold Coreg, cozaar as SBP is on low end of normal in 90's  Acute kidney injury - from poor oral intake and recent diarrhea and vomiting which have since resolved - Cr trending down from 1.5 --> 1.25 --> 1.00 --> 0.85  Transaminitis - unclear cause, ? amio - trending down   GERD - continue home regimen with Pepcid   Atrial Fibrillation - CHA2DS2-VASc Scoreis 3, needs oral anticoagulation.  - Patient is on Eliquis at home. Heart rate is well controlled. - continue Eliquis   Anxiety - Continue home medications: Valium  DVT prophylaxis: On Eliquis  Code Status: Full  Family Communication: Patient at bedside, wife over the phone  Disposition Plan: Home   Consultants:   None  Procedures:   None  Antimicrobials:   Vancomycin 10/13 --> 10/16  Aztreonam 10/13 --> 10/16  Levaquin 10/16 -->   Procedures/Studies: Dg Chest 2 View  Result Date: 11/21/2015 CLINICAL DATA:  Dyspnea EXAM: CHEST  2 VIEW COMPARISON:  11/17/2015 FINDINGS: Radiographically progressed diffuse left lung opacity. Emphysematous spaces seen on prior chest CT. No cavitation or effusion. Chronic cardiopericardial enlargement. Single chamber ICD/ pacer from the left. No effusion or pneumothorax. IMPRESSION: 1. Progressed left lung pneumonia or pneumonitis. 2. Emphysema. 3. Cardiomegaly. Electronically Signed   By: Marnee Spring M.D.   On: 11/21/2015 11:30   Dg Chest 2 View  Result Date: 11/17/2015 CLINICAL DATA:  Chest pain.  Recent pneumonia. EXAM: CHEST  2 VIEW COMPARISON:  11/06/2015 FINDINGS: Pacer/AICD device. Midline trachea. Mild cardiomegaly. No pleural effusion or pneumothorax. Diffuse interstitial thickening. Persistent left lower lobe and developing left upper lobe airspace disease. IMPRESSION: Persistent left lower and  developing left upper lobe airspace disease. This is superimposed upon cardiomegaly and diffuse interstitial prominence. Favor progressive pneumonia. Asymmetric pulmonary edema could look similar. Electronically Signed   By: Jeronimo Greaves M.D.   On: 11/17/2015 13:25   Dg Chest 2 View  Result Date: 11/06/2015 CLINICAL DATA:  Hypoxia, vomiting and diarrhea last night EXAM: CHEST  2 VIEW COMPARISON:  09/27/2015 and dating back through 11/12/2012 FINDINGS: The heart is enlarged. AICD device projects over the left hemithorax with single lead projecting in the expected location of the right ventricle. Left lower lobe airspace disease suspicious for pneumonia is noted with small loculated posterior pleural effusion. No acute osseous abnormality. Cholecystectomy clips present in the right upper quadrant. IMPRESSION: New left lower lobe infiltrate with small posterior loculated pleural effusion. Electronically Signed   By: Tollie Eth M.D.   On: 11/06/2015 20:00   Ct Chest Wo Contrast  Result Date: 11/17/2015 CLINICAL DATA:  Hypoxia.  Former smoker. EXAM: CT CHEST WITHOUT CONTRAST TECHNIQUE: Multidetector CT imaging of the chest was performed following the standard protocol without IV contrast. COMPARISON:  CT chest August 02, 2008 and recent chest x-rays. FINDINGS: Cardiovascular: A pacemaker device is seen. There are coronary  artery calcifications on the left. Cardiomegaly is noted. No effusions. Mediastinum/Nodes: Small nodules in the thyroid of doubtful significance. The thoracic aorta is normal in caliber. It is also mildly tortuous with minimal atherosclerotic change. The central pulmonary arteries are normal. The visualized esophagus is unremarkable. A calcified node is seen in the right hilum on image 31 of series 2. A few calcified right hilar nodes are seen as well. A single mildly prominent right paratracheal node measures 12 mm likely reactive. A few other shotty nodes are identified. Lungs/Pleura: Mild  opacity is seen in the right lung base. Opacity is also seen in the left upper lobe, primarily medially. More focal opacity is seen in the left lung base with a rounded region of opacity seen on axial image 90, series 5. Mild emphysematous changes. Tiny irregular nodule in the right upper lobe on series 5, image 53 measuring 4 mm. Evaluation for nodules is otherwise limited due the pulmonary opacities but no other nodules or masses are noted. Upper Abdomen: Previous cholecystectomy. No other acute abnormalities in the upper abdomen. Musculoskeletal: Degenerative changes in the spine. No other acute bony abnormalities. IMPRESSION: 1. Multi focal infiltrate involving the lower lobes, left greater than right, and the left upper lobe is most likely an infectious or inflammatory process. Recommend short-term follow-up to ensure resolution. 2. 4 mm nodule in the right upper lobe. Recommend attention on short-term follow-up. Electronically Signed   By: Gerome Sam III M.D   On: 11/17/2015 19:28    Discharge Exam: Vitals:   11/21/15 0441 11/21/15 1226  BP: 114/61 (!) 96/52  Pulse: 65 61  Resp: 18 18  Temp: 98.5 F (36.9 C) 98.2 F (36.8 C)   Vitals:   11/20/15 1152 11/20/15 2011 11/21/15 0441 11/21/15 1226  BP: (!) 101/56 109/60 114/61 (!) 96/52  Pulse: (!) 57 72 65 61  Resp: 18 18 18 18   Temp: 97.4 F (36.3 C) 98.2 F (36.8 C) 98.5 F (36.9 C) 98.2 F (36.8 C)  TempSrc: Oral Oral Oral Oral  SpO2: 94% 93% 90% 93%  Weight:   64.8 kg (142 lb 14.4 oz)   Height:        General: Pt is alert, follows commands appropriately, not in acute distress Cardiovascular: Regular rate and rhythm, S1/S2 +, no rubs, no gallops Respiratory: rhonchi at bases L > R side, no wheezing  Abdominal: Soft, non tender, non distended, bowel sounds +, no guarding   Discharge Instructions  Discharge Instructions    AMB Referral to [facility] Care Management    Complete by:  As directed    Reason for consult:  Post  hospital follow up, admissions   Diagnoses of:  COPD/ Pneumonia   Expected date of contact:  1-3 days (reserved for hospital discharges)   Please assign to community nurse for transition of care calls and assess for home visits. PLEASE CALL AFTER 12 NOON!  Questions please call:   Charlesetta Shanks, RN BSN CCM [name]  [phone] business mobile phone Toll free office [phone]   Diet - low sodium heart healthy    Complete by:  As directed    Increase activity slowly    Complete by:  As directed        Medication List    STOP taking these medications   carvedilol 3.125 MG tablet Commonly known as:  COREG   furosemide 20 MG tablet Commonly known as:  LASIX   losartan 25 MG tablet Commonly known as:  COZAAR     TAKE these medications   amiodarone 200 MG tablet Commonly known as:  PACERONE Take 200 mg by mouth Mon-Fri and 400 mg on Sat and Sun. Please start taking once completed therapy with Levaquin on October 25th, 2017. Start taking on:  11/29/2015 What changed:  additional instructions   anti-nausea solution Take 10 mLs by mouth every 15 (fifteen) minutes as needed for nausea or vomiting.   apixaban 5 MG Tabs tablet Commonly known as:  ELIQUIS Take 1 tablet (5 mg total) by mouth 2 (two) times daily. Resume 09/29/15   aspirin 81 MG EC tablet Take 1 tablet (81 mg total) by mouth daily. What changed:  when to take this   benzonatate 200 MG capsule Commonly known as:  TESSALON TAKE 1 CAPSULE BY MOUTH 3 TIMES DAILY AS NEEDED FOR COUGH What changed:  See the new instructions.   diazepam 5 MG tablet Commonly known as:  VALIUM TAKE 1 TABLET BY MOUTH EVERY 6 HOURS AS NEEDED What changed:  See the new instructions.   donepezil 10 MG tablet Commonly known as:  ARICEPT Take 1 tablet (10 mg total) by mouth at bedtime.   fexofenadine 180 MG tablet Commonly known as:  ALLEGRA Take 180 mg by mouth daily as needed for allergies or rhinitis.    Gerhardt's butt cream Crea Apply 1 application topically every 6 (six) hours as needed for irritation.   guaiFENesin 600 MG 12 hr tablet Commonly known as:  MUCINEX Take 1 tablet (600 mg total) by mouth 2 (two) times daily.   guaiFENesin-dextromethorphan 100-10 MG/5ML syrup Commonly known as:  ROBITUSSIN DM Take 5 mLs by mouth every 4 (four) hours as needed for cough.   HYDROcodone-acetaminophen 5-325 MG tablet Commonly known as:  NORCO/VICODIN TAKE 1 TABLET BY MOUTH EVERY 6 HOURS AS NEEDED FOR PAIN What changed:  how much to take  how to take this  when to take this  reasons to take this  additional instructions   levalbuterol 1.25 MG/0.5ML nebulizer solution Commonly known as:  XOPENEX Take 1.25 mg by nebulization every 6 (six) hours as needed for wheezing or shortness of breath.   levofloxacin 750 MG tablet Commonly known as:  LEVAQUIN Take 1 tablet (750 mg total) by mouth daily. Start taking on:  11/22/2015   loperamide 2 MG capsule Commonly known as:  IMODIUM Take 1 capsule (2 mg total) by mouth as needed for diarrhea or loose stools.   metaxalone 800 MG tablet Commonly known as:  SKELAXIN TAKE 1 TABLET BY MOUTH 3 TIMES A DAY AS NEEDED FOR PAIN. What changed:  how much to take  how to take this  when to take this  reasons to take this  additional instructions   MULTIVITAMIN ADULTS 50+ Tabs Take 1 tablet by mouth daily.   nitroGLYCERIN 0.4 MG SL tablet Commonly known as:  NITROSTAT PLACE 1 TABLET (0.4 MG TOTAL) UNDER THE TONGUE EVERY 5 (FIVE) MINUTES AS NEEDED. FOR CHEST PAIN. What changed:  See the new instructions.   oxymetazoline 0.05 % nasal spray Commonly known as:  AFRIN Place 1 spray into both nostrils 2 (two) times daily as needed for congestion.   promethazine 25 MG tablet Commonly known as:  PHENERGAN Take 0.5-1 tablets (12.5-25 mg total) by mouth every 8 (eight) hours as needed for nausea or vomiting.   ranitidine 150 MG  capsule Commonly known as:  ZANTAC Take 150 mg by mouth daily as needed for heartburn.   ranolazine 500  MG 12 hr tablet Commonly known as:  RANEXA Take 1 tablet (500 mg total) by mouth 2 (two) times daily.   traMADol 50 MG tablet Commonly known as:  ULTRAM TAKE 1 TO 2 TABLETS BY MOUTH EVERY 8 HOURS AS NEEDED What changed:  See the new instructions.   zolpidem 12.5 MG CR tablet Commonly known as:  AMBIEN CR TAKE 1 TABLET BY MOUTH AT BEDTIME What changed:  See the new instructions.       Follow-up Information    Crawford Givens, MD Follow up on 11/24/2015.   Specialty:  Family Medicine Why:  Appointment scheduled at 12:15 pm on October 20th, 2017 Contact information: [address] [place] [address] [phone]        Stephanie Acre, MD Follow up on 11/28/2015.   Specialty:  Internal Medicine Why:  at 9:30 am Contact information: [address] [place] [address] [phone]            The results of significant diagnostics from this hospitalization (including imaging, microbiology, ancillary and laboratory) are listed below for reference.     Microbiology: Recent Results (from the past 240 hour(s))  Culture, blood (routine x 2)     Status: None (Preliminary result)   Collection Time: 11/17/15  2:10 PM  Result Value Ref Range Status   Specimen Description BLOOD RIGHT ANTECUBITAL  Final   Special Requests BOTTLES DRAWN AEROBIC AND ANAEROBIC 5CC  Final   Culture NO GROWTH 4 DAYS  Final   Report Status PENDING  Incomplete  Culture, blood (routine x 2)     Status: None (Preliminary result)   Collection Time: 11/17/15  3:00 PM  Result Value Ref Range Status   Specimen Description BLOOD LEFT ANTECUBITAL  Final   Special Requests BOTTLES DRAWN AEROBIC AND ANAEROBIC 5CC  Final   Culture NO GROWTH 4 DAYS  Final   Report Status PENDING  Incomplete     Labs: Basic Metabolic Panel:  Recent Labs Lab 11/17/15 1257 11/18/15 0550  11/19/15 0458 11/20/15 0335  NA 139 138 140 138  K 4.3 4.0 4.1 4.2  CL 105 105 107 105  CO2 26 26 27 27   GLUCOSE 116* 111* 117* 103*  BUN 25* 22* 17 15  CREATININE 1.52* 1.25* 1.00 0.85  CALCIUM 8.7* 8.0* 7.9* 7.9*   Liver Function Tests:  Recent Labs Lab 11/17/15 1312 11/19/15 0458  AST 145* 120*  ALT 116* 113*  ALKPHOS 68 55  BILITOT 1.2 0.8  PROT 6.5 5.3*  ALBUMIN 3.0* 2.2*   CBC:  Recent Labs Lab 11/17/15 1257 11/19/15 0458 11/20/15 0335  WBC 9.6 7.3 9.3  HGB 12.4* 11.3* 11.0*  HCT 39.2 36.5* 35.0*  MCV 89.9 91.0 89.5  PLT 341 289 290   BNP (last 3 results)  Recent Labs  05/25/15 1612 09/27/15 0658 11/07/15 0253  BNP 252.2* 184.8* 184.2*   SIGNED: Time coordinating discharge: 30 minutes  Debbora Presto, MD  [facility] 11/21/2015, 4:27 PM Pager [phone]  If 7PM-7AM, please contact night-coverage [email or web address] Password [email or web address]

## 2015-11-22 ENCOUNTER — Other Ambulatory Visit: Payer: Self-pay | Admitting: *Deleted

## 2015-11-22 ENCOUNTER — Telehealth: Payer: Self-pay | Admitting: Internal Medicine

## 2015-11-22 ENCOUNTER — Encounter: Payer: Self-pay | Admitting: *Deleted

## 2015-11-22 LAB — CULTURE, BLOOD (ROUTINE X 2)
CULTURE: NO GROWTH
CULTURE: NO GROWTH

## 2015-11-22 NOTE — Telephone Encounter (Signed)
Called, spoke with pt's wife, Hilda Lias. Wife concerned about d/c orders from hospital on 11/21/15. She would like Dr. [name] to review recommendation r/t cardiac meds.   Dr. [name] recommended pt to start Amiodarone back when Levaqin is complete (which should be on 11/29/15). Pt has appt with PCP on 11/24/15. Pt should f/u with PCP about starting back Coreg and Cozaar. D/c summary states meds were temporarily stopped due to unstable BP. Informed to call back with questions or concerns. Wife verbalized understanding and agreed with plan. She thanked me for calling.

## 2015-11-22 NOTE — Telephone Encounter (Signed)
Pt's wife calling, pt was in hospital with double pneumonia, was told to stop all heart meds due To abx he is now on--pls advise  [phone]

## 2015-11-22 NOTE — Patient Outreach (Signed)
[facility] Customer service manager [facility]) Care Management [facility] Community CM Telephone Outreach, Transition of Care Attempt #1 11/22/2015  MEARL KELLOM [birth year] [phone]  Unsuccessful telephone outreach to [name], 70 y/o male, referred to [facility] Community CM for transition of care after recent hospitalization October 13-17, 2017 for HCAP, hypoxia, and AKI secondary to dehydration.  Patient was treated with antibiotics, IV fluids, and supportive care.  Most recent admission is second hospitalization after October 2-4, 2017 visit, also for HCAP and Acute Respiratory Failure.  Patient noted to have history including but not limited to, COPD, sCHF, CAD with CABG, V-fib with AICD placement, A-Fib, bradycardia, and GERD.  HIPAA compliant voice mail message left for patient, requesting return call back.  Plan:  Will re-attempt [facility] Community CM telephone outreach for transition of care later this week if I do not hear back from patient first.  Caryl Pina, RN, BSN, CCRN [employer]umn[employer] [facility] Care Management  [phone]

## 2015-11-23 ENCOUNTER — Other Ambulatory Visit: Payer: Self-pay | Admitting: *Deleted

## 2015-11-23 ENCOUNTER — Telehealth: Payer: Self-pay

## 2015-11-23 ENCOUNTER — Telehealth: Payer: Self-pay | Admitting: Internal Medicine

## 2015-11-23 LAB — CUP PACEART REMOTE DEVICE CHECK
Battery Voltage: 3 V
Brady Statistic RV Percent Paced: 0.88 %
Date Time Interrogation Session: 20170925141649
HIGH POWER IMPEDANCE MEASURED VALUE: 45 Ohm
HIGH POWER IMPEDANCE MEASURED VALUE: 53 Ohm
Lead Channel Impedance Value: 323 Ohm
Lead Channel Impedance Value: 380 Ohm
Lead Channel Pacing Threshold Amplitude: 0.875 V
Lead Channel Sensing Intrinsic Amplitude: 3.625 mV
MDC IDC LEAD IMPLANT DT: 20040722
MDC IDC LEAD LOCATION: 753860
MDC IDC MSMT BATTERY REMAINING LONGEVITY: 96 mo
MDC IDC MSMT LEADCHNL RV PACING THRESHOLD PULSEWIDTH: 0.4 ms
MDC IDC MSMT LEADCHNL RV SENSING INTR AMPL: 3.625 mV
MDC IDC SET LEADCHNL RV PACING AMPLITUDE: 2.5 V
MDC IDC SET LEADCHNL RV PACING PULSEWIDTH: 0.4 ms
MDC IDC SET LEADCHNL RV SENSING SENSITIVITY: 0.3 mV

## 2015-11-23 NOTE — Telephone Encounter (Signed)
Per staff msg lmov to r/s patient hfu with Dr. Dema Severin.  Appt should be  2 wks out if patient is ok with this.

## 2015-11-23 NOTE — Patient Outreach (Signed)
[facility] Customer service manager [facility]) Care Management [facility] Community CM Telephone Outreach, Transition of Care, Attempt #2  11/23/2015  [name] 11/11/1945 [id number]  Second attempt/ unsuccessful telephone outreach to [name], 70 y/o male, referred to [facility] Community CM for transition of care after recent hospitalization October 13-17, 2017 for HCAP, hypoxia, and AKI secondary to dehydration.  Patient was treated with antibiotics, IV fluids, and supportive care.  Most recent admission is second hospitalization after October 2-4, 2017 visit, also for HCAP and Acute Respiratory Failure.  Patient noted to have history including but not limited to, COPD, sCHF, CAD with CABG, V-fib with AICD placement, A-Fib, bradycardia, and GERD.  HIPAA compliant voice mail message left for patient, requesting return call back.  Plan:  Will re-attempt [facility] Community CM telephone outreach for transition of care again tomorrow if I do not hear back from patient first.  Caryl Pina, RN, BSN, CCRN [employer] Coordinator [facility] Care Management  [phone]

## 2015-11-23 NOTE — Telephone Encounter (Signed)
Attempted to reach patient for TCM outreach. Both attempts unsuccessful. Left VM on both home and mobile phones.

## 2015-11-24 ENCOUNTER — Ambulatory Visit (INDEPENDENT_AMBULATORY_CARE_PROVIDER_SITE_OTHER): Payer: Medicare Other | Admitting: Family Medicine

## 2015-11-24 ENCOUNTER — Ambulatory Visit: Payer: Medicare Other

## 2015-11-24 ENCOUNTER — Ambulatory Visit (INDEPENDENT_AMBULATORY_CARE_PROVIDER_SITE_OTHER)
Admission: RE | Admit: 2015-11-24 | Discharge: 2015-11-24 | Disposition: A | Discharge status: Home / Self Care | Payer: Medicare Other | Source: Ambulatory Visit | Attending: Family Medicine | Admitting: Family Medicine

## 2015-11-24 ENCOUNTER — Telehealth: Payer: Self-pay | Admitting: *Deleted

## 2015-11-24 ENCOUNTER — Other Ambulatory Visit: Payer: Self-pay | Admitting: *Deleted

## 2015-11-24 ENCOUNTER — Encounter: Payer: Self-pay | Admitting: Family Medicine

## 2015-11-24 ENCOUNTER — Encounter: Payer: Self-pay | Admitting: *Deleted

## 2015-11-24 ENCOUNTER — Other Ambulatory Visit: Payer: Medicare Other

## 2015-11-24 VITALS — BP 102/52 | HR 81 | Temp 98.0°F | Wt 140.8 lb

## 2015-11-24 DIAGNOSIS — J189 Pneumonia, unspecified organism: Secondary | ICD-10-CM

## 2015-11-24 DIAGNOSIS — R0602 Shortness of breath: Secondary | ICD-10-CM | POA: Diagnosis not present

## 2015-11-24 NOTE — Patient Outreach (Signed)
[facility]) Care Management [facility] Community CM telephone Outreach, Transition of Care day 1 11/24/2015  Leda MinRichard L Kerekes 08-28-1945 [id number]  Successful telephone outreach (TOC attempt #3) to Jacqualin Combesichard Hensch, 70 y/o male, [facility] for [facility] of care after recent hospitalization October 13-17, 2017 for HCAP, hypoxia, and AKI secondary to dehydration. Patient was treated with antibiotics, IV fluids, and supportive care. Most recent admission is second hospitalization after October 2-4, 2017 visit, also for HCAP and Acute Respiratory Failure. Patient noted to have history including but not limited to, COPD, sCHF, CAD with CABG, V-fib with AICD placement, A-Fib, bradycardia, and GERD.  HIPAA/ identity verified with patient and his wife today while phone on speaker mode, and verbal consent to [facility] involvement in patient's care was provided.  Today, [name] reports that he is "improving slowly but surely."  -- Attended follow up PCP appointment today, where patient "got a good report."  Home O2 was increased during PCP visit from 2 L/min to 3 L/min via [place]; patient/ wife report no other changes made during office visit today.  -- [facility]) PT:  [facility], but that they have not yet heard back from [facility] for services to start.  I inquired if they mentioned this to Dr. Para Marchuncan during office earlier today, and they report that "it was not brought up during the visit."  I inquired if patient/ wife felt that patient needed only PT, or if they felt a nurse would also be beneficial.  Patient/ wife report that they believe [facility] were not necessary.  Patient's wife stated that "the hospital discharge was botched up," stating that patient was not discharged home until after 7:00 pm, and that [facility] "didn't arrive until after 12:30 am that night."   Wife stated that [facility].  I offered to facilitate follow up on HH orders, and explained that given the time of day today, that I may not have definitive answers back by end of day today, to which patient/ wife stated, "it's no rush on our end, it's not urgent."    -- Medications:  Patient and his wife report that patient has all medications and is taking as prescribed; patient and his wife state no questions about patient's current medications, and state that they are unable to complete medication reconciliation today, due patient stated time constraints.    -- Mobility:  Patient reports using wheelchair to get around "for now."  [facility] services were discussed with patient and his wife, including how HH services differs [facility].  Patient and his wife state today that they do not wish to have a [facility] in- home visit "at this point," stating "we have everything under control and he is doing fine, we don't think that is necessary."  We discussed that I would re-contact patient on Monday, and patient/ wife requested that I contact Mrs. Christena DeemBosley (on [facility] written consent) on her cell number [phone]) on Monday 11/27/15 around 1:00 pm, as patient has requested no phone communication before 12:00 noon, and patient stated that 12:15 pm "was too early" for a phone call.  I provided patient/ wife with my direct phone number, the main office number for [facility], and the 24-hour nurse advice line.  Patient and his wife deny further questions, concerns,  problems or issues today.  Plan:  Mr. Bethell will take his medications as they are prescribed and will attend all scheduled provider appointments.  Mr. Yoshikawa will promptly notify his providers for any concerns, issues, or problems that arise.  I will contact [facility] and PCP for care coordination around [facility] PT orders placed at the time of hospital discharge.  [facility] Community CM outreach for  transition of care to continue with scheduled phone call next week.   Caryl Pina, RN, BSN, [employer] [facility] Care Management  [phone]

## 2015-11-24 NOTE — Telephone Encounter (Signed)
Printed order and faxed to [facility].

## 2015-11-24 NOTE — Progress Notes (Signed)
Admit date: 11/17/2015 Discharge date: 11/21/2015  Recommendations for Outpatient Follow-up:  1. Pt will need to follow up with PCP in Oct 20th, 2017, appointment scheduled  2. Please obtain CXR for follow up to make sure LLL PNA is resolving  3. If CXR looks looks worse, or if pt clinically deteriorates he will need to return to hospital for further evaluation. Pt wants to go home today and after speaking with PCCM, this is reasonable but close follow up is essential. PT and wife both made aware of that  4. Please note that following medications have been temporarily stopped: Coreg and Cozaar until blood pressure stabilizes 5. Also pt was taking lasix as needed but for now pt advised to hold off on taking lasix as well until clinically indicated  6. Complete Levaquin for 7 more days post discharge  7. Please note that pt was told to hold amiodarone until he completes therapy with Levaquin, this is expected to be started on October 25th, 2017 8. Pt advised to go to ED if any changes in your breathing status, worsening shortness of breath, chest pain, confusion, worsening weakness 9. Pt requires home oxygen and he has been made aware  10. Also arranged physical therapy to continue at home upon discharge 11. Pt also to see pulmonologist, I have scheduled the appointment for pt with Dr. Dema Severin please see the instructions below, appointment was scheduled for October 24th, 2017 at 9:15 am  Discharge Diagnoses:  Active Problems:   HCAP (healthcare-associated pneumonia)  Discharge Condition: Stable  Diet recommendation: Heart healthy diet discussed in details   Brief Narrative: 70 y.o.malewith medical history significant of COPD, GERD, anxiety, bradycardia, s/p of AICD, sCHF (no 2D echo on record), A fib on Eliquis, CAD, s/p of CABG, recently discharged on Oct 4th, 2017 after being treated for LLL CAP and who nowpresents from PCP office where he was seen earlier today and found to be  hypoxic. Pt was referred for an admission but is very clear he has not experienced any dyspnea, chest pain, fevers, chills and actually says he has felt bit better since his recent discharged. He does mention he was discharged on doxycycline on last discharge but did not tolerate medicine well and was throwing up and having some diarrhea, this has resolved since he has completed ABX.   In ED, pt noted to be hypoxic on RA in 80's and requiring [place] 2-3 L which was improved the oxygen sats to low 90's. VS notable for mild bradycardia with HR in 50's otherwise stable. Blood work unremarkable.   Hospital Course:  Acute respiratory failure with hypoxia (HCC) due to multifocal HCAP - CXR on this admission with persistent left lower and developing left upper lobe airspace disease, consistent with worsening PNA or asymmetric pulmonary edema - CT chest conforms multi focal infiltrate involving the lower lobes, L > R, and LUL - also noted 4 mm nodule in the right upper lobe. Recommend attention on short-term follow-up as pt has history of smoking  - repeat CXR with worsening PNA on the left side but pt really wants to go home, he is hemodynamically stable enough to go home but needs close follow up, recommend CXR by this Friday as noted above and recommend follow up with pulmonologist, appointment scheduled  - provide antitussives and BD if needed  - must complete therapy with Levaquin for 7 more days   AICD for V tach - was admitted last month due to AICD firing - no cause  found- holding Amio until pt completes therapy with Levaquin (to avoid potential side effect of prolonged QT) - no chest pain   Coronary atherosclerosis - s/p of CABG, no CP - Continue aspirin, Ranexa and prn NTG  Bradycardia - continue to hold BB until HR stabilizes   COPD (chronic obstructive pulmonary disease) (HCC) - with no acute exacerbation   Chronic systolic CHF (congestive heart failure) (HCC) - No 2-D echo on  record. No leg edema or JVD. CHF is compensated - hold Coreg, cozaar as SBP is on low end of normal in 90's  Acute kidney injury - from poor oral intake and recent diarrhea and vomiting which have since resolved - Cr trending down from 1.5 -->1.25 -->1.00 --> 0.85  Transaminitis - unclear cause, ? amio - trending down   GERD - continue home regimen with Pepcid   Atrial Fibrillation - CHA2DS2-VASc Scoreis 3, needs oral anticoagulation.  - Patient is on Eliquis at home. Heart rate is well controlled. - continue Eliquis   Anxiety - Continue home medications: Valium  DVT prophylaxis:On Eliquis  Code Status:Full  Family Communication:Patient at bedside, wife over the phone  Disposition Plan:Home   Consultants:  None  Procedures:  None  Antimicrobials:   Vancomycin 10/13 -->10/16  Aztreonam 10/13 -->10/16  Levaquin 10/16 -->   Procedures/Studies: ImagingResults   Dg Chest 2 View  Result Date: 11/21/2015 CLINICAL DATA:  Dyspnea EXAM: CHEST  2 VIEW COMPARISON:  11/17/2015 FINDINGS: Radiographically progressed diffuse left lung opacity. Emphysematous spaces seen on prior chest CT. No cavitation or effusion. Chronic cardiopericardial enlargement. Single chamber ICD/ pacer from the left. No effusion or pneumothorax. IMPRESSION: 1. Progressed left lung pneumonia or pneumonitis. 2. Emphysema. 3. Cardiomegaly. Electronically Signed   By: Marnee Spring M.D.   On: 11/21/2015 11:30   Dg Chest 2 View  Result Date: 11/17/2015 CLINICAL DATA:  Chest pain.  Recent pneumonia. EXAM: CHEST  2 VIEW COMPARISON:  11/06/2015 FINDINGS: Pacer/AICD device. Midline trachea. Mild cardiomegaly. No pleural effusion or pneumothorax. Diffuse interstitial thickening. Persistent left lower lobe and developing left upper lobe airspace disease. IMPRESSION: Persistent left lower and developing left upper lobe airspace disease. This is superimposed upon cardiomegaly and  diffuse interstitial prominence. Favor progressive pneumonia. Asymmetric pulmonary edema could look similar. Electronically Signed   By: Jeronimo Greaves M.D.   On: 11/17/2015 13:25   Dg Chest 2 View  Result Date: 11/06/2015 CLINICAL DATA:  Hypoxia, vomiting and diarrhea last night EXAM: CHEST  2 VIEW COMPARISON:  09/27/2015 and dating back through 11/12/2012 FINDINGS: The heart is enlarged. AICD device projects over the left hemithorax with single lead projecting in the expected location of the right ventricle. Left lower lobe airspace disease suspicious for pneumonia is noted with small loculated posterior pleural effusion. No acute osseous abnormality. Cholecystectomy clips present in the right upper quadrant. IMPRESSION: New left lower lobe infiltrate with small posterior loculated pleural effusion. Electronically Signed   By: Tollie Eth M.D.   On: 11/06/2015 20:00   Ct Chest Wo Contrast  Result Date: 11/17/2015 CLINICAL DATA:  Hypoxia.  Former smoker. EXAM: CT CHEST WITHOUT CONTRAST TECHNIQUE: Multidetector CT imaging of the chest was performed following the standard protocol without IV contrast. COMPARISON:  CT chest August 02, 2008 and recent chest x-rays. FINDINGS: Cardiovascular: A pacemaker device is seen. There are coronary artery calcifications on the left. Cardiomegaly is noted. No effusions. Mediastinum/Nodes: Small nodules in the thyroid of doubtful significance. The thoracic aorta is normal in caliber. It  is also mildly tortuous with minimal atherosclerotic change. The central pulmonary arteries are normal. The visualized esophagus is unremarkable. A calcified node is seen in the right hilum on image 31 of series 2. A few calcified right hilar nodes are seen as well. A single mildly prominent right paratracheal node measures 12 mm likely reactive. A few other shotty nodes are identified. Lungs/Pleura: Mild opacity is seen in the right lung base. Opacity is also seen in the left upper lobe,  primarily medially. More focal opacity is seen in the left lung base with a rounded region of opacity seen on axial image 90, series 5. Mild emphysematous changes. Tiny irregular nodule in the right upper lobe on series 5, image 53 measuring 4 mm. Evaluation for nodules is otherwise limited due the pulmonary opacities but no other nodules or masses are noted. Upper Abdomen: Previous cholecystectomy. No other acute abnormalities in the upper abdomen. Musculoskeletal: Degenerative changes in the spine. No other acute bony abnormalities. IMPRESSION: 1. Multi focal infiltrate involving the lower lobes, left greater than right, and the left upper lobe is most likely an infectious or inflammatory process. Recommend short-term follow-up to ensure resolution. 2. 4 mm nodule in the right upper lobe. Recommend attention on short-term follow-up. Electronically Signed   By: Gerome Samavid  Williams III M.D   On: 11/17/2015 19:28     Discharge Exam:     Vitals:   11/21/15 0441 11/21/15 1226  BP: 114/61 (!) 96/52  Pulse: 65 61  Resp: 18 18  Temp: 98.5 F (36.9 C) 98.2 F (36.8 C)         Vitals:   11/20/15 1152 11/20/15 2011 11/21/15 0441 11/21/15 1226  BP: (!) 101/56 109/60 114/61 (!) 96/52  Pulse: (!) 57 72 65 61  Resp: 18 18 18 18   Temp: 97.4 F (36.3 C) 98.2 F (36.8 C) 98.5 F (36.9 C) 98.2 F (36.8 C)  TempSrc: Oral Oral Oral Oral  SpO2: 94% 93% 90% 93%  Weight:   64.8 kg (142 lb 14.4 oz)   Height:        General: Pt is alert, follows commands appropriately, not in acute distress Cardiovascular: Regular rate and rhythm, S1/S2 +, no rubs, no gallops Respiratory: rhonchi at bases L > R side, no wheezing  Abdominal: Soft, non tender, non distended, bowel sounds +, no guarding   Discharge Instructions      Discharge Instructions    [facility]    Complete by:  As directed   Reason for consult:  Post hospital follow up, admissions   Diagnoses of:  COPD/  Pneumonia   Expected date of contact:  1-3 days (reserved for hospital discharges)   Please assign to community nurse for transition of care calls and assess for home visits. PLEASE CALL AFTER 12 NOON!  Questions please call:   Charlesetta ShanksVictoria Brewer, RN BSN CCM [facility] Liaison  [phone] business mobile phone Toll free office [phone]   Diet - low sodium heart healthy    Complete by:  As directed   Increase activity slowly    Complete by:  As directed       Medication List    STOP taking these medications   carvedilol 3.125 MG tablet Commonly known as:  COREG  furosemide 20 MG tablet Commonly known as:  LASIX  losartan 25 MG tablet Commonly known as:  COZAAR    TAKE these medications   amiodarone 200 MG tablet Commonly known as:  PACERONE Take 200 mg by mouth Mon-Fri and 400 mg on Sat and Sun. Please start taking once completed therapy with Levaquin on October 25th, 2017. Start taking on:  11/29/2015 What changed:  additional instructions  anti-nausea solution Take 10 mLs by mouth every 15 (fifteen) minutes as needed for nausea or vomiting.  apixaban 5 MG Tabs tablet Commonly known as:  ELIQUIS Take 1 tablet (5 mg total) by mouth 2 (two) times daily. Resume 09/29/15  aspirin 81 MG EC tablet Take 1 tablet (81 mg total) by mouth daily. What changed:  when to take this  benzonatate 200 MG capsule Commonly known as:  TESSALON TAKE 1 CAPSULE BY MOUTH 3 TIMES DAILY AS NEEDED FOR COUGH What changed:  See the new instructions.  diazepam 5 MG tablet Commonly known as:  VALIUM TAKE 1 TABLET BY MOUTH EVERY 6 HOURS AS NEEDED What changed:  See the new instructions.  donepezil 10 MG tablet Commonly known as:  ARICEPT Take 1 tablet (10 mg total) by mouth at bedtime.  fexofenadine 180 MG tablet Commonly known as:  ALLEGRA Take 180 mg by mouth daily as needed for allergies or rhinitis.  Gerhardt's butt cream Crea Apply 1 application topically every 6  (six) hours as needed for irritation.  guaiFENesin 600 MG 12 hr tablet Commonly known as:  MUCINEX Take 1 tablet (600 mg total) by mouth 2 (two) times daily.  guaiFENesin-dextromethorphan 100-10 MG/5ML syrup Commonly known as:  ROBITUSSIN DM Take 5 mLs by mouth every 4 (four) hours as needed for cough.  HYDROcodone-acetaminophen 5-325 MG tablet Commonly known as:  NORCO/VICODIN TAKE 1 TABLET BY MOUTH EVERY 6 HOURS AS NEEDED FOR PAIN What changed:  how much to take  how to take this  when to take this  reasons to take this  additional instructions  levalbuterol 1.25 MG/0.5ML nebulizer solution Commonly known as:  XOPENEX Take 1.25 mg by nebulization every 6 (six) hours as needed for wheezing or shortness of breath.  levofloxacin 750 MG tablet Commonly known as:  LEVAQUIN Take 1 tablet (750 mg total) by mouth daily. Start taking on:  11/22/2015  loperamide 2 MG capsule Commonly known as:  IMODIUM Take 1 capsule (2 mg total) by mouth as needed for diarrhea or loose stools.  metaxalone 800 MG tablet Commonly known as:  SKELAXIN TAKE 1 TABLET BY MOUTH 3 TIMES A DAY AS NEEDED FOR PAIN. What changed:  how much to take  how to take this  when to take this  reasons to take this  additional instructions  MULTIVITAMIN ADULTS 50+ Tabs Take 1 tablet by mouth daily.  nitroGLYCERIN 0.4 MG SL tablet Commonly known as:  NITROSTAT PLACE 1 TABLET (0.4 MG TOTAL) UNDER THE TONGUE EVERY 5 (FIVE) MINUTES AS NEEDED. FOR CHEST PAIN. What changed:  See the new instructions.  oxymetazoline 0.05 % nasal spray Commonly known as:  AFRIN Place 1 spray into both nostrils 2 (two) times daily as needed for congestion.  promethazine 25 MG tablet Commonly known as:  PHENERGAN Take 0.5-1 tablets (12.5-25 mg total) by mouth every 8 (eight) hours as needed for nausea or vomiting.  ranitidine 150 MG capsule Commonly known as:  ZANTAC Take 150 mg by mouth daily as needed for heartburn.  ranolazine  500 MG 12 hr tablet Commonly known as:  RANEXA Take 1 tablet (500 mg total) by mouth 2 (two) times daily.  traMADol 50 MG tablet Commonly known as:  ULTRAM TAKE 1 TO 2 TABLETS BY MOUTH EVERY  8 HOURS AS NEEDED What changed:  See the new instructions.  zolpidem 12.5 MG CR tablet Commonly known as:  AMBIEN CR TAKE 1 TABLET BY MOUTH AT BEDTIME What changed:  See the new instructions.    --------------------------------  Admitted for pneumonia. The above was discussed with him. He is here today with his wife. CT is noted below.  CT noted and d/w pt IMPRESSION: 1. Multi focal infiltrate involving the lower lobes, left greater than right, and the left upper lobe is most likely an infectious or inflammatory process. Recommend short-term follow-up to ensure resolution. 2. 4 mm nodule in the right upper lobe. Recommend attention on short-term follow-up.  To recap, he was admitted with pneumonia, treated with antibiotics and supplemental oxygen. He really wanted to go home, see was discharged with short-term follow-up. He is due for repeat x-ray today. He is still on 2 L of oxygen at home up until the point of the office visit. His breathing is better on oxygen. He is still on antibiotics, but he is holding his amiodarone in the meantime. There are multiple changes made to his blood pressure medications, with the restart to be dependent on his blood pressure and pulse. He has follow-up with pulmonary pending. Overall he feels some better than he did at time of discharge, but he is not back to baseline.  Recheck pulse ox was 87% on 2 L. I increased him to 3 L of nasal cannula, his pulse ox increased to around 92%. There was some variability, but it was clearly in the 90s and clearly improved on 3 L. He felt some better on 3 L of oxygen.  PMH and SH reviewed  ROS: Per HPI unless specifically indicated in ROS section   Meds, vitals, and allergies reviewed.   GEN: nad, alert and pleasant in  conversation, speaking in complete sentences HEENT: mucous membranes moist NECK: supple w/o LA CV: rrr. PULM: ctab, no inc wob, no focal decrease in breath sounds ABD: soft, +bs EXT: no edema SKIN: no acute rash

## 2015-11-24 NOTE — Progress Notes (Signed)
Pre visit review using our clinic review tool, if applicable. No additional management support is needed unless otherwise documented below in the visit note. 

## 2015-11-24 NOTE — Telephone Encounter (Signed)
Please send order for O2 at 3L via nasal cannula.  Thanks.

## 2015-11-24 NOTE — Telephone Encounter (Signed)
Wife phoned in saying that [facility] is saying that they need a new order for O2 for the patient indicating that he needs 3L.  Fax order to [phone].

## 2015-11-24 NOTE — Patient Instructions (Addendum)
Go to the lab on the way out.  We'll contact you with your xray report. If you feel worse or can't keep stats >90% on 3L of O2, then go to the hospital.  Use the nebulizer in the meantime to see if that helps.  We'll be in touch.  Take care.  Glad to see you.

## 2015-11-26 DIAGNOSIS — J189 Pneumonia, unspecified organism: Secondary | ICD-10-CM | POA: Insufficient documentation

## 2015-11-26 NOTE — Assessment & Plan Note (Signed)
Currently off carvedilol and losartan. Remain off these medications for now. He is off amiodarone while on Levaquin. He has follow with pulmonary pending. Chest x-ray shows mild improvement in his pneumonia. He has follow-up with pulmonary pending. I will defer to pulmonary about further imaging for the pneumonia and for the possible pulmonary nodule previously noted. I would greatly appreciate pulmonary input. In the meantime continue antibiotics and continue oxygen at 3 L per nasal cannula. If he is worsening in the meantime then he is to go to the emergency room.  We can adjust his beta blocker and ARB use later on as his blood pressure allows. I would have him continue on antibiotics, use the higher flow rate of oxygen, and then follow-up with pulmonary for input. At this point he appears to be okay for outpatient follow-up. He and his wife agree with plan.

## 2015-11-27 ENCOUNTER — Telehealth: Payer: Self-pay

## 2015-11-27 ENCOUNTER — Encounter: Payer: Self-pay | Admitting: *Deleted

## 2015-11-27 ENCOUNTER — Other Ambulatory Visit: Payer: Self-pay | Admitting: *Deleted

## 2015-11-27 NOTE — Patient Outreach (Signed)
[facility] Customer service manager [facility]) Care Management [facility] Community CM Telephone Outreach, Transition of care day 4 Care Coordination communication, PCP office 11/27/2015  [name] Jan 29, 1946 [id number]  Unsuccessful telephone outreach to [name], 70 y/o male, referred to [facility] Community CM for transition of care after recent hospitalization October 13-17, 2017 for HCAP, hypoxia, and AKI secondary to dehydration. Patient was treated with antibiotics, IV fluids, and supportive care. Most recent admission is second hospitalization after October 2-4, 2017 visit, also for HCAP and Acute Respiratory Failure. Patient noted to have history including but not limited to, COPD, sCHF, CAD with CABG, V-fib with AICD placement, A-Fib, bradycardia, and GERD.  I spoke with patient and his wife on Friday November 24, 2015, at which time today's call was scheduled around patient's request to be contacted on Monday 11/27/15 at 1:00 pm on his wife's cell phone.  Unfortunately, patient nor his wife answered call that was previously scheduled around the [facility] requested time frame. HIPAA compliant voice mail message left for patient/ wife, requesting return call back.  Prior to making patient call attempt, I received secure voice mail message from [place] at Dr. Lianne Bushy office confirming that home health [facility]) [facility] through [facility]) had been ordered by Dr. Para March.  Plan:  Will re-attempt [facility] Community CM telephone outreach for transition of care again tomorrow if I do not hear back from patient first  Caryl Pina, RN, BSN, [employer] Coordinator [facility] Care Management  [phone]

## 2015-11-27 NOTE — Telephone Encounter (Signed)
Left detailed message on voicemail of [name] and faxed order to [facility].

## 2015-11-27 NOTE — Telephone Encounter (Signed)
[place]ane Care mgr with [facility] left v/m; when pt was discharged from hospital the referral for home health PT did not get done. Lane request verbal order for [facility] PT to [facility]. Please see separate phone note from [place] on 11/27/15.

## 2015-11-27 NOTE — Patient Outreach (Signed)
[facility] Customer service manager [facility]) Care Management [facility] CM Telephone Outreach, Care Coordination x2 11/27/2015  RAYHAN ABRAHAMS 1945-05-29 [id number]  Care coordination telephone outreach re: Lyndale Maragh, 70 y/o male, referred to [facility] CM for transition of care after recent hospitalization October 13-17, 2017 for HCAP, hypoxia, and AKI secondary to dehydration. Patient was treated with antibiotics, IV fluids, and supportive care. Most recent admission is second hospitalization after October 2-4, 2017 visit, also for HCAP and Acute Respiratory Failure. Patient noted to have history including but not limited to, COPD, sCHF, CAD with CABG, V-fib with AICD placement, A-Fib, bradycardia, and GERD.   During successful patient contact on Friday, November 24, 2015, patient reported that home health [facility]) PT had not yet contacted him to arrange patient follow up; patient reported he was told at time of discharge that he would have [facility] PT services through [facility]).  Verified through EMR that [facility] PT was "arranged" by discharging MD, although I saw no Care Management notes addressing same.  Called [facility] this morning and spoke to "Fayrene Fearing" who verifies that there is no current order with [facility] PT, although he acknowledged they are currently following for DME.  Called patient's PCP and spoke with "Lyla Son" who connected me to nurse triage line.  Left detailed VM message on Dr. Lianne Bushy nurse triage line, requesting follow up on [facility] PT orders that were apparently intended at the time of patient hospital discharge on November 21, 2015.  Plan:  Will await follow up from PCP regarding status of [facility] PT services for patient.  Caryl Pina, RN, BSN, [facility] Care Management  [phone]

## 2015-11-27 NOTE — Telephone Encounter (Signed)
Please give the order.  Routed to [employer] in the meantime. Thanks.

## 2015-11-28 ENCOUNTER — Inpatient Hospital Stay (HOSPITAL_COMMUNITY)
Admission: EM | Admit: 2015-11-28 | Discharge: 2015-12-06 | DRG: 189 | Disposition: A | Discharge status: Rehabilitation Facility | Payer: Medicare Other | Attending: Internal Medicine | Admitting: Internal Medicine

## 2015-11-28 ENCOUNTER — Ambulatory Visit: Payer: Self-pay | Admitting: *Deleted

## 2015-11-28 ENCOUNTER — Emergency Department (HOSPITAL_COMMUNITY): Payer: Medicare Other

## 2015-11-28 ENCOUNTER — Inpatient Hospital Stay: Payer: Medicare Other | Admitting: Internal Medicine

## 2015-11-28 ENCOUNTER — Other Ambulatory Visit: Payer: Self-pay | Admitting: *Deleted

## 2015-11-28 ENCOUNTER — Encounter (HOSPITAL_COMMUNITY): Payer: Self-pay | Admitting: Emergency Medicine

## 2015-11-28 DIAGNOSIS — Z880 Allergy status to penicillin: Secondary | ICD-10-CM | POA: Diagnosis not present

## 2015-11-28 DIAGNOSIS — J189 Pneumonia, unspecified organism: Secondary | ICD-10-CM

## 2015-11-28 DIAGNOSIS — J449 Chronic obstructive pulmonary disease, unspecified: Secondary | ICD-10-CM | POA: Diagnosis present

## 2015-11-28 DIAGNOSIS — Z8719 Personal history of other diseases of the digestive system: Secondary | ICD-10-CM | POA: Diagnosis not present

## 2015-11-28 DIAGNOSIS — Z885 Allergy status to narcotic agent status: Secondary | ICD-10-CM | POA: Diagnosis not present

## 2015-11-28 DIAGNOSIS — E876 Hypokalemia: Secondary | ICD-10-CM | POA: Diagnosis present

## 2015-11-28 DIAGNOSIS — I480 Paroxysmal atrial fibrillation: Secondary | ICD-10-CM | POA: Diagnosis present

## 2015-11-28 DIAGNOSIS — I5023 Acute on chronic systolic (congestive) heart failure: Secondary | ICD-10-CM | POA: Diagnosis not present

## 2015-11-28 DIAGNOSIS — F419 Anxiety disorder, unspecified: Secondary | ICD-10-CM | POA: Diagnosis present

## 2015-11-28 DIAGNOSIS — F418 Other specified anxiety disorders: Secondary | ICD-10-CM | POA: Diagnosis not present

## 2015-11-28 DIAGNOSIS — J432 Centrilobular emphysema: Secondary | ICD-10-CM | POA: Diagnosis not present

## 2015-11-28 DIAGNOSIS — G4700 Insomnia, unspecified: Secondary | ICD-10-CM | POA: Diagnosis present

## 2015-11-28 DIAGNOSIS — D649 Anemia, unspecified: Secondary | ICD-10-CM | POA: Diagnosis present

## 2015-11-28 DIAGNOSIS — I2510 Atherosclerotic heart disease of native coronary artery without angina pectoris: Secondary | ICD-10-CM | POA: Diagnosis present

## 2015-11-28 DIAGNOSIS — J9601 Acute respiratory failure with hypoxia: Secondary | ICD-10-CM | POA: Diagnosis present

## 2015-11-28 DIAGNOSIS — R748 Abnormal levels of other serum enzymes: Secondary | ICD-10-CM | POA: Diagnosis not present

## 2015-11-28 DIAGNOSIS — E860 Dehydration: Secondary | ICD-10-CM | POA: Diagnosis present

## 2015-11-28 DIAGNOSIS — E785 Hyperlipidemia, unspecified: Secondary | ICD-10-CM | POA: Diagnosis not present

## 2015-11-28 DIAGNOSIS — R413 Other amnesia: Secondary | ICD-10-CM | POA: Diagnosis present

## 2015-11-28 DIAGNOSIS — I472 Ventricular tachycardia: Secondary | ICD-10-CM | POA: Diagnosis not present

## 2015-11-28 DIAGNOSIS — R55 Syncope and collapse: Secondary | ICD-10-CM | POA: Diagnosis not present

## 2015-11-28 DIAGNOSIS — J984 Other disorders of lung: Secondary | ICD-10-CM | POA: Diagnosis present

## 2015-11-28 DIAGNOSIS — R740 Nonspecific elevation of levels of transaminase and lactic acid dehydrogenase [LDH]: Secondary | ICD-10-CM | POA: Diagnosis present

## 2015-11-28 DIAGNOSIS — I959 Hypotension, unspecified: Secondary | ICD-10-CM | POA: Diagnosis not present

## 2015-11-28 DIAGNOSIS — I4901 Ventricular fibrillation: Secondary | ICD-10-CM | POA: Diagnosis not present

## 2015-11-28 DIAGNOSIS — I2582 Chronic total occlusion of coronary artery: Secondary | ICD-10-CM | POA: Diagnosis present

## 2015-11-28 DIAGNOSIS — R778 Other specified abnormalities of plasma proteins: Secondary | ICD-10-CM

## 2015-11-28 DIAGNOSIS — I5022 Chronic systolic (congestive) heart failure: Secondary | ICD-10-CM

## 2015-11-28 DIAGNOSIS — T462X1S Poisoning by other antidysrhythmic drugs, accidental (unintentional), sequela: Secondary | ICD-10-CM | POA: Diagnosis not present

## 2015-11-28 DIAGNOSIS — Z95810 Presence of automatic (implantable) cardiac defibrillator: Secondary | ICD-10-CM

## 2015-11-28 DIAGNOSIS — I110 Hypertensive heart disease with heart failure: Secondary | ICD-10-CM | POA: Diagnosis present

## 2015-11-28 DIAGNOSIS — Z888 Allergy status to other drugs, medicaments and biological substances status: Secondary | ICD-10-CM | POA: Diagnosis not present

## 2015-11-28 DIAGNOSIS — R7401 Elevation of levels of liver transaminase levels: Secondary | ICD-10-CM | POA: Diagnosis present

## 2015-11-28 DIAGNOSIS — E440 Moderate protein-calorie malnutrition: Secondary | ICD-10-CM | POA: Diagnosis present

## 2015-11-28 DIAGNOSIS — R0600 Dyspnea, unspecified: Secondary | ICD-10-CM

## 2015-11-28 DIAGNOSIS — T462X5A Adverse effect of other antidysrhythmic drugs, initial encounter: Secondary | ICD-10-CM | POA: Diagnosis present

## 2015-11-28 DIAGNOSIS — J9621 Acute and chronic respiratory failure with hypoxia: Principal | ICD-10-CM | POA: Diagnosis present

## 2015-11-28 DIAGNOSIS — I255 Ischemic cardiomyopathy: Secondary | ICD-10-CM | POA: Diagnosis not present

## 2015-11-28 DIAGNOSIS — I482 Chronic atrial fibrillation: Secondary | ICD-10-CM | POA: Diagnosis present

## 2015-11-28 DIAGNOSIS — N179 Acute kidney failure, unspecified: Secondary | ICD-10-CM | POA: Diagnosis not present

## 2015-11-28 DIAGNOSIS — R7989 Other specified abnormal findings of blood chemistry: Secondary | ICD-10-CM

## 2015-11-28 DIAGNOSIS — Z881 Allergy status to other antibiotic agents status: Secondary | ICD-10-CM

## 2015-11-28 DIAGNOSIS — Z7901 Long term (current) use of anticoagulants: Secondary | ICD-10-CM

## 2015-11-28 DIAGNOSIS — R071 Chest pain on breathing: Secondary | ICD-10-CM | POA: Diagnosis not present

## 2015-11-28 DIAGNOSIS — Z7982 Long term (current) use of aspirin: Secondary | ICD-10-CM

## 2015-11-28 DIAGNOSIS — J80 Acute respiratory distress syndrome: Secondary | ICD-10-CM | POA: Diagnosis not present

## 2015-11-28 DIAGNOSIS — R197 Diarrhea, unspecified: Secondary | ICD-10-CM | POA: Diagnosis present

## 2015-11-28 DIAGNOSIS — I252 Old myocardial infarction: Secondary | ICD-10-CM

## 2015-11-28 DIAGNOSIS — Z8679 Personal history of other diseases of the circulatory system: Secondary | ICD-10-CM | POA: Diagnosis not present

## 2015-11-28 DIAGNOSIS — D72828 Other elevated white blood cell count: Secondary | ICD-10-CM | POA: Diagnosis present

## 2015-11-28 DIAGNOSIS — Z87891 Personal history of nicotine dependence: Secondary | ICD-10-CM

## 2015-11-28 DIAGNOSIS — M6281 Muscle weakness (generalized): Secondary | ICD-10-CM

## 2015-11-28 DIAGNOSIS — D638 Anemia in other chronic diseases classified elsewhere: Secondary | ICD-10-CM | POA: Diagnosis not present

## 2015-11-28 DIAGNOSIS — I25110 Atherosclerotic heart disease of native coronary artery with unstable angina pectoris: Secondary | ICD-10-CM | POA: Diagnosis present

## 2015-11-28 DIAGNOSIS — J42 Unspecified chronic bronchitis: Secondary | ICD-10-CM | POA: Diagnosis not present

## 2015-11-28 DIAGNOSIS — I4819 Other persistent atrial fibrillation: Secondary | ICD-10-CM | POA: Diagnosis present

## 2015-11-28 DIAGNOSIS — R911 Solitary pulmonary nodule: Secondary | ICD-10-CM | POA: Diagnosis present

## 2015-11-28 DIAGNOSIS — R5381 Other malaise: Secondary | ICD-10-CM | POA: Diagnosis present

## 2015-11-28 DIAGNOSIS — R918 Other nonspecific abnormal finding of lung field: Secondary | ICD-10-CM

## 2015-11-28 DIAGNOSIS — I25119 Atherosclerotic heart disease of native coronary artery with unspecified angina pectoris: Secondary | ICD-10-CM | POA: Diagnosis not present

## 2015-11-28 DIAGNOSIS — Z79899 Other long term (current) drug therapy: Secondary | ICD-10-CM

## 2015-11-28 DIAGNOSIS — R0602 Shortness of breath: Secondary | ICD-10-CM | POA: Diagnosis not present

## 2015-11-28 DIAGNOSIS — J810 Acute pulmonary edema: Secondary | ICD-10-CM | POA: Diagnosis not present

## 2015-11-28 DIAGNOSIS — J8410 Pulmonary fibrosis, unspecified: Secondary | ICD-10-CM | POA: Diagnosis present

## 2015-11-28 DIAGNOSIS — Z9981 Dependence on supplemental oxygen: Secondary | ICD-10-CM | POA: Diagnosis not present

## 2015-11-28 DIAGNOSIS — R0902 Hypoxemia: Secondary | ICD-10-CM | POA: Diagnosis not present

## 2015-11-28 DIAGNOSIS — J9600 Acute respiratory failure, unspecified whether with hypoxia or hypercapnia: Secondary | ICD-10-CM | POA: Diagnosis not present

## 2015-11-28 DIAGNOSIS — Z77090 Contact with and (suspected) exposure to asbestos: Secondary | ICD-10-CM | POA: Diagnosis present

## 2015-11-28 DIAGNOSIS — T462X1A Poisoning by other antidysrhythmic drugs, accidental (unintentional), initial encounter: Secondary | ICD-10-CM | POA: Diagnosis not present

## 2015-11-28 DIAGNOSIS — J410 Simple chronic bronchitis: Secondary | ICD-10-CM

## 2015-11-28 LAB — CBC WITH DIFFERENTIAL/PLATELET
BASOS PCT: 0 %
Basophils Absolute: 0 10*3/uL (ref 0.0–0.1)
EOS ABS: 0.2 10*3/uL (ref 0.0–0.7)
EOS PCT: 2 %
HCT: 30.8 % — ABNORMAL LOW (ref 39.0–52.0)
HEMOGLOBIN: 9.7 g/dL — AB (ref 13.0–17.0)
LYMPHS ABS: 0.8 10*3/uL (ref 0.7–4.0)
Lymphocytes Relative: 8 %
MCH: 27.6 pg (ref 26.0–34.0)
MCHC: 31.5 g/dL (ref 30.0–36.0)
MCV: 87.5 fL (ref 78.0–100.0)
MONO ABS: 1 10*3/uL (ref 0.1–1.0)
MONOS PCT: 10 %
NEUTROS PCT: 80 %
Neutro Abs: 8 10*3/uL — ABNORMAL HIGH (ref 1.7–7.7)
Platelets: 411 10*3/uL — ABNORMAL HIGH (ref 150–400)
RBC: 3.52 MIL/uL — ABNORMAL LOW (ref 4.22–5.81)
RDW: 14.9 % (ref 11.5–15.5)
WBC: 10 10*3/uL (ref 4.0–10.5)

## 2015-11-28 LAB — BLOOD GAS, ARTERIAL
Acid-Base Excess: 0.7 mmol/L (ref 0.0–2.0)
BICARBONATE: 24.5 mmol/L (ref 20.0–28.0)
DRAWN BY: 406621
FIO2: 45
O2 Saturation: 95.2 %
PH ART: 7.433 (ref 7.350–7.450)
PO2 ART: 77.1 mmHg — AB (ref 83.0–108.0)
Patient temperature: 98.6
pCO2 arterial: 37.3 mmHg (ref 32.0–48.0)

## 2015-11-28 LAB — VITAMIN B12: Vitamin B-12: 450 pg/mL (ref 180–914)

## 2015-11-28 LAB — RETICULOCYTES
RBC.: 3.53 MIL/uL — AB (ref 4.22–5.81)
RETIC COUNT ABSOLUTE: 88.3 10*3/uL (ref 19.0–186.0)
Retic Ct Pct: 2.5 % (ref 0.4–3.1)

## 2015-11-28 LAB — IRON AND TIBC
Iron: 26 ug/dL — ABNORMAL LOW (ref 45–182)
Saturation Ratios: 13 % — ABNORMAL LOW (ref 17.9–39.5)
TIBC: 206 ug/dL — ABNORMAL LOW (ref 250–450)
UIBC: 180 ug/dL

## 2015-11-28 LAB — TSH: TSH: 0.035 u[IU]/mL — AB (ref 0.350–4.500)

## 2015-11-28 LAB — URINE MICROSCOPIC-ADD ON: RBC / HPF: NONE SEEN RBC/hpf (ref 0–5)

## 2015-11-28 LAB — C-REACTIVE PROTEIN: CRP: 10.9 mg/dL — AB (ref <1.0)

## 2015-11-28 LAB — COMPREHENSIVE METABOLIC PANEL
ALBUMIN: 2 g/dL — AB (ref 3.5–5.0)
ALK PHOS: 76 U/L (ref 38–126)
ALT: 80 U/L — AB (ref 17–63)
AST: 109 U/L — ABNORMAL HIGH (ref 15–41)
Anion gap: 6 (ref 5–15)
BILIRUBIN TOTAL: 1.1 mg/dL (ref 0.3–1.2)
BUN: 17 mg/dL (ref 6–20)
CALCIUM: 7.4 mg/dL — AB (ref 8.9–10.3)
CO2: 25 mmol/L (ref 22–32)
CREATININE: 0.98 mg/dL (ref 0.61–1.24)
Chloride: 109 mmol/L (ref 101–111)
GFR calc Af Amer: >60 mL/min (ref >60)
GFR calc non Af Amer: >60 mL/min (ref >60)
GLUCOSE: 121 mg/dL — AB (ref 65–99)
Potassium: 4.1 mmol/L (ref 3.5–5.1)
SODIUM: 140 mmol/L (ref 135–145)
TOTAL PROTEIN: 5 g/dL — AB (ref 6.5–8.1)

## 2015-11-28 LAB — TROPONIN I
TROPONIN I: 0.04 ng/mL — AB (ref <0.03)
Troponin I: 0.05 ng/mL (ref <0.03)

## 2015-11-28 LAB — FERRITIN: FERRITIN: 312 ng/mL (ref 24–336)

## 2015-11-28 LAB — URINALYSIS, ROUTINE W REFLEX MICROSCOPIC
GLUCOSE, UA: NEGATIVE mg/dL
HGB URINE DIPSTICK: NEGATIVE
KETONES UR: NEGATIVE mg/dL
Nitrite: POSITIVE — AB
PROTEIN: 30 mg/dL — AB
Specific Gravity, Urine: 1.03 (ref 1.005–1.030)
pH: 5.5 (ref 5.0–8.0)

## 2015-11-28 LAB — SEDIMENTATION RATE: SED RATE: 40 mm/h — AB (ref 0–16)

## 2015-11-28 LAB — MAGNESIUM: Magnesium: 1.9 mg/dL (ref 1.7–2.4)

## 2015-11-28 LAB — FOLATE: FOLATE: 34.9 ng/mL (ref >5.9)

## 2015-11-28 LAB — BRAIN NATRIURETIC PEPTIDE: B Natriuretic Peptide: 494 pg/mL — ABNORMAL HIGH (ref 0.0–100.0)

## 2015-11-28 LAB — STREP PNEUMONIAE URINARY ANTIGEN: STREP PNEUMO URINARY ANTIGEN: NEGATIVE

## 2015-11-28 LAB — PROCALCITONIN: Procalcitonin: <0.1 ng/mL

## 2015-11-28 LAB — I-STAT CG4 LACTIC ACID, ED: LACTIC ACID, VENOUS: 1.14 mmol/L (ref 0.5–1.9)

## 2015-11-28 LAB — PHOSPHORUS: PHOSPHORUS: 2.5 mg/dL (ref 2.5–4.6)

## 2015-11-28 MED ORDER — LORATADINE 10 MG PO TABS
10.0000 mg | ORAL_TABLET | Freq: Every day | ORAL | Status: DC
  Administered 2015-11-28 – 2015-12-06 (×9): 10 mg via ORAL
  Filled 2015-11-28 (×9): qty 1

## 2015-11-28 MED ORDER — FUROSEMIDE 10 MG/ML IJ SOLN
40.0000 mg | Freq: Once | INTRAMUSCULAR | Status: AC
  Administered 2015-11-28: 40 mg via INTRAVENOUS
  Filled 2015-11-28: qty 4

## 2015-11-28 MED ORDER — GUAIFENESIN ER 600 MG PO TB12
600.0000 mg | ORAL_TABLET | Freq: Two times a day (BID) | ORAL | Status: DC
  Administered 2015-11-28 – 2015-12-06 (×15): 600 mg via ORAL
  Filled 2015-11-28 (×18): qty 1

## 2015-11-28 MED ORDER — DONEPEZIL HCL 10 MG PO TABS
10.0000 mg | ORAL_TABLET | Freq: Every day | ORAL | Status: DC
  Administered 2015-11-28 – 2015-12-05 (×8): 10 mg via ORAL
  Filled 2015-11-28 (×8): qty 1

## 2015-11-28 MED ORDER — RANOLAZINE ER 500 MG PO TB12
500.0000 mg | ORAL_TABLET | Freq: Two times a day (BID) | ORAL | Status: DC
  Administered 2015-11-28 – 2015-12-06 (×17): 500 mg via ORAL
  Filled 2015-11-28 (×17): qty 1

## 2015-11-28 MED ORDER — ADULT MULTIVITAMIN W/MINERALS CH
1.0000 | ORAL_TABLET | Freq: Every day | ORAL | Status: DC
  Administered 2015-11-28 – 2015-12-06 (×9): 1 via ORAL
  Filled 2015-11-28 (×10): qty 1

## 2015-11-28 MED ORDER — METAXALONE 800 MG PO TABS
800.0000 mg | ORAL_TABLET | Freq: Three times a day (TID) | ORAL | Status: DC | PRN
  Administered 2015-11-29: 800 mg via ORAL
  Filled 2015-11-28: qty 1

## 2015-11-28 MED ORDER — DEXTROSE 5 % IV SOLN
1.0000 g | Freq: Three times a day (TID) | INTRAVENOUS | Status: DC
  Administered 2015-11-28 – 2015-12-02 (×12): 1 g via INTRAVENOUS
  Filled 2015-11-28 (×14): qty 1

## 2015-11-28 MED ORDER — BENZONATATE 100 MG PO CAPS
200.0000 mg | ORAL_CAPSULE | ORAL | Status: DC | PRN
  Administered 2015-12-03: 200 mg via ORAL
  Filled 2015-11-28: qty 2

## 2015-11-28 MED ORDER — DEXTROSE 5 % IV SOLN
1.0000 g | Freq: Three times a day (TID) | INTRAVENOUS | Status: DC
  Filled 2015-11-28 (×3): qty 1

## 2015-11-28 MED ORDER — SODIUM CHLORIDE 0.9% FLUSH
3.0000 mL | Freq: Two times a day (BID) | INTRAVENOUS | Status: DC
  Administered 2015-11-28 – 2015-12-04 (×10): 3 mL via INTRAVENOUS

## 2015-11-28 MED ORDER — METHYLPREDNISOLONE SODIUM SUCC 40 MG IJ SOLR
40.0000 mg | Freq: Two times a day (BID) | INTRAMUSCULAR | Status: AC
  Administered 2015-11-28 – 2015-12-03 (×12): 40 mg via INTRAVENOUS
  Filled 2015-11-28 (×12): qty 1

## 2015-11-28 MED ORDER — FAMOTIDINE 20 MG PO TABS
20.0000 mg | ORAL_TABLET | Freq: Every day | ORAL | Status: DC
  Administered 2015-11-28 – 2015-12-06 (×9): 20 mg via ORAL
  Filled 2015-11-28 (×9): qty 1

## 2015-11-28 MED ORDER — VANCOMYCIN HCL IN DEXTROSE 1-5 GM/200ML-% IV SOLN
1000.0000 mg | Freq: Once | INTRAVENOUS | Status: AC
  Administered 2015-11-28: 1000 mg via INTRAVENOUS
  Filled 2015-11-28: qty 200

## 2015-11-28 MED ORDER — HYDROCODONE-ACETAMINOPHEN 5-325 MG PO TABS
1.0000 | ORAL_TABLET | Freq: Four times a day (QID) | ORAL | Status: DC | PRN
Start: 2015-11-28 — End: 2015-12-06
  Administered 2015-11-29 – 2015-12-06 (×2): 1 via ORAL
  Filled 2015-11-28 (×2): qty 1

## 2015-11-28 MED ORDER — DEXTROSE 5 % IV SOLN
2.0000 g | Freq: Once | INTRAVENOUS | Status: AC
  Administered 2015-11-28: 2 g via INTRAVENOUS
  Filled 2015-11-28 (×2): qty 2

## 2015-11-28 MED ORDER — ACETAMINOPHEN 325 MG PO TABS: 650.0000 mg | ORAL_TABLET | Freq: Four times a day (QID) | ORAL | Status: DC | PRN

## 2015-11-28 MED ORDER — DIAZEPAM 5 MG PO TABS
5.0000 mg | ORAL_TABLET | Freq: Four times a day (QID) | ORAL | Status: DC | PRN
  Administered 2015-11-28 – 2015-11-30 (×3): 5 mg via ORAL
  Filled 2015-11-28 (×3): qty 1

## 2015-11-28 MED ORDER — APIXABAN 5 MG PO TABS
5.0000 mg | ORAL_TABLET | Freq: Two times a day (BID) | ORAL | Status: DC
  Administered 2015-11-28 – 2015-12-06 (×17): 5 mg via ORAL
  Filled 2015-11-28 (×17): qty 1

## 2015-11-28 MED ORDER — TRAMADOL HCL 50 MG PO TABS: 50.0000 mg | ORAL_TABLET | Freq: Three times a day (TID) | ORAL | Status: DC | PRN

## 2015-11-28 MED ORDER — ZOLPIDEM TARTRATE 5 MG PO TABS
5.0000 mg | ORAL_TABLET | Freq: Every evening | ORAL | Status: DC | PRN
  Administered 2015-11-28 – 2015-12-02 (×5): 5 mg via ORAL
  Filled 2015-11-28 (×5): qty 1

## 2015-11-28 MED ORDER — ACETAMINOPHEN 650 MG RE SUPP: 650.0000 mg | Freq: Four times a day (QID) | RECTAL | Status: DC | PRN

## 2015-11-28 MED ORDER — VANCOMYCIN HCL 500 MG IV SOLR
500.0000 mg | Freq: Two times a day (BID) | INTRAVENOUS | Status: DC
  Administered 2015-11-28 – 2015-11-30 (×4): 500 mg via INTRAVENOUS
  Filled 2015-11-28 (×6): qty 500

## 2015-11-28 MED ORDER — PROMETHAZINE HCL 25 MG PO TABS
12.5000 mg | ORAL_TABLET | Freq: Four times a day (QID) | ORAL | Status: DC | PRN
  Filled 2015-11-28: qty 1

## 2015-11-28 MED ORDER — ASPIRIN EC 81 MG PO TBEC
81.0000 mg | DELAYED_RELEASE_TABLET | Freq: Every day | ORAL | Status: DC
  Administered 2015-11-28 – 2015-12-06 (×9): 81 mg via ORAL
  Filled 2015-11-28 (×10): qty 1

## 2015-11-28 MED ORDER — LEVALBUTEROL HCL 1.25 MG/0.5ML IN NEBU: 1.2500 mg | INHALATION_SOLUTION | Freq: Four times a day (QID) | RESPIRATORY_TRACT | Status: DC | PRN

## 2015-11-28 NOTE — Consult Note (Signed)
Name: [name] MRN: [id number] DOB: 06-21-1945    ADMISSION DATE:  11/28/2015 CONSULTATION DATE:  11/28/2015  REFERRING MD :  Hal Hope TRH  CHIEF COMPLAINT:  Hypoxia, diffuse infiltrates  BRIEF PATIENT DESCRIPTION: 70 year old male with extensive cardiac history and several recent admissions for HCAP which he failed several out patient antibody treatments. Now with worsening diffuse bilateral infiltrates.  SIGNIFICANT EVENTS  8/23 > admit for AICD firing 10/2 > admit for HCAP 10/13 >  Admit for HCAP 10/24 admit for worsening airspace disease and persistent hypoxia.  STUDIES:  8/24 LHC > Prox RCA lesion 100%, Mid cx 70%. EF 25% estimated. Continue medical therapy, no PCI. CT chest 10/13 > Multi focal infiltrate involving the lower lobes, left greater than right, and the left upper lobe is most likely an infectious or inflammatory process. Recommend short-term follow-up to ensure resolution. 4 mm nodule in the right upper lobe. Recommend attention on short-term follow-up. Echo 10/24 >   HISTORY OF PRESENT ILLNESS:  70 year old male past medical history as below, which is significant for ischemic artery myopathy, systolic congestive heart failure with left ventricular ejection fraction estimated at 25%, atrial fibrillation on amiodarone and Eliquis for anticoagulation, AICD in place, and COPD. He has been admitted several times recently. Initially in August 2017 when AICD fired. No cause was determined for this. He underwent LHC for this and coronaries were stable from prior exam, continued medical management was recommended. He was admitted 10/2 with suspected HCAP and treated with aztreonam and vancomycin > PO doxy. Symptoms improved. He was readmitted 10/13 and was treated with monotherapy Levaquin and discharged on home O2. 10/20 he followed up with PCP and was found to be hypoxic on 2L and was turned up to 3.  10/24 he again presented to [facility] with complaints of SOB and  hypoxia which has failed inpatient treatments for pneumonia and CHF over the past month or so. CXR showing progressive infiltrates  L>R and continued hypoxemia. He complains of dry cough only and no fevers. He has been on amiodarone since April and has no history of environmental exposures.   PAST MEDICAL HISTORY :   has a past medical history of Acute lower GI bleeding (12/11/2011); Acute myocardial infarction, unspecified site, episode of care unspecified (1995); AICD (automatic cardioverter/defibrillator) present; Allergic rhinitis, cause unspecified; Anxiety; Arthritis; Atrial fibrillation ([place]); CAD (coronary artery disease), autologous vein bypass graft; Cervical herniated disc; Chronic systolic CHF (congestive heart failure), NYHA class 2 (HCC); COPD (chronic obstructive pulmonary disease) ([place]) (11/2012); Diverticulosis; HCAP (healthcare-associated pneumonia) (11/06/2015); Hypertension; Insomnia; Paroxysmal ventricular tachycardia ([place]); Perennial allergic rhinitis; Pneumonia (2000s); and VT (ventricular tachycardia) ([place]).  has a past surgical history that includes Cardiac defibrillator placement (2004); Tonsillectomy and adenoidectomy (~ 1951); Shoulder arthroscopy w/ rotator cuff repair (Right, twice); Insert / replace / remove pacemaker (2004); Colonoscopy (01/08/2012); implantable cardioverter defibrillator generator change (N/A, 02/07/2012); Laparoscopic cholecystectomy (1/ 2012); Coronary angioplasty (1995); Cardiac catheterization (2004); Cardiac catheterization (N/A, 09/28/2015); Cataract extraction w/ intraocular lens implant (Right, 01/2012); and Knee arthroscopy (Left, 05/2003). Prior to Admission medications   Medication Sig Start Date End Date Taking? Authorizing Provider  apixaban (ELIQUIS) 5 MG TABS tablet Take 1 tablet (5 mg total) by mouth 2 (two) times daily. Resume 09/29/15 09/28/15  Yes Amber Sena Slate, NP  aspirin 81 MG EC tablet Take 1 tablet (81 mg total) by mouth daily. 05/28/15  Yes  Rogelia Mire, NP  benzonatate (TESSALON) 200 MG capsule TAKE 1 CAPSULE BY MOUTH 3  TIMES DAILY AS NEEDED FOR COUGH Patient taking differently: TAKE 1 CAPSULE (200 mg) BY MOUTH 3 TIMES DAILY AS NEEDED FOR COUGH 10/31/14  Yes Tonia Ghent, MD  diazepam (VALIUM) 5 MG tablet TAKE 1 TABLET BY MOUTH EVERY 6 HOURS AS NEEDED Patient taking differently: TAKE 1 TABLET BY MOUTH EVERY 6 HOURS AS NEEDED FOR ANXIETY 10/04/15  Yes Tonia Ghent, MD  donepezil (ARICEPT) 10 MG tablet Take 1 tablet (10 mg total) by mouth at bedtime. 11/01/15  Yes Tonia Ghent, MD  guaiFENesin (MUCINEX) 600 MG 12 hr tablet Take by mouth 2 (two) times daily.   Yes Historical Provider, MD  guaiFENesin-dextromethorphan (ROBITUSSIN DM) 100-10 MG/5ML syrup Take 5 mLs by mouth every 4 (four) hours as needed for cough. 11/21/15  Yes Theodis Blaze, MD  HYDROcodone-acetaminophen (NORCO/VICODIN) 5-325 MG tablet TAKE 1 TABLET BY MOUTH EVERY 6 HOURS AS NEEDED FOR PAIN Patient taking differently: Take 1 tablet by mouth every 6 (six) hours as needed for moderate pain.  10/10/15  Yes Tonia Ghent, MD  levalbuterol Penne Lash) 1.25 MG/0.5ML nebulizer solution Take 1.25 mg by nebulization every 6 (six) hours as needed for wheezing or shortness of breath. 11/21/15  Yes Theodis Blaze, MD  levofloxacin (LEVAQUIN) 750 MG tablet Take 1 tablet (750 mg total) by mouth daily. 11/22/15  Yes Theodis Blaze, MD  loperamide (IMODIUM) 2 MG capsule Take 1 capsule (2 mg total) by mouth as needed for diarrhea or loose stools. 11/21/15  Yes Theodis Blaze, MD  metaxalone (SKELAXIN) 800 MG tablet TAKE 1 TABLET BY MOUTH 3 TIMES A DAY AS NEEDED FOR PAIN. Patient taking differently: Take 800 mg by mouth 3 (three) times daily as needed for muscle spasms.  10/10/15  Yes Tonia Ghent, MD  Multiple Vitamins-Minerals (MULTIVITAMIN ADULTS 50+) TABS Take 1 tablet by mouth daily.   Yes Historical Provider, MD  nitroGLYCERIN (NITROSTAT) 0.4 MG SL tablet PLACE 1 TABLET (0.4  MG TOTAL) UNDER THE TONGUE EVERY 5 (FIVE) MINUTES AS NEEDED. FOR CHEST PAIN. 11/20/15  Yes Tonia Ghent, MD  promethazine (PHENERGAN) 25 MG tablet Take 0.5-1 tablets (12.5-25 mg total) by mouth every 8 (eight) hours as needed for nausea or vomiting. 11/14/15  Yes Tonia Ghent, MD  ranitidine (ZANTAC) 150 MG capsule Take 150 mg by mouth daily as needed for heartburn.    Yes Historical Provider, MD  ranolazine (RANEXA) 500 MG 12 hr tablet Take 1 tablet (500 mg total) by mouth 2 (two) times daily. 09/28/15  Yes Amber Sena Slate, NP  traMADol (ULTRAM) 50 MG tablet TAKE 1 TO 2 TABLETS BY MOUTH EVERY 8 HOURS AS NEEDED Patient taking differently: TAKE 1 TO 2 TABLETS BY MOUTH EVERY 8 HOURS AS NEEDED FOR PAIN. 10/24/15  Yes Tonia Ghent, MD  zolpidem (AMBIEN CR) 12.5 MG CR tablet TAKE 1 TABLET BY MOUTH AT BEDTIME Patient taking differently: TAKE 1 TABLET BY MOUTH AT BEDTIME AS NEEDED FOR SLEEP 07/19/15  Yes Tonia Ghent, MD  amiodarone (PACERONE) 200 MG tablet Take 200 mg by mouth Mon-Fri and 400 mg on Sat and Sun. Please start taking once completed therapy with Levaquin on October 25th, 2017. Patient not taking: Reported on 11/28/2015 11/29/15   Theodis Blaze, MD   Allergies  Allergen Reactions  . Ace Inhibitors Other (See Comments)    muscle pain. Tolerates ARBs.   . Codeine Other (See Comments)    "head wants to explode."  . Doxycycline Diarrhea  and Nausea And Vomiting  . Penicillins Swelling    "started at point of injection; w/in 3 min my upper arm was swollen 3 times normal" Has patient had a PCN reaction causing immediate rash, facial/tongue/throat swelling, SOB or lightheadedness with hypotension: Yes Has patient had a PCN reaction causing severe rash involving mucus membranes or skin necrosis: No Has patient had a PCN reaction that required hospitalization No Has patient had a PCN reaction occurring within the last 10 years: No If all of the above answers are "NO", then may proceed wi    . Lisinopril     Muscle Pain  . Statins Other (See Comments)    Myalgias per patient    FAMILY HISTORY:  family history includes Cancer in his sister; Diabetes in his father; Stroke in his mother; Tracheal cancer (age of onset: 81) in his father. SOCIAL HISTORY:  reports that he quit smoking about 2 months ago. His smoking use included Cigarettes and Cigars. He has a 25.00 pack-year smoking history. He has never used smokeless tobacco. He reports that he does not drink alcohol or use drugs.  REVIEW OF SYSTEMS:   Bolds are positive  Constitutional: weight loss, gain, night sweats, Fevers, chills, fatigue .  HEENT: headaches, Sore throat, sneezing, nasal congestion, post nasal drip, Difficulty swallowing, Tooth/dental problems, visual complaints visual changes, ear ache CV:  chest pain, radiates: ,Orthopnea, PND, swelling in lower extremities, dizziness, palpitations, syncope.  GI  heartburn, indigestion, abdominal pain, nausea, vomiting, diarrhea, change in bowel habits, loss of appetite, bloody stools.  Resp: cough, productive: , hemoptysis, dyspnea, chest pain, pleuritic.  Skin: rash or itching or icterus GU: dysuria, change in color of urine, urgency or frequency. flank pain, hematuria  MS: joint pain or swelling. decreased range of motion  Psych: change in mood or affect. depression or anxiety.  Neuro: difficulty with speech, weakness, numbness, ataxia    SUBJECTIVE:   VITAL SIGNS: Temp:  [97 F (36.1 C)-97.1 F (36.2 C)] 97 F (36.1 C) (10/24 0642) Pulse Rate:  [57-65] 57 (10/24 1201) Resp:  [13-27] 18 (10/24 1201) BP: (97-129)/(58-71) 126/66 (10/24 1201) SpO2:  [85 %-96 %] 95 % (10/24 1201) FiO2 (%):  [40 %] 40 % (10/24 1201) Weight:  [62.8 kg (138 lb 8 oz)] 62.8 kg (138 lb 8 oz) (10/24 3154)  PHYSICAL EXAMINATION: General:  Thin elderly male in NAD on venti mast Neuro:  Alert, oriented, relies on wife for memory of major details. HEENT: [employer]/AT, PERRL, no  JVD Cardiovascular:  RRR, no MRG Lungs:  Coarse L>R Abdomen: Soft non-tender, non-distended Musculoskeletal:  No acute deformity or ROM limitation Skin:  Grossly intact   Recent Labs Lab 11/28/15 0258  NA 140  K 4.1  CL 109  CO2 25  BUN 17  CREATININE 0.98  GLUCOSE 121*    Recent Labs Lab 11/28/15 0258  HGB 9.7*  HCT 30.8*  WBC 10.0  PLT 411*   Dg Chest 2 View  Result Date: 11/28/2015 CLINICAL DATA:  Shortness of breath EXAM: CHEST  2 VIEW COMPARISON:  11/24/2015, 03/06/2011, CT 11/17/2015 FINDINGS: Left-sided single lead ICD. Lead placement similar compared to previous. There is cardiomegaly. Interval increase in interstitial opacities asymmetric in the left thorax. Slight increased interstitial opacities within the right lung base. Atherosclerosis of the aorta. No pneumothorax. IMPRESSION: 1. Increased interstitial and alveolar opacities within the left greater then right chest since the previous exam. 2. Stable degree of cardiomegaly. Electronically Signed   By: Madie Reno.D.  On: 11/28/2015 04:38   Ct Head Wo Contrast  Result Date: 11/28/2015 CLINICAL DATA:  Unresponsive, seizure like activity EXAM: CT HEAD WITHOUT CONTRAST TECHNIQUE: Contiguous axial images were obtained from the base of the skull through the vertex without intravenous contrast. COMPARISON:  10/13/2015 FINDINGS: Brain: No acute territorial infarction or hemorrhage. No focal mass, mass effect or midline shift. Mild to moderate cortical atrophy. Moderate periventricular and subcortical white matter small vessel ischemic changes. Ventricles are similar in size and configuration. Vascular: No hyperdense vessels. Calcifications within the carotid arteries at the skullbase. Skull: No fracture.  Mastoid air cells clear. Sinuses/Orbits: Minimal mucosal thickening in the paranasal sinuses. No acute orbital abnormality. Other: None IMPRESSION: 1. No CT evidence for acute intracranial abnormality. 2. Moderate  atrophy. Moderate periventricular and subcortical white matter small vessel ischemic disease. Electronically Signed   By: Donavan Foil M.D.   On: 11/28/2015 04:35    ASSESSMENT / PLAN:  Hypoxia secondary to diffuse pulmonary infiltrates  At this point safe to assume this is not infectious in origin based off of treatment failures and negative PCT. Reasonable to continue ABX for a couple more days. While there may be a small degree of acute CHF, I believe this is a relatively small component of his hypoxia. Suspect a large part of this is due to inflammatory lung diease. Favoring amiodarone lung toxicity at this point based on his history, but will also need to rule out autoimmune disease. Need to start steroids and assess his response.   Plan: Supplemental O2 to keep sats > 90% Discontinue amiodarone, would consult cardiology (followed by Dr. Lovena Le EP) for best way to manage arrhythmia at this point. Solumedrol 56m BID Check ANA, ESR, CRP, Double stranded DNA, C3, C4, CH50, ANCA, Rheumatoid factor.  Urinalysis If no improvement may need to progress to open lung biopsy Will follow   PGeorgann Housekeeper AGACNP-BC LDavis Regional Medical CenterPulmonology/Critical Care Pager [phone] [phone] 11/28/2015 2:18 PM  STAFF NOTE: ILinwood Dibbles MD FACP have personally reviewed patient's available data, including medical history, events of note, physical examination and test results as part of my evaluation. I have discussed with resident/NP and other care providers such as pharmacist, RN and RRT. In addition, I personally evaluated patient and elicited key findings of: awake,no sig increase WOB, hypoxia on higher O2 mask, coarse dry crackles left base and coarse rt apical, low muslce mass, CT reviewed prior and all pcxr, likley this is NONinfectious Pneumonia/pneumonitis, I have major concerns for amiodarone, also need to r/o auto immune ( although no hemoptysis), will send full work up lab work for [facility] immune, assess esr, dc all amio of course (cards called for other options), repeat pcxr in am , wil start empiric steroids given Hypoxia and symptoms now, can continued ABX alsthough with such low clinical suspcion will assess PCT fruther and if neg in am would consider dc all ABX then, will follow, I updated pt and wife in room extensive, if we are still unsure after above, may need open lung bx   DLavon Paganini FTitus Mould MD, FMoradaPgr: 3Deer LodgePulmonary & Critical Care 11/28/2015 5:48 PM

## 2015-11-28 NOTE — Patient Outreach (Signed)
[facility]) Care Management [facility] Care Coordination Outreach 11/28/2015  [name] [birth year] [id number]  [name] is a 70 y/o male, referred to [facility] CM for transition of care after recent hospitalization October 13-17, 2017 for HCAP, hypoxia, and AKI secondary to dehydration. Patient was treated with antibiotics, IV fluids, and supportive care. Most recent admission is second hospitalization after October 2-4, 2017 visit, also for HCAP and Acute Respiratory Failure. Patient noted to have history including but not limited to, COPD, sCHF, CAD with CABG, V-fib with AICD placement, A-Fib, bradycardia, and GERD.  Unfortunately, Patient was readmitted to hospital today during early morning hours after having syncopal episode at home after using HHN.  Notified [facility] RN [facility] of patient re-admission via secure messaging through EMR.  Plan:  [facility] CM will follow and collaborate during patient's current hospitalization.  Caryl Pina, RN, BSN, [employer] [facility] Care Management  [phone]

## 2015-11-28 NOTE — Progress Notes (Signed)
Pharmacy Antibiotic Note  [name] is a 70 y.o. male admitted on 11/28/2015 with pneumonia.  Pharmacy has been consulted for vancomycin dosing.  Plan: Vancomycin 1000mg  given in ED then 500mg  IV every 12 hours.  Goal trough 15-20 mcg/mL.  Height: 5\' 3"  (160 cm) Weight: 138 lb 8 oz (62.8 kg) IBW/kg (Calculated) : 56.9  Temp (24hrs), Avg:97.1 F (36.2 C), Min:97 F (36.1 C), Max:97.1 F (36.2 C)   Recent Labs Lab 11/28/15 0258 11/28/15 0301  WBC 10.0  --   CREATININE 0.98  --   LATICACIDVEN  --  1.14    Estimated Creatinine Clearance: 55.6 mL/min (by C-G formula based on SCr of 0.98 mg/dL).    Allergies  Allergen Reactions  . Ace Inhibitors Other (See Comments)    muscle pain. Tolerates ARBs.   . Codeine Other (See Comments)    "head wants to explode."  . Doxycycline Diarrhea and Nausea And Vomiting  . Penicillins Swelling    "started at point of injection; w/in 3 min my upper arm was swollen 3 times normal" Has patient had a PCN reaction causing immediate rash, facial/tongue/throat swelling, SOB or lightheadedness with hypotension: Yes Has patient had a PCN reaction causing severe rash involving mucus membranes or skin necrosis: No Has patient had a PCN reaction that required hospitalization No Has patient had a PCN reaction occurring within the last 10 years: No If all of the above answers are "NO", then may proceed wi  . Lisinopril     Muscle Pain  . Statins Other (See Comments)    Myalgias per patient     Thank you for allowing pharmacy to be a part of this patient's care.  Vernard Gambles, PharmD, BCPS  11/28/2015 8:06 AM

## 2015-11-28 NOTE — Progress Notes (Signed)
Pt received from ED after 6.40am this morning, Vitals stable, receiving oxygen via venturi mask @10 , oxygen sat is 95% at this point, initial skin assessment is done with second RN, pt is in comfortable position right now, wife is in bedside, initial assessment done, rest of the admission part is due, will hand off to the second shift, will continue to monitor the patient.

## 2015-11-28 NOTE — ED Provider Notes (Signed)
[facility]-EMERGENCY DEPT Provider Note   CSN: [id number] Arrival date & time: 11/28/15  0200  By signing my name below, I, Rosario Adie, attest that this documentation has been prepared under the direction and in the presence of Dione Booze, MD. Electronically Signed: Rosario Adie, ED Scribe. 11/28/15. 2:53 AM.  History   Chief Complaint Chief Complaint  Patient presents with  . Shortness of Breath   The history is provided by the patient, medical records and the spouse. No language interpreter was used.   HPI Comments: [name] is a 70 y.o. male w/ pacemaker placement and PMHx including COPD, who presents to the Emergency Department complaining of recurrent, constant SOB onset approximately 2 hours PTA. Per prior chart review, pt has been seen in the ED for same twice over the past month and subsequently admitted each time. Pt was first admitted on 11/06/15 for LLL CAP. His most recent admission for this issue began on 11/17/2015 (~11 days ago), after his shortness of breath had returned again and he was found to be hypoxic while in the ED. Imaging at that time revealed persistent and worsening LLL PNA. Pt was d/c from his admission ~4 days later and was rx'd Levaquin at that time which he has been taking compliantly. Wife reports that since his d/c pt has been steadily improving, however, after she administered a nebulizer treatment tonight she found him shortly after as unresponsive which prompted her to call EMS. She additionally notes that prior to EMS arrival that he began to have an episode of seizure-like activity in which his upper extremities were described as flailing. Pt has been 2L oxygen dependent at home since being d/c from his most recent admission, which was recently elevated to 3L after seeing his PCP on on 11/24/15 (~4 days ago). He is currently on Eliquis for his h/o AFIB. Pt denies chest pain, chest tightness, bowel/bladder incontinence, or any other  associated symptoms.   PCP: Crawford Givens, MD  Cardiologist: Lewayne Bunting, MD Pulmonologist: Stephanie Acre, MD  Past Medical History:  Diagnosis Date  . Acute lower GI bleeding 12/11/2011   "first time" (12/11/2011)  . Acute myocardial infarction, unspecified site, episode of care unspecified 1995   Pt living in [place], no stent, ?PTCA  . AICD (automatic cardioverter/defibrillator) present   . Allergic rhinitis, cause unspecified   . Anxiety   . Arthritis    "all over" (11/07/2015)  . Atrial fibrillation (HCC)    a. 05/2015 - converted to sinus in setting of ICD shocks; placed on eliquis 5 bid.  [name] CAD (coronary artery disease), autologous vein bypass graft   . Cervical herniated disc    told not to lift >10 lbs  . Chronic systolic CHF (congestive heart failure), NYHA class 2 (HCC)    Reports EF of 25%.   [name] COPD (chronic obstructive pulmonary disease) (HCC) 11/2012   by xray  . Diverticulosis    by CT scan  . HCAP (healthcare-associated pneumonia) 11/06/2015  . Hypertension   . Insomnia   . Paroxysmal ventricular tachycardia (HCC)   . Perennial allergic rhinitis    only to dust mites  . Pneumonia 2000s   "walking pneumonia"  . VT (ventricular tachycardia) (HCC)    a. 05/2015 - VT storm with multiple ICD shocks-->Amio 400 BID.   Patient Active Problem List   Diagnosis Date Noted  . Multifocal pneumonia 11/26/2015  . AICD (automatic cardioverter/defibrillator) present 11/08/2015  . HCAP (healthcare-associated pneumonia) 11/07/2015  . Acute  respiratory failure with hypoxia (HCC) 11/07/2015  . Hypoxia 11/06/2015  . Fall at home 10/11/2015  . Ventricular tachycardia (HCC) 09/27/2015  . Memory loss 09/24/2015  . Encounter for screening examination for infectious disease 09/24/2015  . A-fib (HCC) 05/28/2015  . VT (ventricular tachycardia) (HCC) 05/25/2015  . Ventricular fibrillation (HCC) 05/25/2015  . VF (ventricular fibrillation) (HCC) 05/25/2015  . Syncope and collapse  05/12/2015  . Hypotension 05/12/2015  . Bradycardia 05/12/2015  . Acute non-recurrent maxillary sinusitis 03/23/2015  . Nausea with vomiting 01/05/2015  . Advance care planning 09/16/2014  . Muscle ache 09/16/2014  . Back pain 09/08/2013  . Abdominal pain, chronic, epigastric 09/08/2013  . Fatigue 06/11/2013  . Chest wall mass 11/05/2012  . Medicare annual wellness visit, subsequent 06/26/2012  . Arthritis   . Insomnia   . Anxiety   . Diverticulosis 12/11/2011  . Tobacco abuse 03/06/2011  . Ischemic cardiomyopathy 02/12/2011  . Chronic systolic CHF (congestive heart failure) (HCC) 05/10/2010  . Automatic implantable cardioverter-defibrillator in situ 08/23/2009  . Coronary atherosclerosis 10/31/2008  . Hyperlipidemia 08/25/2008  . ALLERGIC RHINITIS 08/25/2008  . COPD (chronic obstructive pulmonary disease) (HCC) 08/25/2008   Past Surgical History:  Procedure Laterality Date  . CARDIAC CATHETERIZATION  2004   LAD 30%, D1 30%, CFX-AV groove 70-80%, OM1 30%, EF 20-25%  . CARDIAC CATHETERIZATION N/A 09/28/2015   Procedure: Left Heart Cath and Coronary Angiography;  Surgeon: Peter M Swaziland, MD;  [facility] INVASIVE CV LAB;  Service: Cardiovascular;  Laterality: N/A;  . CARDIAC DEFIBRILLATOR PLACEMENT  2004  . CATARACT EXTRACTION W/ INTRAOCULAR LENS IMPLANT Right 01/2012  . COLONOSCOPY  01/08/2012   Procedure: COLONOSCOPY;  Surgeon: Iva Boop, MD;  Location: [facility] ENDOSCOPY;  Service: Endoscopy;  Laterality: N/A;  . CORONARY ANGIOPLASTY  1995   Pt thinks he got a balloon, living in [place], [place]  . IMPLANTABLE CARDIOVERTER DEFIBRILLATOR GENERATOR CHANGE N/A 02/07/2012   Procedure: IMPLANTABLE CARDIOVERTER DEFIBRILLATOR GENERATOR CHANGE;  Surgeon: Marinus Maw, MD; Medtronic Evera XT VR single-chamber serial number [id number], Laterality: Left  . INSERT / REPLACE / REMOVE PACEMAKER  2004   Medtronic ICD  . KNEE ARTHROSCOPY Left 05/2003   Hattie Perch 06/19/2010  . LAPAROSCOPIC  CHOLECYSTECTOMY  1/ 2012  . SHOULDER ARTHROSCOPY W/ ROTATOR CUFF REPAIR Right twice  . TONSILLECTOMY AND ADENOIDECTOMY  ~ 1951    Home Medications    Prior to Admission medications   Medication Sig Start Date End Date Taking? Authorizing Provider  amiodarone (PACERONE) 200 MG tablet Take 200 mg by mouth Mon-Fri and 400 mg on Sat and Sun. Please start taking once completed therapy with Levaquin on October 25th, 2017. Patient not taking: Reported on 11/24/2015 11/29/15   Dorothea Ogle, MD  apixaban (ELIQUIS) 5 MG TABS tablet Take 1 tablet (5 mg total) by mouth 2 (two) times daily. Resume 09/29/15 09/28/15   Marily Lente, NP  aspirin 81 MG EC tablet Take 1 tablet (81 mg total) by mouth daily. Patient taking differently: Take 81 mg by mouth every morning.  05/28/15   Ok Anis, NP  benzonatate (TESSALON) 200 MG capsule TAKE 1 CAPSULE BY MOUTH 3 TIMES DAILY AS NEEDED FOR COUGH Patient taking differently: TAKE 1 CAPSULE (200 mg) BY MOUTH 3 TIMES DAILY AS NEEDED FOR COUGH 10/31/14   Joaquim Nam, MD  diazepam (VALIUM) 5 MG tablet TAKE 1 TABLET BY MOUTH EVERY 6 HOURS AS NEEDED Patient taking differently: TAKE 1 TABLET BY MOUTH EVERY 6 HOURS  AS NEEDED FOR ANXIETY 10/04/15   Joaquim Nam, MD  donepezil (ARICEPT) 10 MG tablet Take 1 tablet (10 mg total) by mouth at bedtime. 11/01/15   Joaquim Nam, MD  fexofenadine (ALLEGRA) 180 MG tablet Take 180 mg by mouth daily as needed for allergies or rhinitis.    Historical Provider, MD  guaiFENesin-dextromethorphan (ROBITUSSIN DM) 100-10 MG/5ML syrup Take 5 mLs by mouth every 4 (four) hours as needed for cough. 11/21/15   Dorothea Ogle, MD  HYDROcodone-acetaminophen (NORCO/VICODIN) 5-325 MG tablet TAKE 1 TABLET BY MOUTH EVERY 6 HOURS AS NEEDED FOR PAIN Patient taking differently: Take 1 tablet by mouth every 6 (six) hours as needed for moderate pain.  10/10/15   Joaquim Nam, MD  Hydrocortisone (GERHARDT'S BUTT CREAM) CREA Apply 1 application  topically every 6 (six) hours as needed for irritation.    Historical Provider, MD  levalbuterol (XOPENEX) 1.25 MG/0.5ML nebulizer solution Take 1.25 mg by nebulization every 6 (six) hours as needed for wheezing or shortness of breath. 11/21/15   Dorothea Ogle, MD  levofloxacin (LEVAQUIN) 750 MG tablet Take 1 tablet (750 mg total) by mouth daily. 11/22/15   Dorothea Ogle, MD  loperamide (IMODIUM) 2 MG capsule Take 1 capsule (2 mg total) by mouth as needed for diarrhea or loose stools. 11/21/15   Dorothea Ogle, MD  metaxalone (SKELAXIN) 800 MG tablet TAKE 1 TABLET BY MOUTH 3 TIMES A DAY AS NEEDED FOR PAIN. Patient taking differently: Take 800 mg by mouth 3 (three) times daily as needed for muscle spasms.  10/10/15   Joaquim Nam, MD  Multiple Vitamins-Minerals (MULTIVITAMIN ADULTS 50+) TABS Take 1 tablet by mouth daily.    Historical Provider, MD  nitroGLYCERIN (NITROSTAT) 0.4 MG SL tablet PLACE 1 TABLET (0.4 MG TOTAL) UNDER THE TONGUE EVERY 5 (FIVE) MINUTES AS NEEDED. FOR CHEST PAIN. 11/20/15   Joaquim Nam, MD  oxymetazoline (AFRIN) 0.05 % nasal spray Place 1 spray into both nostrils 2 (two) times daily as needed for congestion.    Historical Provider, MD  promethazine (PHENERGAN) 25 MG tablet Take 0.5-1 tablets (12.5-25 mg total) by mouth every 8 (eight) hours as needed for nausea or vomiting. 11/14/15   Joaquim Nam, MD  ranitidine (ZANTAC) 150 MG capsule Take 150 mg by mouth daily as needed for heartburn.     Historical Provider, MD  ranolazine (RANEXA) 500 MG 12 hr tablet Take 1 tablet (500 mg total) by mouth 2 (two) times daily. 09/28/15   Amber Caryl Bis, NP  traMADol (ULTRAM) 50 MG tablet TAKE 1 TO 2 TABLETS BY MOUTH EVERY 8 HOURS AS NEEDED Patient taking differently: TAKE 1 TO 2 TABLETS BY MOUTH EVERY 8 HOURS AS NEEDED FOR PAIN. 10/24/15   Joaquim Nam, MD  zolpidem (AMBIEN CR) 12.5 MG CR tablet TAKE 1 TABLET BY MOUTH AT BEDTIME Patient taking differently: TAKE 1 TABLET BY MOUTH AT  BEDTIME AS NEEDED FOR SLEEP 07/19/15   Joaquim Nam, MD   Family History Family History  Problem Relation Age of Onset  . Diabetes Father   . Tracheal cancer Father 37    smoker  . Stroke Mother   . Cancer Sister     left eye  . CAD Neg Hx   . Colon cancer Neg Hx   . Prostate cancer Neg Hx    Social History Social History  Substance Use Topics  . Smoking status: Former Smoker    Packs/day: 0.50  Years: 50.00    Types: Cigarettes, Cigars    Quit date: 09/27/2015  . Smokeless tobacco: Never Used  . Alcohol use No   Allergies   Ace inhibitors; Codeine; Doxycycline; Penicillins; Lisinopril; and Statins  Review of Systems Review of Systems  Respiratory: Positive for shortness of breath. Negative for chest tightness.   Cardiovascular: Negative for chest pain.  Neurological: Positive for seizures.       Negative for bowel/bladder incontinence.   All other systems reviewed and are negative.  Physical Exam Updated Vital Signs BP 102/61   Pulse 61   Resp 19   SpO2 96%   Physical Exam  Constitutional: He is oriented to person, place, and time. He appears well-developed and well-nourished.  HENT:  Head: Normocephalic and atraumatic.  Eyes: EOM are normal. Pupils are equal, round, and reactive to light.  Neck: Normal range of motion. Neck supple. No JVD present.  Cardiovascular: Normal rate and regular rhythm.   Murmur heard.  Systolic murmur is present with a grade of 2/6  2/6 systolic ejection murmur heard over the left sternal border.   Pulmonary/Chest: Effort normal and breath sounds normal. He has no wheezes. He has no rales. He exhibits no tenderness.  Abdominal: Soft. Bowel sounds are normal. He exhibits no distension and no mass. There is no tenderness.  Musculoskeletal: Normal range of motion. He exhibits no edema.  Lymphadenopathy:    He has no cervical adenopathy.  Neurological: He is alert and oriented to person, place, and time. No cranial nerve deficit.  He exhibits normal muscle tone. Coordination normal.  Skin: Skin is warm and dry. No rash noted.  Psychiatric: He has a normal mood and affect. His behavior is normal. Judgment and thought content normal.  Vitals reviewed.  ED Treatments / Results  DIAGNOSTIC STUDIES: Oxygen Saturation is 93% on [id number], adequate by my interpretation.   COORDINATION OF CARE: 2:51 AM-Discussed next steps with pt. Pt verbalized understanding and is agreeable with the plan.   Labs (all labs ordered are listed, but only abnormal results are displayed) Labs Reviewed  COMPREHENSIVE METABOLIC PANEL - Abnormal; Notable for the following:       Result Value   Glucose, Bld 121 (*)    Calcium 7.4 (*)    Total Protein 5.0 (*)    Albumin 2.0 (*)    AST 109 (*)    ALT 80 (*)    All other components within normal limits  TROPONIN I - Abnormal; Notable for the following:    Troponin I 0.05 (*)    All other components within normal limits  CBC WITH DIFFERENTIAL/PLATELET - Abnormal; Notable for the following:    RBC 3.52 (*)    Hemoglobin 9.7 (*)    HCT 30.8 (*)    Platelets 411 (*)    Neutro Abs 8.0 (*)    All other components within normal limits  I-STAT CG4 LACTIC ACID, ED    EKG  EKG Interpretation  Date/Time:  Tuesday November 28 2015 02:11:18 EDT Ventricular Rate:  63 PR Interval:    QRS Duration: 214 QT Interval:  540 QTC Calculation: 553 R Axis:   139 Text Interpretation:  Sinus rhythm Right bundle branch block Lateral infarct, age indeterminate When compared with ECG of 11/17/2015, No significant change was found Confirmed by [facility]  MD, Emelee Rodocker ([id number]) on 11/28/2015 2:18:23 AM      Radiology Dg Chest 2 View  Result Date: 11/28/2015 CLINICAL DATA:  Shortness of breath EXAM: CHEST  2 VIEW COMPARISON:  11/24/2015, 03/06/2011, CT 11/17/2015 FINDINGS: Left-sided single lead ICD. Lead placement similar compared to previous. There is cardiomegaly. Interval increase in interstitial opacities  asymmetric in the left thorax. Slight increased interstitial opacities within the right lung base. Atherosclerosis of the aorta. No pneumothorax. IMPRESSION: 1. Increased interstitial and alveolar opacities within the left greater then right chest since the previous exam. 2. Stable degree of cardiomegaly. Electronically Signed   By: Jasmine PangKim  Fujinaga M.D.   On: 11/28/2015 04:38   Ct Head Wo Contrast  Result Date: 11/28/2015 CLINICAL DATA:  Unresponsive, seizure like activity EXAM: CT HEAD WITHOUT CONTRAST TECHNIQUE: Contiguous axial images were obtained from the base of the skull through the vertex without intravenous contrast. COMPARISON:  10/13/2015 FINDINGS: Brain: No acute territorial infarction or hemorrhage. No focal mass, mass effect or midline shift. Mild to moderate cortical atrophy. Moderate periventricular and subcortical white matter small vessel ischemic changes. Ventricles are similar in size and configuration. Vascular: No hyperdense vessels. Calcifications within the carotid arteries at the skullbase. Skull: No fracture.  Mastoid air cells clear. Sinuses/Orbits: Minimal mucosal thickening in the paranasal sinuses. No acute orbital abnormality. Other: None IMPRESSION: 1. No CT evidence for acute intracranial abnormality. 2. Moderate atrophy. Moderate periventricular and subcortical white matter small vessel ischemic disease. Electronically Signed   By: Jasmine PangKim  Fujinaga M.D.   On: 11/28/2015 04:35    Procedures Procedures  Medications Ordered in ED Medications  vancomycin (VANCOCIN) IVPB 1000 mg/200 mL premix (not administered)  ceFEPIme (MAXIPIME) 2 g in dextrose 5 % 50 mL IVPB (2 g Intravenous New Bag/Given 11/28/15 0514)    Initial Impression / Assessment and Plan / ED Course  I have reviewed the triage vital signs and the nursing notes.  Pertinent labs & imaging results that were available during my care of the patient were reviewed by me and considered in my medical decision making  (see chart for details).  Clinical Course   Hypoxia in patient with the recent admission for pneumonia. Old records are reviewed confirming he was discharged on October 17 after admission for COPD and pneumonia and hypoxia. He was given a prescription for levofloxacin which he has completed his course as of today. He added an episode where he had difficulty breathing and EMS came and increased his oxygen to 4-1/2 L a minute she reviewed some improvement. He also had a syncopal episode at home. Chest x-ray here shows worsening left-sided opacities. Troponin is mildly elevated which is presumably secondary to demand ischemia. He was started on antibiotics for healthcare associated pneumonia. Case is discussed with Dr. Toniann FailKakrakandy of [facility] who agrees to admit the patient.  Final Clinical Impressions(s) / ED Diagnoses   Final diagnoses:  HCAP (healthcare-associated pneumonia)  Hypoxia  Syncope, unspecified syncope type  Normochromic normocytic anemia  Elevated troponin   New Prescriptions New Prescriptions   No medications on file   I personally performed the services described in this documentation, which was scribed in my presence. The recorded information has been reviewed and is accurate.      Dione Boozeavid Teneshia Hedeen, MD 11/28/15 [phone]

## 2015-11-28 NOTE — Consult Note (Signed)
CONSULTATION NOTE  Reason for Consult: Dyspnea  Requesting Physician: Dr. Hal Hope  Cardiologist: Dr. Lovena Le  HPI: This is a 70 y.o. male with a past medical history significant for ischemic cardiomyopathy with EF around 25%, class III chronic systolic congestive heart failure, chronic atrial fibrillation, ventricular tachycardia status post AICD. Recently he was hospitalized with VT storm. He had not been on anticoagulation by his choice but agreed to be put on Eliquis and converted back to sinus rhythm. He was placed on amiodarone for his VT. He was counseled about the watchman procedure but did not report interest in this. He was admitted in early October for age And readmitted for recurrent respiratory symptoms. He is now found to have acute on chronic respiratory failure with hypoxia and progressive worsening of left upper lobe infiltrate. Due to low blood pressure, he has been off of most of his heart failure medications. He reportedly has had recent weight loss. Cardiology is asked to consult regarding heart failure management.  Recently requiring more supplemental oxygen. Loss of appetite and abdominal fullness. He was seen by pulmonary and stated on steroids. Concern for inflammatory lung disease and amiodarone lung toxicity -> now discontinue.   PMHx:  Past Medical History:  Diagnosis Date  . Acute lower GI bleeding 12/11/2011   "first time" (12/11/2011)  . Acute myocardial infarction, unspecified site, episode of care unspecified 1995   Pt living in [place], no stent, ?PTCA  . AICD (automatic cardioverter/defibrillator) present   . Allergic rhinitis, cause unspecified   . Anxiety   . Arthritis    "all over" (11/07/2015)  . Atrial fibrillation ([place])    a. 05/2015 - converted to sinus in setting of ICD shocks; placed on eliquis 5 bid.  [name] CAD (coronary artery disease), autologous vein bypass graft   . Cervical herniated disc    told not to lift >10 lbs  . Chronic systolic  CHF (congestive heart failure), NYHA class 2 (HCC)    Reports EF of 25%.   [name] COPD (chronic obstructive pulmonary disease) ([place]) 11/2012   by xray  . Diverticulosis    by CT scan  . HCAP (healthcare-associated pneumonia) 11/06/2015  . Hypertension   . Insomnia   . Paroxysmal ventricular tachycardia ([place])   . Perennial allergic rhinitis    only to dust mites  . Pneumonia 2000s   "walking pneumonia"  . VT (ventricular tachycardia) ([place])    a. 05/2015 - VT storm with multiple ICD shocks-->Amio 400 BID.   Past Surgical History:  Procedure Laterality Date  . CARDIAC CATHETERIZATION  2004   LAD 30%, D1 30%, CFX-AV groove 70-80%, OM1 30%, EF 20-25%  . CARDIAC CATHETERIZATION N/A 09/28/2015   Procedure: Left Heart Cath and Coronary Angiography;  Surgeon: Peter M Martinique, MD;  Location: [facility];  Service: Cardiovascular;  Laterality: N/A;  . CARDIAC DEFIBRILLATOR PLACEMENT  2004  . CATARACT EXTRACTION W/ INTRAOCULAR LENS IMPLANT Right 01/2012  . COLONOSCOPY  01/08/2012   Procedure: COLONOSCOPY;  Surgeon: Gatha Mayer, MD;  Location: [facility];  Service: Endoscopy;  Laterality: N/A;  . CORONARY ANGIOPLASTY  1995   Pt thinks he got a balloon, living in [place], [place] N/A 02/07/2012   Procedure: IMPLANTABLE CARDIOVERTER DEFIBRILLATOR GENERATOR CHANGE;  Surgeon: Evans Lance, MD; Medtronic Evera XT VR single-chamber serial number [id number], Laterality: Left  . INSERT / REPLACE / REMOVE PACEMAKER  2004   Medtronic ICD  . KNEE  ARTHROSCOPY Left 05/2003   Archie Endo 06/19/2010  . LAPAROSCOPIC CHOLECYSTECTOMY  1/ 2012  . SHOULDER ARTHROSCOPY W/ ROTATOR CUFF REPAIR Right twice  . TONSILLECTOMY AND ADENOIDECTOMY  ~ 1951    FAMHx: Family History  Problem Relation Age of Onset  . Diabetes Father   . Tracheal cancer Father 88    smoker  . Stroke Mother   . Cancer Sister     left eye  . CAD Neg Hx   . Colon cancer Neg Hx   .  Prostate cancer Neg Hx     SOCHx:  reports that he quit smoking about 2 months ago. His smoking use included Cigarettes and Cigars. He has a 25.00 pack-year smoking history. He has never used smokeless tobacco. He reports that he does not drink alcohol or use drugs.  ALLERGIES: Allergies  Allergen Reactions  . Ace Inhibitors Other (See Comments)    muscle pain. Tolerates ARBs.   . Codeine Other (See Comments)    "head wants to explode."  . Doxycycline Diarrhea and Nausea And Vomiting  . Penicillins Swelling    "started at point of injection; w/in 3 min my upper arm was swollen 3 times normal" Has patient had a PCN reaction causing immediate rash, facial/tongue/throat swelling, SOB or lightheadedness with hypotension: Yes Has patient had a PCN reaction causing severe rash involving mucus membranes or skin necrosis: No Has patient had a PCN reaction that required hospitalization No Has patient had a PCN reaction occurring within the last 10 years: No If all of the above answers are "NO", then may proceed wi  . Lisinopril     Muscle Pain  . Statins Other (See Comments)    Myalgias per patient    ROS: Pertinent items noted in HPI and remainder of comprehensive ROS otherwise negative.  HOME MEDICATIONS: Current Facility-Administered Medications on File Prior to Encounter  Medication Dose Route Frequency Provider Last Rate Last Dose  . sodium chloride 0.9 % injection 3 mL  3 mL Intravenous Q12H Evans Lance, MD      . sodium chloride 0.9 % injection 3 mL  3 mL Intravenous PRN Evans Lance, MD       Current Outpatient Prescriptions on File Prior to Encounter  Medication Sig Dispense Refill  . apixaban (ELIQUIS) 5 MG TABS tablet Take 1 tablet (5 mg total) by mouth 2 (two) times daily. Resume 09/29/15 60 tablet 6  . aspirin 81 MG EC tablet Take 1 tablet (81 mg total) by mouth daily.    . benzonatate (TESSALON) 200 MG capsule TAKE 1 CAPSULE BY MOUTH 3 TIMES DAILY AS NEEDED FOR COUGH  (Patient taking differently: TAKE 1 CAPSULE (200 mg) BY MOUTH 3 TIMES DAILY AS NEEDED FOR COUGH) 30 capsule 1  . diazepam (VALIUM) 5 MG tablet TAKE 1 TABLET BY MOUTH EVERY 6 HOURS AS NEEDED (Patient taking differently: TAKE 1 TABLET BY MOUTH EVERY 6 HOURS AS NEEDED FOR ANXIETY) 45 tablet 0  . donepezil (ARICEPT) 10 MG tablet Take 1 tablet (10 mg total) by mouth at bedtime. 90 tablet 1  . guaiFENesin-dextromethorphan (ROBITUSSIN DM) 100-10 MG/5ML syrup Take 5 mLs by mouth every 4 (four) hours as needed for cough. 118 mL 0  . HYDROcodone-acetaminophen (NORCO/VICODIN) 5-325 MG tablet TAKE 1 TABLET BY MOUTH EVERY 6 HOURS AS NEEDED FOR PAIN (Patient taking differently: Take 1 tablet by mouth every 6 (six) hours as needed for moderate pain. ) 30 tablet 0  . levalbuterol (XOPENEX) 1.25 MG/0.5ML nebulizer solution  Take 1.25 mg by nebulization every 6 (six) hours as needed for wheezing or shortness of breath. 1 each 12  . levofloxacin (LEVAQUIN) 750 MG tablet Take 1 tablet (750 mg total) by mouth daily. 7 tablet 0  . loperamide (IMODIUM) 2 MG capsule Take 1 capsule (2 mg total) by mouth as needed for diarrhea or loose stools. 30 capsule 0  . metaxalone (SKELAXIN) 800 MG tablet TAKE 1 TABLET BY MOUTH 3 TIMES A DAY AS NEEDED FOR PAIN. (Patient taking differently: Take 800 mg by mouth 3 (three) times daily as needed for muscle spasms. ) 90 tablet 1  . Multiple Vitamins-Minerals (MULTIVITAMIN ADULTS 50+) TABS Take 1 tablet by mouth daily.    . nitroGLYCERIN (NITROSTAT) 0.4 MG SL tablet PLACE 1 TABLET (0.4 MG TOTAL) UNDER THE TONGUE EVERY 5 (FIVE) MINUTES AS NEEDED. FOR CHEST PAIN. 25 tablet 1  . promethazine (PHENERGAN) 25 MG tablet Take 0.5-1 tablets (12.5-25 mg total) by mouth every 8 (eight) hours as needed for nausea or vomiting. 30 tablet 0  . ranitidine (ZANTAC) 150 MG capsule Take 150 mg by mouth daily as needed for heartburn.     . ranolazine (RANEXA) 500 MG 12 hr tablet Take 1 tablet (500 mg total) by mouth  2 (two) times daily. 60 tablet 1  . traMADol (ULTRAM) 50 MG tablet TAKE 1 TO 2 TABLETS BY MOUTH EVERY 8 HOURS AS NEEDED (Patient taking differently: TAKE 1 TO 2 TABLETS BY MOUTH EVERY 8 HOURS AS NEEDED FOR PAIN.) 100 tablet 0  . zolpidem (AMBIEN CR) 12.5 MG CR tablet TAKE 1 TABLET BY MOUTH AT BEDTIME (Patient taking differently: TAKE 1 TABLET BY MOUTH AT BEDTIME AS NEEDED FOR SLEEP) 30 tablet 5  . [START ON 11/29/2015] amiodarone (PACERONE) 200 MG tablet Take 200 mg by mouth Mon-Fri and 400 mg on Sat and Sun. Please start taking once completed therapy with Levaquin on October 25th, 2017. (Patient not taking: Reported on 11/28/2015)  0    HOSPITAL MEDICATIONS: Prior to Admission:  Prescriptions Prior to Admission  Medication Sig Dispense Refill Last Dose  . apixaban (ELIQUIS) 5 MG TABS tablet Take 1 tablet (5 mg total) by mouth 2 (two) times daily. Resume 09/29/15 60 tablet 6 11/27/2015 at 2200  . aspirin 81 MG EC tablet Take 1 tablet (81 mg total) by mouth daily.   11/27/2015 at Unknown time  . benzonatate (TESSALON) 200 MG capsule TAKE 1 CAPSULE BY MOUTH 3 TIMES DAILY AS NEEDED FOR COUGH (Patient taking differently: TAKE 1 CAPSULE (200 mg) BY MOUTH 3 TIMES DAILY AS NEEDED FOR COUGH) 30 capsule 1 unknown  . diazepam (VALIUM) 5 MG tablet TAKE 1 TABLET BY MOUTH EVERY 6 HOURS AS NEEDED (Patient taking differently: TAKE 1 TABLET BY MOUTH EVERY 6 HOURS AS NEEDED FOR ANXIETY) 45 tablet 0 unknown  . donepezil (ARICEPT) 10 MG tablet Take 1 tablet (10 mg total) by mouth at bedtime. 90 tablet 1 11/27/2015 at Unknown time  . fexofenadine-pseudoephedrine (ALLEGRA-D 24) 180-240 MG 24 hr tablet Take 1 tablet by mouth daily.   Past Week at Unknown time  . guaiFENesin (MUCINEX) 600 MG 12 hr tablet Take by mouth 2 (two) times daily.   11/27/2015 at Unknown time  . guaiFENesin-dextromethorphan (ROBITUSSIN DM) 100-10 MG/5ML syrup Take 5 mLs by mouth every 4 (four) hours as needed for cough. 118 mL 0 unknown  .  HYDROcodone-acetaminophen (NORCO/VICODIN) 5-325 MG tablet TAKE 1 TABLET BY MOUTH EVERY 6 HOURS AS NEEDED FOR PAIN (Patient taking  differently: Take 1 tablet by mouth every 6 (six) hours as needed for moderate pain. ) 30 tablet 0 Past Week at Unknown time  . levalbuterol (XOPENEX) 1.25 MG/0.5ML nebulizer solution Take 1.25 mg by nebulization every 6 (six) hours as needed for wheezing or shortness of breath. 1 each 12 11/27/2015 at Unknown time  . levofloxacin (LEVAQUIN) 750 MG tablet Take 1 tablet (750 mg total) by mouth daily. 7 tablet 0 11/27/2015 at Unknown time  . loperamide (IMODIUM) 2 MG capsule Take 1 capsule (2 mg total) by mouth as needed for diarrhea or loose stools. 30 capsule 0 11/27/2015 at Unknown time  . metaxalone (SKELAXIN) 800 MG tablet TAKE 1 TABLET BY MOUTH 3 TIMES A DAY AS NEEDED FOR PAIN. (Patient taking differently: Take 800 mg by mouth 3 (three) times daily as needed for muscle spasms. ) 90 tablet 1 Past Week at Unknown time  . Multiple Vitamins-Minerals (MULTIVITAMIN ADULTS 50+) TABS Take 1 tablet by mouth daily.   11/27/2015 at Unknown time  . nitroGLYCERIN (NITROSTAT) 0.4 MG SL tablet PLACE 1 TABLET (0.4 MG TOTAL) UNDER THE TONGUE EVERY 5 (FIVE) MINUTES AS NEEDED. FOR CHEST PAIN. 25 tablet 1 unknown  . promethazine (PHENERGAN) 25 MG tablet Take 0.5-1 tablets (12.5-25 mg total) by mouth every 8 (eight) hours as needed for nausea or vomiting. 30 tablet 0 unknown  . ranitidine (ZANTAC) 150 MG capsule Take 150 mg by mouth daily as needed for heartburn.    unknown  . ranolazine (RANEXA) 500 MG 12 hr tablet Take 1 tablet (500 mg total) by mouth 2 (two) times daily. 60 tablet 1 11/27/2015 at Unknown time  . traMADol (ULTRAM) 50 MG tablet TAKE 1 TO 2 TABLETS BY MOUTH EVERY 8 HOURS AS NEEDED (Patient taking differently: TAKE 1 TO 2 TABLETS BY MOUTH EVERY 8 HOURS AS NEEDED FOR PAIN.) 100 tablet 0 unknown  . zolpidem (AMBIEN CR) 12.5 MG CR tablet TAKE 1 TABLET BY MOUTH AT BEDTIME (Patient  taking differently: TAKE 1 TABLET BY MOUTH AT BEDTIME AS NEEDED FOR SLEEP) 30 tablet 5 unknown  . [START ON 11/29/2015] amiodarone (PACERONE) 200 MG tablet Take 200 mg by mouth Mon-Fri and 400 mg on Sat and Sun. Please start taking once completed therapy with Levaquin on October 25th, 2017. (Patient not taking: Reported on 11/28/2015)  0 Not Taking at Unknown time    VITALS: Blood pressure 126/66, pulse (!) 57, temperature 97 F (36.1 C), temperature source Oral, resp. rate 18, height '5\' 3"'  (1.6 m), weight 138 lb 8 oz (62.8 kg), SpO2 95 %.  PHYSICAL EXAM: General: Well developed, well nourished,male  in no acute distress Head: Eyes PERRLA, No xanthomas. Normocephalic and atraumatic, oropharynx without edema or exudate.  Lungs: Resp regular and unlabored, faint rales at base Heart: RRR no s3, s4, or murmurs.. Neck: No carotid bruits. No lymphadenopathy. No JVD. Abdomen: Bowel sounds present, abdomen soft and non-tender without masses or hernias noted. Msk:  No spine or cva tenderness. No weakness, no joint deformities or effusions. Extremities: No clubbing, cyanosis or edema. DP/PT/Radials 2+ and equal bilaterally. Neuro: Alert and oriented X 3. No focal deficits noted. Psych:  Good affect, responds appropriately Skin: No rashes or lesions noted.   LABS: Results for orders placed or performed during the hospital encounter of 11/28/15 (from the past 48 hour(s))  Comprehensive metabolic panel     Status: Abnormal   Collection Time: 11/28/15  2:58 AM  Result Value Ref Range   Sodium 140 135 -  145 mmol/L   Potassium 4.1 3.5 - 5.1 mmol/L   Chloride 109 101 - 111 mmol/L   CO2 25 22 - 32 mmol/L   Glucose, Bld 121 (H) 65 - 99 mg/dL   BUN 17 6 - 20 mg/dL   Creatinine, Ser 0.98 0.61 - 1.24 mg/dL   Calcium 7.4 (L) 8.9 - 10.3 mg/dL   Total Protein 5.0 (L) 6.5 - 8.1 g/dL   Albumin 2.0 (L) 3.5 - 5.0 g/dL   AST 109 (H) 15 - 41 U/L   ALT 80 (H) 17 - 63 U/L   Alkaline Phosphatase 76 38 - 126  U/L   Total Bilirubin 1.1 0.3 - 1.2 mg/dL   GFR calc non Af Amer >60 >60 mL/min   GFR calc Af Amer >60 >60 mL/min    Comment: (NOTE) The eGFR has been calculated using the CKD EPI equation. This calculation has not been validated in all clinical situations. eGFR's persistently <60 mL/min signify possible Chronic Kidney Disease.    Anion gap 6 5 - 15  Troponin I     Status: Abnormal   Collection Time: 11/28/15  2:58 AM  Result Value Ref Range   Troponin I 0.05 (HH) <0.03 ng/mL    Comment: CRITICAL RESULT CALLED TO, READ BACK BY AND VERIFIED WITH: KNUCKLES,C RN 11/28/2015 [id number] JORDANS   CBC with Differential     Status: Abnormal   Collection Time: 11/28/15  2:58 AM  Result Value Ref Range   WBC 10.0 4.0 - 10.5 K/uL   RBC 3.52 (L) 4.22 - 5.81 MIL/uL   Hemoglobin 9.7 (L) 13.0 - 17.0 g/dL   HCT 30.8 (L) 39.0 - 52.0 %   MCV 87.5 78.0 - 100.0 fL   MCH 27.6 26.0 - 34.0 pg   MCHC 31.5 30.0 - 36.0 g/dL   RDW 14.9 11.5 - 15.5 %   Platelets 411 (H) 150 - 400 K/uL   Neutrophils Relative % 80 %   Neutro Abs 8.0 (H) 1.7 - 7.7 K/uL   Lymphocytes Relative 8 %   Lymphs Abs 0.8 0.7 - 4.0 K/uL   Monocytes Relative 10 %   Monocytes Absolute 1.0 0.1 - 1.0 K/uL   Eosinophils Relative 2 %   Eosinophils Absolute 0.2 0.0 - 0.7 K/uL   Basophils Relative 0 %   Basophils Absolute 0.0 0.0 - 0.1 K/uL  I-Stat CG4 Lactic Acid, ED     Status: None   Collection Time: 11/28/15  3:01 AM  Result Value Ref Range   Lactic Acid, Venous 1.14 0.5 - 1.9 mmol/L  Brain natriuretic peptide     Status: Abnormal   Collection Time: 11/28/15  7:37 AM  Result Value Ref Range   B Natriuretic Peptide 494.0 (H) 0.0 - 100.0 pg/mL  Sedimentation rate     Status: Abnormal   Collection Time: 11/28/15  7:37 AM  Result Value Ref Range   Sed Rate 40 (H) 0 - 16 mm/hr  C-reactive protein     Status: Abnormal   Collection Time: 11/28/15  7:37 AM  Result Value Ref Range   CRP 10.9 (H) <1.0 mg/dL  Procalcitonin - Baseline      Status: None   Collection Time: 11/28/15  7:37 AM  Result Value Ref Range   Procalcitonin <0.10 ng/mL    Comment:        Interpretation: PCT (Procalcitonin) <= 0.5 ng/mL: Systemic infection (sepsis) is not likely. Local bacterial infection is possible. (NOTE)  ICU PCT Algorithm               Non ICU PCT Algorithm    ----------------------------     ------------------------------         PCT < 0.25 ng/mL                 PCT < 0.1 ng/mL     Stopping of antibiotics            Stopping of antibiotics       strongly encouraged.               strongly encouraged.    ----------------------------     ------------------------------       PCT level decrease by               PCT < 0.25 ng/mL       >= 80% from peak PCT       OR PCT 0.25 - 0.5 ng/mL          Stopping of antibiotics                                             encouraged.     Stopping of antibiotics           encouraged.    ----------------------------     ------------------------------       PCT level decrease by              PCT >= 0.25 ng/mL       < 80% from peak PCT        AND PCT >= 0.5 ng/mL            Continuin g antibiotics                                              encouraged.       Continuing antibiotics            encouraged.    ----------------------------     ------------------------------     PCT level increase compared          PCT > 0.5 ng/mL         with peak PCT AND          PCT >= 0.5 ng/mL             Escalation of antibiotics                                          strongly encouraged.      Escalation of antibiotics        strongly encouraged.   Magnesium     Status: None   Collection Time: 11/28/15  8:00 AM  Result Value Ref Range   Magnesium 1.9 1.7 - 2.4 mg/dL  Phosphorus     Status: None   Collection Time: 11/28/15  8:00 AM  Result Value Ref Range   Phosphorus 2.5 2.5 - 4.6 mg/dL  TSH     Status: Abnormal   Collection Time: 11/28/15  8:00 AM  Result Value Ref Range   TSH 0.035 (L)  0.350 - 4.500  uIU/mL    Comment: Performed by a 3rd Generation assay with a functional sensitivity of <=0.01 uIU/mL.  Blood gas, arterial     Status: Abnormal   Collection Time: 11/28/15  8:53 AM  Result Value Ref Range   FIO2 45.00    Delivery systems VENTURI MASK    pH, Arterial 7.433 7.350 - 7.450   pCO2 arterial 37.3 32.0 - 48.0 mmHg   pO2, Arterial 77.1 (L) 83.0 - 108.0 mmHg   Bicarbonate 24.5 20.0 - 28.0 mmol/L   Acid-Base Excess 0.7 0.0 - 2.0 mmol/L   O2 Saturation 95.2 %   Patient temperature 98.6    Collection site LEFT RADIAL    Drawn by [id number]    Sample type ARTERIAL DRAW    Allens test (pass/fail) PASS PASS  Troponin I (q 6hr x 3)     Status: Abnormal   Collection Time: 11/28/15 10:18 AM  Result Value Ref Range   Troponin I 0.04 (HH) <0.03 ng/mL    Comment: CRITICAL VALUE NOTED.  VALUE IS CONSISTENT WITH PREVIOUSLY REPORTED AND CALLED VALUE.  Vitamin B12     Status: None   Collection Time: 11/28/15 10:18 AM  Result Value Ref Range   Vitamin B-12 450 180 - 914 pg/mL    Comment: (NOTE) This assay is not validated for testing neonatal or myeloproliferative syndrome specimens for Vitamin B12 levels.   Folate     Status: None   Collection Time: 11/28/15 10:18 AM  Result Value Ref Range   Folate 34.9 >5.9 ng/mL  Iron and TIBC     Status: Abnormal   Collection Time: 11/28/15 10:18 AM  Result Value Ref Range   Iron 26 (L) 45 - 182 ug/dL   TIBC 206 (L) 250 - 450 ug/dL   Saturation Ratios 13 (L) 17.9 - 39.5 %   UIBC 180 ug/dL  Ferritin     Status: None   Collection Time: 11/28/15 10:18 AM  Result Value Ref Range   Ferritin 312 24 - 336 ng/mL  Reticulocytes     Status: Abnormal   Collection Time: 11/28/15 10:18 AM  Result Value Ref Range   Retic Ct Pct 2.5 0.4 - 3.1 %   RBC. 3.53 (L) 4.22 - 5.81 MIL/uL   Retic Count, Manual 88.3 19.0 - 186.0 K/uL  Urinalysis, Routine w reflex microscopic (not at [facility])     Status: Abnormal   Collection Time: 11/28/15  2:27 PM    Result Value Ref Range   Color, Urine ORANGE (A) YELLOW    Comment: BIOCHEMICALS MAY BE AFFECTED BY COLOR   APPearance CLOUDY (A) CLEAR   Specific Gravity, Urine 1.030 1.005 - 1.030   pH 5.5 5.0 - 8.0   Glucose, UA NEGATIVE NEGATIVE mg/dL   Hgb urine dipstick NEGATIVE NEGATIVE   Bilirubin Urine SMALL (A) NEGATIVE   Ketones, ur NEGATIVE NEGATIVE mg/dL   Protein, ur 30 (A) NEGATIVE mg/dL   Nitrite POSITIVE (A) NEGATIVE   Leukocytes, UA SMALL (A) NEGATIVE  Urine microscopic-add on     Status: Abnormal   Collection Time: 11/28/15  2:27 PM  Result Value Ref Range   Squamous Epithelial / LPF 0-5 (A) NONE SEEN   WBC, UA 0-5 0 - 5 WBC/hpf   RBC / HPF NONE SEEN 0 - 5 RBC/hpf   Bacteria, UA MANY (A) NONE SEEN   Casts HYALINE CASTS (A) NEGATIVE    IMAGING: Dg Chest 2 View  Result Date: 11/28/2015 CLINICAL DATA:  Shortness of breath  EXAM: CHEST  2 VIEW COMPARISON:  11/24/2015, 03/06/2011, CT 11/17/2015 FINDINGS: Left-sided single lead ICD. Lead placement similar compared to previous. There is cardiomegaly. Interval increase in interstitial opacities asymmetric in the left thorax. Slight increased interstitial opacities within the right lung base. Atherosclerosis of the aorta. No pneumothorax. IMPRESSION: 1. Increased interstitial and alveolar opacities within the left greater then right chest since the previous exam. 2. Stable degree of cardiomegaly. Electronically Signed   By: Donavan Foil M.D.   On: 11/28/2015 04:38   Ct Head Wo Contrast  Result Date: 11/28/2015 CLINICAL DATA:  Unresponsive, seizure like activity EXAM: CT HEAD WITHOUT CONTRAST TECHNIQUE: Contiguous axial images were obtained from the base of the skull through the vertex without intravenous contrast. COMPARISON:  10/13/2015 FINDINGS: Brain: No acute territorial infarction or hemorrhage. No focal mass, mass effect or midline shift. Mild to moderate cortical atrophy. Moderate periventricular and subcortical white matter small  vessel ischemic changes. Ventricles are similar in size and configuration. Vascular: No hyperdense vessels. Calcifications within the carotid arteries at the skullbase. Skull: No fracture.  Mastoid air cells clear. Sinuses/Orbits: Minimal mucosal thickening in the paranasal sinuses. No acute orbital abnormality. Other: None IMPRESSION: 1. No CT evidence for acute intracranial abnormality. 2. Moderate atrophy. Moderate periventricular and subcortical white matter small vessel ischemic disease. Electronically Signed   By: Donavan Foil M.D.   On: 11/28/2015 04:35    Cath 09/28/15 Left Heart Cath and Coronary Angiography  Conclusion     Prox RCA to Mid RCA lesion, 100 %stenosed.  Ost LM to LM lesion, 30 %stenosed.  Mid Cx lesion, 70 %stenosed.  Ost 3rd Mrg to 3rd Mrg lesion, 75 %stenosed.  Prox LAD lesion, 35 %stenosed.  There is severe left ventricular systolic dysfunction.  LV end diastolic pressure is severely elevated.  The left ventricular ejection fraction is less than 25% by visual estimate.  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   1. 2 vessel obstructive CAD. There is chronic total occlusion of the proximal RCA. There is moderate disease in the distal LCx and third OM. This is unchanged from 2004. There is new aneurysmal dilation of the distal left main. 2. Severe LV dysfunction  3. Marked elevation of the LVEDP.  Plan: continue medical therapy. He may benefit from additional diuresis.      HOSPITAL DIAGNOSES: Principal Problem:   Acute on chronic respiratory failure with hypoxia (HCC) Active Problems:   Hyperlipidemia   COPD (chronic obstructive pulmonary disease) (HCC)   Chronic systolic CHF (congestive heart failure) (HCC)   Ischemic cardiomyopathy   History of ventricular tachycardia   Chronic atrial fibrillation (HCC)   Memory loss   HCAP (healthcare-associated pneumonia)   AICD (automatic cardioverter/defibrillator) present   Physical  deconditioning   Transaminitis   Anemia   Elevated troponin   Hypoxia  ASSESSMENT AND PLAN 1. Acute on chronic systolic CHF/ICM - symptoms is also concerning for R sided heart failure given loss of appetite, weight loss and abdominal fullness supported by minimally elevated LFTs. Pending echo. Recent cath report as above.   2. VT s/p ICD - Discontinued amiodarone for possible toxicity. Continue to follow closely.   Mathilda Maguire, [place]

## 2015-11-28 NOTE — Care Management Note (Signed)
Case Management Note  Patient Details  Name: UBALDO SIVA MRN: [id number] Date of Birth: 1946/01/22  Subjective/Objective:      Admitted with Pneumonia              Action/Plan: Patient lives at home with spouse; PCP is Dr Para March; has private insurance with [employer] / [employer]BS with prescription drug coverage; [facility] choice [facility]; patient reports no problem getting his medication. He has home oxygen / nebulizer machine through [facility]. Patient would benefit greatly from a Disease Management Program for COPD / Pneumonia; HHC choice offered, pt / spouse chose [facility]; Jal with [place] called for arrangements. Attending MD at discharge please enter the face to face for [facility] services.  Expected Discharge Date:    possibly 12/02/2015              Expected Discharge Plan:  Home w Home Health Services  Discharge planning Services  CM Consult   Choice offered to:  Patient, Spouse  HH Arranged:  RN, Disease Management, PT HH Agency:  [facility]  Status of Service:  In process, will continue to follow  Reola Mosher [phone] 11/28/2015, 11:02 AM

## 2015-11-28 NOTE — ED Notes (Signed)
Verified with Dr. Preston Fleeting before administering antibiotics. No blood cultures are to be ordered.l

## 2015-11-28 NOTE — Progress Notes (Signed)
Patient is alert and oriented with wife at bedside repositioned to breath better. Weaned to Nasal cannula 6l desated to mid 80s. Back of 40% and sats 91-93%. MDPlan to have ECHO, ABG, and labs, Lasix and Pt/OT

## 2015-11-28 NOTE — H&P (Signed)
History and Physical    AADIN GAUT JJK:[id number] DOB: 20-May-1945 DOA: 11/28/2015   PCP: Elsie Stain, MD   Patient coming from/Resides with: Private residence/lives with wife  Admission status: Inpatient/telemetry -medically necessary to stay a minimum 2 midnights to rule out impending and/or unexpected changes in physiologic status that may differ from initial evaluation performed in the ER and/or at time of admission. Presents with recurrence of acute on chronic hypoxemia with abnormal chest x-ray concerning for possible reemergence of HCAP although idiopathic interstitial lung disease not excluded, and less likely systolic heart failure exacerbation. Patient will require inpatient evaluation by both cardiology and pulmonology specialties, IV antibiotics and potentially IV steroids. He is also deconditioned and will require inpatient PT/OT evaluations. He also has increased oxygen needs and is now requiring a Ventimask as opposed to 3 L nasal cannula prior to admission.  Chief Complaint: Shortness of breath with episodic unresponsiveness  HPI: ALVINO LECHUGA is a 70 y.o. male with medical history significant for ischemic cardiomyopathy with known chronic systolic heart failure with an ejection fraction less than 25% based on cardiac catheterization in August, history of ventricular tachycardia status post AICD, prior tobacco abuse and apparent diagnosis of COPD, dyslipidemia, chronic atrial fibrillation on eliquis and memory loss. Patient has been hospitalized several times in 2017 as follows: He was hospitalized in April after syncope in relation to AICD discharge, he was hospitalized in August to undergo cardiac cardiac catheterization in regards to ICD discharge. He was admitted in early October and discharged within 24 hours for HCAP. At that time he was not requiring oxygen upon discharge. He was readmitted on 10/13 with recurrent respiratory symptoms resumed related to HCAP. He also  appeared dehydrated and was somewhat hypotensive so his carvedilol and Cozaar as well as diuretic were held. CT of the chest without contrast done during that admission revealed multifocal infiltrates in the bilateral lower lobes concerning for infection or inflammatory process. Patient was scheduled to follow-up with Dr. Stevenson Clinch of pulmonary medicine on 10/24. Because of transaminitis as well as concerns of QT prolongation on Levaquin his amiodarone was held. Unfortunately he remained hypoxemic with ambulation and required oxygen at 2 L/m upon discharge. He subsequently followed up with his primary care physician on 10/20 and room air resting saturations on 2 L oxygen was less than 90% so primary care physician increased him to 3 L/m. His blood pressures remain somewhat suboptimal so his carvedilol and his Cozaar remained on hold, his diuretic was also held although typical dosing regimen was prn. Prior to current admission patient was taking a breathing treatment and then wife return to check on him the patient was noted to be unresponsive and flailing his arms. Patient denied any palpitations or AICD shock. He has had a dry nonproductive cough. He has not had any fevers. He has not had any edema and actually reports weight loss. Patient reports that he was utilizing his nasal cannula during his nebulizer treatment.  ED Course:  Vital Signs: BP 125/62 (BP Location: Right Arm)   Pulse 62   Temp 97 F (36.1 C) (Oral)   Resp (!) 22   Ht _0  (1.6 m)   Wt 62.8 kg (138 lb 8 oz)   SpO2 91%   BMI 24.53 kg/m  2 view cxr: Increased interstitial and alveolar opacities within the left greater than the right worse since previous exam. CT head without contrast: No acute abnormalities, moderate atrophy Lab data: Sodium 140, potassium 4.1, CO2 25,  BUN 17, creatinine 0.98, calcium 7.4, glucose 121, albumin 2.0, total protein 5.0, AST 109, ALT 80, total bilirubin 1.1, troponin 0.05, lactic acid 1.14, WBCs 10,000  with neutrophils 80% and absolutely neutrophils 8%, hemoglobin 9.7, platelets 411,000 Medications and treatments: Vancomycin 1 g IV 1, Maxipime 2 g IV times  Review of Systems:  In addition to the HPI above,  No Fever-chills, myalgias or other constitutional symptoms No Headache, changes with Vision or hearing, new weakness, tingling, numbness in any extremity, dizziness, dysarthria or word finding difficulty, gait disturbance or imbalance, tremors or seizure activity No problems swallowing food or Liquids, indigestion/reflux, choking or coughing while eating, abdominal pain with or after eating No Chest pain, palpitations, orthopnea No Abdominal pain, N/V, melena,hematochezia, dark tarry stools, constipation No dysuria, malodorous urine, hematuria or flank pain No new skin rashes, lesions, masses or bruises, No new joint pains, aches, swelling or redness No recent unintentional weight gain  No polyuria, polydypsia or polyphagia   Past Medical History:  Diagnosis Date  . Acute lower GI bleeding 12/11/2011   "first time" (12/11/2011)  . Acute myocardial infarction, unspecified site, episode of care unspecified 1995   Pt living in [place], no stent, ?PTCA  . AICD (automatic cardioverter/defibrillator) present   . Allergic rhinitis, cause unspecified   . Anxiety   . Arthritis    "all over" (11/07/2015)  . Atrial fibrillation ([place])    a. 05/2015 - converted to sinus in setting of ICD shocks; placed on eliquis 5 bid.  [name] CAD (coronary artery disease), autologous vein bypass graft   . Cervical herniated disc    told not to lift >10 lbs  . Chronic systolic CHF (congestive heart failure), NYHA class 2 (HCC)    Reports EF of 25%.   [name] COPD (chronic obstructive pulmonary disease) ([place]) 11/2012   by xray  . Diverticulosis    by CT scan  . HCAP (healthcare-associated pneumonia) 11/06/2015  . Hypertension   . Insomnia   . Paroxysmal ventricular tachycardia ([place])   . Perennial allergic  rhinitis    only to dust mites  . Pneumonia 2000s   "walking pneumonia"  . VT (ventricular tachycardia) ([place])    a. 05/2015 - VT storm with multiple ICD shocks-->Amio 400 BID.    Past Surgical History:  Procedure Laterality Date  . CARDIAC CATHETERIZATION  2004   LAD 30%, D1 30%, CFX-AV groove 70-80%, OM1 30%, EF 20-25%  . CARDIAC CATHETERIZATION N/A 09/28/2015   Procedure: Left Heart Cath and Coronary Angiography;  Surgeon: Peter M Martinique, MD;  Location: [place] [facility];  Service: Cardiovascular;  Laterality: N/A;  . CARDIAC DEFIBRILLATOR PLACEMENT  2004  . CATARACT EXTRACTION W/ INTRAOCULAR LENS IMPLANT Right 01/2012  . COLONOSCOPY  01/08/2012   Procedure: COLONOSCOPY;  Surgeon: Gatha Mayer, MD;  Location: [facility];  Service: Endoscopy;  Laterality: N/A;  . CORONARY ANGIOPLASTY  1995   Pt thinks he got a balloon, living in [place], [place] N/A 02/07/2012   Procedure: IMPLANTABLE CARDIOVERTER DEFIBRILLATOR GENERATOR CHANGE;  Surgeon: Evans Lance, MD; Medtronic Evera XT VR single-chamber serial number [id number], Laterality: Left  . INSERT / REPLACE / REMOVE PACEMAKER  2004   Medtronic ICD  . KNEE ARTHROSCOPY Left 05/2003   Archie Endo 06/19/2010  . LAPAROSCOPIC CHOLECYSTECTOMY  1/ 2012  . SHOULDER ARTHROSCOPY W/ ROTATOR CUFF REPAIR Right twice  . TONSILLECTOMY AND ADENOIDECTOMY  ~ 25    Social History   Social  History  . Marital status: Married    Spouse name: N/A  . Number of children: 0  . Years of education: N/A   Occupational History  . [employer] truck driver (retired)    Social History Main Topics  . Smoking status: Former Smoker    Packs/day: 0.50    Years: 50.00    Types: Cigarettes, Cigars    Quit date: 09/27/2015  . Smokeless tobacco: Never Used  . Alcohol use No  . Drug use: No  . Sexual activity: Not Currently   Other Topics Concern  . Not on file   Social History Narrative   Lives with wife,  married 1998   Grown children, 2 great grandchildren   Occupation: retired, was Probation officer   Activity: walking, fishing   Diet: good water daily, fruits/vegetables rare      Wife is Economist.    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 No known agent orange exposure.      Mobility: Without assistive devices Work history: Not obtained   Allergies  Allergen Reactions  . Ace Inhibitors Other (See Comments)    muscle pain. Tolerates ARBs.   . Codeine Other (See Comments)    "head wants to explode."  . Doxycycline Diarrhea and Nausea And Vomiting  . Penicillins Swelling    "started at point of injection; w/in 3 min my upper arm was swollen 3 times normal" Has patient had a PCN reaction causing immediate rash, facial/tongue/throat swelling, SOB or lightheadedness with hypotension: Yes Has patient had a PCN reaction causing severe rash involving mucus membranes or skin necrosis: No Has patient had a PCN reaction that required hospitalization No Has patient had a PCN reaction occurring within the last 10 years: No If all of the above answers are "NO", then may proceed wi  . Lisinopril     Muscle Pain  . Statins Other (See Comments)    Myalgias per patient    Family History  Problem Relation Age of Onset  . Diabetes Father   . Tracheal cancer Father 65    smoker  . Stroke Mother   . Cancer Sister     left eye  . CAD Neg Hx   . Colon cancer Neg Hx   . Prostate cancer Neg Hx      Prior to Admission medications   Medication Sig Start Date End Date Taking? Authorizing Provider  apixaban (ELIQUIS) 5 MG TABS tablet Take 1 tablet (5 mg total) by mouth 2 (two) times daily. Resume 09/29/15 09/28/15  Yes Amber Sena Slate, NP  aspirin 81 MG EC tablet Take 1 tablet (81 mg total) by mouth daily. 05/28/15  Yes Rogelia Mire, NP  benzonatate (TESSALON) 200 MG capsule TAKE 1 CAPSULE BY MOUTH 3 TIMES DAILY AS NEEDED FOR COUGH Patient taking differently: TAKE 1 CAPSULE (200 mg) BY MOUTH 3 TIMES DAILY AS  NEEDED FOR COUGH 10/31/14  Yes Tonia Ghent, MD  diazepam (VALIUM) 5 MG tablet TAKE 1 TABLET BY MOUTH EVERY 6 HOURS AS NEEDED Patient taking differently: TAKE 1 TABLET BY MOUTH EVERY 6 HOURS AS NEEDED FOR ANXIETY 10/04/15  Yes Tonia Ghent, MD  donepezil (ARICEPT) 10 MG tablet Take 1 tablet (10 mg total) by mouth at bedtime. 11/01/15  Yes Tonia Ghent, MD  guaiFENesin (MUCINEX) 600 MG 12 hr tablet Take by mouth 2 (two) times daily.   Yes Historical Provider, MD  guaiFENesin-dextromethorphan (ROBITUSSIN DM) 100-10 MG/5ML syrup Take 5 mLs by mouth every 4 (four) hours as  needed for cough. 11/21/15  Yes Theodis Blaze, MD  HYDROcodone-acetaminophen (NORCO/VICODIN) 5-325 MG tablet TAKE 1 TABLET BY MOUTH EVERY 6 HOURS AS NEEDED FOR PAIN Patient taking differently: Take 1 tablet by mouth every 6 (six) hours as needed for moderate pain.  10/10/15  Yes Tonia Ghent, MD  levalbuterol Penne Lash) 1.25 MG/0.5ML nebulizer solution Take 1.25 mg by nebulization every 6 (six) hours as needed for wheezing or shortness of breath. 11/21/15  Yes Theodis Blaze, MD  levofloxacin (LEVAQUIN) 750 MG tablet Take 1 tablet (750 mg total) by mouth daily. 11/22/15  Yes Theodis Blaze, MD  loperamide (IMODIUM) 2 MG capsule Take 1 capsule (2 mg total) by mouth as needed for diarrhea or loose stools. 11/21/15  Yes Theodis Blaze, MD  metaxalone (SKELAXIN) 800 MG tablet TAKE 1 TABLET BY MOUTH 3 TIMES A DAY AS NEEDED FOR PAIN. Patient taking differently: Take 800 mg by mouth 3 (three) times daily as needed for muscle spasms.  10/10/15  Yes Tonia Ghent, MD  Multiple Vitamins-Minerals (MULTIVITAMIN ADULTS 50+) TABS Take 1 tablet by mouth daily.   Yes Historical Provider, MD  nitroGLYCERIN (NITROSTAT) 0.4 MG SL tablet PLACE 1 TABLET (0.4 MG TOTAL) UNDER THE TONGUE EVERY 5 (FIVE) MINUTES AS NEEDED. FOR CHEST PAIN. 11/20/15  Yes Tonia Ghent, MD  promethazine (PHENERGAN) 25 MG tablet Take 0.5-1 tablets (12.5-25 mg total) by mouth  every 8 (eight) hours as needed for nausea or vomiting. 11/14/15  Yes Tonia Ghent, MD  ranitidine (ZANTAC) 150 MG capsule Take 150 mg by mouth daily as needed for heartburn.    Yes Historical Provider, MD  ranolazine (RANEXA) 500 MG 12 hr tablet Take 1 tablet (500 mg total) by mouth 2 (two) times daily. 09/28/15  Yes Amber Sena Slate, NP  traMADol (ULTRAM) 50 MG tablet TAKE 1 TO 2 TABLETS BY MOUTH EVERY 8 HOURS AS NEEDED Patient taking differently: TAKE 1 TO 2 TABLETS BY MOUTH EVERY 8 HOURS AS NEEDED FOR PAIN. 10/24/15  Yes Tonia Ghent, MD  zolpidem (AMBIEN CR) 12.5 MG CR tablet TAKE 1 TABLET BY MOUTH AT BEDTIME Patient taking differently: TAKE 1 TABLET BY MOUTH AT BEDTIME AS NEEDED FOR SLEEP 07/19/15  Yes Tonia Ghent, MD  amiodarone (PACERONE) 200 MG tablet Take 200 mg by mouth Mon-Fri and 400 mg on Sat and Sun. Please start taking once completed therapy with Levaquin on October 25th, 2017. Patient not taking: Reported on 11/28/2015 11/29/15   Theodis Blaze, MD    Physical Exam: Vitals:   11/28/15 6378 11/28/15 0736 11/28/15 0742 11/28/15 0743  BP: 125/62     Pulse: 62     Resp: (!) 22     Temp: 97 F (36.1 C)     TempSrc: Oral     SpO2: 95% (!) 88% (!) 85% 91%  Weight: 62.8 kg (138 lb 8 oz)     Height: _0  (1.6 m)         Constitutional: NAD, calm, comfortable Eyes: PERRL, lids and conjunctivae normal ENMT: Mucous membranes are moist. Posterior pharynx clear of any exudate or lesions.Normal dentition.  Neck: normal, supple, no masses, no thyromegaly Respiratory: Diffuse bilateral crackles more prominent in the bases, Ventimask, Normal respiratory effort at rest. No accessory muscle use.  Cardiovascular: Regular rate and rhythm, no murmurs / rubs / gallops. No extremity edema. 2+ pedal pulses. No carotid bruits.  Abdomen: no tenderness, no masses palpated. No hepatosplenomegaly. Bowel sounds positive.  Musculoskeletal: no clubbing / cyanosis. No joint deformity upper and  lower extremities. Good ROM, no contractures. Normal muscle tone.  Skin: no rashes, lesions, ulcers. No induration Neurologic: CN 2-12 grossly intact. Sensation intact, DTR normal. Strength 5/5 x all 4 extremities.  Psychiatric: Normal judgment and insight. Alert and oriented x 3. Normal mood.    Labs on Admission: I have personally reviewed following labs and imaging studies  CBC:  Recent Labs Lab 11/28/15 0258  WBC 10.0  NEUTROABS 8.0*  HGB 9.7*  HCT 30.8*  MCV 87.5  PLT 010*   Basic Metabolic Panel:  Recent Labs Lab 11/28/15 0258  NA 140  K 4.1  CL 109  CO2 25  GLUCOSE 121*  BUN 17  CREATININE 0.98  CALCIUM 7.4*   GFR: Estimated Creatinine Clearance: 55.6 mL/min (by C-G formula based on SCr of 0.98 mg/dL). Liver Function Tests:  Recent Labs Lab 11/28/15 0258  AST 109*  ALT 80*  ALKPHOS 76  BILITOT 1.1  PROT 5.0*  ALBUMIN 2.0*   No results for input(s): LIPASE, AMYLASE in the last 168 hours. No results for input(s): AMMONIA in the last 168 hours. Coagulation Profile: No results for input(s): INR, PROTIME in the last 168 hours. Cardiac Enzymes:  Recent Labs Lab 11/28/15 0258  TROPONINI 0.05*   BNP (last 3 results) No results for input(s): PROBNP in the last 8760 hours. HbA1C: No results for input(s): HGBA1C in the last 72 hours. CBG: No results for input(s): GLUCAP in the last 168 hours. Lipid Profile: No results for input(s): CHOL, HDL, LDLCALC, TRIG, CHOLHDL, LDLDIRECT in the last 72 hours. Thyroid Function Tests: No results for input(s): TSH, T4TOTAL, FREET4, T3FREE, THYROIDAB in the last 72 hours. Anemia Panel: No results for input(s): VITAMINB12, FOLATE, FERRITIN, TIBC, IRON, RETICCTPCT in the last 72 hours. Urine analysis:    Component Value Date/Time   LABSPEC >=1.030 01/25/2010 0921   PHURINE 5.0 01/25/2010 0921   HGBUR moderate 01/25/2010 0921   BILIRUBINUR 1+ 11/06/2015 1605   PROTEINUR Neg 11/06/2015 1605   UROBILINOGEN 0.2  11/06/2015 1605   UROBILINOGEN 0.2 01/25/2010 0921   NITRITE Neg 11/06/2015 1605   NITRITE negative 01/25/2010 0921   LEUKOCYTESUR Negative 11/06/2015 1605   Sepsis Labs: _0 (procalcitonin:4,lacticidven:4) )No results found for this or any previous visit (from the past 240 hour(s)).   Radiological Exams on Admission: Dg Chest 2 View  Result Date: 11/28/2015 CLINICAL DATA:  Shortness of breath EXAM: CHEST  2 VIEW COMPARISON:  11/24/2015, 03/06/2011, CT 11/17/2015 FINDINGS: Left-sided single lead ICD. Lead placement similar compared to previous. There is cardiomegaly. Interval increase in interstitial opacities asymmetric in the left thorax. Slight increased interstitial opacities within the right lung base. Atherosclerosis of the aorta. No pneumothorax. IMPRESSION: 1. Increased interstitial and alveolar opacities within the left greater then right chest since the previous exam. 2. Stable degree of cardiomegaly. Electronically Signed   By: Donavan Foil M.D.   On: 11/28/2015 04:38   Ct Head Wo Contrast  Result Date: 11/28/2015 CLINICAL DATA:  Unresponsive, seizure like activity EXAM: CT HEAD WITHOUT CONTRAST TECHNIQUE: Contiguous axial images were obtained from the base of the skull through the vertex without intravenous contrast. COMPARISON:  10/13/2015 FINDINGS: Brain: No acute territorial infarction or hemorrhage. No focal mass, mass effect or midline shift. Mild to moderate cortical atrophy. Moderate periventricular and subcortical white matter small vessel ischemic changes. Ventricles are similar in size and configuration. Vascular: No hyperdense vessels. Calcifications within the carotid arteries at the skullbase.  Skull: No fracture.  Mastoid air cells clear. Sinuses/Orbits: Minimal mucosal thickening in the paranasal sinuses. No acute orbital abnormality. Other: None IMPRESSION: 1. No CT evidence for acute intracranial abnormality. 2. Moderate atrophy. Moderate periventricular and  subcortical white matter small vessel ischemic disease. Electronically Signed   By: Donavan Foil M.D.   On: 11/28/2015 04:35    EKG: (Independently reviewed) sinus rhythm with ventricular rate 63 bpm, QTC 553 ms in setting of underlying right bundle branch block,-table and iron change from previous EKGs  Assessment/Plan Principal Problem:   Acute on chronic respiratory failure with hypoxia  -Presents with progressive worsening of hypoxemia and interval development of left upper lobe infiltrate compared with previous chest x-ray -Differential includes recurrent HCAP versus idiopathic interstitial lung disease (ILD) versus atypical presentation of heart failure -Treat underlying causes (see below) -Continue supportive care with oxygen and nebs -Have consulted pulmonary medicine as well as cardiology -Check ABG -ESR and CRP to rule out significant inflammatory etiology that may respond to steroid therapy -Check Procalcitonin  Active Problems:   COPD (chronic obstructive pulmonary disease)  -Currently not actively wheezing and present respiratory symptoms do not appear consistent with COPD exacerbation -Xopenex nebs    Chronic systolic CHF (congestive heart failure) /known ischemic cardiomyopathy -Cardiac catheterization August 2017 with EF less than 25% -Has been off carvedilol, Cozaar and furosemide due to suboptimal blood pressure -No edema on exam and patient reports actual weight loss -Echocardiogram -Check BNP -Subtle elevation in troponin likely related to demand ischemia from hypoxemia but we'll cycle troponins and for completeness on exam -Continue Ranexa -Cardiology has been consulted; defer initiation of Lasix to Cards    HCAP (healthcare-associated pneumonia) -Just completed Levaquin -No fever and cough is dry and nonproductive -No leukocytosis but does still have elevation in neutrophils and absolute neutrophils -Check Procalcitonin -Pneumonia order set  initiated -Continue Maxipime and vancomycin as ordered in the ER; ED physician documented no indication to obtain blood cultures    Transaminitis -Improving -Suspected related to amiodarone which is currently on hold -CMET in a.m.     Chronic atrial fibrillation  -Currently rate controlled -Off amiodarone secondary to recent utilization of Levaquin for pneumonia (did not wish to prolong QT and precipitate VT) -Continue eliquis and baby aspirin -CHADVASc = 3 -Check TSH    Physical deconditioning -PT and OT evaluation    Anemia -Hemoglobin has decreased nearly 2 g from 11.0 to 9.7 since 10/16 -?? Dilutional versus true anemia -No reports of dark or bloody stool -Anemia panel -Follow up on TSH    Hyperlipidemia -Not on statin prior to admission    History of ventricular tachycardia/ AICD (automatic cardioverter/defibrillator) present -Patient denies recent discharges -Amiodarone on hold as above    Memory loss -Continue Aricept         DVT prophylaxis: Eliquis Code Status: Full Family Communication: Wife at bedside Disposition Plan: Anticipate discharge back to preadmission home environment when medically stable Consults called: Pulmonary/Dr. Titus Mould; cardiology/[facility] on-call     Samella Parr ANP-BC [facility] Pager [phone]   If 7PM-7AM, please contact night-coverage [email or web address] Password [email or web address]  11/28/2015, 8:26 AM

## 2015-11-28 NOTE — Discharge Instructions (Signed)

## 2015-11-28 NOTE — ED Triage Notes (Signed)
Pt BIB by [facility]. Per EMS pt is on 4L [place] at home.  Pt recently diagnosed with pneumonia, due to complete course of Levaquin today.  Today started having increased shortness of breath.  EMS was out earlier and gave the pt a breathing tx but this worsened his symptoms.  Initial O2 sats were in the mid 80s.  Now on 8L via mask pt's O2 sats are in the high 90s.

## 2015-11-28 NOTE — Consult Note (Signed)
   [facility] CM Inpatient Consult   11/28/2015  [name] 1946-01-22 [id number]   Patient is currently active with [facility] Care Management for chronic disease management services.  Patient has been engaged by a [employer]. Chart review reveals the patient is a 70 year old male with extensive cardiac history and several recent admissions for HCAP which he failed several out patient antibody treatments. Now with worsening diffuse bilateral infiltrates.  Came by to see the patient and he was receiving nursing care.  Will follow for community care management needs and disposition.    Our community based plan of care has focused on disease management and community resource support.  Patient will receive a post discharge transition of care call and will be evaluated for monthly home visits for assessments and disease process education.  Of note, [facility] Care Management services does not replace or interfere with any services that are needed or arranged by inpatient case management or social work.  For additional questions or referrals please contact:  Charlesetta Shanks, RN BSN CCM [facility]  [phone] business mobile phone Toll free office [phone]

## 2015-11-29 ENCOUNTER — Telehealth: Payer: Self-pay

## 2015-11-29 ENCOUNTER — Inpatient Hospital Stay (HOSPITAL_COMMUNITY): Payer: Medicare Other

## 2015-11-29 DIAGNOSIS — I472 Ventricular tachycardia: Secondary | ICD-10-CM

## 2015-11-29 DIAGNOSIS — R918 Other nonspecific abnormal finding of lung field: Secondary | ICD-10-CM

## 2015-11-29 DIAGNOSIS — I4901 Ventricular fibrillation: Secondary | ICD-10-CM

## 2015-11-29 DIAGNOSIS — I509 Heart failure, unspecified: Secondary | ICD-10-CM

## 2015-11-29 DIAGNOSIS — R55 Syncope and collapse: Secondary | ICD-10-CM

## 2015-11-29 DIAGNOSIS — Z4502 Encounter for adjustment and management of automatic implantable cardiac defibrillator: Secondary | ICD-10-CM

## 2015-11-29 DIAGNOSIS — E440 Moderate protein-calorie malnutrition: Secondary | ICD-10-CM | POA: Insufficient documentation

## 2015-11-29 LAB — COMPREHENSIVE METABOLIC PANEL
ALBUMIN: 2 g/dL — AB (ref 3.5–5.0)
ALK PHOS: 80 U/L (ref 38–126)
ALT: 79 U/L — ABNORMAL HIGH (ref 17–63)
ANION GAP: 8 (ref 5–15)
AST: 91 U/L — ABNORMAL HIGH (ref 15–41)
BILIRUBIN TOTAL: 1 mg/dL (ref 0.3–1.2)
BUN: 14 mg/dL (ref 6–20)
CALCIUM: 7.6 mg/dL — AB (ref 8.9–10.3)
CO2: 25 mmol/L (ref 22–32)
Chloride: 104 mmol/L (ref 101–111)
Creatinine, Ser: 0.92 mg/dL (ref 0.61–1.24)
GFR calc Af Amer: >60 mL/min (ref >60)
GLUCOSE: 178 mg/dL — AB (ref 65–99)
POTASSIUM: 4.2 mmol/L (ref 3.5–5.1)
Sodium: 137 mmol/L (ref 135–145)
TOTAL PROTEIN: 5.3 g/dL — AB (ref 6.5–8.1)

## 2015-11-29 LAB — CBC
HCT: 32.7 % — ABNORMAL LOW (ref 39.0–52.0)
Hemoglobin: 10.2 g/dL — ABNORMAL LOW (ref 13.0–17.0)
MCH: 27.5 pg (ref 26.0–34.0)
MCHC: 31.2 g/dL (ref 30.0–36.0)
MCV: 88.1 fL (ref 78.0–100.0)
PLATELETS: 475 10*3/uL — AB (ref 150–400)
RBC: 3.71 MIL/uL — ABNORMAL LOW (ref 4.22–5.81)
RDW: 15.1 % (ref 11.5–15.5)
WBC: 9.2 10*3/uL (ref 4.0–10.5)

## 2015-11-29 LAB — GLUCOSE, CAPILLARY: GLUCOSE-CAPILLARY: 176 mg/dL — AB (ref 65–99)

## 2015-11-29 LAB — T4, FREE: Free T4: 2.06 ng/dL — ABNORMAL HIGH (ref 0.61–1.12)

## 2015-11-29 LAB — ANTI-DNA ANTIBODY, DOUBLE-STRANDED

## 2015-11-29 LAB — C4 COMPLEMENT: COMPLEMENT C4, BODY FLUID: 9 mg/dL — AB (ref 14–44)

## 2015-11-29 LAB — MRSA PCR SCREENING: MRSA by PCR: NEGATIVE

## 2015-11-29 LAB — MPO/PR-3 (ANCA) ANTIBODIES

## 2015-11-29 LAB — RHEUMATOID FACTOR: Rhuematoid fact SerPl-aCnc: 12.1 IU/mL (ref 0.0–13.9)

## 2015-11-29 LAB — MAGNESIUM: MAGNESIUM: 1.8 mg/dL (ref 1.7–2.4)

## 2015-11-29 LAB — COMPLEMENT, TOTAL: COMPL TOTAL (CH50): 47 U/mL (ref 42–60)

## 2015-11-29 LAB — C3 COMPLEMENT: C3 COMPLEMENT: 78 mg/dL — AB (ref 82–167)

## 2015-11-29 LAB — ANTINUCLEAR ANTIBODIES, IFA: ANA Ab, IFA: NEGATIVE

## 2015-11-29 MED ORDER — MEXILETINE HCL 150 MG PO CAPS
150.0000 mg | ORAL_CAPSULE | ORAL | Status: AC
  Administered 2015-11-29: 150 mg via ORAL

## 2015-11-29 MED ORDER — LOPERAMIDE HCL 2 MG PO CAPS
2.0000 mg | ORAL_CAPSULE | ORAL | Status: DC | PRN
  Administered 2015-11-29 – 2015-12-05 (×14): 2 mg via ORAL
  Filled 2015-11-29 (×14): qty 1

## 2015-11-29 MED ORDER — LIDOCAINE BOLUS VIA INFUSION
100.0000 mg | Freq: Once | INTRAVENOUS | Status: AC
  Administered 2015-11-29: 100 mg via INTRAVENOUS
  Filled 2015-11-29: qty 100

## 2015-11-29 MED ORDER — MEXILETINE HCL 150 MG PO CAPS
150.0000 mg | ORAL_CAPSULE | Freq: Three times a day (TID) | ORAL | Status: DC
  Administered 2015-11-29 – 2015-11-30 (×4): 150 mg via ORAL
  Filled 2015-11-29 (×5): qty 1

## 2015-11-29 MED ORDER — BOOST / RESOURCE BREEZE PO LIQD
1.0000 | Freq: Three times a day (TID) | ORAL | Status: DC
  Administered 2015-11-29 – 2015-12-06 (×12): 1 via ORAL

## 2015-11-29 MED ORDER — FUROSEMIDE 10 MG/ML IJ SOLN
40.0000 mg | Freq: Two times a day (BID) | INTRAMUSCULAR | Status: DC
  Administered 2015-11-29 – 2015-11-30 (×4): 40 mg via INTRAVENOUS
  Filled 2015-11-29 (×4): qty 4

## 2015-11-29 MED ORDER — LIDOCAINE IN D5W 4-5 MG/ML-% IV SOLN
2.0000 mg/min | INTRAVENOUS | Status: DC
  Administered 2015-11-29 – 2015-11-30 (×2): 2 mg/min via INTRAVENOUS
  Filled 2015-11-29 (×2): qty 500

## 2015-11-29 MED ORDER — MAGNESIUM SULFATE 2 GM/50ML IV SOLN
2.0000 g | Freq: Once | INTRAVENOUS | Status: AC
  Administered 2015-11-29: 2 g via INTRAVENOUS
  Filled 2015-11-29: qty 50

## 2015-11-29 NOTE — Telephone Encounter (Signed)
Verbal order given to [facility] as instructed by telephone.

## 2015-11-29 NOTE — Progress Notes (Signed)
Patient had another unresponsive episode. Pt was aware that it was going to happen this time and stated that he "felt weird and clammy". Gypsy Balsam, NP and attending MD aware. Rapid Response called. After episode, patient alert and in no distress. Orders placed to have patient transferred to stepdown.  Will call report once patient has bed placement.

## 2015-11-29 NOTE — Progress Notes (Signed)
   Introduced Physicist, medical.  Met w/ pt. & family (wife).  Will follow, as needed.  - Rev. Donna MDiv ThM

## 2015-11-29 NOTE — Progress Notes (Signed)
PROGRESS NOTE    [name]  ZOX:[id number] DOB: 03-31-1945 DOA: 11/28/2015 PCP: Crawford Givens, MD    Brief Narrative:  70 yo male with systolic ischemic cardiomyopathy ef 25%, presented with dyspnea and syncope. Admitted with working diagnosis of pulmonary edema to rule out pneumonia. Developed VT and VF with syncope on the medical ward.    Assessment & Plan:   Principal Problem:   Acute on chronic respiratory failure with hypoxia (HCC) Active Problems:   Hyperlipidemia   COPD (chronic obstructive pulmonary disease) (HCC)   Chronic systolic CHF (congestive heart failure) (HCC)   Ischemic cardiomyopathy   History of ventricular tachycardia   Chronic atrial fibrillation (HCC)   Memory loss   HCAP (healthcare-associated pneumonia)   AICD (automatic cardioverter/defibrillator) present   Physical deconditioning   Transaminitis   Anemia   Elevated troponin   Hypoxia   1. Syncope. Possible related to ventricular arrhythmia, will keep K at 4 and Mg at 2. Will continue telemetry monitor in the step down unit. Follow Ep recommendations, started on mexiletin. Personally reviewed telemetry noted VT and V fib, discussed with Cardiology.   2. VT/VF. Will continue telemetry monitoring, started on mexiletin.   3. Systolic ischemic heart failure. Will target negative fluid balance with furosemide, holding b blocker and ace inh to prevent hemodynamic decompensation. Continue furosemide 40 mg bid. Continue renexa for cad. Urine output 2300 cc since admission.   4. Atrial fibrillation. Will continue anticoagulation with apixban.   5. Anemia. Hb and hct stable at 10.2, no signs of bleeding.   6. Possible pneumonia. Chest film personally reviewed, noted new infiltrate on the left upper lobe, will continue antibiotic therapy, with cefepime and vancomycin, ( recent hospitalization), follow on cultures. Continue oxymetry monitoring, keep 02 sat above 92%, will use bipap if needed for  increase work of breathing.    7. Hypoxic respiratory failure. Due to left upper lobe infiltrate, will continue supportive care with 02 and broad spectrum antibiotic therapy. On methylprednisolone per pulmonary recommendations. On levalbuterol as needed.   8. Anxiety. Will continue diazepam as needed.   DVT prophylaxis: apixaban  Code Status: full  Family Communication: No family at the bedside   Disposition Plan:  Consultants:   Cardiology  Electrophysiology  Pulmonary   Procedures:   Antimicrobials:   Subjective: Patient had episode of desaturation down to 60's and syncope, telemetry with VT and Vfib. Device fired, patient returned to sinus. No chest pain but positive dyspnea. No nausea or vomiting.   Objective: Vitals:   11/29/15 0506 11/29/15 0954 11/29/15 1201 11/29/15 1328  BP: 100/61 (!) 101/58 (!) 102/59 (!) 131/59  Pulse: 66 73 65 77  Resp: 18 20 20 18   Temp: 98 F (36.7 C) 98.1 F (36.7 C) 97.7 F (36.5 C) 97.6 F (36.4 C)  TempSrc: Oral Axillary Oral Oral  SpO2: 90% 90% 93% 92%  Weight: 63.7 kg (140 lb 6.4 oz)     Height:        Intake/Output Summary (Last 24 hours) at 11/29/15 1331 Last data filed at 11/29/15 1232  Gross per 24 hour  Intake              240 ml  Output             2350 ml  Net            -2110 ml   Filed Weights   11/28/15 0642 11/29/15 0506  Weight: 62.8 kg (138 lb 8 oz) 63.7  kg (140 lb 6.4 oz)    Examination:  General exam: ill looking appearing, diaphoretic E ENT: mild conjunctival pallor, oral mucosa moist.  Respiratory system: positive rales left base, no wheezing or rhonchi.   Cardiovascular system: S1 & S2 heard, RRR. No JVD, rubs, gallops or clicks. No pedal edema. Precordial systolic murmur.  Gastrointestinal system: Abdomen is nondistended, soft and nontender. No organomegaly or masses felt. Normal bowel sounds heard. Central nervous system: Alert and oriented. No focal neurological deficits. Extremities: Symmetric  5 x 5 power. Skin: No rashes, lesions or ulcers    Data Reviewed: I have personally reviewed following labs and imaging studies  CBC:  Recent Labs Lab 11/28/15 0258 11/29/15 0012  WBC 10.0 9.2  NEUTROABS 8.0*  --   HGB 9.7* 10.2*  HCT 30.8* 32.7*  MCV 87.5 88.1  PLT 411* 475*   Basic Metabolic Panel:  Recent Labs Lab 11/28/15 0258 11/28/15 0800 11/29/15 0012  NA 140  --  137  K 4.1  --  4.2  CL 109  --  104  CO2 25  --  25  GLUCOSE 121*  --  178*  BUN 17  --  14  CREATININE 0.98  --  0.92  CALCIUM 7.4*  --  7.6*  MG  --  1.9  --   PHOS  --  2.5  --    GFR: Estimated Creatinine Clearance: 59.3 mL/min (by C-G formula based on SCr of 0.92 mg/dL). Liver Function Tests:  Recent Labs Lab 11/28/15 0258 11/29/15 0012  AST 109* 91*  ALT 80* 79*  ALKPHOS 76 80  BILITOT 1.1 1.0  PROT 5.0* 5.3*  ALBUMIN 2.0* 2.0*   No results for input(s): LIPASE, AMYLASE in the last 168 hours. No results for input(s): AMMONIA in the last 168 hours. Coagulation Profile: No results for input(s): INR, PROTIME in the last 168 hours. Cardiac Enzymes:  Recent Labs Lab 11/28/15 0258 11/28/15 1018  TROPONINI 0.05* 0.04*   BNP (last 3 results) No results for input(s): PROBNP in the last 8760 hours. HbA1C: No results for input(s): HGBA1C in the last 72 hours. CBG:  Recent Labs Lab 11/29/15 0915  GLUCAP 176*   Lipid Profile: No results for input(s): CHOL, HDL, LDLCALC, TRIG, CHOLHDL, LDLDIRECT in the last 72 hours. Thyroid Function Tests:  Recent Labs  11/28/15 0800 11/29/15 0012  TSH 0.035*  --   FREET4  --  2.06*   Anemia Panel:  Recent Labs  11/28/15 1018  VITAMINB12 450  FOLATE 34.9  FERRITIN 312  TIBC 206*  IRON 26*  RETICCTPCT 2.5   Sepsis Labs:  Recent Labs Lab 11/28/15 0301 11/28/15 0737  PROCALCITON  --  <0.10  LATICACIDVEN 1.14  --     No results found for this or any previous visit (from the past 240 hour(s)).       Radiology  Studies: Dg Chest 2 View  Result Date: 11/28/2015 CLINICAL DATA:  Shortness of breath EXAM: CHEST  2 VIEW COMPARISON:  11/24/2015, 03/06/2011, CT 11/17/2015 FINDINGS: Left-sided single lead ICD. Lead placement similar compared to previous. There is cardiomegaly. Interval increase in interstitial opacities asymmetric in the left thorax. Slight increased interstitial opacities within the right lung base. Atherosclerosis of the aorta. No pneumothorax. IMPRESSION: 1. Increased interstitial and alveolar opacities within the left greater then right chest since the previous exam. 2. Stable degree of cardiomegaly. Electronically Signed   By: Jasmine Pang M.D.   On: 11/28/2015 04:38   Ct Head  Wo Contrast  Result Date: 11/28/2015 CLINICAL DATA:  Unresponsive, seizure like activity EXAM: CT HEAD WITHOUT CONTRAST TECHNIQUE: Contiguous axial images were obtained from the base of the skull through the vertex without intravenous contrast. COMPARISON:  10/13/2015 FINDINGS: Brain: No acute territorial infarction or hemorrhage. No focal mass, mass effect or midline shift. Mild to moderate cortical atrophy. Moderate periventricular and subcortical white matter small vessel ischemic changes. Ventricles are similar in size and configuration. Vascular: No hyperdense vessels. Calcifications within the carotid arteries at the skullbase. Skull: No fracture.  Mastoid air cells clear. Sinuses/Orbits: Minimal mucosal thickening in the paranasal sinuses. No acute orbital abnormality. Other: None IMPRESSION: 1. No CT evidence for acute intracranial abnormality. 2. Moderate atrophy. Moderate periventricular and subcortical white matter small vessel ischemic disease. Electronically Signed   By: Jasmine PangKim  Fujinaga M.D.   On: 11/28/2015 04:35   Dg Chest Port 1 View  Result Date: 11/29/2015 CLINICAL DATA:  Dyspnea EXAM: PORTABLE CHEST 1 VIEW COMPARISON:  Chest radiograph from one day prior. FINDINGS: Stable configuration of single lead left  subclavian ICD. Stable cardiomediastinal silhouette with mild cardiomegaly and aortic atherosclerosis. No pneumothorax. No pleural effusion. Re- demonstrated are extensive patchy and reticular opacities throughout both lungs, asymmetrically involving the left upper lung, not appreciably changed. IMPRESSION: Stable chest radiograph. Stable cardiomegaly and extensive patchy and reticular opacities throughout both lungs, asymmetrically involving the left upper lung, for which the differential includes asymmetric pulmonary edema and/or pneumonia, with underlying interstitial lung disease not excluded. Electronically Signed   By: Delbert PhenixJason A Poff M.D.   On: 11/29/2015 09:52        Scheduled Meds: . apixaban  5 mg Oral BID  . aspirin EC  81 mg Oral Daily  . ceFEPime (MAXIPIME) IV  1 g Intravenous Q8H  . donepezil  10 mg Oral QHS  . famotidine  20 mg Oral Daily  . furosemide  40 mg Intravenous BID  . guaiFENesin  600 mg Oral BID  . loratadine  10 mg Oral Daily  . methylPREDNISolone (SOLU-MEDROL) injection  40 mg Intravenous BID  . mexiletine  150 mg Oral Q8H  . multivitamin with minerals  1 tablet Oral Daily  . ranolazine  500 mg Oral BID  . sodium chloride flush  3 mL Intravenous Q12H  . vancomycin  500 mg Intravenous Q12H   Continuous Infusions:    LOS: 1 day       Maira Christon Annett Gulaaniel Yafet Cline, MD [facility] Pager [phone]  If 7PM-7AM, please contact night-coverage [email or web address] Password [email or web address] 11/29/2015, 1:31 PM

## 2015-11-29 NOTE — Consult Note (Signed)
ELECTROPHYSIOLOGY CONSULT NOTE    Patient ID: [name] MRN: [id number], DOB/AGE: [birth year] 70 y.o.  Admit date: 11/28/2015 Date of Consult: 11/29/2015  Primary Physician: Crawford Givens, MD Electrophysiologist: Isaias Cowman MD: [facility]  Reason for Consultation: VT  HPI:  [name] is a 70 y.o. male with a past medical history significant for persistent atrial fibrillation, prior GI bleeding, chronic systolic heart failure, recurrent pneumonia, possible amiodarone lung toxicity and recurrent ventricular tachycardia. He has had a complicated course recently with admissions for pneumonia.  He was admitted 11/28/15 for syncope and shortness of breath. He has been seen by PCCM who is concerned for amiodarone toxicity and has started steroids for treatment.  Today, he had an episode of VT which degenerated into VF with ATP treatment and he received ICD shock. EP has been asked to evaluate for treatment options.    Cardiac catheterization on 09/28/15 demonstrated 2 vessel obstructive CAD stable from 2004, severe LV dysfunction, elevated LVEDP.  Echo is pending this admission.   He currently states that his shortness of breath is at baseline. No chest pain. He has fatigue and weakness.  He has not had recent fevers or chills.   Past Medical History:  Diagnosis Date  . Acute lower GI bleeding 12/11/2011   "first time" (12/11/2011)  . Acute myocardial infarction, unspecified site, episode of care unspecified 1995   Pt living in [place], no stent, ?PTCA  . AICD (automatic cardioverter/defibrillator) present   . Allergic rhinitis, cause unspecified   . Anxiety   . Arthritis    "all over" (11/07/2015)  . Atrial fibrillation (HCC)    a. 05/2015 - converted to sinus in setting of ICD shocks; placed on eliquis 5 bid.  [name] CAD (coronary artery disease), autologous vein bypass graft   . Cervical herniated disc    told not to lift >10 lbs  . Chronic systolic CHF (congestive heart  failure), NYHA class 2 (HCC)    Reports EF of 25%.   [name] COPD (chronic obstructive pulmonary disease) (HCC) 11/2012   by xray  . Diverticulosis    by CT scan  . HCAP (healthcare-associated pneumonia) 11/06/2015  . Hypertension   . Insomnia   . Paroxysmal ventricular tachycardia (HCC)   . Perennial allergic rhinitis    only to dust mites  . Pneumonia 2000s   "walking pneumonia"  . VT (ventricular tachycardia) (HCC)    a. 05/2015 - VT storm with multiple ICD shocks-->Amio 400 BID.     Surgical History:  Past Surgical History:  Procedure Laterality Date  . CARDIAC CATHETERIZATION  2004   LAD 30%, D1 30%, CFX-AV groove 70-80%, OM1 30%, EF 20-25%  . CARDIAC CATHETERIZATION N/A 09/28/2015   Procedure: Left Heart Cath and Coronary Angiography;  Surgeon: Peter M Swaziland, MD;  Location: [facility] INVASIVE CV LAB;  Service: Cardiovascular;  Laterality: N/A;  . CARDIAC DEFIBRILLATOR PLACEMENT  2004  . CATARACT EXTRACTION W/ INTRAOCULAR LENS IMPLANT Right 01/2012  . COLONOSCOPY  01/08/2012   Procedure: COLONOSCOPY;  Surgeon: Iva Boop, MD;  Location: [facility] ENDOSCOPY;  Service: Endoscopy;  Laterality: N/A;  . CORONARY ANGIOPLASTY  1995   Pt thinks he got a balloon, living in [place], [place]  . IMPLANTABLE CARDIOVERTER DEFIBRILLATOR GENERATOR CHANGE N/A 02/07/2012   Procedure: IMPLANTABLE CARDIOVERTER DEFIBRILLATOR GENERATOR CHANGE;  Surgeon: Marinus Maw, MD; Medtronic Evera XT VR single-chamber serial number [id number], Laterality: Left  . INSERT / REPLACE / REMOVE PACEMAKER  2004   Medtronic ICD  .  KNEE ARTHROSCOPY Left 05/2003   Hattie Perch/notes 06/19/2010  . LAPAROSCOPIC CHOLECYSTECTOMY  1/ 2012  . SHOULDER ARTHROSCOPY W/ ROTATOR CUFF REPAIR Right twice  . TONSILLECTOMY AND ADENOIDECTOMY  ~ 1951     Prescriptions Prior to Admission  Medication Sig Dispense Refill Last Dose  . apixaban (ELIQUIS) 5 MG TABS tablet Take 1 tablet (5 mg total) by mouth 2 (two) times daily. Resume 09/29/15 60 tablet 6 11/27/2015  at 2200  . aspirin 81 MG EC tablet Take 1 tablet (81 mg total) by mouth daily.   11/27/2015 at Unknown time  . benzonatate (TESSALON) 200 MG capsule TAKE 1 CAPSULE BY MOUTH 3 TIMES DAILY AS NEEDED FOR COUGH (Patient taking differently: TAKE 1 CAPSULE (200 mg) BY MOUTH 3 TIMES DAILY AS NEEDED FOR COUGH) 30 capsule 1 unknown  . diazepam (VALIUM) 5 MG tablet TAKE 1 TABLET BY MOUTH EVERY 6 HOURS AS NEEDED (Patient taking differently: TAKE 1 TABLET BY MOUTH EVERY 6 HOURS AS NEEDED FOR ANXIETY) 45 tablet 0 unknown  . donepezil (ARICEPT) 10 MG tablet Take 1 tablet (10 mg total) by mouth at bedtime. 90 tablet 1 11/27/2015 at Unknown time  . fexofenadine-pseudoephedrine (ALLEGRA-D 24) 180-240 MG 24 hr tablet Take 1 tablet by mouth daily.   Past Week at Unknown time  . guaiFENesin (MUCINEX) 600 MG 12 hr tablet Take by mouth 2 (two) times daily.   11/27/2015 at Unknown time  . guaiFENesin-dextromethorphan (ROBITUSSIN DM) 100-10 MG/5ML syrup Take 5 mLs by mouth every 4 (four) hours as needed for cough. 118 mL 0 unknown  . HYDROcodone-acetaminophen (NORCO/VICODIN) 5-325 MG tablet TAKE 1 TABLET BY MOUTH EVERY 6 HOURS AS NEEDED FOR PAIN (Patient taking differently: Take 1 tablet by mouth every 6 (six) hours as needed for moderate pain. ) 30 tablet 0 Past Week at Unknown time  . levalbuterol (XOPENEX) 1.25 MG/0.5ML nebulizer solution Take 1.25 mg by nebulization every 6 (six) hours as needed for wheezing or shortness of breath. 1 each 12 11/27/2015 at Unknown time  . levofloxacin (LEVAQUIN) 750 MG tablet Take 1 tablet (750 mg total) by mouth daily. 7 tablet 0 11/27/2015 at Unknown time  . loperamide (IMODIUM) 2 MG capsule Take 1 capsule (2 mg total) by mouth as needed for diarrhea or loose stools. 30 capsule 0 11/27/2015 at Unknown time  . metaxalone (SKELAXIN) 800 MG tablet TAKE 1 TABLET BY MOUTH 3 TIMES A DAY AS NEEDED FOR PAIN. (Patient taking differently: Take 800 mg by mouth 3 (three) times daily as needed for  muscle spasms. ) 90 tablet 1 Past Week at Unknown time  . Multiple Vitamins-Minerals (MULTIVITAMIN ADULTS 50+) TABS Take 1 tablet by mouth daily.   11/27/2015 at Unknown time  . nitroGLYCERIN (NITROSTAT) 0.4 MG SL tablet PLACE 1 TABLET (0.4 MG TOTAL) UNDER THE TONGUE EVERY 5 (FIVE) MINUTES AS NEEDED. FOR CHEST PAIN. 25 tablet 1 unknown  . promethazine (PHENERGAN) 25 MG tablet Take 0.5-1 tablets (12.5-25 mg total) by mouth every 8 (eight) hours as needed for nausea or vomiting. 30 tablet 0 unknown  . ranitidine (ZANTAC) 150 MG capsule Take 150 mg by mouth daily as needed for heartburn.    unknown  . ranolazine (RANEXA) 500 MG 12 hr tablet Take 1 tablet (500 mg total) by mouth 2 (two) times daily. 60 tablet 1 11/27/2015 at Unknown time  . traMADol (ULTRAM) 50 MG tablet TAKE 1 TO 2 TABLETS BY MOUTH EVERY 8 HOURS AS NEEDED (Patient taking differently: TAKE 1 TO 2 TABLETS  BY MOUTH EVERY 8 HOURS AS NEEDED FOR PAIN.) 100 tablet 0 unknown  . zolpidem (AMBIEN CR) 12.5 MG CR tablet TAKE 1 TABLET BY MOUTH AT BEDTIME (Patient taking differently: TAKE 1 TABLET BY MOUTH AT BEDTIME AS NEEDED FOR SLEEP) 30 tablet 5 unknown  . amiodarone (PACERONE) 200 MG tablet Take 200 mg by mouth Mon-Fri and 400 mg on Sat and Sun. Please start taking once completed therapy with Levaquin on October 25th, 2017. (Patient not taking: Reported on 11/28/2015)  0 Not Taking at Unknown time    Inpatient Medications:  . apixaban  5 mg Oral BID  . aspirin EC  81 mg Oral Daily  . ceFEPime (MAXIPIME) IV  1 g Intravenous Q8H  . donepezil  10 mg Oral QHS  . famotidine  20 mg Oral Daily  . furosemide  40 mg Intravenous BID  . guaiFENesin  600 mg Oral BID  . loratadine  10 mg Oral Daily  . methylPREDNISolone (SOLU-MEDROL) injection  40 mg Intravenous BID  . multivitamin with minerals  1 tablet Oral Daily  . ranolazine  500 mg Oral BID  . sodium chloride flush  3 mL Intravenous Q12H  . vancomycin  500 mg Intravenous Q12H    Allergies:    Allergies  Allergen Reactions  . Ace Inhibitors Other (See Comments)    muscle pain. Tolerates ARBs.   . Codeine Other (See Comments)    "head wants to explode."  . Doxycycline Diarrhea and Nausea And Vomiting  . Penicillins Swelling    "started at point of injection; w/in 3 min my upper arm was swollen 3 times normal" Has patient had a PCN reaction causing immediate rash, facial/tongue/throat swelling, SOB or lightheadedness with hypotension: Yes Has patient had a PCN reaction causing severe rash involving mucus membranes or skin necrosis: No Has patient had a PCN reaction that required hospitalization No Has patient had a PCN reaction occurring within the last 10 years: No If all of the above answers are "NO", then may proceed wi  . Lisinopril     Muscle Pain  . Statins Other (See Comments)    Myalgias per patient    Social History   Social History  . Marital status: Married    Spouse name: N/A  . Number of children: 0  . Years of education: N/A   Occupational History  . [employer] truck driver (retired)    Social History Main Topics  . Smoking status: Former Smoker    Packs/day: 0.50    Years: 50.00    Types: Cigarettes, Cigars    Quit date: 09/27/2015  . Smokeless tobacco: Never Used  . Alcohol use No  . Drug use: No  . Sexual activity: Not Currently   Other Topics Concern  . Not on file   Social History Narrative   Lives with wife, married 1998   Grown children, 2 great grandchildren   Occupation: retired, was Presenter, broadcasting   Activity: walking, fishing   Diet: good water daily, fruits/vegetables rare      Wife is Product manager.    [id number], Human resources officer. No known agent orange exposure.       Family History  Problem Relation Age of Onset  . Diabetes Father   . Tracheal cancer Father 104    smoker  . Stroke Mother   . Cancer Sister     left eye  . CAD Neg Hx   . Colon cancer Neg Hx   . Prostate cancer Neg Hx  Review of Systems: All other systems reviewed  and are otherwise negative except as noted above.  Physical Exam: Vitals:   11/28/15 1201 11/28/15 2000 11/29/15 0506 11/29/15 0954  BP: 126/66 126/79 100/61 (!) 101/58  Pulse: (!) 57 60 66 73  Resp: 18 18 18 20   Temp:  97.9 F (36.6 C) 98 F (36.7 C) 98.1 F (36.7 C)  TempSrc:  Oral Oral Axillary  SpO2: 95% 96% 90% 90%  Weight:   140 lb 6.4 oz (63.7 kg)   Height:        GEN- The patient is elderly and chronically ill appearing, alert and oriented x 3 today.   HEENT: normocephalic, atraumatic; sclera clear, conjunctiva pink; hearing intact; oropharynx clear; neck supple Lungs- Clear to ausculation bilaterally, normal work of breathing.  No wheezes, rales, rhonchi Heart- Regular rate and rhythm  GI- soft, non-tender, non-distended, bowel sounds present  Extremities- no clubbing, cyanosis, or edema  MS- no significant deformity or atrophy Skin- warm and dry, no rash or lesion Psych- euthymic mood, full affect Neuro- strength and sensation are intact  Labs:   Lab Results  Component Value Date   WBC 9.2 11/29/2015   HGB 10.2 (L) 11/29/2015   HCT 32.7 (L) 11/29/2015   MCV 88.1 11/29/2015   PLT 475 (H) 11/29/2015    Recent Labs Lab 11/29/15 0012  NA 137  K 4.2  CL 104  CO2 25  BUN 14  CREATININE 0.92  CALCIUM 7.6*  PROT 5.3*  BILITOT 1.0  ALKPHOS 80  ALT 79*  AST 91*  GLUCOSE 178*      Radiology/Studies: Dg Chest 2 View Result Date: 11/28/2015 CLINICAL DATA:  Shortness of breath EXAM: CHEST  2 VIEW COMPARISON:  11/24/2015, 03/06/2011, CT 11/17/2015 FINDINGS: Left-sided single lead ICD. Lead placement similar compared to previous. There is cardiomegaly. Interval increase in interstitial opacities asymmetric in the left thorax. Slight increased interstitial opacities within the right lung base. Atherosclerosis of the aorta. No pneumothorax. IMPRESSION: 1. Increased interstitial and alveolar opacities within the left greater then right chest since the previous exam.  2. Stable degree of cardiomegaly. Electronically Signed   By: Jasmine Pang M.D.   On: 11/28/2015 04:38   ZOX:[id number] rhythm, RBBB, rate 63  TELEMETRY: sinus rhythm, monomorphic VT with ATP delivered which degenerated into VF followed by ICD shock   DEVICE HISTORY: MDT single chamber ICD implanted 2004; gen change 2014  Assessment/Plan: 1.   Sustained ventricular tachycardia with appropriate ICD therapy The patient has had sustained VT with appropriate ICD therapy.  VT rate was hovering around detection which resulted in delay of initial ATP delivery.  Second attempt at ATP accelerated VT into VF and he was appropriately shocked into SR.  VT cycle length around .  Device reprogrammed today to lower VT detection to allow for more appropriate detection.  ATP also made slightly more aggressive as per Dr [name]. Will start Mexiletine 150mg  tid for VT suppression.  No driving  Keep K >5.4, Mg >0.9  2.  Chronic systolic heart failure Continue medical therapy Agree with AHF consult  3.  Pneumonia/?amio toxicity Management per pulmonary   4.  CAD Stable CAD by cath 09/2015  5. Sinus bradycardia with Mobitz I heart block With amiodarone in the past, he developed increased RV pacing RV pacing percentage on today's device interrogation 0.4% but conduction system disease will limit ability to use BB  6.  Persistent atrial fibrillation Currently maintaining SR Continue Eliquis for CHADS2VASC of  3 He has had prior GI bleed, CBC stable this admission    Signed, Gypsy Balsam, NP 11/29/2015 10:38 AM  EP Attending  Patient well known to me who presents for treatment of worsening sob, pneumonia and has had VT in the hospital which has quickly degenerated into VF. He has been shocked back to NSR. The patient appears to have developed amio lung toxicity and his amio has been stopped. He has had worsening CHF over the past couple of years. He has multiple other medical problems as outlined  above. Would consider transfer to ICU if has has more shocks. Would start mexitil but would switch to IV lidocaine if he has more VT.  Leonia Reeves.D.

## 2015-11-29 NOTE — Progress Notes (Signed)
DAILY PROGRESS NOTE  Subjective:  Breathing a little better overnight. Amiodarone held - given steroids and diuresed about 1L negative. This am had about 3 minutes of slow sustained monomorphic VT which accelerated into the treatment zone of his ICD - he had 2 ATP attempts, the second attempt did put him in VF for which he was appropriately shocked by the device. He has no recollection of the event and was found to be posturing and hypoxic on arrival of the nursing and rapid response staff. Echo is pending.  Objective:  Temp:  [97.9 F (36.6 C)-98 F (36.7 C)] 98 F (36.7 C) (10/25 0506) Pulse Rate:  [57-66] 66 (10/25 0506) Resp:  [18] 18 (10/25 0506) BP: (100-126)/(61-79) 100/61 (10/25 0506) SpO2:  [90 %-96 %] 90 % (10/25 0506) FiO2 (%):  [40 %] 40 % (10/24 1201) Weight:  [140 lb 6.4 oz (63.7 kg)] 140 lb 6.4 oz (63.7 kg) (10/25 0506) Weight change: 1 lb 14.4 oz (0.862 kg)  Intake/Output from previous day: 10/24 0701 - 10/25 0700 In: 850 [P.O.:600; IV Piggyback:250] Out: 5361 [Urine:1650]  Intake/Output from this shift: Total I/O In: 240 [P.O.:240] Out: -   Medications: Current Facility-Administered Medications on File Prior to Encounter  Medication Dose Route Frequency Provider Last Rate Last Dose  . sodium chloride 0.9 % injection 3 mL  3 mL Intravenous Q12H Evans Lance, MD      . sodium chloride 0.9 % injection 3 mL  3 mL Intravenous PRN Evans Lance, MD       Current Outpatient Prescriptions on File Prior to Encounter  Medication Sig Dispense Refill  . apixaban (ELIQUIS) 5 MG TABS tablet Take 1 tablet (5 mg total) by mouth 2 (two) times daily. Resume 09/29/15 60 tablet 6  . aspirin 81 MG EC tablet Take 1 tablet (81 mg total) by mouth daily.    . benzonatate (TESSALON) 200 MG capsule TAKE 1 CAPSULE BY MOUTH 3 TIMES DAILY AS NEEDED FOR COUGH (Patient taking differently: TAKE 1 CAPSULE (200 mg) BY MOUTH 3 TIMES DAILY AS NEEDED FOR COUGH) 30 capsule 1  . diazepam  (VALIUM) 5 MG tablet TAKE 1 TABLET BY MOUTH EVERY 6 HOURS AS NEEDED (Patient taking differently: TAKE 1 TABLET BY MOUTH EVERY 6 HOURS AS NEEDED FOR ANXIETY) 45 tablet 0  . donepezil (ARICEPT) 10 MG tablet Take 1 tablet (10 mg total) by mouth at bedtime. 90 tablet 1  . guaiFENesin-dextromethorphan (ROBITUSSIN DM) 100-10 MG/5ML syrup Take 5 mLs by mouth every 4 (four) hours as needed for cough. 118 mL 0  . HYDROcodone-acetaminophen (NORCO/VICODIN) 5-325 MG tablet TAKE 1 TABLET BY MOUTH EVERY 6 HOURS AS NEEDED FOR PAIN (Patient taking differently: Take 1 tablet by mouth every 6 (six) hours as needed for moderate pain. ) 30 tablet 0  . levalbuterol (XOPENEX) 1.25 MG/0.5ML nebulizer solution Take 1.25 mg by nebulization every 6 (six) hours as needed for wheezing or shortness of breath. 1 each 12  . levofloxacin (LEVAQUIN) 750 MG tablet Take 1 tablet (750 mg total) by mouth daily. 7 tablet 0  . loperamide (IMODIUM) 2 MG capsule Take 1 capsule (2 mg total) by mouth as needed for diarrhea or loose stools. 30 capsule 0  . metaxalone (SKELAXIN) 800 MG tablet TAKE 1 TABLET BY MOUTH 3 TIMES A DAY AS NEEDED FOR PAIN. (Patient taking differently: Take 800 mg by mouth 3 (three) times daily as needed for muscle spasms. ) 90 tablet 1  . Multiple Vitamins-Minerals (  MULTIVITAMIN ADULTS 50+) TABS Take 1 tablet by mouth daily.    . nitroGLYCERIN (NITROSTAT) 0.4 MG SL tablet PLACE 1 TABLET (0.4 MG TOTAL) UNDER THE TONGUE EVERY 5 (FIVE) MINUTES AS NEEDED. FOR CHEST PAIN. 25 tablet 1  . promethazine (PHENERGAN) 25 MG tablet Take 0.5-1 tablets (12.5-25 mg total) by mouth every 8 (eight) hours as needed for nausea or vomiting. 30 tablet 0  . ranitidine (ZANTAC) 150 MG capsule Take 150 mg by mouth daily as needed for heartburn.     . ranolazine (RANEXA) 500 MG 12 hr tablet Take 1 tablet (500 mg total) by mouth 2 (two) times daily. 60 tablet 1  . traMADol (ULTRAM) 50 MG tablet TAKE 1 TO 2 TABLETS BY MOUTH EVERY 8 HOURS AS NEEDED  (Patient taking differently: TAKE 1 TO 2 TABLETS BY MOUTH EVERY 8 HOURS AS NEEDED FOR PAIN.) 100 tablet 0  . zolpidem (AMBIEN CR) 12.5 MG CR tablet TAKE 1 TABLET BY MOUTH AT BEDTIME (Patient taking differently: TAKE 1 TABLET BY MOUTH AT BEDTIME AS NEEDED FOR SLEEP) 30 tablet 5  . amiodarone (PACERONE) 200 MG tablet Take 200 mg by mouth Mon-Fri and 400 mg on Sat and Sun. Please start taking once completed therapy with Levaquin on October 25th, 2017. (Patient not taking: Reported on 11/28/2015)  0    Physical Exam: General appearance: alert, mild distress and on a ventimask Neck: JVD - 3 cm above sternal notch and no carotid bruit Lungs: diminished breath sounds bilaterally and rales LLL, LUL and RLL Heart: regular rate and rhythm, S1, S2 normal and S3 present Abdomen: soft, mildly distended, +AJr Extremities: extremities normal, atraumatic, no cyanosis or edema Pulses: 1+ distal pulses Skin: pale, dry, warm Neurologic: Mental status: Alert, oriented, thought content appropriate Psych: In good spirits, given what just happened to him  Lab Results: Results for orders placed or performed during the hospital encounter of 11/28/15 (from the past 48 hour(s))  Comprehensive metabolic panel     Status: Abnormal   Collection Time: 11/28/15  2:58 AM  Result Value Ref Range   Sodium 140 135 - 145 mmol/L   Potassium 4.1 3.5 - 5.1 mmol/L   Chloride 109 101 - 111 mmol/L   CO2 25 22 - 32 mmol/L   Glucose, Bld 121 (H) 65 - 99 mg/dL   BUN 17 6 - 20 mg/dL   Creatinine, Ser 0.98 0.61 - 1.24 mg/dL   Calcium 7.4 (L) 8.9 - 10.3 mg/dL   Total Protein 5.0 (L) 6.5 - 8.1 g/dL   Albumin 2.0 (L) 3.5 - 5.0 g/dL   AST 109 (H) 15 - 41 U/L   ALT 80 (H) 17 - 63 U/L   Alkaline Phosphatase 76 38 - 126 U/L   Total Bilirubin 1.1 0.3 - 1.2 mg/dL   GFR calc non Af Amer >60 >60 mL/min   GFR calc Af Amer >60 >60 mL/min    Comment: (NOTE) The eGFR has been calculated using the CKD EPI equation. This calculation has not  been validated in all clinical situations. eGFR's persistently <60 mL/min signify possible Chronic Kidney Disease.    Anion gap 6 5 - 15  Troponin I     Status: Abnormal   Collection Time: 11/28/15  2:58 AM  Result Value Ref Range   Troponin I 0.05 (HH) <0.03 ng/mL    Comment: CRITICAL RESULT CALLED TO, READ BACK BY AND VERIFIED WITH: KNUCKLES,C RN 11/28/2015 0343 JORDANS   CBC with Differential  Status: Abnormal   Collection Time: 11/28/15  2:58 AM  Result Value Ref Range   WBC 10.0 4.0 - 10.5 K/uL   RBC 3.52 (L) 4.22 - 5.81 MIL/uL   Hemoglobin 9.7 (L) 13.0 - 17.0 g/dL   HCT 30.8 (L) 39.0 - 52.0 %   MCV 87.5 78.0 - 100.0 fL   MCH 27.6 26.0 - 34.0 pg   MCHC 31.5 30.0 - 36.0 g/dL   RDW 14.9 11.5 - 15.5 %   Platelets 411 (H) 150 - 400 K/uL   Neutrophils Relative % 80 %   Neutro Abs 8.0 (H) 1.7 - 7.7 K/uL   Lymphocytes Relative 8 %   Lymphs Abs 0.8 0.7 - 4.0 K/uL   Monocytes Relative 10 %   Monocytes Absolute 1.0 0.1 - 1.0 K/uL   Eosinophils Relative 2 %   Eosinophils Absolute 0.2 0.0 - 0.7 K/uL   Basophils Relative 0 %   Basophils Absolute 0.0 0.0 - 0.1 K/uL  I-Stat CG4 Lactic Acid, ED     Status: None   Collection Time: 11/28/15  3:01 AM  Result Value Ref Range   Lactic Acid, Venous 1.14 0.5 - 1.9 mmol/L  Brain natriuretic peptide     Status: Abnormal   Collection Time: 11/28/15  7:37 AM  Result Value Ref Range   B Natriuretic Peptide 494.0 (H) 0.0 - 100.0 pg/mL  Sedimentation rate     Status: Abnormal   Collection Time: 11/28/15  7:37 AM  Result Value Ref Range   Sed Rate 40 (H) 0 - 16 mm/hr  C-reactive protein     Status: Abnormal   Collection Time: 11/28/15  7:37 AM  Result Value Ref Range   CRP 10.9 (H) <1.0 mg/dL  Procalcitonin - Baseline     Status: None   Collection Time: 11/28/15  7:37 AM  Result Value Ref Range   Procalcitonin <0.10 ng/mL    Comment:        Interpretation: PCT (Procalcitonin) <= 0.5 ng/mL: Systemic infection (sepsis) is not  likely. Local bacterial infection is possible. (NOTE)         ICU PCT Algorithm               Non ICU PCT Algorithm    ----------------------------     ------------------------------         PCT < 0.25 ng/mL                 PCT < 0.1 ng/mL     Stopping of antibiotics            Stopping of antibiotics       strongly encouraged.               strongly encouraged.    ----------------------------     ------------------------------       PCT level decrease by               PCT < 0.25 ng/mL       >= 80% from peak PCT       OR PCT 0.25 - 0.5 ng/mL          Stopping of antibiotics                                             encouraged.     Stopping of antibiotics  encouraged.    ----------------------------     ------------------------------       PCT level decrease by              PCT >= 0.25 ng/mL       < 80% from peak PCT        AND PCT >= 0.5 ng/mL            Continuin g antibiotics                                              encouraged.       Continuing antibiotics            encouraged.    ----------------------------     ------------------------------     PCT level increase compared          PCT > 0.5 ng/mL         with peak PCT AND          PCT >= 0.5 ng/mL             Escalation of antibiotics                                          strongly encouraged.      Escalation of antibiotics        strongly encouraged.   Magnesium     Status: None   Collection Time: 11/28/15  8:00 AM  Result Value Ref Range   Magnesium 1.9 1.7 - 2.4 mg/dL  Phosphorus     Status: None   Collection Time: 11/28/15  8:00 AM  Result Value Ref Range   Phosphorus 2.5 2.5 - 4.6 mg/dL  TSH     Status: Abnormal   Collection Time: 11/28/15  8:00 AM  Result Value Ref Range   TSH 0.035 (L) 0.350 - 4.500 uIU/mL    Comment: Performed by a 3rd Generation assay with a functional sensitivity of <=0.01 uIU/mL.  Blood gas, arterial     Status: Abnormal   Collection Time: 11/28/15  8:53 AM  Result Value  Ref Range   FIO2 45.00    Delivery systems VENTURI MASK    pH, Arterial 7.433 7.350 - 7.450   pCO2 arterial 37.3 32.0 - 48.0 mmHg   pO2, Arterial 77.1 (L) 83.0 - 108.0 mmHg   Bicarbonate 24.5 20.0 - 28.0 mmol/L   Acid-Base Excess 0.7 0.0 - 2.0 mmol/L   O2 Saturation 95.2 %   Patient temperature 98.6    Collection site LEFT RADIAL    Drawn by [id number]    Sample type ARTERIAL DRAW    Allens test (pass/fail) PASS PASS  Troponin I (q 6hr x 3)     Status: Abnormal   Collection Time: 11/28/15 10:18 AM  Result Value Ref Range   Troponin I 0.04 (HH) <0.03 ng/mL    Comment: CRITICAL VALUE NOTED.  VALUE IS CONSISTENT WITH PREVIOUSLY REPORTED AND CALLED VALUE.  Vitamin B12     Status: None   Collection Time: 11/28/15 10:18 AM  Result Value Ref Range   Vitamin B-12 450 180 - 914 pg/mL    Comment: (NOTE) This assay is not validated for testing neonatal or myeloproliferative syndrome specimens for Vitamin B12 levels.   Folate  Status: None   Collection Time: 11/28/15 10:18 AM  Result Value Ref Range   Folate 34.9 >5.9 ng/mL  Iron and TIBC     Status: Abnormal   Collection Time: 11/28/15 10:18 AM  Result Value Ref Range   Iron 26 (L) 45 - 182 ug/dL   TIBC 206 (L) 250 - 450 ug/dL   Saturation Ratios 13 (L) 17.9 - 39.5 %   UIBC 180 ug/dL  Ferritin     Status: None   Collection Time: 11/28/15 10:18 AM  Result Value Ref Range   Ferritin 312 24 - 336 ng/mL  Reticulocytes     Status: Abnormal   Collection Time: 11/28/15 10:18 AM  Result Value Ref Range   Retic Ct Pct 2.5 0.4 - 3.1 %   RBC. 3.53 (L) 4.22 - 5.81 MIL/uL   Retic Count, Manual 88.3 19.0 - 186.0 K/uL  Urinalysis, Routine w reflex microscopic (not at [facility])     Status: Abnormal   Collection Time: 11/28/15  2:27 PM  Result Value Ref Range   Color, Urine ORANGE (A) YELLOW    Comment: BIOCHEMICALS MAY BE AFFECTED BY COLOR   APPearance CLOUDY (A) CLEAR   Specific Gravity, Urine 1.030 1.005 - 1.030   pH 5.5 5.0 - 8.0    Glucose, UA NEGATIVE NEGATIVE mg/dL   Hgb urine dipstick NEGATIVE NEGATIVE   Bilirubin Urine SMALL (A) NEGATIVE   Ketones, ur NEGATIVE NEGATIVE mg/dL   Protein, ur 30 (A) NEGATIVE mg/dL   Nitrite POSITIVE (A) NEGATIVE   Leukocytes, UA SMALL (A) NEGATIVE  Urine microscopic-add on     Status: Abnormal   Collection Time: 11/28/15  2:27 PM  Result Value Ref Range   Squamous Epithelial / LPF 0-5 (A) NONE SEEN   WBC, UA 0-5 0 - 5 WBC/hpf   RBC / HPF NONE SEEN 0 - 5 RBC/hpf   Bacteria, UA MANY (A) NONE SEEN   Casts HYALINE CASTS (A) NEGATIVE  C3 complement     Status: Abnormal   Collection Time: 11/28/15  2:33 PM  Result Value Ref Range   C3 Complement 78 (L) 82 - 167 mg/dL    Comment: (NOTE) Performed At: [facility] [address] [place], [place] [id number] Lindon Romp MD [id number]   C4 complement     Status: Abnormal   Collection Time: 11/28/15  2:33 PM  Result Value Ref Range   Complement C4, Body Fluid 9 (L) 14 - 44 mg/dL    Comment: (NOTE) Performed At: [facility] [address] [place], [place] [id number] Lindon Romp MD [id number]   Complement, total     Status: None   Collection Time: 11/28/15  2:33 PM  Result Value Ref Range   Compl, Total (CH50) 47 42 - 60 U/mL    Comment: (NOTE) Performed At: [facility] [place], [place] [id number] Lindon Romp MD [id number]   Rheumatoid factor     Status: None   Collection Time: 11/28/15  2:33 PM  Result Value Ref Range   Rhuematoid fact SerPl-aCnc 12.1 0.0 - 13.9 IU/mL    Comment: (NOTE) Performed At: [facility] [place], [place] [id number] Lindon Romp MD [id number]   Strep pneumoniae urinary antigen     Status: None   Collection Time: 11/28/15  5:05 PM  Result Value Ref Range   Strep Pneumo Urinary Antigen NEGATIVE NEGATIVE    Comment:        Infection due  to S. pneumoniae cannot be absolutely ruled out since the antigen  present may be below the detection limit of the test.   T4, free     Status: Abnormal   Collection Time: 11/29/15 12:12 AM  Result Value Ref Range   Free T4 2.06 (H) 0.61 - 1.12 ng/dL    Comment: (NOTE) Biotin ingestion may interfere with free T4 tests. If the results are inconsistent with the TSH level, previous test results, or the clinical presentation, then consider biotin interference. If needed, order repeat testing after stopping biotin.   Comprehensive metabolic panel     Status: Abnormal   Collection Time: 11/29/15 12:12 AM  Result Value Ref Range   Sodium 137 135 - 145 mmol/L   Potassium 4.2 3.5 - 5.1 mmol/L   Chloride 104 101 - 111 mmol/L   CO2 25 22 - 32 mmol/L   Glucose, Bld 178 (H) 65 - 99 mg/dL   BUN 14 6 - 20 mg/dL   Creatinine, Ser 0.92 0.61 - 1.24 mg/dL   Calcium 7.6 (L) 8.9 - 10.3 mg/dL   Total Protein 5.3 (L) 6.5 - 8.1 g/dL   Albumin 2.0 (L) 3.5 - 5.0 g/dL   AST 91 (H) 15 - 41 U/L   ALT 79 (H) 17 - 63 U/L   Alkaline Phosphatase 80 38 - 126 U/L   Total Bilirubin 1.0 0.3 - 1.2 mg/dL   GFR calc non Af Amer >60 >60 mL/min   GFR calc Af Amer >60 >60 mL/min    Comment: (NOTE) The eGFR has been calculated using the CKD EPI equation. This calculation has not been validated in all clinical situations. eGFR's persistently <60 mL/min signify possible Chronic Kidney Disease.    Anion gap 8 5 - 15  CBC     Status: Abnormal   Collection Time: 11/29/15 12:12 AM  Result Value Ref Range   WBC 9.2 4.0 - 10.5 K/uL   RBC 3.71 (L) 4.22 - 5.81 MIL/uL   Hemoglobin 10.2 (L) 13.0 - 17.0 g/dL   HCT 32.7 (L) 39.0 - 52.0 %   MCV 88.1 78.0 - 100.0 fL   MCH 27.5 26.0 - 34.0 pg   MCHC 31.2 30.0 - 36.0 g/dL   RDW 15.1 11.5 - 15.5 %   Platelets 475 (H) 150 - 400 K/uL  Glucose, capillary     Status: Abnormal   Collection Time: 11/29/15  9:15 AM  Result Value Ref Range   Glucose-Capillary 176 (H) 65 - 99 mg/dL    Imaging: Dg Chest 2 View  Result Date:  11/28/2015 CLINICAL DATA:  Shortness of breath EXAM: CHEST  2 VIEW COMPARISON:  11/24/2015, 03/06/2011, CT 11/17/2015 FINDINGS: Left-sided single lead ICD. Lead placement similar compared to previous. There is cardiomegaly. Interval increase in interstitial opacities asymmetric in the left thorax. Slight increased interstitial opacities within the right lung base. Atherosclerosis of the aorta. No pneumothorax. IMPRESSION: 1. Increased interstitial and alveolar opacities within the left greater then right chest since the previous exam. 2. Stable degree of cardiomegaly. Electronically Signed   By: Donavan Foil M.D.   On: 11/28/2015 04:38   Ct Head Wo Contrast  Result Date: 11/28/2015 CLINICAL DATA:  Unresponsive, seizure like activity EXAM: CT HEAD WITHOUT CONTRAST TECHNIQUE: Contiguous axial images were obtained from the base of the skull through the vertex without intravenous contrast. COMPARISON:  10/13/2015 FINDINGS: Brain: No acute territorial infarction or hemorrhage. No focal mass, mass effect or midline shift. Mild to moderate cortical atrophy.  Moderate periventricular and subcortical white matter small vessel ischemic changes. Ventricles are similar in size and configuration. Vascular: No hyperdense vessels. Calcifications within the carotid arteries at the skullbase. Skull: No fracture.  Mastoid air cells clear. Sinuses/Orbits: Minimal mucosal thickening in the paranasal sinuses. No acute orbital abnormality. Other: None IMPRESSION: 1. No CT evidence for acute intracranial abnormality. 2. Moderate atrophy. Moderate periventricular and subcortical white matter small vessel ischemic disease. Electronically Signed   By: Donavan Foil M.D.   On: 11/28/2015 04:35   Dg Chest Port 1 View  Result Date: 11/29/2015 CLINICAL DATA:  Dyspnea EXAM: PORTABLE CHEST 1 VIEW COMPARISON:  Chest radiograph from one day prior. FINDINGS: Stable configuration of single lead left subclavian ICD. Stable cardiomediastinal  silhouette with mild cardiomegaly and aortic atherosclerosis. No pneumothorax. No pleural effusion. Re- demonstrated are extensive patchy and reticular opacities throughout both lungs, asymmetrically involving the left upper lung, not appreciably changed. IMPRESSION: Stable chest radiograph. Stable cardiomegaly and extensive patchy and reticular opacities throughout both lungs, asymmetrically involving the left upper lung, for which the differential includes asymmetric pulmonary edema and/or pneumonia, with underlying interstitial lung disease not excluded. Electronically Signed   By: Ilona Sorrel M.D.   On: 11/29/2015 09:52    Assessment:  1. Principal Problem: 2.   Acute on chronic respiratory failure with hypoxia (HCC) 3. Active Problems: 4.   Hyperlipidemia 5.   COPD (chronic obstructive pulmonary disease) ([place]) 6.   Chronic systolic CHF (congestive heart failure) ([place]) 7.   Ischemic cardiomyopathy 8.   History of ventricular tachycardia 9.   Chronic atrial fibrillation (HCC) 10.   Memory loss 11.   HCAP (healthcare-associated pneumonia) 24.   AICD (automatic cardioverter/defibrillator) present 13.   Physical deconditioning 14.   Transaminitis 15.   Anemia 16.   Elevated troponin 17.   Hypoxia 18.   Plan:  1. Diuresed overnight with positive response - was not on home diuretics. Continue lasix 40 IV BID today. Sustained monomorphic VT this morning - appropriate ATP and shock for VF. Amiodarone discontinued yesterday - will ask EP to evaluate for this, given history of VT storm. Await echocardiogram - LVEF 25% by cath this past July - could not find old echo report, but suspect there is biventricular failure. Will ask Dr. Haroldine Laws and the advanced CHF service to evaluate.  Time Spent Directly with Patient:  15 minutes  Length of Stay:  LOS: 1 day   Pixie Casino, MD, [facility] Attending Cardiologist [place] 11/29/2015, 9:40 AM

## 2015-11-29 NOTE — Progress Notes (Signed)
Initial Nutrition Assessment  DOCUMENTATION CODES:   Non-severe (moderate) malnutrition in context of chronic illness  INTERVENTION:  Provide Boost Breeze po TID, each supplement provides 250 kcal and 9 grams of protein Encourage PO intake Multivitamin with minerals daily   NUTRITION DIAGNOSIS:   Malnutrition related to chronic illness as evidenced by moderate depletion of body fat, moderate depletions of muscle mass, percent weight loss.   GOAL:   Patient will meet greater than or equal to 90% of their needs   MONITOR:   PO intake, Supplement acceptance, Labs, Weight trends, Skin, I & O's  REASON FOR ASSESSMENT:   Consult (Verbal Consult from RN) Poor PO  ASSESSMENT:   70 year old male with extensive cardiac history and several recent admissions for HCAP which he failed several out patient antibody treatments.  Returns 10/24 with SOB, hypoxia and worsening diffuse bilateral infiltrates.  Pt eating lunch at time of visit, eating bites between taking on and off Venturi mask. He reports eating poorly for the past month due to early satiety, emesis, and diarrhea. He report being admitted 3 times in the past month. He has been eating about 50% of meals since admission, and meals ordered are rather small. Per weight history, pt has lost 7.3% of his body weight within the past 2 months. He has moderate muscle wasting and moderate fat wasting per nutrition-focused physical exam. RD encouraged increased PO intake with snacks and/or nutritional supplements. Pt agreeable to receiving Boost Breeze.   Labs: low hemoglobin, low calcium  Diet Order:  Diet Heart Room service appropriate? Yes; Fluid consistency: Thin  Skin:  Reviewed, no issues  Last BM:  10/24  Height:   Ht Readings from Last 1 Encounters:  11/28/15 5\' 3"  (1.6 m)    Weight:   Wt Readings from Last 1 Encounters:  11/29/15 140 lb 6.4 oz (63.7 kg)    Ideal Body Weight:  56.4 kg  BMI:  Body mass index is 24.87  kg/m.  Estimated Nutritional Needs:   Kcal:  1700-1900  Protein:  90-100 grams  Fluid:  1.9 L/day  EDUCATION NEEDS:   No education needs identified at this time  Dorothea Ogle RD, CSP, LDN Inpatient Clinical Dietitian Pager: [phone] After Hours Pager: [phone]

## 2015-11-29 NOTE — Progress Notes (Signed)
Name: [name] MRN: [id number] DOB: [birth year]    ADMISSION DATE:  11/28/2015 CONSULTATION DATE:  11/28/2015  REFERRING MD :  Hal Hope TRH  CHIEF COMPLAINT:  Hypoxia, diffuse infiltrates  BRIEF PATIENT DESCRIPTION: 70 year old male with extensive cardiac history and several recent admissions for HCAP which he failed several out patient antibody treatments.  Returns 10/24 with SOB, hypoxia and worsening diffuse bilateral infiltrates.  SIGNIFICANT EVENTS  8/23 > admit for AICD firing 10/2 > admit for HCAP 10/13 >  Admit for HCAP 10/24 admit for worsening airspace disease and persistent hypoxia.  STUDIES:  8/24 LHC > Prox RCA lesion 100%, Mid cx 70%. EF 25% estimated. Continue medical therapy, no PCI. CT chest 10/13 > Multi focal infiltrate involving the lower lobes, left greater than right, and the left upper lobe is most likely an infectious or inflammatory process. Recommend short-term follow-up to ensure resolution. 4 mm nodule in the right upper lobe. Recommend attention on short-term follow-up. Echo 10/24 >   SUBJECTIVE:  3 min run VT this am.  ICD shocked.  Dr. Debara Pickett, pacemaker rep, RRT RN at bedside.   Pt stable post shock. Became minimally responsive during VT, now awake, alert, appropriate.   VITAL SIGNS: Temp:  [97.9 F (36.6 C)-98.1 F (36.7 C)] 98.1 F (36.7 C) (10/25 0954) Pulse Rate:  [57-73] 73 (10/25 0954) Resp:  [18-20] 20 (10/25 0954) BP: (100-126)/(58-79) 101/58 (10/25 0954) SpO2:  [90 %-96 %] 90 % (10/25 0954) FiO2 (%):  [40 %] 40 % (10/24 1201) Weight:  [63.7 kg (140 lb 6.4 oz)] 63.7 kg (140 lb 6.4 oz) (10/25 0506)  PHYSICAL EXAMINATION: General:  Thin elderly male in NAD on venti mast Neuro:  Alert, oriented, appropriate, MAE  HEENT: [place]/AT, PERRL, no JVD Cardiovascular:  RRR, no MRG Lungs:  resps even non labored on venti mask, crackles R>L  Abdomen: Soft non-tender, non-distended Musculoskeletal:  No acute deformity or ROM  limitation Skin:  Grossly intact   Recent Labs Lab 11/28/15 0258 11/29/15 0012  NA 140 137  K 4.1 4.2  CL 109 104  CO2 25 25  BUN 17 14  CREATININE 0.98 0.92  GLUCOSE 121* 178*    Recent Labs Lab 11/28/15 0258 11/29/15 0012  HGB 9.7* 10.2*  HCT 30.8* 32.7*  WBC 10.0 9.2  PLT 411* 475*   Dg Chest 2 View  Result Date: 11/28/2015 CLINICAL DATA:  Shortness of breath EXAM: CHEST  2 VIEW COMPARISON:  11/24/2015, 03/06/2011, CT 11/17/2015 FINDINGS: Left-sided single lead ICD. Lead placement similar compared to previous. There is cardiomegaly. Interval increase in interstitial opacities asymmetric in the left thorax. Slight increased interstitial opacities within the right lung base. Atherosclerosis of the aorta. No pneumothorax. IMPRESSION: 1. Increased interstitial and alveolar opacities within the left greater then right chest since the previous exam. 2. Stable degree of cardiomegaly. Electronically Signed   By: Donavan Foil M.D.   On: 11/28/2015 04:38   Ct Head Wo Contrast  Result Date: 11/28/2015 CLINICAL DATA:  Unresponsive, seizure like activity EXAM: CT HEAD WITHOUT CONTRAST TECHNIQUE: Contiguous axial images were obtained from the base of the skull through the vertex without intravenous contrast. COMPARISON:  10/13/2015 FINDINGS: Brain: No acute territorial infarction or hemorrhage. No focal mass, mass effect or midline shift. Mild to moderate cortical atrophy. Moderate periventricular and subcortical white matter small vessel ischemic changes. Ventricles are similar in size and configuration. Vascular: No hyperdense vessels. Calcifications within the carotid arteries at the skullbase. Skull: No fracture.  Mastoid air cells clear. Sinuses/Orbits: Minimal mucosal thickening in the paranasal sinuses. No acute orbital abnormality. Other: None IMPRESSION: 1. No CT evidence for acute intracranial abnormality. 2. Moderate atrophy. Moderate periventricular and subcortical white matter  small vessel ischemic disease. Electronically Signed   By: Donavan Foil M.D.   On: 11/28/2015 04:35   Dg Chest Port 1 View  Result Date: 11/29/2015 CLINICAL DATA:  Dyspnea EXAM: PORTABLE CHEST 1 VIEW COMPARISON:  Chest radiograph from one day prior. FINDINGS: Stable configuration of single lead left subclavian ICD. Stable cardiomediastinal silhouette with mild cardiomegaly and aortic atherosclerosis. No pneumothorax. No pleural effusion. Re- demonstrated are extensive patchy and reticular opacities throughout both lungs, asymmetrically involving the left upper lung, not appreciably changed. IMPRESSION: Stable chest radiograph. Stable cardiomegaly and extensive patchy and reticular opacities throughout both lungs, asymmetrically involving the left upper lung, for which the differential includes asymmetric pulmonary edema and/or pneumonia, with underlying interstitial lung disease not excluded. Electronically Signed   By: Ilona Sorrel M.D.   On: 11/29/2015 09:52    ASSESSMENT / PLAN:  Hypoxia secondary to diffuse pulmonary infiltrates -- likely NON-infectious etiology.  Suspect decompensated/worsening heart failure +/- amiodarone toxicity.  Autoimmune w/u pending.   Plan: Supplemental O2 to keep sats > 90% Diuresis per cards  Amiodarone has been stopped  Continue empiric steroids --solumedrol 20m BID ANA, ESR, CRP, Double stranded DNA, C3, C4, CH50, ANCA, Rheumatoid factor all pending   F/u pct - if neg would STOP all abx  If no improvement may need to progress to open lung biopsy   Acute on chronic systolic CHF  Ischemic cardiomyopathy  AFib  VT   Plan -  Per cards  Repeat echo pending  EP and advanced heart failure team to see    KNickolas Madrid NP 11/29/2015  10:08 AM Pager: [phone] or [phone]

## 2015-11-29 NOTE — Progress Notes (Signed)
RN entered room because central monitor showed HR in 150s. When RN and NT entered room, patient alert and oriented and in no distress, slightly diaphoretic. Patient then jolted in bed and went unresponsive. Called for help. Pt was unresponsive for approximately 30 seconds. Other staff entered room, MD made aware. Rapid Response RN called. Pt had no recollection of event, and was at 71% on venti-mask. He was placed on non-rebreather mask. New orders placed and carried out. Patient resting comfortably in bed, wife at bedside. Cardiology aware. Will continue to monitor.

## 2015-11-29 NOTE — Progress Notes (Signed)
PT Cancellation Note  Patient Details Name: [name] MRN: [id number] DOB: [birth year]   Cancelled Treatment:    Reason Eval/Treat Not Completed: Patient not medically ready. Pt with episode of VT to VF this AM requiring shock from his ICD. He is now on venturi mask. PT to follow up tomorrow.   Ilda Foil 11/29/2015, 10:55 AM

## 2015-11-29 NOTE — Telephone Encounter (Signed)
Tanya with [facility] left v/m requesting verbal orders for home health nurse and PT to do eval and treat. Pt will be discharged from [facility] on 12/01/15 or 12/02/15;pt had COPD and Pneumonia. Tanya request cb.

## 2015-11-29 NOTE — Significant Event (Signed)
Rapid Response Event Note  Overview: Time Called: 0913 Arrival Time: 0917 Event Type: Cardiac  Initial Focused Assessment: Upon review of Telemetry: Patient with 3 min episode of VT.  Initially rate of 160 then increased to 190s,  ICD attempted to pace then shocked patient into brady rhythm. Per RN she was in the room with him and he was initially alert and oriented then suddenly decompensated, and started to recover after ICD shock.  O2 sats 70s on venti mask.  RT placed patient on 100% NRB. Upon my arrival patient alert and oriented in no distress.  SR with 1st degree heart block. Patient on 100% NRB o2 sats 100% BP 105/64  RR 20   Lung sounds clear   Interventions: Weaned O2 to 50% venti O2 sats 92% BP 101/58 1st degree heart block 73 RR 20 Dr Rennis Golden at bedside [facility] done ICD interrogated  Katie NP (CCM) at bedside for consult Wife arrived and updated on recent events and patient status.  Plan of Care (if not transferred): RN to call if assistance needed  Event Summary: Name of Physician Notified: Arrien at bedside at    Name of Consulting Physician Notified: Dr Rennis Golden at 0930  Outcome: Stayed in room and stabalized  Event End Time: 1000  Marcellina Millin

## 2015-11-29 NOTE — Progress Notes (Signed)
OT Cancellation Note  Patient Details Name: [name] MRN: [id number] DOB: [birth year]   Cancelled Treatment:    Reason Eval/Treat Not Completed: Medical issues which prohibited therapy. Pt with episode of VT to VF this a.m. Now on venturi mask. Will follow.  Evern Bio 11/29/2015, 11:04 AM  [phone]

## 2015-11-29 NOTE — Consult Note (Signed)
   [facility] CM Inpatient Consult   11/29/2015  [name] 23-Oct-1945 [id number]  Met with the patient and his wife and they confirm ongoing [facility] services.  Active consent on file and verified the phone numbers are active.  Patient endorses Dr. Elsie Stain as his primary care provider.  No other needs expressed.  He will likely discharge with home health care per wife.  Explained again that [facility] does not interfere with any services arranged by the inpatient care management staff.  For questions, please contact:  Natividad Brood, RN BSN [facility] Liaison  [phone] business mobile phone Toll free office [phone]

## 2015-11-29 NOTE — Telephone Encounter (Signed)
Please give the order.  Thanks.   

## 2015-11-29 NOTE — Progress Notes (Signed)
(  late entry).  Upon walking on unit, RN asked for me to go check on 3e23, RN in room x 2.  Noted pt was awake, talking and in no resp distress, however sat 75% on VM.  Per RN, pt "just passed out". Placed pt on NRB, sat increased to 100%, pt denies SOB.  [facility] clear.  MD initially ordered bipap, but MD decided pt did not need bipap at this time.    Pt placed back on VM, sat 95-98%, no distress noted still, pt denies SOB currently.  [facility] still clear.

## 2015-11-29 NOTE — Progress Notes (Signed)
Pt experienced VT again resulting in shock. BP 65/36(45). Pt unconscious right before shock. Rate >200 bpm. Joice Lofts S NP with EP paged. Verbal orders given for Mexitil 150mg  once, Mg 2g. Called back by [facility] after discussing w/  Dr. [name]. Orders given for Lidocaine gtt. Pt now A&O, HR 74 first degree HB, BP 98/68(79), SpO2 93% on venti mask 40%. Will continue to monitor and assess pt closely.

## 2015-11-30 ENCOUNTER — Other Ambulatory Visit (HOSPITAL_COMMUNITY): Payer: Medicare Other

## 2015-11-30 ENCOUNTER — Inpatient Hospital Stay (HOSPITAL_COMMUNITY): Payer: Medicare Other

## 2015-11-30 DIAGNOSIS — R0600 Dyspnea, unspecified: Secondary | ICD-10-CM

## 2015-11-30 DIAGNOSIS — I5023 Acute on chronic systolic (congestive) heart failure: Secondary | ICD-10-CM

## 2015-11-30 LAB — ECHOCARDIOGRAM COMPLETE
HEIGHTINCHES: 63 in
WEIGHTICAEL: 2248 [oz_av]

## 2015-11-30 LAB — CBC WITH DIFFERENTIAL/PLATELET
BASOS ABS: 0 10*3/uL (ref 0.0–0.1)
BASOS PCT: 0 %
EOS PCT: 0 %
Eosinophils Absolute: 0 10*3/uL (ref 0.0–0.7)
HCT: 32.1 % — ABNORMAL LOW (ref 39.0–52.0)
Hemoglobin: 10.3 g/dL — ABNORMAL LOW (ref 13.0–17.0)
Lymphocytes Relative: 3 %
Lymphs Abs: 0.5 10*3/uL — ABNORMAL LOW (ref 0.7–4.0)
MCH: 27.9 pg (ref 26.0–34.0)
MCHC: 32.1 g/dL (ref 30.0–36.0)
MCV: 87 fL (ref 78.0–100.0)
MONO ABS: 0.5 10*3/uL (ref 0.1–1.0)
Monocytes Relative: 4 %
NEUTROS ABS: 13.2 10*3/uL — AB (ref 1.7–7.7)
Neutrophils Relative %: 93 %
Platelets: 502 10*3/uL — ABNORMAL HIGH (ref 150–400)
RBC: 3.69 MIL/uL — AB (ref 4.22–5.81)
RDW: 15.3 % (ref 11.5–15.5)
WBC: 14.2 10*3/uL — AB (ref 4.0–10.5)

## 2015-11-30 LAB — BASIC METABOLIC PANEL
ANION GAP: 8 (ref 5–15)
BUN: 24 mg/dL — ABNORMAL HIGH (ref 6–20)
CALCIUM: 7.4 mg/dL — AB (ref 8.9–10.3)
CO2: 27 mmol/L (ref 22–32)
Chloride: 101 mmol/L (ref 101–111)
Creatinine, Ser: 0.98 mg/dL (ref 0.61–1.24)
GFR calc Af Amer: >60 mL/min (ref >60)
GLUCOSE: 168 mg/dL — AB (ref 65–99)
POTASSIUM: 3.6 mmol/L (ref 3.5–5.1)
SODIUM: 136 mmol/L (ref 135–145)

## 2015-11-30 LAB — COOXEMETRY PANEL
Carboxyhemoglobin: 1.2 % (ref 0.5–1.5)
METHEMOGLOBIN: 1.5 % (ref 0.0–1.5)
O2 Saturation: 66.3 %
Total hemoglobin: 10.2 g/dL — ABNORMAL LOW (ref 12.0–16.0)

## 2015-11-30 LAB — CYCLIC CITRUL PEPTIDE ANTIBODY, IGG/IGA: CCP ANTIBODIES IGG/IGA: 19 U (ref 0–19)

## 2015-11-30 LAB — PROCALCITONIN

## 2015-11-30 LAB — T3: T3, Total: 56 ng/dL — ABNORMAL LOW (ref 71–180)

## 2015-11-30 LAB — ANCA TITERS

## 2015-11-30 LAB — MAGNESIUM: MAGNESIUM: 2.4 mg/dL (ref 1.7–2.4)

## 2015-11-30 MED ORDER — POTASSIUM CHLORIDE CRYS ER 20 MEQ PO TBCR
20.0000 meq | EXTENDED_RELEASE_TABLET | Freq: Two times a day (BID) | ORAL | Status: DC
  Administered 2015-11-30: 20 meq via ORAL
  Filled 2015-11-30: qty 1

## 2015-11-30 MED ORDER — ONDANSETRON HCL 4 MG/2ML IJ SOLN
4.0000 mg | INTRAMUSCULAR | Status: DC
  Administered 2015-11-30 – 2015-12-04 (×11): 4 mg via INTRAVENOUS
  Filled 2015-11-30 (×14): qty 2

## 2015-11-30 MED ORDER — MEXILETINE HCL 200 MG PO CAPS
200.0000 mg | ORAL_CAPSULE | Freq: Three times a day (TID) | ORAL | Status: DC
  Administered 2015-11-30 – 2015-12-06 (×18): 200 mg via ORAL
  Filled 2015-11-30 (×19): qty 1

## 2015-11-30 MED ORDER — POTASSIUM CHLORIDE CRYS ER 20 MEQ PO TBCR
40.0000 meq | EXTENDED_RELEASE_TABLET | Freq: Once | ORAL | Status: AC
  Administered 2015-11-30: 40 meq via ORAL
  Filled 2015-11-30: qty 2

## 2015-11-30 MED ORDER — SODIUM CHLORIDE 0.9% FLUSH: 10.0000 mL | INTRAVENOUS | Status: DC | PRN

## 2015-11-30 MED ORDER — ONDANSETRON HCL 4 MG/2ML IJ SOLN: 4.0000 mg | Freq: Four times a day (QID) | INTRAMUSCULAR | Status: DC | PRN

## 2015-11-30 MED ORDER — SODIUM CHLORIDE 0.9% FLUSH
10.0000 mL | Freq: Two times a day (BID) | INTRAVENOUS | Status: DC
  Administered 2015-11-30 (×2): 10 mL
  Administered 2015-12-01: 30 mL
  Administered 2015-12-01 – 2015-12-03 (×5): 10 mL
  Administered 2015-12-04: 20 mL
  Administered 2015-12-04 – 2015-12-06 (×4): 10 mL

## 2015-11-30 MED ORDER — PERFLUTREN LIPID MICROSPHERE
1.0000 mL | INTRAVENOUS | Status: AC | PRN
  Administered 2015-11-30: 2 mL via INTRAVENOUS
  Filled 2015-11-30: qty 10

## 2015-11-30 MED ORDER — PERFLUTREN LIPID MICROSPHERE
INTRAVENOUS | Status: AC
  Filled 2015-11-30: qty 10

## 2015-11-30 NOTE — Progress Notes (Signed)
Events reviewed yesterday afternoon - now on cardiac EP service. D/W Dr. Gala Romney and he will consult today from advanced CHF service. I have again contacted echo which has been pending to be performed for 2 days.  Chrystie Nose, MD, [facility] Attending Cardiologist [facility]

## 2015-11-30 NOTE — Progress Notes (Signed)
RT note- Patient working with PT, sitting on the side of bed, became nauseated and vomited. sp02 78%, placed on NRB 100%, sp02 now 97%. Continue to monitor.

## 2015-11-30 NOTE — Progress Notes (Signed)
  Echocardiogram 2D Echocardiogram has been performed.  [name] 11/30/2015, 9:28 AM

## 2015-11-30 NOTE — Progress Notes (Signed)
PROGRESS NOTE    [name]  RPR:[id number] DOB: 04/15/1945 DOA: 11/28/2015 PCP: Crawford Givens, MD    Brief Narrative:  70 yo male with systolic ischemic cardiomyopathy ef 25%, presented with dyspnea and syncope. Admitted with working diagnosis of pulmonary edema to rule out pneumonia. Developed VT and VF with syncope on the medical ward. 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 induced lung injury.    Assessment & Plan:   Principal Problem:   Acute on chronic respiratory failure with hypoxia (HCC) Active Problems:   Hyperlipidemia   COPD (chronic obstructive pulmonary disease) (HCC)   Chronic systolic CHF (congestive heart failure) (HCC)   Ischemic cardiomyopathy   History of ventricular tachycardia   Chronic atrial fibrillation (HCC)   Memory loss   HCAP (healthcare-associated pneumonia)   AICD (automatic cardioverter/defibrillator) present   Physical deconditioning   Transaminitis   Anemia   Elevated troponin   Hypoxia   Malnutrition of moderate degree   Opacity of lung on imaging study    1. Syncope.  Continue telemetry monitoring. Out of bed as tolerated, physical therapy evaluation.  2. VT/VF. Will continue telemetry monitoring, started on mexiletin. Patient was placed on lidocaine drip overnight. Echocardiogram with left ventricle ejection fraction 25 to 30%, with inferior and inferior lateral akinesis.    3. Systolic ischemic heart failure. Continue furosemide 40 mg bid, target negative fluid balance, urine output 1750 over last 24 hours. Blood pressure 97 to 100 systolic.   4. Atrial fibrillation. Will continue anticoagulation with apixban. Heart rate in the 60's  5. Possible pneumonia. Will continue broad spectrum antibiotic therapy with cefepime, cultures no growth. Chest film with worsening infiltrate on the left upper lobe. Continue xopenex.  7. Hypoxic respiratory failure. Patient with persistent hypoxemia with desaturation  below 80's on room air, will continue oxymetry monitoring, will attempt high flow nasal cannula, will place patient on bipap at night to prevent worsening respiratory failure. Will continue on systemic steroids, follow pulmonary recommendations.   8. Anxiety. Will continue diazepam as needed. Positive episodes of anxiety, high risk for benzodiazepine withdrawal.   9. Hypokalemia. Will continue repletion with kcl, follow on renal panel in am.   10. Sick euthyroid syndrome/ amiodarone toxicity. TSH low normal with elevated free T4.   DVT prophylaxis: apixaban  Code Status: full  Family Communication: No family at the bedside   Disposition Plan:   Consultants:   Cardiology  Electrophysiology  Pulmonary   Antibiotics:              Cefepime #1            Subjective: Patient transferred to step down unit, has been on venti- mask unable to wean down to nasal cannula. This am positive for confusion, denies chest pain, dyspnea, nausea or vomiting. Placed on lidocaine drip.   Objective: Vitals:   11/30/15 0412 11/30/15 0500 11/30/15 0513 11/30/15 0600  BP: 102/81 110/69  114/71  Pulse: 64 65  66  Resp: (!) 33 19  19  Temp: (!) 96.1 F (35.6 C)     TempSrc: Axillary     SpO2: 94% 90%  90%  Weight:   63.7 kg (140 lb 8 oz)   Height:        Intake/Output Summary (Last 24 hours) at 11/30/15 0751 Last data filed at 11/30/15 0600  Gross per 24 hour  Intake             1745 ml  Output  1750 ml  Net               -5 ml   Filed Weights   11/28/15 0642 11/29/15 0506 11/30/15 0513  Weight: 62.8 kg (138 lb 8 oz) 63.7 kg (140 lb 6.4 oz) 63.7 kg (140 lb 8 oz)    Examination:  General exam: ill looking appearing E ENT: mild conjunctival pallor, oral mucosa dry. Respiratory system:  Bilateral scattered rales, more on the left, no wheezing or rhonchi. Mild use of accesory muscles.  Cardiovascular system: S1 & S2 heard, RRR. No JVD,rubs, gallops or clicks. No pedal  edema. Gastrointestinal system: Abdomen is nondistended, soft and nontender. No organomegaly or masses felt. Normal bowel sounds heard. Central nervous system: Alert and oriented. No focal neurological deficits. Extremities: Symmetric 5 x 5 power. Skin: No rashes, lesions or ulcers     Data Reviewed: I have personally reviewed following labs and imaging studies  CBC:  Recent Labs Lab 11/28/15 0258 11/29/15 0012 11/30/15 0440  WBC 10.0 9.2 14.2*  NEUTROABS 8.0*  --  13.2*  HGB 9.7* 10.2* 10.3*  HCT 30.8* 32.7* 32.1*  MCV 87.5 88.1 87.0  PLT 411* 475* 502*   Basic Metabolic Panel:  Recent Labs Lab 11/28/15 0258 11/28/15 0800 11/29/15 0012 11/29/15 1430 11/30/15 0440  NA 140  --  137  --  136  K 4.1  --  4.2  --  3.6  CL 109  --  104  --  101  CO2 25  --  25  --  27  GLUCOSE 121*  --  178*  --  168*  BUN 17  --  14  --  24*  CREATININE 0.98  --  0.92  --  0.98  CALCIUM 7.4*  --  7.6*  --  7.4*  MG  --  1.9  --  1.8  --   PHOS  --  2.5  --   --   --    GFR: Estimated Creatinine Clearance: 55.6 mL/min (by C-G formula based on SCr of 0.98 mg/dL). Liver Function Tests:  Recent Labs Lab 11/28/15 0258 11/29/15 0012  AST 109* 91*  ALT 80* 79*  ALKPHOS 76 80  BILITOT 1.1 1.0  PROT 5.0* 5.3*  ALBUMIN 2.0* 2.0*   No results for input(s): LIPASE, AMYLASE in the last 168 hours. No results for input(s): AMMONIA in the last 168 hours. Coagulation Profile: No results for input(s): INR, PROTIME in the last 168 hours. Cardiac Enzymes:  Recent Labs Lab 11/28/15 0258 11/28/15 1018  TROPONINI 0.05* 0.04*   BNP (last 3 results) No results for input(s): PROBNP in the last 8760 hours. HbA1C: No results for input(s): HGBA1C in the last 72 hours. CBG:  Recent Labs Lab 11/29/15 0915  GLUCAP 176*   Lipid Profile: No results for input(s): CHOL, HDL, LDLCALC, TRIG, CHOLHDL, LDLDIRECT in the last 72 hours. Thyroid Function Tests:  Recent Labs  11/28/15 0800  11/29/15 0012  TSH 0.035*  --   FREET4  --  2.06*   Anemia Panel:  Recent Labs  11/28/15 1018  VITAMINB12 450  FOLATE 34.9  FERRITIN 312  TIBC 206*  IRON 26*  RETICCTPCT 2.5   Sepsis Labs:  Recent Labs Lab 11/28/15 0301 11/28/15 0737  PROCALCITON  --  <0.10  LATICACIDVEN 1.14  --     Recent Results (from the past 240 hour(s))  MRSA PCR Screening     Status: None   Collection Time: 11/29/15  2:35  PM  Result Value Ref Range Status   MRSA by PCR NEGATIVE NEGATIVE Final    Comment:        The GeneXpert MRSA Assay (FDA approved for NASAL specimens only), is one component of a comprehensive MRSA colonization surveillance program. It is not intended to diagnose MRSA infection nor to guide or monitor treatment for MRSA infections.          Radiology Studies: Dg Chest Port 1 View  Result Date: 11/29/2015 CLINICAL DATA:  Dyspnea EXAM: PORTABLE CHEST 1 VIEW COMPARISON:  Chest radiograph from one day prior. FINDINGS: Stable configuration of single lead left subclavian ICD. Stable cardiomediastinal silhouette with mild cardiomegaly and aortic atherosclerosis. No pneumothorax. No pleural effusion. Re- demonstrated are extensive patchy and reticular opacities throughout both lungs, asymmetrically involving the left upper lung, not appreciably changed. IMPRESSION: Stable chest radiograph. Stable cardiomegaly and extensive patchy and reticular opacities throughout both lungs, asymmetrically involving the left upper lung, for which the differential includes asymmetric pulmonary edema and/or pneumonia, with underlying interstitial lung disease not excluded. Electronically Signed   By: Delbert PhenixJason A Poff M.D.   On: 11/29/2015 09:52        Scheduled Meds: . apixaban  5 mg Oral BID  . aspirin EC  81 mg Oral Daily  . ceFEPime (MAXIPIME) IV  1 g Intravenous Q8H  . donepezil  10 mg Oral QHS  . famotidine  20 mg Oral Daily  . feeding supplement  1 Container Oral TID BM  .  furosemide  40 mg Intravenous BID  . guaiFENesin  600 mg Oral BID  . loratadine  10 mg Oral Daily  . methylPREDNISolone (SOLU-MEDROL) injection  40 mg Intravenous BID  . mexiletine  150 mg Oral Q8H  . multivitamin with minerals  1 tablet Oral Daily  . ranolazine  500 mg Oral BID  . sodium chloride flush  3 mL Intravenous Q12H  . vancomycin  500 mg Intravenous Q12H   Continuous Infusions: . lidocaine 2 mg/min (11/30/15 0402)     LOS: 2 days        Vallorie Niccoli Annett Gulaaniel Janelle Culton, MD [facility] Pager [phone]  If 7PM-7AM, please contact night-coverage [email or web address] Password [email or web address] 11/30/2015, 7:51 AM

## 2015-11-30 NOTE — Progress Notes (Signed)
Advanced Heart Failure Rounding Note  PCP: Crawford Givens, MD Primary Cardiologist/EP: Dr. [name]   Subjective:    Admitted 11/28/15 with a/c respiratory failure. Had VT with shock 11/29/15. EP following.   Currently on Mexitil. Remains on Lidocaine gtt for the time being.  EP to see later today. Per wife his affect is more flat and he is slightly nonsensical today.  States he had something "glorious" to tell her, but then would not tell her, stating he may be able to tell the chaplain.  Also stares off into distance occasionally and told her to stop asking him about it.   Feels like he's "not getting much oxygen".  02 sats 96%. Does feel better with deep breathing.  He denies overt SOB. No palpitations or CP. Got up this am had nearly passed out. Nauseated and weak.   Negative 755 cc yesterday. Weight stable. Creatinine 0.98. K 3.6. Mg 2.4  Objective:   Weight Range: 140 lb 8 oz (63.7 kg) Body mass index is 24.89 kg/m.   Vital Signs:   Temp:  [96.1 F (35.6 C)-97.9 F (36.6 C)] 96.6 F (35.9 C) (10/26 0755) Pulse Rate:  [61-77] 71 (10/26 0755) Resp:  [17-33] 26 (10/26 0755) BP: (65-134)/(36-93) 97/80 (10/26 0755) SpO2:  [89 %-99 %] 96 % (10/26 0755) FiO2 (%):  [40 %-65 %] 65 % (10/26 0500) Weight:  [140 lb 8 oz (63.7 kg)] 140 lb 8 oz (63.7 kg) (10/26 0513) Last BM Date: 11/28/15  Weight change: Filed Weights   11/28/15 0642 11/29/15 0506 11/30/15 0513  Weight: 138 lb 8 oz (62.8 kg) 140 lb 6.4 oz (63.7 kg) 140 lb 8 oz (63.7 kg)    Intake/Output:   Intake/Output Summary (Last 24 hours) at 11/30/15 1016 Last data filed at 11/30/15 0600  Gross per 24 hour  Intake             1505 ml  Output             1750 ml  Net             -245 ml     Physical Exam: General:  Chronically ill and fatigued. Falls asleep during interview HEENT: Normal x for temporal wasting  Neck: supple. JVP ~8 cm. Carotids 2+ bilat; no bruits. No thyromegaly or nodule noted.  Cor: PMI  nondisplaced. Regular. +s3 Lungs: Diminished throughout with dependent crackles   Abdomen: soft, NT, ND, no HSM. No bruits or masses. +BS  Extremities: no cyanosis, clubbing, rash. No peripheral edema Neuro: alert & orientedx3, cranial nerves grossly intact. moves all 4 extremities w/o difficulty. Affect flat.   Telemetry: Reviewed, NSR 60s  Labs: CBC  Recent Labs  11/28/15 0258 11/29/15 0012 11/30/15 0440  WBC 10.0 9.2 14.2*  NEUTROABS 8.0*  --  13.2*  HGB 9.7* 10.2* 10.3*  HCT 30.8* 32.7* 32.1*  MCV 87.5 88.1 87.0  PLT 411* 475* 502*   Basic Metabolic Panel  Recent Labs  11/28/15 0800 11/29/15 0012 11/29/15 1430 11/30/15 0440 11/30/15 0810  NA  --  137  --  136  --   K  --  4.2  --  3.6  --   CL  --  104  --  101  --   CO2  --  25  --  27  --   GLUCOSE  --  178*  --  168*  --   BUN  --  14  --  24*  --  CREATININE  --  0.92  --  0.98  --   CALCIUM  --  7.6*  --  7.4*  --   MG 1.9  --  1.8  --  2.4  PHOS 2.5  --   --   --   --    Liver Function Tests  Recent Labs  11/28/15 0258 11/29/15 0012  AST 109* 91*  ALT 80* 79*  ALKPHOS 76 80  BILITOT 1.1 1.0  PROT 5.0* 5.3*  ALBUMIN 2.0* 2.0*   No results for input(s): LIPASE, AMYLASE in the last 72 hours. Cardiac Enzymes  Recent Labs  11/28/15 0258 11/28/15 1018  TROPONINI 0.05* 0.04*    BNP: BNP (last 3 results)  Recent Labs  09/27/15 0658 11/07/15 0253 11/28/15 0737  BNP 184.8* 184.2* 494.0*    ProBNP (last 3 results) No results for input(s): PROBNP in the last 8760 hours.   D-Dimer No results for input(s): DDIMER in the last 72 hours. Hemoglobin A1C No results for input(s): HGBA1C in the last 72 hours. Fasting Lipid Panel No results for input(s): CHOL, HDL, LDLCALC, TRIG, CHOLHDL, LDLDIRECT in the last 72 hours. Thyroid Function Tests  Recent Labs  11/28/15 0800  TSH 0.035*    Other results:     Imaging/Studies:  Dg Chest Port 1 View  Result Date:  11/30/2015 CLINICAL DATA:  Lethargy and difficulty breathing EXAM: PORTABLE CHEST 1 VIEW COMPARISON:  11/29/2015 FINDINGS: Cardiac shadow remains enlarged. A defibrillator is again seen and stable. Diffuse infiltrative changes are noted throughout both lungs left greater than right but stable from the prior exam. No new focal abnormality is noted. No bony abnormality is seen. IMPRESSION: Stable bilateral infiltrates left greater than right. Electronically Signed   By: Alcide CleverMark  Lukens M.D.   On: 11/30/2015 09:41   Dg Chest Port 1 View  Result Date: 11/29/2015 CLINICAL DATA:  Dyspnea EXAM: PORTABLE CHEST 1 VIEW COMPARISON:  Chest radiograph from one day prior. FINDINGS: Stable configuration of single lead left subclavian ICD. Stable cardiomediastinal silhouette with mild cardiomegaly and aortic atherosclerosis. No pneumothorax. No pleural effusion. Re- demonstrated are extensive patchy and reticular opacities throughout both lungs, asymmetrically involving the left upper lung, not appreciably changed. IMPRESSION: Stable chest radiograph. Stable cardiomegaly and extensive patchy and reticular opacities throughout both lungs, asymmetrically involving the left upper lung, for which the differential includes asymmetric pulmonary edema and/or pneumonia, with underlying interstitial lung disease not excluded. Electronically Signed   By: Delbert PhenixJason A Poff M.D.   On: 11/29/2015 09:52    Latest Echo   Latest Cath   Medications:     Scheduled Medications: . apixaban  5 mg Oral BID  . aspirin EC  81 mg Oral Daily  . ceFEPime (MAXIPIME) IV  1 g Intravenous Q8H  . donepezil  10 mg Oral QHS  . famotidine  20 mg Oral Daily  . feeding supplement  1 Container Oral TID BM  . furosemide  40 mg Intravenous BID  . guaiFENesin  600 mg Oral BID  . loratadine  10 mg Oral Daily  . methylPREDNISolone (SOLU-MEDROL) injection  40 mg Intravenous BID  . mexiletine  150 mg Oral Q8H  . multivitamin with minerals  1 tablet Oral  Daily  . potassium chloride  40 mEq Oral Once   Followed by  . potassium chloride  20 mEq Oral BID  . ranolazine  500 mg Oral BID  . sodium chloride flush  3 mL Intravenous Q12H    Infusions: .  lidocaine 2 mg/min (11/30/15 0402)    PRN Medications: acetaminophen **OR** acetaminophen, benzonatate, diazepam, HYDROcodone-acetaminophen, levalbuterol, loperamide, metaxalone, perflutren lipid microspheres (DEFINITY) IV suspension, promethazine, traMADol, zolpidem   Assessment/Plan   1. Acute on chronic systolic HF - Most recent EF 25% by cath 09/28/15. Echo pending.  - Volume status relatively stable. CVP ~ 8 cm. - Will continue IV lasix 40 mg BID for now. He was not on home diuretics - Repeat Echo pending concerns for biventricular failure.  2. VT - s/p Medtronic ICD with Optivol.  - EP following. On Lidocaine and Mexitil currently. - Mg stable. Supp K. Will aim to keep Mg > 2.0, goal K > 4.0.  3. HCAP - ABX per pulm.  - Stopping Vanc. Remains of Cefepime.  4. CAD - LHC 09/28/15 with chronic total occlusion of the proximal RCA and moderate disease in Distal LCx and third OM.    5. Confusion  - ? Component of lidocaine toxicity with alternating symptoms of confusion, agitation, lethargy, impending doom + euphoria.  May also be component of ICU delirium.   Length of Stay: 2  Graciella Freer PA-C 11/30/2015, 10:16 AM  Advanced Heart Failure Team Pager [phone] (M-F; 7a - 4p)  Please contact [facility] for night-coverage after hours (4p -7a ) and weekends on [email or web address]  Patient seen and examined with Otilio Saber, PA-C. We discussed all aspects of the encounter. I agree with the assessment and plan as stated above.   I have reviewed echo personally. EF ~25%. Degree of LV dysfunction far outweighs recent cath findings. Now on mexilitene and lido for recurrent VT. Based on exam and clinical course over past few months I worry he has low output HF with possible  electromechanical VT. Will place PICC line and check co-ox and CVP. I suspect he may need inotropic support and/or consideration of advanced therapies.   The patient is critically ill with multiple organ systems failure and requires high complexity decision making for assessment and support, frequent evaluation and titration of therapies, application of advanced monitoring technologies and extensive interpretation of multiple databases.   Critical Care Time devoted to patient care services described in this note is 35 Minutes.  Ezrael Sam,MD 1:34 PM

## 2015-11-30 NOTE — Progress Notes (Signed)
Peripherally Inserted Central Catheter/Midline Placement  The IV Nurse has discussed with the patient and/or persons authorized to consent for the patient, the purpose of this procedure and the potential benefits and risks involved with this procedure.  The benefits include less needle sticks, lab draws from the catheter, and the patient may be discharged home with the catheter. Risks include, but not limited to, infection, bleeding, blood clot (thrombus formation), and puncture of an artery; nerve damage and irregular heartbeat and possibility to perform a PICC exchange if needed/ordered by physician.  Alternatives to this procedure were also discussed.  Bard Power PICC patient education guide, fact sheet on infection prevention and patient information card has been provided to patient /or left at bedside.    PICC/Midline Placement Documentation     Conssent signed by wife at [facility] per patient request   [name] 11/30/2015, 3:15 PM

## 2015-11-30 NOTE — Progress Notes (Signed)
Patient Name: [name] Date of Encounter: 11/30/2015     Principal Problem:   Acute on chronic respiratory failure with hypoxia (HCC) Active Problems:   Hyperlipidemia   COPD (chronic obstructive pulmonary disease) (HCC)   Acute on chronic systolic CHF (congestive heart failure) (HCC)   Ischemic cardiomyopathy   History of ventricular tachycardia   Chronic atrial fibrillation (HCC)   Memory loss   HCAP (healthcare-associated pneumonia)   AICD (automatic cardioverter/defibrillator) present   Physical deconditioning   Transaminitis   Anemia   Elevated troponin   Hypoxia   Malnutrition of moderate degree   Opacity of lung on imaging study    SUBJECTIVE  Not very responsive. Level of consciousness appears down. Note choreaform movements.   CURRENT MEDS . apixaban  5 mg Oral BID  . aspirin EC  81 mg Oral Daily  . ceFEPime (MAXIPIME) IV  1 g Intravenous Q8H  . donepezil  10 mg Oral QHS  . famotidine  20 mg Oral Daily  . feeding supplement  1 Container Oral TID BM  . furosemide  40 mg Intravenous BID  . guaiFENesin  600 mg Oral BID  . loratadine  10 mg Oral Daily  . methylPREDNISolone (SOLU-MEDROL) injection  40 mg Intravenous BID  . mexiletine  150 mg Oral Q8H  . multivitamin with minerals  1 tablet Oral Daily  . ondansetron (ZOFRAN) IV  4 mg Intravenous Q4H  . potassium chloride  20 mEq Oral BID  . ranolazine  500 mg Oral BID  . sodium chloride flush  3 mL Intravenous Q12H    OBJECTIVE  Vitals:   11/30/15 1215 11/30/15 1300 11/30/15 1330 11/30/15 1400  BP: 112/68 110/69 103/77 101/65  Pulse: 63 60 61 60  Resp: 19 14 19 19   Temp: 97.3 F (36.3 C)     TempSrc: Axillary     SpO2: 100% 99% 94% 92%  Weight:      Height:        Intake/Output Summary (Last 24 hours) at 11/30/15 1443 Last data filed at 11/30/15 1435  Gross per 24 hour  Intake           2432.5 ml  Output             2150 ml  Net            282.5 ml   Filed Weights   11/28/15  0642 11/29/15 0506 11/30/15 0513  Weight: 138 lb 8 oz (62.8 kg) 140 lb 6.4 oz (63.7 kg) 140 lb 8 oz (63.7 kg)    PHYSICAL EXAM  General: Pleasant, decreased level of consciousness NAD. Neuro: Alert but sleepy. Moves all extremities spontaneously. Psych: blunted affect. HEENT:  Normal  Neck: Supple without bruits or JVD. Lungs:  Resp regular and unlabored, CTA. Heart: RRR no s3, s4, or murmurs. Abdomen: Soft, non-tender, non-distended, BS + x 4.  Extremities: No clubbing, cyanosis or edema. DP/PT/Radials 2+ and equal bilaterally.  Accessory Clinical Findings  CBC  Recent Labs  11/28/15 0258 11/29/15 0012 11/30/15 0440  WBC 10.0 9.2 14.2*  NEUTROABS 8.0*  --  13.2*  HGB 9.7* 10.2* 10.3*  HCT 30.8* 32.7* 32.1*  MCV 87.5 88.1 87.0  PLT 411* 475* 502*   Basic Metabolic Panel  Recent Labs  11/28/15 0800 11/29/15 0012 11/29/15 1430 11/30/15 0440 11/30/15 0810  NA  --  137  --  136  --   K  --  4.2  --  3.6  --  CL  --  104  --  101  --   CO2  --  25  --  27  --   GLUCOSE  --  178*  --  168*  --   BUN  --  14  --  24*  --   CREATININE  --  0.92  --  0.98  --   CALCIUM  --  7.6*  --  7.4*  --   MG 1.9  --  1.8  --  2.4  PHOS 2.5  --   --   --   --    Liver Function Tests  Recent Labs  11/28/15 0258 11/29/15 0012  AST 109* 91*  ALT 80* 79*  ALKPHOS 76 80  BILITOT 1.1 1.0  PROT 5.0* 5.3*  ALBUMIN 2.0* 2.0*   No results for input(s): LIPASE, AMYLASE in the last 72 hours. Cardiac Enzymes  Recent Labs  11/28/15 0258 11/28/15 1018  TROPONINI 0.05* 0.04*   BNP Invalid input(s): POCBNP D-Dimer No results for input(s): DDIMER in the last 72 hours. Hemoglobin A1C No results for input(s): HGBA1C in the last 72 hours. Fasting Lipid Panel No results for input(s): CHOL, HDL, LDLCALC, TRIG, CHOLHDL, LDLDIRECT in the last 72 hours. Thyroid Function Tests  Recent Labs  11/28/15 0800  TSH 0.035*    TELE  Nsr, no VT overnight  Radiology/Studies  Dg  Chest 2 View  Result Date: 11/28/2015 CLINICAL DATA:  Shortness of breath EXAM: CHEST  2 VIEW COMPARISON:  11/24/2015, 03/06/2011, CT 11/17/2015 FINDINGS: Left-sided single lead ICD. Lead placement similar compared to previous. There is cardiomegaly. Interval increase in interstitial opacities asymmetric in the left thorax. Slight increased interstitial opacities within the right lung base. Atherosclerosis of the aorta. No pneumothorax. IMPRESSION: 1. Increased interstitial and alveolar opacities within the left greater then right chest since the previous exam. 2. Stable degree of cardiomegaly. Electronically Signed   By: Jasmine Pang M.D.   On: 11/28/2015 04:38   Dg Chest 2 View  Result Date: 11/24/2015 CLINICAL DATA:  Follow up pneumonia. Shortness of breath. Quit smoking 2 months ago. EXAM: CHEST  2 VIEW COMPARISON:  11/21/2015 and 11/17/2015 as well as CT 11/17/2015 FINDINGS: Left-sided pacemaker unchanged. Lungs are adequately inflated demonstrate persistent mixed interstitial airspace opacification over the left lung with slight interval improved aeration of the upper lobe. No evidence of effusion stable cardiomegaly. Interval improvement with mild residual prominence of the perihilar markings. Remainder of the exam is unchanged. IMPRESSION: Slight interval improvement of mixed interstitial airspace process involving the left lung likely improving infection. Interval improvement mild perihilar prominence. Stable cardiomegaly. Electronically Signed   By: Elberta Fortis M.D.   On: 11/24/2015 13:36   Dg Chest 2 View  Result Date: 11/21/2015 CLINICAL DATA:  Dyspnea EXAM: CHEST  2 VIEW COMPARISON:  11/17/2015 FINDINGS: Radiographically progressed diffuse left lung opacity. Emphysematous spaces seen on prior chest CT. No cavitation or effusion. Chronic cardiopericardial enlargement. Single chamber ICD/ pacer from the left. No effusion or pneumothorax. IMPRESSION: 1. Progressed left lung pneumonia or  pneumonitis. 2. Emphysema. 3. Cardiomegaly. Electronically Signed   By: Marnee Spring M.D.   On: 11/21/2015 11:30   Dg Chest 2 View  Result Date: 11/17/2015 CLINICAL DATA:  Chest pain.  Recent pneumonia. EXAM: CHEST  2 VIEW COMPARISON:  11/06/2015 FINDINGS: Pacer/AICD device. Midline trachea. Mild cardiomegaly. No pleural effusion or pneumothorax. Diffuse interstitial thickening. Persistent left lower lobe and developing left upper lobe airspace disease. IMPRESSION: Persistent  left lower and developing left upper lobe airspace disease. This is superimposed upon cardiomegaly and diffuse interstitial prominence. Favor progressive pneumonia. Asymmetric pulmonary edema could look similar. Electronically Signed   By: Jeronimo Greaves M.D.   On: 11/17/2015 13:25   Dg Chest 2 View  Result Date: 11/06/2015 CLINICAL DATA:  Hypoxia, vomiting and diarrhea last night EXAM: CHEST  2 VIEW COMPARISON:  09/27/2015 and dating back through 11/12/2012 FINDINGS: The heart is enlarged. AICD device projects over the left hemithorax with single lead projecting in the expected location of the right ventricle. Left lower lobe airspace disease suspicious for pneumonia is noted with small loculated posterior pleural effusion. No acute osseous abnormality. Cholecystectomy clips present in the right upper quadrant. IMPRESSION: New left lower lobe infiltrate with small posterior loculated pleural effusion. Electronically Signed   By: Tollie Eth M.D.   On: 11/06/2015 20:00   Ct Head Wo Contrast  Result Date: 11/28/2015 CLINICAL DATA:  Unresponsive, seizure like activity EXAM: CT HEAD WITHOUT CONTRAST TECHNIQUE: Contiguous axial images were obtained from the base of the skull through the vertex without intravenous contrast. COMPARISON:  10/13/2015 FINDINGS: Brain: No acute territorial infarction or hemorrhage. No focal mass, mass effect or midline shift. Mild to moderate cortical atrophy. Moderate periventricular and subcortical white  matter small vessel ischemic changes. Ventricles are similar in size and configuration. Vascular: No hyperdense vessels. Calcifications within the carotid arteries at the skullbase. Skull: No fracture.  Mastoid air cells clear. Sinuses/Orbits: Minimal mucosal thickening in the paranasal sinuses. No acute orbital abnormality. Other: None IMPRESSION: 1. No CT evidence for acute intracranial abnormality. 2. Moderate atrophy. Moderate periventricular and subcortical white matter small vessel ischemic disease. Electronically Signed   By: Jasmine Pang M.D.   On: 11/28/2015 04:35   Ct Chest Wo Contrast  Result Date: 11/17/2015 CLINICAL DATA:  Hypoxia.  Former smoker. EXAM: CT CHEST WITHOUT CONTRAST TECHNIQUE: Multidetector CT imaging of the chest was performed following the standard protocol without IV contrast. COMPARISON:  CT chest August 02, 2008 and recent chest x-rays. FINDINGS: Cardiovascular: A pacemaker device is seen. There are coronary artery calcifications on the left. Cardiomegaly is noted. No effusions. Mediastinum/Nodes: Small nodules in the thyroid of doubtful significance. The thoracic aorta is normal in caliber. It is also mildly tortuous with minimal atherosclerotic change. The central pulmonary arteries are normal. The visualized esophagus is unremarkable. A calcified node is seen in the right hilum on image 31 of series 2. A few calcified right hilar nodes are seen as well. A single mildly prominent right paratracheal node measures 12 mm likely reactive. A few other shotty nodes are identified. Lungs/Pleura: Mild opacity is seen in the right lung base. Opacity is also seen in the left upper lobe, primarily medially. More focal opacity is seen in the left lung base with a rounded region of opacity seen on axial image 90, series 5. Mild emphysematous changes. Tiny irregular nodule in the right upper lobe on series 5, image 53 measuring 4 mm. Evaluation for nodules is otherwise limited due the  pulmonary opacities but no other nodules or masses are noted. Upper Abdomen: Previous cholecystectomy. No other acute abnormalities in the upper abdomen. Musculoskeletal: Degenerative changes in the spine. No other acute bony abnormalities. IMPRESSION: 1. Multi focal infiltrate involving the lower lobes, left greater than right, and the left upper lobe is most likely an infectious or inflammatory process. Recommend short-term follow-up to ensure resolution. 2. 4 mm nodule in the right upper lobe. Recommend attention  on short-term follow-up. Electronically Signed   By: Gerome Samavid  Williams III M.D   On: 11/17/2015 19:28   Dg Chest Port 1 View  Result Date: 11/30/2015 CLINICAL DATA:  Lethargy and difficulty breathing EXAM: PORTABLE CHEST 1 VIEW COMPARISON:  11/29/2015 FINDINGS: Cardiac shadow remains enlarged. A defibrillator is again seen and stable. Diffuse infiltrative changes are noted throughout both lungs left greater than right but stable from the prior exam. No new focal abnormality is noted. No bony abnormality is seen. IMPRESSION: Stable bilateral infiltrates left greater than right. Electronically Signed   By: Alcide CleverMark  Lukens M.D.   On: 11/30/2015 09:41   Dg Chest Port 1 View  Result Date: 11/29/2015 CLINICAL DATA:  Dyspnea EXAM: PORTABLE CHEST 1 VIEW COMPARISON:  Chest radiograph from one day prior. FINDINGS: Stable configuration of single lead left subclavian ICD. Stable cardiomediastinal silhouette with mild cardiomegaly and aortic atherosclerosis. No pneumothorax. No pleural effusion. Re- demonstrated are extensive patchy and reticular opacities throughout both lungs, asymmetrically involving the left upper lung, not appreciably changed. IMPRESSION: Stable chest radiograph. Stable cardiomegaly and extensive patchy and reticular opacities throughout both lungs, asymmetrically involving the left upper lung, for which the differential includes asymmetric pulmonary edema and/or pneumonia, with underlying  interstitial lung disease not excluded. Electronically Signed   By: Delbert PhenixJason A Poff M.D.   On: 11/29/2015 09:52    ASSESSMENT AND PLAN  1. VT - he has settled down but concerned about lidocaine toxicity. Will stop lidocaine and increase mexitil 2. Acute on chronic systolic heart failure - clinically over the past year his VT has worsened as his CHF has worsened. I suspect he may need ionotropes though this could make his VT worse. 3. Atypical pneumonia - I share pulmonary critical care concern about amiodarone induced lung toxicitiy. Amiodarone has been stopped. I will defer thoughts on whether steroid therapy to CCM service. 4. Altered mentation - maybe be multifactorial but I suspect lidocaine is playing a role. It has been stopped.  5. Disposition - I remain concerned about his long term prognosis being poor but agree with plans to treat aggressively, at least for a few more days.   Sharlot GowdaGregg Taylor,M.D.  11/30/2015 2:43 PMPatient ID: [name], male   DOB: Feb 17, 1945, 70 y.o.   MRN: [id number]

## 2015-11-30 NOTE — Evaluation (Signed)
Physical Therapy Evaluation Patient Details Name: [name] MRN: [id number] DOB: [birth year] Today's Date: 11/30/2015   History of Present Illness  70 y.o. male with extensive cardiac history and several recent admissions for HCAP which he failed several out patient antibody treatments.  Returns 10/24 with SOB, hypoxia and worsening diffuse bilateral infiltrates.  Clinical Impression  Pt admitted with above diagnosis. Pt currently with functional limitations due to the deficits listed below (see PT Problem List). Pt had episode of vomiiting after pt stood with HR decr as well as O2 sats.  Nurse and RT present and assisted PT in getting pt to supine and RT placed pt on nonrebreather mask.  Will follow acutely as medical status allows.  Pt will benefit from skilled PT to increase their independence and safety with mobility to allow discharge to the venue listed below.      Follow Up Recommendations Other (comment) (TBA unsure given pts episode during evaluation)    Equipment Recommendations  Other (comment) (TBA)    Recommendations for Other Services       Precautions / Restrictions Precautions Precautions: Fall;ICD/Pacemaker Restrictions Weight Bearing Restrictions: No      Mobility  Bed Mobility Overal bed mobility: Needs Assistance;+2 for physical assistance Bed Mobility: Supine to Sit     Supine to sit: Mod assist;+2 for physical assistance;Max assist Sit to supine: Total assist;+2 for physical assistance   General bed mobility comments: Struggled to get to EOB and to lie back down.  Pt with ataxic movements once trying to move.  Jerking all 4's at times.  Nursing wanted to change linens.  Sat up and pt was sitting with min assist for balance due to jerking movments at times.   Transfers Overall transfer level: Needs assistance Equipment used: 2 person hand held assist Transfers: Sit to/from Stand Sit to Stand: Mod assist;+2 physical assistance         General  transfer comment: Pt was on 6LO2 on arrival. Stood from bed with +2 assist with continued ataxic movments.  Nurse changed sheet and pad and had pt sit back.  Pt stated he was going to vomit and began vomiting. vomited incr amount for up to 30 seconds.  Desat and HR to 50 bpm.  As soon as PT and nurse could, assisted pt to supine and placed on 55% O2 as sats down to 69%.  RT came in and placed pt on nonrebreather mask and sats back to 92% within 3 minutes.  Hr to 75 bpm.    Ambulation/Gait                Stairs            Wheelchair Mobility    Modified Rankin (Stroke Patients Only)       Balance Overall balance assessment: Needs assistance Sitting-balance support: Bilateral upper extremity supported;Feet supported Sitting balance-Leahy Scale: Poor Sitting balance - Comments: reliant on UEs due to ataxic movement Postural control: Posterior lean Standing balance support: Bilateral upper extremity supported;During functional activity Standing balance-Leahy Scale: Poor Standing balance comment: relies on UE support and steadying assist of 2 persons                             Pertinent Vitals/Pain Pain Assessment: No/denies pain  SEe VS above    Home Living Family/patient expects to be discharged to:: Private residence Living Arrangements: Spouse/significant other Available Help at Discharge: Family;Available 24 hours/day Type of Home:  House Home Access: Level entry     Home Layout: Two level Home Equipment: None Additional Comments: Info from old chart as pt could not answer questions.    Prior Function Level of Independence: Independent               Hand Dominance        Extremity/Trunk Assessment   Upper Extremity Assessment: Defer to OT evaluation           Lower Extremity Assessment: Generalized weakness      Cervical / Trunk Assessment: Kyphotic  Communication   Communication: No difficulties  Cognition  Arousal/Alertness: Awake/alert Behavior During Therapy: WFL for tasks assessed/performed Overall Cognitive Status: Within Functional Limits for tasks assessed                      General Comments      Exercises     Assessment/Plan    PT Assessment Patient needs continued PT services  PT Problem List Decreased strength;Decreased range of motion;Decreased balance;Decreased activity tolerance;Decreased mobility;Decreased coordination;Decreased knowledge of use of DME;Decreased safety awareness;Cardiopulmonary status limiting activity          PT Treatment Interventions DME instruction;Gait training;Stair training;Functional mobility training;Therapeutic activities;Therapeutic exercise;Balance training;Neuromuscular re-education;Patient/family education    PT Goals (Current goals can be found in the Care Plan section)  Acute Rehab PT Goals Patient Stated Goal: to get home from hospital PT Goal Formulation: With patient Time For Goal Achievement: 12/14/15 Potential to Achieve Goals: Good    Frequency Min 3X/week   Barriers to discharge Inaccessible home environment      Co-evaluation               End of Session Equipment Utilized During Treatment: Gait belt;Oxygen Activity Tolerance: Treatment limited secondary to medical complications (Comment) (pt had vagal response and had to lie pt down) Patient left: in bed;with call bell/phone within reach;with nursing/sitter in room Nurse Communication: Mobility status         Time: [id number] PT Time Calculation (min) (ACUTE ONLY): 14 min   Charges:   PT Evaluation $PT Eval Moderate Complexity: 1 Procedure     PT G CodesBerline Lopes:        Addilynne Olheiser F 11/30/2015, 2:02 PM [facility] Pen[facility] Acute Rehabilitation [phone] (pager)

## 2015-11-30 NOTE — Progress Notes (Signed)
OT Cancellation Note  Patient Details Name: SHYAN DIECKHOFF MRN: [id number] DOB: May 27, 1945   Cancelled Treatment:    Reason Eval/Treat Not Completed: Medical issues which prohibited therapy.  Pt just finished standing with PT, had episode of emesis and bradycardia.  Will try back tomorrow and initiate OT if medically appropriate.  [employer], OTR/L [phone]   Jeani Hawking M 11/30/2015, 11:42 AM

## 2015-11-30 NOTE — Progress Notes (Signed)
Name: [name] MRN: [id number] DOB: 1945/12/23    ADMISSION DATE:  11/28/2015 CONSULTATION DATE:  11/28/2015  REFERRING MD :  Hal Hope TRH  CHIEF COMPLAINT:  Hypoxia, diffuse infiltrates  BRIEF PATIENT DESCRIPTION: 70 y.o. male with extensive cardiac history and several recent admissions for HCAP which he failed several out patient antibody treatments.  Returns 10/24 with SOB, hypoxia and worsening diffuse bilateral infiltrates.  SIGNIFICANT EVENTS  08/23 - admit for AICD firing 10/02 - admit for HCAP 10/13 - Admit for HCAP 10/24 - admit for worsening airspace disease and persistent hypoxia. 10/25 - Transfer to ICU for arrhythmia  STUDIES:  LHC 8/24: Prox RCA lesion 100%, Mid cx 70%. EF 25% estimated. Continue medical therapy, no PCI. CT Chest 10/13: Multi focal infiltrate involving the lower lobes, left greater than right, and the left upper lobe is most likely an infectious or inflammatory process. Recommend short-term follow-up to ensure resolution. 4 mm nodule in the right upper lobe. Recommend attention on short-term follow-up. TTE 10/26 >>  SEROLOGIES: CRP:  10.9 ESR:  40 PR-3:  <3.5 DS DNA Ab:  <1 MPO:  <9 RF:  12.1 C3:  78 C4:  9 ANA:  Negative ANCA >> Anti-CCP >>  SUBJECTIVE:  She had ventricular tachycardia overnight with hypotension requiring shock and lidocaine drip ordered by cardiology. Patient denies any chest pain or pressure. Reports dyspnea is improving. Denies any significant cough.  REVIEW OF SYSTEMS: No subjective fever, chills or sweats. No abdominal pain or nausea. Reports he did have some blurry vision bilaterally after waking in the middle of the night but no focal weakness.  VITAL SIGNS: Temp:  [96.1 F (35.6 C)-98.1 F (36.7 C)] 96.6 F (35.9 C) (10/26 0755) Pulse Rate:  [61-77] 71 (10/26 0755) Resp:  [17-33] 26 (10/26 0755) BP: (65-134)/(36-93) 97/80 (10/26 0755) SpO2:  [89 %-99 %] 96 % (10/26 0755) FiO2 (%):  [40 %-65 %] 65 %  (10/26 0500) Weight:  [140 lb 8 oz (63.7 kg)] 140 lb 8 oz (63.7 kg) (10/26 0513)  PHYSICAL EXAMINATION: General:  No distress. Awake. No family at bedside. Neuro:  Alert and oriented 4. No meningismus. Cranial nerves grossly nonfocal. HEENT: Moist mucous membranes. No scleral icterus. Pupils symmetric. Cardiovascular: Regular rate. No edema. Unable to appreciate JVD. Lungs: Crackles in bilateral lung zones. Normal work of breathing on nasal cannula oxygen. Speaking in complete sentences. Abdomen: Soft, nontender. Nondistended.. Musculoskeletal:  No joint effusion. Hand grip strength as well as foot flexion and extension strength 5/5 bilaterally.  Skin:  Warm and dry. No rash on exposed skin.   Recent Labs Lab 11/28/15 0258 11/29/15 0012 11/30/15 0440  NA 140 137 136  K 4.1 4.2 3.6  CL 109 104 101  CO2 '25 25 27  ' BUN 17 14 24*  CREATININE 0.98 0.92 0.98  GLUCOSE 121* 178* 168*    Recent Labs Lab 11/28/15 0258 11/29/15 0012 11/30/15 0440  HGB 9.7* 10.2* 10.3*  HCT 30.8* 32.7* 32.1*  WBC 10.0 9.2 14.2*  PLT 411* 475* 502*   Dg Chest Port 1 View  Result Date: 11/29/2015 CLINICAL DATA:  Dyspnea EXAM: PORTABLE CHEST 1 VIEW COMPARISON:  Chest radiograph from one day prior. FINDINGS: Stable configuration of single lead left subclavian ICD. Stable cardiomediastinal silhouette with mild cardiomegaly and aortic atherosclerosis. No pneumothorax. No pleural effusion. Re- demonstrated are extensive patchy and reticular opacities throughout both lungs, asymmetrically involving the left upper lung, not appreciably changed. IMPRESSION: Stable chest radiograph. Stable cardiomegaly and  extensive patchy and reticular opacities throughout both lungs, asymmetrically involving the left upper lung, for which the differential includes asymmetric pulmonary edema and/or pneumonia, with underlying interstitial lung disease not excluded. Electronically Signed   By: Ilona Sorrel M.D.   On: 11/29/2015  09:52    ASSESSMENT / PLAN:  70 y.o. 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 Patient is improving with regards to his oxygen requirement and respiratory status overall seems to be remaining stable. Continuing to have extremely low suspicion for an infectious process. Patient seems to have no neurological deficits this morning.  1. Acute hypoxic respiratory failure: Continuing to wean FiO2 for saturation greater than 92%. Recommend diuresis as renal function and blood pressure allow. 2. 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 Awaiting remaining serologies. Continuing Solu-Medrol 40 mg IV every 12 hours. 3. Possible pneumonia: This is doubtful. Discontinuing Vancomycin. Recommend discontinuing Zosyn in 24-48 hours as well.  Remainder of care as per cardiology and primary service.   Sonia Baller Ashok Cordia, M.D. [facility] Pul[facility] Critical [facility] Pager:  [phone] After 3pm or if no response, call [phone] 11/30/2015  9:19 AM

## 2015-12-01 ENCOUNTER — Encounter: Payer: Medicare Other | Admitting: Family Medicine

## 2015-12-01 ENCOUNTER — Inpatient Hospital Stay (HOSPITAL_COMMUNITY): Payer: Medicare Other

## 2015-12-01 LAB — BASIC METABOLIC PANEL
ANION GAP: 9 (ref 5–15)
BUN: 26 mg/dL — AB (ref 6–20)
CALCIUM: 7.6 mg/dL — AB (ref 8.9–10.3)
CO2: 30 mmol/L (ref 22–32)
Chloride: 98 mmol/L — ABNORMAL LOW (ref 101–111)
Creatinine, Ser: 1.09 mg/dL (ref 0.61–1.24)
GFR calc Af Amer: >60 mL/min (ref >60)
GLUCOSE: 142 mg/dL — AB (ref 65–99)
Potassium: 4.8 mmol/L (ref 3.5–5.1)
Sodium: 137 mmol/L (ref 135–145)

## 2015-12-01 LAB — CBC WITH DIFFERENTIAL/PLATELET
BASOS ABS: 0 10*3/uL (ref 0.0–0.1)
Basophils Relative: 0 %
EOS PCT: 0 %
Eosinophils Absolute: 0 10*3/uL (ref 0.0–0.7)
HCT: 30.5 % — ABNORMAL LOW (ref 39.0–52.0)
Hemoglobin: 9.9 g/dL — ABNORMAL LOW (ref 13.0–17.0)
LYMPHS PCT: 4 %
Lymphs Abs: 0.5 10*3/uL — ABNORMAL LOW (ref 0.7–4.0)
MCH: 28.2 pg (ref 26.0–34.0)
MCHC: 32.5 g/dL (ref 30.0–36.0)
MCV: 86.9 fL (ref 78.0–100.0)
MONO ABS: 0.6 10*3/uL (ref 0.1–1.0)
Monocytes Relative: 5 %
Neutro Abs: 12.4 10*3/uL — ABNORMAL HIGH (ref 1.7–7.7)
Neutrophils Relative %: 91 %
PLATELETS: 495 10*3/uL — AB (ref 150–400)
RBC: 3.51 MIL/uL — ABNORMAL LOW (ref 4.22–5.81)
RDW: 15.2 % (ref 11.5–15.5)
WBC: 13.5 10*3/uL — ABNORMAL HIGH (ref 4.0–10.5)

## 2015-12-01 LAB — COOXEMETRY PANEL
Carboxyhemoglobin: 1.7 % — ABNORMAL HIGH (ref 0.5–1.5)
Methemoglobin: 1.1 % (ref 0.0–1.5)
O2 Saturation: 63.6 %
Total hemoglobin: 10 g/dL — ABNORMAL LOW (ref 12.0–16.0)

## 2015-12-01 LAB — MAGNESIUM: Magnesium: 2.2 mg/dL (ref 1.7–2.4)

## 2015-12-01 MED ORDER — DOCUSATE SODIUM 100 MG PO CAPS
100.0000 mg | ORAL_CAPSULE | Freq: Two times a day (BID) | ORAL | Status: DC
  Administered 2015-12-01 – 2015-12-04 (×3): 100 mg via ORAL
  Filled 2015-12-01 (×7): qty 1

## 2015-12-01 MED ORDER — POTASSIUM CHLORIDE CRYS ER 20 MEQ PO TBCR: 20.0000 meq | EXTENDED_RELEASE_TABLET | Freq: Every day | ORAL | Status: DC

## 2015-12-01 NOTE — Progress Notes (Signed)
Rehab Admissions Coordinator Note:  Patient was screened by Trish Mage for appropriateness for an Inpatient Acute Rehab Consult.  At this time, we are recommending Inpatient Rehab consult.  Lelon Frohlich M 12/01/2015, 1:32 PM  I can be reached at [phone].

## 2015-12-01 NOTE — Evaluation (Addendum)
Occupational Therapy Evaluation Patient Details Name: [name] MRN: [id number] DOB: 1945/03/22 Today's Date: 12/01/2015    History of Present Illness 70 y.o. male with extensive cardiac history and several recent admissions for HCAP which he failed several out patient antibody treatments.  Returns 10/24 with SOB, hypoxia and worsening diffuse bilateral infiltrates.   Clinical Impression   This [age] yo male admitted with above presents to acute with deficits below (see OT problem list) thus affecting his PLOF prior to Oct of being totally independent with basic ADLs. He will benefit from acute OT with follow up OT on CIR.    Follow Up Recommendations  CIR    Equipment Recommendations  Other (comment) (TBD at next venue)       Precautions / Restrictions Precautions Precautions: Fall;Other (comment) (external defibrillator) Restrictions Weight Bearing Restrictions: No      Mobility Bed Mobility Overal bed mobility: Needs Assistance Bed Mobility: Supine to Sit     Supine to sit: Min assist;HOB elevated (use of rail)        Transfers Overall transfer level: Needs assistance Equipment used:  (therapist in front of him with Bil HHA) Transfers: Sit to/from Stand Sit to Stand: Min assist (min A sit<>stand--but very unsteady (weak) on his feet)         General transfer comment: Pt on high flow [place] (10 liters)--pt took O2 off to blow his nose and dropped to 81%. No issues with ataxic movements and no emesis today.    Balance Overall balance assessment: Needs assistance Sitting-balance support: No upper extremity supported;Feet supported Sitting balance-Leahy Scale: Fair     Standing balance support: Bilateral upper extremity supported;During functional activity Standing balance-Leahy Scale: Poor Standing balance comment: relies on UE support in standing                            ADL Overall ADL's : Needs assistance/impaired Eating/Feeding: Set up  (supported sitting)   Grooming: Set up (supported sitting)   Upper Body Bathing: Minimal assitance (supported sitting)   Lower Body Bathing: Moderate assistance (min A sit<>stand--but very unsteady (weak) on his feet)   Upper Body Dressing : Minimal assistance (supported sitting)   Lower Body Dressing: Total assistance (min A sit<>stand--but very unsteady (weak) on his feet)   Toilet Transfer: Minimal assistance;Stand-pivot Toilet Transfer Details (indicate cue type and reason): bed>recliner next to bed on pt's right Toileting- Clothing Manipulation and Hygiene: Maximal assistance (min A sit<>stand--but very unsteady (weak) on his feet)          Educated pt on purse lipped breathing.               Pertinent Vitals/Pain Pain Assessment: No/denies pain     Hand Dominance Right   Extremity/Trunk Assessment Upper Extremity Assessment Upper Extremity Assessment: Generalized weakness           Communication Communication Communication: No difficulties   Cognition Arousal/Alertness: Awake/alert Behavior During Therapy: WFL for tasks assessed/performed Overall Cognitive Status: Within Functional Limits for tasks assessed                                Home Living Family/patient expects to be discharged to:: Inpatient rehab Living Arrangements: Spouse/significant other Available Help at Discharge: Family;Available 24 hours/day Type of Home: House Home Access: Level entry     Home Layout: Two level Alternate Level Stairs-Number of Steps: 13  Alternate Level Stairs-Rails: Right Bathroom Shower/Tub: Producer, television/film/video: Standard Bathroom Accessibility: Yes   Home Equipment: None   Additional Comments: Info from old chart as pt could not answer questions.      Prior Functioning/Environment Level of Independence: Independent        Comments: Has been hospitalized 3 times this month (October)        OT Problem List: Decreased  strength;Impaired balance (sitting and/or standing);Decreased knowledge of use of DME or AE;Cardiopulmonary status limiting activity   OT Treatment/Interventions: Self-care/ADL training;Energy conservation;Therapeutic activities;Patient/family education;DME and/or AE instruction;Balance training    OT Goals(Current goals can be found in the care plan section) Acute Rehab OT Goals Patient Stated Goal: to go to rehab and then home OT Goal Formulation: With patient/family Time For Goal Achievement: 12/08/15 Potential to Achieve Goals: Good  OT Frequency: Min 2X/week              End of Session Nurse Communication: Mobility status  Activity Tolerance: Patient tolerated treatment well Patient left: in chair;with call bell/phone within reach;with family/visitor present   Time: 1209-1238 OT Time Calculation (min): 29 min Charges:  OT General Charges $OT Visit: 1 Procedure OT Evaluation $OT Eval Moderate Complexity: 1 Procedure OT Treatments $Self Care/Home Management : 8-22 mins   Evette Georges [phone] 12/01/2015, 1:19 PM

## 2015-12-01 NOTE — Progress Notes (Signed)
Patient Name: [name] Date of Encounter: 12/01/2015     Principal Problem:   Acute on chronic respiratory failure with hypoxia (HCC) Active Problems:   Hyperlipidemia   COPD (chronic obstructive pulmonary disease) (HCC)   Acute on chronic systolic CHF (congestive heart failure) (HCC)   Ischemic cardiomyopathy   History of ventricular tachycardia   Chronic atrial fibrillation (HCC)   Memory loss   HCAP (healthcare-associated pneumonia)   AICD (automatic cardioverter/defibrillator) present   Physical deconditioning   Transaminitis   Anemia   Elevated troponin   Hypoxia   Malnutrition of moderate degree   Opacity of lung on imaging study    SUBJECTIVE  Feels better today. Hallucinations and neuro changes largely resolved. No VT. No chest pain.  CURRENT MEDS . apixaban  5 mg Oral BID  . aspirin EC  81 mg Oral Daily  . ceFEPime (MAXIPIME) IV  1 g Intravenous Q8H  . docusate sodium  100 mg Oral BID  . donepezil  10 mg Oral QHS  . famotidine  20 mg Oral Daily  . feeding supplement  1 Container Oral TID BM  . guaiFENesin  600 mg Oral BID  . loratadine  10 mg Oral Daily  . methylPREDNISolone (SOLU-MEDROL) injection  40 mg Intravenous BID  . mexiletine  200 mg Oral Q8H  . multivitamin with minerals  1 tablet Oral Daily  . ondansetron (ZOFRAN) IV  4 mg Intravenous Q4H  . ranolazine  500 mg Oral BID  . sodium chloride flush  10-40 mL Intracatheter Q12H  . sodium chloride flush  3 mL Intravenous Q12H    OBJECTIVE  Vitals:   12/01/15 0700 12/01/15 0731 12/01/15 0826 12/01/15 1223  BP: 98/83 98/63  99/60  Pulse: 63 (!) 59 (!) 57   Resp: (!) 29 18 18    Temp:  98.2 F (36.8 C)  97.8 F (36.6 C)  TempSrc:  Oral  Axillary  SpO2: 96% 95% 90%   Weight:      Height:        Intake/Output Summary (Last 24 hours) at 12/01/15 1319 Last data filed at 12/01/15 0900  Gross per 24 hour  Intake            760.5 ml  Output              875 ml  Net           -114.5  ml   Filed Weights   11/29/15 0506 11/30/15 0513 12/01/15 0600  Weight: 140 lb 6.4 oz (63.7 kg) 140 lb 8 oz (63.7 kg) 136 lb 11 oz (62 kg)    PHYSICAL EXAM  General: Pleasant, NAD. Neuro: Alert and oriented X 3. Moves all extremities spontaneously. Psych: Normal affect. HEENT:  Normal  Neck: Supple without bruits or JVD. Lungs:  Resp regular and unlabored, CTA. Heart: RRR no s3, s4, or murmurs. Abdomen: Soft, non-tender, non-distended, BS + x 4.  Extremities: No clubbing, cyanosis or edema. DP/PT/Radials 2+ and equal bilaterally.  Accessory Clinical Findings  CBC  Recent Labs  11/30/15 0440 12/01/15 0430  WBC 14.2* 13.5*  NEUTROABS 13.2* 12.4*  HGB 10.3* 9.9*  HCT 32.1* 30.5*  MCV 87.0 86.9  PLT 502* 495*   Basic Metabolic Panel  Recent Labs  11/30/15 0440 11/30/15 0810 12/01/15 0430  NA 136  --  137  K 3.6  --  4.8  CL 101  --  98*  CO2 27  --  30  GLUCOSE 168*  --  142*  BUN 24*  --  26*  CREATININE 0.98  --  1.09  CALCIUM 7.4*  --  7.6*  MG  --  2.4 2.2   Liver Function Tests  Recent Labs  11/29/15 0012  AST 91*  ALT 79*  ALKPHOS 80  BILITOT 1.0  PROT 5.3*  ALBUMIN 2.0*   No results for input(s): LIPASE, AMYLASE in the last 72 hours. Cardiac Enzymes No results for input(s): CKTOTAL, CKMB, CKMBINDEX, TROPONINI in the last 72 hours. BNP Invalid input(s): POCBNP D-Dimer No results for input(s): DDIMER in the last 72 hours. Hemoglobin A1C No results for input(s): HGBA1C in the last 72 hours. Fasting Lipid Panel No results for input(s): CHOL, HDL, LDLCALC, TRIG, CHOLHDL, LDLDIRECT in the last 72 hours. Thyroid Function Tests No results for input(s): TSH, T4TOTAL, T3FREE, THYROIDAB in the last 72 hours.  Invalid input(s): FREET3  TELE  nsr  Radiology/Studies  Dg Chest 2 View  Result Date: 11/28/2015 CLINICAL DATA:  Shortness of breath EXAM: CHEST  2 VIEW COMPARISON:  11/24/2015, 03/06/2011, CT 11/17/2015 FINDINGS: Left-sided single  lead ICD. Lead placement similar compared to previous. There is cardiomegaly. Interval increase in interstitial opacities asymmetric in the left thorax. Slight increased interstitial opacities within the right lung base. Atherosclerosis of the aorta. No pneumothorax. IMPRESSION: 1. Increased interstitial and alveolar opacities within the left greater then right chest since the previous exam. 2. Stable degree of cardiomegaly. Electronically Signed   By: Jasmine PangKim  Fujinaga M.D.   On: 11/28/2015 04:38   Dg Chest 2 View  Result Date: 11/24/2015 CLINICAL DATA:  Follow up pneumonia. Shortness of breath. Quit smoking 2 months ago. EXAM: CHEST  2 VIEW COMPARISON:  11/21/2015 and 11/17/2015 as well as CT 11/17/2015 FINDINGS: Left-sided pacemaker unchanged. Lungs are adequately inflated demonstrate persistent mixed interstitial airspace opacification over the left lung with slight interval improved aeration of the upper lobe. No evidence of effusion stable cardiomegaly. Interval improvement with mild residual prominence of the perihilar markings. Remainder of the exam is unchanged. IMPRESSION: Slight interval improvement of mixed interstitial airspace process involving the left lung likely improving infection. Interval improvement mild perihilar prominence. Stable cardiomegaly. Electronically Signed   By: Elberta Fortisaniel  Boyle M.D.   On: 11/24/2015 13:36   Dg Chest 2 View  Result Date: 11/21/2015 CLINICAL DATA:  Dyspnea EXAM: CHEST  2 VIEW COMPARISON:  11/17/2015 FINDINGS: Radiographically progressed diffuse left lung opacity. Emphysematous spaces seen on prior chest CT. No cavitation or effusion. Chronic cardiopericardial enlargement. Single chamber ICD/ pacer from the left. No effusion or pneumothorax. IMPRESSION: 1. Progressed left lung pneumonia or pneumonitis. 2. Emphysema. 3. Cardiomegaly. Electronically Signed   By: Marnee SpringJonathon  Watts M.D.   On: 11/21/2015 11:30   Dg Chest 2 View  Result Date: 11/17/2015 CLINICAL DATA:   Chest pain.  Recent pneumonia. EXAM: CHEST  2 VIEW COMPARISON:  11/06/2015 FINDINGS: Pacer/AICD device. Midline trachea. Mild cardiomegaly. No pleural effusion or pneumothorax. Diffuse interstitial thickening. Persistent left lower lobe and developing left upper lobe airspace disease. IMPRESSION: Persistent left lower and developing left upper lobe airspace disease. This is superimposed upon cardiomegaly and diffuse interstitial prominence. Favor progressive pneumonia. Asymmetric pulmonary edema could look similar. Electronically Signed   By: Jeronimo GreavesKyle  Talbot M.D.   On: 11/17/2015 13:25   Dg Chest 2 View  Result Date: 11/06/2015 CLINICAL DATA:  Hypoxia, vomiting and diarrhea last night EXAM: CHEST  2 VIEW COMPARISON:  09/27/2015 and dating back through 11/12/2012 FINDINGS: The  heart is enlarged. AICD device projects over the left hemithorax with single lead projecting in the expected location of the right ventricle. Left lower lobe airspace disease suspicious for pneumonia is noted with small loculated posterior pleural effusion. No acute osseous abnormality. Cholecystectomy clips present in the right upper quadrant. IMPRESSION: New left lower lobe infiltrate with small posterior loculated pleural effusion. Electronically Signed   By: Tollie Eth M.D.   On: 11/06/2015 20:00   Ct Head Wo Contrast  Result Date: 11/28/2015 CLINICAL DATA:  Unresponsive, seizure like activity EXAM: CT HEAD WITHOUT CONTRAST TECHNIQUE: Contiguous axial images were obtained from the base of the skull through the vertex without intravenous contrast. COMPARISON:  10/13/2015 FINDINGS: Brain: No acute territorial infarction or hemorrhage. No focal mass, mass effect or midline shift. Mild to moderate cortical atrophy. Moderate periventricular and subcortical white matter small vessel ischemic changes. Ventricles are similar in size and configuration. Vascular: No hyperdense vessels. Calcifications within the carotid arteries at the  skullbase. Skull: No fracture.  Mastoid air cells clear. Sinuses/Orbits: Minimal mucosal thickening in the paranasal sinuses. No acute orbital abnormality. Other: None IMPRESSION: 1. No CT evidence for acute intracranial abnormality. 2. Moderate atrophy. Moderate periventricular and subcortical white matter small vessel ischemic disease. Electronically Signed   By: Jasmine Pang M.D.   On: 11/28/2015 04:35   Ct Chest Wo Contrast  Result Date: 11/17/2015 CLINICAL DATA:  Hypoxia.  Former smoker. EXAM: CT CHEST WITHOUT CONTRAST TECHNIQUE: Multidetector CT imaging of the chest was performed following the standard protocol without IV contrast. COMPARISON:  CT chest August 02, 2008 and recent chest x-rays. FINDINGS: Cardiovascular: A pacemaker device is seen. There are coronary artery calcifications on the left. Cardiomegaly is noted. No effusions. Mediastinum/Nodes: Small nodules in the thyroid of doubtful significance. The thoracic aorta is normal in caliber. It is also mildly tortuous with minimal atherosclerotic change. The central pulmonary arteries are normal. The visualized esophagus is unremarkable. A calcified node is seen in the right hilum on image 31 of series 2. A few calcified right hilar nodes are seen as well. A single mildly prominent right paratracheal node measures 12 mm likely reactive. A few other shotty nodes are identified. Lungs/Pleura: Mild opacity is seen in the right lung base. Opacity is also seen in the left upper lobe, primarily medially. More focal opacity is seen in the left lung base with a rounded region of opacity seen on axial image 90, series 5. Mild emphysematous changes. Tiny irregular nodule in the right upper lobe on series 5, image 53 measuring 4 mm. Evaluation for nodules is otherwise limited due the pulmonary opacities but no other nodules or masses are noted. Upper Abdomen: Previous cholecystectomy. No other acute abnormalities in the upper abdomen. Musculoskeletal:  Degenerative changes in the spine. No other acute bony abnormalities. IMPRESSION: 1. Multi focal infiltrate involving the lower lobes, left greater than right, and the left upper lobe is most likely an infectious or inflammatory process. Recommend short-term follow-up to ensure resolution. 2. 4 mm nodule in the right upper lobe. Recommend attention on short-term follow-up. Electronically Signed   By: Gerome Sam III M.D   On: 11/17/2015 19:28   Dg Chest Port 1 View  Result Date: 12/01/2015 CLINICAL DATA:  Dyspnea.  Pneumonia. EXAM: PORTABLE CHEST 1 VIEW COMPARISON:  11/30/2015 FINDINGS: Left greater than right interstitial and airspace opacities are without change from the previous day's study. No new lung abnormalities. Cardiac silhouette is normal in size. No mediastinal or hilar masses.  New right PICC has its tip in the lower superior vena cava. Left anterior chest wall AICD is stable. IMPRESSION: 1. No change in the left greater than right bilateral interstitial airspace lung opacities. No new lung abnormalities. 2. New right PICC is well positioned with its tip in the lower superior vena cava. Electronically Signed   By: Amie Portland M.D.   On: 12/01/2015 08:49   Dg Chest Port 1 View  Result Date: 11/30/2015 CLINICAL DATA:  Lethargy and difficulty breathing EXAM: PORTABLE CHEST 1 VIEW COMPARISON:  11/29/2015 FINDINGS: Cardiac shadow remains enlarged. A defibrillator is again seen and stable. Diffuse infiltrative changes are noted throughout both lungs left greater than right but stable from the prior exam. No new focal abnormality is noted. No bony abnormality is seen. IMPRESSION: Stable bilateral infiltrates left greater than right. Electronically Signed   By: Alcide Clever M.D.   On: 11/30/2015 09:41   Dg Chest Port 1 View  Result Date: 11/29/2015 CLINICAL DATA:  Dyspnea EXAM: PORTABLE CHEST 1 VIEW COMPARISON:  Chest radiograph from one day prior. FINDINGS: Stable configuration of single  lead left subclavian ICD. Stable cardiomediastinal silhouette with mild cardiomegaly and aortic atherosclerosis. No pneumothorax. No pleural effusion. Re- demonstrated are extensive patchy and reticular opacities throughout both lungs, asymmetrically involving the left upper lung, not appreciably changed. IMPRESSION: Stable chest radiograph. Stable cardiomegaly and extensive patchy and reticular opacities throughout both lungs, asymmetrically involving the left upper lung, for which the differential includes asymmetric pulmonary edema and/or pneumonia, with underlying interstitial lung disease not excluded. Electronically Signed   By: Delbert Phenix M.D.   On: 11/29/2015 09:52    ASSESSMENT AND PLAN  1. VT - he has had none in 48 hours. Continue mexilitine. If he has more VT, would add sotalol 80-120 bid 2. Acute on chronic systolic heart failure - Dr. Drucilla Chalet input appreciated. Note Rec's 3. Neuro changes - I very strongly suspect he was lidocaine toxic. This has essentially resolved. 4. Atrial fib - he has had none but now that off of amiodarone, suspect he will have more in the coming weeks/months.  Sharlot Gowda Marg Macmaster,M.D.  12/01/2015 1:19 PMPatient ID: Leda Min, male   DOB: 09-03-1945, 70 y.o.   MRN: [id number]

## 2015-12-01 NOTE — Progress Notes (Signed)
PROGRESS NOTE    Leda MinRichard L Gioffre  ZOX:[id number] DOB: 03-17-1945 DOA: 11/28/2015 PCP: Crawford GivensGraham Duncan, MD    Brief Narrative:  70 yo male with systolic ischemic cardiomyopathy ef 25%, presented with dyspnea and syncope. Admitted with working diagnosis of pulmonary edema to rule out pneumonia. Developed VT and VF with syncope on the medical ward. 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 induced lung injury   Assessment & Plan:   Principal Problem:   Acute on chronic respiratory failure with hypoxia (HCC) Active Problems:   Hyperlipidemia   COPD (chronic obstructive pulmonary disease) (HCC)   Acute on chronic systolic CHF (congestive heart failure) (HCC)   Ischemic cardiomyopathy   History of ventricular tachycardia   Chronic atrial fibrillation (HCC)   Memory loss   HCAP (healthcare-associated pneumonia)   AICD (automatic cardioverter/defibrillator) present   Physical deconditioning   Transaminitis   Anemia   Elevated troponin   Hypoxia   Malnutrition of moderate degree   Opacity of lung on imaging study   1. Syncope.  Continue telemetry monitoring. Out of bed as tolerated, physical therapy evaluation. Neuro checks per unit protocol.   2. VT/VF. Patient has remain VT free for last 48 hours, continue on mexiletin. Patient developed lidocaine toxicity, no drip has been discontinued.   3. Systolic ischemic heart failure.  Severe reduction on LV function down to 25%, ischemic cardiomyopathy, noted blood pressure down to 99 to 107 systolic. Will hold on furosemide for now. Noted urine output of 2100 cc over last 24 hours. Continue ranolazine and aspirin.   4. Atrial fibrillation. Anticoagulation with apixban. Heart rate in the 60's  5. Possible pneumonia. Patient on  cefepime, likely amiodarone lung injury, will hold antibiotic in am if patient continue to be stable, cultures continue to be negative. Wbc at 13.   7. Hypoxic respiratory failure. Suspected  amiodarone lung injury, will continue supplemental 02 with high flow nasal cannula, currently on 10 LPM flow with oxygen saturation at 90 to 95%. Will continue systemic steroids with methylprednisolone 40 mg bid.   8. Anxiety. Will continue diazepam as needed. Improve mentation and anxiety.   9. Hypokalemia. K has been corrected, will follow on renal panel in am. Keep K at 4 and mg at 2. Renal function with cr at 1.09   10. Sick euthyroid syndrome/ amiodarone toxicity. TSH low normal with elevated free T4. Clinically euthyroid. .   DVT prophylaxis:apixaban  Code Status:full  Family Communication:No family at the bedside   Disposition Plan:  Consultants:  Cardiology  Electrophysiology  Pulmonary   Antibiotics:              Cefepime #3   Subjective: Patient off lidocaine drip, more calm, less tremors. Patient has received diazepam. No chest pain, dyspnea stable, on high flow nasal cannula. Patient has not used bipap.   Objective: Vitals:   12/01/15 0500 12/01/15 0600 12/01/15 0700 12/01/15 0731  BP: 100/66 97/62 98/83  98/63  Pulse: 66 (!) 57 63 (!) 59  Resp: 18 19 (!) 29 18  Temp:    98.2 F (36.8 C)  TempSrc:    Oral  SpO2: 91% 98% 96% 95%  Weight:  62 kg (136 lb 11 oz)    Height:        Intake/Output Summary (Last 24 hours) at 12/01/15 0750 Last data filed at 12/01/15 0700  Gross per 24 hour  Intake           1250.5 ml  Output  1975 ml  Net           -724.5 ml   Filed Weights   11/29/15 0506 11/30/15 0513 12/01/15 0600  Weight: 63.7 kg (140 lb 6.4 oz) 63.7 kg (140 lb 8 oz) 62 kg (136 lb 11 oz)    Examination:  General exam: deconditioned. Ill looking appearing. E ENT: mild conjunctival pallor, oral mucosa dry. Respiratory system: Bilateral rales more left than right (upper lobe), no wheezing or rhonchi. No accessory muscle use.  Cardiovascular system: S1 & S2 heard, RRR. No JVD, rubs, gallops or clicks. No pedal edema.    Gastrointestinal system: Abdomen is nondistended, soft and nontender. No organomegaly or masses felt. Normal bowel sounds heard. Central nervous system: Alert and oriented. No focal neurological deficits. Extremities: Symmetric 5 x 5 power. Skin: No rashes, lesions or ulcers     Data Reviewed: I have personally reviewed following labs and imaging studies  CBC:  Recent Labs Lab 11/28/15 0258 11/29/15 0012 11/30/15 0440 12/01/15 0430  WBC 10.0 9.2 14.2* 13.5*  NEUTROABS 8.0*  --  13.2* 12.4*  HGB 9.7* 10.2* 10.3* 9.9*  HCT 30.8* 32.7* 32.1* 30.5*  MCV 87.5 88.1 87.0 86.9  PLT 411* 475* 502* 495*   Basic Metabolic Panel:  Recent Labs Lab 11/28/15 0258 11/28/15 0800 11/29/15 0012 11/29/15 1430 11/30/15 0440 11/30/15 0810 12/01/15 0430  NA 140  --  137  --  136  --  137  K 4.1  --  4.2  --  3.6  --  4.8  CL 109  --  104  --  101  --  98*  CO2 25  --  25  --  27  --  30  GLUCOSE 121*  --  178*  --  168*  --  142*  BUN 17  --  14  --  24*  --  26*  CREATININE 0.98  --  0.92  --  0.98  --  1.09  CALCIUM 7.4*  --  7.6*  --  7.4*  --  7.6*  MG  --  1.9  --  1.8  --  2.4 2.2  PHOS  --  2.5  --   --   --   --   --    GFR: Estimated Creatinine Clearance: 50 mL/min (by C-G formula based on SCr of 1.09 mg/dL). Liver Function Tests:  Recent Labs Lab 11/28/15 0258 11/29/15 0012  AST 109* 91*  ALT 80* 79*  ALKPHOS 76 80  BILITOT 1.1 1.0  PROT 5.0* 5.3*  ALBUMIN 2.0* 2.0*   No results for input(s): LIPASE, AMYLASE in the last 168 hours. No results for input(s): AMMONIA in the last 168 hours. Coagulation Profile: No results for input(s): INR, PROTIME in the last 168 hours. Cardiac Enzymes:  Recent Labs Lab 11/28/15 0258 11/28/15 1018  TROPONINI 0.05* 0.04*   BNP (last 3 results) No results for input(s): PROBNP in the last 8760 hours. HbA1C: No results for input(s): HGBA1C in the last 72 hours. CBG:  Recent Labs Lab 11/29/15 0915  GLUCAP 176*   Lipid  Profile: No results for input(s): CHOL, HDL, LDLCALC, TRIG, CHOLHDL, LDLDIRECT in the last 72 hours. Thyroid Function Tests:  Recent Labs  11/28/15 0800 11/29/15 0012  TSH 0.035*  --   FREET4  --  2.06*   Anemia Panel:  Recent Labs  11/28/15 1018  VITAMINB12 450  FOLATE 34.9  FERRITIN 312  TIBC 206*  IRON 26*  RETICCTPCT 2.5  Sepsis Labs:  Recent Labs Lab 11/28/15 0301 11/28/15 0737 11/30/15 0440  PROCALCITON  --  <0.10 <0.10  LATICACIDVEN 1.14  --   --     Recent Results (from the past 240 hour(s))  MRSA PCR Screening     Status: None   Collection Time: 11/29/15  2:35 PM  Result Value Ref Range Status   MRSA by PCR NEGATIVE NEGATIVE Final    Comment:        The GeneXpert MRSA Assay (FDA approved for NASAL specimens only), is one component of a comprehensive MRSA colonization surveillance program. It is not intended to diagnose MRSA infection nor to guide or monitor treatment for MRSA infections.          Radiology Studies: Dg Chest Port 1 View  Result Date: 11/30/2015 CLINICAL DATA:  Lethargy and difficulty breathing EXAM: PORTABLE CHEST 1 VIEW COMPARISON:  11/29/2015 FINDINGS: Cardiac shadow remains enlarged. A defibrillator is again seen and stable. Diffuse infiltrative changes are noted throughout both lungs left greater than right but stable from the prior exam. No new focal abnormality is noted. No bony abnormality is seen. IMPRESSION: Stable bilateral infiltrates left greater than right. Electronically Signed   By: Alcide Clever M.D.   On: 11/30/2015 09:41   Dg Chest Port 1 View  Result Date: 11/29/2015 CLINICAL DATA:  Dyspnea EXAM: PORTABLE CHEST 1 VIEW COMPARISON:  Chest radiograph from one day prior. FINDINGS: Stable configuration of single lead left subclavian ICD. Stable cardiomediastinal silhouette with mild cardiomegaly and aortic atherosclerosis. No pneumothorax. No pleural effusion. Re- demonstrated are extensive patchy and reticular  opacities throughout both lungs, asymmetrically involving the left upper lung, not appreciably changed. IMPRESSION: Stable chest radiograph. Stable cardiomegaly and extensive patchy and reticular opacities throughout both lungs, asymmetrically involving the left upper lung, for which the differential includes asymmetric pulmonary edema and/or pneumonia, with underlying interstitial lung disease not excluded. Electronically Signed   By: Delbert Phenix M.D.   On: 11/29/2015 09:52        Scheduled Meds: . apixaban  5 mg Oral BID  . aspirin EC  81 mg Oral Daily  . ceFEPime (MAXIPIME) IV  1 g Intravenous Q8H  . donepezil  10 mg Oral QHS  . famotidine  20 mg Oral Daily  . feeding supplement  1 Container Oral TID BM  . guaiFENesin  600 mg Oral BID  . loratadine  10 mg Oral Daily  . methylPREDNISolone (SOLU-MEDROL) injection  40 mg Intravenous BID  . mexiletine  200 mg Oral Q8H  . multivitamin with minerals  1 tablet Oral Daily  . ondansetron (ZOFRAN) IV  4 mg Intravenous Q4H  . ranolazine  500 mg Oral BID  . sodium chloride flush  10-40 mL Intracatheter Q12H  . sodium chloride flush  3 mL Intravenous Q12H   Continuous Infusions:    LOS: 3 days      Mauricio Annett Gula, MD [facility] Pager [phone]  If 7PM-7AM, please contact night-coverage [email or web address] Password [email or web address] 12/01/2015, 7:50 AM

## 2015-12-01 NOTE — Progress Notes (Signed)
Name: [name] MRN: [id number] DOB: 12/24/1945    ADMISSION DATE:  11/28/2015 CONSULTATION DATE:  11/28/2015  REFERRING MD :  Hal Hope TRH  CHIEF COMPLAINT:  Hypoxia, diffuse infiltrates  BRIEF PATIENT DESCRIPTION: 70 y.o. male with extensive cardiac history and several recent admissions for HCAP which he failed several out patient antibody treatments.  Returns 10/24 with SOB, hypoxia and worsening diffuse bilateral infiltrates.  SIGNIFICANT EVENTS  08/23 - admit for AICD firing 10/02 - admit for HCAP 10/13 - Admit for HCAP 10/24 - admit for worsening airspace disease and persistent hypoxia. 10/25 - Transfer to ICU for arrhythmia  STUDIES:  LHC 8/24: Prox RCA lesion 100%, Mid cx 70%. EF 25% estimated. Continue medical therapy, no PCI. CT Chest 10/13: Multi focal infiltrate involving the lower lobes, left greater than right, and the left upper lobe is most likely an infectious or inflammatory process. Recommend short-term follow-up to ensure resolution. 4 mm nodule in the right upper lobe. Recommend attention on short-term follow-up. TTE 10/26:  EF 25-30% w/ akinesis of inferolateral & inferior myocardium. Grade 2 diastolic dysfunction. RV normal in size and function. CP  SEROLOGIES: CRP: 10.9 ESR: 40 PR-3: <3.5 DS DNA Ab: <1 MPO: <9 RF: 12.1 C3: 78 C4: 9 ANA:  Negative ANCA:  Negative Anti-CCP:  19  SUBJECTIVE:  Patient taken off of amiodarone drip yesterday with hallucinations. Reports dyspnea is improving. Denies any chest pain, pressure, or tightness at this time. Denies any significant cough.  REVIEW OF SYSTEMS: No abdominal pain or nausea. Patient reports he has not had a bowel movement. Limited appetite. No subjective fever or chills. No headache or acute vision changes.  VITAL SIGNS: Temp:  [97 F (36.1 C)-98.3 F (36.8 C)] 98.2 F (36.8 C) (10/27 0731) Pulse Rate:  [54-69] 57 (10/27 0826) Resp:  [13-29] 18 (10/27 0826) BP: (85-119)/(44-83)  98/63 (10/27 0731) SpO2:  [90 %-100 %] 90 % (10/27 0826) FiO2 (%):  [100 %] 100 % (10/26 1147) Weight:  [136 lb 11 oz (62 kg)] 136 lb 11 oz (62 kg) (10/27 0600)  PHYSICAL EXAMINATION: General:  No distress. Awake and alert. Wife at bedside. Neuro:  Alert and oriented 4. No meningismus. Grossly nonfocal. HEENT: Moist mucous membranes. No scleral icterus. Pupils symmetric. Cardiovascular: Regular rate. No edema. No appreciable JVD. Lungs: Crackles in bilateral lung zones improving. Work of breathing is improving on high flow nasal cannula oxygen.  Abdomen: Soft. Nondistended & nontender. Skin:  Warm and dry. No rash on exposed skin.   Recent Labs Lab 11/29/15 0012 11/30/15 0440 12/01/15 0430  NA 137 136 137  K 4.2 3.6 4.8  CL 104 101 98*  CO2 _0 BUN 14 24* 26*  CREATININE 0.92 0.98 1.09  GLUCOSE 178* 168* 142*    Recent Labs Lab 11/29/15 0012 11/30/15 0440 12/01/15 0430  HGB 10.2* 10.3* 9.9*  HCT 32.7* 32.1* 30.5*  WBC 9.2 14.2* 13.5*  PLT 475* 502* 495*   Dg Chest Port 1 View  Result Date: 12/01/2015 CLINICAL DATA:  Dyspnea.  Pneumonia. EXAM: PORTABLE CHEST 1 VIEW COMPARISON:  11/30/2015 FINDINGS: Left greater than right interstitial and airspace opacities are without change from the previous day's study. No new lung abnormalities. Cardiac silhouette is normal in size. No mediastinal or hilar masses. New right PICC has its tip in the lower superior vena cava. Left anterior chest wall AICD is stable. IMPRESSION: 1. No change in the left greater than right bilateral interstitial airspace lung opacities.  No new lung abnormalities. 2. New right PICC is well positioned with its tip in the lower superior vena cava. Electronically Signed   By: Lajean Manes M.D.   On: 12/01/2015 08:49   Dg Chest Port 1 View  Result Date: 11/30/2015 CLINICAL DATA:  Lethargy and difficulty breathing EXAM: PORTABLE CHEST 1 VIEW COMPARISON:  11/29/2015 FINDINGS: Cardiac shadow remains  enlarged. A defibrillator is again seen and stable. Diffuse infiltrative changes are noted throughout both lungs left greater than right but stable from the prior exam. No new focal abnormality is noted. No bony abnormality is seen. IMPRESSION: Stable bilateral infiltrates left greater than right. Electronically Signed   By: Inez Catalina M.D.   On: 11/30/2015 09:41    ASSESSMENT / PLAN:  71 y.o. male with extensive cardiac history and acute hypoxic respiratory failure. Patient's oxygen requirement is steadily improving. I am cautious to de-escalate steroid therapy at this time given his previously marginal status. Remained highly suspicious that this is all secondary to amiodarone lung toxicity. Patient's rheumatoid factor and anti-CCP are weakly positive which could suggest an autoimmune etiology to the findings on his CT imaging. This will need to be reassessed as the patient stabilizes further with repeat serologies and further evaluation by rheumatology as an outpatient.  1. Acute hypoxic respiratory failure: Continue to wean FiO2 for saturation greater than 92%.  2. 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 Solu-Medrol 40 mg IV every 12 hours. Plan to de-escalate steroids on Monday. 3. Possible pneumonia: This is doubtful. Recommend discontinued Cefepime in 24 hours if remains stable.  Remainder of care as per cardiology and primary service. PCCM will re-evaluate patient on Monday. Contact rounding physician if there are questions or changes over the weekend.  Sonia Baller Ashok Cordia, M.D. [facility] Pager:  [phone] After 3pm or if no response, call [phone] 12/01/2015  9:48 AM

## 2015-12-01 NOTE — Consult Note (Signed)
Physical Medicine and Rehabilitation Consult  Reason for Consult: Debility Referring Physician: Dr. Haroldine Laws.    HPI: [name] is a 70 y.o. male with history of CAD, A Fib, ICM s/p AICD, COPD, anxiety disorder, multiple hospitalizations for HCAP in the past few months --most recent for HCAP requiring discharge to home on oxygen. He was readmitted on 11/28/15 with ongoing hypoxia and progressive diffuse bilateral infiltrates.  Dr. Titus Mould felt that infiltrates inflammatory in nature ---question vascular v/s amiodarone toxicity. Solumedrol added and amiodarone discontinued. He developed VT which accelerated to  VF with appropriate ICD therapy. His device was reprogrammed and he was started on Mexiletine for VT suppression.  He continues to require high flow oxygen due to persistent hypoxemia and was started on BIPAP at nights.  On broad spectrum antibiotics due to possibility of PNA. High levels of anxiety managed with diazepam. Cognitive changes due to lidocaine toxicity have resolved and he was able to participate in OT evaluation. CIR recommended by MD and rehab team.     Review of Systems  Constitutional: Positive for malaise/fatigue.  HENT: Negative for hearing loss.   Eyes: Negative for blurred vision and double vision.  Respiratory: Positive for sputum production. Negative for cough and shortness of breath.   Cardiovascular: Negative for chest pain and palpitations.  Gastrointestinal: Positive for diarrhea. Negative for heartburn and nausea.  Genitourinary: Negative for dysuria.  Musculoskeletal: Positive for back pain and myalgias.  Skin: Negative for itching.  Neurological: Positive for dizziness and weakness. Negative for speech change, focal weakness and headaches.  Psychiatric/Behavioral: The patient is nervous/anxious.       Past Medical History:  Diagnosis Date  . Acute lower GI bleeding 12/11/2011   "first time" (12/11/2011)  . Acute myocardial infarction,  unspecified site, episode of care unspecified 1995   Pt living in [place], no stent, ?PTCA  . AICD (automatic cardioverter/defibrillator) present   . Allergic rhinitis, cause unspecified   . Anxiety   . Arthritis    "all over" (11/07/2015)  . Atrial fibrillation ([place])    a. 05/2015 - converted to sinus in setting of ICD shocks; placed on eliquis 5 bid.  [name] CAD (coronary artery disease), autologous vein bypass graft   . Cervical herniated disc    told not to lift >10 lbs  . Chronic systolic CHF (congestive heart failure), NYHA class 2 (HCC)    Reports EF of 25%.   [name] COPD (chronic obstructive pulmonary disease) ([place]) 11/2012   by xray  . Diverticulosis    by CT scan  . HCAP (healthcare-associated pneumonia) 11/06/2015  . Hypertension   . Insomnia   . Paroxysmal ventricular tachycardia ([place])   . Perennial allergic rhinitis    only to dust mites  . Pneumonia 2000s   "walking pneumonia"  . VT (ventricular tachycardia) ([place])    a. 05/2015 - VT storm with multiple ICD shocks-->Amio 400 BID.    Past Surgical History:  Procedure Laterality Date  . CARDIAC CATHETERIZATION  2004   LAD 30%, D1 30%, CFX-AV groove 70-80%, OM1 30%, EF 20-25%  . CARDIAC CATHETERIZATION N/A 09/28/2015   Procedure: Left Heart Cath and Coronary Angiography;  Surgeon: Peter M Martinique, MD;  Location: [facility];  Service: Cardiovascular;  Laterality: N/A;  . CARDIAC DEFIBRILLATOR PLACEMENT  2004  . CATARACT EXTRACTION W/ INTRAOCULAR LENS IMPLANT Right 01/2012  . COLONOSCOPY  01/08/2012   Procedure: COLONOSCOPY;  Surgeon: Gatha Mayer, MD;  Location: [facility];  Service: Endoscopy;  Laterality: N/A;  . CORONARY ANGIOPLASTY  1995   Pt thinks he got a balloon, living in [place], [place] N/A 02/07/2012   Procedure: IMPLANTABLE CARDIOVERTER DEFIBRILLATOR GENERATOR CHANGE;  Surgeon: Evans Lance, MD; Medtronic Evera XT VR single-chamber serial number  [id number], Laterality: Left  . INSERT / REPLACE / REMOVE PACEMAKER  2004   Medtronic ICD  . KNEE ARTHROSCOPY Left 05/2003   Archie Endo 06/19/2010  . LAPAROSCOPIC CHOLECYSTECTOMY  1/ 2012  . SHOULDER ARTHROSCOPY W/ ROTATOR CUFF REPAIR Right twice  . TONSILLECTOMY AND ADENOIDECTOMY  ~ 1951    Family History  Problem Relation Age of Onset  . Diabetes Father   . Tracheal cancer Father 69    smoker  . Stroke Mother   . Cancer Sister     left eye  . CAD Neg Hx   . Colon cancer Neg Hx   . Prostate cancer Neg Hx     Social History:  Married. Retired Administrator. He  reports that he quit smoking about 2 months ago. His smoking use included Cigarettes and Cigars. He has a 25.00 pack-year smoking history. He has never used smokeless tobacco. He reports that he does not drink alcohol or use drugs.   Allergies  Allergen Reactions  . Ace Inhibitors Other (See Comments)    muscle pain. Tolerates ARBs.   . Codeine Other (See Comments)    "head wants to explode."  . Doxycycline Diarrhea and Nausea And Vomiting  . Penicillins Swelling    "started at point of injection; w/in 3 min my upper arm was swollen 3 times normal" Has patient had a PCN reaction causing immediate rash, facial/tongue/throat swelling, SOB or lightheadedness with hypotension: Yes Has patient had a PCN reaction causing severe rash involving mucus membranes or skin necrosis: No Has patient had a PCN reaction that required hospitalization No Has patient had a PCN reaction occurring within the last 10 years: No If all of the above answers are "NO", then may proceed wi  . Lisinopril     Muscle Pain  . Statins Other (See Comments)    Myalgias per patient    Medications Prior to Admission  Medication Sig Dispense Refill  . apixaban (ELIQUIS) 5 MG TABS tablet Take 1 tablet (5 mg total) by mouth 2 (two) times daily. Resume 09/29/15 60 tablet 6  . aspirin 81 MG EC tablet Take 1 tablet (81 mg total) by mouth daily.    . benzonatate  (TESSALON) 200 MG capsule TAKE 1 CAPSULE BY MOUTH 3 TIMES DAILY AS NEEDED FOR COUGH (Patient taking differently: TAKE 1 CAPSULE (200 mg) BY MOUTH 3 TIMES DAILY AS NEEDED FOR COUGH) 30 capsule 1  . diazepam (VALIUM) 5 MG tablet TAKE 1 TABLET BY MOUTH EVERY 6 HOURS AS NEEDED (Patient taking differently: TAKE 1 TABLET BY MOUTH EVERY 6 HOURS AS NEEDED FOR ANXIETY) 45 tablet 0  . donepezil (ARICEPT) 10 MG tablet Take 1 tablet (10 mg total) by mouth at bedtime. 90 tablet 1  . fexofenadine-pseudoephedrine (ALLEGRA-D 24) 180-240 MG 24 hr tablet Take 1 tablet by mouth daily.    [name] guaiFENesin (MUCINEX) 600 MG 12 hr tablet Take by mouth 2 (two) times daily.    [name] guaiFENesin-dextromethorphan (ROBITUSSIN DM) 100-10 MG/5ML syrup Take 5 mLs by mouth every 4 (four) hours as needed for cough. 118 mL 0  . HYDROcodone-acetaminophen (NORCO/VICODIN) 5-325 MG tablet TAKE 1 TABLET BY MOUTH EVERY 6 HOURS AS  NEEDED FOR PAIN (Patient taking differently: Take 1 tablet by mouth every 6 (six) hours as needed for moderate pain. ) 30 tablet 0  . levalbuterol (XOPENEX) 1.25 MG/0.5ML nebulizer solution Take 1.25 mg by nebulization every 6 (six) hours as needed for wheezing or shortness of breath. 1 each 12  . levofloxacin (LEVAQUIN) 750 MG tablet Take 1 tablet (750 mg total) by mouth daily. 7 tablet 0  . loperamide (IMODIUM) 2 MG capsule Take 1 capsule (2 mg total) by mouth as needed for diarrhea or loose stools. 30 capsule 0  . metaxalone (SKELAXIN) 800 MG tablet TAKE 1 TABLET BY MOUTH 3 TIMES A DAY AS NEEDED FOR PAIN. (Patient taking differently: Take 800 mg by mouth 3 (three) times daily as needed for muscle spasms. ) 90 tablet 1  . Multiple Vitamins-Minerals (MULTIVITAMIN ADULTS 50+) TABS Take 1 tablet by mouth daily.    . nitroGLYCERIN (NITROSTAT) 0.4 MG SL tablet PLACE 1 TABLET (0.4 MG TOTAL) UNDER THE TONGUE EVERY 5 (FIVE) MINUTES AS NEEDED. FOR CHEST PAIN. 25 tablet 1  . promethazine (PHENERGAN) 25 MG tablet Take 0.5-1 tablets  (12.5-25 mg total) by mouth every 8 (eight) hours as needed for nausea or vomiting. 30 tablet 0  . ranitidine (ZANTAC) 150 MG capsule Take 150 mg by mouth daily as needed for heartburn.     . ranolazine (RANEXA) 500 MG 12 hr tablet Take 1 tablet (500 mg total) by mouth 2 (two) times daily. 60 tablet 1  . traMADol (ULTRAM) 50 MG tablet TAKE 1 TO 2 TABLETS BY MOUTH EVERY 8 HOURS AS NEEDED (Patient taking differently: TAKE 1 TO 2 TABLETS BY MOUTH EVERY 8 HOURS AS NEEDED FOR PAIN.) 100 tablet 0  . zolpidem (AMBIEN CR) 12.5 MG CR tablet TAKE 1 TABLET BY MOUTH AT BEDTIME (Patient taking differently: TAKE 1 TABLET BY MOUTH AT BEDTIME AS NEEDED FOR SLEEP) 30 tablet 5  . amiodarone (PACERONE) 200 MG tablet Take 200 mg by mouth Mon-Fri and 400 mg on Sat and Sun. Please start taking once completed therapy with Levaquin on October 25th, 2017. (Patient not taking: Reported on 11/28/2015)  0    Home: Home Living Family/patient expects to be discharged to:: Inpatient rehab Living Arrangements: Spouse/significant other Available Help at Discharge: Family, Available 24 hours/day Type of Home: House Home Access: Level entry Home Layout: Two level Alternate Level Stairs-Number of Steps: 13 Alternate Level Stairs-Rails: Right Bathroom Shower/Tub: Multimedia programmer: Standard Bathroom Accessibility: Yes Home Equipment: None Additional Comments: Info from old chart as pt could not answer questions.  Functional History: Prior Function Level of Independence: Independent Comments: Has been hospitalized 3 times this month (October) Functional Status:  Mobility: Bed Mobility Overal bed mobility: Needs Assistance Bed Mobility: Supine to Sit Supine to sit: Min assist, HOB elevated (use of rail) Sit to supine: Total assist, +2 for physical assistance General bed mobility comments: Struggled to get to EOB and to lie back down.  Pt with ataxic movements once trying to move.  Jerking all 4's at times.   Nursing wanted to change linens.  Sat up and pt was sitting with min assist for balance due to jerking movments at times.  Transfers Overall transfer level: Needs assistance Equipment used:  (therapist in front of him with Bil HHA) Transfers: Sit to/from Stand Sit to Stand: Min assist (min A sit<>stand--but very unsteady (weak) on his feet) General transfer comment: Pt on high flow [place] (10 liters)--pt took O2 off to blow his nose  and dropped to 81%. No issues with ataxic movements and no emesis today.      ADL: ADL Overall ADL's : Needs assistance/impaired Eating/Feeding: Set up (supported sitting) Grooming: Set up (supported sitting) Upper Body Bathing: Minimal assitance (supported sitting) Lower Body Bathing: Moderate assistance (min A sit<>stand--but very unsteady (weak) on his feet) Upper Body Dressing : Minimal assistance (supported sitting) Lower Body Dressing: Total assistance (min A sit<>stand--but very unsteady (weak) on his feet) Toilet Transfer: Minimal assistance, Buyer, retail Details (indicate cue type and reason): bed>recliner next to bed on pt's right Toileting- Clothing Manipulation and Hygiene: Maximal assistance (min A sit<>stand--but very unsteady (weak) on his feet)  Cognition: Cognition Overall Cognitive Status: Within Functional Limits for tasks assessed Orientation Level: Oriented X4 Cognition Arousal/Alertness: Awake/alert Behavior During Therapy: WFL for tasks assessed/performed Overall Cognitive Status: Within Functional Limits for tasks assessed   Blood pressure (!) 107/58, pulse 64, temperature 98.1 F (36.7 C), temperature source Axillary, resp. rate (!) 23, height _0  (1.6 m), weight 62 kg (136 lb 11 oz), SpO2 100 %. Physical Exam  Nursing note and vitals reviewed. Constitutional: He is oriented to person, place, and time. He appears well-developed. He appears cachectic. He has a sickly appearance. Nasal cannula in place.  HENT:    Head: Normocephalic and atraumatic.  Eyes: Conjunctivae are normal. Pupils are equal, round, and reactive to light.  Neck: Normal range of motion. Neck supple.  Cardiovascular: Normal rate and regular rhythm.   Respiratory: Effort normal and breath sounds normal. No stridor. No respiratory distress. He has no wheezes.  GI: Soft. Bowel sounds are normal. He exhibits no distension. There is no tenderness.  Musculoskeletal: He exhibits no edema or tenderness.  Neurological: He is alert and oriented to person, place, and time.  Skin: Skin is warm and dry.    Results for orders placed or performed during the hospital encounter of 11/28/15 (from the past 24 hour(s))  .Cooxemetry Panel (carboxy, met, total hgb, O2 sat)     Status: Abnormal   Collection Time: 11/30/15  3:50 PM  Result Value Ref Range   Total hemoglobin 10.2 (L) 12.0 - 16.0 g/dL   O2 Saturation 66.3 %   Carboxyhemoglobin 1.2 0.5 - 1.5 %   Methemoglobin 1.5 0.0 - 1.5 %  CBC with Differential/Platelet     Status: Abnormal   Collection Time: 12/01/15  4:30 AM  Result Value Ref Range   WBC 13.5 (H) 4.0 - 10.5 K/uL   RBC 3.51 (L) 4.22 - 5.81 MIL/uL   Hemoglobin 9.9 (L) 13.0 - 17.0 g/dL   HCT 30.5 (L) 39.0 - 52.0 %   MCV 86.9 78.0 - 100.0 fL   MCH 28.2 26.0 - 34.0 pg   MCHC 32.5 30.0 - 36.0 g/dL   RDW 15.2 11.5 - 15.5 %   Platelets 495 (H) 150 - 400 K/uL   Neutrophils Relative % 91 %   Neutro Abs 12.4 (H) 1.7 - 7.7 K/uL   Lymphocytes Relative 4 %   Lymphs Abs 0.5 (L) 0.7 - 4.0 K/uL   Monocytes Relative 5 %   Monocytes Absolute 0.6 0.1 - 1.0 K/uL   Eosinophils Relative 0 %   Eosinophils Absolute 0.0 0.0 - 0.7 K/uL   Basophils Relative 0 %   Basophils Absolute 0.0 0.0 - 0.1 K/uL  Basic metabolic panel     Status: Abnormal   Collection Time: 12/01/15  4:30 AM  Result Value Ref Range   Sodium 137 135 -  145 mmol/L   Potassium 4.8 3.5 - 5.1 mmol/L   Chloride 98 (L) 101 - 111 mmol/L   CO2 30 22 - 32 mmol/L   Glucose, Bld  142 (H) 65 - 99 mg/dL   BUN 26 (H) 6 - 20 mg/dL   Creatinine, Ser 1.09 0.61 - 1.24 mg/dL   Calcium 7.6 (L) 8.9 - 10.3 mg/dL   GFR calc non Af Amer >60 >60 mL/min   GFR calc Af Amer >60 >60 mL/min   Anion gap 9 5 - 15  Magnesium     Status: None   Collection Time: 12/01/15  4:30 AM  Result Value Ref Range   Magnesium 2.2 1.7 - 2.4 mg/dL  .Cooxemetry Panel (carboxy, met, total hgb, O2 sat)     Status: Abnormal   Collection Time: 12/01/15  5:00 AM  Result Value Ref Range   Total hemoglobin 10.0 (L) 12.0 - 16.0 g/dL   O2 Saturation 63.6 %   Carboxyhemoglobin 1.7 (H) 0.5 - 1.5 %   Methemoglobin 1.1 0.0 - 1.5 %   Dg Chest Port 1 View  Result Date: 12/01/2015 CLINICAL DATA:  Dyspnea.  Pneumonia. EXAM: PORTABLE CHEST 1 VIEW COMPARISON:  11/30/2015 FINDINGS: Left greater than right interstitial and airspace opacities are without change from the previous day's study. No new lung abnormalities. Cardiac silhouette is normal in size. No mediastinal or hilar masses. New right PICC has its tip in the lower superior vena cava. Left anterior chest wall AICD is stable. IMPRESSION: 1. No change in the left greater than right bilateral interstitial airspace lung opacities. No new lung abnormalities. 2. New right PICC is well positioned with its tip in the lower superior vena cava. Electronically Signed   By: Lajean Manes M.D.   On: 12/01/2015 08:49   Dg Chest Port 1 View  Result Date: 11/30/2015 CLINICAL DATA:  Lethargy and difficulty breathing EXAM: PORTABLE CHEST 1 VIEW COMPARISON:  11/29/2015 FINDINGS: Cardiac shadow remains enlarged. A defibrillator is again seen and stable. Diffuse infiltrative changes are noted throughout both lungs left greater than right but stable from the prior exam. No new focal abnormality is noted. No bony abnormality is seen. IMPRESSION: Stable bilateral infiltrates left greater than right. Electronically Signed   By: Inez Catalina M.D.   On: 11/30/2015 09:41     Assessment/Plan: Diagnosis: Debility related to acute on chronic respiratory failure/hypoxia and multiple medical issues above 1. Does the need for close, 24 hr/day medical supervision in concert with the patient's rehab needs make it unreasonable for this patient to be served in a less intensive setting? Yes 2. Co-Morbidities requiring supervision/potential complications: afib, a/c sCHF, ICM 3. Due to bladder management, bowel management, safety, skin/wound care, disease management, medication administration, pain management and patient education, does the patient require 24 hr/day rehab nursing? Yes 4. Does the patient require coordinated care of a physician, rehab nurse, PT (1-2 hrs/day, 5 days/week) and OT (102 hrs/day, 5 days/week) to address physical and functional deficits in the context of the above medical diagnosis(es)? Yes Addressing deficits in the following areas: balance, endurance, locomotion, strength, transferring, bowel/bladder control, bathing, dressing, feeding, grooming, toileting and psychosocial support 5. Can the patient actively participate in an intensive therapy program of at least 3 hrs of therapy per day at least 5 days per week? Yes and Potentially 6. The potential for patient to make measurable gains while on inpatient rehab is good 7. Anticipated functional outcomes upon discharge from inpatient rehab are modified independent and  supervision  with PT, modified independent and supervision with OT, n/a with SLP. 8. Estimated rehab length of stay to reach the above functional goals is: potentially 8-13 days 9. Does the patient have adequate social supports and living environment to accommodate these discharge functional goals? Yes and Potentially 10. Anticipated D/C setting: Home 11. Anticipated post D/C treatments: [place] therapy 12. Overall Rehab/Functional Prognosis: excellent  RECOMMENDATIONS: This patient's condition is appropriate for continued rehabilitative  care in the following setting: likey CIR Patient has agreed to participate in recommended program. Yes Note that insurance prior authorization may be required for reimbursement for recommended care.  Comment: Will follow for increased activity tolerance with therapy.   Meredith Staggers, MD, [facility] 12/02/2015     12/01/2015

## 2015-12-01 NOTE — Progress Notes (Signed)
Advanced Heart Failure Rounding Note  PCP: Crawford Givens, MD Primary Cardiologist/EP: Dr. [name]   Subjective:    Admitted 11/28/15 with a/c respiratory failure. Had VT with shock 11/29/15. EP following.   PICC placed 11/30/15 with concerns for low output. Initial coox 66%.  Feeling much better this am off lidocaine. No further tremors, anxiety, or confusion.  Still having ? Visual hallucinations.  He described them as "day nightmares".  Denies SOB or CP. No lightheadedness or dizziness, but not getting OOB.   Negative 755 cc yesterday. Weight stable. Creatinine 1.09.  K 4.8. Mg 2.2  Objective:   Weight Range: 136 lb 11 oz (62 kg) Body mass index is 24.21 kg/m.   Vital Signs:   Temp:  [96.6 F (35.9 C)-98.3 F (36.8 C)] 98.2 F (36.8 C) (10/27 0731) Pulse Rate:  [54-71] 59 (10/27 0731) Resp:  [13-29] 18 (10/27 0731) BP: (85-120)/(44-83) 98/63 (10/27 0731) SpO2:  [89 %-100 %] 95 % (10/27 0731) FiO2 (%):  [100 %] 100 % (10/26 1147) Weight:  [136 lb 11 oz (62 kg)] 136 lb 11 oz (62 kg) (10/27 0600) Last BM Date: 11/28/15  Weight change: Filed Weights   11/29/15 0506 11/30/15 0513 12/01/15 0600  Weight: 140 lb 6.4 oz (63.7 kg) 140 lb 8 oz (63.7 kg) 136 lb 11 oz (62 kg)    Intake/Output:   Intake/Output Summary (Last 24 hours) at 12/01/15 0734 Last data filed at 12/01/15 0700  Gross per 24 hour  Intake           1250.5 ml  Output             1975 ml  Net           -724.5 ml     Physical Exam: CVP 4-5 General:  Chronically ill and fatigued. Sitting up in bed.  HEENT: Normal x for temporal wasting  Neck: supple. JVP 4-5 cm. Carotids 2+ bilat; no bruits. No thyromegaly or nodule noted.  Cor: PMI nondisplaced. Regular. +s3 Lungs: Diminished, basilar crackles Abdomen: soft, NT, ND, no HSM. No bruits or masses. +BS  Extremities: no cyanosis, clubbing, rash. No peripheral edema Neuro: alert & orientedx3, cranial nerves grossly intact. moves all 4 extremities w/o  difficulty. Affect flat.   Telemetry: Reviewed, NSR 60s. No further VT.   Labs: CBC  Recent Labs  11/30/15 0440 12/01/15 0430  WBC 14.2* 13.5*  NEUTROABS 13.2* 12.4*  HGB 10.3* 9.9*  HCT 32.1* 30.5*  MCV 87.0 86.9  PLT 502* 495*   Basic Metabolic Panel  Recent Labs  11/28/15 0800  11/30/15 0440 11/30/15 0810 12/01/15 0430  NA  --   < > 136  --  137  K  --   < > 3.6  --  4.8  CL  --   < > 101  --  98*  CO2  --   < > 27  --  30  GLUCOSE  --   < > 168*  --  142*  BUN  --   < > 24*  --  26*  CREATININE  --   < > 0.98  --  1.09  CALCIUM  --   < > 7.4*  --  7.6*  MG 1.9  < >  --  2.4 2.2  PHOS 2.5  --   --   --   --   < > = values in this interval not displayed. Liver Function Tests  Recent Labs  11/29/15 0012  AST 91*  ALT 79*  ALKPHOS 80  BILITOT 1.0  PROT 5.3*  ALBUMIN 2.0*   No results for input(s): LIPASE, AMYLASE in the last 72 hours. Cardiac Enzymes  Recent Labs  11/28/15 1018  TROPONINI 0.04*    BNP: BNP (last 3 results)  Recent Labs  09/27/15 0658 11/07/15 0253 11/28/15 0737  BNP 184.8* 184.2* 494.0*    ProBNP (last 3 results) No results for input(s): PROBNP in the last 8760 hours.   D-Dimer No results for input(s): DDIMER in the last 72 hours. Hemoglobin A1C No results for input(s): HGBA1C in the last 72 hours. Fasting Lipid Panel No results for input(s): CHOL, HDL, LDLCALC, TRIG, CHOLHDL, LDLDIRECT in the last 72 hours. Thyroid Function Tests  Recent Labs  11/28/15 0800  TSH 0.035*    Other results:     Imaging/Studies:  Dg Chest Port 1 View  Result Date: 11/30/2015 CLINICAL DATA:  Lethargy and difficulty breathing EXAM: PORTABLE CHEST 1 VIEW COMPARISON:  11/29/2015 FINDINGS: Cardiac shadow remains enlarged. A defibrillator is again seen and stable. Diffuse infiltrative changes are noted throughout both lungs left greater than right but stable from the prior exam. No new focal abnormality is noted. No bony  abnormality is seen. IMPRESSION: Stable bilateral infiltrates left greater than right. Electronically Signed   By: Alcide CleverMark  Lukens M.D.   On: 11/30/2015 09:41   Dg Chest Port 1 View  Result Date: 11/29/2015 CLINICAL DATA:  Dyspnea EXAM: PORTABLE CHEST 1 VIEW COMPARISON:  Chest radiograph from one day prior. FINDINGS: Stable configuration of single lead left subclavian ICD. Stable cardiomediastinal silhouette with mild cardiomegaly and aortic atherosclerosis. No pneumothorax. No pleural effusion. Re- demonstrated are extensive patchy and reticular opacities throughout both lungs, asymmetrically involving the left upper lung, not appreciably changed. IMPRESSION: Stable chest radiograph. Stable cardiomegaly and extensive patchy and reticular opacities throughout both lungs, asymmetrically involving the left upper lung, for which the differential includes asymmetric pulmonary edema and/or pneumonia, with underlying interstitial lung disease not excluded. Electronically Signed   By: Delbert PhenixJason A Poff M.D.   On: 11/29/2015 09:52    Latest Echo   Latest Cath   Medications:     Scheduled Medications: . apixaban  5 mg Oral BID  . aspirin EC  81 mg Oral Daily  . ceFEPime (MAXIPIME) IV  1 g Intravenous Q8H  . donepezil  10 mg Oral QHS  . famotidine  20 mg Oral Daily  . feeding supplement  1 Container Oral TID BM  . furosemide  40 mg Intravenous BID  . guaiFENesin  600 mg Oral BID  . loratadine  10 mg Oral Daily  . methylPREDNISolone (SOLU-MEDROL) injection  40 mg Intravenous BID  . mexiletine  200 mg Oral Q8H  . multivitamin with minerals  1 tablet Oral Daily  . ondansetron (ZOFRAN) IV  4 mg Intravenous Q4H  . potassium chloride  20 mEq Oral BID  . ranolazine  500 mg Oral BID  . sodium chloride flush  10-40 mL Intracatheter Q12H  . sodium chloride flush  3 mL Intravenous Q12H    Infusions:    PRN Medications: acetaminophen **OR** acetaminophen, benzonatate, diazepam, HYDROcodone-acetaminophen,  levalbuterol, loperamide, metaxalone, ondansetron (ZOFRAN) IV, sodium chloride flush, traMADol, zolpidem   Assessment/Plan   1. Acute on chronic combined HF -  recent EF 25% by cath 09/28/15. Echo pending.  - Volume status stable. CVP 4-5 cm. Co-ox 64% - Stop IV lasix.  He was not on home diuretics.  Will likely start  40 mg lasix daily.  - Echo 12/01/15 EF 25-30%, Grade 2 DD, mild AI, Severe MS, Mod LAW, Mod RAE, Mod TI.  2. VT - s/p Medtronic ICD with Optivol.  - EP following. On  Mexitil. Lidocaine stopped with concerns for toxicity.  - Mg stable. Supp K. Will aim to keep Mg > 2.0, goal K > 4.0.  3. ?HCAP vs amiodarone lung toxicity - ABX per CCM.  - Remains on Cefepime. WBC 13.5. PCT < 0.10, Afebrile - Off amiodarone with concerns for toxicity.  4. CAD - LHC 09/28/15 with chronic total occlusion of the proximal RCA and moderate disease in Distal LCx and third OM.    5. Altered Mental Status - Resolved. Pt is much more alert this am.   - ? Component of Lidocaine toxicity.  6. Hyperthyroidism ? 2/2 amiodarone toxicity - TSH 0.035 (normal 0.350 - 4.5) - T4 2.06 (normal 0.61 - 1.12) - T3 56 ( normal 71-180) - Management per CCM/IM. Currently on steroids. ?methimazole. Add low-dose b-blocker as tolerated  Length of Stay: 3  Graciella Freer PA-C 12/01/2015, 7:34 AM  Advanced Heart Failure Team Pager [phone] (M-F; 7a - 4p)  Please contact [facility] for night-coverage after hours (4p -7a ) and weekends on [email or web address]  Patient seen and examined with Otilio Saber, PA-C. We discussed all aspects of the encounter. I agree with the assessment and plan as stated above.   Patient much improved this am. Suspect component of lidocaine toxicity yesterday. CVP and co-ox look suprisingly good. Will stop lasix. No need for inotropes at this time. VT quiescent on mexilitene. Continue steroids for amio lung toxicity.   I have reviewed cath films and echo again and suspect his CM is  proportional to his severe CAD with totally occluded RCA and significant disease in large LCX. May need thallium viability at some point. Will continue medical therapy for now and reassess as he improves. I remain concerned about his relatively precipitous downward course over past few months.   Yexalen Deike,MD 8:58 AM

## 2015-12-02 DIAGNOSIS — J9601 Acute respiratory failure with hypoxia: Secondary | ICD-10-CM

## 2015-12-02 LAB — BASIC METABOLIC PANEL
Anion gap: 6 (ref 5–15)
BUN: 32 mg/dL — AB (ref 6–20)
CHLORIDE: 101 mmol/L (ref 101–111)
CO2: 31 mmol/L (ref 22–32)
CREATININE: 0.88 mg/dL (ref 0.61–1.24)
Calcium: 7.7 mg/dL — ABNORMAL LOW (ref 8.9–10.3)
GFR calc Af Amer: >60 mL/min (ref >60)
GFR calc non Af Amer: >60 mL/min (ref >60)
GLUCOSE: 168 mg/dL — AB (ref 65–99)
POTASSIUM: 4.7 mmol/L (ref 3.5–5.1)
SODIUM: 138 mmol/L (ref 135–145)

## 2015-12-02 LAB — COOXEMETRY PANEL
Carboxyhemoglobin: 1.1 % (ref 0.5–1.5)
METHEMOGLOBIN: 1.1 % (ref 0.0–1.5)
O2 SAT: 69.6 %
TOTAL HEMOGLOBIN: 9.5 g/dL — AB (ref 12.0–16.0)

## 2015-12-02 LAB — PROCALCITONIN: Procalcitonin: <0.1 ng/mL

## 2015-12-02 LAB — MAGNESIUM: MAGNESIUM: 2.2 mg/dL (ref 1.7–2.4)

## 2015-12-02 MED ORDER — POLYETHYLENE GLYCOL 3350 17 G PO PACK
17.0000 g | PACK | Freq: Every day | ORAL | Status: DC
  Filled 2015-12-02 (×3): qty 1

## 2015-12-02 MED ORDER — CARVEDILOL 3.125 MG PO TABS
3.1250 mg | ORAL_TABLET | Freq: Two times a day (BID) | ORAL | Status: DC
  Administered 2015-12-02 – 2015-12-06 (×9): 3.125 mg via ORAL
  Filled 2015-12-02 (×9): qty 1

## 2015-12-02 NOTE — Progress Notes (Signed)
PROGRESS NOTE    [name]  ZOX:[id number] DOB: May 18, 1945 DOA: 11/28/2015 PCP: Crawford GivensGraham Duncan, MD    Brief Narrative:  70 yo male with systolic ischemic cardiomyopathy ef 25%, presented with dyspnea and syncope. Admitted with working diagnosis of pulmonary edema to rule out pneumonia. Developed VT and VF with syncope on the medical ward. 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 induced lung injury. On mexilitine ventricular arrhythmia has been controlled, antibiotics discontinued, on systemic steroids for lung injury. On high flow nasal cannula.    Assessment & Plan:   Principal Problem:   Acute on chronic respiratory failure with hypoxia (HCC) Active Problems:   Hyperlipidemia   COPD (chronic obstructive pulmonary disease) (HCC)   Acute on chronic systolic CHF (congestive heart failure) (HCC)   Ischemic cardiomyopathy   History of ventricular tachycardia   Chronic atrial fibrillation (HCC)   Memory loss   HCAP (healthcare-associated pneumonia)   AICD (automatic cardioverter/defibrillator) present   Physical deconditioning   Transaminitis   Anemia   Elevated troponin   Hypoxia   Malnutrition of moderate degree   Opacity of lung on imaging study   1. VT/VF. Patient tolerating well antiarrhythmic therapy with mexiletine. Will continue telemetry monitoring in the step down unit, target K of 4 and Mg of 2. Keep 02 sat above 92%.   3. Systolic ischemic heart failure.  Severe reduction on LV function down to 25%, ischemic cardiomyopathy. Holding furosemide for now due to hypotension, blood pressure has improved, to systolic 113. Clinically patient not hypervolemic. Patient started on carvedilol. Continue on renexa.   4. Atrial fibrillation. Anticoagulation with apixban. Heart rate in the 60's. Rate control with mexiletine. Patient on sinus and 1st degree av block on telemetry.   5. Pneumonia rule out. No signs of infection, patient has remained  afebrile, leukocytosis improving, cultures no growth, will hold on antibiotics and will continue to follow up clinically.   7. Hypoxic respiratory failure/ Amiodarone lung injury. Continue supplemental 02 with high flow nasal cannula, currently on 10 LPM flow with oxygen saturation at 90 to 95%. Will continue systemic steroids with methylprednisolone 40 mg bid. Will need a very slow taper over 2 to 6 months. Continue on levalbuterol.   8. Anxiety.  Diazepam as needed. Improve mentation and anxiety. Patient more orientated and calm.   9. Hypokalemia.  Furosemide on hold, K is up to 4,7, will follow on renal panel in am. Renal function with normal creatinine.   10. Sick euthyroid syndrome/ amiodarone toxicity. TSH low normal with elevated free T4. Clinically euthyroid. Will need outpatient PFT.   DVT prophylaxis:apixaban  Code Status:full  Family Communication:No family at the bedside   Consultants:   Cardiology   Pulmonary  EP  Procedures:   Antimicrobials:  Cefepime 10/24 to 10/28  Subjective: Patient feeling better, dyspnea improving, still on high flow nasal cannula. No chest pain or palpitations, no further syncope. Feeling weak and deconditioned.   Objective: Vitals:   12/02/15 0400 12/02/15 0451 12/02/15 0500 12/02/15 0600  BP: 113/65  113/68 (!) 102/58  Pulse: 64  67 (!) 58  Resp: (!) 29  (!) 31 16  Temp:  97.6 F (36.4 C)    TempSrc:  Axillary    SpO2: 90%  95% 98%  Weight: 61.1 kg (134 lb 11.2 oz)     Height:        Intake/Output Summary (Last 24 hours) at 12/02/15 0745 Last data filed at 12/02/15 0500  Gross per 24 hour  Intake             1117 ml  Output              601 ml  Net              516 ml   Filed Weights   11/30/15 0513 12/01/15 0600 12/02/15 0400  Weight: 63.7 kg (140 lb 8 oz) 62 kg (136 lb 11 oz) 61.1 kg (134 lb 11.2 oz)    Examination:  General exam: deconditioned, not in pain or dyspnea. E ENT: mild conjunctival pallor, oral  mucosa dry.  Respiratory system: Rales scattered bilaterally, no wheezing or rhonchi.   Cardiovascular system: S1 & S2 heard, RRR. No JVD,  rubs, gallops or clicks. No pedal edema. Gastrointestinal system: Abdomen is nondistended, soft and nontender. No organomegaly or masses felt. Normal bowel sounds heard. Central nervous system: Alert and oriented. No focal neurological deficits. Extremities: Symmetric 5 x 5 power. Skin: No rashes, lesions or ulcers      Data Reviewed: I have personally reviewed following labs and imaging studies  CBC:  Recent Labs Lab 11/28/15 0258 11/29/15 0012 11/30/15 0440 12/01/15 0430  WBC 10.0 9.2 14.2* 13.5*  NEUTROABS 8.0*  --  13.2* 12.4*  HGB 9.7* 10.2* 10.3* 9.9*  HCT 30.8* 32.7* 32.1* 30.5*  MCV 87.5 88.1 87.0 86.9  PLT 411* 475* 502* 495*   Basic Metabolic Panel:  Recent Labs Lab 11/28/15 0258 11/28/15 0800 11/29/15 0012 11/29/15 1430 11/30/15 0440 11/30/15 0810 12/01/15 0430 12/02/15 0430  NA 140  --  137  --  136  --  137 138  K 4.1  --  4.2  --  3.6  --  4.8 4.7  CL 109  --  104  --  101  --  98* 101  CO2 25  --  25  --  27  --  30 31  GLUCOSE 121*  --  178*  --  168*  --  142* 168*  BUN 17  --  14  --  24*  --  26* 32*  CREATININE 0.98  --  0.92  --  0.98  --  1.09 0.88  CALCIUM 7.4*  --  7.6*  --  7.4*  --  7.6* 7.7*  MG  --  1.9  --  1.8  --  2.4 2.2 2.2  PHOS  --  2.5  --   --   --   --   --   --    GFR: Estimated Creatinine Clearance: 62 mL/min (by C-G formula based on SCr of 0.88 mg/dL). Liver Function Tests:  Recent Labs Lab 11/28/15 0258 11/29/15 0012  AST 109* 91*  ALT 80* 79*  ALKPHOS 76 80  BILITOT 1.1 1.0  PROT 5.0* 5.3*  ALBUMIN 2.0* 2.0*   No results for input(s): LIPASE, AMYLASE in the last 168 hours. No results for input(s): AMMONIA in the last 168 hours. Coagulation Profile: No results for input(s): INR, PROTIME in the last 168 hours. Cardiac Enzymes:  Recent Labs Lab 11/28/15 0258  11/28/15 1018  TROPONINI 0.05* 0.04*   BNP (last 3 results) No results for input(s): PROBNP in the last 8760 hours. HbA1C: No results for input(s): HGBA1C in the last 72 hours. CBG:  Recent Labs Lab 11/29/15 0915  GLUCAP 176*   Lipid Profile: No results for input(s): CHOL, HDL, LDLCALC, TRIG, CHOLHDL, LDLDIRECT in the last 72 hours. Thyroid Function Tests: No results for input(s): TSH, T4TOTAL,  FREET4, T3FREE, THYROIDAB in the last 72 hours. Anemia Panel: No results for input(s): VITAMINB12, FOLATE, FERRITIN, TIBC, IRON, RETICCTPCT in the last 72 hours. Sepsis Labs:  Recent Labs Lab 11/28/15 0301 11/28/15 0737 11/30/15 0440  PROCALCITON  --  <0.10 <0.10  LATICACIDVEN 1.14  --   --     Recent Results (from the past 240 hour(s))  MRSA PCR Screening     Status: None   Collection Time: 11/29/15  2:35 PM  Result Value Ref Range Status   MRSA by PCR NEGATIVE NEGATIVE Final    Comment:        The GeneXpert MRSA Assay (FDA approved for NASAL specimens only), is one component of a comprehensive MRSA colonization surveillance program. It is not intended to diagnose MRSA infection nor to guide or monitor treatment for MRSA infections.          Radiology Studies: Dg Chest Port 1 View  Result Date: 12/01/2015 CLINICAL DATA:  Dyspnea.  Pneumonia. EXAM: PORTABLE CHEST 1 VIEW COMPARISON:  11/30/2015 FINDINGS: Left greater than right interstitial and airspace opacities are without change from the previous day's study. No new lung abnormalities. Cardiac silhouette is normal in size. No mediastinal or hilar masses. New right PICC has its tip in the lower superior vena cava. Left anterior chest wall AICD is stable. IMPRESSION: 1. No change in the left greater than right bilateral interstitial airspace lung opacities. No new lung abnormalities. 2. New right PICC is well positioned with its tip in the lower superior vena cava. Electronically Signed   By: Amie Portland M.D.   On:  12/01/2015 08:49   Dg Chest Port 1 View  Result Date: 11/30/2015 CLINICAL DATA:  Lethargy and difficulty breathing EXAM: PORTABLE CHEST 1 VIEW COMPARISON:  11/29/2015 FINDINGS: Cardiac shadow remains enlarged. A defibrillator is again seen and stable. Diffuse infiltrative changes are noted throughout both lungs left greater than right but stable from the prior exam. No new focal abnormality is noted. No bony abnormality is seen. IMPRESSION: Stable bilateral infiltrates left greater than right. Electronically Signed   By: Alcide Clever M.D.   On: 11/30/2015 09:41        Scheduled Meds: . apixaban  5 mg Oral BID  . aspirin EC  81 mg Oral Daily  . docusate sodium  100 mg Oral BID  . donepezil  10 mg Oral QHS  . famotidine  20 mg Oral Daily  . feeding supplement  1 Container Oral TID BM  . guaiFENesin  600 mg Oral BID  . loratadine  10 mg Oral Daily  . methylPREDNISolone (SOLU-MEDROL) injection  40 mg Intravenous BID  . mexiletine  200 mg Oral Q8H  . multivitamin with minerals  1 tablet Oral Daily  . ondansetron (ZOFRAN) IV  4 mg Intravenous Q4H  . polyethylene glycol  17 g Oral Daily  . ranolazine  500 mg Oral BID  . sodium chloride flush  10-40 mL Intracatheter Q12H  . sodium chloride flush  3 mL Intravenous Q12H   Continuous Infusions:    LOS: 4 days        [name] Annett Gula, MD [facility] Pager [phone]  If 7PM-7AM, please contact night-coverage [email or web address] Password [email or web address] 12/02/2015, 7:45 AM

## 2015-12-02 NOTE — Progress Notes (Signed)
Patient ID: [name], male   DOB: 1945-11-20, 70 y.o.   MRN: [id number]     Advanced Heart Failure Rounding Note  PCP: Crawford Givens, MD Primary Cardiologist/EP: Dr. [name]  Subjective:    Admitted 11/28/15 with a/c respiratory failure. Had VT with shock 11/29/15. EP following.   PICC placed 11/30/15 with concerns for low output. Initial coox 66%.  Possible neurological side effects from lidocaine, resolved off lidocaine.    No further VT over the last day.  Very weak.  Still on 10 L O2 by nasal cannula.  Getting Solumedrol, now off antibiotics.  CVP 4 today, co-ox 70%.    Objective:   Weight Range: 134 lb 11.2 oz (61.1 kg) Body mass index is 23.86 kg/m.   Vital Signs:   Temp:  [97.6 F (36.4 C)-98.3 F (36.8 C)] 97.6 F (36.4 C) (10/28 0451) Pulse Rate:  [58-68] 60 (10/28 0817) Resp:  [14-34] 19 (10/28 0817) BP: (90-129)/(54-90) 92/63 (10/28 0817) SpO2:  [90 %-100 %] 94 % (10/28 0817) Weight:  [134 lb 11.2 oz (61.1 kg)] 134 lb 11.2 oz (61.1 kg) (10/28 0400) Last BM Date: 12/01/15  Weight change: Filed Weights   11/30/15 0513 12/01/15 0600 12/02/15 0400  Weight: 140 lb 8 oz (63.7 kg) 136 lb 11 oz (62 kg) 134 lb 11.2 oz (61.1 kg)    Intake/Output:   Intake/Output Summary (Last 24 hours) at 12/02/15 0859 Last data filed at 12/02/15 0500  Gross per 24 hour  Intake             1117 ml  Output              601 ml  Net              516 ml     Physical Exam: CVP 4-5 General:  Chronically ill and fatigued. Sitting up in bed.  HEENT: Normal x for temporal wasting  Neck: supple. JVP 4-5 cm. Carotids 2+ bilat; no bruits. No thyromegaly or nodule noted.  Cor: PMI nondisplaced. Regular. 3/6 HSM apex.  Lungs: Diminished, basilar crackles Abdomen: soft, NT, ND, no HSM. No bruits or masses. +BS  Extremities: no cyanosis, clubbing, rash. No peripheral edema Neuro: alert & orientedx3, cranial nerves grossly intact. moves all 4 extremities w/o difficulty. Affect flat.    Telemetry: Reviewed, NSR 60s. No further VT.   Labs: CBC  Recent Labs  11/30/15 0440 12/01/15 0430  WBC 14.2* 13.5*  NEUTROABS 13.2* 12.4*  HGB 10.3* 9.9*  HCT 32.1* 30.5*  MCV 87.0 86.9  PLT 502* 495*   Basic Metabolic Panel  Recent Labs  12/01/15 0430 12/02/15 0430  NA 137 138  K 4.8 4.7  CL 98* 101  CO2 30 31  GLUCOSE 142* 168*  BUN 26* 32*  CREATININE 1.09 0.88  CALCIUM 7.6* 7.7*  MG 2.2 2.2   Liver Function Tests No results for input(s): AST, ALT, ALKPHOS, BILITOT, PROT, ALBUMIN in the last 72 hours. No results for input(s): LIPASE, AMYLASE in the last 72 hours. Cardiac Enzymes No results for input(s): CKTOTAL, CKMB, CKMBINDEX, TROPONINI in the last 72 hours.  BNP: BNP (last 3 results)  Recent Labs  09/27/15 0658 11/07/15 0253 11/28/15 0737  BNP 184.8* 184.2* 494.0*    ProBNP (last 3 results) No results for input(s): PROBNP in the last 8760 hours.   D-Dimer No results for input(s): DDIMER in the last 72 hours. Hemoglobin A1C No results for input(s): HGBA1C in the last 72 hours. Fasting  Lipid Panel No results for input(s): CHOL, HDL, LDLCALC, TRIG, CHOLHDL, LDLDIRECT in the last 72 hours. Thyroid Function Tests No results for input(s): TSH, T4TOTAL, T3FREE, THYROIDAB in the last 72 hours.  Invalid input(s): FREET3  Other results:     Imaging/Studies:  Dg Chest Port 1 View  Result Date: 12/01/2015 CLINICAL DATA:  Dyspnea.  Pneumonia. EXAM: PORTABLE CHEST 1 VIEW COMPARISON:  11/30/2015 FINDINGS: Left greater than right interstitial and airspace opacities are without change from the previous day's study. No new lung abnormalities. Cardiac silhouette is normal in size. No mediastinal or hilar masses. New right PICC has its tip in the lower superior vena cava. Left anterior chest wall AICD is stable. IMPRESSION: 1. No change in the left greater than right bilateral interstitial airspace lung opacities. No new lung abnormalities. 2. New  right PICC is well positioned with its tip in the lower superior vena cava. Electronically Signed   By: Amie Portland M.D.   On: 12/01/2015 08:49   Dg Chest Port 1 View  Result Date: 11/30/2015 CLINICAL DATA:  Lethargy and difficulty breathing EXAM: PORTABLE CHEST 1 VIEW COMPARISON:  11/29/2015 FINDINGS: Cardiac shadow remains enlarged. A defibrillator is again seen and stable. Diffuse infiltrative changes are noted throughout both lungs left greater than right but stable from the prior exam. No new focal abnormality is noted. No bony abnormality is seen. IMPRESSION: Stable bilateral infiltrates left greater than right. Electronically Signed   By: Alcide Clever M.D.   On: 11/30/2015 09:41    Latest Echo   Latest Cath   Medications:     Scheduled Medications: . apixaban  5 mg Oral BID  . aspirin EC  81 mg Oral Daily  . carvedilol  3.125 mg Oral BID WC  . docusate sodium  100 mg Oral BID  . donepezil  10 mg Oral QHS  . famotidine  20 mg Oral Daily  . feeding supplement  1 Container Oral TID BM  . guaiFENesin  600 mg Oral BID  . loratadine  10 mg Oral Daily  . methylPREDNISolone (SOLU-MEDROL) injection  40 mg Intravenous BID  . mexiletine  200 mg Oral Q8H  . multivitamin with minerals  1 tablet Oral Daily  . ondansetron (ZOFRAN) IV  4 mg Intravenous Q4H  . polyethylene glycol  17 g Oral Daily  . ranolazine  500 mg Oral BID  . sodium chloride flush  10-40 mL Intracatheter Q12H  . sodium chloride flush  3 mL Intravenous Q12H    Infusions:    PRN Medications: acetaminophen **OR** acetaminophen, benzonatate, diazepam, HYDROcodone-acetaminophen, levalbuterol, loperamide, metaxalone, ondansetron (ZOFRAN) IV, sodium chloride flush, traMADol, zolpidem   Assessment/Plan   1. Chronic systolic CHF: Ischemic cardiomyopathy.  Echo 10/17 with EF 25-30%, severe MR.  Has Medtronic ICD.  CVP 4, co-ox 70%.  SBP 90s-100s.  - With VT, will add low dose Coreg 3.125 mg bid.  - If BP remains  stable, low dose losartan as next step.  - Will need po Lasix eventually, hold off today with low CVP.   2. VT: Has Medtronic ICD.  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  Likely had lidocaine toxicity as well.  He is now on mexiletine + ranolazine. No more VT over the last day.  - Add low dose Coreg as above.  3. Acute hypoxic respiratory failure: Diffuse interstitial airspace disease on CXR.  CCM following.  Suspect amiodarone lung toxicity and not PNA or pulmonary edema.  CVP 4.  Still  with high oxygen requirement.  - Now off antibiotics.  - Continues on Solumedrol.  4. CAD: LHC 09/28/15 with chronic total occlusion of the proximal RCA and moderate disease in Distal LCx and third OM.  He does not tolerate statins.   5. Hyperthyroidism: ? 2/2 amiodarone toxicity.  Management per [facility].  6. Atrial fibrillation: Paroxysmal, currently in NSR.  Continue apixaban.  7. Debilitated, needs to get out of bed, PT consult.  Suspect he will need rehab stay.   Length of Stay: 4  Marca AnconaDalton Amadou Katzenstein 12/02/2015, 8:59 AM  Advanced Heart Failure Team Pager [phone] (M-F; 7a - 4p)  Please contact [facility] for night-coverage after hours (4p -7a ) and weekends on [email or web address]

## 2015-12-03 ENCOUNTER — Inpatient Hospital Stay (HOSPITAL_COMMUNITY): Payer: Medicare Other

## 2015-12-03 DIAGNOSIS — J984 Other disorders of lung: Secondary | ICD-10-CM

## 2015-12-03 LAB — BASIC METABOLIC PANEL
ANION GAP: 5 (ref 5–15)
BUN: 32 mg/dL — ABNORMAL HIGH (ref 6–20)
CALCIUM: 8 mg/dL — AB (ref 8.9–10.3)
CO2: 31 mmol/L (ref 22–32)
CREATININE: 0.85 mg/dL (ref 0.61–1.24)
Chloride: 105 mmol/L (ref 101–111)
GLUCOSE: 97 mg/dL (ref 65–99)
Potassium: 4.2 mmol/L (ref 3.5–5.1)
Sodium: 141 mmol/L (ref 135–145)

## 2015-12-03 LAB — COOXEMETRY PANEL
CARBOXYHEMOGLOBIN: 1.5 % (ref 0.5–1.5)
METHEMOGLOBIN: 1 % (ref 0.0–1.5)
O2 SAT: 62.3 %
TOTAL HEMOGLOBIN: 9.7 g/dL — AB (ref 12.0–16.0)

## 2015-12-03 LAB — CBC WITH DIFFERENTIAL/PLATELET
BASOS ABS: 0 10*3/uL (ref 0.0–0.1)
BASOS PCT: 0 %
EOS PCT: 0 %
Eosinophils Absolute: 0 10*3/uL (ref 0.0–0.7)
HCT: 30.7 % — ABNORMAL LOW (ref 39.0–52.0)
Hemoglobin: 9.5 g/dL — ABNORMAL LOW (ref 13.0–17.0)
Lymphocytes Relative: 9 %
Lymphs Abs: 1.3 10*3/uL (ref 0.7–4.0)
MCH: 27.4 pg (ref 26.0–34.0)
MCHC: 30.9 g/dL (ref 30.0–36.0)
MCV: 88.5 fL (ref 78.0–100.0)
MONO ABS: 1.3 10*3/uL — AB (ref 0.1–1.0)
MONOS PCT: 9 %
Neutro Abs: 12.4 10*3/uL — ABNORMAL HIGH (ref 1.7–7.7)
Neutrophils Relative %: 82 %
PLATELETS: 442 10*3/uL — AB (ref 150–400)
RBC: 3.47 MIL/uL — ABNORMAL LOW (ref 4.22–5.81)
RDW: 15.3 % (ref 11.5–15.5)
WBC: 15.1 10*3/uL — ABNORMAL HIGH (ref 4.0–10.5)

## 2015-12-03 LAB — MAGNESIUM: MAGNESIUM: 2.1 mg/dL (ref 1.7–2.4)

## 2015-12-03 LAB — GLUCOSE, CAPILLARY: Glucose-Capillary: 244 mg/dL — ABNORMAL HIGH (ref 65–99)

## 2015-12-03 MED ORDER — LOSARTAN POTASSIUM 25 MG PO TABS
12.5000 mg | ORAL_TABLET | Freq: Every day | ORAL | Status: DC
  Administered 2015-12-03 – 2015-12-06 (×4): 12.5 mg via ORAL
  Filled 2015-12-03 (×4): qty 1

## 2015-12-03 MED ORDER — PREDNISONE 20 MG PO TABS
60.0000 mg | ORAL_TABLET | Freq: Every day | ORAL | Status: DC
  Administered 2015-12-04 – 2015-12-06 (×3): 60 mg via ORAL
  Filled 2015-12-03 (×3): qty 3

## 2015-12-03 NOTE — Progress Notes (Signed)
Patient ID: [name], male   DOB: [birth year], 70 y.o.   MRN: [id number]     Advanced Heart Failure Rounding Note  PCP: Crawford Givens, MD Primary Cardiologist/EP: Dr. [name]  Subjective:    Admitted 11/28/15 with a/c respiratory failure. Had VT with shock 11/29/15. EP following.   PICC placed 11/30/15 with concerns for low output. Initial coox 66%.  Possible neurological side effects from lidocaine, resolved off lidocaine.    Appears to have had a couple of short runs of SVT but no definite VT.  Very weak.  Still on 10 L O2 by nasal cannula.  Getting Solumedrol, now off antibiotics.  CVP 5 today, co-ox 62%.    Objective:   Weight Range: 129 lb 6.4 oz (58.7 kg) Body mass index is 22.92 kg/m.   Vital Signs:   Temp:  [98.8 F (37.1 C)-99.7 F (37.6 C)] 98.8 F (37.1 C) (10/29 0500) Pulse Rate:  [61-70] 70 (10/29 0922) Resp:  [17-31] 18 (10/29 0922) BP: (106-116)/(67-85) 116/68 (10/29 0922) SpO2:  [91 %-98 %] 91 % (10/29 0943) Weight:  [129 lb 6.4 oz (58.7 kg)] 129 lb 6.4 oz (58.7 kg) (10/29 0500) Last BM Date: 12/03/15  Weight change: Filed Weights   12/01/15 0600 12/02/15 0400 12/03/15 0500  Weight: 136 lb 11 oz (62 kg) 134 lb 11.2 oz (61.1 kg) 129 lb 6.4 oz (58.7 kg)    Intake/Output:   Intake/Output Summary (Last 24 hours) at 12/03/15 1216 Last data filed at 12/03/15 1014  Gross per 24 hour  Intake             1340 ml  Output              500 ml  Net              840 ml     Physical Exam: CVP 5 General:  Chronically ill and fatigued. Sitting up in bed.  HEENT: Normal x for temporal wasting  Neck: supple. JVP not elevated. Carotids 2+ bilat; no bruits. No thyromegaly or nodule noted.  Cor: PMI nondisplaced. Regular. 3/6 HSM apex.  Lungs: Diminished, basilar crackles Abdomen: soft, NT, ND, no HSM. No bruits or masses. +BS  Extremities: no cyanosis, clubbing, rash. No peripheral edema Neuro: alert & orientedx3, cranial nerves grossly intact. moves all 4  extremities w/o difficulty. Affect flat.   Telemetry: Reviewed, NSR 60s. Short runs SVT (?atrial tachy), no definite VT.    Labs: CBC  Recent Labs  12/01/15 0430 12/03/15 0525  WBC 13.5* 15.1*  NEUTROABS 12.4* 12.4*  HGB 9.9* 9.5*  HCT 30.5* 30.7*  MCV 86.9 88.5  PLT 495* 442*   Basic Metabolic Panel  Recent Labs  12/02/15 0430 12/03/15 0525  NA 138 141  K 4.7 4.2  CL 101 105  CO2 31 31  GLUCOSE 168* 97  BUN 32* 32*  CREATININE 0.88 0.85  CALCIUM 7.7* 8.0*  MG 2.2 2.1   Liver Function Tests No results for input(s): AST, ALT, ALKPHOS, BILITOT, PROT, ALBUMIN in the last 72 hours. No results for input(s): LIPASE, AMYLASE in the last 72 hours. Cardiac Enzymes No results for input(s): CKTOTAL, CKMB, CKMBINDEX, TROPONINI in the last 72 hours.  BNP: BNP (last 3 results)  Recent Labs  09/27/15 0658 11/07/15 0253 11/28/15 0737  BNP 184.8* 184.2* 494.0*    ProBNP (last 3 results) No results for input(s): PROBNP in the last 8760 hours.   D-Dimer No results for input(s): DDIMER in the last 72  hours. Hemoglobin A1C No results for input(s): HGBA1C in the last 72 hours. Fasting Lipid Panel No results for input(s): CHOL, HDL, LDLCALC, TRIG, CHOLHDL, LDLDIRECT in the last 72 hours. Thyroid Function Tests No results for input(s): TSH, T4TOTAL, T3FREE, THYROIDAB in the last 72 hours.  Invalid input(s): FREET3  Other results:     Imaging/Studies:  Dg Chest Port 1 View  Result Date: 12/03/2015 CLINICAL DATA:  Acute respiratory failure.  Hypoxic. EXAM: PORTABLE CHEST 1 VIEW COMPARISON:  12/01/2015. FINDINGS: Stable enlarged cardiac silhouette and left subclavian AICD lead. The right PICC tip is in the superior vena cava. The interstitial markings are less prominent. Increased and interval patchy opacity throughout the left lung and in the right mid and lower lung zones. No visible pleural fluid. Mild left shoulder degenerative changes. IMPRESSION: 1. Progressive  and interval bilateral pneumonia or alveolar edema, greater on the left. 2. Improved interstitial pulmonary edema. 3. Stable cardiomegaly. Electronically Signed   By: Beckie SaltsSteven  Reid M.D.   On: 12/03/2015 10:16    Latest Echo   Latest Cath   Medications:     Scheduled Medications: . apixaban  5 mg Oral BID  . aspirin EC  81 mg Oral Daily  . carvedilol  3.125 mg Oral BID WC  . docusate sodium  100 mg Oral BID  . donepezil  10 mg Oral QHS  . famotidine  20 mg Oral Daily  . feeding supplement  1 Container Oral TID BM  . guaiFENesin  600 mg Oral BID  . loratadine  10 mg Oral Daily  . losartan  12.5 mg Oral Daily  . methylPREDNISolone (SOLU-MEDROL) injection  40 mg Intravenous BID  . mexiletine  200 mg Oral Q8H  . multivitamin with minerals  1 tablet Oral Daily  . ondansetron (ZOFRAN) IV  4 mg Intravenous Q4H  . polyethylene glycol  17 g Oral Daily  . [START ON 12/04/2015] predniSONE  60 mg Oral Q breakfast  . ranolazine  500 mg Oral BID  . sodium chloride flush  10-40 mL Intracatheter Q12H  . sodium chloride flush  3 mL Intravenous Q12H    Infusions:    PRN Medications: acetaminophen **OR** acetaminophen, benzonatate, diazepam, HYDROcodone-acetaminophen, levalbuterol, loperamide, metaxalone, ondansetron (ZOFRAN) IV, sodium chloride flush, traMADol, zolpidem   Assessment/Plan   1. Chronic systolic CHF: Ischemic cardiomyopathy.  Echo 10/17 with EF 25-30%, severe MR.  Has [employer] ICD.  CVP 5, co-ox 62%.  SBP 100s-110s. - Continue Coreg 3.125 mg bid. - Add losartan 12.5 daily.   - Will need po Lasix eventually, hold off today with low CVP.   2. VT: Has [employer] ICD.  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  Likely had lidocaine toxicity as well.  He is now on mexiletine + ranolazine. No more VT.  3. Acute hypoxic respiratory failure: Diffuse interstitial airspace disease on CXR.  CCM following.  Suspect amiodarone lung toxicity and not PNA or pulmonary  edema.  CVP 5.  Still with high oxygen requirement.  - Now off antibiotics.  - Continues on Solumedrol.  4. CAD: LHC 09/28/15 with chronic total occlusion of the proximal RCA and moderate disease in Distal LCx and third OM.  He does not tolerate statins.   5. Hyperthyroidism: ? 2/2 amiodarone toxicity.  Management per [facility].  6. Atrial fibrillation: Paroxysmal, currently in NSR.  Continue apixaban.  7. Debilitated, needs to get out of bed, PT consult.  Suspect he will need rehab stay.   Length of Stay:  5  Marca Ancona 12/03/2015, 12:16 PM  Advanced Heart Failure Team Pager [phone] (M-F; 7a - 4p)  Please contact [facility] for night-coverage after hours (4p -7a ) and weekends on [email or web address]

## 2015-12-03 NOTE — Progress Notes (Signed)
PROGRESS NOTE    [name]  KGM:[id number] DOB: [birth year] DOA: 11/28/2015 PCP: Crawford GivensGraham Duncan, MD    Brief Narrative:  70 yo male with systolic ischemic cardiomyopathy ef 25%, presented with dyspnea and syncope. Admitted with working diagnosis of pulmonary edema to rule out pneumonia. Developed VT and VF with syncope on the medical ward. 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 induced lung injury. On mexilitine ventricular arrhythmia has been controlled, antibiotics discontinued, on systemic steroids for lung injury. On high flow nasal cannula  Assessment & Plan:   Principal Problem:   Acute on chronic respiratory failure with hypoxia (HCC) Active Problems:   Hyperlipidemia   Amiodarone pulmonary toxicity   Acute on chronic systolic CHF (congestive heart failure) (HCC)   Ischemic cardiomyopathy   History of ventricular tachycardia   Chronic atrial fibrillation (HCC)   Memory loss   HCAP (healthcare-associated pneumonia)   AICD (automatic cardioverter/defibrillator) present   Physical deconditioning   Transaminitis   Anemia   Elevated troponin   Hypoxia   Malnutrition of moderate degree   Opacity of lung on imaging study   Acute hypoxemic respiratory failure (HCC)   1. VT/VF. Continue antiarrhythmic therapy with mexiletine 200 mg q 8 hours. Telemetry monitoring, patient transferred out of the step down unit. Tolerating well coreg, blood pressure systolic 116 and heart rate 60 to 70. Avoid hypoxemia.   3. Systolic ischemic heart failure. Severe reduction on LV function, down to 25%, ischemic cardiomyopathy. Patient on coreg, holding ace inh for now to prevent hypotension. Off furosemide for now.   4. Atrial fibrillation.Anticoagulation with apixban. Heart rate in the 60 to 70s. Patient has remain in sinus rhythm, with first degree AV block.  5. Pneumonia ruled out. Patient has remained afebrile, will continue to hold on antibiotics, leukocytosis  presumed to be due to steroids. Cultures no growth.   7. Hypoxic respiratory failure/ Amiodarone lung injury. Patient still on high oxygen requirements, at 10 LPM high flow nasal cannula with rapid desaturation when off supplemental oxygent, follow chest film, personally reviewed noted improved on left upper lobe infiltrate.    8. Anxiety.  Diazepam as needed, q 6 hours. No further confusion or agitation.   9. Hypokalemia.  K corrected up to 4,2. Will continue to hold on furosemide, follow renal panel in am  10. Sick euthyroid syndrome/ amiodarone toxicity. TSH low normal with elevated free T4. Clinically euthyroid.  Will continue observation, clinically not hyperthyroid. (amiodarone has been discontinued)   DVT prophylaxis:apixaban  Code Status:full  Family Communication:No family at the bedside   Consultants:   Cardiology   Pulmonary  EP  Procedures:   Antimicrobials:  Cefepime 10/24 to 10/28   Subjective: Patient feeling well, anxious to get out of bed and ambulate, no chest pain, dyspnea has been improving.   Objective: Vitals:   12/02/15 1550 12/02/15 1552 12/02/15 2100 12/03/15 0500  BP:  112/85 110/67 106/70  Pulse:  61 70 68  Resp:  17 (!) 28 18  Temp: 98.8 F (37.1 C)  99.7 F (37.6 C) 98.8 F (37.1 C)  TempSrc: Axillary     SpO2:  94% 92% 94%  Weight:    58.7 kg (129 lb 6.4 oz)  Height:        Intake/Output Summary (Last 24 hours) at 12/03/15 0813 Last data filed at 12/03/15 0530  Gross per 24 hour  Intake             1820 ml  Output  475 ml  Net             1345 ml   Filed Weights   12/01/15 0600 12/02/15 0400 12/03/15 0500  Weight: 62 kg (136 lb 11 oz) 61.1 kg (134 lb 11.2 oz) 58.7 kg (129 lb 6.4 oz)    Examination:  General exam: deconditioned, not in pain or dyspnea E ENT: Mild conjunctival pallor, oral mucosa moist. Respiratory system: scattered rales bilateral, diffuse, no rhonchi or wheezing. Respiratory effort  normal. Cardiovascular system: S1 & S2 heard, RRR. No JVD, murmurs, rubs, gallops or clicks. No pedal edema. Gastrointestinal system: Abdomen is nondistended, soft and nontender. No organomegaly or masses felt. Normal bowel sounds heard. Central nervous system: Alert and oriented. No focal neurological deficits. Extremities: Symmetric 5 x 5 power. Skin: No rashes, lesions or ulcers    Data Reviewed: I have personally reviewed following labs and imaging studies  CBC:  Recent Labs Lab 11/28/15 0258 11/29/15 0012 11/30/15 0440 12/01/15 0430 12/03/15 0525  WBC 10.0 9.2 14.2* 13.5* 15.1*  NEUTROABS 8.0*  --  13.2* 12.4* 12.4*  HGB 9.7* 10.2* 10.3* 9.9* 9.5*  HCT 30.8* 32.7* 32.1* 30.5* 30.7*  MCV 87.5 88.1 87.0 86.9 88.5  PLT 411* 475* 502* 495* 442*   Basic Metabolic Panel:  Recent Labs Lab 11/28/15 0800 11/29/15 0012 11/29/15 1430 11/30/15 0440 11/30/15 0810 12/01/15 0430 12/02/15 0430 12/03/15 0525  NA  --  137  --  136  --  137 138 141  K  --  4.2  --  3.6  --  4.8 4.7 4.2  CL  --  104  --  101  --  98* 101 105  CO2  --  25  --  27  --  30 31 31   GLUCOSE  --  178*  --  168*  --  142* 168* 97  BUN  --  14  --  24*  --  26* 32* 32*  CREATININE  --  0.92  --  0.98  --  1.09 0.88 0.85  CALCIUM  --  7.6*  --  7.4*  --  7.6* 7.7* 8.0*  MG 1.9  --  1.8  --  2.4 2.2 2.2 2.1  PHOS 2.5  --   --   --   --   --   --   --    GFR: Estimated Creatinine Clearance: 64.2 mL/min (by C-G formula based on SCr of 0.85 mg/dL). Liver Function Tests:  Recent Labs Lab 11/28/15 0258 11/29/15 0012  AST 109* 91*  ALT 80* 79*  ALKPHOS 76 80  BILITOT 1.1 1.0  PROT 5.0* 5.3*  ALBUMIN 2.0* 2.0*   No results for input(s): LIPASE, AMYLASE in the last 168 hours. No results for input(s): AMMONIA in the last 168 hours. Coagulation Profile: No results for input(s): INR, PROTIME in the last 168 hours. Cardiac Enzymes:  Recent Labs Lab 11/28/15 0258 11/28/15 1018  TROPONINI 0.05*  0.04*   BNP (last 3 results) No results for input(s): PROBNP in the last 8760 hours. HbA1C: No results for input(s): HGBA1C in the last 72 hours. CBG:  Recent Labs Lab 11/29/15 0915  GLUCAP 176*   Lipid Profile: No results for input(s): CHOL, HDL, LDLCALC, TRIG, CHOLHDL, LDLDIRECT in the last 72 hours. Thyroid Function Tests: No results for input(s): TSH, T4TOTAL, FREET4, T3FREE, THYROIDAB in the last 72 hours. Anemia Panel: No results for input(s): VITAMINB12, FOLATE, FERRITIN, TIBC, IRON, RETICCTPCT in the last 72 hours. Sepsis  Labs:  Recent Labs Lab 11/28/15 0301 11/28/15 0737 11/30/15 0440 12/02/15 0430  PROCALCITON  --  <0.10 <0.10 <0.10  LATICACIDVEN 1.14  --   --   --     Recent Results (from the past 240 hour(s))  MRSA PCR Screening     Status: None   Collection Time: 11/29/15  2:35 PM  Result Value Ref Range Status   MRSA by PCR NEGATIVE NEGATIVE Final    Comment:        The GeneXpert MRSA Assay (FDA approved for NASAL specimens only), is one component of a comprehensive MRSA colonization surveillance program. It is not intended to diagnose MRSA infection nor to guide or monitor treatment for MRSA infections.          Radiology Studies: Dg Chest Port 1 View  Result Date: 12/01/2015 CLINICAL DATA:  Dyspnea.  Pneumonia. EXAM: PORTABLE CHEST 1 VIEW COMPARISON:  11/30/2015 FINDINGS: Left greater than right interstitial and airspace opacities are without change from the previous day's study. No new lung abnormalities. Cardiac silhouette is normal in size. No mediastinal or hilar masses. New right PICC has its tip in the lower superior vena cava. Left anterior chest wall AICD is stable. IMPRESSION: 1. No change in the left greater than right bilateral interstitial airspace lung opacities. No new lung abnormalities. 2. New right PICC is well positioned with its tip in the lower superior vena cava. Electronically Signed   By: Amie Portland M.D.   On:  12/01/2015 08:49        Scheduled Meds: . apixaban  5 mg Oral BID  . aspirin EC  81 mg Oral Daily  . carvedilol  3.125 mg Oral BID WC  . docusate sodium  100 mg Oral BID  . donepezil  10 mg Oral QHS  . famotidine  20 mg Oral Daily  . feeding supplement  1 Container Oral TID BM  . guaiFENesin  600 mg Oral BID  . loratadine  10 mg Oral Daily  . methylPREDNISolone (SOLU-MEDROL) injection  40 mg Intravenous BID  . mexiletine  200 mg Oral Q8H  . multivitamin with minerals  1 tablet Oral Daily  . ondansetron (ZOFRAN) IV  4 mg Intravenous Q4H  . polyethylene glycol  17 g Oral Daily  . ranolazine  500 mg Oral BID  . sodium chloride flush  10-40 mL Intracatheter Q12H  . sodium chloride flush  3 mL Intravenous Q12H   Continuous Infusions:    LOS: 5 days        Kyrra Prada Annett Gula, MD [facility] Pager [phone]  If 7PM-7AM, please contact night-coverage [email or web address] Password [email or web address] 12/03/2015, 8:13 AM

## 2015-12-03 NOTE — Progress Notes (Signed)
Pt resting in bed, 02 sat's in the 60's-70's on 10 L high flow nasal cannula.  02 probe shows a good wave form. Pt denies feeling SOB and is alert and oriented.  A non rebreather was placed on patient and then  transitioned back to High Flow Nasal Cannula 10 L. Pt's 02 sat is in the low to mid 90's. MD on call made aware. New order received for a chest xray. Will cont to monitor pt.

## 2015-12-04 LAB — CBC WITH DIFFERENTIAL/PLATELET
BASOS ABS: 0 10*3/uL (ref 0.0–0.1)
BASOS PCT: 0 %
EOS ABS: 0 10*3/uL (ref 0.0–0.7)
Eosinophils Relative: 0 %
HEMATOCRIT: 32.2 % — AB (ref 39.0–52.0)
Hemoglobin: 10 g/dL — ABNORMAL LOW (ref 13.0–17.0)
Lymphocytes Relative: 4 %
Lymphs Abs: 0.5 10*3/uL — ABNORMAL LOW (ref 0.7–4.0)
MCH: 27.5 pg (ref 26.0–34.0)
MCHC: 31.1 g/dL (ref 30.0–36.0)
MCV: 88.5 fL (ref 78.0–100.0)
MONO ABS: 0.2 10*3/uL (ref 0.1–1.0)
Monocytes Relative: 2 %
NEUTROS ABS: 12.8 10*3/uL — AB (ref 1.7–7.7)
NEUTROS PCT: 94 %
Platelets: 500 10*3/uL — ABNORMAL HIGH (ref 150–400)
RBC: 3.64 MIL/uL — ABNORMAL LOW (ref 4.22–5.81)
RDW: 15 % (ref 11.5–15.5)
WBC: 13.6 10*3/uL — ABNORMAL HIGH (ref 4.0–10.5)

## 2015-12-04 LAB — BASIC METABOLIC PANEL
ANION GAP: 5 (ref 5–15)
BUN: 29 mg/dL — ABNORMAL HIGH (ref 6–20)
CALCIUM: 7.7 mg/dL — AB (ref 8.9–10.3)
CO2: 30 mmol/L (ref 22–32)
CREATININE: 0.83 mg/dL (ref 0.61–1.24)
Chloride: 102 mmol/L (ref 101–111)
Glucose, Bld: 167 mg/dL — ABNORMAL HIGH (ref 65–99)
Potassium: 4.9 mmol/L (ref 3.5–5.1)
SODIUM: 137 mmol/L (ref 135–145)

## 2015-12-04 LAB — MAGNESIUM: MAGNESIUM: 2.2 mg/dL (ref 1.7–2.4)

## 2015-12-04 LAB — COOXEMETRY PANEL
CARBOXYHEMOGLOBIN: 1.7 % — AB (ref 0.5–1.5)
METHEMOGLOBIN: 1.1 % (ref 0.0–1.5)
O2 SAT: 68.4 %
Total hemoglobin: 10.2 g/dL — ABNORMAL LOW (ref 12.0–16.0)

## 2015-12-04 NOTE — Progress Notes (Signed)
PROGRESS NOTE    [name]  TWK:[id number] DOB: 10-08-1945 DOA: 11/28/2015 PCP: Crawford Givens, MD    Brief Narrative:  70 yo male with systolic ischemic cardiomyopathy ef 25%, presented with dyspnea and syncope. Admitted with working diagnosis of pulmonary edema to rule out pneumonia. Developed VT and VF with syncope on the medical ward. 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 induced lung injury. On mexilitine ventricular arrhythmia has been controlled, antibiotics discontinued, on systemic steroids for lung injury. On high flow nasal cannula   Assessment & Plan:   Principal Problem:   Acute on chronic respiratory failure with hypoxia (HCC) Active Problems:   Hyperlipidemia   Amiodarone pulmonary toxicity   Acute on chronic systolic CHF (congestive heart failure) (HCC)   Ischemic cardiomyopathy   History of ventricular tachycardia   Chronic atrial fibrillation (HCC)   Memory loss   HCAP (healthcare-associated pneumonia)   AICD (automatic cardioverter/defibrillator) present   Physical deconditioning   Transaminitis   Anemia   Elevated troponin   Hypoxia   Malnutrition of moderate degree   Opacity of lung on imaging study   Acute hypoxemic respiratory failure (HCC)    1. VT/VF. On antiarrhythmic therapy with mexiletine 200 mg q 8 hours. Telemetry monitoring, no further arrhythmias.  Tolerating well coreg, blood pressure systolic 116 and heart rate 60 to 70. Avoid hypoxemia.   3. Systolic ischemic heart failure. Severe reduction on LV function, down to 25%, ischemic cardiomyopathy. Patient on coreg, started on losartan, and continue to hold on furosemide.   4. Atrial fibrillation.Anticoagulation with apixban. Heart rate in the 60 to 70s. Patient has remain in sinus rhythm, with first degree AV block. Out of bed as tolerated.   5. Hypoxic respiratory failure/Amiodarone lung injury. Patient still on high oxygen requirements, this am reduced to   6 LPM high flow nasal cannula. Will continue to wean as tolerated, target O2 sat above 92%. Now on po prednisone that will need to be tapered for the next 2 to 6 mo.    8. Anxiety. Diazepam as needed, q 6 hours. Has not use diazepam over last 3 days.   9. Hypokalemia. K at 4.9 will follow renal function in am.   10. Sick euthyroid syndrome/ amiodarone toxicity. Clinically euthyroid, will need outpatient TFT.    DVT prophylaxis:apixaban  Code Status:full  Family Communication:No family at the bedside   Consultants:  Cardiology   Pulmonary  EP  Procedures:  Antimicrobials:  Cefepime 10/24 to 10/28   Subjective: Patient is feeling better, no dyspnea or chest pain. No nausea or vomiting.   Objective: Vitals:   12/03/15 2100 12/04/15 0500 12/04/15 0530 12/04/15 0855  BP: 106/62  (!) 100/54 99/62  Pulse: 62 (!) 58 65 60  Resp: (!) 25 13 19 16   Temp: 98 F (36.7 C)  97.6 F (36.4 C)   TempSrc:      SpO2: 99% (!) 89% 98% 93%  Weight:   55.1 kg (121 lb 8 oz)   Height:        Intake/Output Summary (Last 24 hours) at 12/04/15 0936 Last data filed at 12/04/15 0530  Gross per 24 hour  Intake             1320 ml  Output              700 ml  Net              620 ml   Filed Weights   12/02/15 0400 12/03/15  0500 12/04/15 0530  Weight: 61.1 kg (134 lb 11.2 oz) 58.7 kg (129 lb 6.4 oz) 55.1 kg (121 lb 8 oz)    Examination:  General exam: not in pain or dyspnea. E ENT: mild pallor, oral mucosa moist.  Respiratory system: scattered rales bilaterally, no wheezing or rhonchi.   Cardiovascular system: S1 & S2 heard, RRR. No JVD, rubs, gallops or clicks. No pedal edema. Gastrointestinal system: Abdomen is nondistended, soft and nontender. No organomegaly or masses felt. Normal bowel sounds heard. Central nervous system: Alert and oriented. No focal neurological deficits. Extremities: Symmetric 5 x 5 power. Skin: No rashes, lesions or ulcers    Data Reviewed:  I have personally reviewed following labs and imaging studies  CBC:  Recent Labs Lab 11/28/15 0258 11/29/15 0012 11/30/15 0440 12/01/15 0430 12/03/15 0525 12/04/15 0456  WBC 10.0 9.2 14.2* 13.5* 15.1* 13.6*  NEUTROABS 8.0*  --  13.2* 12.4* 12.4* 12.8*  HGB 9.7* 10.2* 10.3* 9.9* 9.5* 10.0*  HCT 30.8* 32.7* 32.1* 30.5* 30.7* 32.2*  MCV 87.5 88.1 87.0 86.9 88.5 88.5  PLT 411* 475* 502* 495* 442* 500*   Basic Metabolic Panel:  Recent Labs Lab 11/28/15 0800  11/30/15 0440 11/30/15 0810 12/01/15 0430 12/02/15 0430 12/03/15 0525 12/04/15 0456  NA  --   < > 136  --  137 138 141 137  K  --   < > 3.6  --  4.8 4.7 4.2 4.9  CL  --   < > 101  --  98* 101 105 102  CO2  --   < > 27  --  30 31 31 30   GLUCOSE  --   < > 168*  --  142* 168* 97 167*  BUN  --   < > 24*  --  26* 32* 32* 29*  CREATININE  --   < > 0.98  --  1.09 0.88 0.85 0.83  CALCIUM  --   < > 7.4*  --  7.6* 7.7* 8.0* 7.7*  MG 1.9  < >  --  2.4 2.2 2.2 2.1 2.2  PHOS 2.5  --   --   --   --   --   --   --   < > = values in this interval not displayed. GFR: Estimated Creatinine Clearance: 63.6 mL/min (by C-G formula based on SCr of 0.83 mg/dL). Liver Function Tests:  Recent Labs Lab 11/28/15 0258 11/29/15 0012  AST 109* 91*  ALT 80* 79*  ALKPHOS 76 80  BILITOT 1.1 1.0  PROT 5.0* 5.3*  ALBUMIN 2.0* 2.0*   No results for input(s): LIPASE, AMYLASE in the last 168 hours. No results for input(s): AMMONIA in the last 168 hours. Coagulation Profile: No results for input(s): INR, PROTIME in the last 168 hours. Cardiac Enzymes:  Recent Labs Lab 11/28/15 0258 11/28/15 1018  TROPONINI 0.05* 0.04*   BNP (last 3 results) No results for input(s): PROBNP in the last 8760 hours. HbA1C: No results for input(s): HGBA1C in the last 72 hours. CBG:  Recent Labs Lab 11/29/15 0915 12/03/15 1627  GLUCAP 176* 244*   Lipid Profile: No results for input(s): CHOL, HDL, LDLCALC, TRIG, CHOLHDL, LDLDIRECT in the last 72  hours. Thyroid Function Tests: No results for input(s): TSH, T4TOTAL, FREET4, T3FREE, THYROIDAB in the last 72 hours. Anemia Panel: No results for input(s): VITAMINB12, FOLATE, FERRITIN, TIBC, IRON, RETICCTPCT in the last 72 hours. Sepsis Labs:  Recent Labs Lab 11/28/15 0301 11/28/15 [id number] 11/30/15 0440 12/02/15 0430  PROCALCITON  --  <0.10 <0.10 <0.10  LATICACIDVEN 1.14  --   --   --     Recent Results (from the past 240 hour(s))  MRSA PCR Screening     Status: None   Collection Time: 11/29/15  2:35 PM  Result Value Ref Range Status   MRSA by PCR NEGATIVE NEGATIVE Final    Comment:        The GeneXpert MRSA Assay (FDA approved for NASAL specimens only), is one component of a comprehensive MRSA colonization surveillance program. It is not intended to diagnose MRSA infection nor to guide or monitor treatment for MRSA infections.          Radiology Studies: Dg Chest Port 1 View  Result Date: 12/03/2015 CLINICAL DATA:  Acute respiratory failure.  Hypoxic. EXAM: PORTABLE CHEST 1 VIEW COMPARISON:  12/01/2015. FINDINGS: Stable enlarged cardiac silhouette and left subclavian AICD lead. The right PICC tip is in the superior vena cava. The interstitial markings are less prominent. Increased and interval patchy opacity throughout the left lung and in the right mid and lower lung zones. No visible pleural fluid. Mild left shoulder degenerative changes. IMPRESSION: 1. Progressive and interval bilateral pneumonia or alveolar edema, greater on the left. 2. Improved interstitial pulmonary edema. 3. Stable cardiomegaly. Electronically Signed   By: Beckie Salts M.D.   On: 12/03/2015 10:16        Scheduled Meds: . apixaban  5 mg Oral BID  . aspirin EC  81 mg Oral Daily  . carvedilol  3.125 mg Oral BID WC  . docusate sodium  100 mg Oral BID  . donepezil  10 mg Oral QHS  . famotidine  20 mg Oral Daily  . feeding supplement  1 Container Oral TID BM  . guaiFENesin  600 mg Oral BID    . loratadine  10 mg Oral Daily  . losartan  12.5 mg Oral Daily  . mexiletine  200 mg Oral Q8H  . multivitamin with minerals  1 tablet Oral Daily  . ondansetron (ZOFRAN) IV  4 mg Intravenous Q4H  . polyethylene glycol  17 g Oral Daily  . predniSONE  60 mg Oral Q breakfast  . ranolazine  500 mg Oral BID  . sodium chloride flush  10-40 mL Intracatheter Q12H  . sodium chloride flush  3 mL Intravenous Q12H   Continuous Infusions:    LOS: 6 days       Caylen Yardley Annett Gula, MD [facility] Pager [phone]  If 7PM-7AM, please contact night-coverage [email or web address] Password [email or web address] 12/04/2015, 9:36 AM

## 2015-12-04 NOTE — Care Management Important Message (Signed)
Important Message  Patient Details  Name: [name] MRN: [id number] Date of Birth: [birth year]   Medicare Important Message Given:  Yes    [name] 12/04/2015, 5:30 PM

## 2015-12-04 NOTE — Progress Notes (Signed)
Patient ID: [name], male   DOB: 1945/03/21, 70 y.o.   MRN: [id number]     Advanced Heart Failure Rounding Note  PCP: Crawford Givens, MD Primary Cardiologist/EP: Dr. [name]  Subjective:    Admitted 11/28/15 with a/c respiratory failure. Had VT with shock 11/29/15. EP following.   PICC placed 11/30/15 with concerns for low output. Initial coox 66%.  Possible neurological side effects from lidocaine, resolved off lidocaine.    Feels much better. Remains weak. Remains on 10L O2. CVP 4-5. Co-ox 68%   Objective:   Weight Range: 55.1 kg (121 lb 8 oz) Body mass index is 21.52 kg/m.   Vital Signs:   Temp:  [97.6 F (36.4 C)-98 F (36.7 C)] 97.6 F (36.4 C) (10/30 0530) Pulse Rate:  [58-65] 65 (10/30 1140) Resp:  [13-25] 14 (10/30 1140) BP: (98-106)/(54-62) 98/54 (10/30 1140) SpO2:  [89 %-99 %] 93 % (10/30 1140) Weight:  [55.1 kg (121 lb 8 oz)] 55.1 kg (121 lb 8 oz) (10/30 0530) Last BM Date: 12/03/15  Weight change: Filed Weights   12/02/15 0400 12/03/15 0500 12/04/15 0530  Weight: 61.1 kg (134 lb 11.2 oz) 58.7 kg (129 lb 6.4 oz) 55.1 kg (121 lb 8 oz)    Intake/Output:   Intake/Output Summary (Last 24 hours) at 12/04/15 1357 Last data filed at 12/04/15 1248  Gross per 24 hour  Intake             1380 ml  Output             1050 ml  Net              330 ml     Physical Exam: CVP 4-5 General:  Chronically ill and fatigued. Lying  in bed.  HEENT: Normal x for temporal wasting  Neck: supple. JVP not elevated. Carotids 2+ bilat; no bruits. No thyromegaly or nodule noted.  Cor: PMI nondisplaced. Regular. 3/6 HSM apex.  Lungs: mild crackles throughout Abdomen: soft, NT, ND, no HSM. No bruits or masses. +BS  Extremities: no cyanosis, clubbing, rash. No peripheral edema Neuro: alert & orientedx3, cranial nerves grossly intact. moves all 4 extremities w/o difficulty. Affect flat.   Telemetry: Reviewed, NSR 60s. No VT   Labs: CBC  Recent Labs  12/03/15 0525  12/04/15 0456  WBC 15.1* 13.6*  NEUTROABS 12.4* 12.8*  HGB 9.5* 10.0*  HCT 30.7* 32.2*  MCV 88.5 88.5  PLT 442* 500*   Basic Metabolic Panel  Recent Labs  12/03/15 0525 12/04/15 0456  NA 141 137  K 4.2 4.9  CL 105 102  CO2 31 30  GLUCOSE 97 167*  BUN 32* 29*  CREATININE 0.85 0.83  CALCIUM 8.0* 7.7*  MG 2.1 2.2   Liver Function Tests No results for input(s): AST, ALT, ALKPHOS, BILITOT, PROT, ALBUMIN in the last 72 hours. No results for input(s): LIPASE, AMYLASE in the last 72 hours. Cardiac Enzymes No results for input(s): CKTOTAL, CKMB, CKMBINDEX, TROPONINI in the last 72 hours.  BNP: BNP (last 3 results)  Recent Labs  09/27/15 0658 11/07/15 0253 11/28/15 0737  BNP 184.8* 184.2* 494.0*    ProBNP (last 3 results) No results for input(s): PROBNP in the last 8760 hours.   D-Dimer No results for input(s): DDIMER in the last 72 hours. Hemoglobin A1C No results for input(s): HGBA1C in the last 72 hours. Fasting Lipid Panel No results for input(s): CHOL, HDL, LDLCALC, TRIG, CHOLHDL, LDLDIRECT in the last 72 hours. Thyroid Function Tests No  results for input(s): TSH, T4TOTAL, T3FREE, THYROIDAB in the last 72 hours.  Invalid input(s): FREET3  Other results:     Imaging/Studies:  Dg Chest Port 1 View  Result Date: 12/03/2015 CLINICAL DATA:  Acute respiratory failure.  Hypoxic. EXAM: PORTABLE CHEST 1 VIEW COMPARISON:  12/01/2015. FINDINGS: Stable enlarged cardiac silhouette and left subclavian AICD lead. The right PICC tip is in the superior vena cava. The interstitial markings are less prominent. Increased and interval patchy opacity throughout the left lung and in the right mid and lower lung zones. No visible pleural fluid. Mild left shoulder degenerative changes. IMPRESSION: 1. Progressive and interval bilateral pneumonia or alveolar edema, greater on the left. 2. Improved interstitial pulmonary edema. 3. Stable cardiomegaly. Electronically Signed   By:  Beckie SaltsSteven  Reid M.D.   On: 12/03/2015 10:16    Latest Echo   Latest Cath   Medications:     Scheduled Medications: . apixaban  5 mg Oral BID  . aspirin EC  81 mg Oral Daily  . carvedilol  3.125 mg Oral BID WC  . docusate sodium  100 mg Oral BID  . donepezil  10 mg Oral QHS  . famotidine  20 mg Oral Daily  . feeding supplement  1 Container Oral TID BM  . guaiFENesin  600 mg Oral BID  . loratadine  10 mg Oral Daily  . losartan  12.5 mg Oral Daily  . mexiletine  200 mg Oral Q8H  . multivitamin with minerals  1 tablet Oral Daily  . ondansetron (ZOFRAN) IV  4 mg Intravenous Q4H  . polyethylene glycol  17 g Oral Daily  . predniSONE  60 mg Oral Q breakfast  . ranolazine  500 mg Oral BID  . sodium chloride flush  10-40 mL Intracatheter Q12H  . sodium chloride flush  3 mL Intravenous Q12H    Infusions:    PRN Medications: acetaminophen **OR** acetaminophen, benzonatate, diazepam, HYDROcodone-acetaminophen, levalbuterol, loperamide, metaxalone, ondansetron (ZOFRAN) IV, sodium chloride flush, traMADol, zolpidem   Assessment/Plan   1. Chronic systolic CHF: Ischemic cardiomyopathy.  Echo 10/17 with EF 25-30%, severe MR.  Has [employer]onic ICD.  CVP 5, co-ox 62%.  SBP 100s-110s. - Continue Coreg 3.125 mg bid. - Continue losartan 12.5 daily.   - Will need po Lasix eventually, hold off again today with low CVP.   2. VT: Has Medtronic ICD.  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  Likely had lidocaine toxicity as well.  He is now on mexiletine + ranolazine. No more VT.  3. Acute hypoxic respiratory failure: Diffuse interstitial airspace disease on CXR.  CCM following.  Suspect amiodarone lung toxicity and not PNA or pulmonary edema.  CVP 4-5.  Still with high oxygen requirement. I turned O2 down to 6L now 94-95% - Now off antibiotics.  - Continues on Solumedrol.  4. CAD: LHC 09/28/15 with chronic total occlusion of the proximal RCA and moderate disease in Distal LCx  and third OM.  He does not tolerate statins.   5. Hyperthyroidism: ? 2/2 amiodarone toxicity.  Management per [facility].  6. Atrial fibrillation: Paroxysmal, currently in NSR.  Continue apixaban.  7. Debilitated, needs to get out of bed, PT consult.  Suspect he will need rehab stay.   Length of Stay: 6  Jennalee Greaves MD 12/04/2015, 1:57 PM  Advanced Heart Failure Team Pager [phone] (M-F; 7a - 4p)  Please contact [facility] for night-coverage after hours (4p -7a ) and weekends on [email or web address]

## 2015-12-04 NOTE — Progress Notes (Signed)
Physical Therapy Treatment Patient Details Name: [name] MRN: [id number] DOB: 08-09-1945 Today's Date: 12/04/2015    History of Present Illness 70 y.o. male with extensive cardiac history and several recent admissions for HCAP which he failed several out patient antibody treatments.  Returns 10/24 with SOB, hypoxia and worsening diffuse bilateral infiltrates.    PT Comments    Patient progressing over last session.  Performed activities in standing for two trials with some assist for balance and cues for symptom management.  Patient with SpO2 down to 79% at lowest on high flow O2 @ 10L, but recovers with PLB seated in less than 2 minutes.  Patient would be a great candidate for CIR level rehab prior to returning home due to multiple recent hospitalizations with deconditioning and respiratory issues requiring high flow oxygen.  Will follow acutely.  Follow Up Recommendations  CIR     Equipment Recommendations  Rolling walker with 5" wheels    Recommendations for Other Services Rehab consult     Precautions / Restrictions Precautions Precautions: Fall    Mobility  Bed Mobility Overal bed mobility: Needs Assistance Bed Mobility: Supine to Sit     Supine to sit: Min assist;HOB elevated Sit to supine: Min assist   General bed mobility comments: assist to lift trunk to come upright, to supine assist for positioning and leg on bed  Transfers Overall transfer level: Needs assistance Equipment used: Rolling walker (2 wheeled) Transfers: Sit to/from Stand Sit to Stand: Min guard         General transfer comment: steadying assist, pt asked me not to help him  Ambulation/Gait             General Gait Details: unable due to lines   Stairs            Wheelchair Mobility    Modified Rankin (Stroke Patients Only)       Balance Overall balance assessment: Needs assistance         Standing balance support: No upper extremity supported;During  functional activity Standing balance-Leahy Scale: Poor Standing balance comment: min, mod A for balance while performing perineal hygiene in standing without UE support; educated need for one hand on walker for safety               High Level Balance Comments: also standing reaching for items on nightstand on R with min A with L UE supported on walker    Cognition Arousal/Alertness: Awake/alert Behavior During Therapy: WFL for tasks assessed/performed Overall Cognitive Status: Within Functional Limits for tasks assessed                      Exercises General Exercises - Lower Extremity Hip Flexion/Marching: Strengthening;Both;10 reps;Standing Heel Raises: Strengthening;Both;10 reps;Standing    General Comments        Pertinent Vitals/Pain Pain Assessment: No/denies pain    Home Living                      Prior Function            PT Goals (current goals can now be found in the care plan section) Progress towards PT goals: Progressing toward goals    Frequency    Min 3X/week      PT Plan Discharge plan needs to be updated    Co-evaluation             End of Session Equipment Utilized During Treatment: Gait belt;Oxygen Activity  Tolerance: Patient limited by fatigue Patient left: in bed;with call bell/phone within reach;with family/visitor present     Time: [id number] PT Time Calculation (min) (ACUTE ONLY): 30 min  Charges:  $Therapeutic Activity: 23-37 mins                    G Codes:      Elray McgregorCynthia Ellerie Arenz 12/04/2015, 4:07 PM  Sheran Lawlessyndi Minie Roadcap, PT [phone] 12/04/2015

## 2015-12-04 NOTE — Progress Notes (Signed)
Name: FREDDI SCHRAGER MRN: [id number] DOB: 05/23/1945    ADMISSION DATE:  11/28/2015 CONSULTATION DATE:  11/28/2015  REFERRING MD :  Hal Hope TRH  CHIEF COMPLAINT:  Hypoxia, diffuse infiltrates  BRIEF PATIENT DESCRIPTION: 70 y.o. male with extensive cardiac history and several recent admissions for HCAP which he failed several out patient antibiotic treatments.  Returns 10/24 with SOB, hypoxia and worsening diffuse bilateral infiltrates.  SUBJECTIVE:  Pt reports improvement in SOB, O2 weaned down to 6L HFNC.    VITAL SIGNS: Temp:  [97.6 F (36.4 C)-98 F (36.7 C)] 97.6 F (36.4 C) (10/30 0530) Pulse Rate:  [58-65] 60 (10/30 0855) Resp:  [13-25] 16 (10/30 0855) BP: (99-163)/(45-62) 99/62 (10/30 0855) SpO2:  [89 %-99 %] 93 % (10/30 0855) Weight:  [121 lb 8 oz (55.1 kg)] 121 lb 8 oz (55.1 kg) (10/30 0530)  PHYSICAL EXAMINATION: General:  No distress. Awake and alert. Wife at bedside. Neuro:  Alert and oriented 4. MAE. Grossly nonfocal. HEENT: Moist mucous membranes. No scleral icterus. Pupils symmetric. Cardiovascular: Regular rate. No edema. No appreciable JVD. Lungs: normal work of breathing, lungs bilaterally with faint Velcro-like crackles L>R Abdomen: Soft. Nondistended & nontender. Skin:  Warm and dry. No rash on exposed skin.   Recent Labs Lab 12/02/15 0430 12/03/15 0525 12/04/15 0456  NA 138 141 137  K 4.7 4.2 4.9  CL 101 105 102  CO2 '31 31 30  ' BUN 32* 32* 29*  CREATININE 0.88 0.85 0.83  GLUCOSE 168* 97 167*    Recent Labs Lab 12/01/15 0430 12/03/15 0525 12/04/15 0456  HGB 9.9* 9.5* 10.0*  HCT 30.5* 30.7* 32.2*  WBC 13.5* 15.1* 13.6*  PLT 495* 442* 500*   Dg Chest Port 1 View  Result Date: 12/03/2015 CLINICAL DATA:  Acute respiratory failure.  Hypoxic. EXAM: PORTABLE CHEST 1 VIEW COMPARISON:  12/01/2015. FINDINGS: Stable enlarged cardiac silhouette and left subclavian AICD lead. The right PICC tip is in the superior vena cava. The interstitial  markings are less prominent. Increased and interval patchy opacity throughout the left lung and in the right mid and lower lung zones. No visible pleural fluid. Mild left shoulder degenerative changes. IMPRESSION: 1. Progressive and interval bilateral pneumonia or alveolar edema, greater on the left. 2. Improved interstitial pulmonary edema. 3. Stable cardiomegaly. Electronically Signed   By: Claudie Revering M.D.   On: 12/03/2015 10:16   SIGNIFICANT EVENTS  08/23 - admit for AICD firing 10/02 - admit for HCAP 10/13 - Admit for HCAP 10/24 - admit for worsening airspace disease and persistent hypoxia. 10/25 - Transfer to ICU for arrhythmia 10/26 -  Patient taken off of amiodarone drip yesterday with hallucinations. Reports dyspnea is improving.    STUDIES:  LHC 8/24 >> Prox RCA lesion 100%, Mid cx 70%. EF 25% estimated. Continue medical therapy, no PCI. CT Chest 10/13 >> Multi focal infiltrate involving the lower lobes, left greater than right, and the left upper lobe is most likely an infectious or inflammatory process. Recommend short-term follow-up to ensure resolution. 4 mm nodule in the right upper lobe. Recommend attention on short-term follow-up. TTE 10/26 >> EF 25-30% w/ akinesis of inferolateral & inferior myocardium. Grade 2 diastolic dysfunction. RV normal in size and function. CP  SEROLOGIES: CRP: 10.9 ESR: 40 PR-3: <3.5 DS DNA Ab: <1 MPO: <9 RF: 12.1 C3: 78 C4: 9 ANA:  Negative ANCA:  Negative Anti-CCP:  19   ASSESSMENT / PLAN:  70 y.o. male with extensive cardiac history and acute hypoxic  respiratory failure. Patient's oxygen requirement is steadily improving. I am cautious to de-escalate steroid therapy at this time given his previously marginal status. Remained highly suspicious that this is all secondary to amiodarone lung toxicity. Patient's rheumatoid factor and anti-CCP are weakly positive which could suggest an autoimmune etiology to the findings on his CT  imaging. This will need to be reassessed as the patient stabilizes further with repeat serologies and further evaluation by rheumatology as an outpatient.  1. Acute hypoxic respiratory failure: Continue to wean FiO2 for saturation greater than 92%.  2. 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 Solu-Medrol 40 mg IV every 12 hours.  Will hold             here for now as he has improved on current dose.  Consider reduction in am / transition to oral.  Will         need pulmonary follow up as outpatient.  3. Possible pneumonia: This is doubtful. Monitor off abx.   Noe Gens, NP-C [facility] Pgr: [phone] or if no answer [phone] 12/04/2015, 12:12 PM

## 2015-12-04 NOTE — Progress Notes (Signed)
SUBJECTIVE: The patient is doing well today.  At this time, he denies chest pain, shortness of breath, or any new concerns.  CURRENT MEDICATIONS: . apixaban  5 mg Oral BID  . aspirin EC  81 mg Oral Daily  . carvedilol  3.125 mg Oral BID WC  . docusate sodium  100 mg Oral BID  . donepezil  10 mg Oral QHS  . famotidine  20 mg Oral Daily  . feeding supplement  1 Container Oral TID BM  . guaiFENesin  600 mg Oral BID  . loratadine  10 mg Oral Daily  . losartan  12.5 mg Oral Daily  . mexiletine  200 mg Oral Q8H  . multivitamin with minerals  1 tablet Oral Daily  . ondansetron (ZOFRAN) IV  4 mg Intravenous Q4H  . polyethylene glycol  17 g Oral Daily  . predniSONE  60 mg Oral Q breakfast  . ranolazine  500 mg Oral BID  . sodium chloride flush  10-40 mL Intracatheter Q12H  . sodium chloride flush  3 mL Intravenous Q12H      OBJECTIVE: Physical Exam: Vitals:   12/03/15 1612 12/03/15 2100 12/04/15 0500 12/04/15 0530  BP: (!) 104/59 106/62  (!) 100/54  Pulse: 61 62 (!) 58 65  Resp: 19 (!) 25 13 19   Temp:  98 F (36.7 C)  97.6 F (36.4 C)  TempSrc:      SpO2: 93% 99% (!) 89% 98%  Weight:    121 lb 8 oz (55.1 kg)  Height:        Intake/Output Summary (Last 24 hours) at 12/04/15 0749 Last data filed at 12/04/15 0530  Gross per 24 hour  Intake             1320 ml  Output              800 ml  Net              520 ml    Telemetry reveals sinus rhythm with NSVT  GEN- The patient is elderly and chronically ill appearing, alert and oriented x 3 today.   Head- normocephalic, atraumatic Eyes-  Sclera clear, conjunctiva pink Ears- hearing intact Oropharynx- clear Neck- supple  Lungs- Clear to ausculation bilaterally, normal work of breathing Heart- Regular rate and rhythm  GI- soft, NT, ND, + BS Extremities- no clubbing, cyanosis, or edema Skin- no rash or lesion Psych- euthymic mood, full affect Neuro- strength and sensation are intact  LABS: Basic Metabolic  Panel:  Recent Labs  12/03/15 0525 12/04/15 0456  NA 141 137  K 4.2 4.9  CL 105 102  CO2 31 30  GLUCOSE 97 167*  BUN 32* 29*  CREATININE 0.85 0.83  CALCIUM 8.0* 7.7*  MG 2.1 2.2   CBC:  Recent Labs  12/03/15 0525 12/04/15 0456  WBC 15.1* 13.6*  NEUTROABS 12.4* 12.8*  HGB 9.5* 10.0*  HCT 30.7* 32.2*  MCV 88.5 88.5  PLT 442* 500*    RADIOLOGY: Dg Chest 2 View Result Date: 11/28/2015 CLINICAL DATA:  Shortness of breath EXAM: CHEST  2 VIEW COMPARISON:  11/24/2015, 03/06/2011, CT 11/17/2015 FINDINGS: Left-sided single lead ICD. Lead placement similar compared to previous. There is cardiomegaly. Interval increase in interstitial opacities asymmetric in the left thorax. Slight increased interstitial opacities within the right lung base. Atherosclerosis of the aorta. No pneumothorax. IMPRESSION: 1. Increased interstitial and alveolar opacities within the left greater then right chest since the previous exam. 2. Stable degree  of cardiomegaly. Electronically Signed   By: Jasmine PangKim  Fujinaga M.D.   On: 11/28/2015 04:38   ASSESSMENT AND PLAN:  Principal Problem:   Acute on chronic respiratory failure with hypoxia (HCC) Active Problems:   Hyperlipidemia   Amiodarone pulmonary toxicity   Acute on chronic systolic CHF (congestive heart failure) (HCC)   Ischemic cardiomyopathy   History of ventricular tachycardia   Chronic atrial fibrillation (HCC)   Memory loss   HCAP (healthcare-associated pneumonia)   AICD (automatic cardioverter/defibrillator) present   Physical deconditioning   Transaminitis   Anemia   Elevated troponin   Hypoxia   Malnutrition of moderate degree   Opacity of lung on imaging study   Acute hypoxemic respiratory failure (HCC)  1.  Ventricular tachycardia No recurrent sustained VT Continue Mexiletine and Ranolazine K and Mg stable  2.  Chronic systolic heart failure Continue current therapy Appreciate management by AHF team Wyvonne Carda need to be careful with BB  as he has underlying conduction system disease and has required V pacing in the past with amiodarone. He does not have atrial lead.   3.  Acute hypoxic respiratory failure Suspect amio lung toxicity Appreciate PCCM management  4.  Paroxysmal atrial fibrillation Currently in SR May recur as amiodarone washes out Continue Eliquis [facility] He has had prior GI bleeding, CBC stable this admission  Gypsy BalsamAmber Seiler, NP 12/04/2015 7:54 AM    I have seen and examined this patient with Gypsy BalsamAmber Seiler.  Agree with above, note added to reflect my findings.  On exam, regular rhythm, no murmurs, lungs clear. Tolerating mexiletine and ranolazine wtihout issues.  Continue current antiarrhythmic medications.     Yasemin Rabon M. Omari Koslosky MD 12/04/2015 1:26 PM

## 2015-12-04 NOTE — Progress Notes (Signed)
I met with pt and his wife at bedside to discuss a possible inpt rehab admission pending bed available when pt medically ready for d/c. I will follow daily. [phone]

## 2015-12-05 ENCOUNTER — Inpatient Hospital Stay (HOSPITAL_COMMUNITY): Payer: Medicare Other

## 2015-12-05 LAB — CBC WITH DIFFERENTIAL/PLATELET
BASOS ABS: 0 10*3/uL (ref 0.0–0.1)
Basophils Relative: 0 %
EOS PCT: 0 %
Eosinophils Absolute: 0.1 10*3/uL (ref 0.0–0.7)
HCT: 30 % — ABNORMAL LOW (ref 39.0–52.0)
HEMOGLOBIN: 9.3 g/dL — AB (ref 13.0–17.0)
LYMPHS PCT: 9 %
Lymphs Abs: 1.3 10*3/uL (ref 0.7–4.0)
MCH: 27.9 pg (ref 26.0–34.0)
MCHC: 31 g/dL (ref 30.0–36.0)
MCV: 90.1 fL (ref 78.0–100.0)
Monocytes Absolute: 1.4 10*3/uL — ABNORMAL HIGH (ref 0.1–1.0)
Monocytes Relative: 10 %
NEUTROS ABS: 12.4 10*3/uL — AB (ref 1.7–7.7)
NEUTROS PCT: 81 %
PLATELETS: 428 10*3/uL — AB (ref 150–400)
RBC: 3.33 MIL/uL — AB (ref 4.22–5.81)
RDW: 15.4 % (ref 11.5–15.5)
WBC: 15.2 10*3/uL — AB (ref 4.0–10.5)

## 2015-12-05 LAB — MAGNESIUM: Magnesium: 2 mg/dL (ref 1.7–2.4)

## 2015-12-05 LAB — BASIC METABOLIC PANEL
ANION GAP: 6 (ref 5–15)
BUN: 27 mg/dL — AB (ref 6–20)
CHLORIDE: 105 mmol/L (ref 101–111)
CO2: 30 mmol/L (ref 22–32)
Calcium: 7.6 mg/dL — ABNORMAL LOW (ref 8.9–10.3)
Creatinine, Ser: 0.84 mg/dL (ref 0.61–1.24)
Glucose, Bld: 88 mg/dL (ref 65–99)
POTASSIUM: 4.1 mmol/L (ref 3.5–5.1)
SODIUM: 141 mmol/L (ref 135–145)

## 2015-12-05 LAB — COOXEMETRY PANEL
Carboxyhemoglobin: 1.7 % — ABNORMAL HIGH (ref 0.5–1.5)
METHEMOGLOBIN: 1.1 % (ref 0.0–1.5)
O2 Saturation: 73 %
Total hemoglobin: 8.9 g/dL — ABNORMAL LOW (ref 12.0–16.0)

## 2015-12-05 NOTE — Progress Notes (Signed)
Patient ID: [name], male   DOB: 1945-05-09, 70 y.o.   MRN: [id number]     Advanced Heart Failure Rounding Note  PCP: Crawford Givens, MD Primary Cardiologist/EP: Dr. [name]  Subjective:    Admitted 11/28/15 with a/c respiratory failure. Had VT with shock 11/29/15. EP following.   PICC placed 11/30/15 with concerns for low output. Initial coox 66%.  Possible neurological side effects from lidocaine, resolved off lidocaine.    Feeling better. O2 turned down to 6L yesterday and did well. However sats down to 70-80s while sleeping this am. Placed on NRB. Now feels well. No further VT.CVP 3 off lasix. Co-ox 68% yesterday   Objective:   Weight Range: 55.1 kg (121 lb 8 oz) Body mass index is 21.52 kg/m.   Vital Signs:   Temp:  [97.6 F (36.4 C)-98.3 F (36.8 C)] 97.7 F (36.5 C) (10/30 2346) Pulse Rate:  [58-65] 58 (10/30 2100) Resp:  [14-19] 18 (10/30 2100) BP: (98-113)/(54-68) 113/63 (10/30 2346) SpO2:  [92 %-98 %] 92 % (10/30 2100) Weight:  [55.1 kg (121 lb 8 oz)] 55.1 kg (121 lb 8 oz) (10/30 0530) Last BM Date: 12/04/15  Weight change: Filed Weights   12/02/15 0400 12/03/15 0500 12/04/15 0530  Weight: 61.1 kg (134 lb 11.2 oz) 58.7 kg (129 lb 6.4 oz) 55.1 kg (121 lb 8 oz)    Intake/Output:   Intake/Output Summary (Last 24 hours) at 12/05/15 0512 Last data filed at 12/04/15 2100  Gross per 24 hour  Intake              420 ml  Output              750 ml  Net             -330 ml     Physical Exam: CVP 3 General:  Chronically ill and fatigued. Lying  in bed.  HEENT: Normal x for temporal wasting  Neck: supple. JVP not elevated. Carotids 2+ bilat; no bruits. No thyromegaly or nodule noted.  Cor: PMI nondisplaced. Regular. 3/6 HSM apex.  Lungs: minimal crackles Abdomen: soft, NT, ND, no HSM. No bruits or masses. +BS  Extremities: no cyanosis, clubbing, rash. No peripheral edema Neuro: alert & orientedx3, cranial nerves grossly intact. moves all 4 extremities  w/o difficulty. Affect flat.   Telemetry: Reviewed, NSR 60s. No VT   Labs: CBC  Recent Labs  12/03/15 0525 12/04/15 0456  WBC 15.1* 13.6*  NEUTROABS 12.4* 12.8*  HGB 9.5* 10.0*  HCT 30.7* 32.2*  MCV 88.5 88.5  PLT 442* 500*   Basic Metabolic Panel  Recent Labs  12/03/15 0525 12/04/15 0456  NA 141 137  K 4.2 4.9  CL 105 102  CO2 31 30  GLUCOSE 97 167*  BUN 32* 29*  CREATININE 0.85 0.83  CALCIUM 8.0* 7.7*  MG 2.1 2.2   Liver Function Tests No results for input(s): AST, ALT, ALKPHOS, BILITOT, PROT, ALBUMIN in the last 72 hours. No results for input(s): LIPASE, AMYLASE in the last 72 hours. Cardiac Enzymes No results for input(s): CKTOTAL, CKMB, CKMBINDEX, TROPONINI in the last 72 hours.  BNP: BNP (last 3 results)  Recent Labs  09/27/15 0658 11/07/15 0253 11/28/15 0737  BNP 184.8* 184.2* 494.0*    ProBNP (last 3 results) No results for input(s): PROBNP in the last 8760 hours.   D-Dimer No results for input(s): DDIMER in the last 72 hours. Hemoglobin A1C No results for input(s): HGBA1C in the last 72 hours.  Fasting Lipid Panel No results for input(s): CHOL, HDL, LDLCALC, TRIG, CHOLHDL, LDLDIRECT in the last 72 hours. Thyroid Function Tests No results for input(s): TSH, T4TOTAL, T3FREE, THYROIDAB in the last 72 hours.  Invalid input(s): FREET3  Other results:     Imaging/Studies:  Dg Chest Port 1 View  Result Date: 12/03/2015 CLINICAL DATA:  Acute respiratory failure.  Hypoxic. EXAM: PORTABLE CHEST 1 VIEW COMPARISON:  12/01/2015. FINDINGS: Stable enlarged cardiac silhouette and left subclavian AICD lead. The right PICC tip is in the superior vena cava. The interstitial markings are less prominent. Increased and interval patchy opacity throughout the left lung and in the right mid and lower lung zones. No visible pleural fluid. Mild left shoulder degenerative changes. IMPRESSION: 1. Progressive and interval bilateral pneumonia or alveolar edema,  greater on the left. 2. Improved interstitial pulmonary edema. 3. Stable cardiomegaly. Electronically Signed   By: Beckie SaltsSteven  Reid M.D.   On: 12/03/2015 10:16    Latest Echo   Latest Cath   Medications:     Scheduled Medications: . apixaban  5 mg Oral BID  . aspirin EC  81 mg Oral Daily  . carvedilol  3.125 mg Oral BID WC  . docusate sodium  100 mg Oral BID  . donepezil  10 mg Oral QHS  . famotidine  20 mg Oral Daily  . feeding supplement  1 Container Oral TID BM  . guaiFENesin  600 mg Oral BID  . loratadine  10 mg Oral Daily  . losartan  12.5 mg Oral Daily  . mexiletine  200 mg Oral Q8H  . multivitamin with minerals  1 tablet Oral Daily  . ondansetron (ZOFRAN) IV  4 mg Intravenous Q4H  . polyethylene glycol  17 g Oral Daily  . predniSONE  60 mg Oral Q breakfast  . ranolazine  500 mg Oral BID  . sodium chloride flush  10-40 mL Intracatheter Q12H  . sodium chloride flush  3 mL Intravenous Q12H    Infusions:    PRN Medications: acetaminophen **OR** acetaminophen, benzonatate, diazepam, HYDROcodone-acetaminophen, levalbuterol, loperamide, metaxalone, ondansetron (ZOFRAN) IV, sodium chloride flush, traMADol, zolpidem   Assessment/Plan   1. Chronic systolic CHF: Ischemic cardiomyopathy.  Echo 10/17 with EF 25-30%, severe MR.  Has [employer] ICD.  CVP 3, co-ox 60%.  SBP 100s-110s. - Continue Coreg 3.125 mg bid. - Continue losartan 12.5 daily.   - Will need po Lasix eventually, hold off again today with low CVP.   2. VT: Has Medtronic ICD.  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  Likely had lidocaine toxicity as well.  He is now on mexiletine + ranolazine. No more VT.  3. Acute hypoxic respiratory failure: Diffuse interstitial airspace disease on CXR.  CCM following.  Suspect amiodarone lung toxicity and not PNA or pulmonary edema.  CVP 4-5.  Still with high oxygen requirement. I turned O2 down to 6L now 94-95% - Now off antibiotics.  - Continues on  Solumedrol.  4. CAD: LHC 09/28/15 with chronic total occlusion of the proximal RCA and moderate disease in Distal LCx and third OM.  He does not tolerate statins.   5. Hyperthyroidism: ? 2/2 amiodarone toxicity.  Management per [facility].  6. Atrial fibrillation: Paroxysmal, currently in NSR.  Continue apixaban.  7. Debilitated, needs to get out of bed, PT consult.  Suspect he will need rehab stay.   Stable from HF standpoint. Main issue remains his respiratory status due to presumed amio toxicity. Pulmonary and EP managing. Will need  to restart lasix eventually but not at this point. HF team will follow at a distance.   Length of Stay: 7  Bensimhon, Daniel MD 12/05/2015, 5:12 AM  Advanced Heart Failure Team Pager [phone] (M-F; 7a - 4p)  Please contact [facility] for night-coverage after hours (4p -7a ) and weekends on [email or web address]

## 2015-12-05 NOTE — Progress Notes (Signed)
PROGRESS NOTE    [name]  FGH:[id number] DOB: 07/22/1945 DOA: 11/28/2015 PCP: Crawford Givens, MD   Brief Narrative:  70 yo male with systolic ischemic cardiomyopathy ef 25%, presented with dyspnea and syncope. Admitted with working diagnosis of pulmonary edema to rule out pneumonia. Developed VT and VF with syncope on the medical ward. 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 induced lung injury. On mexilitine ventricular arrhythmia has been controlled, antibiotics discontinued, on systemic steroids for lung injury. On high flow nasal cannula   Assessment & Plan:   Principal Problem:   Acute on chronic respiratory failure with hypoxia (HCC) Active Problems:   Hyperlipidemia   Amiodarone pulmonary toxicity   Acute on chronic systolic CHF (congestive heart failure) (HCC)   Ischemic cardiomyopathy   History of ventricular tachycardia   Chronic atrial fibrillation (HCC)   Memory loss   HCAP (healthcare-associated pneumonia)   AICD (automatic cardioverter/defibrillator) present   Physical deconditioning   Transaminitis   Anemia   Elevated troponin   Hypoxia   Malnutrition of moderate degree   Opacity of lung on imaging study   Acute hypoxemic respiratory failure (HCC)   1. VT/VF.On antiarrhythmic therapy with mexiletine 200 mg q 8 hours. No further arrhythmias. On coreg and losartan.   3. Systolic ischemic heart failure. Severe reduction on LV function,down to 25%, ischemic cardiomyopathy. Tolerating well losartan and coreg, blood pressure 93 to 110.  Will plan to resume furosemide at discharge clinically now euvolemic. Patient on ranexa.   4. Atrial fibrillation.Anticoagulation with apixban. Heart rate in the 56 to 66. Patient has remain in sinus rhythm, with first degree AV block. Out of bed as tolerated. No palpitations or chest pain.   5. Hypoxic respiratory failure/Amiodarone lung injury. High oxygen requirements. Patient on high flow nasal  cannula at 10 LPM, will continue to wean as tolerated to target 02 sat above 92%. Will continue prednisone 60 mg po per day,  will need to be tapered for the next 2 to 6 mo. Follow as outpatient with pulmonary.   8. Anxiety. No confusion or agitatin will continue Diazepam as needed, q 6 hours.   9. Hypokalemia. K has been corrected to 4,1. Will continue to hold on furosemide, for now.   10. Sick euthyroid syndrome/ amiodarone toxicity. Clinically euthyroid, will need outpatient TFT.   DVT prophylaxis:apixaban  Code Status:full  Family Communication:No family at the bedside   Consultants:  Cardiology   Pulmonary  EP  Procedures:  Antimicrobials:  Cefepime 10/24 to 10/28   Subjective: Patient feeling well, able to do physical therapy, still on high flow nasal cannula at 10 LPM. No chest pain or worsening dyspnea, no palpitations.   Objective: Vitals:   12/04/15 2346 12/05/15 0500 12/05/15 0734 12/05/15 0813  BP: 113/63 111/68 111/67   Pulse:  72 (!) 56 73  Resp:  11 15   Temp: 97.7 F (36.5 C) 98.2 F (36.8 C) 97.8 F (36.6 C)   TempSrc: Axillary  Axillary   SpO2:  90% 95%   Weight:  62.9 kg (138 lb 11.2 oz)    Height:        Intake/Output Summary (Last 24 hours) at 12/05/15 0912 Last data filed at 12/05/15 0556  Gross per 24 hour  Intake              480 ml  Output              875 ml  Net             -  395 ml   Filed Weights   12/03/15 0500 12/04/15 0530 12/05/15 0500  Weight: 58.7 kg (129 lb 6.4 oz) 55.1 kg (121 lb 8 oz) 62.9 kg (138 lb 11.2 oz)    Examination:  General exam: not in pain or dyspnea.  Respiratory system: scattered rales, no wheezing or rhonchi. Respiratory effort normal. Cardiovascular system: S1 & S2 heard, RRR. No JVD rubs, gallops or clicks. No pedal edema. Gastrointestinal system: Abdomen is nondistended, soft and nontender. No organomegaly or masses felt. Normal bowel sounds heard. Central nervous system: Alert and  oriented. No focal neurological deficits. Extremities: Symmetric 5 x 5 power. Skin: No rashes, lesions or ulcers      Data Reviewed: I have personally reviewed following labs and imaging studies  CBC:  Recent Labs Lab 11/30/15 0440 12/01/15 0430 12/03/15 0525 12/04/15 0456 12/05/15 0515  WBC 14.2* 13.5* 15.1* 13.6* 15.2*  NEUTROABS 13.2* 12.4* 12.4* 12.8* 12.4*  HGB 10.3* 9.9* 9.5* 10.0* 9.3*  HCT 32.1* 30.5* 30.7* 32.2* 30.0*  MCV 87.0 86.9 88.5 88.5 90.1  PLT 502* 495* 442* 500* 428*   Basic Metabolic Panel:  Recent Labs Lab 12/01/15 0430 12/02/15 0430 12/03/15 0525 12/04/15 0456 12/05/15 0515  NA 137 138 141 137 141  K 4.8 4.7 4.2 4.9 4.1  CL 98* 101 105 102 105  CO2 30 31 31 30 30   GLUCOSE 142* 168* 97 167* 88  BUN 26* 32* 32* 29* 27*  CREATININE 1.09 0.88 0.85 0.83 0.84  CALCIUM 7.6* 7.7* 8.0* 7.7* 7.6*  MG 2.2 2.2 2.1 2.2 2.0   GFR: Estimated Creatinine Clearance: 64.9 mL/min (by C-G formula based on SCr of 0.84 mg/dL). Liver Function Tests:  Recent Labs Lab 11/29/15 0012  AST 91*  ALT 79*  ALKPHOS 80  BILITOT 1.0  PROT 5.3*  ALBUMIN 2.0*   No results for input(s): LIPASE, AMYLASE in the last 168 hours. No results for input(s): AMMONIA in the last 168 hours. Coagulation Profile: No results for input(s): INR, PROTIME in the last 168 hours. Cardiac Enzymes:  Recent Labs Lab 11/28/15 1018  TROPONINI 0.04*   BNP (last 3 results) No results for input(s): PROBNP in the last 8760 hours. HbA1C: No results for input(s): HGBA1C in the last 72 hours. CBG:  Recent Labs Lab 11/29/15 0915 12/03/15 1627  GLUCAP 176* 244*   Lipid Profile: No results for input(s): CHOL, HDL, LDLCALC, TRIG, CHOLHDL, LDLDIRECT in the last 72 hours. Thyroid Function Tests: No results for input(s): TSH, T4TOTAL, FREET4, T3FREE, THYROIDAB in the last 72 hours. Anemia Panel: No results for input(s): VITAMINB12, FOLATE, FERRITIN, TIBC, IRON, RETICCTPCT in the last  72 hours. Sepsis Labs:  Recent Labs Lab 11/30/15 0440 12/02/15 0430  PROCALCITON <0.10 <0.10    Recent Results (from the past 240 hour(s))  MRSA PCR Screening     Status: None   Collection Time: 11/29/15  2:35 PM  Result Value Ref Range Status   MRSA by PCR NEGATIVE NEGATIVE Final    Comment:        The GeneXpert MRSA Assay (FDA approved for NASAL specimens only), is one component of a comprehensive MRSA colonization surveillance program. It is not intended to diagnose MRSA infection nor to guide or monitor treatment for MRSA infections.          Radiology Studies: Dg Chest Port 1 View  Result Date: 12/05/2015 CLINICAL DATA:  Acute respiratory failure with hypoxia. Shortness of breath. EXAM: PORTABLE CHEST 1 VIEW COMPARISON:  12/03/2015 FINDINGS: Single  lead ICD remains in place. Right PICC is unchanged, terminating over the SVC. Cardiac silhouette remains enlarged. Interstitial and airspace opacities involving the left greater than right lungs have not significantly changed. No sizable pleural effusion or pneumothorax is identified. IMPRESSION: Unchanged left greater than right lung opacities which may reflect multifocal pneumonia or asymmetric edema. Electronically Signed   By: Sebastian AcheAllen  Grady M.D.   On: 12/05/2015 08:44   Dg Chest Port 1 View  Result Date: 12/03/2015 CLINICAL DATA:  Acute respiratory failure.  Hypoxic. EXAM: PORTABLE CHEST 1 VIEW COMPARISON:  12/01/2015. FINDINGS: Stable enlarged cardiac silhouette and left subclavian AICD lead. The right PICC tip is in the superior vena cava. The interstitial markings are less prominent. Increased and interval patchy opacity throughout the left lung and in the right mid and lower lung zones. No visible pleural fluid. Mild left shoulder degenerative changes. IMPRESSION: 1. Progressive and interval bilateral pneumonia or alveolar edema, greater on the left. 2. Improved interstitial pulmonary edema. 3. Stable cardiomegaly.  Electronically Signed   By: Beckie SaltsSteven  Reid M.D.   On: 12/03/2015 10:16        Scheduled Meds: . apixaban  5 mg Oral BID  . aspirin EC  81 mg Oral Daily  . carvedilol  3.125 mg Oral BID WC  . docusate sodium  100 mg Oral BID  . donepezil  10 mg Oral QHS  . famotidine  20 mg Oral Daily  . feeding supplement  1 Container Oral TID BM  . guaiFENesin  600 mg Oral BID  . loratadine  10 mg Oral Daily  . losartan  12.5 mg Oral Daily  . mexiletine  200 mg Oral Q8H  . multivitamin with minerals  1 tablet Oral Daily  . ondansetron (ZOFRAN) IV  4 mg Intravenous Q4H  . polyethylene glycol  17 g Oral Daily  . predniSONE  60 mg Oral Q breakfast  . ranolazine  500 mg Oral BID  . sodium chloride flush  10-40 mL Intracatheter Q12H  . sodium chloride flush  3 mL Intravenous Q12H   Continuous Infusions:    LOS: 7 days        Alba Kriesel Annett Gulaaniel Tywaun Hiltner, MD [facility] Pager [phone]  If 7PM-7AM, please contact night-coverage [email or web address] Password [email or web address] 12/05/2015, 9:12 AM

## 2015-12-05 NOTE — Progress Notes (Signed)
Nutrition Follow-up  DOCUMENTATION CODES:   Non-severe (moderate) malnutrition in context of chronic illness  INTERVENTION:    Continue Boost Breeze po TID, each supplement provides 250 kcal and 9 grams of protein, patient only likes orange flavor.  Continue MVI daily.  NUTRITION DIAGNOSIS:   Malnutrition related to chronic illness as evidenced by moderate depletion of body fat, moderate depletions of muscle mass, percent weight loss.  Ongoing  GOAL:   Patient will meet greater than or equal to 90% of their needs  Met  MONITOR:   PO intake, Supplement acceptance, Labs, Weight trends, Skin, I & O's  ASSESSMENT:   70 year old male with extensive cardiac history and several recent admissions for HCAP which he failed several out patient antibody treatments.  Returns 10/24 with SOB, hypoxia and worsening diffuse bilateral infiltrates.  Patient and his wife report much better intake of meals than when admitted. He likes the orange flavored Boost Breeze supplement. He is consuming 50-100% of meals and drinking Breeze at least once or twice daily.  Labs and medications reviewed.  Diet Order:  Diet Heart Room service appropriate? Yes; Fluid consistency: Thin  Skin:  Reviewed, no issues  Last BM:  10/30  Height:   Ht Readings from Last 1 Encounters:  11/28/15 5' 3" (1.6 m)    Weight:   Wt Readings from Last 1 Encounters:  12/05/15 138 lb 11.2 oz (62.9 kg)    Ideal Body Weight:  56.4 kg  BMI:  Body mass index is 24.57 kg/m.  Estimated Nutritional Needs:   Kcal:  1700-1900  Protein:  90-100 grams  Fluid:  1.9 L/day  EDUCATION NEEDS:   No education needs identified at this time  Molli Barrows, [place], [place], [place] Pager [phone] After Hours Pager [phone]

## 2015-12-05 NOTE — Progress Notes (Signed)
Name: [name] MRN: [id number] DOB: Sep 22, 1945    ADMISSION DATE:  11/28/2015 CONSULTATION DATE:  11/28/2015  REFERRING MD :  Hal Hope TRH  CHIEF COMPLAINT:  Hypoxia, diffuse infiltrates  BRIEF PATIENT DESCRIPTION: 70 y.o. male with extensive cardiac history and several recent admissions for HCAP which he failed several out patient antibiotic treatments.  Returns 10/24 with SOB, hypoxia and worsening diffuse bilateral infiltrates.  SUBJECTIVE:   O2 weaned to 6L 10/31.  Required 10L overnight when O2 was dislodged  VITAL SIGNS: Temp:  [97.7 F (36.5 C)-98.3 F (36.8 C)] 97.8 F (36.6 C) (10/31 0734) Pulse Rate:  [56-73] 73 (10/31 0813) Resp:  [11-18] 15 (10/31 0734) BP: (98-113)/(54-68) 111/67 (10/31 0734) SpO2:  [90 %-97 %] 95 % (10/31 0734) Weight:  [138 lb 11.2 oz (62.9 kg)] 138 lb 11.2 oz (62.9 kg) (10/31 0500)  PHYSICAL EXAMINATION: General:  No distress. Awake and alert.   Neuro:  Alert and oriented 4. MAE. Grossly nonfocal. HEENT: Moist mucous membranes. No scleral icterus. Pupils symmetric. Cardiovascular: Regular rate. No edema. No appreciable JVD. Lungs: normal work of breathing, lungs bilaterally with faint Velcro-like crackles L>R (improved) Abdomen: Soft. Nondistended & nontender. Skin:  Warm and dry. No rash on exposed skin.   Recent Labs Lab 12/03/15 0525 12/04/15 0456 12/05/15 0515  NA 141 137 141  K 4.2 4.9 4.1  CL 105 102 105  CO2 '31 30 30  ' BUN 32* 29* 27*  CREATININE 0.85 0.83 0.84  GLUCOSE 97 167* 88    Recent Labs Lab 12/03/15 0525 12/04/15 0456 12/05/15 0515  HGB 9.5* 10.0* 9.3*  HCT 30.7* 32.2* 30.0*  WBC 15.1* 13.6* 15.2*  PLT 442* 500* 428*   Dg Chest Port 1 View  Result Date: 12/05/2015 CLINICAL DATA:  Acute respiratory failure with hypoxia. Shortness of breath. EXAM: PORTABLE CHEST 1 VIEW COMPARISON:  12/03/2015 FINDINGS: Single lead ICD remains in place. Right PICC is unchanged, terminating over the SVC. Cardiac  silhouette remains enlarged. Interstitial and airspace opacities involving the left greater than right lungs have not significantly changed. No sizable pleural effusion or pneumothorax is identified. IMPRESSION: Unchanged left greater than right lung opacities which may reflect multifocal pneumonia or asymmetric edema. Electronically Signed   By: Logan Bores M.D.   On: 12/05/2015 08:44   Dg Chest Port 1 View  Result Date: 12/03/2015 CLINICAL DATA:  Acute respiratory failure.  Hypoxic. EXAM: PORTABLE CHEST 1 VIEW COMPARISON:  12/01/2015. FINDINGS: Stable enlarged cardiac silhouette and left subclavian AICD lead. The right PICC tip is in the superior vena cava. The interstitial markings are less prominent. Increased and interval patchy opacity throughout the left lung and in the right mid and lower lung zones. No visible pleural fluid. Mild left shoulder degenerative changes. IMPRESSION: 1. Progressive and interval bilateral pneumonia or alveolar edema, greater on the left. 2. Improved interstitial pulmonary edema. 3. Stable cardiomegaly. Electronically Signed   By: Claudie Revering M.D.   On: 12/03/2015 10:16   SIGNIFICANT EVENTS  08/23 - admit for AICD firing 10/02 - admit for HCAP 10/13 - Admit for HCAP 10/24 - admit for worsening airspace disease and persistent hypoxia. 10/25 - Transfer to ICU for arrhythmia 10/26 - Patient taken off of amiodarone drip yesterday with hallucinations. Reports dyspnea is improving.   10/31 - Steroids reduced, improved breath sounds, 6L HFNC  STUDIES:  LHC 8/24 >> Prox RCA lesion 100%, Mid cx 70%. EF 25% estimated. Continue medical therapy, no PCI. CT Chest 10/13 >>  Multi focal infiltrate involving the lower lobes, left greater than right, and the left upper lobe is most likely an infectious or inflammatory process. Recommend short-term follow-up to ensure resolution. 4 mm nodule in the right upper lobe. Recommend attention on short-term follow-up. TTE 10/26 >> EF  25-30% w/ akinesis of inferolateral & inferior myocardium. Grade 2 diastolic dysfunction. RV normal in size and function. CP  SEROLOGIES: CRP: 10.9 ESR: 40 PR-3: <3.5 DS DNA Ab: <1 MPO: <9 RF: 12.1 C3: 78 C4: 9 ANA:  Negative ANCA:  Negative Anti-CCP:  19   ASSESSMENT / PLAN:  70 y.o. male with extensive cardiac history and acute hypoxic respiratory failure. Patient's oxygen requirement is steadily improving. I am cautious to de-escalate steroid therapy at this time given his previously marginal status. Remained highly suspicious that this is all secondary to amiodarone lung toxicity. Patient's rheumatoid factor and anti-CCP are weakly positive which could suggest an autoimmune etiology to the findings on his CT imaging. This will need to be reassessed as the patient stabilizes further with repeat serologies and further evaluation by rheumatology as an outpatient.  1. Acute hypoxic respiratory failure: Continue to wean FiO2 for saturation greater than 92%. Will need O2 arranged for discharge.  Likely will need oxymizer to support O2 flow needs 2. 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  Reduce steroids to 60 mg PO QD and wean to off by 10 mg reduction every 2 weeks.  Follow up with Pulmonary (arranged) with CXR.   3. Possible pneumonia: This is doubtful. Monitor off abx.  PCCM will be available PRN.  Please call back if new needs arise.     Noe Gens, NP-C [facility] Pgr: [phone] or if no answer [phone] 12/05/2015, 9:32 AM   Attending note: I have seen and examined the patient with nurse practitioner/resident and agree with the note. History, labs and imaging reviewed.  70 Y/O with hypoxic resp failure, b/l lung infiltrates. Suspect amiodarone toxicity. He is on a slow steroid taper. Would reduce prednisone by 10 mg every 2 weeks. CXR today reviewed with unchanged pulmonary opacities. Autoimmune work up noted with  borderline CCP and RF. We will repeat them as an outpatient. Consider rheum consult if they turn positive or if he develops any arthritis symptoms. Follow up arranged in pulmonary clinic  We will be available as needed.    Marshell Garfinkel MD [facility] Pager [phone] If no answer or after 3pm call: [phone] 12/05/2015, 2:09 PM

## 2015-12-05 NOTE — Progress Notes (Signed)
Physical Therapy Treatment Patient Details Name: [name] MRN: [id number] DOB: 1945/02/07 Today's Date: 12/05/2015    History of Present Illness 70 y.o. male with extensive cardiac history and several recent admissions for HCAP which he failed several out patient antibody treatments.  Returns 10/24 with SOB, hypoxia and worsening diffuse bilateral infiltrates.    PT Comments    Patient progressing to out of the room ambulation this session. Had titrated down to 6-7 L on high flow O2, but increased for ambulation to 8-10 L on portable O2.  Did desaturate down to 77% and needed cues for seated rest due to pt motivated to walk.  Following ambulation pt seated in recliner and relates dyspnea and, again desaturated to 71%, but recovered back on high flow O2 more quickly.  Feel he will continue to benefit from skilled PT in the acute setting to allow d/c home after CIR level rehab for strengthening, conditioning and balance training.  Follow Up Recommendations  CIR     Equipment Recommendations  Rolling walker with 5" wheels    Recommendations for Other Services       Precautions / Restrictions Precautions Precautions: Fall Precaution Comments: high flow O2    Mobility  Bed Mobility Overal bed mobility: Needs Assistance       Supine to sit: HOB elevated;Supervision     General bed mobility comments: assist for lines  Transfers Overall transfer level: Needs assistance Equipment used: Rolling walker (2 wheeled) Transfers: Sit to/from Stand Sit to Stand: Min guard         General transfer comment: imbalance once standing, assist for preventing posterior LOB  Ambulation/Gait Ambulation/Gait assistance: Min assist Ambulation Distance (Feet): 125 Feet (x 2) Assistive device: Rolling walker (2 wheeled) Gait Pattern/deviations: Step-through pattern;Staggering right;Drifts right/left;Narrow base of support;Scissoring     General Gait Details: ambulated on portable O2  @ 8-10 LPM with assist for balance, cues for safety due to fast speed, LOB to R getting outside walker, min A to recover   Stairs            Wheelchair Mobility    Modified Rankin (Stroke Patients Only)       Balance     Sitting balance-Leahy Scale: Good     Standing balance support: Bilateral upper extremity supported Standing balance-Leahy Scale: Poor Standing balance comment: posterior bias in standing needing min A for balance in addition to UE support                    Cognition Arousal/Alertness: Awake/alert Behavior During Therapy: WFL for tasks assessed/performed Overall Cognitive Status: Within Functional Limits for tasks assessed                      Exercises      General Comments General comments (skin integrity, edema, etc.): Pt with portable O2 for ambulation at 8-10LPM SpO2 dropped to 77% pt needing cues to take seated rest and required about 3 minutes to recover to 91% with PLB      Pertinent Vitals/Pain Pain Assessment: No/denies pain    Home Living                      Prior Function            PT Goals (current goals can now be found in the care plan section) Progress towards PT goals: Progressing toward goals    Frequency    Min 3X/week  PT Plan Current plan remains appropriate    Co-evaluation             End of Session Equipment Utilized During Treatment: Gait belt;Oxygen Activity Tolerance: Treatment limited secondary to medical complications (Comment) Patient left: in chair;with call bell/phone within reach;with family/visitor present     Time: 1700-1749 PT Time Calculation (min) (ACUTE ONLY): 23 min  Charges:  $Gait Training: 23-37 mins                    G Codes:      Elray Mcgregor 12/27/2015, 1:36 PM  Sheran Lawless, PT [phone] 27-Dec-2015

## 2015-12-05 NOTE — Care Management Note (Addendum)
Case Management Note  Patient Details  Name: [name] MRN: [id number] Date of Birth: 1945-12-22  Subjective/Objective: Pt presented for Acute Respiratory Failure-CVP monitoring. Pt continues on High flow 02. Pt is from home with services for [facility] RN via [facility]. [facility] was following the patient as well.                   Action/Plan: Rehab Admission Coordinator for CIR is assessing the patient daily. CM will continue to monitor for additional disposition needs.   Expected Discharge Date:                  Expected Discharge Plan:  IP Rehab Facility  In-House Referral:  Clinical Social Work  Discharge planning Services  CM Consult  Post Acute Care Choice:  NA Choice offered to:  NA  DME Arranged:  N/A DME Agency:   N/A  HH Arranged:  RN, Disease Management, PT HH Agency:  [facility]  Status of Service: Completed  If discussed at Long Length of Stay Meetings, dates discussed:    Additional Comments: 1252 12-06-15 Plan for d/c to CIR today. No further needs from CM at this time.  Gala Lewandowsky, RN 12/05/2015, 12:20 PM

## 2015-12-05 NOTE — Progress Notes (Signed)
SUBJECTIVE: The patient is doing well today.  At this time, he denies chest pain, has improving shortness of breath, no new concerns.  CURRENT MEDICATIONS: . apixaban  5 mg Oral BID  . aspirin EC  81 mg Oral Daily  . carvedilol  3.125 mg Oral BID WC  . docusate sodium  100 mg Oral BID  . donepezil  10 mg Oral QHS  . famotidine  20 mg Oral Daily  . feeding supplement  1 Container Oral TID BM  . guaiFENesin  600 mg Oral BID  . loratadine  10 mg Oral Daily  . losartan  12.5 mg Oral Daily  . mexiletine  200 mg Oral Q8H  . multivitamin with minerals  1 tablet Oral Daily  . ondansetron (ZOFRAN) IV  4 mg Intravenous Q4H  . polyethylene glycol  17 g Oral Daily  . predniSONE  60 mg Oral Q breakfast  . ranolazine  500 mg Oral BID  . sodium chloride flush  10-40 mL Intracatheter Q12H  . sodium chloride flush  3 mL Intravenous Q12H      OBJECTIVE: Physical Exam: Vitals:   12/04/15 2346 12/05/15 0500 12/05/15 0734 12/05/15 0813  BP: 113/63 111/68 111/67   Pulse:  72 (!) 56 73  Resp:  11 15   Temp: 97.7 F (36.5 C) 98.2 F (36.8 C) 97.8 F (36.6 C)   TempSrc: Axillary  Axillary   SpO2:  90% 95%   Weight:  138 lb 11.2 oz (62.9 kg)    Height:        Intake/Output Summary (Last 24 hours) at 12/05/15 0938 Last data filed at 12/05/15 0912  Gross per 24 hour  Intake              600 ml  Output             1075 ml  Net             -475 ml    Telemetry reveals sinus rhythm, no VT last 24 hours  GEN- The patient is elderly and chronically ill appearing, alert and oriented x 3 today.   Head- normocephalic, atraumatic Eyes-  Sclera clear, conjunctiva pink Ears- hearing intact Oropharynx- clear Neck- supple, 7 cm JVD Lungs- Clear to ausculation bilaterally, normal work of breathing Heart- Regular rate and rhythm  GI- soft, NT, ND Extremities- no clubbing, cyanosis, or edema Skin- no rash or lesion Psych- euthymic mood, full affect Neuro- strength and sensation are  intact  LABS: Basic Metabolic Panel:  Recent Labs  18/29/93 0456 12/05/15 0515  NA 137 141  K 4.9 4.1  CL 102 105  CO2 30 30  GLUCOSE 167* 88  BUN 29* 27*  CREATININE 0.83 0.84  CALCIUM 7.7* 7.6*  MG 2.2 2.0   CBC:  Recent Labs  12/04/15 0456 12/05/15 0515  WBC 13.6* 15.2*  NEUTROABS 12.8* 12.4*  HGB 10.0* 9.3*  HCT 32.2* 30.0*  MCV 88.5 90.1  PLT 500* 428*    RADIOLOGY: Dg Chest 2 View Result Date: 11/28/2015 CLINICAL DATA:  Shortness of breath EXAM: CHEST  2 VIEW COMPARISON:  11/24/2015, 03/06/2011, CT 11/17/2015 FINDINGS: Left-sided single lead ICD. Lead placement similar compared to previous. There is cardiomegaly. Interval increase in interstitial opacities asymmetric in the left thorax. Slight increased interstitial opacities within the right lung base. Atherosclerosis of the aorta. No pneumothorax. IMPRESSION: 1. Increased interstitial and alveolar opacities within the left greater then right chest since the previous exam. 2.  Stable degree of cardiomegaly. Electronically Signed   By: Jasmine PangKim  Fujinaga M.D.   On: 11/28/2015 04:38   ASSESSMENT AND PLAN:   1.  Ventricular tachycardia No recurrent or sustained VT Continue Mexiletine and Ranolazine K and Mg remain stable  2.  Chronic systolic heart failure Continue current therapy Appreciate management by AHF team Will need to be careful with BB as he has underlying conduction system disease and has required V pacing in the past with amiodarone. He does not have atrial lead.   3.  Acute hypoxic respiratory failure Suspect amio lung toxicity Appreciate PCCM management Patient is feeling improved  4.  Paroxysmal atrial fibrillation Currently in SR May recur as amiodarone washes out Continue Eliquis for CHADS2VASC of 3 He has had prior GI bleeding, CBC has been stable this admission  EP will see PRN, call for sustained VT or ICD shocks Recall if needed  EP Attending  Patient seen and examined. Agree with  the findings as noted above. He is stable from VT/EP perspective and appears to be tolerating his Mexilitine well. No GI side effects. Continue current dose and I will plan to see back in followup. Would hope to wean steroids sooner than later although his exam does appear to be responding to removal of the amiodarone along with predisone.   Skai Lickteig,M.D. Francis DowseRenee Ursuy, PA-C 12/05/2015 9:27 AM

## 2015-12-06 ENCOUNTER — Inpatient Hospital Stay (HOSPITAL_COMMUNITY)
Admission: RE | Admit: 2015-12-06 | Discharge: 2015-12-17 | DRG: 947 | Disposition: A | Discharge status: Home Health | Payer: Medicare Other | Source: Intra-hospital | Attending: Physical Medicine & Rehabilitation | Admitting: Physical Medicine & Rehabilitation

## 2015-12-06 DIAGNOSIS — Z9049 Acquired absence of other specified parts of digestive tract: Secondary | ICD-10-CM

## 2015-12-06 DIAGNOSIS — J449 Chronic obstructive pulmonary disease, unspecified: Secondary | ICD-10-CM | POA: Diagnosis present

## 2015-12-06 DIAGNOSIS — J42 Unspecified chronic bronchitis: Secondary | ICD-10-CM | POA: Diagnosis not present

## 2015-12-06 DIAGNOSIS — Z95810 Presence of automatic (implantable) cardiac defibrillator: Secondary | ICD-10-CM

## 2015-12-06 DIAGNOSIS — J9601 Acute respiratory failure with hypoxia: Secondary | ICD-10-CM | POA: Diagnosis present

## 2015-12-06 DIAGNOSIS — T462X1S Poisoning by other antidysrhythmic drugs, accidental (unintentional), sequela: Secondary | ICD-10-CM

## 2015-12-06 DIAGNOSIS — Z87891 Personal history of nicotine dependence: Secondary | ICD-10-CM

## 2015-12-06 DIAGNOSIS — Z881 Allergy status to other antibiotic agents status: Secondary | ICD-10-CM

## 2015-12-06 DIAGNOSIS — R911 Solitary pulmonary nodule: Secondary | ICD-10-CM | POA: Diagnosis present

## 2015-12-06 DIAGNOSIS — J810 Acute pulmonary edema: Secondary | ICD-10-CM

## 2015-12-06 DIAGNOSIS — I25110 Atherosclerotic heart disease of native coronary artery with unstable angina pectoris: Secondary | ICD-10-CM | POA: Diagnosis present

## 2015-12-06 DIAGNOSIS — N179 Acute kidney failure, unspecified: Secondary | ICD-10-CM | POA: Diagnosis not present

## 2015-12-06 DIAGNOSIS — Z885 Allergy status to narcotic agent status: Secondary | ICD-10-CM | POA: Diagnosis not present

## 2015-12-06 DIAGNOSIS — D72820 Lymphocytosis (symptomatic): Secondary | ICD-10-CM

## 2015-12-06 DIAGNOSIS — I252 Old myocardial infarction: Secondary | ICD-10-CM | POA: Diagnosis not present

## 2015-12-06 DIAGNOSIS — I5023 Acute on chronic systolic (congestive) heart failure: Secondary | ICD-10-CM | POA: Diagnosis not present

## 2015-12-06 DIAGNOSIS — I5022 Chronic systolic (congestive) heart failure: Secondary | ICD-10-CM | POA: Diagnosis present

## 2015-12-06 DIAGNOSIS — J90 Pleural effusion, not elsewhere classified: Secondary | ICD-10-CM

## 2015-12-06 DIAGNOSIS — Z8719 Personal history of other diseases of the digestive system: Secondary | ICD-10-CM | POA: Insufficient documentation

## 2015-12-06 DIAGNOSIS — R197 Diarrhea, unspecified: Secondary | ICD-10-CM | POA: Diagnosis present

## 2015-12-06 DIAGNOSIS — I480 Paroxysmal atrial fibrillation: Secondary | ICD-10-CM | POA: Diagnosis present

## 2015-12-06 DIAGNOSIS — R0603 Acute respiratory distress: Secondary | ICD-10-CM | POA: Insufficient documentation

## 2015-12-06 DIAGNOSIS — R0602 Shortness of breath: Secondary | ICD-10-CM | POA: Diagnosis not present

## 2015-12-06 DIAGNOSIS — J80 Acute respiratory distress syndrome: Secondary | ICD-10-CM | POA: Diagnosis not present

## 2015-12-06 DIAGNOSIS — I2510 Atherosclerotic heart disease of native coronary artery without angina pectoris: Secondary | ICD-10-CM | POA: Insufficient documentation

## 2015-12-06 DIAGNOSIS — I255 Ischemic cardiomyopathy: Secondary | ICD-10-CM | POA: Diagnosis present

## 2015-12-06 DIAGNOSIS — R5381 Other malaise: Secondary | ICD-10-CM | POA: Diagnosis present

## 2015-12-06 DIAGNOSIS — K9189 Other postprocedural complications and disorders of digestive system: Secondary | ICD-10-CM

## 2015-12-06 DIAGNOSIS — F418 Other specified anxiety disorders: Secondary | ICD-10-CM | POA: Diagnosis not present

## 2015-12-06 DIAGNOSIS — Z9981 Dependence on supplemental oxygen: Secondary | ICD-10-CM | POA: Diagnosis not present

## 2015-12-06 DIAGNOSIS — R0600 Dyspnea, unspecified: Secondary | ICD-10-CM | POA: Diagnosis not present

## 2015-12-06 DIAGNOSIS — J8410 Pulmonary fibrosis, unspecified: Secondary | ICD-10-CM | POA: Diagnosis present

## 2015-12-06 DIAGNOSIS — T462X5A Adverse effect of other antidysrhythmic drugs, initial encounter: Secondary | ICD-10-CM

## 2015-12-06 DIAGNOSIS — I110 Hypertensive heart disease with heart failure: Secondary | ICD-10-CM | POA: Diagnosis present

## 2015-12-06 DIAGNOSIS — Z880 Allergy status to penicillin: Secondary | ICD-10-CM

## 2015-12-06 DIAGNOSIS — D72828 Other elevated white blood cell count: Secondary | ICD-10-CM | POA: Diagnosis present

## 2015-12-06 DIAGNOSIS — I25119 Atherosclerotic heart disease of native coronary artery with unspecified angina pectoris: Secondary | ICD-10-CM | POA: Diagnosis not present

## 2015-12-06 DIAGNOSIS — D638 Anemia in other chronic diseases classified elsewhere: Secondary | ICD-10-CM | POA: Diagnosis present

## 2015-12-06 DIAGNOSIS — Z8701 Personal history of pneumonia (recurrent): Secondary | ICD-10-CM

## 2015-12-06 DIAGNOSIS — E860 Dehydration: Secondary | ICD-10-CM | POA: Diagnosis present

## 2015-12-06 DIAGNOSIS — R0902 Hypoxemia: Secondary | ICD-10-CM

## 2015-12-06 DIAGNOSIS — E46 Unspecified protein-calorie malnutrition: Secondary | ICD-10-CM

## 2015-12-06 DIAGNOSIS — Z888 Allergy status to other drugs, medicaments and biological substances status: Secondary | ICD-10-CM | POA: Diagnosis not present

## 2015-12-06 DIAGNOSIS — I959 Hypotension, unspecified: Secondary | ICD-10-CM | POA: Diagnosis not present

## 2015-12-06 DIAGNOSIS — R4589 Other symptoms and signs involving emotional state: Secondary | ICD-10-CM | POA: Insufficient documentation

## 2015-12-06 DIAGNOSIS — J432 Centrilobular emphysema: Secondary | ICD-10-CM | POA: Diagnosis not present

## 2015-12-06 DIAGNOSIS — Z8679 Personal history of other diseases of the circulatory system: Secondary | ICD-10-CM

## 2015-12-06 DIAGNOSIS — T380X5A Adverse effect of glucocorticoids and synthetic analogues, initial encounter: Secondary | ICD-10-CM | POA: Diagnosis present

## 2015-12-06 DIAGNOSIS — T462X1A Poisoning by other antidysrhythmic drugs, accidental (unintentional), initial encounter: Secondary | ICD-10-CM

## 2015-12-06 DIAGNOSIS — Z09 Encounter for follow-up examination after completed treatment for conditions other than malignant neoplasm: Secondary | ICD-10-CM

## 2015-12-06 LAB — MAGNESIUM: Magnesium: 2 mg/dL (ref 1.7–2.4)

## 2015-12-06 LAB — BASIC METABOLIC PANEL
ANION GAP: 5 (ref 5–15)
BUN: 26 mg/dL — ABNORMAL HIGH (ref 6–20)
CALCIUM: 7.5 mg/dL — AB (ref 8.9–10.3)
CO2: 29 mmol/L (ref 22–32)
Chloride: 104 mmol/L (ref 101–111)
Creatinine, Ser: 0.86 mg/dL (ref 0.61–1.24)
Glucose, Bld: 98 mg/dL (ref 65–99)
POTASSIUM: 4.1 mmol/L (ref 3.5–5.1)
Sodium: 138 mmol/L (ref 135–145)

## 2015-12-06 LAB — CBC WITH DIFFERENTIAL/PLATELET
Basophils Absolute: 0 10*3/uL (ref 0.0–0.1)
Basophils Relative: 0 %
Eosinophils Absolute: 0.1 10*3/uL (ref 0.0–0.7)
Eosinophils Relative: 1 %
HCT: 29.3 % — ABNORMAL LOW (ref 39.0–52.0)
Hemoglobin: 9.2 g/dL — ABNORMAL LOW (ref 13.0–17.0)
Lymphocytes Relative: 12 %
Lymphs Abs: 1.4 10*3/uL (ref 0.7–4.0)
MCH: 27.5 pg (ref 26.0–34.0)
MCHC: 31.4 g/dL (ref 30.0–36.0)
MCV: 87.7 fL (ref 78.0–100.0)
Monocytes Absolute: 1.1 10*3/uL — ABNORMAL HIGH (ref 0.1–1.0)
Monocytes Relative: 9 %
Neutro Abs: 9.6 10*3/uL — ABNORMAL HIGH (ref 1.7–7.7)
Neutrophils Relative %: 78 %
Platelets: 430 10*3/uL — ABNORMAL HIGH (ref 150–400)
RBC: 3.34 MIL/uL — ABNORMAL LOW (ref 4.22–5.81)
RDW: 15.3 % (ref 11.5–15.5)
WBC: 12.1 10*3/uL — ABNORMAL HIGH (ref 4.0–10.5)

## 2015-12-06 MED ORDER — APIXABAN 5 MG PO TABS
5.0000 mg | ORAL_TABLET | Freq: Two times a day (BID) | ORAL | Status: DC
  Administered 2015-12-06 – 2015-12-17 (×22): 5 mg via ORAL
  Filled 2015-12-06 (×22): qty 1

## 2015-12-06 MED ORDER — DIAZEPAM 5 MG PO TABS: 5.0000 mg | ORAL_TABLET | Freq: Four times a day (QID) | ORAL | 0 refills | Status: DC | PRN

## 2015-12-06 MED ORDER — BOOST / RESOURCE BREEZE PO LIQD
1.0000 | Freq: Three times a day (TID) | ORAL | Status: DC
  Administered 2015-12-06 – 2015-12-11 (×6): 1 via ORAL
  Administered 2015-12-11 (×2): via ORAL
  Administered 2015-12-12 – 2015-12-16 (×4): 1 via ORAL

## 2015-12-06 MED ORDER — DIPHENHYDRAMINE HCL 12.5 MG/5ML PO ELIX: 12.5000 mg | ORAL_SOLUTION | Freq: Four times a day (QID) | ORAL | Status: DC | PRN

## 2015-12-06 MED ORDER — CARVEDILOL 3.125 MG PO TABS
3.1250 mg | ORAL_TABLET | Freq: Two times a day (BID) | ORAL | Status: DC
  Administered 2015-12-06 – 2015-12-17 (×22): 3.125 mg via ORAL
  Filled 2015-12-06 (×22): qty 1

## 2015-12-06 MED ORDER — ORAL CARE MOUTH RINSE
15.0000 mL | Freq: Two times a day (BID) | OROMUCOSAL | Status: DC
  Administered 2015-12-07 – 2015-12-17 (×9): 15 mL via OROMUCOSAL

## 2015-12-06 MED ORDER — GUAIFENESIN-DM 100-10 MG/5ML PO SYRP
5.0000 mL | ORAL_SOLUTION | Freq: Four times a day (QID) | ORAL | Status: DC | PRN
Start: 2015-12-06 — End: 2015-12-17

## 2015-12-06 MED ORDER — BOOST / RESOURCE BREEZE PO LIQD: 1.0000 | Freq: Three times a day (TID) | ORAL | 0 refills | Status: DC

## 2015-12-06 MED ORDER — PROCHLORPERAZINE EDISYLATE 5 MG/ML IJ SOLN: 5.0000 mg | Freq: Four times a day (QID) | INTRAMUSCULAR | Status: DC | PRN

## 2015-12-06 MED ORDER — DIPHENOXYLATE-ATROPINE 2.5-0.025 MG PO TABS
1.0000 | ORAL_TABLET | Freq: Four times a day (QID) | ORAL | Status: DC | PRN
  Administered 2015-12-08 – 2015-12-10 (×4): 1 via ORAL
  Filled 2015-12-06 (×4): qty 1

## 2015-12-06 MED ORDER — ZOLPIDEM TARTRATE 5 MG PO TABS
5.0000 mg | ORAL_TABLET | Freq: Every evening | ORAL | Status: DC | PRN
  Administered 2015-12-07 – 2015-12-16 (×9): 5 mg via ORAL
  Filled 2015-12-06 (×9): qty 1

## 2015-12-06 MED ORDER — ACETAMINOPHEN 325 MG PO TABS
650.0000 mg | ORAL_TABLET | Freq: Four times a day (QID) | ORAL | Status: AC | PRN
Start: 2015-12-06 — End: ?

## 2015-12-06 MED ORDER — BENZONATATE 100 MG PO CAPS
200.0000 mg | ORAL_CAPSULE | ORAL | Status: DC | PRN
  Administered 2015-12-06 – 2015-12-07 (×2): 200 mg via ORAL
  Filled 2015-12-06 (×3): qty 2

## 2015-12-06 MED ORDER — CALCIUM CARBONATE ANTACID 500 MG PO CHEW: 1.0000 | CHEWABLE_TABLET | Freq: Two times a day (BID) | ORAL | Status: DC | PRN

## 2015-12-06 MED ORDER — DONEPEZIL HCL 10 MG PO TABS
10.0000 mg | ORAL_TABLET | Freq: Every day | ORAL | Status: DC
  Administered 2015-12-06 – 2015-12-16 (×11): 10 mg via ORAL
  Filled 2015-12-06 (×12): qty 1

## 2015-12-06 MED ORDER — LOSARTAN POTASSIUM 25 MG PO TABS: 12.5000 mg | ORAL_TABLET | Freq: Every day | ORAL | Status: DC

## 2015-12-06 MED ORDER — BISACODYL 10 MG RE SUPP: 10.0000 mg | Freq: Every day | RECTAL | Status: DC | PRN

## 2015-12-06 MED ORDER — ASPIRIN EC 81 MG PO TBEC
81.0000 mg | DELAYED_RELEASE_TABLET | Freq: Every day | ORAL | Status: DC
  Administered 2015-12-07 – 2015-12-17 (×11): 81 mg via ORAL
  Filled 2015-12-06 (×11): qty 1

## 2015-12-06 MED ORDER — TRAMADOL HCL 50 MG PO TABS
50.0000 mg | ORAL_TABLET | Freq: Four times a day (QID) | ORAL | Status: DC | PRN
  Administered 2015-12-11 – 2015-12-12 (×2): 50 mg via ORAL
  Filled 2015-12-06 (×2): qty 1

## 2015-12-06 MED ORDER — ALUM & MAG HYDROXIDE-SIMETH 200-200-20 MG/5ML PO SUSP: 30.0000 mL | ORAL | Status: DC | PRN

## 2015-12-06 MED ORDER — LEVALBUTEROL HCL 1.25 MG/0.5ML IN NEBU
1.2500 mg | INHALATION_SOLUTION | Freq: Four times a day (QID) | RESPIRATORY_TRACT | Status: DC | PRN
  Administered 2015-12-07 – 2015-12-11 (×3): 1.25 mg via RESPIRATORY_TRACT
  Filled 2015-12-06 (×6): qty 0.5

## 2015-12-06 MED ORDER — FAMOTIDINE 20 MG PO TABS
20.0000 mg | ORAL_TABLET | Freq: Every day | ORAL | Status: DC
  Administered 2015-12-07 – 2015-12-17 (×11): 20 mg via ORAL
  Filled 2015-12-06 (×11): qty 1

## 2015-12-06 MED ORDER — DIAZEPAM 2 MG PO TABS
2.0000 mg | ORAL_TABLET | Freq: Two times a day (BID) | ORAL | Status: DC | PRN
  Administered 2015-12-12: 2 mg via ORAL
  Filled 2015-12-06: qty 1

## 2015-12-06 MED ORDER — LORATADINE 10 MG PO TABS
10.0000 mg | ORAL_TABLET | Freq: Every day | ORAL | Status: DC
  Administered 2015-12-07 – 2015-12-17 (×11): 10 mg via ORAL
  Filled 2015-12-06 (×12): qty 1

## 2015-12-06 MED ORDER — ZOLPIDEM TARTRATE 5 MG PO TABS: 5.0000 mg | ORAL_TABLET | Freq: Every evening | ORAL | 0 refills | Status: DC | PRN

## 2015-12-06 MED ORDER — GI COCKTAIL ~~LOC~~
30.0000 mL | Freq: Once | ORAL | Status: AC
  Administered 2015-12-06: 30 mL via ORAL
  Filled 2015-12-06: qty 30

## 2015-12-06 MED ORDER — LOSARTAN POTASSIUM 25 MG PO TABS
12.5000 mg | ORAL_TABLET | Freq: Every day | ORAL | Status: DC
  Administered 2015-12-07 – 2015-12-17 (×11): 12.5 mg via ORAL
  Filled 2015-12-06 (×11): qty 0.5

## 2015-12-06 MED ORDER — PROCHLORPERAZINE MALEATE 5 MG PO TABS: 5.0000 mg | ORAL_TABLET | Freq: Four times a day (QID) | ORAL | Status: DC | PRN

## 2015-12-06 MED ORDER — FLEET ENEMA 7-19 GM/118ML RE ENEM
1.0000 | ENEMA | Freq: Once | RECTAL | Status: DC | PRN
Start: 2015-12-06 — End: 2015-12-17

## 2015-12-06 MED ORDER — PREDNISONE 20 MG PO TABS
60.0000 mg | ORAL_TABLET | Freq: Every day | ORAL | Status: DC
  Administered 2015-12-07 – 2015-12-17 (×11): 60 mg via ORAL
  Filled 2015-12-06 (×11): qty 3

## 2015-12-06 MED ORDER — RANOLAZINE ER 500 MG PO TB12
500.0000 mg | ORAL_TABLET | Freq: Two times a day (BID) | ORAL | Status: DC
  Administered 2015-12-06 – 2015-12-17 (×22): 500 mg via ORAL
  Filled 2015-12-06 (×22): qty 1

## 2015-12-06 MED ORDER — PREDNISONE 20 MG PO TABS: 60.0000 mg | ORAL_TABLET | Freq: Every day | ORAL | Status: DC

## 2015-12-06 MED ORDER — PROCHLORPERAZINE 25 MG RE SUPP: 12.5000 mg | Freq: Four times a day (QID) | RECTAL | Status: DC | PRN

## 2015-12-06 MED ORDER — ADULT MULTIVITAMIN W/MINERALS CH
1.0000 | ORAL_TABLET | Freq: Every day | ORAL | Status: DC
  Administered 2015-12-07 – 2015-12-17 (×11): 1 via ORAL
  Filled 2015-12-06 (×11): qty 1

## 2015-12-06 MED ORDER — ACETAMINOPHEN 325 MG PO TABS: 325.0000 mg | ORAL_TABLET | ORAL | Status: DC | PRN

## 2015-12-06 MED ORDER — CARVEDILOL 3.125 MG PO TABS: 3.1250 mg | ORAL_TABLET | Freq: Two times a day (BID) | ORAL | Status: DC

## 2015-12-06 MED ORDER — GUAIFENESIN ER 600 MG PO TB12
600.0000 mg | ORAL_TABLET | Freq: Two times a day (BID) | ORAL | Status: DC
  Administered 2015-12-07 – 2015-12-17 (×17): 600 mg via ORAL
  Filled 2015-12-06 (×19): qty 1

## 2015-12-06 MED ORDER — BENZONATATE 200 MG PO CAPS: 200.0000 mg | ORAL_CAPSULE | ORAL | 0 refills | Status: DC | PRN

## 2015-12-06 MED ORDER — MEXILETINE HCL 200 MG PO CAPS
200.0000 mg | ORAL_CAPSULE | Freq: Three times a day (TID) | ORAL | Status: DC
  Administered 2015-12-06 – 2015-12-17 (×32): 200 mg via ORAL
  Filled 2015-12-06 (×33): qty 1

## 2015-12-06 MED ORDER — MEXILETINE HCL 200 MG PO CAPS: 200.0000 mg | ORAL_CAPSULE | Freq: Three times a day (TID) | ORAL | Status: DC

## 2015-12-06 MED ORDER — METAXALONE 400 MG HALF TABLET
400.0000 mg | ORAL_TABLET | Freq: Three times a day (TID) | ORAL | Status: DC | PRN
  Administered 2015-12-08 – 2015-12-12 (×5): 400 mg via ORAL
  Filled 2015-12-06 (×6): qty 1

## 2015-12-06 NOTE — Progress Notes (Signed)
Standley Brooking, RN Rehab Admission Coordinator Signed Physical Medicine and Rehabilitation  PMR Pre-admission Date of Service: 12/06/2015 1:43 PM  Related encounter: ED to Hosp-Admission (Current) from 11/28/2015 in [facility]       [] Hide copied text PMR Admission Coordinator Pre-Admission Assessment  Patient: [name] is an 70 y.o., male MRN: [id number] DOB: 23-May-1945 Height: 5\' 3"  (160 cm) Weight: 61.7 kg (136 lb)                                                                                                                                                  Insurance Information HMO:     PPO:      PCP:      IPA:      80/20: yes     OTHER: no HMO PRIMARY: Medicare a and b      Policy#: [id number]      Subscriber: pt Benefits:  Phone #: passport one online     Name: 12/06/2015 Eff. Date: 05/06/2006     Deduct: $1316      Out of Pocket Max: none      Life Max: none CIR: 100%      SNF: 20 full days Outpatient: 80%     Co-Pay: 20$ Home Health: 100%      Co-Pay: none DME: 80%     Co-Pay: 20% Providers: pt choice  SECONDARY: Sharen Counter      Policy#: [id number]      Subscriber: pt  Medicaid Application Date:       Case Manager:  Disability Application Date:       Case Worker:   Emergency Contact Information        Contact Information    Name Relation Home Work Mobile   [place] Spouse   [phone]     Current Medical History  Patient Admitting Diagnosis: debility related to acute on chronic respiratory failure/hypoxia and multiple  Medical issues  History of Present Illness:  Randy Coven Bosleyis a 70 y.o.malewith history of CAD, A Fib, ICM s/p AICD, COPD, anxiety disorder, GIB,multiple hospitalizations for HCAP in the past few months --most recent for HCAP requiring discharge to home on oxygen. He was readmitted on 11/28/15 with ongoing hypoxia and progressive diffuse bilateral infiltrates. Dr. Tyson Alias felt that  infiltrates inflammatory in nature ---question vascular v/s amiodarone toxicity--RA and anti-CCP weakly positive. Solumedrol added and amiodarone discontinued. He developed VT which accelerated to VF with appropriate ICD therapy. His device was reprogrammed and he was started on Mexiletine for VT suppression. He continuedto require high flow oxygen due to persistent hypoxemia and was started on BIPAP at nights. He was placed on broad spectrum antibiotics due to possibility of PNA--antibiotics discontinued 10/28 and PCCM recommends monitoring for now. Has improved with high dose steroids and Dr. Alvia Grove recommends taper ing  by 10 mg every 2 weeks with outpatient f/u. To continue to wean Fio2 for saturation >92% for amiodarone toxicity and will need oxymizer to support oxygen needs.   Respiratory failure highly suspicious for amiodarone toxicity but will need repeat serologies to rule out auto-immune etiology. He has been weaned down to 5 liters at rest and requires 8-10 liters with activity as desaturates to low 70s but recovers with rest. No recurrent VT on Mexiletine and Ranolazine and in SR but may have recurrence of A fib as amiodarone washes out per Dr. [name]. Anxiety has resolved with improvement in activity tolerance.   Past Medical History      Past Medical History:  Diagnosis Date  . Acute lower GI bleeding 12/11/2011   "first time" (12/11/2011)  . Acute myocardial infarction, unspecified site, episode of care unspecified 1995   Pt living in [place], no stent, ?PTCA  . AICD (automatic cardioverter/defibrillator) present   . Allergic rhinitis, cause unspecified   . Anxiety   . Arthritis    "all over" (11/07/2015)  . Atrial fibrillation (HCC)    a. 05/2015 - converted to sinus in setting of ICD shocks; placed on eliquis 5 bid.  [name] CAD (coronary artery disease), autologous vein bypass graft   . Cervical herniated disc    told not to lift >10 lbs  . Chronic systolic CHF  (congestive heart failure), NYHA class 2 (HCC)    Reports EF of 25%.   [name] COPD (chronic obstructive pulmonary disease) (HCC) 11/2012   by xray  . Diverticulosis    by CT scan  . HCAP (healthcare-associated pneumonia) 11/06/2015  . Hypertension   . Insomnia   . Paroxysmal ventricular tachycardia (HCC)   . Perennial allergic rhinitis    only to dust mites  . Pneumonia 2000s   "walking pneumonia"  . VT (ventricular tachycardia) (HCC)    a. 05/2015 - VT storm with multiple ICD shocks-->Amio 400 BID.   Family History  family history includes Cancer in his sister; Diabetes in his father; Stroke in his mother; Tracheal cancer (age of onset: 47) in his father.  Prior Rehab/Hospitalizations:  Has the patient had major surgery during 100 days prior to admission? No  Current Medications   Current Facility-Administered Medications:  .  acetaminophen (TYLENOL) tablet 650 mg, 650 mg, Oral, Q6H PRN **OR** acetaminophen (TYLENOL) suppository 650 mg, 650 mg, Rectal, Q6H PRN, Russella Dar, NP .  apixaban Everlene Balls) tablet 5 mg, 5 mg, Oral, BID, Russella Dar, NP, 5 mg at 12/06/15 0947 .  aspirin EC tablet 81 mg, 81 mg, Oral, Daily, Russella Dar, NP, 81 mg at 12/06/15 0947 .  benzonatate (TESSALON) capsule 200 mg, 200 mg, Oral, Q4H PRN, Russella Dar, NP, 200 mg at 12/03/15 0051 .  carvedilol (COREG) tablet 3.125 mg, 3.125 mg, Oral, BID WC, Laurey Morale, MD, 3.125 mg at 12/06/15 0826 .  diazepam (VALIUM) tablet 5 mg, 5 mg, Oral, Q6H PRN, Russella Dar, NP, 5 mg at 11/30/15 2140 .  docusate sodium (COLACE) capsule 100 mg, 100 mg, Oral, BID, Roslynn Amble, MD, 100 mg at 12/04/15 1047 .  donepezil (ARICEPT) tablet 10 mg, 10 mg, Oral, QHS, Russella Dar, NP, 10 mg at 12/05/15 2302 .  famotidine (PEPCID) tablet 20 mg, 20 mg, Oral, Daily, Russella Dar, NP, 20 mg at 12/06/15 0947 .  feeding supplement (BOOST / RESOURCE BREEZE) liquid 1 Container, 1 Container, Oral, TID  BM, Mauricio Annett Gula, MD,  1 Container at 12/05/15 2025 .  guaiFENesin (MUCINEX) 12 hr tablet 600 mg, 600 mg, Oral, BID, Russella Dar, NP, 600 mg at 12/06/15 0947 .  HYDROcodone-acetaminophen (NORCO/VICODIN) 5-325 MG per tablet 1 tablet, 1 tablet, Oral, Q6H PRN, Russella Dar, NP, 1 tablet at 12/06/15 [phone] .  levalbuterol (XOPENEX) nebulizer solution 1.25 mg, 1.25 mg, Nebulization, Q6H PRN, Russella Dar, NP .  loperamide (IMODIUM) capsule 2 mg, 2 mg, Oral, PRN, Marinus Maw, MD, 2 mg at 12/05/15 1844 .  loratadine (CLARITIN) tablet 10 mg, 10 mg, Oral, Daily, Russella Dar, NP, 10 mg at 12/06/15 0947 .  losartan (COZAAR) tablet 12.5 mg, 12.5 mg, Oral, Daily, Laurey Morale, MD, 12.5 mg at 12/06/15 0948 .  metaxalone (SKELAXIN) tablet 800 mg, 800 mg, Oral, TID PRN, Russella Dar, NP, 800 mg at 11/29/15 2314 .  mexiletine (MEXITIL) capsule 200 mg, 200 mg, Oral, Q8H, Marinus Maw, MD, 200 mg at 12/06/15 0550 .  multivitamin with minerals tablet 1 tablet, 1 tablet, Oral, Daily, Russella Dar, NP, 1 tablet at 12/06/15 0947 .  ondansetron [facility]) injection 4 mg, 4 mg, Intravenous, Q4H, Mauricio Annett Gula, MD, 4 mg at 12/04/15 2011 .  ondansetron (ZOFRAN) injection 4 mg, 4 mg, Intravenous, Q6H PRN, Coralie Keens, MD .  polyethylene glycol (MIRALAX / GLYCOLAX) packet 17 g, 17 g, Oral, Daily, Mauricio Daniel Arrien, MD .  predniSONE (DELTASONE) tablet 60 mg, 60 mg, Oral, Q breakfast, Mauricio Annett Gula, MD, 60 mg at 12/06/15 0826 .  ranolazine (RANEXA) 12 hr tablet 500 mg, 500 mg, Oral, BID, Russella Dar, NP, 500 mg at 12/06/15 0947 .  sodium chloride flush (NS) 0.9 % injection 10-40 mL, 10-40 mL, Intracatheter, Q12H, Mauricio Annett Gula, MD, 10 mL at 12/06/15 1000 .  sodium chloride flush (NS) 0.9 % injection 10-40 mL, 10-40 mL, Intracatheter, PRN, Coralie Keens, MD .  sodium chloride flush (NS) 0.9 % injection 3 mL, 3 mL, Intravenous, Q12H, Russella Dar, NP, 3 mL at 12/04/15 1049 .  traMADol (ULTRAM) tablet 50-100 mg, 50-100 mg, Oral, Q8H PRN, Russella Dar, NP .  zolpidem (AMBIEN) tablet 5 mg, 5 mg, Oral, QHS PRN, Russella Dar, NP, 5 mg at 12/02/15 2309  Facility-Administered Medications Ordered in Other Encounters:  .  sodium chloride 0.9 % injection 3 mL, 3 mL, Intravenous, Q12H, Marinus Maw, MD .  sodium chloride 0.9 % injection 3 mL, 3 mL, Intravenous, PRN, Marinus Maw, MD  Patients Current Diet: Diet Heart Room service appropriate? Yes; Fluid consistency: Thin Diet - low sodium heart healthy  Precautions / Restrictions Precautions Precautions: Fall Precaution Comments: high flow O2 Restrictions Weight Bearing Restrictions: No   Has the patient had 2 or more falls or a fall with injury in the past year?No  Prior Activity Level Community (5-7x/wk): very active and independent until 1 month pta; driving. Retired Naval architect for [employer], [employer] and very active. Has to be told to pace himself for he was very active until first of October so has not seen this as a chronic issue.  Home Assistive Devices / Equipment Home Assistive Devices/Equipment: Oxygen Home Equipment: None  Prior Device Use: Indicate devices/aids used by the patient prior to current illness, exacerbation or injury? None of the above  Prior Functional Level Prior Function Level of Independence: Independent Comments: Has been hospitalized 3 times this month (October)  Self Care: Did the patient need help bathing,  dressing, using the toilet or eating?  Independent  Indoor Mobility: Did the patient need assistance with walking from room to room (with or without device)? Independent  Stairs: Did the patient need assistance with internal or external stairs (with or without device)? Independent  Functional Cognition: Did the patient need help planning regular tasks such as shopping or remembering to take medications?  Independent  Current Functional Level Cognition Overall Cognitive Status: Within Functional Limits for tasks assessed Orientation Level: Oriented X4    Extremity Assessment (includes Sensation/Coordination) Upper Extremity Assessment: Generalized weakness  Lower Extremity Assessment: Generalized weakness   ADLs Overall ADL's : Needs assistance/impaired Eating/Feeding: Set up (supported sitting) Grooming: Min guard, Standing Upper Body Bathing: Minimal assitance (supported sitting) Lower Body Bathing: Moderate assistance (min A sit<>stand--but very unsteady (weak) on his feet) Upper Body Dressing : Minimal assistance (supported sitting) Lower Body Dressing: Total assistance (min A sit<>stand--but very unsteady (weak) on his feet) Toilet Transfer: Min guard, BSC, Stand-pivot Toilet Transfer Details (indicate cue type and reason): bed>recliner next to bed on pt's right Toileting- Clothing Manipulation and Hygiene: Moderate assistance Functional mobility during ADLs: Min guard   Mobility Overal bed mobility: Needs Assistance Bed Mobility: Supine to Sit, Sit to Supine Supine to sit: Supervision Sit to supine: Supervision General bed mobility comments: assist for lines   Transfers Overall transfer level: Needs assistance Equipment used: Rolling walker (2 wheeled) Transfers: Sit to/from Stand, Stand Pivot Transfers Sit to Stand: Min guard General transfer comment: imbalance once standing, assist for preventing posterior LOB   Ambulation / Gait / Stairs / Wheelchair Mobility Ambulation/Gait Ambulation/Gait assistance: ArchitectMin assist Ambulation Distance (Feet): 125 Feet (x 2) Assistive device: Rolling walker (2 wheeled) Gait Pattern/deviations: Step-through pattern, Staggering right, Drifts right/left, Narrow base of support, Scissoring General Gait Details: ambulated on portable O2 @ 8-10 LPM with assist for balance, cues for safety due to fast speed, LOB to R getting outside walker, min A to  recover   Posture / Balance Dynamic Sitting Balance Sitting balance - Comments: reliant on UEs due to ataxic movement Balance Overall balance assessment: Needs assistance Sitting-balance support: No upper extremity supported, Feet supported Sitting balance-Leahy Scale: Good Sitting balance - Comments: reliant on UEs due to ataxic movement Postural control: Posterior lean Standing balance support: Bilateral upper extremity supported Standing balance-Leahy Scale: Poor Standing balance comment: posterior bias in standing needing min A for balance in addition to UE support High Level Balance Comments: also standing reaching for items on nightstand on R with min A with L UE supported on walker   Special needs/care consideration BiPAP/CPAP  N/a CPM   N/a Continuous Drip IV   N/a Dialysis  N/a Life Vest  N/a Oxygen O2 at 6-10 liters nasal cannula. Titration attempts but desats with activity Special Bed   N/a Trach Size  N/a Wound Vac (area)   N/a Skin thin skin with some skin tears noted Bowel mgmt: continent Bladder mgmt: continent Diabetic mgmt   N/a Will go home on high flow O2 likely 8-10 liters [place]. May need Oxymizer PICC line   Previous Home Environment Living Arrangements: Spouse/significant other  Lives With: Spouse Available Help at Discharge: Family, Available 24 hours/day Type of Home: House Home Layout: Two level, 1/2 bath on main level (would sleep on couch downstairs over the past month pta) Alternate Level Stairs-Rails: Right, Left, Can reach both Alternate Level Stairs-Number of Steps: 13 Home Access: Stairs to enter Entrance Stairs-Rails: None Entrance Stairs-Number of Steps: 2 steps into back  door Bathroom Shower/Tub: Health visitor: Pharmacist, community: Yes Home Care Services: No Additional Comments: verified by pt an dwife  Discharge Living Setting Plans for Discharge Living Setting: Patient's home, Lives with (comment)  (wife) Type of Home at Discharge: House Discharge Home Layout: Two level, 1/2 bath on main level, Bed/bath upstairs (has slept on couch over the past month) Alternate Level Stairs-Rails: Right, Left, Can reach both Alternate Level Stairs-Number of Steps: 13 Discharge Home Access: Stairs to enter Entrance Stairs-Rails: None Entrance Stairs-Number of Steps: 2 at back door Discharge Bathroom Shower/Tub: Walk-in shower Discharge Bathroom Toilet: Standard Discharge Bathroom Accessibility: Yes How Accessible: Accessible via walker Does the patient have any problems obtaining your medications?: No  Social/Family/Support Systems Patient Roles: Spouse, Parent Contact Information: Hilda Lias, wife Anticipated Caregiver: wife Anticipated Industrial/product designer Information: see above Ability/Limitations of Caregiver: no limitations Caregiver Availability: 24/7 Discharge Plan Discussed with Primary Caregiver: Yes Is Caregiver In Agreement with Plan?: Yes Does Caregiver/Family have Issues with Lodging/Transportation while Pt is in Rehab?: No   Goals/Additional Needs Patient/Family Goal for Rehab: Mod I to supervision with PT and OT Expected length of stay: ELOS 8-13 days Equipment Needs: High flow Oxygen at d/c ; pulmonary recommends oxymizer Special Service Needs: new dx of amiodarone toxicity Pt/Family Agrees to Admission and willing to participate: Yes Program Orientation Provided & Reviewed with Pt/Caregiver Including Roles  & Responsibilities: Yes  Decrease burden of Care through IP rehab admission: n/a  Possible need for SNF placement upon discharge:not anticipated  Patient Condition: This patient's medical and functional status has changed since the consult dated: 12/02/2015 in which the Rehabilitation Physician determined and documented that the patient's condition is appropriate for intensive rehabilitative care in an inpatient rehabilitation facility. See "History of Present Illness"  (above) for medical update. Functional changes are: min assist overall. Patient's medical and functional status update has been discussed with the Rehabilitation physician and patient remains appropriate for inpatient rehabilitation. Will admit to inpatient rehab today.   Preadmission Screen Completed By:  Clois Dupes, 12/06/2015 1:43 PM ______________________________________________________________________   Discussed status with Dr. Allena Katz on 12/06/2015 at 1358 and received telephone approval for admission today.  Admission Coordinator:  Clois Dupes, time 9147 Date 12/06/2015       Cosigned by: Ankit Karis Juba, MD at 12/06/2015 2:10 PM  Revision History

## 2015-12-06 NOTE — Progress Notes (Signed)
Occupational Therapy Treatment Patient Details Name: Leda MinRichard L Rudy MRN: [id number] DOB: [birth year] Today's Date: 12/06/2015    History of present illness 70 y.o. male with extensive cardiac history and several recent admissions for HCAP which he failed several out patient antibody treatments.  Returns 10/24 with SOB, hypoxia and worsening diffuse bilateral infiltrates.   OT comments  Pt making progress with functional goals. Possible d/c to CIR today  Follow Up Recommendations  CIR    Equipment Recommendations  Other (comment) (TBD at CIR)    Recommendations for Other Services      Precautions / Restrictions Precautions Precautions: Fall Precaution Comments: high flow O2 Restrictions Weight Bearing Restrictions: No       Mobility Bed Mobility Overal bed mobility: Needs Assistance Bed Mobility: Supine to Sit;Sit to Supine     Supine to sit: Supervision Sit to supine: Supervision   General bed mobility comments: assist for lines  Transfers Overall transfer level: Needs assistance Equipment used: Rolling walker (2 wheeled) Transfers: Sit to/from BJ'sStand;Stand Pivot Transfers Sit to Stand: Min guard              Balance Overall balance assessment: Needs assistance   Sitting balance-Leahy Scale: Good       Standing balance-Leahy Scale: Poor                     ADL Overall ADL's : Needs assistance/impaired     Grooming: Min guard;Standing                   Toilet Transfer: Min guard;BSC;Stand-pivot   Toileting- Clothing Manipulation and Hygiene: Moderate assistance       Functional mobility during ADLs: Min guard                                        Cognition   Behavior During Therapy: WFL for tasks assessed/performed Overall Cognitive Status: Within Functional Limits for tasks assessed                       Extremity/Trunk Assessment   generalized weakness                         General Comments  pt very pleasant and cooperative    Pertinent Vitals/ Pain       Pain Assessment: No/denies pain                                                          Frequency  Min 2X/week        Progress Toward Goals  OT Goals(current goals can now be found in the care plan section)  Progress towards OT goals: Progressing toward goals  Acute Rehab OT Goals Patient Stated Goal: to go to rehab and then home  Plan Discharge plan remains appropriate    n                  End of Session Equipment Utilized During Treatment: Gait belt;Other (comment) (BSC)   Activity Tolerance Patient tolerated treatment well   Patient Left with call bell/phone within reach;with family/visitor present;in bed  Time: 9833-8250 OT Time Calculation (min): 20 min  Charges: OT General Charges $OT Visit: 1 Procedure OT Treatments $Therapeutic Activity: 8-22 mins  Galen Manila 12/06/2015, 11:23 AM

## 2015-12-06 NOTE — PMR Pre-admission (Signed)
PMR Admission Coordinator Pre-Admission Assessment  Patient: [name] is an 70 y.o., male MRN: [id number] DOB: [birth year] Height: 5\' 3"  (160 cm) Weight: 61.7 kg (136 lb)              Insurance Information HMO:     PPO:      PCP:      IPA:      80/20: yes     OTHER: no HMO PRIMARY: Medicare a and b      Policy#: [id number]      Subscriber: pt Benefits:  Phone #: passport one online     Name: 12/06/2015 Eff. Date: 05/06/2006     Deduct: $1316      Out of Pocket Max: none      Life Max: none CIR: 100%      SNF: 20 full days Outpatient: 80%     Co-Pay: 20$ Home Health: 100%      Co-Pay: none DME: 80%     Co-Pay: 20% Providers: pt choice  SECONDARY: Sharen Counter      Policy#: [id number]      Subscriber: pt  Medicaid Application Date:       Case Manager:  Disability Application Date:       Case Worker:   Emergency Contact Information Contact Information    Name Relation Home Work Mobile   [place] Spouse   [phone]     Current Medical History  Patient Admitting Diagnosis: debility related to acute on chronic respiratory failure/hypoxia and multiple  Medical issues  History of Present Illness:  Randy Cain Bosleyis a 70 y.o.malewith history of CAD, A Fib, ICM s/p AICD, COPD, anxiety disorder, GIB, multiple hospitalizations for HCAP in the past few months --most recent for HCAP requiring discharge to home on oxygen. He was readmitted on 11/28/15 with ongoing hypoxia and progressive diffuse bilateral infiltrates. Dr. Tyson Alias felt that infiltrates inflammatory in nature ---question vascular v/s amiodarone toxicity--RA and anti-CCP weakly positive.  Solumedrol added and amiodarone discontinued. He developed VT which accelerated to VF with appropriate ICD therapy. His device was reprogrammed and he was started on Mexiletine for VT suppression. He continued to require high flow oxygen due to persistent hypoxemia and was started on BIPAP at nights. He was placed on broad  spectrum antibiotics due to possibility of PNA--antibiotics discontinued 10/28 and PCCM recommends monitoring for now. Has improved with high dose steroids and Dr. Alvia Grove recommends taper ing by 10 mg every 2 weeks with outpatient f/u.  To continue to wean Fio2 for saturation > 92% for amiodarone toxicity and will need oxymizer to support oxygen needs.   Respiratory failure highly suspicious for amiodarone toxicity but will need repeat serologies to rule out auto-immune etiology. He has been weaned down to 5 liters at rest and requires 8-10 liters with activity as desaturates to low 70s but recovers with rest.  No recurrent VT on Mexiletine and Ranolazine and in SR but may have recurrence of A fib as amiodarone washes out per Dr. [name].  Anxiety has resolved with improvement in activity tolerance.   Past Medical History  Past Medical History:  Diagnosis Date  . Acute lower GI bleeding 12/11/2011   "first time" (12/11/2011)  . Acute myocardial infarction, unspecified site, episode of care unspecified 1995   Pt living in [place], no stent, ?PTCA  . AICD (automatic cardioverter/defibrillator) present   . Allergic rhinitis, cause unspecified   . Anxiety   . Arthritis    "all over" (11/07/2015)  . Atrial  fibrillation (HCC)    a. 05/2015 - converted to sinus in setting of ICD shocks; placed on eliquis 5 bid.  [name] CAD (coronary artery disease), autologous vein bypass graft   . Cervical herniated disc    told not to lift >10 lbs  . Chronic systolic CHF (congestive heart failure), NYHA class 2 (HCC)    Reports EF of 25%.   [name] COPD (chronic obstructive pulmonary disease) (HCC) 11/2012   by xray  . Diverticulosis    by CT scan  . HCAP (healthcare-associated pneumonia) 11/06/2015  . Hypertension   . Insomnia   . Paroxysmal ventricular tachycardia (HCC)   . Perennial allergic rhinitis    only to dust mites  . Pneumonia 2000s   "walking pneumonia"  . VT (ventricular tachycardia) (HCC)    a. 05/2015 -  VT storm with multiple ICD shocks-->Amio 400 BID.   Family History  family history includes Cancer in his sister; Diabetes in his father; Stroke in his mother; Tracheal cancer (age of onset: 71) in his father.  Prior Rehab/Hospitalizations:  Has the patient had major surgery during 100 days prior to admission? No  Current Medications   Current Facility-Administered Medications:  .  acetaminophen (TYLENOL) tablet 650 mg, 650 mg, Oral, Q6H PRN **OR** acetaminophen (TYLENOL) suppository 650 mg, 650 mg, Rectal, Q6H PRN, Russella Dar, NP .  apixaban Everlene Balls) tablet 5 mg, 5 mg, Oral, BID, Russella Dar, NP, 5 mg at 12/06/15 0947 .  aspirin EC tablet 81 mg, 81 mg, Oral, Daily, Russella Dar, NP, 81 mg at 12/06/15 0947 .  benzonatate (TESSALON) capsule 200 mg, 200 mg, Oral, Q4H PRN, Russella Dar, NP, 200 mg at 12/03/15 0051 .  carvedilol (COREG) tablet 3.125 mg, 3.125 mg, Oral, BID WC, Laurey Morale, MD, 3.125 mg at 12/06/15 0826 .  diazepam (VALIUM) tablet 5 mg, 5 mg, Oral, Q6H PRN, Russella Dar, NP, 5 mg at 11/30/15 2140 .  docusate sodium (COLACE) capsule 100 mg, 100 mg, Oral, BID, Roslynn Amble, MD, 100 mg at 12/04/15 1047 .  donepezil (ARICEPT) tablet 10 mg, 10 mg, Oral, QHS, Russella Dar, NP, 10 mg at 12/05/15 2302 .  famotidine (PEPCID) tablet 20 mg, 20 mg, Oral, Daily, Russella Dar, NP, 20 mg at 12/06/15 0947 .  feeding supplement (BOOST / RESOURCE BREEZE) liquid 1 Container, 1 Container, Oral, TID BM, Mauricio Annett Gula, MD, 1 Container at 12/05/15 2025 .  guaiFENesin (MUCINEX) 12 hr tablet 600 mg, 600 mg, Oral, BID, Russella Dar, NP, 600 mg at 12/06/15 0947 .  HYDROcodone-acetaminophen (NORCO/VICODIN) 5-325 MG per tablet 1 tablet, 1 tablet, Oral, Q6H PRN, Russella Dar, NP, 1 tablet at 12/06/15 [phone] .  levalbuterol (XOPENEX) nebulizer solution 1.25 mg, 1.25 mg, Nebulization, Q6H PRN, Russella Dar, NP .  loperamide (IMODIUM) capsule 2 mg, 2 mg, Oral,  PRN, Marinus Maw, MD, 2 mg at 12/05/15 1844 .  loratadine (CLARITIN) tablet 10 mg, 10 mg, Oral, Daily, Russella Dar, NP, 10 mg at 12/06/15 0947 .  losartan (COZAAR) tablet 12.5 mg, 12.5 mg, Oral, Daily, Laurey Morale, MD, 12.5 mg at 12/06/15 0948 .  metaxalone (SKELAXIN) tablet 800 mg, 800 mg, Oral, TID PRN, Russella Dar, NP, 800 mg at 11/29/15 2314 .  mexiletine (MEXITIL) capsule 200 mg, 200 mg, Oral, Q8H, Marinus Maw, MD, 200 mg at 12/06/15 0550 .  multivitamin with minerals tablet 1 tablet, 1 tablet, Oral, Daily, Kelle Darting  Rennis HardingEllis, NP, 1 tablet at 12/06/15 0947 .  ondansetron [facility](ZOFRAN) injection 4 mg, 4 mg, Intravenous, Q4H, Mauricio Annett Gulaaniel Arrien, MD, 4 mg at 12/04/15 2011 .  ondansetron (ZOFRAN) injection 4 mg, 4 mg, Intravenous, Q6H PRN, Coralie KeensMauricio Daniel Arrien, MD .  polyethylene glycol (MIRALAX / GLYCOLAX) packet 17 g, 17 g, Oral, Daily, Mauricio Daniel Arrien, MD .  predniSONE (DELTASONE) tablet 60 mg, 60 mg, Oral, Q breakfast, Mauricio Annett Gulaaniel Arrien, MD, 60 mg at 12/06/15 0826 .  ranolazine (RANEXA) 12 hr tablet 500 mg, 500 mg, Oral, BID, Russella DarAllison L Ellis, NP, 500 mg at 12/06/15 0947 .  sodium chloride flush (NS) 0.9 % injection 10-40 mL, 10-40 mL, Intracatheter, Q12H, Mauricio Annett Gulaaniel Arrien, MD, 10 mL at 12/06/15 1000 .  sodium chloride flush (NS) 0.9 % injection 10-40 mL, 10-40 mL, Intracatheter, PRN, Coralie KeensMauricio Daniel Arrien, MD .  sodium chloride flush (NS) 0.9 % injection 3 mL, 3 mL, Intravenous, Q12H, Russella DarAllison L Ellis, NP, 3 mL at 12/04/15 1049 .  traMADol (ULTRAM) tablet 50-100 mg, 50-100 mg, Oral, Q8H PRN, Russella DarAllison L Ellis, NP .  zolpidem (AMBIEN) tablet 5 mg, 5 mg, Oral, QHS PRN, Russella DarAllison L Ellis, NP, 5 mg at 12/02/15 2309  Facility-Administered Medications Ordered in Other Encounters:  .  sodium chloride 0.9 % injection 3 mL, 3 mL, Intravenous, Q12H, Marinus MawGregg W Taylor, MD .  sodium chloride 0.9 % injection 3 mL, 3 mL, Intravenous, PRN, Marinus MawGregg W Taylor, MD  Patients Current  Diet: Diet Heart Room service appropriate? Yes; Fluid consistency: Thin Diet - low sodium heart healthy  Precautions / Restrictions Precautions Precautions: Fall Precaution Comments: high flow O2 Restrictions Weight Bearing Restrictions: No   Has the patient had 2 or more falls or a fall with injury in the past year?No  Prior Activity Level Community (5-7x/wk): very active and independent until 1 month pta; driving. Retired Naval architecttruck driver for [employer] and very active. Has to be told to pace himself for he was very active until first of October so has not seen this as a chronic issue.  Home Assistive Devices / Equipment Home Assistive Devices/Equipment: Oxygen Home Equipment: None  Prior Device Use: Indicate devices/aids used by the patient prior to current illness, exacerbation or injury? None of the above  Prior Functional Level Prior Function Level of Independence: Independent Comments: Has been hospitalized 3 times this month (October)  Self Care: Did the patient need help bathing, dressing, using the toilet or eating?  Independent  Indoor Mobility: Did the patient need assistance with walking from room to room (with or without device)? Independent  Stairs: Did the patient need assistance with internal or external stairs (with or without device)? Independent  Functional Cognition: Did the patient need help planning regular tasks such as shopping or remembering to take medications? Independent  Current Functional Level Cognition  Overall Cognitive Status: Within Functional Limits for tasks assessed Orientation Level: Oriented X4    Extremity Assessment (includes Sensation/Coordination)  Upper Extremity Assessment: Generalized weakness  Lower Extremity Assessment: Generalized weakness    ADLs  Overall ADL's : Needs assistance/impaired Eating/Feeding: Set up (supported sitting) Grooming: Min guard, Standing Upper Body Bathing: Minimal assitance (supported  sitting) Lower Body Bathing: Moderate assistance (min A sit<>stand--but very unsteady (weak) on his feet) Upper Body Dressing : Minimal assistance (supported sitting) Lower Body Dressing: Total assistance (min A sit<>stand--but very unsteady (weak) on his feet) Toilet Transfer: Min guard, BSC, Stand-pivot Toilet Transfer Details (indicate cue type and reason): bed>recliner next to bed  on pt's right Toileting- Clothing Manipulation and Hygiene: Moderate assistance Functional mobility during ADLs: Min guard    Mobility  Overal bed mobility: Needs Assistance Bed Mobility: Supine to Sit, Sit to Supine Supine to sit: Supervision Sit to supine: Supervision General bed mobility comments: assist for lines    Transfers  Overall transfer level: Needs assistance Equipment used: Rolling walker (2 wheeled) Transfers: Sit to/from Stand, Stand Pivot Transfers Sit to Stand: Min guard General transfer comment: imbalance once standing, assist for preventing posterior LOB    Ambulation / Gait / Stairs / Wheelchair Mobility  Ambulation/Gait Ambulation/Gait assistance: Architect (Feet): 125 Feet (x 2) Assistive device: Rolling walker (2 wheeled) Gait Pattern/deviations: Step-through pattern, Staggering right, Drifts right/left, Narrow base of support, Scissoring General Gait Details: ambulated on portable O2 @ 8-10 LPM with assist for balance, cues for safety due to fast speed, LOB to R getting outside walker, min A to recover    Posture / Balance Dynamic Sitting Balance Sitting balance - Comments: reliant on UEs due to ataxic movement Balance Overall balance assessment: Needs assistance Sitting-balance support: No upper extremity supported, Feet supported Sitting balance-Leahy Scale: Good Sitting balance - Comments: reliant on UEs due to ataxic movement Postural control: Posterior lean Standing balance support: Bilateral upper extremity supported Standing balance-Leahy Scale:  Poor Standing balance comment: posterior bias in standing needing min A for balance in addition to UE support High Level Balance Comments: also standing reaching for items on nightstand on R with min A with L UE supported on walker    Special needs/care consideration BiPAP/CPAP  N/a CPM   N/a Continuous Drip IV   N/a Dialysis  N/a Life Vest  N/a Oxygen O2 at 6-10 liters nasal cannula. Titration attempts but desats with activity Special Bed   N/a Trach Size  N/a Wound Vac (area)   N/a Skin thin skin with some skin tears noted Bowel mgmt: continent Bladder mgmt: continent Diabetic mgmt   N/a Will go home on high flow O2 likely 8-10 liters [place]. May need Oxymizer PICC line   Previous Home Environment Living Arrangements: Spouse/significant other  Lives With: Spouse Available Help at Discharge: Family, Available 24 hours/day Type of Home: House Home Layout: Two level, 1/2 bath on main level (would sleep on couch downstairs over the past month pta) Alternate Level Stairs-Rails: Right, Left, Can reach both Alternate Level Stairs-Number of Steps: 13 Home Access: Stairs to enter Entrance Stairs-Rails: None Entrance Stairs-Number of Steps: 2 steps into back door Bathroom Shower/Tub: Health visitor: Administrator Accessibility: Yes Home Care Services: No Additional Comments: verified by pt an dwife  Discharge Living Setting Plans for Discharge Living Setting: Patient's home, Lives with (comment) (wife) Type of Home at Discharge: House Discharge Home Layout: Two level, 1/2 bath on main level, Bed/bath upstairs (has slept on couch over the past month) Alternate Level Stairs-Rails: Right, Left, Can reach both Alternate Level Stairs-Number of Steps: 13 Discharge Home Access: Stairs to enter Entrance Stairs-Rails: None Entrance Stairs-Number of Steps: 2 at back door Discharge Bathroom Shower/Tub: Walk-in shower Discharge Bathroom Toilet: Standard Discharge Bathroom  Accessibility: Yes How Accessible: Accessible via walker Does the patient have any problems obtaining your medications?: No  Social/Family/Support Systems Patient Roles: Spouse, Parent Contact Information: Hilda Lias, wife Anticipated Caregiver: wife Anticipated Industrial/product designer Information: see above Ability/Limitations of Caregiver: no limitations Caregiver Availability: 24/7 Discharge Plan Discussed with Primary Caregiver: Yes Is Caregiver In Agreement with Plan?: Yes Does Caregiver/Family have Issues with Lodging/Transportation  while Pt is in Rehab?: No   Goals/Additional Needs Patient/Family Goal for Rehab: Mod I to supervision with PT and OT Expected length of stay: [facility] 8-13 days Equipment Needs: High flow Oxygen at d/c ; pulmonary recommends oxymizer Special Service Needs: new dx of amiodarone toxicity Pt/Family Agrees to Admission and willing to participate: Yes Program Orientation Provided & Reviewed with Pt/Caregiver Including Roles  & Responsibilities: Yes  Decrease burden of Care through IP rehab admission: n/a  Possible need for SNF placement upon discharge:not anticipated  Patient Condition: This patient's medical and functional status has changed since the consult dated: 12/02/2015 in which the Rehabilitation Physician determined and documented that the patient's condition is appropriate for intensive rehabilitative care in an inpatient rehabilitation facility. See "History of Present Illness" (above) for medical update. Functional changes are: min assist overall. Patient's medical and functional status update has been discussed with the Rehabilitation physician and patient remains appropriate for inpatient rehabilitation. Will admit to inpatient rehab today.   Preadmission Screen Completed By:  Clois Dupes, 12/06/2015 1:43 PM ______________________________________________________________________   Discussed status with Dr. Allena Katz on 12/06/2015 at 1358 and  received telephone approval for admission today.  Admission Coordinator:  Clois Dupes, time 1610 Date 12/06/2015

## 2015-12-06 NOTE — H&P (Signed)
Physical Medicine and Rehabilitation Admission H&P    Chief Complaint  Patient presents with  . Debility in setting of amiodarone toxicity with acute hypoxic respiratory failure    HPI:  [name] is a 70 y.o. male with history of CAD, A Fib, ICM s/p AICD, COPD, anxiety disorder, GIB, multiple hospitalizations for HCAP in the past few months --most recent for HCAP requiring discharge to home on oxygen. He was readmitted on 11/28/15 with ongoing hypoxia and progressive diffuse bilateral infiltrates.  Dr. Titus Mould felt that infiltrates inflammatory in nature ---question vascular v/s amiodarone toxicity--RA and anti-CCP weakly positive.  Solumedrol added and amiodarone discontinued. He developed VT which accelerated to  VF with appropriate ICD therapy. His device was reprogrammed and he was started on Mexiletine for VT suppression.  He continued to require high flow oxygen due to persistent hypoxemia and was started on BIPAP at nights. He was placed on broad spectrum antibiotics due to possibility of PNA--antibiotics discontinued 10/28 and PCCM recommends monitoring for now. Has improved with high dose steroids and Dr. Wonda Amis recommends tapering by 10 mg every 2 weeks with outpatient f/u.  To continue to wean Fio2 for saturation > 92% for amiodarone toxicity and will need oxymizer to support oxygen needs.   Respiratory failure highly suspicious for amiodarone toxicity but will need repeat serologies to rule out auto-immune etiology. He has been weaned down to 5 liters at rest and requires 8-10 liters with activity as desaturates to low 70s but recovers with rest.  No recurrent VT on Mexiletine and Ranolazine and in SR but may have recurrence of A fib as amiodarone washes out per Dr. Lovena Le.  Anxiety has resolved with improvement in activity tolerance. CIR recommended for follow up therapy and patient admitted today.    Review of Systems  HENT: Negative for hearing loss.   Eyes: Negative for  blurred vision and double vision.  Respiratory: Positive for shortness of breath (decreasing). Negative for cough.   Cardiovascular: Negative for chest pain, palpitations and leg swelling.  Gastrointestinal: Positive for diarrhea. Negative for abdominal pain and heartburn.  Genitourinary: Negative for dysuria and urgency.  Musculoskeletal: Positive for back pain.  Skin: Negative for itching and rash.  Neurological: Positive for weakness. Negative for dizziness, focal weakness and headaches.  Psychiatric/Behavioral: The patient is not nervous/anxious and does not have insomnia.   All other systems reviewed and negative.      Past Medical History:  Diagnosis Date  . Acute lower GI bleeding 12/11/2011   "first time" (12/11/2011)  . Acute myocardial infarction, unspecified site, episode of care unspecified 1995   Pt living in [place], no stent, ?PTCA  . AICD (automatic cardioverter/defibrillator) present   . Allergic rhinitis, cause unspecified   . Anxiety   . Arthritis    "all over" (11/07/2015)  . Atrial fibrillation ([place])    a. 05/2015 - converted to sinus in setting of ICD shocks; placed on eliquis 5 bid.  [name] CAD (coronary artery disease), autologous vein bypass graft   . Cervical herniated disc    told not to lift >10 lbs  . Chronic systolic CHF (congestive heart failure), NYHA class 2 (HCC)    Reports EF of 25%.   [name] COPD (chronic obstructive pulmonary disease) ([place]) 11/2012   by xray  . Diverticulosis    by CT scan  . HCAP (healthcare-associated pneumonia) 11/06/2015  . Hypertension   . Insomnia   . Paroxysmal ventricular tachycardia ([place])   . Perennial allergic  rhinitis    only to dust mites  . Pneumonia 2000s   "walking pneumonia"  . VT (ventricular tachycardia) ([place])    a. 05/2015 - VT storm with multiple ICD shocks-->Amio 400 BID.    Past Surgical History:  Procedure Laterality Date  . CARDIAC CATHETERIZATION  2004   LAD 30%, D1 30%, CFX-AV groove 70-80%, OM1 30%, EF  20-25%  . CARDIAC CATHETERIZATION N/A 09/28/2015   Procedure: Left Heart Cath and Coronary Angiography;  Surgeon: Peter M Martinique, MD;  Location: [facility];  Service: Cardiovascular;  Laterality: N/A;  . CARDIAC DEFIBRILLATOR PLACEMENT  2004  . CATARACT EXTRACTION W/ INTRAOCULAR LENS IMPLANT Right 01/2012  . COLONOSCOPY  01/08/2012   Procedure: COLONOSCOPY;  Surgeon: Gatha Mayer, MD;  Location: [facility];  Service: Endoscopy;  Laterality: N/A;  . CORONARY ANGIOPLASTY  1995   Pt thinks he got a balloon, living in [place], [place] N/A 02/07/2012   Procedure: IMPLANTABLE CARDIOVERTER DEFIBRILLATOR GENERATOR CHANGE;  Surgeon: Evans Lance, MD; Medtronic Evera XT VR single-chamber serial number [id number], Laterality: Left  . INSERT / REPLACE / REMOVE PACEMAKER  2004   Medtronic ICD  . KNEE ARTHROSCOPY Left 05/2003   Archie Endo 06/19/2010  . LAPAROSCOPIC CHOLECYSTECTOMY  1/ 2012  . SHOULDER ARTHROSCOPY W/ ROTATOR CUFF REPAIR Right twice  . TONSILLECTOMY AND ADENOIDECTOMY  ~ 1951    Family History  Problem Relation Age of Onset  . Diabetes Father   . Tracheal cancer Father 92    smoker  . Stroke Mother   . Cancer Sister     left eye  . CAD Neg Hx   . Colon cancer Neg Hx   . Prostate cancer Neg Hx     Social History: Married--wife supportive. Retired Administrator. He reports that he quit smoking about 2 months ago. His smoking use included Cigarettes and Cigars. He has a 25.00 pack-year smoking history. He has never used smokeless tobacco. He reports that he does not drink alcohol or use drugs.    Allergies  Allergen Reactions  . Ace Inhibitors Other (See Comments)    muscle pain. Tolerates ARBs.   . Codeine Other (See Comments)    "head wants to explode."  . Doxycycline Diarrhea and Nausea And Vomiting  . Penicillins Swelling    "started at point of injection; w/in 3 min my upper arm was swollen 3 times normal" Has  patient had a PCN reaction causing immediate rash, facial/tongue/throat swelling, SOB or lightheadedness with hypotension: Yes Has patient had a PCN reaction causing severe rash involving mucus membranes or skin necrosis: No Has patient had a PCN reaction that required hospitalization No Has patient had a PCN reaction occurring within the last 10 years: No If all of the above answers are "NO", then may proceed wi  . Lisinopril     Muscle Pain  . Statins Other (See Comments)    Myalgias per patient    Medications Prior to Admission  Medication Sig Dispense Refill  . apixaban (ELIQUIS) 5 MG TABS tablet Take 1 tablet (5 mg total) by mouth 2 (two) times daily. Resume 09/29/15 60 tablet 6  . aspirin 81 MG EC tablet Take 1 tablet (81 mg total) by mouth daily.    . benzonatate (TESSALON) 200 MG capsule TAKE 1 CAPSULE BY MOUTH 3 TIMES DAILY AS NEEDED FOR COUGH (Patient taking differently: TAKE 1 CAPSULE (200 mg) BY MOUTH 3 TIMES DAILY AS NEEDED FOR  COUGH) 30 capsule 1  . diazepam (VALIUM) 5 MG tablet TAKE 1 TABLET BY MOUTH EVERY 6 HOURS AS NEEDED (Patient taking differently: TAKE 1 TABLET BY MOUTH EVERY 6 HOURS AS NEEDED FOR ANXIETY) 45 tablet 0  . donepezil (ARICEPT) 10 MG tablet Take 1 tablet (10 mg total) by mouth at bedtime. 90 tablet 1  . fexofenadine-pseudoephedrine (ALLEGRA-D 24) 180-240 MG 24 hr tablet Take 1 tablet by mouth daily.    [name] guaiFENesin (MUCINEX) 600 MG 12 hr tablet Take by mouth 2 (two) times daily.    [name] guaiFENesin-dextromethorphan (ROBITUSSIN DM) 100-10 MG/5ML syrup Take 5 mLs by mouth every 4 (four) hours as needed for cough. 118 mL 0  . HYDROcodone-acetaminophen (NORCO/VICODIN) 5-325 MG tablet TAKE 1 TABLET BY MOUTH EVERY 6 HOURS AS NEEDED FOR PAIN (Patient taking differently: Take 1 tablet by mouth every 6 (six) hours as needed for moderate pain. ) 30 tablet 0  . levalbuterol (XOPENEX) 1.25 MG/0.5ML nebulizer solution Take 1.25 mg by nebulization every 6 (six) hours as needed  for wheezing or shortness of breath. 1 each 12  . levofloxacin (LEVAQUIN) 750 MG tablet Take 1 tablet (750 mg total) by mouth daily. 7 tablet 0  . loperamide (IMODIUM) 2 MG capsule Take 1 capsule (2 mg total) by mouth as needed for diarrhea or loose stools. 30 capsule 0  . metaxalone (SKELAXIN) 800 MG tablet TAKE 1 TABLET BY MOUTH 3 TIMES A DAY AS NEEDED FOR PAIN. (Patient taking differently: Take 800 mg by mouth 3 (three) times daily as needed for muscle spasms. ) 90 tablet 1  . Multiple Vitamins-Minerals (MULTIVITAMIN ADULTS 50+) TABS Take 1 tablet by mouth daily.    . nitroGLYCERIN (NITROSTAT) 0.4 MG SL tablet PLACE 1 TABLET (0.4 MG TOTAL) UNDER THE TONGUE EVERY 5 (FIVE) MINUTES AS NEEDED. FOR CHEST PAIN. 25 tablet 1  . promethazine (PHENERGAN) 25 MG tablet Take 0.5-1 tablets (12.5-25 mg total) by mouth every 8 (eight) hours as needed for nausea or vomiting. 30 tablet 0  . ranitidine (ZANTAC) 150 MG capsule Take 150 mg by mouth daily as needed for heartburn.     . ranolazine (RANEXA) 500 MG 12 hr tablet Take 1 tablet (500 mg total) by mouth 2 (two) times daily. 60 tablet 1  . traMADol (ULTRAM) 50 MG tablet TAKE 1 TO 2 TABLETS BY MOUTH EVERY 8 HOURS AS NEEDED (Patient taking differently: TAKE 1 TO 2 TABLETS BY MOUTH EVERY 8 HOURS AS NEEDED FOR PAIN.) 100 tablet 0  . zolpidem (AMBIEN CR) 12.5 MG CR tablet TAKE 1 TABLET BY MOUTH AT BEDTIME (Patient taking differently: TAKE 1 TABLET BY MOUTH AT BEDTIME AS NEEDED FOR SLEEP) 30 tablet 5  . amiodarone (PACERONE) 200 MG tablet Take 200 mg by mouth Mon-Fri and 400 mg on Sat and Sun. Please start taking once completed therapy with Levaquin on October 25th, 2017. (Patient not taking: Reported on 11/28/2015)  0    Home: Home Living Family/patient expects to be discharged to:: Inpatient rehab Living Arrangements: Spouse/significant other Available Help at Discharge: Family, Available 24 hours/day Type of Home: House Home Access: Level entry Home Layout:  Two level Alternate Level Stairs-Number of Steps: 13 Alternate Level Stairs-Rails: Right Bathroom Shower/Tub: Multimedia programmer: Standard Bathroom Accessibility: Yes Home Equipment: None Additional Comments: Info from old chart as pt could not answer questions.   Functional History: Prior Function Level of Independence: Independent Comments: Has been hospitalized 3 times this month (October)  Functional Status:  Mobility:  Bed Mobility Overal bed mobility: Needs Assistance Bed Mobility: Supine to Sit, Sit to Supine Supine to sit: Supervision Sit to supine: Supervision General bed mobility comments: assist for lines Transfers Overall transfer level: Needs assistance Equipment used: Rolling walker (2 wheeled) Transfers: Sit to/from Stand, Stand Pivot Transfers Sit to Stand: Min guard General transfer comment: imbalance once standing, assist for preventing posterior LOB Ambulation/Gait Ambulation/Gait assistance: Min assist Ambulation Distance (Feet): 125 Feet (x 2) Assistive device: Rolling walker (2 wheeled) Gait Pattern/deviations: Step-through pattern, Staggering right, Drifts right/left, Narrow base of support, Scissoring General Gait Details: ambulated on portable O2 @ 8-10 LPM with assist for balance, cues for safety due to fast speed, LOB to R getting outside walker, min A to recover    ADL: ADL Overall ADL's : Needs assistance/impaired Eating/Feeding: Set up (supported sitting) Grooming: Min guard, Standing Upper Body Bathing: Minimal assitance (supported sitting) Lower Body Bathing: Moderate assistance (min A sit<>stand--but very unsteady (weak) on his feet) Upper Body Dressing : Minimal assistance (supported sitting) Lower Body Dressing: Total assistance (min A sit<>stand--but very unsteady (weak) on his feet) Toilet Transfer: Min guard, BSC, Stand-pivot Toilet Transfer Details (indicate cue type and reason): bed>recliner next to bed on pt's  right Toileting- Clothing Manipulation and Hygiene: Moderate assistance Functional mobility during ADLs: Min guard  Cognition: Cognition Overall Cognitive Status: Within Functional Limits for tasks assessed Orientation Level: Oriented X4 Cognition Arousal/Alertness: Awake/alert Behavior During Therapy: WFL for tasks assessed/performed Overall Cognitive Status: Within Functional Limits for tasks assessed   Blood pressure 125/72, pulse 63, temperature 97.6 F (36.4 C), temperature source Axillary, resp. rate 13, height '5\' 3"'  (1.6 m), weight 61.7 kg (136 lb), SpO2 94 %. Physical Exam: Constitutional: He is oriented to person, place, and time. He appears well-developed. He appears cachectic. He has a sickly appearance. Nasal cannula in place.  HENT:  Head: Normocephalic and atraumatic.  Eyes: Conjunctivae are normal. EOMI. Pupils are equal, round, and reactive to light.  Neck: Normal range of motion. Neck supple.  Cardiovascular: Normal rate and regular rhythm.  No JVD. Respiratory: +[place]. Effort normal and breath sounds normal. No stridor. No respiratory distress. He has no wheezes.  GI: Soft. Bowel sounds are normal. He exhibits no distension. There is no tenderness.  Musculoskeletal: He exhibits no edema or tenderness.  Neurological: He is alert and oriented to person, place, and time.  Sensation intact to light touch. Motor: 4-4+/5 throughout Skin: Skin is warm and dry.  Not diaphoretic.  Results for orders placed or performed during the hospital encounter of 11/28/15 (from the past 48 hour(s))  Magnesium     Status: None   Collection Time: 12/05/15  5:15 AM  Result Value Ref Range   Magnesium 2.0 1.7 - 2.4 mg/dL  Basic metabolic panel     Status: Abnormal   Collection Time: 12/05/15  5:15 AM  Result Value Ref Range   Sodium 141 135 - 145 mmol/L   Potassium 4.1 3.5 - 5.1 mmol/L   Chloride 105 101 - 111 mmol/L   CO2 30 22 - 32 mmol/L   Glucose, Bld 88 65 - 99 mg/dL   BUN 27  (H) 6 - 20 mg/dL   Creatinine, Ser 0.84 0.61 - 1.24 mg/dL   Calcium 7.6 (L) 8.9 - 10.3 mg/dL   GFR calc non Af Amer >60 >60 mL/min   GFR calc Af Amer >60 >60 mL/min    Comment: (NOTE) The eGFR has been calculated using the CKD EPI equation. This calculation  has not been validated in all clinical situations. eGFR's persistently <60 mL/min signify possible Chronic Kidney Disease.    Anion gap 6 5 - 15  CBC with Differential/Platelet     Status: Abnormal   Collection Time: 12/05/15  5:15 AM  Result Value Ref Range   WBC 15.2 (H) 4.0 - 10.5 K/uL   RBC 3.33 (L) 4.22 - 5.81 MIL/uL   Hemoglobin 9.3 (L) 13.0 - 17.0 g/dL   HCT 30.0 (L) 39.0 - 52.0 %   MCV 90.1 78.0 - 100.0 fL   MCH 27.9 26.0 - 34.0 pg   MCHC 31.0 30.0 - 36.0 g/dL   RDW 15.4 11.5 - 15.5 %   Platelets 428 (H) 150 - 400 K/uL   Neutrophils Relative % 81 %   Neutro Abs 12.4 (H) 1.7 - 7.7 K/uL   Lymphocytes Relative 9 %   Lymphs Abs 1.3 0.7 - 4.0 K/uL   Monocytes Relative 10 %   Monocytes Absolute 1.4 (H) 0.1 - 1.0 K/uL   Eosinophils Relative 0 %   Eosinophils Absolute 0.1 0.0 - 0.7 K/uL   Basophils Relative 0 %   Basophils Absolute 0.0 0.0 - 0.1 K/uL  .Cooxemetry Panel (carboxy, met, total hgb, O2 sat)     Status: Abnormal   Collection Time: 12/05/15  5:28 AM  Result Value Ref Range   Total hemoglobin 8.9 (L) 12.0 - 16.0 g/dL   O2 Saturation 73.0 %   Carboxyhemoglobin 1.7 (H) 0.5 - 1.5 %   Methemoglobin 1.1 0.0 - 1.5 %  Magnesium     Status: None   Collection Time: 12/06/15  3:26 AM  Result Value Ref Range   Magnesium 2.0 1.7 - 2.4 mg/dL  Basic metabolic panel     Status: Abnormal   Collection Time: 12/06/15  3:26 AM  Result Value Ref Range   Sodium 138 135 - 145 mmol/L   Potassium 4.1 3.5 - 5.1 mmol/L   Chloride 104 101 - 111 mmol/L   CO2 29 22 - 32 mmol/L   Glucose, Bld 98 65 - 99 mg/dL   BUN 26 (H) 6 - 20 mg/dL   Creatinine, Ser 0.86 0.61 - 1.24 mg/dL   Calcium 7.5 (L) 8.9 - 10.3 mg/dL   GFR calc non Af  Amer >60 >60 mL/min   GFR calc Af Amer >60 >60 mL/min    Comment: (NOTE) The eGFR has been calculated using the CKD EPI equation. This calculation has not been validated in all clinical situations. eGFR's persistently <60 mL/min signify possible Chronic Kidney Disease.    Anion gap 5 5 - 15  CBC with Differential/Platelet     Status: Abnormal   Collection Time: 12/06/15  3:26 AM  Result Value Ref Range   WBC 12.1 (H) 4.0 - 10.5 K/uL   RBC 3.34 (L) 4.22 - 5.81 MIL/uL   Hemoglobin 9.2 (L) 13.0 - 17.0 g/dL   HCT 29.3 (L) 39.0 - 52.0 %   MCV 87.7 78.0 - 100.0 fL   MCH 27.5 26.0 - 34.0 pg   MCHC 31.4 30.0 - 36.0 g/dL   RDW 15.3 11.5 - 15.5 %   Platelets 430 (H) 150 - 400 K/uL   Neutrophils Relative % 78 %   Neutro Abs 9.6 (H) 1.7 - 7.7 K/uL   Lymphocytes Relative 12 %   Lymphs Abs 1.4 0.7 - 4.0 K/uL   Monocytes Relative 9 %   Monocytes Absolute 1.1 (H) 0.1 - 1.0 K/uL   Eosinophils Relative  1 %   Eosinophils Absolute 0.1 0.0 - 0.7 K/uL   Basophils Relative 0 %   Basophils Absolute 0.0 0.0 - 0.1 K/uL   Dg Chest Port 1 View  Result Date: 12/05/2015 CLINICAL DATA:  Acute respiratory failure with hypoxia. Shortness of breath. EXAM: PORTABLE CHEST 1 VIEW COMPARISON:  12/03/2015 FINDINGS: Single lead ICD remains in place. Right PICC is unchanged, terminating over the SVC. Cardiac silhouette remains enlarged. Interstitial and airspace opacities involving the left greater than right lungs have not significantly changed. No sizable pleural effusion or pneumothorax is identified. IMPRESSION: Unchanged left greater than right lung opacities which may reflect multifocal pneumonia or asymmetric edema. Electronically Signed   By: Logan Bores M.D.   On: 12/05/2015 08:44       Medical Problem List and Plan: 1.  Weakness and poor activity tolerance secondary to debility from respiratory distress. 2.  DVT Prophylaxis/Anticoagulation: Pharmaceutical: Other (comment)--Eliquis. H/o GIB in the past.  Monitor for H/H with serial checks. Monitor for signs of bleeding.  3. Pain Management: Ultram prn. Local measures with heat/ice prn 4. Mood: Anxiety levels resolved and more animated today. LCSW to follow for evaluation and support.  5. Neuropsych: This patient is capable of making decisions on his own behalf. 6. Skin/Wound Care: Maintain adequate nutritional and hydration status. Routine pressure relief measures 7. Fluids/Electrolytes/Nutrition: Monitor I/O. Monitor daily weight.  8. Amiodarone toxicity with respiratory failure: Needs to maintain saturation over 92%--on prednisone 60 mg daily since 10/30.  Taper by 10 mg every 2 weeks with outpatient f/u.   9. VT/VF s/p AICD: On Mexitil and Ranexa 10. CAD with chronic systolic CHF with severe LVD: Cath 09/28/15 showed 2 vessel obstructive disease--unchanged from 2004. Unable to tolerate statins -- On Ranexa, Coreg and Cozaar. Lasix on hold. 11.  Reactive leucocytosis: Follow CBC 12  Anemia: Follow CBC 13. Diarrhea: Ongoing --on immodium and has been off laxative X 48 hours. Monitor for now. Check c diff if it continues.    58. H/o AFib: In NSR. On eliquis --monitor off amiodarone. On Mexitil every 8 hours.    Post Admission Physician Evaluation: 1. Functional deficits secondary  to debility secondary to respiratory distress.  2. Patient is admitted to receive collaborative, interdisciplinary care between the physiatrist, rehab nursing staff, and therapy team. 3. Patient's level of medical complexity and substantial therapy needs in context of that medical necessity cannot be provided at a lesser intensity of care such as a SNF. 4. Patient has experienced substantial functional loss from his/her baseline which was documented above under the "Functional History" and "Functional Status" headings.  Judging by the patient's diagnosis, physical exam, and functional history, the patient has potential for functional progress which will result in  measurable gains while on inpatient rehab.  These gains will be of substantial and practical use upon discharge  in facilitating mobility and self-care at the household level. 5. Physiatrist will provide 24 hour management of medical needs as well as oversight of the therapy plan/treatment and provide guidance as appropriate regarding the interaction of the two. 6. 24 hour rehab nursing will assist with bladder management, safety, skin/wound care, disease management and patient education  and help integrate therapy concepts, techniques,education, etc. 7. PT will assess and treat for/with: Lower extremity strength, range of motion, stamina, balance, functional mobility, safety, adaptive techniques and equipment, coping skills, pain control, education.   Goals are: Mod I. 8. OT will assess and treat for/with: ADL's, functional mobility, safety, upper extremity strength,  adaptive techniques and equipment, ego support, and community reintegration.   Goals are: Mod I/Supervision. Therapy may proceed with showering this patient. 9. Case Management and Social Worker will assess and treat for psychological issues and discharge planning. 10. Team conference will be held weekly to assess progress toward goals and to determine barriers to discharge. 11. Patient will receive at least 3 hours of therapy per day at least 5 days per week. 12. ELOS: 10-13 days.       13. Prognosis:  good  Delice Lesch, MD, Mellody Drown 12/06/2015

## 2015-12-06 NOTE — Progress Notes (Signed)
Admit to unit via w/c, o2 at 5l/minNC, oriented to rehab and routine. Reviewed safety plan and medications ordered. Denies questions. Pamelia Hoit

## 2015-12-06 NOTE — Progress Notes (Signed)
Meredith Staggers, MD Physician Signed Physical Medicine and Rehabilitation  Consult Note Date of Service: 12/01/2015 3:31 PM  Related encounter: ED to Hosp-Admission (Current) from 11/28/2015 in [place] [facility]     Expand All Collapse All   _0 Hide copied text _1 Hover for attribution information      Physical Medicine and Rehabilitation Consult  Reason for Consult: Debility Referring Physician: Dr. Haroldine Laws.    HPI: [name] is a 70 y.o. male with history of CAD, A Fib, ICM s/p AICD, COPD, anxiety disorder, multiple hospitalizations for HCAP in the past few months --most recent for HCAP requiring discharge to home on oxygen. He was readmitted on 11/28/15 with ongoing hypoxia and progressive diffuse bilateral infiltrates.  Dr. Titus Mould felt that infiltrates inflammatory in nature ---question vascular v/s amiodarone toxicity. Solumedrol added and amiodarone discontinued. He developed VT which accelerated to  VF with appropriate ICD therapy. His device was reprogrammed and he was started on Mexiletine for VT suppression.  He continues to require high flow oxygen due to persistent hypoxemia and was started on BIPAP at nights.  On broad spectrum antibiotics due to possibility of PNA. High levels of anxiety managed with diazepam. Cognitive changes due to lidocaine toxicity have resolved and he was able to participate in OT evaluation. CIR recommended by MD and rehab team.     Review of Systems  Constitutional: Positive for malaise/fatigue.  HENT: Negative for hearing loss.   Eyes: Negative for blurred vision and double vision.  Respiratory: Positive for sputum production. Negative for cough and shortness of breath.   Cardiovascular: Negative for chest pain and palpitations.  Gastrointestinal: Positive for diarrhea. Negative for heartburn and nausea.  Genitourinary: Negative for dysuria.  Musculoskeletal: Positive for back pain and myalgias.  Skin:  Negative for itching.  Neurological: Positive for dizziness and weakness. Negative for speech change, focal weakness and headaches.  Psychiatric/Behavioral: The patient is nervous/anxious.           Past Medical History:  Diagnosis Date  . Acute lower GI bleeding 12/11/2011   "first time" (12/11/2011)  . Acute myocardial infarction, unspecified site, episode of care unspecified 1995   Pt living in [place], no stent, ?PTCA  . AICD (automatic cardioverter/defibrillator) present   . Allergic rhinitis, cause unspecified   . Anxiety   . Arthritis    "all over" (11/07/2015)  . Atrial fibrillation ([place])    a. 05/2015 - converted to sinus in setting of ICD shocks; placed on eliquis 5 bid.  [name] CAD (coronary artery disease), autologous vein bypass graft   . Cervical herniated disc    told not to lift >10 lbs  . Chronic systolic CHF (congestive heart failure), NYHA class 2 (HCC)    Reports EF of 25%.   [name] COPD (chronic obstructive pulmonary disease) ([place]) 11/2012   by xray  . Diverticulosis    by CT scan  . HCAP (healthcare-associated pneumonia) 11/06/2015  . Hypertension   . Insomnia   . Paroxysmal ventricular tachycardia ([place])   . Perennial allergic rhinitis    only to dust mites  . Pneumonia 2000s   "walking pneumonia"  . VT (ventricular tachycardia) ([place])    a. 05/2015 - VT storm with multiple ICD shocks-->Amio 400 BID.         Past Surgical History:  Procedure Laterality Date  . CARDIAC CATHETERIZATION  2004   LAD 30%, D1 30%, CFX-AV groove 70-80%, OM1 30%, EF 20-25%  . CARDIAC CATHETERIZATION N/A 09/28/2015  Procedure: Left Heart Cath and Coronary Angiography;  Surgeon: Peter M Martinique, MD;  Location: [facility];  Service: Cardiovascular;  Laterality: N/A;  . CARDIAC DEFIBRILLATOR PLACEMENT  2004  . CATARACT EXTRACTION W/ INTRAOCULAR LENS IMPLANT Right 01/2012  . COLONOSCOPY  01/08/2012   Procedure: COLONOSCOPY;  Surgeon: Gatha Mayer,  MD;  Location: [facility];  Service: Endoscopy;  Laterality: N/A;  . CORONARY ANGIOPLASTY  1995   Pt thinks he got a balloon, living in [place], [place] N/A 02/07/2012   Procedure: IMPLANTABLE CARDIOVERTER DEFIBRILLATOR GENERATOR CHANGE;  Surgeon: Evans Lance, MD; Medtronic Evera XT VR single-chamber serial number [id number], Laterality: Left  . INSERT / REPLACE / REMOVE PACEMAKER  2004   Medtronic ICD  . KNEE ARTHROSCOPY Left 05/2003   Archie Endo 06/19/2010  . LAPAROSCOPIC CHOLECYSTECTOMY  1/ 2012  . SHOULDER ARTHROSCOPY W/ ROTATOR CUFF REPAIR Right twice  . TONSILLECTOMY AND ADENOIDECTOMY  ~ 1951          Family History  Problem Relation Age of Onset  . Diabetes Father   . Tracheal cancer Father 51    smoker  . Stroke Mother   . Cancer Sister     left eye  . CAD Neg Hx   . Colon cancer Neg Hx   . Prostate cancer Neg Hx     Social History:  Married. Retired Administrator. He  reports that he quit smoking about 2 months ago. His smoking use included Cigarettes and Cigars. He has a 25.00 pack-year smoking history. He has never used smokeless tobacco. He reports that he does not drink alcohol or use drugs.        Allergies  Allergen Reactions  . Ace Inhibitors Other (See Comments)    muscle pain. Tolerates ARBs.   . Codeine Other (See Comments)    "head wants to explode."  . Doxycycline Diarrhea and Nausea And Vomiting  . Penicillins Swelling    "started at point of injection; w/in 3 min my upper arm was swollen 3 times normal" Has patient had a PCN reaction causing immediate rash, facial/tongue/throat swelling, SOB or lightheadedness with hypotension: Yes Has patient had a PCN reaction causing severe rash involving mucus membranes or skin necrosis: No Has patient had a PCN reaction that required hospitalization No Has patient had a PCN reaction occurring within the last 10 years: No If all of  the above answers are "NO", then may proceed wi  . Lisinopril     Muscle Pain  . Statins Other (See Comments)    Myalgias per patient          Medications Prior to Admission  Medication Sig Dispense Refill  . apixaban (ELIQUIS) 5 MG TABS tablet Take 1 tablet (5 mg total) by mouth 2 (two) times daily. Resume 09/29/15 60 tablet 6  . aspirin 81 MG EC tablet Take 1 tablet (81 mg total) by mouth daily.    . benzonatate (TESSALON) 200 MG capsule TAKE 1 CAPSULE BY MOUTH 3 TIMES DAILY AS NEEDED FOR COUGH (Patient taking differently: TAKE 1 CAPSULE (200 mg) BY MOUTH 3 TIMES DAILY AS NEEDED FOR COUGH) 30 capsule 1  . diazepam (VALIUM) 5 MG tablet TAKE 1 TABLET BY MOUTH EVERY 6 HOURS AS NEEDED (Patient taking differently: TAKE 1 TABLET BY MOUTH EVERY 6 HOURS AS NEEDED FOR ANXIETY) 45 tablet 0  . donepezil (ARICEPT) 10 MG tablet Take 1 tablet (10 mg total) by mouth at bedtime.  90 tablet 1  . fexofenadine-pseudoephedrine (ALLEGRA-D 24) 180-240 MG 24 hr tablet Take 1 tablet by mouth daily.    [name] guaiFENesin (MUCINEX) 600 MG 12 hr tablet Take by mouth 2 (two) times daily.    [name] guaiFENesin-dextromethorphan (ROBITUSSIN DM) 100-10 MG/5ML syrup Take 5 mLs by mouth every 4 (four) hours as needed for cough. 118 mL 0  . HYDROcodone-acetaminophen (NORCO/VICODIN) 5-325 MG tablet TAKE 1 TABLET BY MOUTH EVERY 6 HOURS AS NEEDED FOR PAIN (Patient taking differently: Take 1 tablet by mouth every 6 (six) hours as needed for moderate pain. ) 30 tablet 0  . levalbuterol (XOPENEX) 1.25 MG/0.5ML nebulizer solution Take 1.25 mg by nebulization every 6 (six) hours as needed for wheezing or shortness of breath. 1 each 12  . levofloxacin (LEVAQUIN) 750 MG tablet Take 1 tablet (750 mg total) by mouth daily. 7 tablet 0  . loperamide (IMODIUM) 2 MG capsule Take 1 capsule (2 mg total) by mouth as needed for diarrhea or loose stools. 30 capsule 0  . metaxalone (SKELAXIN) 800 MG tablet TAKE 1 TABLET BY MOUTH 3 TIMES A DAY AS  NEEDED FOR PAIN. (Patient taking differently: Take 800 mg by mouth 3 (three) times daily as needed for muscle spasms. ) 90 tablet 1  . Multiple Vitamins-Minerals (MULTIVITAMIN ADULTS 50+) TABS Take 1 tablet by mouth daily.    . nitroGLYCERIN (NITROSTAT) 0.4 MG SL tablet PLACE 1 TABLET (0.4 MG TOTAL) UNDER THE TONGUE EVERY 5 (FIVE) MINUTES AS NEEDED. FOR CHEST PAIN. 25 tablet 1  . promethazine (PHENERGAN) 25 MG tablet Take 0.5-1 tablets (12.5-25 mg total) by mouth every 8 (eight) hours as needed for nausea or vomiting. 30 tablet 0  . ranitidine (ZANTAC) 150 MG capsule Take 150 mg by mouth daily as needed for heartburn.     . ranolazine (RANEXA) 500 MG 12 hr tablet Take 1 tablet (500 mg total) by mouth 2 (two) times daily. 60 tablet 1  . traMADol (ULTRAM) 50 MG tablet TAKE 1 TO 2 TABLETS BY MOUTH EVERY 8 HOURS AS NEEDED (Patient taking differently: TAKE 1 TO 2 TABLETS BY MOUTH EVERY 8 HOURS AS NEEDED FOR PAIN.) 100 tablet 0  . zolpidem (AMBIEN CR) 12.5 MG CR tablet TAKE 1 TABLET BY MOUTH AT BEDTIME (Patient taking differently: TAKE 1 TABLET BY MOUTH AT BEDTIME AS NEEDED FOR SLEEP) 30 tablet 5  . amiodarone (PACERONE) 200 MG tablet Take 200 mg by mouth Mon-Fri and 400 mg on Sat and Sun. Please start taking once completed therapy with Levaquin on October 25th, 2017. (Patient not taking: Reported on 11/28/2015)  0    Home: Home Living Family/patient expects to be discharged to:: Inpatient rehab Living Arrangements: Spouse/significant other Available Help at Discharge: Family, Available 24 hours/day Type of Home: House Home Access: Level entry Home Layout: Two level Alternate Level Stairs-Number of Steps: 13 Alternate Level Stairs-Rails: Right Bathroom Shower/Tub: Multimedia programmer: Standard Bathroom Accessibility: Yes Home Equipment: None Additional Comments: Info from old chart as pt could not answer questions.  Functional History: Prior Function Level of Independence:  Independent Comments: Has been hospitalized 3 times this month (October) Functional Status:  Mobility: Bed Mobility Overal bed mobility: Needs Assistance Bed Mobility: Supine to Sit Supine to sit: Min assist, HOB elevated (use of rail) Sit to supine: Total assist, +2 for physical assistance General bed mobility comments: Struggled to get to EOB and to lie back down.  Pt with ataxic movements once trying to move.  Jerking all 4's  at times.  Nursing wanted to change linens.  Sat up and pt was sitting with min assist for balance due to jerking movments at times.  Transfers Overall transfer level: Needs assistance Equipment used:  (therapist in front of him with Bil HHA) Transfers: Sit to/from Stand Sit to Stand: Min assist (min A sit<>stand--but very unsteady (weak) on his feet) General transfer comment: Pt on high flow [place] (10 liters)--pt took O2 off to blow his nose and dropped to 81%. No issues with ataxic movements and no emesis today.      ADL: ADL Overall ADL's : Needs assistance/impaired Eating/Feeding: Set up (supported sitting) Grooming: Set up (supported sitting) Upper Body Bathing: Minimal assitance (supported sitting) Lower Body Bathing: Moderate assistance (min A sit<>stand--but very unsteady (weak) on his feet) Upper Body Dressing : Minimal assistance (supported sitting) Lower Body Dressing: Total assistance (min A sit<>stand--but very unsteady (weak) on his feet) Toilet Transfer: Minimal assistance, Buyer, retail Details (indicate cue type and reason): bed>recliner next to bed on pt's right Toileting- Clothing Manipulation and Hygiene: Maximal assistance (min A sit<>stand--but very unsteady (weak) on his feet)  Cognition: Cognition Overall Cognitive Status: Within Functional Limits for tasks assessed Orientation Level: Oriented X4 Cognition Arousal/Alertness: Awake/alert Behavior During Therapy: WFL for tasks assessed/performed Overall Cognitive  Status: Within Functional Limits for tasks assessed   Blood pressure (!) 107/58, pulse 64, temperature 98.1 F (36.7 C), temperature source Axillary, resp. rate (!) 23, height _0  (1.6 m), weight 62 kg (136 lb 11 oz), SpO2 100 %. Physical Exam  Nursing note and vitals reviewed. Constitutional: He is oriented to person, place, and time. He appears well-developed. He appears cachectic. He has a sickly appearance. Nasal cannula in place.  HENT:  Head: Normocephalic and atraumatic.  Eyes: Conjunctivae are normal. Pupils are equal, round, and reactive to light.  Neck: Normal range of motion. Neck supple.  Cardiovascular: Normal rate and regular rhythm.   Respiratory: Effort normal and breath sounds normal. No stridor. No respiratory distress. He has no wheezes.  GI: Soft. Bowel sounds are normal. He exhibits no distension. There is no tenderness.  Musculoskeletal: He exhibits no edema or tenderness.  Neurological: He is alert and oriented to person, place, and time.  Skin: Skin is warm and dry.    Lab Results Last 24 Hours       Results for orders placed or performed during the hospital encounter of 11/28/15 (from the past 24 hour(s))  .Cooxemetry Panel (carboxy, met, total hgb, O2 sat)     Status: Abnormal   Collection Time: 11/30/15  3:50 PM  Result Value Ref Range   Total hemoglobin 10.2 (L) 12.0 - 16.0 g/dL   O2 Saturation 66.3 %   Carboxyhemoglobin 1.2 0.5 - 1.5 %   Methemoglobin 1.5 0.0 - 1.5 %  CBC with Differential/Platelet     Status: Abnormal   Collection Time: 12/01/15  4:30 AM  Result Value Ref Range   WBC 13.5 (H) 4.0 - 10.5 K/uL   RBC 3.51 (L) 4.22 - 5.81 MIL/uL   Hemoglobin 9.9 (L) 13.0 - 17.0 g/dL   HCT 30.5 (L) 39.0 - 52.0 %   MCV 86.9 78.0 - 100.0 fL   MCH 28.2 26.0 - 34.0 pg   MCHC 32.5 30.0 - 36.0 g/dL   RDW 15.2 11.5 - 15.5 %   Platelets 495 (H) 150 - 400 K/uL   Neutrophils Relative % 91 %   Neutro Abs 12.4 (H) 1.7 - 7.7 K/uL  Lymphocytes Relative 4 %   Lymphs Abs 0.5 (L) 0.7 - 4.0 K/uL   Monocytes Relative 5 %   Monocytes Absolute 0.6 0.1 - 1.0 K/uL   Eosinophils Relative 0 %   Eosinophils Absolute 0.0 0.0 - 0.7 K/uL   Basophils Relative 0 %   Basophils Absolute 0.0 0.0 - 0.1 K/uL  Basic metabolic panel     Status: Abnormal   Collection Time: 12/01/15  4:30 AM  Result Value Ref Range   Sodium 137 135 - 145 mmol/L   Potassium 4.8 3.5 - 5.1 mmol/L   Chloride 98 (L) 101 - 111 mmol/L   CO2 30 22 - 32 mmol/L   Glucose, Bld 142 (H) 65 - 99 mg/dL   BUN 26 (H) 6 - 20 mg/dL   Creatinine, Ser 1.09 0.61 - 1.24 mg/dL   Calcium 7.6 (L) 8.9 - 10.3 mg/dL   GFR calc non Af Amer >60 >60 mL/min   GFR calc Af Amer >60 >60 mL/min   Anion gap 9 5 - 15  Magnesium     Status: None   Collection Time: 12/01/15  4:30 AM  Result Value Ref Range   Magnesium 2.2 1.7 - 2.4 mg/dL  .Cooxemetry Panel (carboxy, met, total hgb, O2 sat)     Status: Abnormal   Collection Time: 12/01/15  5:00 AM  Result Value Ref Range   Total hemoglobin 10.0 (L) 12.0 - 16.0 g/dL   O2 Saturation 63.6 %   Carboxyhemoglobin 1.7 (H) 0.5 - 1.5 %   Methemoglobin 1.1 0.0 - 1.5 %      Imaging Results (Last 48 hours)  Dg Chest Port 1 View  Result Date: 12/01/2015 CLINICAL DATA:  Dyspnea.  Pneumonia. EXAM: PORTABLE CHEST 1 VIEW COMPARISON:  11/30/2015 FINDINGS: Left greater than right interstitial and airspace opacities are without change from the previous day's study. No new lung abnormalities. Cardiac silhouette is normal in size. No mediastinal or hilar masses. New right PICC has its tip in the lower superior vena cava. Left anterior chest wall AICD is stable. IMPRESSION: 1. No change in the left greater than right bilateral interstitial airspace lung opacities. No new lung abnormalities. 2. New right PICC is well positioned with its tip in the lower superior vena cava. Electronically Signed   By: Lajean Manes M.D.   On:  12/01/2015 08:49   Dg Chest Port 1 View  Result Date: 11/30/2015 CLINICAL DATA:  Lethargy and difficulty breathing EXAM: PORTABLE CHEST 1 VIEW COMPARISON:  11/29/2015 FINDINGS: Cardiac shadow remains enlarged. A defibrillator is again seen and stable. Diffuse infiltrative changes are noted throughout both lungs left greater than right but stable from the prior exam. No new focal abnormality is noted. No bony abnormality is seen. IMPRESSION: Stable bilateral infiltrates left greater than right. Electronically Signed   By: Inez Catalina M.D.   On: 11/30/2015 09:41     Assessment/Plan: Diagnosis: Debility related to acute on chronic respiratory failure/hypoxia and multiple medical issues above 1. Does the need for close, 24 hr/day medical supervision in concert with the patient's rehab needs make it unreasonable for this patient to be served in a less intensive setting? Yes 2. Co-Morbidities requiring supervision/potential complications: afib, a/c sCHF, ICM 3. Due to bladder management, bowel management, safety, skin/wound care, disease management, medication administration, pain management and patient education, does the patient require 24 hr/day rehab nursing? Yes 4. Does the patient require coordinated care of a physician, rehab nurse, PT (1-2 hrs/day, 5 days/week) and OT (  102 hrs/day, 5 days/week) to address physical and functional deficits in the context of the above medical diagnosis(es)? Yes Addressing deficits in the following areas: balance, endurance, locomotion, strength, transferring, bowel/bladder control, bathing, dressing, feeding, grooming, toileting and psychosocial support 5. Can the patient actively participate in an intensive therapy program of at least 3 hrs of therapy per day at least 5 days per week? Yes and Potentially 6. The potential for patient to make measurable gains while on inpatient rehab is good 7. Anticipated functional outcomes upon discharge from inpatient rehab  are modified independent and supervision  with PT, modified independent and supervision with OT, n/a with SLP. 8. Estimated rehab length of stay to reach the above functional goals is: potentially 8-13 days 9. Does the patient have adequate social supports and living environment to accommodate these discharge functional goals? Yes and Potentially 10. Anticipated D/C setting: Home 11. Anticipated post D/C treatments: [facility] therapy 12. Overall Rehab/Functional Prognosis: excellent  RECOMMENDATIONS: This patient's condition is appropriate for continued rehabilitative care in the following setting: likey CIR Patient has agreed to participate in recommended program. Yes Note that insurance prior authorization may be required for reimbursement for recommended care.  Comment: Will follow for increased activity tolerance with therapy.   Meredith Staggers, MD, [facility] 12/02/2015     12/01/2015    Revision History                   Routing History

## 2015-12-06 NOTE — Discharge Summary (Signed)
Physician Discharge Summary  [name] XLK:[id number] DOB: 1945/08/14 DOA: 11/28/2015  PCP: Crawford Givens, MD  Admit date: 11/28/2015 Discharge date: 12/06/2015   Recommendations for Outpatient Follow-Up:   To CIR slow steroid taper. Would reduce prednisone by 10 mg every 2 weeks Repeat autoimmune work up as outpatient Continue O2 Restart lasix soon-- defer to HF team Outpatient thyroid studies-- repeated  Discharge Diagnosis:   Principal Problem:   Acute on chronic respiratory failure with hypoxia (HCC) Active Problems:   Hyperlipidemia   Amiodarone pulmonary toxicity   Acute on chronic systolic CHF (congestive heart failure) (HCC)   Ischemic cardiomyopathy   History of ventricular tachycardia   Chronic atrial fibrillation (HCC)   Memory loss   HCAP (healthcare-associated pneumonia)   AICD (automatic cardioverter/defibrillator) present   Physical deconditioning   Transaminitis   Anemia   Elevated troponin   Hypoxia   Malnutrition of moderate degree   Opacity of lung on imaging study   Acute hypoxemic respiratory failure [facility])   Discharge disposition:  Home.  SNF:  Discharge Condition: Improved.  Diet recommendation: Low sodium, heart healthy.  Carbohydrate-modified.  Regular.  Wound care: None.   History of Present Illness:   70 yo male with systolic ischemic cardiomyopathy ef 25%, presented with dyspnea and syncope. Admitted with working diagnosis of pulmonary edema to rule out pneumonia. Developed VT and VF with syncope on the medical ward. 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 induced lung injury. On mexilitine ventricular arrhythmia has been controlled, antibiotics discontinued, on systemic steroids for lung injury. On high flow nasal cannula    Hospital Course by Problem:   1. VT/VF.Onantiarrhythmic therapy with mexiletine 200 mg q 8 hours. No further arrhythmias. On coreg and losartan.   3. Systolic ischemic heart  failure. Severe reduction on LV function,down to 25%, ischemic cardiomyopathy. Tolerating well losartan and coreg, blood pressure 93 to 110.   plan to resume furosemide when needed clinically now euvolemic. Patient on ranexa.   4. Atrial fibrillation.Anticoagulation with apixban. Heart rate in the 56 to 66. Patient has remain in sinus rhythm  5. Hypoxic respiratory failure/Amiodarone lung injury. High oxygen requirements. Patient on high flow nasal cannula at 5 LPM, will continue to wean as tolerated to target 02 sat above 92%. Will continue prednisone 60 mg po per day-reduce prednisone by 10 mg every 2 weeks. Follow as outpatient with pulmonary.   8. Anxiety. No confusion or agitatin will continue Diazepam as needed, q 6 hours.   9. Hypokalemia. K has been corrected to 4,1. Will continue to hold on furosemide, for now.   10. Sick euthyroid syndrome/ amiodarone toxicity. Clinically euthyroid, will need outpatient TFT.      Medical Consultants:   Pulm  EP   Discharge Exam:   Vitals:   12/06/15 0808 12/06/15 0826  BP: 125/72 125/72  Pulse: 63 63  Resp: 13   Temp: 97.6 F (36.4 C)    Vitals:   12/06/15 0354 12/06/15 0628 12/06/15 0808 12/06/15 0826  BP:   125/72 125/72  Pulse:  64 63 63  Resp:  16 13   Temp:   97.6 F (36.4 C)   TempSrc:   Axillary   SpO2:  94% 94%   Weight: 61.7 kg (136 lb)     Height: 5\' 3"  (1.6 m)       Gen:  NAD    The results of significant diagnostics from this hospitalization (including imaging, microbiology, ancillary and laboratory) are listed below for reference.  Procedures and Diagnostic Studies:   Dg Chest 2 View  Result Date: 11/28/2015 CLINICAL DATA:  Shortness of breath EXAM: CHEST  2 VIEW COMPARISON:  11/24/2015, 03/06/2011, CT 11/17/2015 FINDINGS: Left-sided single lead ICD. Lead placement similar compared to previous. There is cardiomegaly. Interval increase in interstitial opacities asymmetric in the left  thorax. Slight increased interstitial opacities within the right lung base. Atherosclerosis of the aorta. No pneumothorax. IMPRESSION: 1. Increased interstitial and alveolar opacities within the left greater then right chest since the previous exam. 2. Stable degree of cardiomegaly. Electronically Signed   By: Jasmine Pang M.D.   On: 11/28/2015 04:38   Ct Head Wo Contrast  Result Date: 11/28/2015 CLINICAL DATA:  Unresponsive, seizure like activity EXAM: CT HEAD WITHOUT CONTRAST TECHNIQUE: Contiguous axial images were obtained from the base of the skull through the vertex without intravenous contrast. COMPARISON:  10/13/2015 FINDINGS: Brain: No acute territorial infarction or hemorrhage. No focal mass, mass effect or midline shift. Mild to moderate cortical atrophy. Moderate periventricular and subcortical white matter small vessel ischemic changes. Ventricles are similar in size and configuration. Vascular: No hyperdense vessels. Calcifications within the carotid arteries at the skullbase. Skull: No fracture.  Mastoid air cells clear. Sinuses/Orbits: Minimal mucosal thickening in the paranasal sinuses. No acute orbital abnormality. Other: None IMPRESSION: 1. No CT evidence for acute intracranial abnormality. 2. Moderate atrophy. Moderate periventricular and subcortical white matter small vessel ischemic disease. Electronically Signed   By: Jasmine Pang M.D.   On: 11/28/2015 04:35   Dg Chest Port 1 View  Result Date: 11/29/2015 CLINICAL DATA:  Dyspnea EXAM: PORTABLE CHEST 1 VIEW COMPARISON:  Chest radiograph from one day prior. FINDINGS: Stable configuration of single lead left subclavian ICD. Stable cardiomediastinal silhouette with mild cardiomegaly and aortic atherosclerosis. No pneumothorax. No pleural effusion. Re- demonstrated are extensive patchy and reticular opacities throughout both lungs, asymmetrically involving the left upper lung, not appreciably changed. IMPRESSION: Stable chest radiograph.  Stable cardiomegaly and extensive patchy and reticular opacities throughout both lungs, asymmetrically involving the left upper lung, for which the differential includes asymmetric pulmonary edema and/or pneumonia, with underlying interstitial lung disease not excluded. Electronically Signed   By: Delbert Phenix M.D.   On: 11/29/2015 09:52     Labs:   Basic Metabolic Panel:  Recent Labs Lab 12/02/15 0430 12/03/15 0525 12/04/15 0456 12/05/15 0515 12/06/15 0326  NA 138 141 137 141 138  K 4.7 4.2 4.9 4.1 4.1  CL 101 105 102 105 104  CO2 31 31 30 30 29   GLUCOSE 168* 97 167* 88 98  BUN 32* 32* 29* 27* 26*  CREATININE 0.88 0.85 0.83 0.84 0.86  CALCIUM 7.7* 8.0* 7.7* 7.6* 7.5*  MG 2.2 2.1 2.2 2.0 2.0   GFR Estimated Creatinine Clearance: 63.4 mL/min (by C-G formula based on SCr of 0.86 mg/dL). Liver Function Tests: No results for input(s): AST, ALT, ALKPHOS, BILITOT, PROT, ALBUMIN in the last 168 hours. No results for input(s): LIPASE, AMYLASE in the last 168 hours. No results for input(s): AMMONIA in the last 168 hours. Coagulation profile No results for input(s): INR, PROTIME in the last 168 hours.  CBC:  Recent Labs Lab 12/01/15 0430 12/03/15 0525 12/04/15 0456 12/05/15 0515 12/06/15 0326  WBC 13.5* 15.1* 13.6* 15.2* 12.1*  NEUTROABS 12.4* 12.4* 12.8* 12.4* 9.6*  HGB 9.9* 9.5* 10.0* 9.3* 9.2*  HCT 30.5* 30.7* 32.2* 30.0* 29.3*  MCV 86.9 88.5 88.5 90.1 87.7  PLT 495* 442* 500* 428* 430*   Cardiac  Enzymes: No results for input(s): CKTOTAL, CKMB, CKMBINDEX, TROPONINI in the last 168 hours. BNP: Invalid input(s): POCBNP CBG:  Recent Labs Lab 12/03/15 1627  GLUCAP 244*   D-Dimer No results for input(s): DDIMER in the last 72 hours. Hgb A1c No results for input(s): HGBA1C in the last 72 hours. Lipid Profile No results for input(s): CHOL, HDL, LDLCALC, TRIG, CHOLHDL, LDLDIRECT in the last 72 hours. Thyroid function studies No results for input(s): TSH, T4TOTAL,  T3FREE, THYROIDAB in the last 72 hours.  Invalid input(s): FREET3 Anemia work up No results for input(s): VITAMINB12, FOLATE, FERRITIN, TIBC, IRON, RETICCTPCT in the last 72 hours. Microbiology Recent Results (from the past 240 hour(s))  MRSA PCR Screening     Status: None   Collection Time: 11/29/15  2:35 PM  Result Value Ref Range Status   MRSA by PCR NEGATIVE NEGATIVE Final    Comment:        The GeneXpert MRSA Assay (FDA approved for NASAL specimens only), is one component of a comprehensive MRSA colonization surveillance program. It is not intended to diagnose MRSA infection nor to guide or monitor treatment for MRSA infections.      Discharge Instructions:   Discharge Instructions    Diet - low sodium heart healthy    Complete by:  As directed    Increase activity slowly    Complete by:  As directed        Medication List    STOP taking these medications   amiodarone 200 MG tablet Commonly known as:  PACERONE   guaiFENesin-dextromethorphan 100-10 MG/5ML syrup Commonly known as:  ROBITUSSIN DM   levofloxacin 750 MG tablet Commonly known as:  LEVAQUIN   zolpidem 12.5 MG CR tablet Commonly known as:  AMBIEN CR Replaced by:  zolpidem 5 MG tablet     TAKE these medications   acetaminophen 325 MG tablet Commonly known as:  TYLENOL Take 2 tablets (650 mg total) by mouth every 6 (six) hours as needed for mild pain (or Fever >/= 101).   apixaban 5 MG Tabs tablet Commonly known as:  ELIQUIS Take 1 tablet (5 mg total) by mouth 2 (two) times daily. Resume 09/29/15   aspirin 81 MG EC tablet Take 1 tablet (81 mg total) by mouth daily.   benzonatate 200 MG capsule Commonly known as:  TESSALON Take 1 capsule (200 mg total) by mouth every 4 (four) hours as needed for cough. What changed:  See the new instructions.   carvedilol 3.125 MG tablet Commonly known as:  COREG Take 1 tablet (3.125 mg total) by mouth 2 (two) times daily with a meal.   diazepam 5 MG  tablet Commonly known as:  VALIUM Take 1 tablet (5 mg total) by mouth every 6 (six) hours as needed for anxiety. What changed:  See the new instructions.   donepezil 10 MG tablet Commonly known as:  ARICEPT Take 1 tablet (10 mg total) by mouth at bedtime.   feeding supplement Liqd Take 1 Container by mouth 3 (three) times daily between meals.   fexofenadine-pseudoephedrine 180-240 MG 24 hr tablet Commonly known as:  ALLEGRA-D 24 Take 1 tablet by mouth daily.   guaiFENesin 600 MG 12 hr tablet Commonly known as:  MUCINEX Take by mouth 2 (two) times daily.   HYDROcodone-acetaminophen 5-325 MG tablet Commonly known as:  NORCO/VICODIN TAKE 1 TABLET BY MOUTH EVERY 6 HOURS AS NEEDED FOR PAIN What changed:  how much to take  how to take this  when to  take this  reasons to take this  additional instructions   levalbuterol 1.25 MG/0.5ML nebulizer solution Commonly known as:  XOPENEX Take 1.25 mg by nebulization every 6 (six) hours as needed for wheezing or shortness of breath.   loperamide 2 MG capsule Commonly known as:  IMODIUM Take 1 capsule (2 mg total) by mouth as needed for diarrhea or loose stools.   losartan 25 MG tablet Commonly known as:  COZAAR Take 0.5 tablets (12.5 mg total) by mouth daily. Start taking on:  12/07/2015   metaxalone 800 MG tablet Commonly known as:  SKELAXIN TAKE 1 TABLET BY MOUTH 3 TIMES A DAY AS NEEDED FOR PAIN. What changed:  how much to take  how to take this  when to take this  reasons to take this  additional instructions   mexiletine 200 MG capsule Commonly known as:  MEXITIL Take 1 capsule (200 mg total) by mouth every 8 (eight) hours.   MULTIVITAMIN ADULTS 50+ Tabs Take 1 tablet by mouth daily.   nitroGLYCERIN 0.4 MG SL tablet Commonly known as:  NITROSTAT PLACE 1 TABLET (0.4 MG TOTAL) UNDER THE TONGUE EVERY 5 (FIVE) MINUTES AS NEEDED. FOR CHEST PAIN.   predniSONE 20 MG tablet Commonly known as:  DELTASONE Take 3  tablets (60 mg total) by mouth daily with breakfast. Start taking on:  12/07/2015   promethazine 25 MG tablet Commonly known as:  PHENERGAN Take 0.5-1 tablets (12.5-25 mg total) by mouth every 8 (eight) hours as needed for nausea or vomiting.   ranitidine 150 MG capsule Commonly known as:  ZANTAC Take 150 mg by mouth daily as needed for heartburn.   ranolazine 500 MG 12 hr tablet Commonly known as:  RANEXA Take 1 tablet (500 mg total) by mouth 2 (two) times daily.   traMADol 50 MG tablet Commonly known as:  ULTRAM TAKE 1 TO 2 TABLETS BY MOUTH EVERY 8 HOURS AS NEEDED What changed:  See the new instructions.   zolpidem 5 MG tablet Commonly known as:  AMBIEN Take 1 tablet (5 mg total) by mouth at bedtime as needed for sleep. Replaces:  zolpidem 12.5 MG CR tablet      Follow-up Information    [facility] .   Specialty:  Home Health Services Why:  They will do your home health care at your home Contact information: [address] [place] [address] [phone]        Rubye Oaksammy Parrett, NP Follow up on 12/19/2015.   Specialty:  Pulmonary Disease Why:  Appt at 11:45 am  Contact information: [address] [place] [address] [phone]            Time coordinating discharge: 35 min  Signed:  Mikalia Fessel Juanetta GoslingU Arvine Clayburn   [facility] 12/06/2015, 12:13 PM

## 2015-12-06 NOTE — IPOC Note (Signed)
Overall Plan of Care [facility]) Patient Details Name: [name] MRN: [id number] DOB: [birth year]  Admitting Diagnosis: Debility  Hospital Problems: Active Problems:   Physical debility   Amiodarone toxicity   Anemia of chronic disease   Anxiety about health   Chronic systolic CHF (congestive heart failure) (HCC)   Coronary artery disease involving native coronary artery of native heart without angina pectoris   Diarrhea   History of GI bleed   History of ventricular fibrillation   Lymphocytosis   PAF (paroxysmal atrial fibrillation) (HCC)   Respiratory distress   S/P implantation of automatic cardioverter/defibrillator (AICD)   Supplemental oxygen dependent   Hypoalbuminemia due to protein-calorie malnutrition (HCC)     Functional Problem List: Nursing Endurance, Medication Management, Skin Integrity  PT Balance, Behavior, Endurance, Motor, Nutrition, Pain, Safety  OT Balance, Endurance, Motor  SLP    TR         Basic ADL's: OT Bathing, Dressing, Toileting, Grooming     Advanced  ADL's: OT Light Housekeeping     Transfers: PT Bed Mobility, Bed to Chair, Car, Occupational psychologist, Research scientist (life sciences): PT Ambulation, Psychologist, prison and probation services, Stairs     Additional Impairments: OT None  SLP        TR      Anticipated Outcomes Item Anticipated Outcome  Self Feeding I  Swallowing      Basic self-care  S standing at the sink for grooming, S bathing and LB dressing  Toileting  S   Bathroom Transfers S with RW  Bowel/Bladder  Manage bowel with mod I assist (immodium 4x/day p loose stool)  Transfers  mod I  Locomotion  supervision  Communication     Cognition     Pain     Safety/Judgment      Therapy Plan: PT Intensity: Minimum of 1-2 x/day ,45 to 90 minutes PT Frequency: 5 out of 7 days PT Duration Estimated Length of Stay: 10-12 days OT Intensity: Minimum of 1-2 x/day, 45 to 90 minutes OT Frequency: 5 out of 7 days OT  Duration/Estimated Length of Stay: 10-12 days         Team Interventions: Nursing Interventions Disease Management/Prevention, Skin Care/Wound Management, Discharge Planning, Patient/Family Education, Medication Management  PT interventions Ambulation/gait training, Warden/ranger, Community reintegration, Discharge planning, Disease management/prevention, DME/adaptive equipment instruction, Functional mobility training, Neuromuscular re-education, Pain management, Patient/family education, Psychosocial support, Therapeutic Activities, Stair training, UE/LE Strength taining/ROM, UE/LE Coordination activities, Wheelchair propulsion/positioning, Therapeutic Exercise  OT Interventions Warden/ranger, Discharge planning, Functional mobility training, Patient/family education, Self Care/advanced ADL retraining, Therapeutic Activities, UE/LE Strength taining/ROM  SLP Interventions    TR Interventions    SW/CM Interventions Discharge Planning, Psychosocial Support, Patient/Family Education    Team Discharge Planning: Destination: PT-Home ,OT- Home , SLP-  Projected Follow-up: PT-Home health PT, 24 hour supervision/assistance, OT-  Home health OT, SLP-  Projected Equipment Needs: PT-To be determined, OT- None recommended by OT, SLP-  Equipment Details: PT- , OT-  Patient/family involved in discharge planning: PT- Patient,  OT-Patient, Family member/caregiver, SLP-   MD ELOS: 10-14 days. Medical Rehab Prognosis:  Good Assessment:  70 y.o. male with history of CAD, A Fib, ICM s/p AICD, COPD, anxiety disorder, GIB, multiple hospitalizations for HCAP in the past few months --most recent for HCAP requiring discharge to home on oxygen. He was readmitted on 11/28/15 with ongoing hypoxia and progressive diffuse bilateral infiltrates.  Dr. Tyson Alias felt that infiltrates inflammatory in nature ---question vascular v/s  amiodarone toxicity--RA and anti-CCP weakly positive.  Solumedrol  added and amiodarone discontinued. He developed VT which accelerated to  VF with appropriate ICD therapy. His device was reprogrammed and he was started on Mexiletine for VT suppression.  He continued to require high flow oxygen due to persistent hypoxemia and was started on BIPAP at nights. He was placed on broad spectrum antibiotics due to possibility of PNA--antibiotics discontinued 10/28 and PCCM recommends monitoring fornow. Has improved with high dose steroids and Dr. Alvia GroveMannem recommends taper ing by 10 mg every 2 weeks with outpatient f/u.  To continue to wean Fio2 for saturation > 92% for amiodarone toxicity and will need oxymizer to support oxygen needs. Respiratory failure highly suspicious for amiodarone toxicity but will need repeat serologies to rule out auto-immune etiology. He has been weaned down to 5 liters at rest and requires 8-10 liters with activity as desaturates to low 70s but recovers with rest.  No recurrent VT on Mexiletine and Ranolazine and in SR but may have recurrence of A fib as amiodarone washes out per Dr. Ladona Ridgelaylor.  Anxiety has resolved with improvement in activity tolerance. Pt with resulting functional deficits of weakness and severe decompensated activity tolerance.  Will set goals for Mod I/Supervision with PT/OT.  See Team Conference Notes for weekly updates to the plan of care

## 2015-12-06 NOTE — Progress Notes (Signed)
I met with pt and his wife at bedside and discussed with Dr. Eliseo Squires and RN CM. We will make the arrangements to admit pt later today. [phone]

## 2015-12-07 ENCOUNTER — Inpatient Hospital Stay (HOSPITAL_COMMUNITY): Payer: Medicare Other | Admitting: Occupational Therapy

## 2015-12-07 ENCOUNTER — Other Ambulatory Visit: Payer: Self-pay | Admitting: *Deleted

## 2015-12-07 ENCOUNTER — Inpatient Hospital Stay (HOSPITAL_COMMUNITY): Payer: Medicare Other | Admitting: Physical Therapy

## 2015-12-07 DIAGNOSIS — E46 Unspecified protein-calorie malnutrition: Secondary | ICD-10-CM

## 2015-12-07 DIAGNOSIS — E8809 Other disorders of plasma-protein metabolism, not elsewhere classified: Secondary | ICD-10-CM

## 2015-12-07 LAB — COMPREHENSIVE METABOLIC PANEL
ALK PHOS: 81 U/L (ref 38–126)
ALT: 102 U/L — AB (ref 17–63)
AST: 73 U/L — AB (ref 15–41)
Albumin: 1.8 g/dL — ABNORMAL LOW (ref 3.5–5.0)
Anion gap: 4 — ABNORMAL LOW (ref 5–15)
BUN: 21 mg/dL — AB (ref 6–20)
CALCIUM: 7.7 mg/dL — AB (ref 8.9–10.3)
CHLORIDE: 102 mmol/L (ref 101–111)
CO2: 31 mmol/L (ref 22–32)
CREATININE: 0.84 mg/dL (ref 0.61–1.24)
GFR calc non Af Amer: >60 mL/min (ref >60)
GLUCOSE: 78 mg/dL (ref 65–99)
Potassium: 4.3 mmol/L (ref 3.5–5.1)
SODIUM: 137 mmol/L (ref 135–145)
Total Bilirubin: 0.8 mg/dL (ref 0.3–1.2)
Total Protein: 4.4 g/dL — ABNORMAL LOW (ref 6.5–8.1)

## 2015-12-07 LAB — URINALYSIS, ROUTINE W REFLEX MICROSCOPIC
GLUCOSE, UA: NEGATIVE mg/dL
HGB URINE DIPSTICK: NEGATIVE
Ketones, ur: NEGATIVE mg/dL
Nitrite: NEGATIVE
PROTEIN: 30 mg/dL — AB
Specific Gravity, Urine: 1.023 (ref 1.005–1.030)
pH: 6 (ref 5.0–8.0)

## 2015-12-07 LAB — CBC WITH DIFFERENTIAL/PLATELET
Basophils Absolute: 0 10*3/uL (ref 0.0–0.1)
Basophils Relative: 0 %
EOS ABS: 0.1 10*3/uL (ref 0.0–0.7)
EOS PCT: 1 %
HCT: 29.9 % — ABNORMAL LOW (ref 39.0–52.0)
Hemoglobin: 9.4 g/dL — ABNORMAL LOW (ref 13.0–17.0)
LYMPHS ABS: 1.5 10*3/uL (ref 0.7–4.0)
LYMPHS PCT: 11 %
MCH: 27.7 pg (ref 26.0–34.0)
MCHC: 31.4 g/dL (ref 30.0–36.0)
MCV: 88.2 fL (ref 78.0–100.0)
MONO ABS: 1.4 10*3/uL — AB (ref 0.1–1.0)
MONOS PCT: 10 %
Neutro Abs: 10.9 10*3/uL — ABNORMAL HIGH (ref 1.7–7.7)
Neutrophils Relative %: 78 %
PLATELETS: 401 10*3/uL — AB (ref 150–400)
RBC: 3.39 MIL/uL — AB (ref 4.22–5.81)
RDW: 15.5 % (ref 11.5–15.5)
WBC: 13.8 10*3/uL — ABNORMAL HIGH (ref 4.0–10.5)

## 2015-12-07 LAB — URINE MICROSCOPIC-ADD ON: RBC / HPF: NONE SEEN RBC/hpf (ref 0–5)

## 2015-12-07 LAB — MAGNESIUM: Magnesium: 2 mg/dL (ref 1.7–2.4)

## 2015-12-07 MED ORDER — PRO-STAT SUGAR FREE PO LIQD
30.0000 mL | Freq: Two times a day (BID) | ORAL | Status: DC
  Administered 2015-12-07 – 2015-12-16 (×5): 30 mL via ORAL
  Filled 2015-12-07 (×16): qty 30

## 2015-12-07 NOTE — Progress Notes (Signed)
Physical Therapy Note  Patient Details  Name: [name] MRN: [id number] Date of Birth: 1945/09/27 Today's Date: 12/07/2015  Patient in bed on 10 L 02 via [place] with wife present, reporting fatigue and politely declining 30 min skilled physical therapy. Discussed changing plan of care due to decreased activity tolerance and patient in agreement to exactly 3 hours of therapy per day, treatment team and scheduling team notified. Will f/u per POC.    [name] 12/07/2015, 3:35 PM

## 2015-12-07 NOTE — Care Management Note (Signed)
Inpatient Rehabilitation Center Individual Statement of Services  Patient Name:  [name]  Date:  12/07/2015  Welcome to the Inpatient Rehabilitation Center.  Our goal is to provide you with an individualized program based on your diagnosis and situation, designed to meet your specific needs.  With this comprehensive rehabilitation program, you will be expected to participate in at least 3 hours of rehabilitation therapies Monday-Friday, with modified therapy programming on the weekends.  Your rehabilitation program will include the following services:  Physical Therapy (PT), Occupational Therapy (OT), 24 hour per day rehabilitation nursing, Therapeutic Recreaction (TR), Case Management (Social Worker), Rehabilitation Medicine, Nutrition Services and Pharmacy Services  Weekly team conferences will be held on Wednesday to discuss your progress.  Your Social Worker will talk with you frequently to get your input and to update you on team discussions.  Team conferences with you and your family in attendance may also be held.  Expected length of stay: 10-12 days  Overall anticipated outcome: supervision level-over-all  Depending on your progress and recovery, your program may change. Your Social Worker will coordinate services and will keep you informed of any changes. Your Social Worker's name and contact numbers are listed  below.  The following services may also be recommended but are not provided by the Inpatient Rehabilitation Center:   Driving Evaluations  Home Health Rehabiltiation Services  Outpatient Rehabilitation Services    Arrangements will be made to provide these services after discharge if needed.  Arrangements include referral to agencies that provide these services.  Your insurance has been verified to be:  Medicare & [employer] BCBS Your primary doctor is:  Crawford Givens  Pertinent information will be shared with your doctor and your insurance company.  Social Worker:   Dossie Der, [phone] or [phone]  Information discussed with and copy given to patient by: Lucy Chris, 12/07/2015, 9:33 AM

## 2015-12-07 NOTE — Patient Outreach (Signed)
[facility] Customer service manager [facility]) Care Management [facility] Community CM, Care Coordination Outreach 12/07/2015  [name] 15-Aug-1945 [id number]  Secure communication through EMR with [name], [facility] RN [facility] Liaison, regarding [name], 70 y/o male, referred to [facility] Community CM for transition of care after recent hospitalizations October 2-4, 2017 and then again October 13-17, 2017 for HCAP, hypoxia, and AKI secondary to dehydration. Patient was treated with antibiotics, IV fluids, and supportive care.  Patient was re-admitted for third hospitalization on October 24- December 06, 2015 for acute on chronic respiratory failure with hypoxia after having syncopal episode at home when using HHN.  Patient noted to have history including but not limited to, COPD, sCHF, CAD with CABG, V-fib with AICD placement, A-Fib, bradycardia, and GERD.    Notified [facility] RN [facility] Liaisons through EMR messaging of patient transfer to IP Rehabilitation Unit on 12/06/2015.    Plan:  Will close current [facility] CM case, as patient has been hospitalized for > 10 days   [facility] Liaison RN CM/ Community RN CM will follow and collaborate during current IP Rehabilitation admission for transition of care to resume upon patient discharge.  Caryl Pina, RN, BSN, CCRN [facility] Coordinator [facility] Care Management  [phone]

## 2015-12-07 NOTE — Progress Notes (Signed)
[facility] PHYSICAL [facility]ICINE & REHABILITATION     PROGRESS NOTE  Subjective/Complaints:  Pt sitting up in bed this AM.  He states didn't sleep well overnight because of a cough, but that has since resolved and he is ready to begin therapies.   ROS: +DOE. Denies CP, N/V/D.  Objective: Vital Signs: Blood pressure (!) 109/57, pulse 60, temperature 98.5 F (36.9 C), temperature source Oral, resp. rate 18, height 5\' 3"  (1.6 m), weight 63.6 kg (140 lb 3.4 oz), SpO2 92 %. No results found.  Recent Labs  12/06/15 0326 12/07/15 0500  WBC 12.1* 13.8*  HGB 9.2* 9.4*  HCT 29.3* 29.9*  PLT 430* 401*    Recent Labs  12/06/15 0326 12/07/15 0500  NA 138 137  K 4.1 4.3  CL 104 102  GLUCOSE 98 78  BUN 26* 21*  CREATININE 0.86 0.84  CALCIUM 7.5* 7.7*   CBG (last 3)  No results for input(s): GLUCAP in the last 72 hours.  Wt Readings from Last 3 Encounters:  12/07/15 63.6 kg (140 lb 3.4 oz)  12/06/15 61.7 kg (136 lb)  11/24/15 63.8 kg (140 lb 12 oz)    Physical Exam:  BP (!) 109/57 (BP Location: Left Arm)   Pulse 60   Temp 98.5 F (36.9 C) (Oral)   Resp 18   Ht 5\' 3"  (1.6 m)   Wt 63.6 kg (140 lb 3.4 oz)   SpO2 92%   BMI 24.84 kg/m  Constitutional: He appears cachectic. NAD. HENT: Normocephalicand atraumatic.  Eyes: EOMI. No discharge.  Cardiovascular: Normal rateand regular rhythm. No JVD. Respiratory: +[place]. Effort normaland breath sounds normal. No stridor. No respiratory distress. He has no wheezes.  GI: Soft. Bowel sounds are normal. He exhibits no distension.  Musculoskeletal: He exhibits no edemaor tenderness.  Neurological: He is alertand oriented.  Motor: 4-4+/5 throughout Skin: Skin is warmand dry.  Not diaphoretic.  Assessment/Plan: 1. Functional deficits secondary to debility from respiratory distress which require 3+ hours per day of interdisciplinary therapy in a comprehensive inpatient rehab setting. Physiatrist is providing close team supervision  and 24 hour management of active medical problems listed below. Physiatrist and rehab team continue to assess barriers to discharge/monitor patient progress toward functional and medical goals.  Function:  Bathing Bathing position      Bathing parts      Bathing assist        Upper Body Dressing/Undressing Upper body dressing                    Upper body assist        Lower Body Dressing/Undressing Lower body dressing                                  Lower body assist        Toileting Toileting          Toileting assist     Transfers Chair/bed transfer   Chair/bed transfer method: Ambulatory, Stand pivot Chair/bed transfer assist level: Touching or steadying assistance (Pt > 75%) Chair/bed transfer assistive device: Armrests     Locomotion Ambulation     Max distance: 80 Assist level: Touching or steadying assistance (Pt > 75%)   Wheelchair          Cognition Comprehension Comprehension assist level: Follows complex conversation/direction with no assist  Expression Expression assist level: Expresses complex ideas: With no assist  Social Interaction  Social Interaction assist level: Interacts appropriately with others - No medications needed.  Problem Solving Problem solving assist level: Solves complex problems: With extra time  Memory Memory assist level: Complete Independence: No helper     Medical Problem List and Plan: 1.  Weakness and poor activity tolerance secondary to debility from respiratory distress.  Begin CIR 2.  DVT Prophylaxis/Anticoagulation: Pharmaceutical: Other (comment)--Eliquis. H/o GIB in the past. Monitor for H/H.  3. Pain Management: Ultram prn. Local measures with heat/ice prn 4. Mood: Anxiety resolved. LCSW to follow for evaluation and support.  5. Neuropsych: This patient is capable of making decisions on his own behalf. 6. Skin/Wound Care: Maintain adequate nutritional and hydration status. Routine  pressure relief measures 7. Fluids/Electrolytes/Nutrition: Monitor I/O.  8. Amiodarone toxicity with respiratory failure: Needs to maintain saturation over 92%--on prednisone 60 mg daily since 10/30.             Taper by 10 mg every 2 weeks with outpatient f/u.  9. VT/VF s/p AICD: On Mexitil and Ranexa 10. CAD with chronic systolic CHF with severe LVD: Cath 09/28/15 showed 2 vessel obstructive disease--unchanged from 2004. Unable to tolerate statins -- On Ranexa, Coreg and Cozaar. Lasix on hold. 11.  Leucocytosis:   Likely reactive, afebrile  WBCs 13.8 on 11/2  Cont to monitor  UA/Ucx ordered  CXR reviewed 10/31, ?PNA vs edema - cont to monitor  12  Anemia:   Hb 9.4 on 11/2  Cont to monitor 13. Diarrhea: ?Improving  Cont to monitor  Check c diff if it continues.    14. H/o AFib: In NSR. On eliquis --monitor off amiodarone. On Mexitil every 8 hours.  15. Hypoalbuminemia  Supplement started 11/2  LOS (Days) 1 A FACE TO FACE EVALUATION WAS PERFORMED  Bekah Igoe Karis Jubanil Abyan Cadman 12/07/2015 9:33 AM

## 2015-12-07 NOTE — Progress Notes (Signed)
Rt called to pt's room due to desat mid 80's on [place] HFNC 6 Lpm after OT. RN called for PRN Xopenex treatment, increased HFNC to 9 L, sat recovered to 95%.  Pt feeling better, family present, RN aware.

## 2015-12-07 NOTE — Evaluation (Signed)
Physical Therapy Assessment and Plan  Patient Details  Name: [name] MRN: [id number] Date of Birth: 21-Dec-1945  PT Diagnosis: Difficulty walking and Muscle weakness Rehab Potential: Good ELOS: 10-12 days   Today's Date: 12/07/2015 PT Individual Time: 0800-0900  PT Individual Time Calculation (min): 60 min     Problem List: Patient Active Problem List   Diagnosis Date Noted  . Physical debility 12/06/2015  . Amiodarone toxicity   . Anemia of chronic disease   . Anxiety about health   . Chronic systolic CHF (congestive heart failure) ([place])   . Coronary artery disease involving native coronary artery of native heart without angina pectoris   . Diarrhea   . History of GI bleed   . History of ventricular fibrillation   . Lymphocytosis   . PAF (paroxysmal atrial fibrillation) ([place])   . Respiratory distress   . S/P implantation of automatic cardioverter/defibrillator (AICD)   . Supplemental oxygen dependent   . Acute hypoxemic respiratory failure ([place])   . Malnutrition of moderate degree 11/29/2015  . Opacity of lung on imaging study   . Physical deconditioning 11/28/2015  . Transaminitis 11/28/2015  . Anemia 11/28/2015  . Elevated troponin   . Hypoxia   . AICD (automatic cardioverter/defibrillator) present 11/08/2015  . HCAP (healthcare-associated pneumonia) 11/07/2015  . Acute on chronic respiratory failure with hypoxia ([place]) 11/07/2015  . Fall at home 10/11/2015  . VT (ventricular tachycardia) ([place]) 09/27/2015  . Memory loss 09/24/2015  . Encounter for screening examination for infectious disease 09/24/2015  . Chronic atrial fibrillation ([place]) 05/28/2015  . History of ventricular tachycardia 05/25/2015  . Syncope 05/12/2015  . Bradycardia 05/12/2015  . Acute non-recurrent maxillary sinusitis 03/23/2015  . Advance care planning 09/16/2014  . Muscle ache 09/16/2014  . Back pain 09/08/2013  . Abdominal pain, chronic, epigastric 09/08/2013  . Fatigue 06/11/2013   . Chest wall mass 11/05/2012  . Arthritis   . Insomnia   . Anxiety   . Diverticulosis 12/11/2011  . Tobacco abuse 03/06/2011  . Ischemic cardiomyopathy 02/12/2011  . Acute on chronic systolic CHF (congestive heart failure) ([place]) 05/10/2010  . Coronary atherosclerosis 10/31/2008  . Hyperlipidemia 08/25/2008  . ALLERGIC RHINITIS 08/25/2008  . Amiodarone pulmonary toxicity 08/25/2008    Past Medical History:  Past Medical History:  Diagnosis Date  . Acute lower GI bleeding 12/11/2011   "first time" (12/11/2011)  . Acute myocardial infarction, unspecified site, episode of care unspecified 1995   Pt living in [place], no stent, ?PTCA  . AICD (automatic cardioverter/defibrillator) present   . Allergic rhinitis, cause unspecified   . Anxiety   . Arthritis    "all over" (11/07/2015)  . Atrial fibrillation ([place])    a. 05/2015 - converted to sinus in setting of ICD shocks; placed on eliquis 5 bid.  [name] CAD (coronary artery disease), autologous vein bypass graft   . Cervical herniated disc    told not to lift >10 lbs  . Chronic systolic CHF (congestive heart failure), NYHA class 2 (HCC)    Reports EF of 25%.   [name] COPD (chronic obstructive pulmonary disease) ([place]) 11/2012   by xray  . Diverticulosis    by CT scan  . HCAP (healthcare-associated pneumonia) 11/06/2015  . Hypertension   . Insomnia   . Paroxysmal ventricular tachycardia ([place])   . Perennial allergic rhinitis    only to dust mites  . Pneumonia 2000s   "walking pneumonia"  . VT (ventricular tachycardia) ([place])    a. 05/2015 -  VT storm with multiple ICD shocks-->Amio 400 BID.   Past Surgical History:  Past Surgical History:  Procedure Laterality Date  . CARDIAC CATHETERIZATION  2004   LAD 30%, D1 30%, CFX-AV groove 70-80%, OM1 30%, EF 20-25%  . CARDIAC CATHETERIZATION N/A 09/28/2015   Procedure: Left Heart Cath and Coronary Angiography;  Surgeon: Peter M Martinique, MD;  Location: [facility];  Service: Cardiovascular;   Laterality: N/A;  . CARDIAC DEFIBRILLATOR PLACEMENT  2004  . CATARACT EXTRACTION W/ INTRAOCULAR LENS IMPLANT Right 01/2012  . COLONOSCOPY  01/08/2012   Procedure: COLONOSCOPY;  Surgeon: Gatha Mayer, MD;  Location: [facility];  Service: Endoscopy;  Laterality: N/A;  . CORONARY ANGIOPLASTY  1995   Pt thinks he got a balloon, living in [place], [place] N/A 02/07/2012   Procedure: IMPLANTABLE CARDIOVERTER DEFIBRILLATOR GENERATOR CHANGE;  Surgeon: Evans Lance, MD; Medtronic Evera XT VR single-chamber serial number [id number], Laterality: Left  . INSERT / REPLACE / REMOVE PACEMAKER  2004   Medtronic ICD  . KNEE ARTHROSCOPY Left 05/2003   Archie Endo 06/19/2010  . LAPAROSCOPIC CHOLECYSTECTOMY  1/ 2012  . SHOULDER ARTHROSCOPY W/ ROTATOR CUFF REPAIR Right twice  . TONSILLECTOMY AND ADENOIDECTOMY  ~ 1951    Assessment & Plan Clinical Impression: Randy Stencil Bosleyis a 70 y.o.malewith history of CAD, A Fib, ICM s/p AICD, COPD, anxiety disorder, GIB, multiple hospitalizations for HCAP in the past few months --most recent for HCAP requiring discharge to home on oxygen. He was readmitted on 11/28/15 with ongoing hypoxia and progressive diffuse bilateral infiltrates. Dr. Titus Mould felt that infiltrates inflammatory in nature ---question vascular v/s amiodarone toxicity--RA and anti-CCP weakly positive.  Solumedrol added and amiodarone discontinued. He developed VT which accelerated to VF with appropriate ICD therapy. His device was reprogrammed and he was started on Mexiletine for VT suppression. He continued to require high flow oxygen due to persistent hypoxemia and was started on BIPAP at nights. He was placed on broad spectrum antibiotics due to possibility of PNA--antibiotics discontinued 10/28 and PCCM recommends monitoring for now. Has improved with high dose steroids and Dr. Wonda Amis recommends tapering by 10 mg every 2 weeks with outpatient f/u.   To continue to wean Fio2 for saturation > 92% for amiodarone toxicity and will need oxymizer to support oxygen needs. Respiratory failure highly suspicious for amiodarone toxicity but will need repeat serologies to rule out auto-immune etiology. He has been weaned down to 5 liters at rest and requires 8-10 liters with activity as desaturates to low 70s but recovers with rest.  No recurrent VT on Mexiletine and Ranolazine and in SR but may have recurrence of A fib as amiodarone washes out per Dr. Lovena Le.  Anxiety has resolved with improvement in activity tolerance.  Patient transferred to CIR on 12/06/2015.   Patient currently requires min with mobility secondary to muscle weakness and muscle joint tightness, decreased cardiorespiratoy endurance and decreased oxygen support, ataxia and decreased standing balance and decreased balance strategies.  Prior to hospitalization, patient was independent  with mobility and lived with Spouse in a House home.  Home access is 2 steps into back doorStairs to enter.  Patient will benefit from skilled PT intervention to maximize safe functional mobility, minimize fall risk and decrease caregiver burden for planned discharge home with 24 hour supervision.  Anticipate patient will benefit from follow up [place] at discharge.  PT - End of Session Activity Tolerance: Tolerates < 10 min activity with changes  in vital signs Endurance Deficit: Yes Endurance Deficit Description: patient desaturates to 76% on 10 L 02 via [place] with mobility PT Assessment Rehab Potential (ACUTE/IP ONLY): Good Barriers to Discharge: Inaccessible home environment Barriers to Discharge Comments: bedroom on 2nd floor PT Patient demonstrates impairments in the following area(s): Balance;Behavior;Endurance;Motor;Nutrition;Pain;Safety PT Transfers Functional Problem(s): Bed Mobility;Bed to Chair;Car;Furniture PT Locomotion Functional Problem(s): Ambulation;Wheelchair Mobility;Stairs PT Plan PT Intensity:  Minimum of 1-2 x/day ,45 to 90 minutes PT Frequency: 5 out of 7 days PT Duration Estimated Length of Stay: 10-12 days PT Treatment/Interventions: Ambulation/gait training;Balance/vestibular training;Community reintegration;Discharge planning;Disease management/prevention;DME/adaptive equipment instruction;Functional mobility training;Neuromuscular re-education;Pain management;Patient/family education;Psychosocial support;Therapeutic Activities;Stair training;UE/LE Strength taining/ROM;UE/LE Coordination activities;Wheelchair propulsion/positioning;Therapeutic Exercise PT Transfers Anticipated Outcome(s): mod I PT Locomotion Anticipated Outcome(s): supervision PT Recommendation Follow Up Recommendations: Home health PT;24 hour supervision/assistance Patient destination: Home Equipment Recommended: To be determined  Skilled Therapeutic Intervention Skilled therapeutic intervention initiated after completion of evaluation. Discussed with patient falls risk, safety within room, and focus of therapy during stay. Discussed possible length of stay, goals, and follow-up therapy. Patient initially on 5 L 02 via [place] at rest but increased to 10 L 02 via [place] for remainder of session with patient consistently desaturating to mid to low 70's with functional mobility requiring increased time and rest to recover back to low 90's. Patient required min A overall for mobility using RW and cues for safety especially decreasing pace of movement. Switched wheelchair to 16 x 16 wheelchair with Roho cushion for improved sitting tolerance and upright posture. Patient requested to return to bed at end of session, left semi reclined with needs in reach on 7 L 02 via [place].   PT Evaluation Precautions/Restrictions Precautions Precautions: Fall Precaution Comments: high flow O2 Restrictions Weight Bearing Restrictions: No General Chart Reviewed: Yes Family/Caregiver Present: No Pain Pain Assessment Pain Assessment:  No/denies pain Pain Score: 0-No pain Pain Type: Acute pain Pain Location: Buttocks Pain Descriptors / Indicators: Aching Pain Onset: On-going Pain Intervention(s): Repositioned Home Living/Prior Functioning Home Living Living Arrangements: Spouse/significant other Available Help at Discharge: Family;Available 24 hours/day Type of Home: House Home Access: Stairs to enter [employer] of Steps: 2 steps into back door Entrance Stairs-Rails: None Home Layout: Two level;1/2 bath on main level;Bed/bath upstairs Alternate Level Stairs-Number of Steps: 13 Alternate Level Stairs-Rails: Right;Left;Can reach both (2 rails until halfway switches to L rail only) Bathroom Shower/Tub: Multimedia programmer: Standard Bathroom Accessibility: Yes  Lives With: Spouse Prior Function Level of Independence: Independent with transfers;Independent with gait;Independent with basic ADLs  Able to Take Stairs?: Yes Driving: Yes Vocation: Retired Biomedical scientist: drove [employer] Leisure: Hobbies-yes (Comment) Comments: outdoorsman-bass fishing, target practice Vision/Perception  Perception Comments: WFL  No change from baseline  Cognition Arousal/Alertness: Awake/alert Orientation Level: Oriented X4 Memory: Appears intact Safety/Judgment: Appears intact Sensation Sensation Light Touch: Appears Intact Stereognosis: Appears Intact Hot/Cold: Appears Intact Proprioception: Appears Intact Coordination Gross Motor Movements are Fluid and Coordinated: No Fine Motor Movements are Fluid and Coordinated: Yes Coordination and Movement Description: generalized weakness and deconditioning Motor  Motor Motor: Abnormal postural alignment and control  Mobility Bed Mobility Bed Mobility: Supine to Sit;Sit to Supine Supine to Sit: 5: Supervision;With rails Sit to Supine: 5: Supervision;With rail Transfers Transfers: Yes Stand Pivot Transfers: 4: Min assist;With  armrests Locomotion  Ambulation Ambulation: Yes Ambulation/Gait Assistance: 4: Min assist Ambulation Distance (Feet): 80 Feet Assistive device: Rolling walker Gait Gait: Yes Gait Pattern: Impaired Gait Pattern: Step-through pattern;Lateral trunk lean to right;Lateral trunk lean to left;Lateral  hip instability Gait velocity: WFL due to fast pace, requires cues to slow down for safety and energy conservation Stairs / Additional Locomotion Stairs: Yes Stairs Assistance: 4: Min assist Stair Management Technique: Step to pattern;Two rails;Forwards Number of Stairs: 8 Height of Stairs: 6 Wheelchair Mobility Wheelchair Mobility: No  Trunk/Postural Assessment  Cervical Assessment Cervical Assessment: Within Functional Limits Thoracic Assessment Thoracic Assessment: Exceptions to [facility] (kyphotic in static sitting, with cues can hold upright posture) Lumbar Assessment Lumbar Assessment: Within Functional Limits Postural Control Postural Control: Within Functional Limits  Balance Static Standing Balance Static Standing - Level of Assistance: 4: Min assist Dynamic Standing Balance Dynamic Standing - Level of Assistance: 3: Mod assist Extremity Assessment  RUE Assessment RUE Assessment: Within Functional Limits LUE Assessment LUE Assessment: Within Functional Limits RLE Assessment RLE Assessment: Within Functional Limits RLE Strength RLE Overall Strength: Within Functional Limits for tasks assessed RLE Overall Strength Comments: grossly 5/5 throughout except 4-/5 hip flexion LLE Assessment LLE Assessment: Within Functional Limits LLE Strength LLE Overall Strength: Within Functional Limits for tasks assessed LLE Overall Strength Comments: grossly 5/5 throughout except 4-/5 hip flexion   See Function Navigator for Current Functional Status.   Refer to Care Plan for Long Term Goals  Recommendations for other services: None  Discharge Criteria: Patient will be discharged from  PT if patient refuses treatment 3 consecutive times without medical reason, if treatment goals not met, if there is a change in medical status, if patient makes no progress towards goals or if patient is discharged from hospital.  The above assessment, treatment plan, treatment alternatives and goals were discussed and mutually agreed upon: by patient  Laretta Alstrom 12/07/2015, 10:17 AM

## 2015-12-07 NOTE — Progress Notes (Signed)
Occupational Therapy Session Note  Patient Details  Name: [name] MRN: [id number] Date of Birth: 1945-08-13  Today's Date: 12/07/2015 OT Individual Time: 1345-1430 OT Individual Time Calculation (min): 45 min     Short Term Goals: Week 1:  OT Short Term Goal 1 (Week 1): Pt will complete shower transfers with close S. OT Short Term Goal 2 (Week 1): Pt will be able to tolerate standing for 2 minutes with O2 sats >90. OT Short Term Goal 3 (Week 1): Pt will don pants with close S.  Skilled Therapeutic Interventions/Progress Updates: Customer service manager;Functional mobility training;Patient/family education;Self Care/advanced ADL retraining;Therapeutic Activities;UE/LE Strength taining/ROM   1:1 Therapeutic activity with focus on increasing activity tolerance on 10 liters of O2. Orders changed to not try to wean from 10 liters at this point. Able to ambulate ~50-100 feet with RW with steadying A and cues for slowing down at a time before resting in a chair. O2 was 88-90 after activity but then the longer we would sit the lower he would desat before returning to 90. PA and RN made aware. Pt ambulated 3-4 times and perform 5 min on [place] on resistance 7. Returned to bed at end of session.   Therapy Documentation Precautions:  Precautions Precautions: Fall Precaution Comments: high flow O2 Restrictions Weight Bearing Restrictions: No Pain: Pain Assessment Pain Assessment: No/denies pain  See Function Navigator for Current Functional Status.   Therapy/Group: Individual Therapy  Roney Mans [facility] 12/07/2015, 3:59 PM

## 2015-12-07 NOTE — Progress Notes (Signed)
Social Work Assessment and Plan Social Work Assessment and Plan  Patient Details  Name: [name] L Duross MRN: [id number] Date of Birth: 11-29-1945  Today's Date: 12/07/2015  Problem List:  Patient Active Problem List   Diagnosis Date Noted  . Physical debility 12/06/2015  . Amiodarone toxicity   . Anemia of chronic disease   . Anxiety about health   . Chronic systolic CHF (congestive heart failure) (HCC)   . Coronary artery disease involving native coronary artery of native heart without angina pectoris   . Diarrhea   . History of GI bleed   . History of ventricular fibrillation   . Lymphocytosis   . PAF (paroxysmal atrial fibrillation) (HCC)   . Respiratory distress   . S/P implantation of automatic cardioverter/defibrillator (AICD)   . Supplemental oxygen dependent   . Acute hypoxemic respiratory failure (HCC)   . Malnutrition of moderate degree 11/29/2015  . Opacity of lung on imaging study   . Physical deconditioning 11/28/2015  . Transaminitis 11/28/2015  . Anemia 11/28/2015  . Elevated troponin   . Hypoxia   . AICD (automatic cardioverter/defibrillator) present 11/08/2015  . HCAP (healthcare-associated pneumonia) 11/07/2015  . Acute on chronic respiratory failure with hypoxia (HCC) 11/07/2015  . Fall at home 10/11/2015  . VT (ventricular tachycardia) (HCC) 09/27/2015  . Memory loss 09/24/2015  . Encounter for screening examination for infectious disease 09/24/2015  . Chronic atrial fibrillation (HCC) 05/28/2015  . History of ventricular tachycardia 05/25/2015  . Syncope 05/12/2015  . Bradycardia 05/12/2015  . Acute non-recurrent maxillary sinusitis 03/23/2015  . Advance care planning 09/16/2014  . Muscle ache 09/16/2014  . Back pain 09/08/2013  . Abdominal pain, chronic, epigastric 09/08/2013  . Fatigue 06/11/2013  . Chest wall mass 11/05/2012  . Arthritis   . Insomnia   . Anxiety   . Diverticulosis 12/11/2011  . Tobacco abuse 03/06/2011  . Ischemic  cardiomyopathy 02/12/2011  . Acute on chronic systolic CHF (congestive heart failure) (HCC) 05/10/2010  . Coronary atherosclerosis 10/31/2008  . Hyperlipidemia 08/25/2008  . ALLERGIC RHINITIS 08/25/2008  . Amiodarone pulmonary toxicity 08/25/2008   Past Medical History:  Past Medical History:  Diagnosis Date  . Acute lower GI bleeding 12/11/2011   "first time" (12/11/2011)  . Acute myocardial infarction, unspecified site, episode of care unspecified 1995   [place], [place], ?PTCA  . AICD (automatic cardioverter/defibrillator) present   . Allergic rhinitis, cause unspecified   . Anxiety   . Arthritis    "all over" (11/07/2015)  . Atrial fibrillation (HCC)    a. 05/2015 - converted to sinus in setting of ICD shocks; placed on eliquis 5 bid.  [name]. CAD (coronary artery disease), autologous vein bypass graft   . Cervical herniated disc    told not to lift >10 lbs  . Chronic systolic CHF (congestive heart failure), NYHA class 2 (HCC)    Reports EF of 25%.   [name]. COPD (chronic obstructive pulmonary disease) (HCC) 11/2012   by xray  . Diverticulosis    by CT scan  . HCAP (healthcare-associated pneumonia) 11/06/2015  . Hypertension   . Insomnia   . Paroxysmal ventricular tachycardia (HCC)   . Perennial allergic rhinitis    only to dust mites  . Pneumonia 2000s   "walking pneumonia"  . VT (ventricular tachycardia) (HCC)    a. 05/2015 - VT storm with multiple ICD shocks-->Amio 400 BID.   Past Surgical History:  Past Surgical History:  Procedure Laterality Date  . CARDIAC CATHETERIZATION  2004   LAD 30%, D1 30%, CFX-AV groove 70-80%, OM1 30%, EF 20-25%  . CARDIAC CATHETERIZATION N/A 09/28/2015   Procedure: Left Heart Cath and Coronary Angiography;  Surgeon: Peter M Swaziland, MD;  [facility] INVASIVE CV LAB;  Service: Cardiovascular;  Laterality: N/A;  . CARDIAC DEFIBRILLATOR PLACEMENT  2004  . CATARACT EXTRACTION W/ INTRAOCULAR LENS IMPLANT Right 01/2012  . COLONOSCOPY   01/08/2012   Procedure: COLONOSCOPY;  Surgeon: Iva Boop, MD;  Location: [facility] ENDOSCOPY;  Service: Endoscopy;  Laterality: N/A;  . CORONARY ANGIOPLASTY  1995   Pt thinks he got a balloon, living in [place], [place]  . IMPLANTABLE CARDIOVERTER DEFIBRILLATOR GENERATOR CHANGE N/A 02/07/2012   Procedure: IMPLANTABLE CARDIOVERTER DEFIBRILLATOR GENERATOR CHANGE;  Surgeon: Marinus Maw, MD; Medtronic Evera XT VR single-chamber serial number [id number], Laterality: Left  . INSERT / REPLACE / REMOVE PACEMAKER  2004   Medtronic ICD  . KNEE ARTHROSCOPY Left 05/2003   Hattie Perch 06/19/2010  . LAPAROSCOPIC CHOLECYSTECTOMY  1/ 2012  . SHOULDER ARTHROSCOPY W/ ROTATOR CUFF REPAIR Right twice  . TONSILLECTOMY AND ADENOIDECTOMY  ~ 64   Social History:  reports that he quit smoking about 2 months ago. His smoking use included Cigarettes and Cigars. He has a 25.00 pack-year smoking history. He has never used smokeless tobacco. He reports that he does not drink alcohol or use drugs.  Family / Support Systems Marital Status: Married Patient Roles: Spouse, Parent, Volunteer Spouse/Significant Other: Marie  [phone] Children: Two children who are local and involved Other Supports: Friends and church members Anticipated Caregiver: wife Ability/Limitations of Caregiver: wife has some health issues but can provide supervision level Caregiver Availability: 24/7 Family Dynamics: Close with children, grandchildren and great grandchildren. They have good socialsupports via family, friends, and church members.   Social History Preferred language: English Religion: Protestant Cultural Background: No issues Education: High School Read: Yes Write: Yes Employment Status: Retired Fish farm manager Issues: No issues Guardian/Conservator: None-according to MD pt is capable of making his own decisions while here   Abuse/Neglect Physical Abuse: Denies Verbal Abuse: Denies Sexual Abuse: Denies Exploitation  of patient/patient's resources: Denies Self-Neglect: Denies  Emotional Status Pt's affect, behavior adn adjustment status: Pt is motivated and feels his main issue is his breathing and muscle weakness. He needs to be able to get his thighs back in shape, so he will be able to be independent. He does not want to burden his wife, she has enough to do for him. Recent Psychosocial Issues: other health with multiple hospital admissions the past few months. Pyschiatric History: No history deferred depression screen at this time due to appears to be coping apporpriately. Will monitor and get input from team regarding if neuro-psych needed. Substance Abuse History: Tobacco-quit 2 months ago  Patient / Family Perceptions, Expectations & Goals Pt/Family understanding of illness & functional limitations: Pt is able to explain his multiple medical issues. He does talk with the MD daily and feels his questions and concerns are being addressed. He wants to do well and get home and stay home this time. Premorbid pt/family roles/activities: husband, father, Wellsite geologist, retiree, home owner, church member, etc Anticipated changes in roles/activities/participation: resume Pt/family expectations/goals: Pt states: " I want to get stronger and be able to do for myself."  Wife states: " I hope he gets stronger and medically stable, we have been in the hospital a lot in the past month."  [employer]: Other (Comment) ([employer] involved) Premorbid Home Care/DME Agencies: Other (  Comment) [facility] care active pt) Transportation available at discharge: Wife Resource referrals recommended: Support group (specify)  Discharge Planning Living Arrangements: Spouse/significant other Support Systems: Spouse/significant other, Children, Other relatives, Friends/neighbors, Psychologist, clinical community Type of Residence: Private residence Insurance Resources: [employer], Media planner (specify) Teacher, adult education) Surveyor, quantity Resources: Tree surgeon, Family Support Financial Screen Referred: No Living Expenses: Own Money Management: Patient, Spouse Does the patient have any problems obtaining your medications?: No Home Management: wife does this Patient/Family Preliminary Plans: Return home with wife who can provide assistance if needed. She has been assisting him prior to this hospital admission. Will await team evaluation's and come up with a safe discharge plan. Already has HH arranged. Social Work Anticipated Follow Up Needs: HH/OP, Support Group  Clinical Impression Pleasant gentleman who is deconditioned due to multiple medical issues and many hospital admissions here in the past month. His wife is involved and supportive and willing to assist him at home. She plans to be here  And participate in his therapies. Will await team's evaluations and work on a discharge plan.  Lucy Chris 12/07/2015, 9:51 AM

## 2015-12-07 NOTE — Evaluation (Signed)
Occupational Therapy Assessment and Plan  Patient Details  Name: [name] MRN: [id number] Date of Birth: 02-06-1945  OT Diagnosis: muscle weakness (generalized) Rehab Potential: Rehab Potential (ACUTE ONLY): Good ELOS: 10-12 days   Today's Date: 12/07/2015 OT Individual Time: 1100-1200 OT Individual Time Calculation (min): 60 min      Problem List:  Patient Active Problem List   Diagnosis Date Noted  . Hypoalbuminemia due to protein-calorie malnutrition ([place])   . Physical debility 12/06/2015  . Amiodarone toxicity   . Anemia of chronic disease   . Anxiety about health   . Chronic systolic CHF (congestive heart failure) ([place])   . Coronary artery disease involving native coronary artery of native heart without angina pectoris   . Diarrhea   . History of GI bleed   . History of ventricular fibrillation   . Lymphocytosis   . PAF (paroxysmal atrial fibrillation) ([place])   . Respiratory distress   . S/P implantation of automatic cardioverter/defibrillator (AICD)   . Supplemental oxygen dependent   . Acute hypoxemic respiratory failure ([place])   . Malnutrition of moderate degree 11/29/2015  . Opacity of lung on imaging study   . Physical deconditioning 11/28/2015  . Transaminitis 11/28/2015  . Anemia 11/28/2015  . Elevated troponin   . Hypoxia   . AICD (automatic cardioverter/defibrillator) present 11/08/2015  . HCAP (healthcare-associated pneumonia) 11/07/2015  . Acute on chronic respiratory failure with hypoxia ([place]) 11/07/2015  . Fall at home 10/11/2015  . VT (ventricular tachycardia) ([place]) 09/27/2015  . Memory loss 09/24/2015  . Encounter for screening examination for infectious disease 09/24/2015  . Chronic atrial fibrillation ([place]) 05/28/2015  . History of ventricular tachycardia 05/25/2015  . Syncope 05/12/2015  . Bradycardia 05/12/2015  . Acute non-recurrent maxillary sinusitis 03/23/2015  . Advance care planning 09/16/2014  . Muscle ache 09/16/2014  . Back  pain 09/08/2013  . Abdominal pain, chronic, epigastric 09/08/2013  . Fatigue 06/11/2013  . Chest wall mass 11/05/2012  . Arthritis   . Insomnia   . Anxiety   . Diverticulosis 12/11/2011  . Tobacco abuse 03/06/2011  . Ischemic cardiomyopathy 02/12/2011  . Acute on chronic systolic CHF (congestive heart failure) ([place]) 05/10/2010  . Coronary atherosclerosis 10/31/2008  . Hyperlipidemia 08/25/2008  . ALLERGIC RHINITIS 08/25/2008  . Amiodarone pulmonary toxicity 08/25/2008    Past Medical History:  Past Medical History:  Diagnosis Date  . Acute lower GI bleeding 12/11/2011   "first time" (12/11/2011)  . Acute myocardial infarction, unspecified site, episode of care unspecified 1995   Pt living in [place], no stent, ?PTCA  . AICD (automatic cardioverter/defibrillator) present   . Allergic rhinitis, cause unspecified   . Anxiety   . Arthritis    "all over" (11/07/2015)  . Atrial fibrillation ([place])    a. 05/2015 - converted to sinus in setting of ICD shocks; placed on eliquis 5 bid.  [name] CAD (coronary artery disease), autologous vein bypass graft   . Cervical herniated disc    told not to lift >10 lbs  . Chronic systolic CHF (congestive heart failure), NYHA class 2 (HCC)    Reports EF of 25%.   [name] COPD (chronic obstructive pulmonary disease) ([place]) 11/2012   by xray  . Diverticulosis    by CT scan  . HCAP (healthcare-associated pneumonia) 11/06/2015  . Hypertension   . Insomnia   . Paroxysmal ventricular tachycardia ([place])   . Perennial allergic rhinitis    only to dust mites  . Pneumonia 2000s   "walking pneumonia"  .  VT (ventricular tachycardia) ([place])    a. 05/2015 - VT storm with multiple ICD shocks-->Amio 400 BID.   Past Surgical History:  Past Surgical History:  Procedure Laterality Date  . CARDIAC CATHETERIZATION  2004   LAD 30%, D1 30%, CFX-AV groove 70-80%, OM1 30%, EF 20-25%  . CARDIAC CATHETERIZATION N/A 09/28/2015   Procedure: Left Heart Cath and Coronary Angiography;   Surgeon: Peter M Martinique, MD;  Location: [place] CV LAB;  Service: Cardiovascular;  Laterality: N/A;  . CARDIAC DEFIBRILLATOR PLACEMENT  2004  . CATARACT EXTRACTION W/ INTRAOCULAR LENS IMPLANT Right 01/2012  . COLONOSCOPY  01/08/2012   Procedure: COLONOSCOPY;  Surgeon: Gatha Mayer, MD;  Location: [facility] ENDOSCOPY;  Service: Endoscopy;  Laterality: N/A;  . CORONARY ANGIOPLASTY  1995   Pt thinks he got a balloon, living in [place], [place] N/A 02/07/2012   Procedure: IMPLANTABLE CARDIOVERTER DEFIBRILLATOR GENERATOR CHANGE;  Surgeon: Evans Lance, MD; Medtronic Evera XT VR single-chamber serial number [id number], Laterality: Left  . INSERT / REPLACE / REMOVE PACEMAKER  2004   Medtronic ICD  . KNEE ARTHROSCOPY Left 05/2003   Archie Endo 06/19/2010  . LAPAROSCOPIC CHOLECYSTECTOMY  1/ 2012  . SHOULDER ARTHROSCOPY W/ ROTATOR CUFF REPAIR Right twice  . TONSILLECTOMY AND ADENOIDECTOMY  ~ 1951    Assessment & Plan Clinical Impression:[name] is a 70 y.o. male with history of CAD, A Fib, ICM s/p AICD, COPD, anxiety disorder, GIB, multiple hospitalizations for HCAP in the past few months --most recent for HCAP requiring discharge to home on oxygen. He was readmitted on 11/28/15 with ongoing hypoxia and progressive diffuse bilateral infiltrates.  Dr. Titus Mould felt that infiltrates inflammatory in nature ---question vascular v/s amiodarone toxicity--RA and anti-CCP weakly positive.  Solumedrol added and amiodarone discontinued. He developed VT which accelerated to  VF with appropriate ICD therapy. His device was reprogrammed and he was started on Mexiletine for VT suppression.  He continued to require high flow oxygen due to persistent hypoxemia and was started on BIPAP at nights. He was placed on broad spectrum antibiotics due to possibility of PNA--antibiotics discontinued 10/28 and PCCM recommends monitoring for now. Has improved with high dose  steroids and Dr. Wonda Amis recommends tapering by 10 mg every 2 weeks with outpatient f/u.  To continue to wean Fio2 for saturation > 92% for amiodarone toxicity and will need oxymizer to support oxygen needs.    Respiratory failure highly suspicious for amiodarone toxicity but will need repeat serologies to rule out auto-immune etiology. He has been weaned down to 5 liters at rest and requires 8-10 liters with activity as desaturates to low 70s but recovers with rest.  No recurrent VT on Mexiletine and Ranolazine and in SR but may have recurrence of A fib as amiodarone washes out per Dr. Lovena Le.  Anxiety has resolved with improvement in activity tolerance. CIR recommended for follow up therapy and patient admitted today.  Patient transferred to CIR on 12/06/2015 .    Patient currently requires min with basic self-care skills secondary to muscle weakness, decreased cardiorespiratoy endurance and decreased oxygen support and decreased standing balance.  Prior to hospitalization, patient could complete ADLS with independent .  Patient will benefit from skilled intervention to increase independence with basic self-care skills prior to discharge home with care partner.  Anticipate patient will require intermittent supervision and follow up home health.  OT - End of Session Activity Tolerance: Tolerates < 10 min activity with changes in  vital signs Endurance Deficit: Yes Endurance Deficit Description: patient desaturates to 76% on 10 L 02 via [place] with mobility OT Assessment Rehab Potential (ACUTE ONLY): Good OT Patient demonstrates impairments in the following area(s): Balance;Endurance;Motor OT Basic ADL's Functional Problem(s): Bathing;Dressing;Toileting;Grooming OT Advanced ADL's Functional Problem(s): Light Housekeeping OT Transfers Functional Problem(s): Toilet;Tub/Shower OT Additional Impairment(s): None OT Plan OT Intensity: Minimum of 1-2 x/day, 45 to 90 minutes OT Frequency: 5 out of 7 days OT  Duration/Estimated Length of Stay: 10-12 days OT Treatment/Interventions: Balance/vestibular training;Discharge planning;Functional mobility training;Patient/family education;Self Care/advanced ADL retraining;Therapeutic Activities;UE/LE Strength taining/ROM OT Self Feeding Anticipated Outcome(s): I OT Basic Self-Care Anticipated Outcome(s): S standing at the sink for grooming, S bathing and LB dressing OT Toileting Anticipated Outcome(s): S OT Bathroom Transfers Anticipated Outcome(s): S with RW OT Recommendation Patient destination: Home Follow Up Recommendations: Home health OT Equipment Recommended: None recommended by OT   Skilled Therapeutic Intervention Pt seen for initial evaluation and ADL retraining at shower level.  Pt on 6 L of 02 in bed with O2 sats at 88.  Increased to 8L on portable tank to allow pt to ambulate to bathroom to shower. Throughout the session, numerous rest breaks as pt desats rapidly and it takes several minutes for O2 to increase to 90 or greater. Overall pt needs steadying assist to due balance instability.  Pt needs cues to move slowly, breathe in through nose/ out mouth, pace self.  Spouse arrived at end of session and reviewed goals and POC. They both were in agreement with LOS and S goals.   OT Evaluation Precautions/Restrictions  Precautions Precautions: Fall Precaution Comments: high flow O2 Vital Signs Therapy Vitals Pulse Rate: (!) 59 Resp: 18 Patient Position (if appropriate): Sitting Oxygen Therapy SpO2: 90 % O2 Device: Nasal Cannula O2 Flow Rate (L/min): 8 L/min FiO2 (%): 95 % Pulse Oximetry Type: Intermittent Pain Pain Assessment Pain Assessment: No/denies pain Pain Score: 0-No pain Home Living/Prior Functioning Home Living Living Arrangements: Spouse/significant other Available Help at Discharge: Family, Available 24 hours/day Type of Home: House Home Access: Stairs to enter [employer] of Steps: 2 steps into back  door Entrance Stairs-Rails: None Home Layout: Two level, 1/2 bath on main level, Bed/bath upstairs Alternate Level Stairs-Number of Steps: 13 Alternate Level Stairs-Rails: Right, Left, Can reach both Bathroom Shower/Tub: Multimedia programmer: Standard Bathroom Accessibility: Yes  Lives With: Spouse Prior Function Level of Independence: Independent with transfers, Independent with gait, Independent with basic ADLs  Able to Take Stairs?: Yes Driving: Yes Vocation: Retired Biomedical scientist: drove Actuary Leisure: Hobbies-yes (Comment) Comments: outdoorsman-bass fishing, target practice ADL ADL ADL Comments: refer to functional navigator Vision/Perception  Vision- History Baseline Vision/History: No visual deficits Patient Visual Report: No change from baseline Vision- Assessment Vision Assessment?: No apparent visual deficits Perception Comments: WFL  Cognition Overall Cognitive Status: Within Functional Limits for tasks assessed Arousal/Alertness: Awake/alert Orientation Level: Person;Place;Situation Person: Oriented Place: Oriented Situation: Oriented Year: 2017 Month: October Day of Week: Correct Memory: Appears intact Immediate Memory Recall: Sock;Blue;Bed Memory Recall: Sock;Blue;Bed Memory Recall Sock: Without Cue Memory Recall Blue: Without Cue Memory Recall Bed: Without Cue Safety/Judgment: Appears intact Sensation Sensation Light Touch: Appears Intact Stereognosis: Appears Intact Hot/Cold: Appears Intact Proprioception: Appears Intact Coordination Gross Motor Movements are Fluid and Coordinated: No Fine Motor Movements are Fluid and Coordinated: Yes Coordination and Movement Description: generalized weakness and deconditioning Motor  Motor Motor: Abnormal postural alignment and control Motor - Skilled Clinical Observations: LE weakness Mobility  Bed Mobility Bed  Mobility: Supine to Sit;Sit to Supine Supine to Sit: 5:  Supervision;With rails Sit to Supine: 5: Supervision;With rail  Trunk/Postural Assessment  Cervical Assessment Cervical Assessment: Within Functional Limits Thoracic Assessment Thoracic Assessment: Exceptions to [facility] (kyphotic in static sitting, with cues can hold upright posture) Lumbar Assessment Lumbar Assessment: Within Functional Limits Postural Control Postural Control: Within Functional Limits  Balance Static Standing Balance Static Standing - Level of Assistance: 4: Min assist Dynamic Standing Balance Dynamic Standing - Level of Assistance: 3: Mod assist Extremity/Trunk Assessment RUE Assessment RUE Assessment: Within Functional Limits LUE Assessment LUE Assessment: Within Functional Limits   See Function Navigator for Current Functional Status.   Refer to Care Plan for Long Term Goals  Recommendations for other services: None  Discharge Criteria: Patient will be discharged from OT if patient refuses treatment 3 consecutive times without medical reason, if treatment goals not met, if there is a change in medical status, if patient makes no progress towards goals or if patient is discharged from hospital.  The above assessment, treatment plan, treatment alternatives and goals were discussed and mutually agreed upon: by patient and by family  Lamae Fosco 12/07/2015, 12:39 PM

## 2015-12-07 NOTE — Progress Notes (Signed)
Patient information reviewed and entered into eRehab system by Emric Kowalewski, RN, CRRN, PPS Coordinator.  Information including medical coding and functional independence measure will be reviewed and updated through discharge.     Per nursing patient was given "Data Collection Information Summary for Patients in Inpatient Rehabilitation Facilities with attached "Privacy Act Statement-Health Care Records" upon admission.  

## 2015-12-07 NOTE — Progress Notes (Signed)
Patient with significant hypoxia with activity--was weaned to 5L [place] prior to transfer to rehab. Fatigue is only symptoms --HR stable. Discussed with PCCM NP Gearldine Bienenstock)  who recommended--5-6 L at rest and 10 L with activity. To  "pretreating with 10  Liters" for 30-45 minutes prior to activity. Hold activity and rest to recover for drop below 85% on 10 L. She aslso suggested using BIPAP to recover IF NEEDED for significant hypoxia--order written but was declined by RT as they felt that this was not to be appropriate for current level of care. Patient stable and will monitor for now

## 2015-12-08 ENCOUNTER — Inpatient Hospital Stay (HOSPITAL_COMMUNITY): Payer: Medicare Other | Admitting: Occupational Therapy

## 2015-12-08 ENCOUNTER — Inpatient Hospital Stay (HOSPITAL_COMMUNITY): Payer: Medicare Other | Admitting: Physical Therapy

## 2015-12-08 LAB — URINE CULTURE: CULTURE: NO GROWTH

## 2015-12-08 LAB — MAGNESIUM: Magnesium: 1.9 mg/dL (ref 1.7–2.4)

## 2015-12-08 MED ORDER — NITROFURANTOIN MONOHYD MACRO 100 MG PO CAPS
100.0000 mg | ORAL_CAPSULE | Freq: Two times a day (BID) | ORAL | Status: DC
  Administered 2015-12-08: 100 mg via ORAL
  Filled 2015-12-08: qty 1

## 2015-12-08 NOTE — Progress Notes (Addendum)
Occupational Therapy Session Note  Patient Details  Name: [name] MRN: [id number] Date of Birth: [birth year]  Today's Date: 12/08/2015  Session 1 OT Individual Time: 1100-1200 OT Individual Time Calculation (min): 60 min   Session 2 OT Individual Time: 1430-1533 OT Individual Time Calculation (min): 63 min   Short Term Goals: Week 1:  OT Short Term Goal 1 (Week 1): Pt will complete shower transfers with close S. OT Short Term Goal 2 (Week 1): Pt will be able to tolerate standing for 2 minutes with O2 sats >90. OT Short Term Goal 3 (Week 1): Pt will don pants with close S.  Skilled Therapeutic Interventions/Progress Updates:    Session 1 1:1 OT session focused on activity tolerance, standing endurance, standing balance and transfer training. Pt tolerated 3 minute standing activity x3 with 2 minute rest break in between.  SpO2 remained above 90 throughout dynamic standing actiivity on 10L. Sit<>stand activity w/ increased fatigue, SpO2 dropped top 86, requiring seated rest break and deep breathing techniques to recover to >90. Sit,.stand activity completed for 2nd time, pts SpO2 remained at 89 or above. Pt then self-propelled w/c to ortho gym and completed car transfer with CGA and cues for walker safety and technique. Pt required cues to slow down for safety throughout session. Pt returned to room and was left seated EOB with lunch.    Session 2  1:1 OT session focused on activity tolerance and general strengthening. OT orders changed and pt NOT to be weaned down of O2 at this time- pt to remain on 10L at all times. Pt negotiated stairs x2 with min guard A and 2 standing rest breaks. Facilitated hip, quad, glute, and core strengthening, lateral weight shifting, and trunk extension in tall kneeling activity on mat for ~ 2 minutes prior to reaching max fatigue- desat to 87- recovered to>90 at rest.  Pt completed 4 x 1 minute bouts on nu step, on level 3/4.  Pt desat to 85, recovered  quickly to >90 with 2-3 min rest break. Pt required cues throughout session for safety awareness regarding walker management, and mobility/transfers at a safe pace. OT pushed pt back to room via w/c 2/2 time management, and pt ambulated short distance back to back with RW and min guard A. Pt returned to wall O2 on 10 L with wife present.   Therapy Documentation Precautions:  Precautions Precautions: Fall Precaution Comments: high flow O2 Restrictions Weight Bearing Restrictions: No Pain: Pain Assessment Pain Assessment: No/denies pain Pain Score: 0-No pain ADL: ADL ADL Comments: refer to functional navigator  See Function Navigator for Current Functional Status.   Therapy/Group: Individual Therapy  Mal Amabile 12/08/2015, 3:44 PM

## 2015-12-08 NOTE — Progress Notes (Signed)
[place] PHYSICAL MEDICINE & REHABILITATION     PROGRESS NOTE  Subjective/Complaints:  Pt laying in bed this AM.  He states he finally had a good night's sleep.  Desats noted during therapies yesterday, spoke with pulm regarding recs.  Per pt, he states he just need to push through it.    ROS: +DOE. Denies CP, N/V/D.  Objective: Vital Signs: Blood pressure (!) 112/57, pulse 82, temperature 98.1 F (36.7 C), temperature source Oral, resp. rate 18, height 5\' 3"  (1.6 m), weight 63 kg (139 lb), SpO2 91 %. No results found.  Recent Labs  12/06/15 0326 12/07/15 0500  WBC 12.1* 13.8*  HGB 9.2* 9.4*  HCT 29.3* 29.9*  PLT 430* 401*    Recent Labs  12/06/15 0326 12/07/15 0500  NA 138 137  K 4.1 4.3  CL 104 102  GLUCOSE 98 78  BUN 26* 21*  CREATININE 0.86 0.84  CALCIUM 7.5* 7.7*   CBG (last 3)  No results for input(s): GLUCAP in the last 72 hours.  Wt Readings from Last 3 Encounters:  12/08/15 63 kg (139 lb)  12/06/15 61.7 kg (136 lb)  11/24/15 63.8 kg (140 lb 12 oz)    Physical Exam:  BP (!) 112/57 (BP Location: Left Arm)   Pulse 82   Temp 98.1 F (36.7 C) (Oral)   Resp 18   Ht 5\' 3"  (1.6 m)   Wt 63 kg (139 lb)   SpO2 91%   BMI 24.62 kg/m  Constitutional: He appears cachectic. NAD. HENT: Normocephalicand atraumatic.  Eyes: EOMI. No discharge.  Cardiovascular: Normal rateand regular rhythm. No JVD. Respiratory: +[place]. Effort normaland breath sounds normal. No stridor. No respiratory distress. He has no wheezes.  GI: Soft. Bowel sounds are normal. He exhibits no distension.  Musculoskeletal: He exhibits no edemaor tenderness.  Neurological: He is alertand oriented.  Motor: 4-4+/5 throughout (stable) Skin: Skin is warmand dry.  Not diaphoretic.  Assessment/Plan: 1. Functional deficits secondary to debility from respiratory distress which require 3+ hours per day of interdisciplinary therapy in a comprehensive inpatient rehab setting. Physiatrist is  providing close team supervision and 24 hour management of active medical problems listed below. Physiatrist and rehab team continue to assess barriers to discharge/monitor patient progress toward functional and medical goals.  Function:  Bathing Bathing position   Position: Shower  Bathing parts Body parts bathed by patient: Left arm, Right arm, Chest, Abdomen, Front perineal area, Buttocks, Right upper leg, Left upper leg Body parts bathed by helper: Right lower leg, Left lower leg, Back  Bathing assist Assist Level: Touching or steadying assistance(Pt > 75%)      Upper Body Dressing/Undressing Upper body dressing   What is the patient wearing?: Pull over shirt/dress     Pull over shirt/dress - Perfomed by patient: Thread/unthread right sleeve, Thread/unthread left sleeve, Put head through opening, Pull shirt over trunk          Upper body assist Assist Level: Set up      Lower Body Dressing/Undressing Lower body dressing   What is the patient wearing?: Underwear, Pants, Shoes Underwear - Performed by patient: Thread/unthread right underwear leg, Thread/unthread left underwear leg, Pull underwear up/down   Pants- Performed by patient: Thread/unthread right pants leg, Thread/unthread left pants leg, Pull pants up/down, Fasten/unfasten pants           Shoes - Performed by patient: Don/doff right shoe, Don/doff left shoe            Lower body  assist Assist for lower body dressing: Touching or steadying assistance (Pt > 75%)      Toileting Toileting   Toileting steps completed by patient: Adjust clothing prior to toileting, Performs perineal hygiene, Adjust clothing after toileting   Toileting Assistive Devices: Grab bar or rail  Toileting assist Assist level: Touching or steadying assistance (Pt.75%)   Transfers Chair/bed transfer   Chair/bed transfer method: Stand pivot Chair/bed transfer assist level: Supervision or verbal cues Chair/bed transfer assistive  device: Armrests     Locomotion Ambulation     Max distance: 80 Assist level: Touching or steadying assistance (Pt > 75%)   Wheelchair       Assist Level: Dependent (Pt equals 0%)  Cognition Comprehension Comprehension assist level: Follows complex conversation/direction with no assist  Expression Expression assist level: Expresses complex ideas: With no assist  Social Interaction Social Interaction assist level: Interacts appropriately with others - No medications needed.  Problem Solving Problem solving assist level: Solves complex problems: With extra time  Memory Memory assist level: Complete Independence: No helper     Medical Problem List and Plan: 1.  Weakness and poor activity tolerance secondary to debility from respiratory distress.  Cont CIR 2.  DVT Prophylaxis/Anticoagulation: Pharmaceutical: Other (comment)--Eliquis. H/o GIB in the past. Monitor for H/H.  3. Pain Management: Ultram prn. Local measures with heat/ice prn 4. Mood: Anxiety resolved. LCSW to follow for evaluation and support.  5. Neuropsych: This patient is capable of making decisions on his own behalf. 6. Skin/Wound Care: Maintain adequate nutritional and hydration status. Routine pressure relief measures 7. Fluids/Electrolytes/Nutrition: Monitor I/O.  8. Amiodarone toxicity with respiratory failure: Needs to maintain saturation over 92%--on prednisone 60 mg daily since 10/30.             Taper by 10 mg every 2 weeks with outpatient f/u.   Recs for O2 supplementation per Pulm.  Appreciate recs 9. VT/VF s/p AICD: On Mexitil and Ranexa 10. CAD with chronic systolic CHF with severe LVD: Cath 09/28/15 showed 2 vessel obstructive disease--unchanged from 2004. Unable to tolerate statins -- On Ranexa, Coreg and Cozaar. Lasix on hold. 11.  Leucocytosis:   Likely reactive, afebrile  WBCs 13.8 on 11/2  Cont to monitor  UA slightly+, Ucx pending - will start empiric abx  CXR reviewed 10/31, ?PNA vs edema - cont  to monitor   Labs ordered for Monday 12  Anemia:   Hb 9.4 on 11/2  Cont to monitor  Labs ordered for Monday 13. Diarrhea: Resolved  Cont to monitor  Check c diff if it continues.    14. H/o AFib: In NSR. On eliquis --monitor off amiodarone. On Mexitil every 8 hours.  15. Hypoalbuminemia  Supplement started 11/2  LOS (Days) 2 A FACE TO FACE EVALUATION WAS PERFORMED  Bryce Cheever Karis Juba 12/08/2015 9:14 AM

## 2015-12-08 NOTE — Progress Notes (Signed)
Physical Therapy Session Note  Patient Details  Name: [name] MRN: [id number] Date of Birth: 1945/04/19  Today's Date: 12/08/2015 PT Individual Time: 0800-0900 PT Individual Time Calculation (min): 60 min    Short Term Goals: Week 1:  PT Short Term Goal 1 (Week 1): Patient will ambulate 50 ft using LRAD with close supervision and min cues for safety.  PT Short Term Goal 2 (Week 1): Patient will perform transfers with supervision and min cues for safety.  PT Short Term Goal 3 (Week 1): Patient will negotiate up/down 13 stairs using 2 rails with min A.  PT Short Term Goal 4 (Week 1): Patient will propel wheelchair x 50 ft for strengthening and endurance.   Skilled Therapeutic Interventions/Progress Updates:    Session focused on functional mobility, standing balance, generalized strengthening, activity tolerance, and cardiopulmonary endurance. Patient supine upon arrival reporting fatigue after large bowel movement and requiring prolonged rest. Patient sat EOB to eat breakfast. Patient initially on 8 L 02 via [place] with Sp02 82% after breakfast, increased to 10 L 02 via [place] and remained on 10 L throughout session. Patient donned slip on shoes sitting EOB with setup before requiring supine rest break. Patient returned to sitting for LB dressing with sit <> stand from EOB with supervision and returned to supine for rest. Patient's oxygen dropping to 82-83% on room air with activity requiring frequent supine rest breaks to return > 92%. Performed stand pivot transfers without device with supervision. PT adjusted angle of [place] agility back on wheelchair for improved sitting tolerance. Instructed in Berg Balance Scale with score of 38/56 indicating patient at high risk for falls and Sp02 > 87% monitored continuously during balance test with frequent seated rest breaks due to decreased endurance. Patient returned to supine and left with all needs in reach.   Therapy Documentation Precautions:   Precautions Precautions: Fall Precaution Comments: high flow O2 Restrictions Weight Bearing Restrictions: No Pain: Pain Assessment Pain Assessment: 0-10 Pain Score: 5  Pain Type: Chronic pain Pain Location: Back Pain Orientation: Lower Pain Descriptors / Indicators: Tiring Pain Onset: On-going Pain Intervention(s): Rest Balance: Balance Balance Assessed: Yes Standardized Balance Assessment Standardized Balance Assessment: Berg Balance Test Berg Balance Test Sit to Stand: Able to stand without using hands and stabilize independently Standing Unsupported: Able to stand safely 2 minutes Sitting with Back Unsupported but Feet Supported on Floor or Stool: Able to sit safely and securely 2 minutes Stand to Sit: Sits safely with minimal use of hands Transfers: Able to transfer safely, minor use of hands Standing Unsupported with Eyes Closed: Able to stand 10 seconds with supervision Standing Ubsupported with Feet Together: Able to place feet together independently but unable to hold for 30 seconds From Standing, Reach Forward with Outstretched Arm: Can reach forward >12 cm safely (5") From Standing Position, Pick up Object from Floor: Able to pick up shoe, needs supervision From Standing Position, Turn to Look Behind Over each Shoulder: Looks behind from both sides and weight shifts well Turn 360 Degrees: Needs close supervision or verbal cueing Standing Unsupported, Alternately Place Feet on Step/Stool: Able to complete >2 steps/needs minimal assist Standing Unsupported, One Foot in Front: Loses balance while stepping or standing Standing on One Leg: Tries to lift leg/unable to hold 3 seconds but remains standing independently Total Score: 38/56  See Function Navigator for Current Functional Status.   Therapy/Group: Individual Therapy  Kerney Elbe 12/08/2015, 8:56 AM

## 2015-12-09 ENCOUNTER — Inpatient Hospital Stay (HOSPITAL_COMMUNITY): Payer: Medicare Other | Admitting: Occupational Therapy

## 2015-12-09 ENCOUNTER — Inpatient Hospital Stay (HOSPITAL_COMMUNITY): Payer: Medicare Other | Admitting: Physical Therapy

## 2015-12-09 LAB — MAGNESIUM: Magnesium: 1.9 mg/dL (ref 1.7–2.4)

## 2015-12-09 NOTE — Progress Notes (Signed)
Occupational Therapy Session Note  Patient Details  Name: TRUEN SCHAUL MRN: [id number] Date of Birth: Apr 28, 1945  Today's Date: 12/09/2015 OT Individual Time: 1287-8676 OT Individual Time Calculation (min): 60 min     Short Term Goals: Week 1:  OT Short Term Goal 1 (Week 1): Pt will complete shower transfers with close S. OT Short Term Goal 2 (Week 1): Pt will be able to tolerate standing for 2 minutes with O2 sats >90. OT Short Term Goal 3 (Week 1): Pt will don pants with close S.  Skilled Therapeutic Interventions/Progress Updates:   1:1 Therapeutic activity. Pt sating at 90 on 8 liters in the room; bumped up O2 to 10 liters as we begun activity. Pt performed grooming at sink with Supervision in seated position to conserve energy. In gym focus on activity tolerance with Nustep but only able to perform <1 min before desating to 85%. Transitioned to the mat and laid back on wedge for pursed lip breathing with chest opened up.  Transitioned small circuit transition for activity tolerance, coordination of activity and breathing including modified sit ups from wedge with ball toss, passing ball around back, and short ambulation ~90 feet without AD. Pt did desat during each activity to 84% and with the short distance ambulation 79% but able to recover quickly (within 1-2 min). Pt returned to room to rest. Left on 10 liters in prep for next therapy session.   Therapy Documentation Precautions:  Precautions Precautions: Fall Precaution Comments: high flow O2 Restrictions Weight Bearing Restrictions: No Pain: Pain Assessment Pain Assessment: No/denies pain  Lower back a little sore; chest sore from breathing hard  See Function Navigator for Current Functional Status.   Therapy/Group: Individual Therapy  Roney Mans [facility] 12/09/2015, 10:38 AM

## 2015-12-09 NOTE — Progress Notes (Signed)
Physical Therapy Session Note  Patient Details  Name: [name] MRN: [id number] Date of Birth: Feb 26, 1945  Today's Date: 12/09/2015 PT Individual Time: 1550-1659 PT Individual Time Calculation (min): 69 min    Short Term Goals: Week 1:  PT Short Term Goal 1 (Week 1): Patient will ambulate 50 ft using LRAD with close supervision and min cues for safety.  PT Short Term Goal 2 (Week 1): Patient will perform transfers with supervision and min cues for safety.  PT Short Term Goal 3 (Week 1): Patient will negotiate up/down 13 stairs using 2 rails with min A.  PT Short Term Goal 4 (Week 1): Patient will propel wheelchair x 50 ft for strengthening and endurance.  Week 2:     Skilled Therapeutic Interventions/Progress Updates:     Pt received supine in in bed and agreeable to PT.   PT transported to rehab gym with total Assist for time mangement.   Gait training with Rollator on 10L/m O2 x 235ft with desat to 87% with 5 minute rest break to increase to 97%. Additional Gait training with rollator  with 8L/min O2 x 28ft with desat to 85 while walking to decreased to 79% immediately following, 1 minutes to increase to >85% and remained >90% during rest break.   WC mobility 122ft, 142ft and 169ft on 8-10 L/min Supplemental O2 Remained >93% throughout WC mobillity. Patient reports mild chest pain, that feels muscular in nature the lasted 15 seconds following last bout.   Arm bike 6 bouts of 3 minutes level 4 on 10 L/min O2. SpO2Remained >92% throughout   Gait back to room 116ft  with Rollatoron 10L/min SpO2 desat to 84%; 2 minutes to increase >90%  Patient left supine in bed with call bell in reach.    Therapy Documentation Precautions:  Precautions Precautions: Fall Precaution Comments: high flow O2 Restrictions Weight Bearing Restrictions: No General:   Vital Signs: Therapy Vitals Temp: 98.1 F (36.7 C) Temp Source: Oral Pulse Rate: 61 Resp: 17 BP: 101/64 Patient  Position (if appropriate): Lying Oxygen Therapy SpO2: 97 % O2 Device: Not Delivered O2 Flow Rate (L/min): 10 L/min   See Function Navigator for Current Functional Status.   Therapy/Group: Individual Therapy  Golden Pop 12/09/2015, 5:50 PM

## 2015-12-09 NOTE — Progress Notes (Signed)
Patient ID: [name], male   DOB: 1945-02-21, 70 y.o.   MRN: [id number]   12/09/15.  [place] PHYSICAL MEDICINE & REHABILITATION     PROGRESS NOTE   70 year old patient admitted for CIR with  weakness and poor activity tolerance secondary to debility from respiratory distress.  Subjective/Complaints:  Pt laying in bed this AM.  States that he had another good night last night.  Remains on nasal cannula O2.  No new complaints.    ROS: +DOE. Denies CP, N/V/D.  Past Medical History:  Diagnosis Date  . Acute lower GI bleeding 12/11/2011   "first time" (12/11/2011)  . Acute myocardial infarction, unspecified site, episode of care unspecified 1995   Pt living in [place], no stent, ?PTCA  . AICD (automatic cardioverter/defibrillator) present   . Allergic rhinitis, cause unspecified   . Anxiety   . Arthritis    "all over" (11/07/2015)  . Atrial fibrillation (HCC)    a. 05/2015 - converted to sinus in setting of ICD shocks; placed on eliquis 5 bid.  [name] CAD (coronary artery disease), autologous vein bypass graft   . Cervical herniated disc    told not to lift >10 lbs  . Chronic systolic CHF (congestive heart failure), NYHA class 2 (HCC)    Reports EF of 25%.   [name] COPD (chronic obstructive pulmonary disease) (HCC) 11/2012   by xray  . Diverticulosis    by CT scan  . HCAP (healthcare-associated pneumonia) 11/06/2015  . Hypertension   . Insomnia   . Paroxysmal ventricular tachycardia (HCC)   . Perennial allergic rhinitis    only to dust mites  . Pneumonia 2000s   "walking pneumonia"  . VT (ventricular tachycardia) (HCC)    a. 05/2015 - VT storm with multiple ICD shocks-->Amio 400 BID.     Objective: Vital Signs: Blood pressure 111/61, pulse 70, temperature 98.2 F (36.8 C), temperature source Oral, resp. rate 18, height 5\' 3"  (1.6 m), weight 140 lb 10.5 oz (63.8 kg), SpO2 94 %. No results found.  Recent Labs  12/07/15 0500  WBC 13.8*  HGB 9.4*  HCT 29.9*  PLT 401*     Recent Labs  12/07/15 0500  NA 137  K 4.3  CL 102  GLUCOSE 78  BUN 21*  CREATININE 0.84  CALCIUM 7.7*   CBG (last 3)  No results for input(s): GLUCAP in the last 72 hours.  Wt Readings from Last 3 Encounters:  12/09/15 140 lb 10.5 oz (63.8 kg)  12/06/15 136 lb (61.7 kg)  11/24/15 140 lb 12 oz (63.8 kg)    Physical Exam:  BP 111/61   Pulse 70   Temp 98.2 F (36.8 C) (Oral)   Resp 18   Ht 5\' 3"  (1.6 m)   Wt 140 lb 10.5 oz (63.8 kg)   SpO2 94%   BMI 24.92 kg/m  Constitutional: He appears thin . NAD.  Nasal cannula O2 in place HENT: Normocephalicand atraumatic.  Eyes: EOMI. No discharge.  Cardiovascular: Normal rateand regular rhythm. No JVD.  Grade 2/6 systolic murmur. AICD left upper anterior chest wall Respiratory: +[place]. Effort normaland breath sounds slightly diminished. No stridor. No respiratory distress. He has no wheezes.  GI: Soft. Bowel sounds are normal. He exhibits no distension.  Musculoskeletal: He exhibits no edemaor tenderness.  Neurological: He is alertand oriented.  Motor: 4-4+/5 throughout (stable) Skin: Skin is warmand dry.  Not diaphoretic.  Area of breakdown right lateral ankle  Medical Problem List and Plan: 1.  Weakness and poor activity tolerance secondary to debility from respiratory distress.  Cont CIR 2.  DVT Prophylaxis/Anticoagulation: Pharmaceutical: Other (comment)--Eliquis. H/o GIB in the past. Monitor for H/H.  3. Pain Management: Ultram prn. Local measures with heat/ice prn  4. Skin/Wound Care: Maintain adequate nutritional and hydration status. Routine pressure relief measures  5. Amiodarone toxicity with respiratory failure: Needs to maintain saturation over 92%--on prednisone 60 mg daily since 10/30.             Taper by 10 mg every 2 weeks with outpatient f/u.   Recs for O2 supplementation per Pulm.  Appreciate recs 6. VT/VF s/p AICD: On Mexitil and Ranexa 7. CAD with chronic systolic CHF with severe LVD: Cath  09/28/15 showed 2 vessel obstructive disease--unchanged from 2004. Unable to tolerate statins -- On Ranexa, Coreg and Cozaar. Lasix on hold. 8.  Leucocytosis:   Likely reactive, afebrile  WBCs 13.8 on 11/2  Cont to monitor   Labs ordered for Monday 9  Anemia:   Hb 9.4 on 11/2  Cont to monitor  Labs ordered for Monday 10. Diarrhea: Resolved  Cont to monitor  Check c diff if it continues.    11. H/o AFib: In NSR. On eliquis --monitor off amiodarone. On Mexitil every 8 hours.    LOS (Days) 3 A FACE TO FACE EVALUATION WAS PERFORMED  Rogelia BogaKWIATKOWSKI,Larin Depaoli FRANK 12/09/2015 9:59 AM

## 2015-12-09 NOTE — Progress Notes (Signed)
Physical Therapy Session Note  Patient Details  Name: [name] MRN: [id number] Date of Birth: Jun 07, 1945  Today's Date: 12/09/2015 PT Individual Time: 1300-1400 PT Individual Time Calculation (min): 60 min    Short Term Goals: Week 1:  PT Short Term Goal 1 (Week 1): Patient will ambulate 50 ft using LRAD with close supervision and min cues for safety.  PT Short Term Goal 2 (Week 1): Patient will perform transfers with supervision and min cues for safety.  PT Short Term Goal 3 (Week 1): Patient will negotiate up/down 13 stairs using 2 rails with min A.  PT Short Term Goal 4 (Week 1): Patient will propel wheelchair x 50 ft for strengthening and endurance.   Skilled Therapeutic Interventions/Progress Updates:    Pt resting in bed on arrival, c/o soreness in low back but does not rate, agreeable to therapy session.  Session focus on pain control, activity tolerance, and gait training.  Pt transitioned supine>sit mod I and sit<>stand with supervision.  Gait training to therapy gym with pt pushing w/c, O2 sats 84% arrival.  Pt positioned in supine with heat applied to lower back x15 minutes for pain control with pt reporting success.  Gait training with rollator from therapy gym to therapy apartment and O2 sats remained above 90% with rollator.  Pt ambulated from therapy apartment to day room and O2 sats decreased to 70%.  Pt states, "I think I overdid it," and requesting to return to room in w/c.  O2 returned to >90% within 5 minutes of sitting.  Pt returned to room in w/c and positioned in bed with call bell in reach and needs met.   Therapy Documentation Precautions:  Precautions Precautions: Fall Precaution Comments: high flow O2 Restrictions Weight Bearing Restrictions: No   See Function Navigator for Current Functional Status.   Therapy/Group: Individual Therapy  Earnest Conroy Penven-Crew 12/09/2015, 4:57 PM

## 2015-12-10 ENCOUNTER — Inpatient Hospital Stay (HOSPITAL_COMMUNITY): Payer: Medicare Other | Admitting: Occupational Therapy

## 2015-12-10 LAB — MAGNESIUM: Magnesium: 1.7 mg/dL (ref 1.7–2.4)

## 2015-12-10 NOTE — Progress Notes (Signed)
Patient ID: Leda MinRichard L Buscemi, male   DOB: Apr 30, 1945, 70 y.o.   MRN: [id number]   Patient ID: Leda MinRichard L Eke, male   DOB: Apr 30, 1945, 70 y.o.   MRN: [id number]   12/10/15.  [place] PHYSICAL MEDICINE & REHABILITATION     PROGRESS NOTE   70 year old patient admitted for CIR with  weakness and poor activity tolerance secondary to debility from respiratory distress.  Subjective/Complaints:  Pt laying in bed this AM.  States that he had another good night last night.  Remains on nasal cannula O2.  No new concerns or complaints.    ROS: +DOE. Denies CP, N/V/D.  Past Medical History:  Diagnosis Date  . Acute lower GI bleeding 12/11/2011   "first time" (12/11/2011)  . Acute myocardial infarction, unspecified site, episode of care unspecified 1995   [place], ?PTCA  . AICD (automatic cardioverter/defibrillator) present   . Allergic rhinitis, cause unspecified   . Anxiety   . Arthritis    "all over" (11/07/2015)  . Atrial fibrillation (HCC)    a. 05/2015 - converted to sinus in setting of ICD shocks; placed on eliquis 5 bid.  [name]. CAD (coronary artery disease), autologous vein bypass graft   . Cervical herniated disc    told not to lift >10 lbs  . Chronic systolic CHF (congestive heart failure), NYHA class 2 (HCC)    Reports EF of 25%.   [name]. COPD (chronic obstructive pulmonary disease) (HCC) 11/2012   by xray  . Diverticulosis    by CT scan  . HCAP (healthcare-associated pneumonia) 11/06/2015  . Hypertension   . Insomnia   . Paroxysmal ventricular tachycardia (HCC)   . Perennial allergic rhinitis    only to dust mites  . Pneumonia 2000s   "walking pneumonia"  . VT (ventricular tachycardia) (HCC)    a. 05/2015 - VT storm with multiple ICD shocks-->Amio 400 BID.   BP Readings from Last 3 Encounters:  12/10/15 113/61  12/06/15 118/60  11/24/15 (!) 102/52   Lab Results  Component Value Date   WBC 13.8 (H) 12/07/2015   HGB 9.4 (L) 12/07/2015   HCT 29.9 (L)  12/07/2015   MCV 88.2 12/07/2015   PLT 401 (H) 12/07/2015   Objective: Vital Signs: Blood pressure 113/61, pulse (!) 56, temperature 97.5 F (36.4 C), temperature source Oral, resp. rate 18, height 5\' 3"  (1.6 m), weight 140 lb 10.5 oz (63.8 kg), SpO2 91 %. No results found. No results for input(s): WBC, HGB, HCT, PLT in the last 72 hours.   Wt Readings from Last 3 Encounters:  12/09/15 140 lb 10.5 oz (63.8 kg)  12/06/15 136 lb (61.7 kg)  11/24/15 140 lb 12 oz (63.8 kg)    Physical Exam:  BP 113/61 (BP Location: Right Arm)   Pulse (!) 56   Temp 97.5 F (36.4 C) (Oral)   Resp 18   Ht 5\' 3"  (1.6 m)   Wt 140 lb 10.5 oz (63.8 kg)   SpO2 91%   BMI 24.92 kg/m  Constitutional: He appears thin . NAD.  Nasal cannula O2 in place HENT: Normocephalicand atraumatic.  Eyes: EOMI. No discharge.  Cardiovascular: Normal rateand regular rhythm. No JVD.  Grade 2/6 systolic murmur. AICD left upper anterior chest wall Respiratory: +[place]. Effort normaland breath sounds slightly diminished. No stridor. No respiratory distress. He has no wheezes.  GI: Soft. Bowel sounds are normal. He exhibits no distension.  Musculoskeletal: He exhibits no edemaor tenderness.  Neurological: He is  alertand oriented.   Skin: Skin is warmand dry.  Not diaphoretic.  Area of breakdown right lateral ankle Covered with bandage  Medical Problem List and Plan: 1.  Weakness and poor activity tolerance secondary to debility from respiratory distress.  Cont CIR 2.  DVT Prophylaxis/Anticoagulation: Pharmaceutical: Other (comment)--Eliquis. H/o GIB in the past. Monitor for H/H.    3. Amiodarone toxicity with respiratory failure: Needs to maintain saturation over 92%--on prednisone 60 mg daily since 10/30.             Taper by 10 mg every 2 weeks with outpatient f/u.   4.  VT/VF s/p AICD: On Mexitil and Ranexa 5. CAD with chronic systolic CHF with severe LVD: Cath 09/28/15 showed 2 vessel obstructive  disease--unchanged from 2004. Unable to tolerate statins -- On Ranexa, Coreg and Cozaar. Lasix on hold. 6.  Leucocytosis:   Likely reactive, afebrile  WBCs 13.8 on 11/2  Cont to monitor   7.  Anemia:   Hb 9.4 on 11/2  Cont to monitor    8. H/o AFib: In NSR. On eliquis --monitor off amiodarone. On Mexitil every 8 hours.    LOS (Days) 4 A FACE TO FACE EVALUATION WAS PERFORMED  Rogelia Boga 12/10/2015 10:16 AM

## 2015-12-10 NOTE — Progress Notes (Signed)
Occupational Therapy Session Note  Patient Details  Name: [name] MRN: [id number] Date of Birth: 01/20/1946  Today's Date: 12/10/2015 OT Individual Time: 7096-2836 OT Individual Time Calculation (min): 77 min   Short Term Goals: Week 1:  OT Short Term Goal 1 (Week 1): Pt will complete shower transfers with close S. OT Short Term Goal 2 (Week 1): Pt will be able to tolerate standing for 2 minutes with O2 sats >90. OT Short Term Goal 3 (Week 1): Pt will don pants with close S.  Skilled Therapeutic Interventions/Progress Updates:   Patient participated in  Skilled OT as follows:      Cabinet and refrigerator access walker level with distant S and assist to pull O2 tank;      Was able to dry Swifter the ADL kitchen floor without an assistive device and close S and this cliniican pulling 02 tank.  As well, patient participated in various endurance activities seated and standing, with O2 at 94 and 10  Liters oxygen applied  Patient assisted back to bed at session's end with supportive wife, phone andcall bell within reach  Therapy Documentation Precautions:  Precautions Precautions: Fall Precaution Comments: high flow O2 Restrictions Weight Bearing Restrictions: No    Vital Signs: 94 during OT session on 10 L oxygen  Pain: Pain Assessment Pain Assessment: No/denies pain See Function Navigator for Current Functional Status.   Therapy/Group: Individual Therapy  [name] [name] 12/10/2015, 4:58 PM

## 2015-12-11 ENCOUNTER — Inpatient Hospital Stay (HOSPITAL_COMMUNITY): Payer: Medicare Other | Admitting: Occupational Therapy

## 2015-12-11 ENCOUNTER — Inpatient Hospital Stay (HOSPITAL_COMMUNITY): Payer: Medicare Other | Admitting: Physical Therapy

## 2015-12-11 ENCOUNTER — Encounter (HOSPITAL_COMMUNITY): Payer: Medicare Other

## 2015-12-11 DIAGNOSIS — R195 Other fecal abnormalities: Secondary | ICD-10-CM

## 2015-12-11 DIAGNOSIS — I200 Unstable angina: Secondary | ICD-10-CM | POA: Insufficient documentation

## 2015-12-11 DIAGNOSIS — T462X1A Poisoning by other antidysrhythmic drugs, accidental (unintentional), initial encounter: Secondary | ICD-10-CM

## 2015-12-11 LAB — CBC WITH DIFFERENTIAL/PLATELET
BASOS ABS: 0 10*3/uL (ref 0.0–0.1)
BASOS PCT: 0 %
EOS PCT: 1 %
Eosinophils Absolute: 0.1 10*3/uL (ref 0.0–0.7)
HEMATOCRIT: 31.3 % — AB (ref 39.0–52.0)
Hemoglobin: 9.7 g/dL — ABNORMAL LOW (ref 13.0–17.0)
Lymphocytes Relative: 9 %
Lymphs Abs: 1.7 10*3/uL (ref 0.7–4.0)
MCH: 27.1 pg (ref 26.0–34.0)
MCHC: 31 g/dL (ref 30.0–36.0)
MCV: 87.4 fL (ref 78.0–100.0)
MONO ABS: 1.2 10*3/uL — AB (ref 0.1–1.0)
MONOS PCT: 7 %
NEUTROS ABS: 15.2 10*3/uL — AB (ref 1.7–7.7)
Neutrophils Relative %: 83 %
PLATELETS: 382 10*3/uL (ref 150–400)
RBC: 3.58 MIL/uL — ABNORMAL LOW (ref 4.22–5.81)
RDW: 16 % — AB (ref 11.5–15.5)
WBC: 18.2 10*3/uL — ABNORMAL HIGH (ref 4.0–10.5)

## 2015-12-11 LAB — TROPONIN I
TROPONIN I: 0.04 ng/mL — AB (ref <0.03)
Troponin I: 0.04 ng/mL (ref <0.03)

## 2015-12-11 LAB — BASIC METABOLIC PANEL
Anion gap: 8 (ref 5–15)
BUN: 24 mg/dL — AB (ref 6–20)
CALCIUM: 8.1 mg/dL — AB (ref 8.9–10.3)
CO2: 27 mmol/L (ref 22–32)
Chloride: 104 mmol/L (ref 101–111)
Creatinine, Ser: 0.85 mg/dL (ref 0.61–1.24)
GFR calc Af Amer: >60 mL/min (ref >60)
GLUCOSE: 110 mg/dL — AB (ref 65–99)
Potassium: 4.1 mmol/L (ref 3.5–5.1)
Sodium: 139 mmol/L (ref 135–145)

## 2015-12-11 LAB — MAGNESIUM: MAGNESIUM: 1.8 mg/dL (ref 1.7–2.4)

## 2015-12-11 MED ORDER — LOPERAMIDE HCL 2 MG PO CAPS
2.0000 mg | ORAL_CAPSULE | ORAL | Status: DC | PRN
  Administered 2015-12-11 – 2015-12-17 (×8): 2 mg via ORAL
  Filled 2015-12-11 (×8): qty 1

## 2015-12-11 MED ORDER — NITROGLYCERIN 0.4 MG SL SUBL
0.4000 mg | SUBLINGUAL_TABLET | SUBLINGUAL | Status: DC | PRN
  Administered 2015-12-11: 0.4 mg via SUBLINGUAL
  Filled 2015-12-11: qty 1

## 2015-12-11 MED ORDER — FUROSEMIDE 20 MG PO TABS
20.0000 mg | ORAL_TABLET | ORAL | Status: DC
  Administered 2015-12-11 – 2015-12-13 (×2): 20 mg via ORAL
  Filled 2015-12-11 (×3): qty 1

## 2015-12-11 NOTE — Progress Notes (Signed)
Pt with episode of chest pain, please see previous note and PA note.

## 2015-12-11 NOTE — Progress Notes (Signed)
Physical Therapy Session Note  Patient Details  Name: [name] MRN: [id number] Date of Birth: [birth year]  Today's Date: 12/11/2015 PT Individual Time: 1402-1430 and 9741-6384 PT Individual Time Calculation (min): 28 min and 24 min   Short Term Goals: Week 1:  PT Short Term Goal 1 (Week 1): Patient will ambulate 50 ft using LRAD with close supervision and min cues for safety.  PT Short Term Goal 2 (Week 1): Patient will perform transfers with supervision and min cues for safety.  PT Short Term Goal 3 (Week 1): Patient will negotiate up/down 13 stairs using 2 rails with min A.  PT Short Term Goal 4 (Week 1): Patient will propel wheelchair x 50 ft for strengthening and endurance.   Skilled Therapeutic Interventions/Progress Updates:    Treatment 1: Pt received in bed & agreeable to tx, noting 3/10 back pain but premedicated. Pt's SpO2 = 93%, HR = 64 bpm at rest on 10 L/min. Pt agreeable to ambulation and transferred OOB with Mod I & use of hospital bed features. Gait training x 270 ft (standing rest break after 120 ft) with rollator and supervision. Pt demonstrates increased gait & requires max cuing for decreased gait speed for energy conservation and pursed lip breathing with poor demo. During standing break pt's oxygen decreased to 80% on 10L/min and required extra time with pursed lip breathing to increase to 93%. Pt returned to room and transferred to sitting on EOB, SpO2 = 78% HR = 85 bpm. Pt performing pursed lip breathing to increase oxygen saturation when pt noted chest pain & pain in shoulder blade with HR increased to 105 bpm; RN called into room & made aware. Pt's symptoms began to alleviate ~1-2 minutes & HR decreased to 70-80's. Pt returned to bed & scooted to head of bed with use of bed rails & after task noted same chest pain again, RN present. Pt had removed nasal cannula for ~1 minute to wipe nose & SpO2 dropped to within the 70's and required significantly extra time to  increase to 93% on 10L/min. Pt left in bed with all needs within reach & RN present.   Treatment 2: Pt received in bed & agreeable to tx, noting 5/10 chronic pain in shoulders, chest & back & RN made aware. Provided pt with energy conservation handout & discussed various energy conservation techniques for showering, completing light housework (dishes), ambulation, and getting dressed/ready for the day. Pt verbalized understanding of information and therapist encouraged him to continue verbalizing how he is feeling during therapy sessions. Pt then declined bed level exercises 2/2 fatigue after episode this afternoon. Pt left in bed with all needs within reach & wife present.   Therapy Documentation Precautions:  Precautions Precautions: Fall Precaution Comments: high flow O2 Restrictions Weight Bearing Restrictions: No   General: PT Amount of Missed Time (min): 31 Minutes + 36 minutes (fatigue) PT Missed Treatment Reason: Nursing care   See Function Navigator for Current Functional Status.   Therapy/Group: Individual Therapy  Sandi Mariscal 12/11/2015, 2:40 PM

## 2015-12-11 NOTE — Progress Notes (Signed)
Occupational Therapy Session Note  Patient Details  Name: [name] MRN: [id number] Date of Birth: 1945/09/23  Today's Date: 12/11/2015 OT Individual Time: 1257-1330 OT Individual Time Calculation (min): 33 min     Short Term Goals: Week 1:  OT Short Term Goal 1 (Week 1): Pt will complete shower transfers with close S. OT Short Term Goal 2 (Week 1): Pt will be able to tolerate standing for 2 minutes with O2 sats >90. OT Short Term Goal 3 (Week 1): Pt will don pants with close S.  Skilled Therapeutic Interventions/Progress Updates:   Skilled OT session completed with focus on UE strengthening while maintaining stable vitals. Pt reported feeling better than he did this morning when he experienced extreme SOB. Pt was agreeable to tx. Resting 02 sats while supine in bed 94% on 10L. UE exercises with orange theraband completed at EOB with instruction on technique and breathing coordination. Frequent rest breaks provided to monitor 02 sats, which dropped to 88% once (increasing to 93% in less than one minute), and remained <92% for the rest of tx. Pt then returned to bed and was left with all needs within reach.   Therapy Documentation Precautions:  Precautions Precautions: Fall Precaution Comments: high flow O2 Restrictions Weight Bearing Restrictions: No General: General PT Missed Treatment Reason: Patient unwilling to participate (fatigue) Vital Signs: Therapy Vitals Pulse Rate:  (64) Resp: 16 BP: 94/62 Patient Position (if appropriate): Lying Oxygen Therapy SpO2: 94 % O2 Device: High Flow Nasal Cannula O2 Flow Rate (L/min): 5 L/min Pain: No c/o pain during session  Pain Assessment Pain Assessment: 0-10 Pain Score: 5  Pain Location: Chest Pain Orientation: Left Pain Descriptors / Indicators: Aching Pain Onset: With Activity Patients Stated Pain Goal: 3 ADL: ADL ADL Comments: refer to functional navigator:    See Function Navigator for Current Functional  Status.  Exercises: with orange theraband 2 sets 10 reps: bicep curls, tricep punches, diagonals, shoulder flexion    Therapy/Group: Individual Therapy  Luciel Brickman A Lux Skilton 12/11/2015, 7:45 PM

## 2015-12-11 NOTE — Progress Notes (Signed)
Occupational Therapy Session Note  Patient Details  Name: [name] MRN: [id number] Date of Birth: 1945-02-14  Today's Date: 12/11/2015  Session 1 OT Individual Time: 1000-1040 OT Individual Time Calculation (min): 40 min    Short Term Goals: Week 1:  OT Short Term Goal 1 (Week 1): Pt will complete shower transfers with close S. OT Short Term Goal 2 (Week 1): Pt will be able to tolerate standing for 2 minutes with O2 sats >90. OT Short Term Goal 3 (Week 1): Pt will don pants with close S.  Skilled Therapeutic Interventions/Progress Updates:    Session 1 1:1 OT session focused on activity tolerance and endurance to complete modified bathing/dressing. Pt limited to sponge bath at EOB today 2/2 increased [name] and chest tightness. Pt on 8L at rest, desat to 60 sitting EOB when removed nasal canula to blow his nose- recovered to 90 with deep breathing techniques and rest. Pt bumped to 10L O2 for ADL activity. Pt desat to 86 with sit<>stands w/ continued chest tightness. RN notified and contacted respiratory therapy. Pt declines further participation in OT at this time. Pt left semi-reclined in bed on 10L O2, SpO2 at 94.   Therapy Documentation Precautions:  Precautions Precautions: Fall Precaution Comments: high flow O2 Restrictions Weight Bearing Restrictions: No General: General OT Amount of Missed Time: 20 Minutes Pain:  none/denies pain ADL: ADL ADL Comments: refer to functional navigator    See Function Navigator for Current Functional Status.   Therapy/Group: Individual Therapy  [name] 12/11/2015, 10:41 AM

## 2015-12-11 NOTE — Progress Notes (Addendum)
Advanced Heart Failure Rounding Note   PCP: Crawford GivensGraham Duncan, MD Primary Cardiologist/EP: Dr. Ladona Ridgelaylor  Subjective:    Initially admitted 11/28/15 with respiratory failure.  Had VT with shock 11/29/15. EP followed. Tried on lidocaine and ranolazine ( no amio with toxicity). Thought to have some lidocaine toxicity vs low output.  PICC placed with Coox 65%.  Lidocaine stopped with resolution of AMS.   Required NRB with de-sats into 70-80s while sleeping.   Moved to CIR 12/06/15  Feeling like he's improving. Having loose stools (chronic). Says he has gained weight but is closer to his previous baseline. ~ 145 lbs.  Weight up 6 lbs since coming up to CIR. Creatinine and K stable.   Objective:   Weight Range: 145 lb 8.1 oz (66 kg) Body mass index is 25.77 kg/m.   Vital Signs:   Temp:  [97.8 F (36.6 C)-98.4 F (36.9 C)] 98.4 F (36.9 C) (11/06 0452) Pulse Rate:  [55-60] 55 (11/06 0452) Resp:  [18] 18 (11/06 0452) BP: (106-109)/(57-68) 106/68 (11/06 0452) SpO2:  [95 %-97 %] 95 % (11/06 0452) Weight:  [145 lb 8.1 oz (66 kg)] 145 lb 8.1 oz (66 kg) (11/06 0452) Last BM Date: 12/10/15  Weight change: Filed Weights   12/08/15 0500 12/09/15 0540 12/11/15 0452  Weight: 139 lb (63 kg) 140 lb 10.5 oz (63.8 kg) 145 lb 8.1 oz (66 kg)    Intake/Output:   Intake/Output Summary (Last 24 hours) at 12/11/15 0904 Last data filed at 12/11/15 0900  Gross per 24 hour  Intake             1500 ml  Output                0 ml  Net             1500 ml     Physical Exam: General:  Well appearing. Lying in bed. HEENT: Normal, with some temporal wasting.   Neck: supple. JVP 6-7 cm. Carotids 2+ bilat; no bruits. No thyromegaly or nodule noted.  Cor: PMI nondisplaced. Regular. 3/6 HSM apex.  Lungs: Clear, normal effort Abdomen: soft, NT, ND, no HSM. No bruits or masses. +BS  Extremities: no cyanosis, clubbing, rash. No peripheral edema Neuro: alert & orientedx3, cranial nerves grossly  intact. moves all 4 extremities w/o difficulty. Affect flat.   Labs: CBC No results for input(s): WBC, NEUTROABS, HGB, HCT, MCV, PLT in the last 72 hours. Basic Metabolic Panel  Recent Labs  12/10/15 0443 12/11/15 0543  MG 1.7 1.8   Liver Function Tests No results for input(s): AST, ALT, ALKPHOS, BILITOT, PROT, ALBUMIN in the last 72 hours. No results for input(s): LIPASE, AMYLASE in the last 72 hours. Cardiac Enzymes No results for input(s): CKTOTAL, CKMB, CKMBINDEX, TROPONINI in the last 72 hours.  BNP: BNP (last 3 results)  Recent Labs  09/27/15 0658 11/07/15 0253 11/28/15 0737  BNP 184.8* 184.2* 494.0*    ProBNP (last 3 results) No results for input(s): PROBNP in the last 8760 hours.   D-Dimer No results for input(s): DDIMER in the last 72 hours. Hemoglobin A1C No results for input(s): HGBA1C in the last 72 hours. Fasting Lipid Panel No results for input(s): CHOL, HDL, LDLCALC, TRIG, CHOLHDL, LDLDIRECT in the last 72 hours. Thyroid Function Tests No results for input(s): TSH, T4TOTAL, T3FREE, THYROIDAB in the last 72 hours.  Invalid input(s): FREET3  Other results:     Imaging/Studies:   No results found.  Latest Echo  Latest Cath   Medications:     Scheduled Medications: . apixaban  5 mg Oral BID  . aspirin EC  81 mg Oral Daily  . carvedilol  3.125 mg Oral BID WC  . donepezil  10 mg Oral QHS  . famotidine  20 mg Oral Daily  . feeding supplement  1 Container Oral TID BM  . feeding supplement (PRO-STAT SUGAR FREE 64)  30 mL Oral BID  . guaiFENesin  600 mg Oral BID  . loratadine  10 mg Oral Daily  . losartan  12.5 mg Oral Daily  . mouth rinse  15 mL Mouth Rinse BID  . mexiletine  200 mg Oral Q8H  . multivitamin with minerals  1 tablet Oral Daily  . predniSONE  60 mg Oral Q breakfast  . ranolazine  500 mg Oral BID     Infusions:   PRN Medications:  acetaminophen, alum & mag hydroxide-simeth, benzonatate, bisacodyl, calcium  carbonate, diazepam, diphenhydrAMINE, guaiFENesin-dextromethorphan, levalbuterol, loperamide, metaxalone, prochlorperazine **OR** prochlorperazine **OR** prochlorperazine, sodium phosphate, traMADol, zolpidem   Assessment/Plan   1. Chronic systolic CHF: Ischemic cardiomyopathy.  Echo 10/17 with EF 25-30%, severe MR.  Has Med[employer]onic ICD.  CVP 3, co-ox 60%.  SBP 100s-110s. - Continue Coreg 3.125 mg bid. - Continue losartan 12.5 daily.   - Weight up since coming to CIR, but volume status looks stable.  - May eventually need lasix, at least prn but looks stable for now.  2. VT: Has Medtronic ICD.  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  - Neurological symptoms resolved off of lidocaine. ? Component of lidocaine toxicity as well.  - He remains on mexiletine + ranolazine. No more VT noted. 3. Acute hypoxic respiratory failure: Diffuse interstitial airspace disease on CXR.  CCM following.  Suspect amiodarone lung toxicity and not PNA or pulmonary edema.  - Continues on prednisone taper per primary.  4. CAD: LHC 09/28/15 with chronic total occlusion of the proximal RCA and moderate disease in Distal LCx and third OM.  He does not tolerate statins.   5. Hyperthyroidism: ? 2/2 amiodarone toxicity.  Management per [facility].  6. Atrial fibrillation: Paroxysmal, currently in NSR.  Continue apixaban.  7. Debilitated - Improving. Continue CIR.    Looks stable from HF perspective.  We will continue to follow prn.   Length of Stay: 5  Graciella Freer PA-C 12/11/2015, 9:04 AM  Advanced Heart Failure Team Pager [phone] (M-F; 7a - 4p)  Please contact [facility] for night-coverage after hours (4p -7a ) and weekends on [email or web address]  Patient seen and examined with Otilio Saber, PA-C. We discussed all aspects of the encounter. I agree with the assessment and plan as stated above.   He looks so much better. O2 requirement improved. Volume status looks good. Weight up 5 pounds.  Would continue regimen and add lasix 20 mg three times weekly (M/W/F).   Murline Weigel,MD 10:10 AM

## 2015-12-11 NOTE — Progress Notes (Signed)
[name]IC[facility] & RE[facility]ABILITATION     PROGRESS NOTE  Subjective/Complaints:  Pt seen laying in bed this AM.  He slept well overnight.  He had a good weekend.  He requests immodium for chronic loose stools, states lomotil not working.   ROS: +DOE, loose stools. Denies CP, N/V/D.  Objective: Vital Signs: Blood pressure 106/68, pulse (!) 55, temperature 98.4 F (36.9 C), temperature source Oral, resp. rate 18, height 5\' 3"  (1.6 m), weight 66 kg (145 lb 8.1 oz), SpO2 95 %. No results found. No results for input(s): WBC, HGB, HCT, PLT in the last 72 hours. No results for input(s): NA, K, CL, GLUCOSE, BUN, CREATININE, CALCIUM in the last 72 hours.  Invalid input(s): CO CBG (last 3)  No results for input(s): GLUCAP in the last 72 hours.  Wt Readings from Last 3 Encounters:  12/11/15 66 kg (145 lb 8.1 oz)  12/06/15 61.7 kg (136 lb)  11/24/15 63.8 kg (140 lb 12 oz)    Physical Exam:  BP 106/68 (BP Location: Right Arm)   Pulse (!) 55   Temp 98.4 F (36.9 C) (Oral)   Resp 18   Ht 5\' 3"  (1.6 m)   Wt 66 kg (145 lb 8.1 oz)   SpO2 95%   BMI 25.77 kg/m  Constitutional: He appears cachectic. NAD. HENT: Normocephalicand atraumatic.  Eyes: EOMI. No discharge.  Cardiovascular: Normal rateand regular rhythm. No JVD. Respiratory: +[place]. Effort normaland breath sounds normal. No stridor. No respiratory distress. He has no wheezes.  GI: Soft. Bowel sounds are normal. He exhibits no distension.  Musculoskeletal: He exhibits no edemaor tenderness.  Neurological: He is alertand oriented.  Motor: 4+/5 throughout Skin: Skin is warmand dry.  Not diaphoretic.  Assessment/Plan: 1. Functional deficits secondary to debility from respiratory distress which require 3+ hours per day of interdisciplinary therapy in a comprehensive inpatient rehab setting. Physiatrist is providing close team supervision and 24 hour management of active medical problems listed below. Physiatrist and  rehab team continue to assess barriers to discharge/monitor patient progress toward functional and medical goals.  Function:  Bathing Bathing position   Position: Shower  Bathing parts Body parts bathed by patient: Left arm, Right arm, Chest, Abdomen, Front perineal area, Buttocks, Right upper leg, Left upper leg Body parts bathed by helper: Right lower leg, Left lower leg, Back  Bathing assist Assist Level: Touching or steadying assistance(Pt > 75%)      Upper Body Dressing/Undressing Upper body dressing   What is the patient wearing?: Pull over shirt/dress     Pull over shirt/dress - Perfomed by patient: Thread/unthread right sleeve, Thread/unthread left sleeve, Put head through opening, Pull shirt over trunk          Upper body assist Assist Level: Set up      Lower Body Dressing/Undressing Lower body dressing   What is the patient wearing?: Underwear, Pants, Shoes Underwear - Performed by patient: Thread/unthread right underwear leg, Thread/unthread left underwear leg, Pull underwear up/down   Pants- Performed by patient: Thread/unthread right pants leg, Thread/unthread left pants leg, Pull pants up/down, Fasten/unfasten pants           Shoes - Performed by patient: Don/doff right shoe, Don/doff left shoe            Lower body assist Assist for lower body dressing: Touching or steadying assistance (Pt > 75%)      Toileting Toileting   Toileting steps completed by patient: Adjust clothing prior to toileting, Performs perineal  hygiene, Adjust clothing after toileting   Toileting Assistive Devices: Grab bar or rail  Toileting assist Assist level: Supervision or verbal cues   Transfers Chair/bed transfer   Chair/bed transfer method: Squat pivot, Ambulatory Chair/bed transfer assist level: Supervision or verbal cues Chair/bed transfer assistive device: Patent attorneyWalker     Locomotion Ambulation     Max distance: 200 Assist level: Supervision or verbal cues    Wheelchair       Assist Level: Dependent (Pt equals 0%)  Cognition Comprehension Comprehension assist level: Understands complex 90% of the time/cues 10% of the time  Expression Expression assist level: Expresses complex 90% of the time/cues < 10% of the time  Social Interaction Social Interaction assist level: Interacts appropriately 90% of the time - Needs monitoring or encouragement for participation or interaction.  Problem Solving Problem solving assist level: Solves complex 90% of the time/cues < 10% of the time  Memory Memory assist level: Recognizes or recalls 90% of the time/requires cueing < 10% of the time     Medical Problem List and Plan: 1.  Weakness and poor activity tolerance secondary to debility from respiratory distress.  Cont CIR 2.  DVT Prophylaxis/Anticoagulation: Pharmaceutical: Other (comment)--Eliquis. H/o GIB in the past. Monitor for H/H.  3. Pain Management: Ultram prn. Local measures with heat/ice prn 4. Mood: Anxiety resolved. LCSW to follow for evaluation and support.  5. Neuropsych: This patient is capable of making decisions on his own behalf. 6. Skin/Wound Care: Maintain adequate nutritional and hydration status. Routine pressure relief measures 7. Fluids/Electrolytes/Nutrition: Monitor I/O.  8. Amiodarone toxicity with respiratory failure: Needs to maintain saturation over 92%--on prednisone 60 mg daily since 10/30.             Taper by 10 mg every 2 weeks with outpatient f/u.   Recs for O2 supplementation per Pulm.  Appreciate recs 9. VT/VF s/p AICD: On Mexitil and Ranexa 10. CAD with chronic systolic CHF with severe LVD: Cath 09/28/15 showed 2 vessel obstructive disease--unchanged from 2004. Unable to tolerate statins -- On Ranexa, Coreg and Cozaar. Lasix on hold. 11.  Leucocytosis:   Likely reactive, afebrile  WBCs 13.8 on 11/2  Cont to monitor  UA slightly+, Ucx NG, abx d/ced  CXR reviewed 10/31, ?PNA vs edema - cont to monitor   Labs  pending 12  Anemia:   Hb 9.4 on 11/2  Cont to monitor  Labs pending 13. Diarrhea: Resolved  Cont to monitor  But chronic loose stools after cholecystectomy, immodium started after BMs (home reg) 14. H/o AFib: In NSR. On eliquis --monitor off amiodarone. On Mexitil every 8 hours.  15. Hypoalbuminemia  Supplement started 11/2  LOS (Days) 5 A FACE TO FACE EVALUATION WAS PERFORMED  [name] 12/11/2015 8:43 AM

## 2015-12-11 NOTE — Progress Notes (Signed)
Patient reported to have episode of CP today when walking back from [place]. He reports that it lasted 1-2 minutes then resolved. Oxygen levels reported to drop to 70s. He did have recurrent episode while repositioning in bed and symptoms resolved with dose of NTG. He did report an episode yesterday but did not inform staff. EKG done and reviewed by Dr. Hale Drone changes noted. Will monitor for now.

## 2015-12-11 NOTE — Progress Notes (Addendum)
Called to room by PT, pt reported chest pain non radiating lft side of /central chest with "tightness" "elephant sitting on chest", reported"rib cage not big enough, cannot expand enough to breath or get air into chest" o2 sat 78% from 88% when walking in hallway. Sat on edge of bed o2 at 10LNC and o2 sat came up to 95%. Chest pain easing off however not totally relieved by resting. Pam Love PAC notified, new orders received. Vitals taken and pt noted that this same thing happened yesterday when walking however did subside once back to bed. Pain lasted only brief period, once reported lasted approx minute long. New order for nitroglycerin; med administered. Reported chest pain subsided, did get headache with medication. Rechecked vitals, see flowsheets. Pt talking and reported feeling better. Approximately 1448 pt reported "heat" left side of neck/face that passed briefly as it was noted. Labs drawn per order and pt now states he feels "fine", "normal". Pam in to see pt and reviewed EKG results to compare with previous EKGs. Pamelia Hoit

## 2015-12-11 NOTE — Progress Notes (Signed)
Reported feeling tightness return in chest with activity and [place] just completed exercises. Hold on NTg for now vital taken o2 94% . See flowsheet. Able to converse with wife and staff, reported pain under ICD left chest. "dull ache" sensation under ICD . Marissa Nestle PAC notified and checked on pt. Pamelia Hoit

## 2015-12-12 ENCOUNTER — Inpatient Hospital Stay (HOSPITAL_COMMUNITY): Payer: Medicare Other | Admitting: Physical Therapy

## 2015-12-12 ENCOUNTER — Inpatient Hospital Stay (HOSPITAL_COMMUNITY): Payer: Medicare Other | Admitting: Occupational Therapy

## 2015-12-12 DIAGNOSIS — I2510 Atherosclerotic heart disease of native coronary artery without angina pectoris: Secondary | ICD-10-CM

## 2015-12-12 DIAGNOSIS — Z9049 Acquired absence of other specified parts of digestive tract: Secondary | ICD-10-CM

## 2015-12-12 DIAGNOSIS — R197 Diarrhea, unspecified: Secondary | ICD-10-CM

## 2015-12-12 DIAGNOSIS — D638 Anemia in other chronic diseases classified elsewhere: Secondary | ICD-10-CM

## 2015-12-12 DIAGNOSIS — Z8719 Personal history of other diseases of the digestive system: Secondary | ICD-10-CM

## 2015-12-12 DIAGNOSIS — I25119 Atherosclerotic heart disease of native coronary artery with unspecified angina pectoris: Secondary | ICD-10-CM

## 2015-12-12 DIAGNOSIS — F418 Other specified anxiety disorders: Secondary | ICD-10-CM

## 2015-12-12 DIAGNOSIS — R0600 Dyspnea, unspecified: Secondary | ICD-10-CM

## 2015-12-12 DIAGNOSIS — D72820 Lymphocytosis (symptomatic): Secondary | ICD-10-CM

## 2015-12-12 DIAGNOSIS — I200 Unstable angina: Secondary | ICD-10-CM

## 2015-12-12 DIAGNOSIS — I5022 Chronic systolic (congestive) heart failure: Secondary | ICD-10-CM

## 2015-12-12 DIAGNOSIS — T462X1S Poisoning by other antidysrhythmic drugs, accidental (unintentional), sequela: Secondary | ICD-10-CM

## 2015-12-12 DIAGNOSIS — Z95810 Presence of automatic (implantable) cardiac defibrillator: Secondary | ICD-10-CM

## 2015-12-12 DIAGNOSIS — R195 Other fecal abnormalities: Secondary | ICD-10-CM

## 2015-12-12 DIAGNOSIS — Z8679 Personal history of other diseases of the circulatory system: Secondary | ICD-10-CM

## 2015-12-12 DIAGNOSIS — Z9981 Dependence on supplemental oxygen: Secondary | ICD-10-CM

## 2015-12-12 LAB — CBC
HEMATOCRIT: 32 % — AB (ref 39.0–52.0)
HEMOGLOBIN: 9.9 g/dL — AB (ref 13.0–17.0)
MCH: 26.9 pg (ref 26.0–34.0)
MCHC: 30.9 g/dL (ref 30.0–36.0)
MCV: 87 fL (ref 78.0–100.0)
Platelets: 389 10*3/uL (ref 150–400)
RBC: 3.68 MIL/uL — ABNORMAL LOW (ref 4.22–5.81)
RDW: 16.1 % — AB (ref 11.5–15.5)
WBC: 19 10*3/uL — ABNORMAL HIGH (ref 4.0–10.5)

## 2015-12-12 LAB — BASIC METABOLIC PANEL
ANION GAP: 7 (ref 5–15)
BUN: 30 mg/dL — AB (ref 6–20)
CHLORIDE: 103 mmol/L (ref 101–111)
CO2: 28 mmol/L (ref 22–32)
Calcium: 8 mg/dL — ABNORMAL LOW (ref 8.9–10.3)
Creatinine, Ser: 0.89 mg/dL (ref 0.61–1.24)
GFR calc Af Amer: >60 mL/min (ref >60)
GLUCOSE: 100 mg/dL — AB (ref 65–99)
POTASSIUM: 4.3 mmol/L (ref 3.5–5.1)
Sodium: 138 mmol/L (ref 135–145)

## 2015-12-12 LAB — TROPONIN I
TROPONIN I: 0.04 ng/mL — AB (ref <0.03)
TROPONIN I: 0.03 ng/mL — AB (ref <0.03)
Troponin I: 0.04 ng/mL (ref <0.03)
Troponin I: 0.04 ng/mL (ref <0.03)

## 2015-12-12 LAB — MAGNESIUM: Magnesium: 1.7 mg/dL (ref 1.7–2.4)

## 2015-12-12 MED ORDER — LEVALBUTEROL HCL 1.25 MG/0.5ML IN NEBU
1.2500 mg | INHALATION_SOLUTION | Freq: Three times a day (TID) | RESPIRATORY_TRACT | Status: DC
  Administered 2015-12-12 (×2): 1.25 mg via RESPIRATORY_TRACT
  Filled 2015-12-12 (×3): qty 0.5

## 2015-12-12 NOTE — Progress Notes (Addendum)
PHYSICAL MEDICINE & REHABILITATION     PROGRESS NOTE  Subjective/Complaints:  Pt seen laying in bed this AM.  He states he feels much better than yesterday after having chest pain.  He states he needs to learn not to overdue it.    ROS: +DOE. Denies CP, N/V/D.  Objective: Vital Signs: Blood pressure (!) 104/58, pulse 78, temperature 97.8 F (36.6 C), temperature source Oral, resp. rate 18, height 5\' 3"  (1.6 m), weight 60 kg (132 lb 4.4 oz), SpO2 93 %. No results found.  Recent Labs  12/11/15 0926 12/12/15 0225  WBC 18.2* 19.0*  HGB 9.7* 9.9*  HCT 31.3* 32.0*  PLT 382 389    Recent Labs  12/11/15 0926 12/12/15 0225  NA 139 138  K 4.1 4.3  CL 104 103  GLUCOSE 110* 100*  BUN 24* 30*  CREATININE 0.85 0.89  CALCIUM 8.1* 8.0*   CBG (last 3)  No results for input(s): GLUCAP in the last 72 hours.  Wt Readings from Last 3 Encounters:  12/12/15 60 kg (132 lb 4.4 oz)  12/06/15 61.7 kg (136 lb)  11/24/15 63.8 kg (140 lb 12 oz)    Physical Exam:  BP (!) 104/58 (BP Location: Left Arm)   Pulse 78   Temp 97.8 F (36.6 C) (Oral)   Resp 18   Ht 5\' 3"  (1.6 m)   Wt 60 kg (132 lb 4.4 oz)   SpO2 93%   BMI 23.43 kg/m  Constitutional: He appears cachectic. NAD. HENT: Normocephalicand atraumatic.  Eyes: EOMI. No discharge.  Cardiovascular: Normal rateand regular rhythm. No JVD. Respiratory: +[place]. SOB with minimal exertion. Normaland breath sounds normal. No stridor. No respiratory distress. He has no wheezes.  GI: Soft. Bowel sounds are normal. He exhibits no distension.  Musculoskeletal: He exhibits no edemaor tenderness.  Neurological: He is alertand oriented.  Motor: 4+/5 throughout (unchanged) Skin: Skin is warmand dry.  Not diaphoretic.  Assessment/Plan: 1. Functional deficits secondary to debility from respiratory distress which require 3+ hours per day of interdisciplinary therapy in a comprehensive inpatient rehab setting. Physiatrist is providing  close team supervision and 24 hour management of active medical problems listed below. Physiatrist and rehab team continue to assess barriers to discharge/monitor patient progress toward functional and medical goals.  Function:  Bathing Bathing position   Position: Shower  Bathing parts Body parts bathed by patient: Left arm, Right arm, Chest, Abdomen, Front perineal area, Buttocks, Right upper leg, Left upper leg Body parts bathed by helper: Right lower leg, Left lower leg, Back  Bathing assist Assist Level: Touching or steadying assistance(Pt > 75%)      Upper Body Dressing/Undressing Upper body dressing   What is the patient wearing?: Pull over shirt/dress     Pull over shirt/dress - Perfomed by patient: Thread/unthread right sleeve, Thread/unthread left sleeve, Put head through opening, Pull shirt over trunk          Upper body assist Assist Level: Set up      Lower Body Dressing/Undressing Lower body dressing   What is the patient wearing?: Underwear, Pants, Shoes Underwear - Performed by patient: Thread/unthread right underwear leg, Thread/unthread left underwear leg, Pull underwear up/down   Pants- Performed by patient: Thread/unthread right pants leg, Pull pants up/down, Thread/unthread left pants leg           Shoes - Performed by patient: Don/doff left shoe, Don/doff right shoe            Lower body assist  Assist for lower body dressing: Touching or steadying assistance (Pt > 75%)      Toileting Toileting   Toileting steps completed by patient: Performs perineal hygiene, Adjust clothing prior to toileting, Adjust clothing after toileting   Toileting Assistive Devices: Grab bar or rail  Toileting assist Assist level: Supervision or verbal cues, Set up/obtain supplies   Transfers Chair/bed transfer   Chair/bed transfer method: Squat pivot, Ambulatory Chair/bed transfer assist level: Supervision or verbal cues Chair/bed transfer assistive device:  Patent attorney     Max distance: 150 ft Assist level: Supervision or verbal cues   Wheelchair       Assist Level: Dependent (Pt equals 0%)  Cognition Comprehension Comprehension assist level: Understands complex 90% of the time/cues 10% of the time  Expression Expression assist level: Expresses complex 90% of the time/cues < 10% of the time  Social Interaction Social Interaction assist level: Interacts appropriately with others with medication or extra time (anti-anxiety, antidepressant).  Problem Solving Problem solving assist level: Solves complex problems: With extra time  Memory Memory assist level: Recognizes or recalls 90% of the time/requires cueing < 10% of the time     Medical Problem List and Plan: 1.  Weakness and poor activity tolerance secondary to debility from respiratory distress.  Cont CIR  Pt needs to very gradually progress with activity due to severely limited tolerance due to SOB, CP, Desats 2.  DVT Prophylaxis/Anticoagulation: Pharmaceutical: Other (comment)--Eliquis. H/o GIB in the past. Monitor for H/H.  3. Pain Management: Ultram prn. Local measures with heat/ice prn 4. Mood: Anxiety resolved. LCSW to follow for evaluation and support.  5. Neuropsych: This patient is capable of making decisions on his own behalf. 6. Skin/Wound Care: Maintain adequate nutritional and hydration status. Routine pressure relief measures 7. Fluids/Electrolytes/Nutrition: Monitor I/O.  8. Amiodarone toxicity with respiratory failure: Needs to maintain saturation over 92%--on prednisone 60 mg daily since 10/30.             Taper by 10 mg every 2 weeks with outpatient f/u.   Recs for O2 supplementation per Pulm.  Appreciate recs  Nebs scheduled, will need to monitor HR 9. VT/VF s/p AICD: On Mexitil and Ranexa 10. CAD with chronic systolic CHF with severe LVD: Cath 09/28/15 showed 2 vessel obstructive disease--unchanged from 2004. Unable to tolerate statins -- On  Ranexa, Coreg and Cozaar. Lasix on hold. 11.  Leucocytosis:   Secondary to steroids, afebrile  WBCs 19.0 on 11/7  Cont to monitor  UA slightly+, Ucx NG, abx d/ced  CXR reviewed 10/31, ?PNA vs edema - cont to monitor, will consider repeat 12  Anemia:   Hb 9.9 on 11/7  Cont to monitor 13. Diarrhea: Resolved  Cont to monitor  But chronic loose stools after cholecystectomy, immodium started after BMs (home reg) 14. H/o AFib: In NSR. On eliquis --monitor off amiodarone. On Mexitil every 8 hours.  15. Hypoalbuminemia  Supplement started 11/2  LOS (Days) 6 A FACE TO FACE EVALUATION WAS PERFORMED  Yui Mulvaney Karis Juba 12/12/2015 9:05 AM

## 2015-12-12 NOTE — Consult Note (Signed)
  INITIAL DIAGNOSTIC EVALUATION - [facility]   MEDICAL NECESSITY:  Shaiden Jory was seen on the [facility] for an initial diagnostic evaluation owing to the patient's current medical circumstances.    Records indicate that Mr. Yuuki Smigielski is a "70 y.o. male with history of CAD, A Fib, ICM s/p AICD, COPD, anxiety disorder, GIB, multiple hospitalizations for HCAP in the past few months --most recent for HCAP requiring discharge to home on oxygen. He was readmitted on 11/28/15 with ongoing hypoxia and progressive diffuse bilateral infiltrates."   During today's visit, Mr. Sane denied experiencing any cognitive issues. Emotionally, he has been in generally good spirits despite the multiple hospitalizations of late. However, he is anxious to discharge home and return to his normal routine. He has no history of being diagnosed with or treated for any mental health disorder. He denied ever experiencing any prolonged periods of depression or anxiety throughout his life. No adjustment issues endorsed. Suicidal/homicidal ideation, plan or intent was denied. No manic or hypomanic episodes were reported. The patient denied ever experiencing any auditory/visual hallucinations. No major behavioral or personality changes were endorsed.   Mr. Hulme feels that he has been making progress in therapy; no barriers identified. He described the rehab staff as "great." He has his wife to support him throughout this endeavor. She has fortunately been able to visit.   PROCEDURES: [1 unit 90791] Diagnostic clinical interview  Review of available records   IMPRESSION: Mr. Lucky denied experiencing any cognitive deficits and no overt issues were observed. However, he has a history of chronic hypoxia which could cause subtle deficits. If his care providers are concerned, we could perform a brief evaluation during his stay or set something up after  discharge. I see no signs of significant clinical psychopathology and he seems to be adjusting well to his present medical circumstances. As such, I do not feel that neuropsychology needs to follow-up with Mr. Krein unless requested by the patient or staff, though he was encouraged to call upon [place] if required.     Debbe Mounts, Psy.D., ABN Board-Certified Clinical Neuropsychologist

## 2015-12-12 NOTE — Progress Notes (Signed)
Occupational Therapy Session Note  Patient Details  Name: [name] MRN: [id number] Date of Birth: 1945-06-27  Today's Date: 12/12/2015  Session 1  OT Individual Time: 3212-2482 OT Individual Time Calculation (min): 75 min   Session 2 OT Individual Time: 1320-1415 OT Individual Time Calculation (min): 55 min    Short Term Goals: Week 1:  OT Short Term Goal 1 (Week 1): Pt will complete shower transfers with close S. OT Short Term Goal 2 (Week 1): Pt will be able to tolerate standing for 2 minutes with O2 sats >90. OT Short Term Goal 3 (Week 1): Pt will don pants with close S.  Skilled Therapeutic Interventions/Progress Updates:    Session 1 1:1 OT session focused on activity tolerance, O2 needs, and ADL modifications. Pt on 6L at rest when OT arrived - SpO2 92. Pt came to sitting EOB, pt bumped to 10L, SpO2 dropped to 75% when standing to pull pants over hips. Pt returned to sitting EOB, and with deep breathing techniques and rest, SpO2 recovered to 91. Pt completed grooming at the sink on 10L, then quickly needed to use BSC. Pt w/ loose stool on BSC and SpO2 80. Pt completed toileting with set-up then returned to bed for long rest break in supine. SpO2 recovered to 95 on 10L after ADL activity. Pt completed bed level there-ex using orange thera-band - 2 sets x 10 triceps press and bicep curls. SpO2 remained above 91.  Pt then came to EOB and completed 10 knee extensions- SpO2 above 87, returned to 90 with seated rest. Pt completed 3 sit<>stands, then returned to supine. SpO2 68 after 3 sit<>stands, recovered to 94 after 2 minutes of rest in supine.   Session 2 OT session COTX with rec therapy with focus on respiratory endurance/atcivity tolerance. Pt reported fatigue, and agreeable to OT participation after 20 minutes of rest. OT returned, pt on 10L of O2 at rest, SpO2 at 95. Pt came to sitting EOB, took seated rest break, then completed stand-step turn transfer to w/c. SpO2 dropped  with activity, but remained at or above 89. Pt ambulated 40 feet total w/ RW and multiple rest breaks in sitting and standing. Awaited SpO2 to recover <90 prior to continuing activity.  5 ft, standing rest break- SpO2 87  10 feet, seated rest break - SpO2 83  5 feet, seated rest break - SpO2 86 15 feet return to room w/o rest - SpO2 84 Pt recovered to 93%, then weaned to 8L O2 at rest, SpO2 maintained at 94% supine in bed with spouse present.  Therapy Documentation Precautions:  Precautions Precautions: Fall Precaution Comments: high flow O2 Restrictions Weight Bearing Restrictions: No General: General OT Amount of Missed Time: 20 Minutes OT Missed Treatment Reason: Patient fatigue Pain: Pain Assessment Pain Assessment: No/denies pain ADL: ADL ADL Comments: refer to functional navigator  See Function Navigator for Current Functional Status.   Therapy/Group: Individual Therapy  Mal Amabile 12/12/2015, 4:27 PM

## 2015-12-12 NOTE — Evaluation (Signed)
Recreational Therapy Assessment and Plan  Patient Details  Name: [name] MRN: [id number] Date of Birth: 1945-06-29 Today's Date: 12/12/2015  Rehab Potential: Good ELOS: 7 days   Assessment Problem List: Patient Active Problem List   Diagnosis Date Noted  . Physical debility 12/06/2015  . Amiodarone toxicity   . Anemia of chronic disease   . Anxiety about health   . Chronic systolic CHF (congestive heart failure) ([name])   . Coronary artery disease involving native coronary artery of native heart without angina pectoris   . Diarrhea   . History of GI bleed   . History of ventricular fibrillation   . Lymphocytosis   . PAF (paroxysmal atrial fibrillation) ([name])   . Respiratory distress   . S/P implantation of automatic cardioverter/defibrillator (AICD)   . Supplemental oxygen dependent   . Acute hypoxemic respiratory failure ([place])   . Malnutrition of moderate degree 11/29/2015  . Opacity of lung on imaging study   . Physical deconditioning 11/28/2015  . Transaminitis 11/28/2015  . Anemia 11/28/2015  . Elevated troponin   . Hypoxia   . AICD (automatic cardioverter/defibrillator) present 11/08/2015  . HCAP (healthcare-associated pneumonia) 11/07/2015  . Acute on chronic respiratory failure with hypoxia ([name]) 11/07/2015  . Fall at home 10/11/2015  . VT (ventricular tachycardia) ([name]) 09/27/2015  . Memory loss 09/24/2015  . Encounter for screening examination for infectious disease 09/24/2015  . Chronic atrial fibrillation ([name]) 05/28/2015  . History of ventricular tachycardia 05/25/2015  . Syncope 05/12/2015  . Bradycardia 05/12/2015  . Acute non-recurrent maxillary sinusitis 03/23/2015  . Advance care planning 09/16/2014  . Muscle ache 09/16/2014  . Back pain 09/08/2013  . Abdominal pain, chronic, epigastric 09/08/2013  . Fatigue 06/11/2013  . Chest wall mass 11/05/2012  . Arthritis   . Insomnia   . Anxiety   . Diverticulosis 12/11/2011  .  Tobacco abuse 03/06/2011  . Ischemic cardiomyopathy 02/12/2011  . Acute on chronic systolic CHF (congestive heart failure) ([name]) 05/10/2010  . Coronary atherosclerosis 10/31/2008  . Hyperlipidemia 08/25/2008  . ALLERGIC RHINITIS 08/25/2008  . Amiodarone pulmonary toxicity 08/25/2008    Past Medical History:      Past Medical History:  Diagnosis Date  . Acute lower GI bleeding 12/11/2011   "first time" (12/11/2011)  . Acute myocardial infarction, unspecified site, episode of care unspecified 1995   Pt living in [place], no stent, ?PTCA  . AICD (automatic cardioverter/defibrillator) present   . Allergic rhinitis, cause unspecified   . Anxiety   . Arthritis    "all over" (11/07/2015)  . Atrial fibrillation ([place])    a. 05/2015 - converted to sinus in setting of ICD shocks; placed on eliquis 5 bid.  [name] CAD (coronary artery disease), autologous vein bypass graft   . Cervical herniated disc    told not to lift >10 lbs  . Chronic systolic CHF (congestive heart failure), NYHA class 2 (HCC)    Reports EF of 25%.   [name] COPD (chronic obstructive pulmonary disease) ([name]) 11/2012   by xray  . Diverticulosis    by CT scan  . HCAP (healthcare-associated pneumonia) 11/06/2015  . Hypertension   . Insomnia   . Paroxysmal ventricular tachycardia ([name])   . Perennial allergic rhinitis    only to dust mites  . Pneumonia 2000s   "walking pneumonia"  . VT (ventricular tachycardia) ([name])    a. 05/2015 - VT storm with multiple ICD shocks-->Amio 400 BID.   Past Surgical History:  Past Surgical History:  Procedure Laterality Date  . CARDIAC CATHETERIZATION  2004   LAD 30%, D1 30%, CFX-AV groove 70-80%, OM1 30%, EF 20-25%  . CARDIAC CATHETERIZATION [name] 09/28/2015   Procedure: Left Heart Cath and Coronary Angiography;  Surgeon: Randy M Martinique, MD;  Location: [facility];  Service: Cardiovascular;  Laterality: [name];  . CARDIAC DEFIBRILLATOR PLACEMENT  2004  .  CATARACT EXTRACTION W/ INTRAOCULAR LENS IMPLANT Right 01/2012  . COLONOSCOPY  01/08/2012   Procedure: COLONOSCOPY;  Surgeon: Randy Mayer, MD;  Location: [facility];  Service: Endoscopy;  Laterality: [name];  . CORONARY ANGIOPLASTY  1995   Pt thinks he got a balloon, living in [name], [name] 02/07/2012   Procedure: IMPLANTABLE CARDIOVERTER DEFIBRILLATOR GENERATOR CHANGE;  Surgeon: Randy Lance, MD; Medtronic Evera XT VR single-chamber serial number [id number], Laterality: Left  . INSERT / REPLACE / REMOVE PACEMAKER  2004   Medtronic ICD  . KNEE ARTHROSCOPY Left 05/2003   [name] 06/19/2010  . LAPAROSCOPIC CHOLECYSTECTOMY  1/ 2012  . SHOULDER ARTHROSCOPY W/ ROTATOR CUFF REPAIR Right twice  . TONSILLECTOMY AND ADENOIDECTOMY  ~ 1951    Assessment & Plan Clinical Impression: Randy Lenker Bosleyis a 70 y.o.malewith history of CAD, A Fib, ICM s/p AICD, COPD, anxiety disorder, [name], multiple hospitalizations for HCAP in the past few months --most recent for HCAP requiring discharge to home on oxygen. He was readmitted on 11/28/15 with ongoing hypoxia and progressive diffuse bilateral infiltrates. [name] felt that infiltrates inflammatory in nature ---question vascular v/s amiodarone toxicity--RA and anti-CCP weakly positive. Solumedrol added and amiodarone discontinued. He developed VT which accelerated to VF with appropriate ICD therapy. His device was reprogrammed and he was started on Mexiletine for VT suppression. He continued to require high flow oxygen due to persistent hypoxemia and was started on BIPAP at nights. He was placed on broad spectrum antibiotics due to possibility of PNA--antibiotics discontinued 10/28 and PCCM recommends monitoring for now. Has improved with high dose steroids and [name] recommends tapering by 10 mg every 2 weeks with outpatient f/u. To continue to wean Fio2 for saturation >92% for  amiodarone toxicity and will need oxymizer to support oxygen needs. Respiratory failure highly suspicious for amiodarone toxicity but will need repeat serologies to rule out auto-immune etiology. He has been weaned down to 5 liters at rest and requires 8-10 liters with activity as desaturates to low 70s but recovers with rest. No recurrent VT on Mexiletine and Ranolazine and in SR but may have recurrence of A fib as amiodarone washes out per Dr. Lovena Le. Anxiety has resolved with improvement in activity tolerance.  Patient transferred to CIR on 12/06/2015.  Pt presents with decreased activity tolerance, decreased oxygen support & decreased functional mobility Limiting pt's independence with leisure/community pursuits.   Leisure History/Participation Premorbid leisure interest/current participation: [place] - Fishing;Nature - Other (Comment);Community - Doctor, hospital - Travel (Comment);Herbalist Leisure Participation Style: With Family/Friends Awareness of Community Resources: Good-identify 3 post discharge leisure resources Psychosocial / Spiritual Does patient have pets?: No Social interaction - Mood/Behavior: Cooperative Engineer, drilling for Education?: Yes Strengths/Weaknesses Patient Strengths/Abilities: Willingness to participate;Active premorbidly Patient weaknesses: Physical limitations TR Patient demonstrates impairments in the following area(s): Endurance  Plan Rec Therapy Plan Is patient appropriate for Therapeutic Recreation?: Yes Rehab Potential: Good Treatment times per week: Min 1 time per week >20 minutes Estimated Length of Stay: 7 days TR Treatment/Interventions: Adaptive equipment instruction;1:1  session;Balance/vestibular training;Community reintegration;Functional mobility training;Patient/family education;Therapeutic activities;Therapeutic exercise  Recommendations for other services: None  Discharge Criteria: Patient will  be discharged from TR if patient refuses treatment 3 consecutive times without medical reason.  If treatment goals not met, if there is a change in medical status, if patient makes no progress towards goals or if patient is discharged from hospital.  The above assessment, treatment plan, treatment alternatives and goals were discussed and mutually agreed upon: by patient  Wayne 12/12/2015, 3:03 PM

## 2015-12-12 NOTE — Progress Notes (Signed)
Physical Therapy Note  Patient Details  Name: [name] MRN: [id number] Date of Birth: 1945/05/14 Today's Date: 12/12/2015  Attempted to see patient for makeup session but patient declined due to fatigue. Will f/u per POC.    [name] 12/12/2015, 2:53 PM

## 2015-12-12 NOTE — Plan of Care (Signed)
Pt's plan of care adjusted to 15/7 after speaking with treatment team and discussed with MD as pt unable to tolerate current therapy schedule with OT and PT.

## 2015-12-12 NOTE — Progress Notes (Signed)
Physical Therapy Session Note  Patient Details  Name: [name] MRN: [id number] Date of Birth: [birth year]  Today's Date: 12/12/2015 PT Individual Time: 1030-1100 PT Individual Time Calculation (min): 30 min   Short Term Goals: Week 1:  PT Short Term Goal 1 (Week 1): Patient will ambulate 50 ft using LRAD with close supervision and min cues for safety.  PT Short Term Goal 2 (Week 1): Patient will perform transfers with supervision and min cues for safety.  PT Short Term Goal 3 (Week 1): Patient will negotiate up/down 13 stairs using 2 rails with min A.  PT Short Term Goal 4 (Week 1): Patient will propel wheelchair x 50 ft for strengthening and endurance.   Skilled Therapeutic Interventions/Progress Updates:    Patient in bed upon arrival, requesting time to rest before therapy, reporting no sleep last night and increased fatigue this date after yesterday. Removed rollator from room as patient requires max cues to slow down and pace himself and rollator increases patient speed, patient verbalized understanding. Returned after 30 min rest and patient agreeable to OOB activity. Patient sat EOB with mod I and performed stand pivot transfer to wheelchair with supervision with Sp02 dropping to 78% on 10 L 02 via [place] requiring approx 5 min to return to >90%. Session focused on upright tolerance sitting OOB in wheelchair x 25 min while discussing energy conservation techniques, patient goals, and home setup. Patient left sitting in wheelchair with needs in reach on 10 L 02 via [place] after notifying nurse tech of patient request to change sheets before returning to bed.  .  Therapy Documentation Precautions:  Precautions Precautions: Fall Precaution Comments: high flow O2 Restrictions Weight Bearing Restrictions: No General: PT Amount of Missed Time (min): 30 Minutes PT Missed Treatment Reason: Patient fatigue Vital Signs: Therapy Vitals Temp: 97.7 F (36.5 C) Temp Source: Oral Pulse Rate:  73 Resp: 18 BP: 112/66 Patient Position (if appropriate): Lying Oxygen Therapy SpO2: 92 % O2 Device: Nasal Cannula O2 Flow Rate (L/min): 10 L/min Pain: Pain Assessment Pain Assessment: No/denies pain   See Function Navigator for Current Functional Status.   Therapy/Group: Individual Therapy  Kerney Elbe 12/12/2015, 2:00 PM

## 2015-12-12 NOTE — Progress Notes (Signed)
O2 humidity provided in the salter bottle HFNC

## 2015-12-13 ENCOUNTER — Inpatient Hospital Stay (HOSPITAL_COMMUNITY): Payer: Medicare Other | Admitting: Occupational Therapy

## 2015-12-13 ENCOUNTER — Inpatient Hospital Stay (HOSPITAL_COMMUNITY): Payer: Medicare Other

## 2015-12-13 ENCOUNTER — Inpatient Hospital Stay (HOSPITAL_COMMUNITY): Payer: Medicare Other | Admitting: Physical Therapy

## 2015-12-13 DIAGNOSIS — Z9049 Acquired absence of other specified parts of digestive tract: Secondary | ICD-10-CM

## 2015-12-13 DIAGNOSIS — J9601 Acute respiratory failure with hypoxia: Secondary | ICD-10-CM

## 2015-12-13 DIAGNOSIS — I25119 Atherosclerotic heart disease of native coronary artery with unspecified angina pectoris: Secondary | ICD-10-CM

## 2015-12-13 DIAGNOSIS — J810 Acute pulmonary edema: Secondary | ICD-10-CM

## 2015-12-13 DIAGNOSIS — J42 Unspecified chronic bronchitis: Secondary | ICD-10-CM

## 2015-12-13 DIAGNOSIS — I200 Unstable angina: Secondary | ICD-10-CM

## 2015-12-13 LAB — BASIC METABOLIC PANEL
ANION GAP: 9 (ref 5–15)
BUN: 26 mg/dL — ABNORMAL HIGH (ref 6–20)
CO2: 25 mmol/L (ref 22–32)
Calcium: 7.9 mg/dL — ABNORMAL LOW (ref 8.9–10.3)
Chloride: 103 mmol/L (ref 101–111)
Creatinine, Ser: 0.87 mg/dL (ref 0.61–1.24)
GFR calc Af Amer: >60 mL/min (ref >60)
GLUCOSE: 132 mg/dL — AB (ref 65–99)
POTASSIUM: 4.2 mmol/L (ref 3.5–5.1)
Sodium: 137 mmol/L (ref 135–145)

## 2015-12-13 LAB — MAGNESIUM: MAGNESIUM: 1.9 mg/dL (ref 1.7–2.4)

## 2015-12-13 LAB — TROPONIN I
TROPONIN I: 0.04 ng/mL — AB (ref <0.03)
Troponin I: 0.04 ng/mL (ref <0.03)

## 2015-12-13 MED ORDER — ALPRAZOLAM 0.25 MG PO TABS
0.2500 mg | ORAL_TABLET | Freq: Three times a day (TID) | ORAL | Status: DC | PRN
  Administered 2015-12-13: 0.25 mg via ORAL
  Filled 2015-12-13: qty 1

## 2015-12-13 MED ORDER — LEVALBUTEROL HCL 1.25 MG/0.5ML IN NEBU
1.2500 mg | INHALATION_SOLUTION | Freq: Four times a day (QID) | RESPIRATORY_TRACT | Status: DC | PRN
  Filled 2015-12-13: qty 0.5

## 2015-12-13 MED ORDER — FUROSEMIDE 10 MG/ML IJ SOLN
40.0000 mg | INTRAMUSCULAR | Status: AC
  Administered 2015-12-13 (×2): 40 mg via INTRAVENOUS
  Filled 2015-12-13 (×2): qty 4

## 2015-12-13 MED ORDER — LEVALBUTEROL HCL 0.63 MG/3ML IN NEBU: 0.6300 mg | INHALATION_SOLUTION | Freq: Four times a day (QID) | RESPIRATORY_TRACT | Status: DC | PRN

## 2015-12-13 MED ORDER — LEVALBUTEROL HCL 1.25 MG/0.5ML IN NEBU
1.2500 mg | INHALATION_SOLUTION | Freq: Three times a day (TID) | RESPIRATORY_TRACT | Status: DC
  Administered 2015-12-13: 1.25 mg via RESPIRATORY_TRACT
  Filled 2015-12-13: qty 0.5

## 2015-12-13 NOTE — Progress Notes (Signed)
Recreational Therapy Discharge Summary Patient Details  Name: [name] MRN: [id number] Date of Birth: [birth year] Today's Date: 12/13/2015  Long term goals set: 1  Long term goals met: 0  Comments on progress toward goals: Goal discontinued as pt with low activity tolerance.  No further TR.  Will continue to monitor through team.  Reasons for discharge: Low activity tolerance/poor oxygen support  Patient/family agrees with progress made and goals achieved: Yes  Bekim Werntz 12/13/2015, 4:19 PM

## 2015-12-13 NOTE — Progress Notes (Signed)
Social Work Patient ID: [name], male   DOB: December 13, 1945, 70 y.o.   MRN: [id number]  Met with pt to discuss team conference supervision level goals and are target discharge 11/11. But main issue sis his medical Stability and he is aware Pulm MD have ordered a chest CT for tomorrow and may get cardiology involved. He hopes to go home Sat he feels he can do better at home and rest better. His wife can provide supervision Level. Will wait and see what MD reports and work on discharge needs.

## 2015-12-13 NOTE — Plan of Care (Signed)
Problem: RH Leisure Awareness Goal: LTG: Patient will participate in leisure activities (TR) LTG: Patient will participate in leisure activities (simple/moderate/difficult) to increase ability to functionally perform activity, identify and utilize resources, identify new leisure interests, utilize adaptive equipment at specific level  (TR)  Outcome: Not Applicable Date Met: 16/01/09 Goal discontinued.  Pt with low activity tolerance.

## 2015-12-13 NOTE — Plan of Care (Signed)
Problem: RH Wheelchair Mobility Goal: LTG Patient will propel w/c in controlled environment (PT) LTG: Patient will propel wheelchair in controlled environment, # of feet with assist (PT)  Outcome: Not Applicable Date Met: 79/03/83 Patient household ambulator.

## 2015-12-13 NOTE — Consult Note (Signed)
Name: [name] MRN: [id number] DOB: 09/12/1945    ADMISSION DATE:  12/06/2015 CONSULTATION DATE: 11/8  REFERRING MD : Rehab  CHIEF COMPLAINT:  SOB  BRIEF PATIENT DESCRIPTION: Frail thin WM  SIGNIFICANT EVENTS    STUDIES:     HISTORY OF PRESENT ILLNESS:   70 yo with PAF/CAD/AICD/EF 30% recent treatment x 2 for pna in October 2017 with 2 admissions. Amiodarone toxicty stopped in October. Admitted to [facility] for AICD firing x 2 on 10/24 along with FTT. He was discharged 11/1 to rehab for being frail ans weak. On 11/7 he was more sob and he has O2 increased to 8 l/m. PCCM asked to evaluate on 11/8.   PAST MEDICAL HISTORY :   has a past medical history of Acute lower GI bleeding (12/11/2011); Acute myocardial infarction, unspecified site, episode of care unspecified (1995); AICD (automatic cardioverter/defibrillator) present; Allergic rhinitis, cause unspecified; Anxiety; Arthritis; Atrial fibrillation (HCC); CAD (coronary artery disease), autologous vein bypass graft; Cervical herniated disc; Chronic systolic CHF (congestive heart failure), NYHA class 2 (HCC); COPD (chronic obstructive pulmonary disease) (HCC) (11/2012); Diverticulosis; HCAP (healthcare-associated pneumonia) (11/06/2015); Hypertension; Insomnia; Paroxysmal ventricular tachycardia (HCC); Perennial allergic rhinitis; Pneumonia (2000s); and VT (ventricular tachycardia) (HCC).  has a past surgical history that includes Cardiac defibrillator placement (2004); Tonsillectomy and adenoidectomy (~ 1951); Shoulder arthroscopy w/ rotator cuff repair (Right, twice); Insert / replace / remove pacemaker (2004); Colonoscopy (01/08/2012); implantable cardioverter defibrillator generator change (N/A, 02/07/2012); Laparoscopic cholecystectomy (1/ 2012); Coronary angioplasty (1995); Cardiac catheterization (2004); Cardiac catheterization (N/A, 09/28/2015); Cataract extraction w/ intraocular lens implant (Right, 01/2012); and Knee arthroscopy  (Left, 05/2003). Prior to Admission medications   Medication Sig Start Date End Date Taking? Authorizing Provider  acetaminophen (TYLENOL) 325 MG tablet Take 2 tablets (650 mg total) by mouth every 6 (six) hours as needed for mild pain (or Fever >/= 101). 12/06/15  Yes Joseph Art, DO  apixaban (ELIQUIS) 5 MG TABS tablet Take 1 tablet (5 mg total) by mouth 2 (two) times daily. Resume 09/29/15 09/28/15  Yes Amber Caryl Bis, NP  aspirin 81 MG EC tablet Take 1 tablet (81 mg total) by mouth daily. 05/28/15  Yes Ok Anis, NP  benzonatate (TESSALON) 200 MG capsule Take 1 capsule (200 mg total) by mouth every 4 (four) hours as needed for cough. 12/06/15  Yes Joseph Art, DO  carvedilol (COREG) 3.125 MG tablet Take 1 tablet (3.125 mg total) by mouth 2 (two) times daily with a meal. 12/06/15  Yes Jessica U Vann, DO  diazepam (VALIUM) 5 MG tablet Take 1 tablet (5 mg total) by mouth every 6 (six) hours as needed for anxiety. 12/06/15  Yes Jessica U Vann, DO  donepezil (ARICEPT) 10 MG tablet Take 1 tablet (10 mg total) by mouth at bedtime. 11/01/15  Yes Joaquim Nam, MD  feeding supplement (BOOST / RESOURCE BREEZE) LIQD Take 1 Container by mouth 3 (three) times daily between meals. 12/06/15  Yes Jessica U Vann, DO  fexofenadine-pseudoephedrine (ALLEGRA-D 24) 180-240 MG 24 hr tablet Take 1 tablet by mouth daily.   Yes Historical Provider, MD  guaiFENesin (MUCINEX) 600 MG 12 hr tablet Take by mouth 2 (two) times daily.   Yes Historical Provider, MD  HYDROcodone-acetaminophen (NORCO/VICODIN) 5-325 MG tablet TAKE 1 TABLET BY MOUTH EVERY 6 HOURS AS NEEDED FOR PAIN Patient taking differently: Take 1 tablet by mouth every 6 (six) hours as needed for moderate pain.  10/10/15  Yes Joaquim Nam, MD  levalbuterol (  XOPENEX) 1.25 MG/0.5ML nebulizer solution Take 1.25 mg by nebulization every 6 (six) hours as needed for wheezing or shortness of breath. 11/21/15  Yes Dorothea Ogle, MD  loperamide (IMODIUM) 2 MG  capsule Take 1 capsule (2 mg total) by mouth as needed for diarrhea or loose stools. 11/21/15  Yes Dorothea Ogle, MD  losartan (COZAAR) 25 MG tablet Take 0.5 tablets (12.5 mg total) by mouth daily. 12/07/15  Yes Jessica U Vann, DO  metaxalone (SKELAXIN) 800 MG tablet TAKE 1 TABLET BY MOUTH 3 TIMES A DAY AS NEEDED FOR PAIN. Patient taking differently: Take 800 mg by mouth 3 (three) times daily as needed for muscle spasms.  10/10/15  Yes Joaquim Nam, MD  mexiletine (MEXITIL) 200 MG capsule Take 1 capsule (200 mg total) by mouth every 8 (eight) hours. 12/06/15  Yes Joseph Art, DO  Multiple Vitamins-Minerals (MULTIVITAMIN ADULTS 50+) TABS Take 1 tablet by mouth daily.   Yes Historical Provider, MD  nitroGLYCERIN (NITROSTAT) 0.4 MG SL tablet PLACE 1 TABLET (0.4 MG TOTAL) UNDER THE TONGUE EVERY 5 (FIVE) MINUTES AS NEEDED. FOR CHEST PAIN. 11/20/15  Yes Joaquim Nam, MD  predniSONE (DELTASONE) 20 MG tablet Take 3 tablets (60 mg total) by mouth daily with breakfast. 12/07/15  Yes Joseph Art, DO  promethazine (PHENERGAN) 25 MG tablet Take 0.5-1 tablets (12.5-25 mg total) by mouth every 8 (eight) hours as needed for nausea or vomiting. 11/14/15  Yes Joaquim Nam, MD  ranitidine (ZANTAC) 150 MG capsule Take 150 mg by mouth daily as needed for heartburn.    Yes Historical Provider, MD  ranolazine (RANEXA) 500 MG 12 hr tablet Take 1 tablet (500 mg total) by mouth 2 (two) times daily. 09/28/15  Yes Amber Caryl Bis, NP  traMADol (ULTRAM) 50 MG tablet TAKE 1 TO 2 TABLETS BY MOUTH EVERY 8 HOURS AS NEEDED Patient taking differently: TAKE 1 TO 2 TABLETS BY MOUTH EVERY 8 HOURS AS NEEDED FOR PAIN. 10/24/15  Yes Joaquim Nam, MD  zolpidem (AMBIEN) 5 MG tablet Take 1 tablet (5 mg total) by mouth at bedtime as needed for sleep. 12/06/15  Yes Joseph Art, DO   Allergies  Allergen Reactions  . Ace Inhibitors Other (See Comments)    muscle pain. Tolerates ARBs.   . Codeine Other (See Comments)    "head wants  to explode."  . Doxycycline Diarrhea and Nausea And Vomiting  . Penicillins Swelling    "started at point of injection; w/in 3 min my upper arm was swollen 3 times normal" Has patient had a PCN reaction causing immediate rash, facial/tongue/throat swelling, SOB or lightheadedness with hypotension: Yes Has patient had a PCN reaction causing severe rash involving mucus membranes or skin necrosis: No Has patient had a PCN reaction that required hospitalization No Has patient had a PCN reaction occurring within the last 10 years: No If all of the above answers are "NO", then may proceed wi  . Lisinopril Other (See Comments)    Muscle Pain  . Statins Other (See Comments)    Myalgias per patient    FAMILY HISTORY:  family history includes Cancer in his sister; Diabetes in his father; Stroke in his mother; Tracheal cancer (age of onset: 41) in his father. SOCIAL HISTORY:  reports that he quit smoking about 2 months ago. His smoking use included Cigarettes and Cigars. He has a 25.00 pack-year smoking history. He has never used smokeless tobacco. He reports that he does not drink  alcohol or use drugs.  REVIEW OF SYSTEMS:   10 point review of system taken, please see HPI for positives and negatives.  SUBJECTIVE:  NAD at rest  VITAL SIGNS: Temp:  [97.7 F (36.5 C)-98.8 F (37.1 C)] 98.8 F (37.1 C) (11/08 1006) Pulse Rate:  [73-91] 73 (11/08 1006) Resp:  [14-18] 14 (11/08 0534) BP: (112-128)/(66-74) 117/74 (11/08 1006) SpO2:  [83 %-93 %] 90 % (11/08 1006) Weight:  [136 lb 8 oz (61.9 kg)] 136 lb 8 oz (61.9 kg) (11/08 0500)  PHYSICAL EXAMINATION: General:  Frail, nervous male Neuro: Poor memory, follows commands HEENT:  No JVD/LAn Cardiovascular:  HSR Lungs:  Bibasilar crackles Abdomen: soft +_bs Musculoskeletal: muscle wasting Skin:  Warm and dry   Recent Labs Lab 12/11/15 0926 12/12/15 0225 12/12/15 2328  NA 139 138 137  K 4.1 4.3 4.2  CL 104 103 103  CO2 27 28 25   BUN 24*  30* 26*  CREATININE 0.85 0.89 0.87  GLUCOSE 110* 100* 132*    Recent Labs Lab 12/07/15 0500 12/11/15 0926 12/12/15 0225  HGB 9.4* 9.7* 9.9*  HCT 29.9* 31.3* 32.0*  WBC 13.8* 18.2* 19.0*  PLT 401* 382 389   Dg Chest 2 View  Result Date: 12/13/2015 CLINICAL DATA:  Short of breath since yesterday EXAM: CHEST  2 VIEW COMPARISON:  12/05/2015 FINDINGS: Left subclavian AICD device is stable. Cardiomegaly is stable. Bilateral airspace disease has improved throughout the left lung and is stable in the right lung. No pneumothorax. No pleural effusion. IMPRESSION: Bilateral airspace disease is improved on the left and stable on the right. These findings suggest improving CHF and pulmonary edema. Electronically Signed   By: Jolaine Click M.D.   On: 12/13/2015 11:17    ASSESSMENT  Active Problems:    Respiratory distress   Supplemental oxygen dependent   Physical debility   Amiodarone toxicity   Anemia of chronic disease   Chronic systolic CHF (congestive heart failure) (HCC)   Diarrhea   History of GI bleed   History of ventricular fibrillation   Lymphocytosis   PAF (paroxysmal atrial fibrillation) (HCC)    Hypoalbuminemia due to protein-calorie malnutrition (HCC)   Unstable angina pectoris (HCC)   Postcholecystectomy diarrhea   Coronary artery disease involving native coronary artery with angina pectoris [facility])   Discussion: 71 yo with PAF/CAD/AICD/EF 30% recent treatment x 2 for pna in October 2017 with 2 admissions. Amiodarone toxicty stopped in October. Admitted to [facility] for AICD firing x 2 on 10/24 along with FTT. He was discharged 11/1 to rehab for being frail ans weak. On 11/7 he was more sob and he has O2 increased to 8 l/m. PCCM asked to evaluate on 11/8.     PLAN: -Consider CT chest PE protocol to ro PE -O2 as needed -Decreased xopenex to proper dose. -Consider Cards to reevaluate . -Control anxiety -He may be end stage  [facility] ACNP Adolph Pollack PCCM Pager [phone]  till 3 pm If no answer page [phone]  Attending Note:  70 year old male with PMH of CHF and amiodarone toxicity who has been off amiodarone since October presenting with worsening hypoxemic respiratory failure.  On exam, crackles at the bases with no wheezing.  I reviewed CXR myself, evidence of cephalization noted.  Discussed with PCCM-NP.  Acute hypoxemic respiratory failure:  - Supplemental O2.  - Titrate O2 for sat of 88-92%.  Acute pulmonary edema:  - Lasix 40 mg IV q8 x2 doses.  - F/U BMET in  AM.  Pulmonary fibrosis: suspect due to previous amiodarone toxicity.  - Continue to hold amiodarone.  - No role for steroids at this time.  Obstructive lung disease:  - Adjust xopenex.  PCCM will f/u in AM, if with diureses O2 demand continues to be high then will likely need a CTA but hypoxemia for now is explainable by CXR.  Patient seen and examined, agree with above note.  I dictated the care and orders written for this patient under my direction.  Alyson ReedyWesam G Dilpreet Faires, MD [phone]  12/13/2015, 12:58 PM

## 2015-12-13 NOTE — Progress Notes (Signed)
Occupational Therapy Session Note  Patient Details  Name: [name] MRN: [id number] Date of Birth: December 03, 1945  Today's Date: 12/13/2015  Session 1 OT Individual Time: 1130-1203 OT Individual Time Calculation (min): 33 min  Session 2 OT Individual Time: 1430-1525 OT Individual Time Calculation (min): 55 min   Short Term Goals: Week 1:  OT Short Term Goal 1 (Week 1): Pt will complete shower transfers with close S. OT Short Term Goal 2 (Week 1): Pt will be able to tolerate standing for 2 minutes with O2 sats >90. OT Short Term Goal 3 (Week 1): Pt will don pants with close S.  Skilled Therapeutic Interventions/Progress Updates:    Session 1 Pt off the floor for chest x-ray for first 30 minutes of session. Pt agreeable to participate in OT after returning to room and report from RN that chest x-ray results showed improvement. On 10L of O2, pt came to sitting EOB, donned pants, and returned to sitting for rest break. Pt desat to 76 with ADL activity and reported chest tightness. Pt returned to supine; with rest and deep breathing  Techniques, recovered to 93. Pt came back to sitting EOB and worked on deep breathing techniques during transitional movements. SpO2 desat to 86- but recovered to 92 sitting EOB. Discussed ways to increase activity and pt agreeable to try to sit at EOB for meals. Pt completed side-scoots along EOB and returned to supine. OT discussed bed positioning for optimal lung expansion, pt/spouse verbalize understanding.   Session 2 1:1 OT session focused on standing endurance, activity tolerance, and pt/family education. Pt greeted supine in bed with spouse present. Pt reported he tolerated sitting EOB for lunch today. Discussed POC with pt/family, positive reinforcement, and energy conservation techniques. Pt on 10L of O2 throughout session via [place] . SpO2 96 at rest. Pt transferred to EOB, w/ seated rest break, SpO2 remained above 90 with transition.  * Stand-step turn  transfer to w/c + seated rest break. desat to 8- recovered quickly to >90.  * Ambulated 10 ft w/ RW and min cues to slow pace + seated rest break. -pt desat to 85- slightly delayed.Recovered to 90 after 2 minutes rest. *brought to therapy gym. 1 minute of standing activity w/ ball toss <>spouse.-desat 85, recovered slowly to >90.  RN administered IV lasix and pt reported need to urinate. Pt returned to room and utilized urinal seated EOB with spouse present.    Therapy Documentation Precautions:  Precautions Precautions: Fall Precaution Comments: high flow O2 Restrictions Weight Bearing Restrictions: No General: General OT Amount of Missed Time: 27 Minutes OT Missed Treatment Reason: Patient off the floor at x-ray Pain: Pain Assessment Pain Assessment: No/denies pain Pain Score: 0-No pain ADL: ADL ADL Comments: refer to functional navigator  See Function Navigator for Current Functional Status.   Therapy/Group: Individual Therapy  Mal Amabile 12/13/2015, 3:37 PM

## 2015-12-13 NOTE — Progress Notes (Signed)
Patient reports increased dyspnea with minimal exertion (standing to use urinal) this morning.  Oxygen saturation 80's on 8L HFNC.  Increased O2 to 12L HFNC and patients oxygen maintained at 90%.  IV inserted for emergency purposes.  New orders carried out for CXR and pulmonary consult.  Patient reports tightness with inhalation but no chest pain.  He denies any cough, says previous dry cough has resolved with nebulizer treatments.  Lungs clear, diminished.  Wife at bedside.  Will continue to monitor. Dani Gobble, RN

## 2015-12-13 NOTE — Progress Notes (Signed)
[facility]IC[facility]ABILITATION     PROGRESS NOTE  Subjective/Complaints:  Pt seen laying in bed.  He states he did not sleep well last night because he was anxious about the results of his blood draws.  Pt made 15/7 yesterday.    ROS: +DOE. Denies CP, N/V/D.  Objective: Vital Signs: Blood pressure 128/69, pulse 91, temperature 98.4 F (36.9 C), temperature source Oral, resp. rate 14, height 5\' 3"  (1.6 m), weight 61.9 kg (136 lb 8 oz), SpO2 (!) 83 %. No results found.  Recent Labs  12/11/15 0926 12/12/15 0225  WBC 18.2* 19.0*  HGB 9.7* 9.9*  HCT 31.3* 32.0*  PLT 382 389    Recent Labs  12/12/15 0225 12/12/15 2328  NA 138 137  K 4.3 4.2  CL 103 103  GLUCOSE 100* 132*  BUN 30* 26*  CREATININE 0.89 0.87  CALCIUM 8.0* 7.9*   CBG (last 3)  No results for input(s): GLUCAP in the last 72 hours.  Wt Readings from Last 3 Encounters:  12/13/15 61.9 kg (136 lb 8 oz)  12/06/15 61.7 kg (136 lb)  11/24/15 63.8 kg (140 lb 12 oz)    Physical Exam:  BP 128/69 (BP Location: Right Arm)   Pulse 91   Temp 98.4 F (36.9 C) (Oral)   Resp 14   Ht 5\' 3"  (1.6 m)   Wt 61.9 kg (136 lb 8 oz)   SpO2 (!) 83% Comment: increased tp 10 lpm- RN aware and encouraged to recheck  BMI 24.18 kg/m  Constitutional: He appears cachectic. NAD. HENT: Normocephalicand atraumatic.  Eyes: EOMI. No discharge.  Cardiovascular: Normal rateand regular rhythm. No JVD. Respiratory: +[place]. SOB with minimal exertion. Normaland breath sounds normal. No stridor. No respiratory distress. He has no wheezes.  GI: Soft. Bowel sounds are normal. He exhibits no distension.  Musculoskeletal: He exhibits no edemaor tenderness.  Neurological: He is alertand oriented.  Motor: 4+/5 throughout (stable) Skin: Skin is warmand dry.  Not diaphoretic.  Assessment/Plan: 1. Functional deficits secondary to debility from respiratory distress which require 3+ hours per day of interdisciplinary therapy in a  comprehensive inpatient rehab setting. Physiatrist is providing close team supervision and 24 hour management of active medical problems listed below. Physiatrist and rehab team continue to assess barriers to discharge/monitor patient progress toward functional and medical goals.  Function:  Bathing Bathing position   Position: Shower  Bathing parts Body parts bathed by patient: Left arm, Right arm, Chest, Abdomen, Front perineal area, Buttocks, Right upper leg, Left upper leg Body parts bathed by helper: Right lower leg, Left lower leg, Back  Bathing assist Assist Level: Touching or steadying assistance(Pt > 75%)      Upper Body Dressing/Undressing Upper body dressing   What is the patient wearing?: Pull over shirt/dress     Pull over shirt/dress - Perfomed by patient: Thread/unthread right sleeve, Thread/unthread left sleeve, Put head through opening, Pull shirt over trunk          Upper body assist Assist Level: Set up      Lower Body Dressing/Undressing Lower body dressing   What is the patient wearing?: Underwear, Pants, Shoes Underwear - Performed by patient: Thread/unthread right underwear leg, Thread/unthread left underwear leg, Pull underwear up/down   Pants- Performed by patient: Thread/unthread right pants leg, Pull pants up/down, Thread/unthread left pants leg           Shoes - Performed by patient: Don/doff left shoe, Don/doff right shoe  Lower body assist Assist for lower body dressing: Touching or steadying assistance (Pt > 75%)      Toileting Toileting   Toileting steps completed by patient: Adjust clothing prior to toileting, Performs perineal hygiene, Adjust clothing after toileting   Toileting Assistive Devices: Grab bar or rail  Toileting assist Assist level: Supervision or verbal cues   Transfers Chair/bed transfer   Chair/bed transfer method: Ambulatory Chair/bed transfer assist level: Supervision or verbal cues Chair/bed  transfer assistive device: Patent attorneyWalker     Locomotion Ambulation     Max distance: 150 ft Assist level: Supervision or verbal cues   Wheelchair       Assist Level: Dependent (Pt equals 0%)  Cognition Comprehension Comprehension assist level: Follows complex conversation/direction with extra time/assistive device  Expression Expression assist level: Expresses complex ideas: With extra time/assistive device  Social Interaction Social Interaction assist level: Interacts appropriately with others with medication or extra time (anti-anxiety, antidepressant).  Problem Solving Problem solving assist level: Solves complex problems: With extra time  Memory Memory assist level: Recognizes or recalls 90% of the time/requires cueing < 10% of the time     Medical Problem List and Plan: 1.  Weakness and poor activity tolerance secondary to debility from respiratory distress.  Cont CIR  Pt needs to very gradually progress with activity due to severely limited tolerance due to SOB, CP, Desats, therapies 15/7 2.  DVT Prophylaxis/Anticoagulation: Pharmaceutical: Other (comment)--Eliquis. H/o GIB in the past. Monitor for H/H.  3. Pain Management: Ultram prn. Local measures with heat/ice prn 4. Mood: Anxiety resolved. LCSW to follow for evaluation and support.  5. Neuropsych: This patient is capable of making decisions on his own behalf. 6. Skin/Wound Care: Maintain adequate nutritional and hydration status. Routine pressure relief measures 7. Fluids/Electrolytes/Nutrition: Monitor I/O.  8. Amiodarone toxicity with respiratory failure: Needs to maintain saturation over 92%--on prednisone 60 mg daily since 10/30.             Taper by 10 mg every 2 weeks with outpatient f/u.   Recs for O2 supplementation per Pulm.  Appreciate recs  Nebs scheduled, will need to monitor HR 9. VT/VF s/p AICD: On Mexitil and Ranexa  10. CAD with chronic systolic CHF with severe LVD: Cath 09/28/15 showed 2 vessel obstructive  disease--unchanged from 2004. Unable to tolerate statins -- On Ranexa, Coreg and Cozaar. Lasix on hold.  Chest pain with increased activity.  Troponins stable, occurs with increased activity and drop in Sats, therapy schedule changed 11.  Leucocytosis:   Secondary to steroids, afebrile  WBCs 19.0 on 11/7  Cont to monitor  UA slightly+, Ucx NG, abx d/ced  CXR reviewed 10/31, ?PNA vs edema - cont to monitor, will consider repeat  Labs ordered for tomorrow 12  Anemia:   Hb 9.9 on 11/7  Cont to monitor  Labs ordered for tomorrow 13. Diarrhea: Resolved  Cont to monitor  But chronic loose stools after cholecystectomy, immodium started after BMs (home reg) 14. H/o AFib: In NSR. On eliquis --monitor off amiodarone. On Mexitil every 8 hours.  15. Hypoalbuminemia  Supplement started 11/2   LOS (Days) 7 A FACE TO FACE EVALUATION WAS PERFORMED  Kayle Passarelli Karis Jubanil Marlen Mollica 12/13/2015 8:27 AM

## 2015-12-13 NOTE — Progress Notes (Addendum)
Physical Therapy Note  Patient Details  Name: [name] MRN: [id number] Date of Birth: 1945-11-29 Today's Date: 12/13/2015  Attempted to see patient for first therapy session of day. Patient on 10 L 02 via [place] in bed, reporting not feeling good this AM and c/o fatigue and shortwindedness from using urinal, requesting to skip this morning's therapy. Continued patient education on energy conservation and discharge planning. Patient missed 60 min skilled therapy due to refusal/fatigue. PA-C Pam Love notified.    [name] 12/13/2015, 9:17 AM

## 2015-12-13 NOTE — Progress Notes (Signed)
Patient reporting increase in dyspnea this am--SOB and he feels that he's "gasping for air"after using urinal and pain right lower chest with deep inhalation. No chest pain or palpation. Exam reveals NSR with HR 70 bpm.  Lungs with few crackles at bases R>L. Will check CXR. Will contact Pulmonary for input.

## 2015-12-13 NOTE — Progress Notes (Signed)
Social Work Lucy Chrisebecca G Reghan Thul, LCSW Social Worker Signed   Patient Care Conference Date of Service: 12/13/2015  4:55 PM      Hide copied text Hover for attribution information Inpatient RehabilitationTeam Conference and Plan of Care Update Date: 12/13/2015   Time: 2: 50 PM      Patient Name: Randy MinRichard L Harrower      Medical Record Number: [id number]  Date of Birth: 04/27/1945 Sex: Male         Room/Bed: 4W08C/4W08C-01 Payor Info: Payor: MEDICARE / Plan: MEDICARE PART A AND B / Product Type: *No Product type* /     Admitting Diagnosis: Debility  Admit Date/Time:  12/06/2015  4:30 PM Admission Comments: No comment available    Primary Diagnosis:  <principal problem not specified> Principal Problem: <principal problem not specified>       Patient Active Problem List    Diagnosis Date Noted  . Postcholecystectomy diarrhea 12/12/2015  . Coronary artery disease involving native coronary artery with angina pectoris (HCC) 12/12/2015  . Loose stools    . Unstable angina pectoris (HCC)    . Hypoalbuminemia due to protein-calorie malnutrition (HCC)    . Physical debility 12/06/2015  . Amiodarone toxicity    . Anemia of chronic disease    . Anxiety about health    . Chronic systolic CHF (congestive heart failure) (HCC)    . Coronary artery disease involving native coronary artery of native heart without angina pectoris    . Diarrhea    . History of GI bleed    . History of ventricular fibrillation    . Lymphocytosis    . PAF (paroxysmal atrial fibrillation) (HCC)    . Respiratory distress    . S/P implantation of automatic cardioverter/defibrillator (AICD)    . Supplemental oxygen dependent    . Acute hypoxemic respiratory failure (HCC)    . Malnutrition of moderate degree 11/29/2015  . Opacity of lung on imaging study    . Physical deconditioning 11/28/2015  . Transaminitis 11/28/2015  . Anemia 11/28/2015  . Elevated troponin    . Hypoxia    . AICD (automatic  cardioverter/defibrillator) present 11/08/2015  . HCAP (healthcare-associated pneumonia) 11/07/2015  . Acute respiratory failure with hypoxia (HCC) 11/07/2015  . Fall at home 10/11/2015  . VT (ventricular tachycardia) (HCC) 09/27/2015  . Memory loss 09/24/2015  . Encounter for screening examination for infectious disease 09/24/2015  . Chronic atrial fibrillation (HCC) 05/28/2015  . History of ventricular tachycardia 05/25/2015  . Syncope 05/12/2015  . Bradycardia 05/12/2015  . Acute non-recurrent maxillary sinusitis 03/23/2015  . Advance care planning 09/16/2014  . Muscle ache 09/16/2014  . Back pain 09/08/2013  . Abdominal pain, chronic, epigastric 09/08/2013  . Fatigue 06/11/2013  . Chest wall mass 11/05/2012  . Arthritis    . Insomnia    . Anxiety    . Diverticulosis 12/11/2011  . Tobacco abuse 03/06/2011  . Ischemic cardiomyopathy 02/12/2011  . Acute on chronic systolic CHF (congestive heart failure) (HCC) 05/10/2010  . Coronary atherosclerosis 10/31/2008  . Hyperlipidemia 08/25/2008  . ALLERGIC RHINITIS 08/25/2008  . Amiodarone pulmonary toxicity 08/25/2008      Expected Discharge Date: Expected Discharge Date: 12/16/15   Team Members Present: Physician leading conference: Dr. Maryla MorrowAnkit Patel Social Worker Present: Dossie DerBecky Ashlynn Gunnels, LCSW Nurse Present: Carmie EndAngie Joyce, RN PT Present: Bayard Huggerebecca Varner, Nita SicklePT;Rodney Wishart, PT OT Present: Other (comment) Gentry Fitz(Elisabeth Doe-OT) SLP Present: Jackalyn LombardNicole Page, SLP PPS Coordinator present : Tora DuckMarie Noel, RN, CRRN  Current Status/Progress Goal Weekly Team Focus  Medical   Weakness and poor activity tolerance secondary to debility/amiodarone toxicity from respiratory distress with CP and extreme SOB  Mobility, severe poor activity tolerance, CP, SOB  See above   Bowel/Bladder   Cont. B&B,LBM 11/7  To remain cont. of B&B  Monitor B&B function   Swallow/Nutrition/ Hydration             ADL's   Supervision functional transfers, Set-up ADLs,  limited by low endurance/activity tolerance and O2 needs. Desats 70's-80's with minimal activity.   overall supervision  activity tolerance, ADL modifications, general strengthening/endurance, energy conservation during ADls   Mobility   supervision for bed mobility, transfers, and gait; desats to 70's-80's on 10 L02 via [place] with minimal activity and requires prolonged rest breaks  supervision-mod I  activity tolerance, functional mobility, standing balance, energy conservation, pt/fam education   Communication             Safety/Cognition/ Behavioral Observations           Pain   No c/o pain   <3  Assess and treat during q shift   Skin   Skin tear-L chest  No skin breakdown/infection  Assess skin q shift       *See Care Plan and progress notes for long and short-term goals.   Barriers to Discharge: Activity tolerance, CP, SOB, amiodarone toxicity, leukocytosis, anemia   Possible Resolutions to Barriers:  Pulm consult, ?Cards consult, follow labs   Discharge Planning/Teaching Needs:  Home with wife who can provide supervision level of assist. She has been here and observed pt in therapies.      Team Discussion:  Goals supervision level main issues are his desating with O2 and his ability to participate in the rehab program-this is too much for him. He is having difficult with 15/7. MD reports Pulm MD has ordered a Chest CT and may get cardiology involved. Will await medical issues. Will be done with rehab on Sat.  Revisions to Treatment Plan:  DC sat if medically stable    Continued Need for Acute Rehabilitation Level of Care: The patient requires daily medical management by a physician with specialized training in physical medicine and rehabilitation for the following conditions: Daily direction of a multidisciplinary physical rehabilitation program to ensure safe treatment while eliciting the highest outcome that is of practical value to the patient.: Yes Daily medical management of  patient stability for increased activity during participation in an intensive rehabilitation regime.: Yes Daily analysis of laboratory values and/or radiology reports with any subsequent need for medication adjustment of medical intervention for : Cardiac problems;Pulmonary problems   Lucy Chris 12/13/2015, 4:55 PM       Patient ID: [name], male   DOB: 22-Nov-1945, 70 y.o.   MRN: [id number]

## 2015-12-13 NOTE — Patient Care Conference (Signed)
Inpatient RehabilitationTeam Conference and Plan of Care Update Date: 12/13/2015   Time: 2: 3250 PM    Patient Name: [name]      Medical Record Number: [id number]  Date of Birth: 04-09-1945 Sex: Male         Room/Bed: 4W08C/4W08C-01 Payor Info: Payor: MEDICARE / Plan: MEDICARE PART A AND B / Product Type: *No Product type* /    Admitting Diagnosis: Debility  Admit Date/Time:  12/06/2015  4:30 PM Admission Comments: No comment available   Primary Diagnosis:  <principal problem not specified> Principal Problem: <principal problem not specified>  Patient Active Problem List   Diagnosis Date Noted  . Postcholecystectomy diarrhea 12/12/2015  . Coronary artery disease involving native coronary artery with angina pectoris (HCC) 12/12/2015  . Loose stools   . Unstable angina pectoris (HCC)   . Hypoalbuminemia due to protein-calorie malnutrition (HCC)   . Physical debility 12/06/2015  . Amiodarone toxicity   . Anemia of chronic disease   . Anxiety about health   . Chronic systolic CHF (congestive heart failure) (HCC)   . Coronary artery disease involving native coronary artery of native heart without angina pectoris   . Diarrhea   . History of GI bleed   . History of ventricular fibrillation   . Lymphocytosis   . PAF (paroxysmal atrial fibrillation) (HCC)   . Respiratory distress   . S/P implantation of automatic cardioverter/defibrillator (AICD)   . Supplemental oxygen dependent   . Acute hypoxemic respiratory failure (HCC)   . Malnutrition of moderate degree 11/29/2015  . Opacity of lung on imaging study   . Physical deconditioning 11/28/2015  . Transaminitis 11/28/2015  . Anemia 11/28/2015  . Elevated troponin   . Hypoxia   . AICD (automatic cardioverter/defibrillator) present 11/08/2015  . HCAP (healthcare-associated pneumonia) 11/07/2015  . Acute respiratory failure with hypoxia (HCC) 11/07/2015  . Fall at home 10/11/2015  . VT (ventricular tachycardia) (HCC)  09/27/2015  . Memory loss 09/24/2015  . Encounter for screening examination for infectious disease 09/24/2015  . Chronic atrial fibrillation (HCC) 05/28/2015  . History of ventricular tachycardia 05/25/2015  . Syncope 05/12/2015  . Bradycardia 05/12/2015  . Acute non-recurrent maxillary sinusitis 03/23/2015  . Advance care planning 09/16/2014  . Muscle ache 09/16/2014  . Back pain 09/08/2013  . Abdominal pain, chronic, epigastric 09/08/2013  . Fatigue 06/11/2013  . Chest wall mass 11/05/2012  . Arthritis   . Insomnia   . Anxiety   . Diverticulosis 12/11/2011  . Tobacco abuse 03/06/2011  . Ischemic cardiomyopathy 02/12/2011  . Acute on chronic systolic CHF (congestive heart failure) (HCC) 05/10/2010  . Coronary atherosclerosis 10/31/2008  . Hyperlipidemia 08/25/2008  . ALLERGIC RHINITIS 08/25/2008  . Amiodarone pulmonary toxicity 08/25/2008    Expected Discharge Date: Expected Discharge Date: 12/16/15  Team Members Present: Physician leading conference: Dr. Maryla MorrowAnkit Patel Social Worker Present: Dossie DerBecky Otelia Hettinger, LCSW Nurse Present: Carmie EndAngie Joyce, RN PT Present: Bayard Huggerebecca Varner, Nita SicklePT;Rodney Wishart, PT OT Present: Other (comment) Gentry Fitz(Elisabeth Doe-OT) SLP Present: Jackalyn LombardNicole Page, SLP PPS Coordinator present : Tora DuckMarie Noel, RN, CRRN     Current Status/Progress Goal Weekly Team Focus  Medical   Weakness and poor activity tolerance secondary to debility/amiodarone toxicity from respiratory distress with CP and extreme SOB  Mobility, severe poor activity tolerance, CP, SOB  See above   Bowel/Bladder   Cont. B&B,LBM 11/7  To remain cont. of B&B  Monitor B&B function   Swallow/Nutrition/ Hydration  ADL's   Supervision functional transfers, Set-up ADLs, limited by low endurance/activity tolerance and O2 needs. Desats 70's-80's with minimal activity.   overall supervision  activity tolerance, ADL modifications, general strengthening/endurance, energy conservation during ADls    Mobility   supervision for bed mobility, transfers, and gait; desats to 70's-80's on 10 L02 via [place] with minimal activity and requires prolonged rest breaks  supervision-mod I  activity tolerance, functional mobility, standing balance, energy conservation, pt/fam education   Communication             Safety/Cognition/ Behavioral Observations            Pain   No c/o pain   <3  Assess and treat during q shift   Skin   Skin tear-L chest  No skin breakdown/infection  Assess skin q shift      *See Care Plan and progress notes for long and short-term goals.  Barriers to Discharge: Activity tolerance, CP, SOB, amiodarone toxicity, leukocytosis, anemia    Possible Resolutions to Barriers:  Pulm consult, ?Cards consult, follow labs    Discharge Planning/Teaching Needs:  Home with wife who can provide supervision level of assist. She has been here and observed pt in therapies.      Team Discussion:  Goals supervision level main issues are his desating with O2 and his ability to participate in the rehab program-this is too much for him. He is having difficult with 15/7. MD reports Pulm MD has ordered a Chest CT and may get cardiology involved. Will await medical issues. Will be done with rehab on Sat.  Revisions to Treatment Plan:  DC sat if medically stable   Continued Need for Acute Rehabilitation Level of Care: The patient requires daily medical management by a physician with specialized training in physical medicine and rehabilitation for the following conditions: Daily direction of a multidisciplinary physical rehabilitation program to ensure safe treatment while eliciting the highest outcome that is of practical value to the patient.: Yes Daily medical management of patient stability for increased activity during participation in an intensive rehabilitation regime.: Yes Daily analysis of laboratory values and/or radiology reports with any subsequent need for medication adjustment of  medical intervention for : Cardiac problems;Pulmonary problems  Kimela Malstrom, Lemar Livings 12/13/2015, 4:55 PM

## 2015-12-14 ENCOUNTER — Inpatient Hospital Stay (HOSPITAL_COMMUNITY): Payer: Medicare Other | Admitting: Occupational Therapy

## 2015-12-14 ENCOUNTER — Inpatient Hospital Stay (HOSPITAL_COMMUNITY): Payer: Medicare Other | Admitting: Physical Therapy

## 2015-12-14 ENCOUNTER — Inpatient Hospital Stay (HOSPITAL_COMMUNITY): Payer: Medicare Other

## 2015-12-14 DIAGNOSIS — R0902 Hypoxemia: Secondary | ICD-10-CM

## 2015-12-14 DIAGNOSIS — I5023 Acute on chronic systolic (congestive) heart failure: Secondary | ICD-10-CM

## 2015-12-14 DIAGNOSIS — J80 Acute respiratory distress syndrome: Secondary | ICD-10-CM

## 2015-12-14 DIAGNOSIS — J810 Acute pulmonary edema: Secondary | ICD-10-CM

## 2015-12-14 LAB — BASIC METABOLIC PANEL
ANION GAP: 8 (ref 5–15)
BUN: 32 mg/dL — ABNORMAL HIGH (ref 6–20)
CALCIUM: 8 mg/dL — AB (ref 8.9–10.3)
CO2: 28 mmol/L (ref 22–32)
Chloride: 103 mmol/L (ref 101–111)
Creatinine, Ser: 1.07 mg/dL (ref 0.61–1.24)
GFR calc Af Amer: >60 mL/min (ref >60)
GFR calc non Af Amer: >60 mL/min (ref >60)
GLUCOSE: 99 mg/dL (ref 65–99)
Potassium: 3.6 mmol/L (ref 3.5–5.1)
Sodium: 139 mmol/L (ref 135–145)

## 2015-12-14 MED ORDER — IOPAMIDOL (ISOVUE-370) INJECTION 76%
INTRAVENOUS | Status: AC
  Administered 2015-12-14: 60 mL
  Filled 2015-12-14: qty 100

## 2015-12-14 MED ORDER — FUROSEMIDE 10 MG/ML IJ SOLN
40.0000 mg | Freq: Four times a day (QID) | INTRAMUSCULAR | Status: AC
  Administered 2015-12-14 – 2015-12-15 (×3): 40 mg via INTRAVENOUS
  Filled 2015-12-14 (×3): qty 4

## 2015-12-14 MED ORDER — POTASSIUM CHLORIDE CRYS ER 20 MEQ PO TBCR
40.0000 meq | EXTENDED_RELEASE_TABLET | Freq: Three times a day (TID) | ORAL | Status: AC
  Administered 2015-12-15: 40 meq via ORAL
  Filled 2015-12-14: qty 2

## 2015-12-14 NOTE — Progress Notes (Signed)
Name: [name] MRN: [id number] DOB: 1945/05/14    ADMISSION DATE:  12/06/2015 CONSULTATION DATE: 11/8  REFERRING MD : Rehab  CHIEF COMPLAINT:  SOB  BRIEF PATIENT DESCRIPTION: Frail thin WM  SIGNIFICANT EVENTS    STUDIES:     HISTORY OF PRESENT ILLNESS:   70 yo with PAF/CAD/AICD/EF 30% recent treatment x 2 for pna in October 2017 with 2 admissions. Amiodarone toxicty stopped in October. Admitted to [facility] for AICD firing x 2 on 10/24 along with FTT. He was discharged 11/1 to rehab for being frail ans weak. On 11/7 he was more sob and he has O2 increased to 8 l/m. PCCM asked to evaluate on 11/8.     SUBJECTIVE:  NAD at rest  VITAL SIGNS: Temp:  [97.8 F (36.6 C)] 97.8 F (36.6 C) (11/09 0512) Pulse Rate:  [71] 71 (11/09 0512) Resp:  [14] 14 (11/09 0512) BP: (107)/(70) 107/70 (11/09 0512) SpO2:  [91 %-95 %] 91 % (11/09 0512) Weight:  [131 lb 2.8 oz (59.5 kg)] 131 lb 2.8 oz (59.5 kg) (11/09 0512)  PHYSICAL EXAMINATION: General:  Frail, nervous male Neuro: Poor memory, follows commands HEENT:  No JVD/LAn Cardiovascular:  HSR Lungs:  Bibasilar crackles, improved Abdomen: soft +_bs Musculoskeletal: muscle wasting Skin:  Warm and dry   Recent Labs Lab 12/12/15 0225 12/12/15 2328 12/14/15 0537  NA 138 137 139  K 4.3 4.2 3.6  CL 103 103 103  CO2 28 25 28   BUN 30* 26* 32*  CREATININE 0.89 0.87 1.07  GLUCOSE 100* 132* 99    Recent Labs Lab 12/11/15 0926 12/12/15 0225  HGB 9.7* 9.9*  HCT 31.3* 32.0*  WBC 18.2* 19.0*  PLT 382 389   Dg Chest 2 View  Result Date: 12/13/2015 CLINICAL DATA:  Short of breath since yesterday EXAM: CHEST  2 VIEW COMPARISON:  12/05/2015 FINDINGS: Left subclavian AICD device is stable. Cardiomegaly is stable. Bilateral airspace disease has improved throughout the left lung and is stable in the right lung. No pneumothorax. No pleural effusion. IMPRESSION: Bilateral airspace disease is improved on the left and stable on the  right. These findings suggest improving CHF and pulmonary edema. Electronically Signed   By: Jolaine Click M.D.   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 Amiodarone toxicty stopped in October. Admitted to [facility] for AICD firing x 2 on 10/24 along with FTT. He was discharged 11/1 to  rehab for being frail ans weak. On 11/7 he was more sob and he has O2 increased to 8 l/m. PCCM asked to evaluate on 11/8. 11/9 diuresed 1000 cc for 11/9. Still on high flow O2. On prednisone 60 for amio reaction.     PLAN: - -Consider CT chest PE protocol to ro PE. this will also document changes to lung process and ro pe. -O2 as needed -Decreased xopenex to proper dose. -Consider Cards to reevaluate . -Control anxiety -He may be end stage - Consider LTAC for steroid wean and close observation.  Brett CanalesSteve Minor ACNP [facility] PCCM Pager [phone] till 3 pm If no answer page [phone]  Attending Note:  70 year old male with PMH of CHF and amiodarone toxicity who has been off amiodarone since October presenting with worsening hypoxemic respiratory failure.  On  exam, crackles at the bases with no wheezing.  I reviewed CXR myself, evidence of cephalization noted.  Discussed with PCCM-NP.  Acute hypoxemic respiratory failure:             - Supplemental O2.             - Titrate O2 for sat of 88-92%.  Acute pulmonary edema:             - Repeat lasix 40 mg IV q8 x2 doses.  - Will give additional K  - Repeat CXR in AM             - F/U BMET in AM.  Pulmonary fibrosis: suspect due to previous amiodarone toxicity.             - Continue to hold amiodarone.             - No role for steroids at this time.  Obstructive lung disease:             - Adjust xopenex.   Patient diuresed but continues to require high flow O2.  Will attempt weaning and diuresing another day.  Concern is that this may be end-stage fibrosis.  Will repeat CXR again in AM.  Patient seen and examined, agree with above note.  I dictated the care and orders written for this patient under my direction.  Alyson ReedyWesam G Cozy Veale, MD [phone]

## 2015-12-14 NOTE — Progress Notes (Signed)
Occupational Therapy Session Note  Patient Details  Name: [name] MRN: [id number] Date of Birth: 1945/03/27  Today's Date: 12/14/2015  Session 1 OT Individual Time: 0900-0950 OT Individual Time Calculation (min): 50 min   Session 2 OT Individual Time: 1400-1500 OT Individual Time Calculation (min): 60 min    Short Term Goals: Week 1:  OT Short Term Goal 1 (Week 1): Pt will complete shower transfers with close S. OT Short Term Goal 2 (Week 1): Pt will be able to tolerate standing for 2 minutes with O2 sats >90. OT Short Term Goal 3 (Week 1): Pt will don pants with close S.  Skilled Therapeutic Interventions/Progress Updates:    Session 1 1:1 OT session focused on bathing/dressing modifications re: energy conservation. Pt completed sponge bath at sink and dressing at 10L with multiple rest breaks. Desat to 80  once after standing to don pants- recovered quickly to 90 with seated rest berak. Pt's SpO2 flucuated between 85-92 with rest breaks taken. Pt left seated up in w.c at end of session with needs met.  Session 2 1:1 OT session with spouse present. Pt reports he is having a great day today and eager to participate in therapy. 10L of O2 administered throughout session. Pt's SpO2 remained above 85 w/ transfer OOB, rest break, then ambulating 10 ft w/ Rw. Pt brought down to therapy gym and completed LB strengthening activities including seated knee extensions with 4lb ankle weights, mini squats, and sit<> stands 3 sets x10 reps. Pt required multiple rest breaks in between activity but maintained SpO2 above 85 HR  ~ 80-90. At end of session, pt ambulated 20 feet w/ RW and close supervision. HR 152, desat to 80-. Seated rest break required to recover, HR down to 90 and SpO2 90. Discussed plan for dc home Saturday and energy conservation techniques within home environment. Pt will be limited to sponge baths 2/2 only 1/2 bath accessible on main level. Pt's spouse has concerns about  medically caring for pt at home- however, she felt better about taking pt home after his improved participation in therapy today. Pt left supine in bed with spouse present and needs met.    Therapy Documentation Precautions:  Precautions Precautions: Fall Precaution Comments: high flow O2 Restrictions Weight Bearing Restrictions: No Pain: Pain Assessment Pain Assessment: No/denies pain Pain Score: 0-No pain ADL: ADL ADL Comments: refer to functional navigator    See Function Navigator for Current Functional Status.   Therapy/Group: Individual Therapy  Valma Cava 12/14/2015, 4:24 PM

## 2015-12-14 NOTE — Progress Notes (Signed)
Advanced Heart Failure Rounding Note   PCP: Crawford Givens, MD Primary Cardiologist/EP: Dr. [name]  Subjective:    Initially admitted 11/28/15 with respiratory failure.  Had VT with shock 11/29/15. EP followed. Tried on lidocaine and ranolazine ( no amio with toxicity). Thought to have some lidocaine toxicity vs low output.  PICC placed with Coox 65%.  Lidocaine stopped with resolution of AMS.   Required NRB with de-sats into 70-80s while sleeping.   Moved to CIR 12/06/15  Called back to see patient with increasing 02 demand and worsening DOE.  No distress at rest.   Weights have been inaccurate. Show down 9 lbs since admit to CIR, though I/O positive. Creatinine and K stable.  Objective:   Weight Range: 131 lb 2.8 oz (59.5 kg) Body mass index is 23.24 kg/m.   Vital Signs:   Temp:  [97.8 F (36.6 C)] 97.8 F (36.6 C) (11/09 0512) Pulse Rate:  [71] 71 (11/09 0512) Resp:  [14] 14 (11/09 0512) BP: (107)/(70) 107/70 (11/09 0512) SpO2:  [91 %-95 %] 91 % (11/09 0512) Weight:  [131 lb 2.8 oz (59.5 kg)] 131 lb 2.8 oz (59.5 kg) (11/09 0512) Last BM Date: 12/13/15  Weight change: Filed Weights   12/12/15 0458 12/13/15 0500 12/14/15 0512  Weight: 132 lb 4.4 oz (60 kg) 136 lb 8 oz (61.9 kg) 131 lb 2.8 oz (59.5 kg)    Intake/Output:   Intake/Output Summary (Last 24 hours) at 12/14/15 1527 Last data filed at 12/14/15 1330  Gross per 24 hour  Intake              720 ml  Output             1600 ml  Net             -880 ml     Physical Exam: General:  Well appearing. Sitting in gym HEENT: Normal, with some temporal wasting.   Neck: supple. JVP 6-7 cm. Carotids 2+ bilat; no bruits. No thyromegaly or nodule noted.  Cor: PMI nondisplaced. Regular. 3/6 HSM apex.  Lungs: Clear, normal effort Abdomen: soft, NT, ND, no HSM. No bruits or masses. +BS  Extremities: no cyanosis, clubbing, rash. No peripheral edema Neuro: alert & orientedx3, cranial nerves grossly intact. moves all  4 extremities w/o difficulty. Affect flat.   Labs: CBC  Recent Labs  12/12/15 0225  WBC 19.0*  HGB 9.9*  HCT 32.0*  MCV 87.0  PLT 389   Basic Metabolic Panel  Recent Labs  12/12/15 0225 12/12/15 2328 12/13/15 0755 12/14/15 0537  NA 138 137  --  139  K 4.3 4.2  --  3.6  CL 103 103  --  103  CO2 28 25  --  28  GLUCOSE 100* 132*  --  99  BUN 30* 26*  --  32*  CREATININE 0.89 0.87  --  1.07  CALCIUM 8.0* 7.9*  --  8.0*  MG 1.7  --  1.9  --    Liver Function Tests No results for input(s): AST, ALT, ALKPHOS, BILITOT, PROT, ALBUMIN in the last 72 hours. No results for input(s): LIPASE, AMYLASE in the last 72 hours. Cardiac Enzymes  Recent Labs  12/12/15 2055 12/12/15 2328 12/13/15 0755  TROPONINI 0.03* 0.04* 0.04*    BNP: BNP (last 3 results)  Recent Labs  09/27/15 0658 11/07/15 0253 11/28/15 0737  BNP 184.8* 184.2* 494.0*    ProBNP (last 3 results) No results for input(s): PROBNP in the  last 8760 hours.   D-Dimer No results for input(s): DDIMER in the last 72 hours. Hemoglobin A1C No results for input(s): HGBA1C in the last 72 hours. Fasting Lipid Panel No results for input(s): CHOL, HDL, LDLCALC, TRIG, CHOLHDL, LDLDIRECT in the last 72 hours. Thyroid Function Tests No results for input(s): TSH, T4TOTAL, T3FREE, THYROIDAB in the last 72 hours.  Invalid input(s): FREET3  Other results:     Imaging/Studies:  Dg Chest 2 View  Result Date: 12/13/2015 CLINICAL DATA:  Short of breath since yesterday EXAM: CHEST  2 VIEW COMPARISON:  12/05/2015 FINDINGS: Left subclavian AICD device is stable. Cardiomegaly is stable. Bilateral airspace disease has improved throughout the left lung and is stable in the right lung. No pneumothorax. No pleural effusion. IMPRESSION: Bilateral airspace disease is improved on the left and stable on the right. These findings suggest improving CHF and pulmonary edema. Electronically Signed   By: Jolaine ClickArthur  Hoss M.D.   On:  12/13/2015 11:17    Latest Echo  Latest Cath   Medications:     Scheduled Medications: . apixaban  5 mg Oral BID  . aspirin EC  81 mg Oral Daily  . carvedilol  3.125 mg Oral BID WC  . donepezil  10 mg Oral QHS  . famotidine  20 mg Oral Daily  . feeding supplement  1 Container Oral TID BM  . feeding supplement (PRO-STAT SUGAR FREE 64)  30 mL Oral BID  . furosemide  20 mg Oral Q M,W,F  . guaiFENesin  600 mg Oral BID  . loratadine  10 mg Oral Daily  . losartan  12.5 mg Oral Daily  . mouth rinse  15 mL Mouth Rinse BID  . mexiletine  200 mg Oral Q8H  . multivitamin with minerals  1 tablet Oral Daily  . predniSONE  60 mg Oral Q breakfast  . ranolazine  500 mg Oral BID    Infusions:   PRN Medications: acetaminophen, ALPRAZolam, alum & mag hydroxide-simeth, benzonatate, bisacodyl, calcium carbonate, diphenhydrAMINE, guaiFENesin-dextromethorphan, levalbuterol, loperamide, metaxalone, nitroGLYCERIN, prochlorperazine **OR** prochlorperazine **OR** prochlorperazine, sodium phosphate, traMADol, zolpidem   Assessment/Plan   1. Chronic systolic CHF: Ischemic cardiomyopathy.  Echo 10/17 with EF 25-30%, severe MR.  Has Medtronic ICD. - Continue Coreg 3.125 mg bid. - Continue losartan 12.5 daily.   - Continue lasix 40 mg M/W/F for now.  - Pt is now more dyspneic on exertion. CTA chest pending to r/u PE.  2. VT: Has Medtronic ICD.  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  - Neurological symptoms resolved off of lidocaine. ? Component of lidocaine toxicity as well.  - He remains on mexiletine + ranolazine. No more VT noted. 3. Acute hypoxic respiratory failure: Diffuse interstitial airspace disease on CXR.  CCM following.  Suspect amiodarone lung toxicity and not PNA or pulmonary edema.  - Continues on prednisone taper per primary.  4. CAD: LHC 09/28/15 with chronic total occlusion of the proximal RCA and moderate disease in Distal LCx and third OM.  He does not  tolerate statins.   5. Hyperthyroidism: ? 2/2 amiodarone toxicity.  Management per [facility].  6. Atrial fibrillation: Paroxysmal, currently in NSR.  Continue apixaban.  7. Debilitated - Improving. Continue CIR.    More DOE. Work up pending.  CCM have suggested LTAC if continues to require high level of assistance.  DOE is currently impeding rehab.   Length of Stay: 8  Graciella FreerMichael Andrew Tillery PA-C 12/14/2015, 3:27 PM  Advanced Heart Failure Team Pager [phone] (  M-F; 7a - 4p)  Please contact [facility] for night-coverage after hours (4p -7a ) and weekends on [email or web address]  Patient seen and examined with Otilio Saber, PA-C. We discussed all aspects of the encounter. I agree with the assessment and plan as stated above.   Volume status was up yesterday and respiratory status worse. Now much improved with lasix. Continue lasix 20 mg on M/W/F. (Doesnt think he can handle more). Can tak extra lasix as needed. D/w him and his wife.   Salam Chesterfield,MD 4:09 PM

## 2015-12-14 NOTE — Progress Notes (Signed)
PHYSICAL MEDICINE & REHABILITATION     PROGRESS NOTE  Subjective/Complaints:  Pt seen laying in bed this AM.  He states he feels a lot of fluid was removed yesterday.  He states he feels better. Yesterday he had another episode of dyspnea with minimal exertion.  Pulm evaluated, to reeevaluate this AM.  ROS: +DOE. Denies CP, N/V/D.  Objective: Vital Signs: Blood pressure 107/70, pulse 71, temperature 97.8 F (36.6 C), temperature source Oral, resp. rate 14, height 5\' 3"  (1.6 m), weight 59.5 kg (131 lb 2.8 oz), SpO2 91 %. Dg Chest 2 View  Result Date: 12/13/2015 CLINICAL DATA:  Short of breath since yesterday EXAM: CHEST  2 VIEW COMPARISON:  12/05/2015 FINDINGS: Left subclavian AICD device is stable. Cardiomegaly is stable. Bilateral airspace disease has improved throughout the left lung and is stable in the right lung. No pneumothorax. No pleural effusion. IMPRESSION: Bilateral airspace disease is improved on the left and stable on the right. These findings suggest improving CHF and pulmonary edema. Electronically Signed   By: Jolaine Click M.D.   On: 12/13/2015 11:17    Recent Labs  12/11/15 0926 12/12/15 0225  WBC 18.2* 19.0*  HGB 9.7* 9.9*  HCT 31.3* 32.0*  PLT 382 389    Recent Labs  12/12/15 2328 12/14/15 0537  NA 137 139  K 4.2 3.6  CL 103 103  GLUCOSE 132* 99  BUN 26* 32*  CREATININE 0.87 1.07  CALCIUM 7.9* 8.0*   CBG (last 3)  No results for input(s): GLUCAP in the last 72 hours.  Wt Readings from Last 3 Encounters:  12/14/15 59.5 kg (131 lb 2.8 oz)  12/06/15 61.7 kg (136 lb)  11/24/15 63.8 kg (140 lb 12 oz)    Physical Exam:  BP 107/70 (BP Location: Right Arm)   Pulse 71   Temp 97.8 F (36.6 C) (Oral)   Resp 14   Ht 5\' 3"  (1.6 m)   Wt 59.5 kg (131 lb 2.8 oz)   SpO2 91%   BMI 23.24 kg/m  Constitutional: He appears cachectic. NAD. HENT: Normocephalicand atraumatic.  Eyes: EOMI. No discharge.  Cardiovascular: Normal rateand regular  rhythm. No JVD. Respiratory: +Spurgeon. SOB with minimal exertion. Normaland breath sounds normal. No stridor. No respiratory distress. He has no wheezes.  GI: Soft. Bowel sounds are normal. He exhibits no distension.  Musculoskeletal: He exhibits no edemaor tenderness.  Neurological: He is alertand oriented.  Motor: 4+/5 throughout (unchanged) Skin: Skin is warmand dry.  Not diaphoretic.  Assessment/Plan: 1. Functional deficits secondary to debility from respiratory distress which require 3+ hours per day of interdisciplinary therapy in a comprehensive inpatient rehab setting. Physiatrist is providing close team supervision and 24 hour management of active medical problems listed below. Physiatrist and rehab team continue to assess barriers to discharge/monitor patient progress toward functional and medical goals.  Function:  Bathing Bathing position   Position: Shower  Bathing parts Body parts bathed by patient: Left arm, Right arm, Chest, Abdomen, Front perineal area, Buttocks, Right upper leg, Left upper leg Body parts bathed by helper: Right lower leg, Left lower leg, Back  Bathing assist Assist Level: Touching or steadying assistance(Pt > 75%)      Upper Body Dressing/Undressing Upper body dressing   What is the patient wearing?: Pull over shirt/dress     Pull over shirt/dress - Perfomed by patient: Thread/unthread right sleeve, Thread/unthread left sleeve, Put head through opening, Pull shirt over trunk  Upper body assist Assist Level: Set up      Lower Body Dressing/Undressing Lower body dressing   What is the patient wearing?: Pants, Shoes Underwear - Performed by patient: Thread/unthread right underwear leg, Thread/unthread left underwear leg, Pull underwear up/down   Pants- Performed by patient: Thread/unthread left pants leg, Thread/unthread right pants leg, Pull pants up/down           Shoes - Performed by patient: Don/doff right shoe, Don/doff left  shoe            Lower body assist Assist for lower body dressing: Supervision or verbal cues      Toileting Toileting   Toileting steps completed by patient: Adjust clothing prior to toileting, Adjust clothing after toileting, Performs perineal hygiene   Toileting Assistive Devices: Other (comment) (urinal)  Toileting assist Assist level: Set up/obtain supplies, Supervision or verbal cues   Transfers Chair/bed transfer   Chair/bed transfer method: Ambulatory Chair/bed transfer assist level: Supervision or verbal cues Chair/bed transfer assistive device: Patent attorney     Max distance: 150 ft Assist level: Supervision or verbal cues   Wheelchair       Assist Level: Dependent (Pt equals 0%)  Cognition Comprehension Comprehension assist level: Follows complex conversation/direction with extra time/assistive device  Expression Expression assist level: Expresses complex ideas: With extra time/assistive device  Social Interaction Social Interaction assist level: Interacts appropriately with others with medication or extra time (anti-anxiety, antidepressant).  Problem Solving Problem solving assist level: Solves complex problems: With extra time  Memory Memory assist level: Recognizes or recalls 90% of the time/requires cueing < 10% of the time     Medical Problem List and Plan: 1.  Weakness and poor activity tolerance secondary to debility from respiratory distress.  Cont CIR  Pt needs to very gradually progress with activity due to severely limited tolerance due to SOB, CP, Desats, therapies. Pt continues to have difficulty tolerating despite 15/7. 2.  DVT Prophylaxis/Anticoagulation: Pharmaceutical: Other (comment)--Eliquis. H/o GIB in the past. Monitor for H/H.  3. Pain Management: Ultram prn. Local measures with heat/ice prn 4. Mood: Anxiety resolved. LCSW to follow for evaluation and support.  5. Neuropsych: This patient is capable of making  decisions on his own behalf. 6. Skin/Wound Care: Maintain adequate nutritional and hydration status. Routine pressure relief measures 7. Fluids/Electrolytes/Nutrition: Monitor I/O.  8. Amiodarone toxicity with respiratory failure: Needs to maintain saturation over 92%--on prednisone 60 mg daily since 10/30.             Taper by 10 mg every 2 weeks with outpatient f/u.   Recs for O2 supplementation per Pulm.  Appreciate recs  Nebs scheduled, will need to monitor HR  Pulm to reevlaute, diuresed, possible CTA, will await recs 9. VT/VF s/p AICD: On Mexitil and Ranexa  10. CAD with chronic systolic CHF with severe LVD: Cath 09/28/15 showed 2 vessel obstructive disease--unchanged from 2004. Unable to tolerate statins -- On Ranexa, Coreg and Cozaar. Lasix on hold.  Chest pain with increased activity.  Troponins stable, occurs with increased activity and drop in Sats, therapy schedule changed  Diuresing  11.  Leucocytosis:   Secondary to steroids, afebrile  WBCs 19.0 on 11/7  Cont to monitor  UA slightly+, Ucx NG, abx d/ced  CXR reviewed 10/31, ?PNA vs edema - cont to monitor, repeat reviewed 11/8 - CHF/Pulm edema.  Labs pending 12  Anemia:   Hb 9.9 on 11/7  Cont to monitor  Labs pending  13. Diarrhea: Resolved  Cont to monitor  But chronic loose stools after cholecystectomy, immodium started after BMs (home reg) 14. H/o AFib: In NSR. On eliquis --monitor off amiodarone. On Mexitil every 8 hours.  15. Hypoalbuminemia  Supplement started 11/2 16. AKI  Cr 1.07 on 11/9  After diuresis  Cont to monitor   LOS (Days) 8 A FACE TO FACE EVALUATION WAS PERFORMED  Ankit Karis Jubanil Patel 12/14/2015 8:44 AM

## 2015-12-14 NOTE — Progress Notes (Signed)
Physical Therapy Session Note  Patient Details  Name: [name] MRN: [id number] Date of Birth: Nov 29, 1945  Today's Date: 12/14/2015 PT Individual Time: 1128-1208 and 6834-1962 PT Individual Time Calculation (min): 40 min and 38 min   Short Term Goals: Week 1:  PT Short Term Goal 1 (Week 1): Patient will ambulate 50 ft using LRAD with close supervision and min cues for safety.  PT Short Term Goal 2 (Week 1): Patient will perform transfers with supervision and min cues for safety.  PT Short Term Goal 3 (Week 1): Patient will negotiate up/down 13 stairs using 2 rails with min A.  PT Short Term Goal 4 (Week 1): Patient will propel wheelchair x 50 ft for strengthening and endurance.   Skilled Therapeutic Interventions/Progress Updates:    Treatment 1: Patient in bed with wife present for family training. Focus on discharge planning including barriers to discharge home, need for 24/7 supervision, DME needs including transport chair versus wheelchair and RW, f/u HHPT, stand pivot transfers bed <> wheelchair with supervision, and initiated hands-on family training for stair negotiation up/down 4 (6") stairs x 1 using 2 rails with close supervision and x 2 without rails to simulate home entry with up to mod A to prevent fall due to LE buckling/weakness with seated rest break in between. Continue family training in preparation for discharge home. Patient on 10 L 02 via [place] throughout session monitored intermittently with Sp02 > 90%. Patient left in bed with needs in reach and wife present.   Treatment 2: Patient in bed, wife present for continued family training with wife providing supervision and 02 tank management for ambulation using RW 2 x 50 ft with improved safe pace, simulated car transfer using RW with supervision and verbal cues for safe hand placement with sit <> stand using RW, and stair training up/down 3 (6") stairs without rails x 2 with seated rest break between and PT providing min A on  first trial and wife providing min A on second trial. Patient on 10 L 02 via [place] with vitals monitored intermittently with Sp02 between 87-96%. Patient required frequent rest breaks due to DOE and fatigue. Patient tolerated therapies much better today. Patient returned to bed with supervision and left sitting EOB with wife present and needs in reach.   Therapy Documentation Precautions:  Precautions Precautions: Fall Precaution Comments: high flow O2 Restrictions Weight Bearing Restrictions: No Pain: Pain Assessment Pain Assessment: No/denies pain Pain Score: 0-No pain  See Function Navigator for Current Functional Status.   Therapy/Group: Individual Therapy  Kerney Elbe 12/14/2015, 1:01 PM

## 2015-12-15 ENCOUNTER — Inpatient Hospital Stay (HOSPITAL_COMMUNITY): Payer: Medicare Other

## 2015-12-15 ENCOUNTER — Inpatient Hospital Stay (HOSPITAL_COMMUNITY): Payer: Medicare Other | Admitting: Occupational Therapy

## 2015-12-15 ENCOUNTER — Inpatient Hospital Stay (HOSPITAL_COMMUNITY): Payer: Medicare Other | Admitting: Physical Therapy

## 2015-12-15 ENCOUNTER — Telehealth: Payer: Self-pay

## 2015-12-15 DIAGNOSIS — J432 Centrilobular emphysema: Secondary | ICD-10-CM

## 2015-12-15 DIAGNOSIS — J9601 Acute respiratory failure with hypoxia: Secondary | ICD-10-CM | POA: Insufficient documentation

## 2015-12-15 DIAGNOSIS — J90 Pleural effusion, not elsewhere classified: Secondary | ICD-10-CM

## 2015-12-15 DIAGNOSIS — N179 Acute kidney failure, unspecified: Secondary | ICD-10-CM

## 2015-12-15 DIAGNOSIS — R0602 Shortness of breath: Secondary | ICD-10-CM

## 2015-12-15 LAB — PHOSPHORUS: Phosphorus: 3.6 mg/dL (ref 2.5–4.6)

## 2015-12-15 LAB — CBC
HCT: 36.6 % — ABNORMAL LOW (ref 39.0–52.0)
Hemoglobin: 11.3 g/dL — ABNORMAL LOW (ref 13.0–17.0)
MCH: 27.2 pg (ref 26.0–34.0)
MCHC: 30.9 g/dL (ref 30.0–36.0)
MCV: 88.2 fL (ref 78.0–100.0)
PLATELETS: 358 10*3/uL (ref 150–400)
RBC: 4.15 MIL/uL — ABNORMAL LOW (ref 4.22–5.81)
RDW: 16.9 % — AB (ref 11.5–15.5)
WBC: 17.3 10*3/uL — AB (ref 4.0–10.5)

## 2015-12-15 LAB — BASIC METABOLIC PANEL
ANION GAP: 11 (ref 5–15)
BUN: 33 mg/dL — ABNORMAL HIGH (ref 6–20)
CALCIUM: 7.9 mg/dL — AB (ref 8.9–10.3)
CO2: 30 mmol/L (ref 22–32)
Chloride: 98 mmol/L — ABNORMAL LOW (ref 101–111)
Creatinine, Ser: 1.27 mg/dL — ABNORMAL HIGH (ref 0.61–1.24)
GFR calc Af Amer: >60 mL/min (ref >60)
GFR, EST NON AFRICAN AMERICAN: 55 mL/min — AB (ref >60)
GLUCOSE: 139 mg/dL — AB (ref 65–99)
Potassium: 3.9 mmol/L (ref 3.5–5.1)
Sodium: 139 mmol/L (ref 135–145)

## 2015-12-15 LAB — MAGNESIUM: Magnesium: 1.7 mg/dL (ref 1.7–2.4)

## 2015-12-15 MED ORDER — PREDNISONE 20 MG PO TABS: 60.0000 mg | ORAL_TABLET | Freq: Every day | ORAL | 0 refills | Status: DC

## 2015-12-15 MED ORDER — TRAMADOL HCL 50 MG PO TABS: 50.0000 mg | ORAL_TABLET | Freq: Three times a day (TID) | ORAL | 0 refills | Status: DC | PRN

## 2015-12-15 MED ORDER — CARVEDILOL 3.125 MG PO TABS: 3.1250 mg | ORAL_TABLET | Freq: Two times a day (BID) | ORAL | 0 refills | Status: DC

## 2015-12-15 MED ORDER — CARVEDILOL 3.125 MG PO TABS: 3.1250 mg | ORAL_TABLET | Freq: Two times a day (BID) | ORAL | Status: DC

## 2015-12-15 MED ORDER — CALCIUM CARBONATE ANTACID 500 MG PO CHEW: 1.0000 | CHEWABLE_TABLET | Freq: Two times a day (BID) | ORAL | Status: DC | PRN

## 2015-12-15 MED ORDER — MEXILETINE HCL 200 MG PO CAPS: 200.0000 mg | ORAL_CAPSULE | Freq: Three times a day (TID) | ORAL | 0 refills | Status: DC

## 2015-12-15 MED ORDER — APIXABAN 5 MG PO TABS: 5.0000 mg | ORAL_TABLET | Freq: Two times a day (BID) | ORAL | 6 refills | Status: DC

## 2015-12-15 MED ORDER — LEVALBUTEROL HCL 0.63 MG/3ML IN NEBU: 0.6300 mg | INHALATION_SOLUTION | Freq: Four times a day (QID) | RESPIRATORY_TRACT | 12 refills | Status: DC | PRN

## 2015-12-15 MED ORDER — LOSARTAN POTASSIUM 25 MG PO TABS: 12.5000 mg | ORAL_TABLET | Freq: Every day | ORAL | 0 refills | Status: DC

## 2015-12-15 MED ORDER — FUROSEMIDE 20 MG PO TABS: 20.0000 mg | ORAL_TABLET | ORAL | 0 refills | Status: DC

## 2015-12-15 MED ORDER — ALPRAZOLAM 0.25 MG PO TABS: 0.2500 mg | ORAL_TABLET | Freq: Three times a day (TID) | ORAL | 0 refills | Status: DC | PRN

## 2015-12-15 NOTE — Progress Notes (Signed)
Occupational Therapy Discharge Summary  Patient Details  Name: [name] MRN: [id number] Date of Birth: 06-09-1945  Today's Date: 12/15/2015 OT Individual Time: 2197-5883 OT Individual Time Calculation (min): 60 min   Patient has met 8 of 8 long term goals due to improved activity tolerance, improved balance and ability to compensate for deficits.  Patient to discharge at overall Supervision level.  Patient's care partner is independent to provide the necessary physical assistance at discharge.    Reasons goals not met: n/a  Recommendation:  Patient will benefit from ongoing skilled OT services in home health setting to continue to advance functional skills in the area of BADL and Reduce care partner burden.  Equipment: short rolling walker and transport chair  Reasons for discharge: treatment goals met  Patient/family agrees with progress made and goals achieved: Yes   Skilled therapeutic Interventions 1:1 OT session focused on pt/family education, home safety modifications, and activity tolerance. Pt up sitting in w/c upon OT arrival reporting improved medical status this afternoon. OT discussed pt's readiness for dc home, energy conservation techniques, oxygen needs, and activity modifications within home environment. Pt/spouse verbalize understanding and collaborated with therapist on home environment set-up for safe ADL participation. SpO2 97 on 8L at rest in w/c. Pt ambulated 25 feet w/ RW, pt desat to 82, but recovered quickly with seated rest break. OT took orthostatic pressures from sit<>stand.   Pt w/ asymptomatic orthostatic hypotension. BP dropped from 97/68  To 75/52 with sit<>stand-representing more than 20 mm Hg  Systolic drop and more than 10 mm Hg diastolic drop. BP recovered in sitting.   Educated pt/family on why pt requires increased O2 support during activity and pt/spouse verbalized understanding. Pt;s spouse explained to OT that she would feel more  comfortable for pt to stay 1 extra day given orthostatic episode this am. OT informed PA , who was in agreeance and discussed this with pt and spouse. [name] Pt able to complete all ADLs at overall supervision level with 8L of O2.    OT Discharge Precautions/Restrictions Precautions Precautions: Fall Precaution Comments: Monitor 02 and BP Restrictions Weight Bearing Restrictions: No General OT Amount of Missed Time: 15 Minutes PT Missed Treatment Reason: Patient fatigue Vital Signs Therapy Vitals Temp: 97.7 F (36.5 C) Temp Source: Oral Pulse Rate: 87 Resp: 18 BP: 97/68 Patient Position (if appropriate): Sitting Oxygen Therapy SpO2: 88 %- after ambulation O2 Device: Nasal Cannula O2 Flow Rate (L/min): 8 L/min Pain Pain Assessment Pain Assessment: No/denies pain ADL ADL Grooming: Supervision/safety Upper Body Bathing: Supervision/safety Lower Body Bathing: Supervision/safety Upper Body Dressing: Supervision/safety Lower Body Dressing: Supervision/safety Toileting: Supervision/safety Toilet Transfer: Close supervision Social research officer, government: Close supervision ADL Comments: please see functional navigator  Cognition Overall Cognitive Status: Within Functional Limits for tasks assessed Arousal/Alertness: Awake/alert Orientation Level: Oriented X4 Memory: Appears intact Safety/Judgment: Appears intact Sensation Sensation Light Touch: Appears Intact Stereognosis: Appears Intact Hot/Cold: Appears Intact Proprioception: Appears Intact Coordination Gross Motor Movements are Fluid and Coordinated: No Fine Motor Movements are Fluid and Coordinated: Yes Coordination and Movement Description: generalized weakness and deconditioning Motor  Motor Motor: Within Functional Limits Balance Dynamic Standing Balance Dynamic Standing - Level of Assistance: 5: Stand by assistance Extremity/Trunk Assessment RUE Assessment RUE Assessment: Within Functional Limits LUE  Assessment LUE Assessment: Within Functional Limits   See Function Navigator for Current Functional Status.  Daneen Schick Yvette Roark 12/15/2015, 3:48 PM

## 2015-12-15 NOTE — Discharge Summary (Signed)
Physician Discharge Summary  Patient ID: [name] MRN: [id number] DOB/AGE: 1945-07-01 70 y.o.  Admit date: 12/06/2015 Discharge date: 12/17/2015  Discharge Diagnoses:  Principal Problem:   Physical debility Active Problems:   AICD (automatic cardioverter/defibrillator) present   Amiodarone toxicity   Anemia of chronic disease   Chronic systolic CHF (congestive heart failure) (HCC)   History of ventricular fibrillation   PAF (paroxysmal atrial fibrillation) (HCC)   Supplemental oxygen dependent   Hypoalbuminemia due to protein-calorie malnutrition (HCC)   Postcholecystectomy diarrhea   Coronary artery disease involving native coronary artery with angina pectoris (HCC)   Acute pulmonary edema (HCC)   Hypoxemia   Pleural effusion   AKI (acute kidney injury) (HCC)   Discharged Condition: stable.   Significant Diagnostic Studies: Dg Chest 2 View  Result Date: 12/13/2015 CLINICAL DATA:  Short of breath since yesterday EXAM: CHEST  2 VIEW COMPARISON:  12/05/2015 FINDINGS: Left subclavian AICD device is stable. Cardiomegaly is stable. Bilateral airspace disease has improved throughout the left lung and is stable in the right lung. No pneumothorax. No pleural effusion. IMPRESSION: Bilateral airspace disease is improved on the left and stable on the right. These findings suggest improving CHF and pulmonary edema. Electronically Signed   By: Jolaine Click M.D.   On: 12/13/2015 11:17    Ct Angio Chest Pe W Or Wo Contrast  Result Date: 12/14/2015 CLINICAL DATA:  Dyspnea with elevated D-dimer. No history of cancer or chest pain. EXAM: CT ANGIOGRAPHY CHEST WITH CONTRAST TECHNIQUE: Multidetector CT imaging of the chest was performed using the standard protocol during bolus administration of intravenous contrast. Multiplanar CT image reconstructions and MIPs were obtained to evaluate the vascular anatomy. CONTRAST:  47 cc of Isovue 370 IV COMPARISON:  11/17/2015 FINDINGS: Cardiovascular:  Satisfactory opacification of the pulmonary arteries to the segmental level. No evidence of pulmonary embolism. Stable cardiomegaly without pericardial effusion. LAD and circumflex coronary arteriosclerosis. Aortic atherosclerosis without aneurysm. Mediastinum/Nodes: The tracheobronchial tree and esophagus are unremarkable. Right paratracheal lymph node measuring 15 mm short axis is currently noted versus 12 mm previously, likely reactive. A few calcified nodes are seen in the right hilum consistent with old granulomatous disease. Stable sub cm appearing thyroid nodules as before. Lungs/Pleura: There is bilateral centrilobular emphysema with subpleural blebs. Mild bronchiectasis to the right middle lobe with superimposed areas of nonspecific ground-glass opacity are noted bilaterally within all lobes. These may represent areas secondary to CHF, hypoventilatory change or potentially areas of infection or inflammation among some considerations though not exclusive. Patchy airspace opacity seen previously posteriorly in the left lower lobe is less prominent with more focal consolidation seen in the right lower lobe posteriorly on current exam. The previously noted irregular nodule in the right upper lobe seen on prior comparison is obscured by new ground-glass opacities and emphysematous change. Upper Abdomen: Normal appearing visualized adrenal glands bilaterally. Cholecystectomy. No space-occupying mass of the liver. The visualized pancreas and spleen are unremarkable. Musculoskeletal: No acute osseous abnormality. Mild degenerative endplate changes along the upper thoracic spine. Review of the MIP images confirms the above findings. IMPRESSION: Insert 1. No acute pulmonary embolus. 2. Bilateral centrilobular emphysema with subpleural blebs, right middle lobe mild bronchiectasis and nonspecific bilateral ground-glass opacities of the lungs, possibly sequela of CHF, pneumonitis/alveolitis and/or hypoventilatory change  among some considerations though not exclusive. 3. 4 mm right upper lobe nodular density is obscured by a new areas of ground-glass opacity and centrilobular emphysema. Electronically Signed   By: Rene Kocher.D.  On: 12/14/2015 18:03    Dg Chest Port 1 View  Result Date: 12/15/2015 CLINICAL DATA:  Acute respiratory failure with hypoxia. History of COPD, atrial fibrillation, recently discontinued smoking. EXAM: PORTABLE CHEST 1 VIEW COMPARISON:  PA and lateral chest x-ray of December 13, 2015 and May 25, 2015 FINDINGS: The lungs are well-expanded. The interstitial markings remain increased diffusely. There is no significant pleural effusion. The cardiac silhouette remains enlarged. The pulmonary vascularity is not clearly engorged. The ICD is in stable position. The bony thorax exhibits no acute abnormality. There is chronic widening of the right AC joint. IMPRESSION: Persistent diffusely increased interstitial markings consistent with interstitial edema. There has not been significant interval change since yesterday's study. Stable cardiomegaly. Electronically Signed   By: David  Swazil[place]Jordan M.D.   On: 12/15/2015 07:30     Labs:  Basic Metabolic Panel:  Recent Labs Lab 12/12/15 0225 12/12/15 2328 12/13/15 0755 12/14/15 0537 12/15/15 0702 12/16/15 0432  NA 138 137  --  139 139 137  K 4.3 4.2  --  3.6 3.9 4.3  CL 103 103  --  103 98* 100*  CO2 28 25  --  28 30 28   GLUCOSE 100* 132*  --  99 139* 102*  BUN 30* 26*  --  32* 33* 36*  CREATININE 0.89 0.87  --  1.07 1.27* 1.08  CALCIUM 8.0* 7.9*  --  8.0* 7.9* 8.4*  MG 1.7  --  1.9  --  1.7  --   PHOS  --   --   --   --  3.6  --     CBC:  Recent Labs Lab 12/12/15 0225 12/15/15 0958  WBC 19.0* 17.3*  HGB 9.9* 11.3*  HCT 32.0* 36.6*  MCV 87.0 88.2  PLT 389 358    CBG: No results for input(s): GLUCAP in the last 168 hours.  Brief HPI:   Randy LangoRichard L Bosleyis a 70 y.o.malewith history of CAD, A Fib, ICM s/p AICD, COPD, anxiety  disorder, GIB, multiple hospitalizations for HCAP in the past few months --most recent for HCAP requiring discharge to home on oxygen. He was readmitted on 11/28/15 with ongoing hypoxia and progressive diffuse bilateral infiltrates. Dr. Tyson AliasFeinstein felt that infiltrates inflammatory in nature as RA and anti-CCP weakly positive v/s toxicity from amiodarone.   Amiodarone discontinued but he developed VT which accelerated to VF with appropriate ICD therapy. His device was reprogrammed and he was started on Mexiletine for VT suppression. He continued to require high flow oxygen due to persistent hypoxemia and broad spectrum antibiotics due to possibility of PNA. Antibiotics were discontinued 10/28 as he had improved with initiation of high dose steroids. This was decreased to 60 mg daily at discharge with recommendations to taper by 10 mg every 2 weeks and to  continue to wean Fio2 for saturation 88- 92%.  He was weaned down to 5 liters at rest but required 8-10 liters with therapy. He was noted to be significantly debilitated and CIR was recommended for follow up therapy.    Hospital Course: [name] was admitted to rehab 12/06/2015 for inpatient therapies to consist of PT and OT at least three hours five days a week. Past admission physiatrist, therapy team and rehab RN have worked together to provide customized collaborative inpatient rehab. His endurance has and respiratory status have slowly improved. He was noted to desaturate down to low 70's with activity and required 10-12 liters of high flow oxygen with mobility. Pulmonary was consulted for  input and recommended continuing 5-6 liters at rest and pretreating with 10 L for 30-45 minutes prior to any activity.  He did have worsening of symptoms on 11/8 and which was felt to be due to fluid overload per Dr. Molli Knock.  Chest CT was negative for PE. He was diuresed with three dose of IV lasix with resultant acute renal insufficiency and episode of syncope.   Pulmonary recommended discontinuing high flow oxygen and to continue attempts at taper. He was advised to increase fluid intake with improvement in renal status. Discharge was delayed by 24 hours for monitoring and no further presyncope/syncopal episodes.   Cardiology was also consulted for input and recommends continuing lasix at 20 mg MWF and patient/wife were educated on monitoring weights daily with additional dose prn weigh gain or holding dose as needed prn weight loss or lightheadedness.  Nutritional supplements were added to help with low calorie malnutrition. Reactive leucocytosis is resolving and he continues on 60 mg prednisone at this time. He is to follow up with pulmonary next week for input on taper.   Follow up CBC shows anemia of chronic illness to be improving with rise in Hgb to 11.3. Heart rate is controlled and in NSR. Rehab team has provided ego support and his anxiety levels have been controlled. No reports of neck or back pain noted with increase in activity. Patient and wife were educated on not using or limiting use of skelaxin and hydrocodone due to respiratory suppression side effects.  He has had improvement in activity tolerance and is able to ambulate household distances with supervision and requires up to 10 L of oxygen with activity. He was noted to desaturate to 77-83% on 4 L with activity and required prolonged restbreak with increase in oxygen to 8 L to recover to > 90%. Family education was completed with wife regarding all aspects of care.  He will continue to receive follow up HHPT, HHOT and HHRN by [facility] after discharge. He will continue to require 24 hours supervision after discharge.    Rehab course: During patient's stay in rehab weekly team conferences were held to monitor patient's progress, set goals and discuss barriers to discharge. At admission, patient required min assist with basic self care tasks and min assist with mobility. He has had  improvement in activity tolerance, balance, postural control, as well as ability to compensate for deficits. He is able to complete ADL task with supervision. He is modified independent for transfers and is able to ambulate 1' with RW and supervision. he is able to navigate 4 stairs with min assist.    Disposition: Home  Diet: Heart healthy/Low salt  Special Instructions: 1. Pulmonary to instruct on steroid taper which should be starting around 11/14.  2. Walk at modified pace, take multiple rest breaks and increase oxygen to 10 liters 45 minutes prior to ambulation.  3.  Repeat immune serologies to rule out auto-immune etiology 4.  Use heat/ice and Sportscreme as to neck and back prn as alternative to narcotics/muscle relaxer's.       Medication List    STOP taking these medications   diazepam 5 MG tablet Commonly known as:  VALIUM   HYDROcodone-acetaminophen 5-325 MG tablet Commonly known as:  NORCO/VICODIN   levalbuterol 1.25 MG/0.5ML nebulizer solution Commonly known as:  XOPENEX Replaced by:  levalbuterol 0.63 MG/3ML nebulizer solution   promethazine 25 MG tablet Commonly known as:  PHENERGAN   traMADol 50 MG tablet Commonly known as:  [name]  TAKE these medications   acetaminophen 325 MG tablet Commonly known as:  TYLENOL Take 2 tablets (650 mg total) by mouth every 6 (six) hours as needed for mild pain (or Fever >/= 101).   ALPRAZolam 0.25 MG tablet Commonly known as:  XANAX Take 1 tablet (0.25 mg total) by mouth 3 (three) times daily as needed for anxiety.   apixaban 5 MG Tabs tablet Commonly known as:  ELIQUIS Take 1 tablet (5 mg total) by mouth 2 (two) times daily. Resume 09/29/15   aspirin 81 MG EC tablet Take 1 tablet (81 mg total) by mouth daily.   benzonatate 200 MG capsule Commonly known as:  TESSALON Take 1 capsule (200 mg total) by mouth every 4 (four) hours as needed for cough.   calcium carbonate 500 MG chewable tablet Commonly known as:   TUMS - dosed in mg elemental calcium Chew 1 tablet (200 mg of elemental calcium total) by mouth 2 (two) times daily as needed for indigestion or heartburn.   carvedilol 3.125 MG tablet Commonly known as:  COREG Take 1 tablet (3.125 mg total) by mouth 2 (two) times daily with a meal.   donepezil 10 MG tablet Commonly known as:  ARICEPT Take 1 tablet (10 mg total) by mouth at bedtime.   feeding supplement Liqd Take 1 Container by mouth 3 (three) times daily between meals.   fexofenadine-pseudoephedrine 180-240 MG 24 hr tablet Commonly known as:  ALLEGRA-D 24 Take 1 tablet by mouth daily.   furosemide 20 MG tablet Commonly known as:  LASIX Take 1 tablet (20 mg total) by mouth every Monday, Wednesday, and Friday.   guaiFENesin 600 MG 12 hr tablet Commonly known as:  MUCINEX Take by mouth 2 (two) times daily.   levalbuterol 0.63 MG/3ML nebulizer solution Commonly known as:  XOPENEX Take 3 mLs (0.63 mg total) by nebulization every 6 (six) hours as needed for wheezing or shortness of breath. Replaces:  levalbuterol 1.25 MG/0.5ML nebulizer solution   loperamide 2 MG capsule Commonly known as:  IMODIUM Take 1 capsule (2 mg total) by mouth as needed for diarrhea or loose stools.   losartan 25 MG tablet Commonly known as:  COZAAR Take 0.5 tablets (12.5 mg total) by mouth daily.   metaxalone 800 MG tablet Commonly known as:  SKELAXIN TAKE 1 TABLET BY MOUTH 3 TIMES A DAY AS NEEDED FOR PAIN. What changed:  how much to take  how to take this  when to take this  reasons to take this  additional instructions   mexiletine 200 MG capsule Commonly known as:  MEXITIL Take 1 capsule (200 mg total) by mouth every 8 (eight) hours.   MULTIVITAMIN ADULTS 50+ Tabs Take 1 tablet by mouth daily.   nitroGLYCERIN 0.4 MG SL tablet Commonly known as:  NITROSTAT PLACE 1 TABLET (0.4 MG TOTAL) UNDER THE TONGUE EVERY 5 (FIVE) MINUTES AS NEEDED. FOR CHEST PAIN.   predniSONE 20 MG  tablet Commonly known as:  DELTASONE Take 3 tablets (60 mg total) by mouth daily with breakfast.   ranitidine 150 MG capsule Commonly known as:  ZANTAC Take 150 mg by mouth daily as needed for heartburn.   ranolazine 500 MG 12 hr tablet Commonly known as:  RANEXA Take 1 tablet (500 mg total) by mouth 2 (two) times daily.   zolpidem 5 MG tablet Commonly known as:  AMBIEN Take 1 tablet (5 mg total) by mouth at bedtime as needed for sleep.      Follow-up Information  Bonnee Zertuche Karis Juba, MD Follow up.   Specialty:  Physical Medicine and Rehabilitation Why:  office to call you with follow up appointment Contact information: [address] [place] [address] [phone]        Lewayne Bunting, MD Follow up on 12/22/2015.   Specialty:  Cardiology Why:  at 1 pm for post hospital follow up. Please bring all of your medications to your appointment.  Contact information: [address] [place] [address] [phone]        Rubye Oaks, NP Follow up on 12/19/2015.   Specialty:  Pulmonary Disease Why:  appointment at 11:45 am Contact information: [address] [place] [address] [phone]        Crawford Givens, MD Follow up on 12/25/2015.   Specialty:  Family Medicine Why:  Appointment @ 12:15 pm Contact information: [address] [place] [address] [phone]        Arvilla Meres, MD Follow up.   Specialty:  Cardiology Why:  Can follow up with HF clinic as needed.  Contact information: [address] [place] [address] [phone]           Signed: Jacquelynn Cree 12/18/2015, 12:17 PM

## 2015-12-15 NOTE — Progress Notes (Signed)
SATURATION QUALIFICATIONS: (This note is used to comply with regulatory documentation for home oxygen)  Patient Saturations on Room Air at Rest = 86%  Patient Saturations on Room Air while Ambulating = 81%  Patient Saturations on 8 Liters of oxygen while Ambulating = 88-90%  Please briefly explain why patient needs home oxygen: Pt has multiple medical issues-CAD,COPD,HCAP,A-FIB and Amiodarone toxicity. Requiring 8-10 liters of O2 to maintain his sats in the 90's.

## 2015-12-15 NOTE — Progress Notes (Signed)
Paged earlier today with syncopal episode. Pt was getting in shower and says when cold water he "tensed up" and felt very lightheaded and passed out.     Became pale, and ? Diaphoresis with being in shower.   Pt received IV lasix 40 mg x 3 since yesterday evening. Hold all further diuresis today.   Can give 500 cc NS after lunch if pressures remain soft.  Pt feeling much better, and tolerating po, so suspect he will not need.   HF team will see again in am prior to d/c.  At this time, Lasix 20 mg M/W/F still appropriate.  Pt can take extra 20 mg for weight gain, and hold as needed for weight loss/lightheadedness.   Pt has close follow up with primary card 12/22/15.  Can see [facility] as needed.   Casimiro Needle [address] [place], PA-C 12/15/2015 1:10 PM

## 2015-12-15 NOTE — Progress Notes (Signed)
Emergently called to room by OT stating patient is "passing out."  Upon entering room, patient was noted to be sitting on the shower chair, very pale, minimally responsive to verbal commands.  According to OT report patient didn't even get started with shower, only turned water on for a second when episode happened.  Assisted patient back to bed.  VSS.  Oxygen levels stable.  Notified Marissa Nestle, PA who ordered EKG and labs.  Patient is recovering in bed.  Encouraged to drink fluids as episode could be caused by dehydration related to IV diuresis. Will continue to monitor patient closely.  Dani Gobble, RN

## 2015-12-15 NOTE — Progress Notes (Signed)
Name: [name] MRN: [id number] DOB: Sep 23, 1945    ADMISSION DATE:  12/06/2015 CONSULTATION DATE: 11/8  REFERRING MD : Rehab  CHIEF COMPLAINT:  SOB  BRIEF PATIENT DESCRIPTION: Frail thin WM  SIGNIFICANT EVENTS    STUDIES:     HISTORY OF PRESENT ILLNESS:   70 yo with PAF/CAD/AICD/EF 30% recent treatment x 2 for pna in October 2017 with 2 admissions. Amiodarone toxicty stopped in October. Admitted to [facility] for AICD firing x 2 on 10/24 along with FTT. He was discharged 11/1 to rehab for being frail ans weak. On 11/7 he was more sob and he has O2 increased to 8 l/m. PCCM asked to evaluate on 11/8.     SUBJECTIVE:  NAD at rest  VITAL SIGNS: Temp:  [97.6 F (36.4 C)-98.1 F (36.7 C)] 97.6 F (36.4 C) (11/10 9741) Pulse Rate:  [72-79] 72 (11/10 0953) Resp:  [15-22] 22 (11/10 0953) BP: (93-116)/(57-78) 93/57 (11/10 0953) SpO2:  [91 %-96 %] 96 % (11/10 0953) Weight:  [132 lb 7.9 oz (60.1 kg)] 132 lb 7.9 oz (60.1 kg) (11/10 6384)  PHYSICAL EXAMINATION: General:  Frail, nervous male, reports being better Neuro: Poor memory, follows commands HEENT:  No JVD/LAn Cardiovascular:  HSR Lungs:  Bibasilar crackles, improved, 8 l/m fio2 Abdomen: soft +_bs Musculoskeletal: muscle wasting Skin:  Warm and dry   Recent Labs Lab 12/12/15 2328 12/14/15 0537 12/15/15 0702  NA 137 139 139  K 4.2 3.6 3.9  CL 103 103 98*  CO2 25 28 30   BUN 26* 32* 33*  CREATININE 0.87 1.07 1.27*  GLUCOSE 132* 99 139*    Recent Labs Lab 12/11/15 0926 12/12/15 0225 12/15/15 0958  HGB 9.7* 9.9* 11.3*  HCT 31.3* 32.0* 36.6*  WBC 18.2* 19.0* 17.3*  PLT 382 389 358   Ct Angio Chest Pe W Or Wo Contrast  Result Date: 12/14/2015 CLINICAL DATA:  Dyspnea with elevated D-dimer. No history of cancer or chest pain. EXAM: CT ANGIOGRAPHY CHEST WITH CONTRAST TECHNIQUE: Multidetector CT imaging of the chest was performed using the standard protocol during bolus administration of intravenous  contrast. Multiplanar CT image reconstructions and MIPs were obtained to evaluate the vascular anatomy. CONTRAST:  47 cc of Isovue 370 IV COMPARISON:  11/17/2015 FINDINGS: Cardiovascular: Satisfactory opacification of the pulmonary arteries to the segmental level. No evidence of pulmonary embolism. Stable cardiomegaly without pericardial effusion. LAD and circumflex coronary arteriosclerosis. Aortic atherosclerosis without aneurysm. Mediastinum/Nodes: The tracheobronchial tree and esophagus are unremarkable. Right paratracheal lymph node measuring 15 mm short axis is currently noted versus 12 mm previously, likely reactive. A few calcified nodes are seen in the right hilum consistent with old granulomatous disease. Stable sub cm appearing thyroid nodules as before. Lungs/Pleura: There is bilateral centrilobular emphysema with subpleural blebs. Mild bronchiectasis to the right middle lobe with superimposed areas of nonspecific ground-glass opacity are noted bilaterally within all lobes. These may represent areas secondary to CHF, hypoventilatory change or potentially areas of infection or inflammation among some considerations though not exclusive. Patchy airspace opacity seen previously posteriorly in the left lower lobe is less prominent with more focal consolidation seen in the right lower lobe posteriorly on current exam. The previously noted irregular nodule in the right upper lobe seen on prior comparison is obscured by new ground-glass opacities and emphysematous change. Upper Abdomen: Normal appearing visualized adrenal glands bilaterally. Cholecystectomy. No space-occupying mass of the liver. The visualized pancreas and spleen are unremarkable. Musculoskeletal: No acute osseous abnormality. Mild degenerative endplate changes  along the upper thoracic spine. Review of the MIP images confirms the above findings. IMPRESSION: Insert 1. No acute pulmonary embolus. 2. Bilateral centrilobular emphysema with  subpleural blebs, right middle lobe mild bronchiectasis and nonspecific bilateral ground-glass opacities of the lungs, possibly sequela of CHF, pneumonitis/alveolitis and/or hypoventilatory change among some considerations though not exclusive. 3. 4 mm right upper lobe nodular density is obscured by a new areas of ground-glass opacity and centrilobular emphysema. Electronically Signed   By: Tollie Ethavid  Kwon M.D.   On: 12/14/2015 18:03   Dg Chest Port 1 View  Result Date: 12/15/2015 CLINICAL DATA:  Acute respiratory failure with hypoxia. History of COPD, atrial fibrillation, recently discontinued smoking. EXAM: PORTABLE CHEST 1 VIEW COMPARISON:  PA and lateral chest x-ray of December 13, 2015 and May 25, 2015 FINDINGS: The lungs are well-expanded. The interstitial markings remain increased diffusely. There is no significant pleural effusion. The cardiac silhouette remains enlarged. The pulmonary vascularity is not clearly engorged. The ICD is in stable position. The bony thorax exhibits no acute abnormality. There is chronic widening of the right AC joint. IMPRESSION: Persistent diffusely increased interstitial markings consistent with interstitial edema. There has not been significant interval change since yesterday's study. Stable cardiomegaly. Electronically Signed   By: David  SwazilandJordan M.D.   On: 12/15/2015 07:30    Intake/Output Summary (Last 24 hours) at 12/15/15 1125 Last data filed at 12/15/15 0915  Gross per 24 hour  Intake              360 ml  Output             2200 ml  Net            -1840 ml    ASSESSMENT  Active Problems:    Respiratory distress   Supplemental oxygen dependent   Physical debility   Amiodarone toxicity on steroids   Anemia of chronic disease   Chronic systolic CHF (congestive heart failure) (HCC)   Diarrhea   History of GI bleed   History of ventricular fibrillation   Lymphocytosis   PAF (paroxysmal atrial fibrillation) (HCC)    Hypoalbuminemia due to  protein-calorie malnutrition (HCC)   Unstable angina pectoris (HCC)   Postcholecystectomy diarrhea   Coronary artery disease involving native coronary artery with angina pectoris [facility](HCC)   Discussion: 70 yo with PAF/CAD/AICD/EF 30% recent treatment x 2 for pna in October 2017 with 2 admissions. Amiodarone toxicty stopped in October. [facility] for AICD firing x 2 on 10/24 along with FTT. He was discharged 11/1 to rehab for being frail ans weak. On 11/7 he was more sob and he has O2 increased to 8 l/m. PCCM asked to evaluate on 11/8. 11/9 diuresed 1000 cc for 11/9. Still on high flow O2. On prednisone 60 for amio reaction. 11/10 neg 1800 still sob and on high flow O2 and now dizzy and hypotensive 11/10    PLAN: - - Consider CT chest PE protocol to ro PE. this will also document changes to lung process and ro pe. - Diuresis, 11/10 I suspect he is now intravascular depleted with low bp and dizziness - O2 as needed - Decreased xopenex to proper dose. - Cards following - Control anxiety - He may be end stage - Consider LTAC for steroid wean and close observation.  Brett CanalesSteve Minor ACNP [facility] PCCM Pager [phone] till 3 pm If no answer page [phone]  Attending Note:  70 year old male with PMH of CHF and amiodarone toxicity who  has been off amiodarone since October presenting with worsening hypoxemic respiratory failure. On exam, crackles at the bases with no wheezing. I reviewed chest CT myself, no PE noted, 4 mm pulmonary nodular density in right upper lobe and emphysema noted. Discussed with PCCM-NP.  Acute hypoxemic respiratory failure: - Supplemental O2.  - D/C HFNC and switch to regular cannula (communicated with RT), down to 4L [place]  - Plan to go home over the weekend. - Titrate O2 for sat of 88-92%.  Acute pulmonary edema: - Hold further lasix for now             - Repeat CXR in AM - F/U BMET in AM.  Pulmonary fibrosis:  suspect due to previous amiodarone toxicity. - Continue to hold amiodarone. - No role for steroids at this time.  - Suspect this is becoming an end-stage process.  Obstructive lung disease: - Adjusted xopenex.  Pulmonary nodular density:  - F/U CT in 3 months.  PCCM will sign off, please call back if needed.  Patient seen and examined, agree with above note. I dictated the care and orders written for this patient under my direction.  Alyson Reedy, MD [phone]

## 2015-12-15 NOTE — Telephone Encounter (Signed)
Sharyne Peach RN case mgr with [facility] left v/m requesting verbal orders for home health skilled nursing, PT and OT. Pt was admitted to [facility] on 11/27/15 but then was readmitted to hospital; pt is to be discharged on 12/16/15 and Arline Asp wanted verbal orders for [facility] and to make sure Dr Para March would be the attending to sign all orders. Cindy request cb.

## 2015-12-15 NOTE — Discharge Instructions (Signed)
Inpatient Rehab Discharge Instructions  [name] Discharge date and time:  12/16/15  Activities/Precautions/ Functional Status: Activity: no lifting, driving, or strenuous exercise  till cleared by MD Diet: cardiac diet Wound Care: none needed   Functional status:  ___ No restrictions     ___ Walk up steps independently _X__ 24/7 supervision/assistance   ___ Walk up steps with assistance ___ Intermittent supervision/assistance  ___ Bathe/dress independently ___ Walk with walker     _X__ Bathe/dress with supervision ___ Walk Independently    ___ Shower independently ___ Walk with assistance    ___ Shower with assistance _X__ No alcohol     ___ Return to work/school ________  Special Instructions:    COMMUNITY REFERRALS UPON DISCHARGE:    Home Health:   PT, OT, RN  Agency:[facility] Phone:[phone]   Date of last service:12/16/2015  Medical Equipment/Items Ordered:[name] & TRANSPORT CHAIR  Agency/Supplier:[facility]   [phone] Other:[facility]  TO FOLLOW UPON DISCHARGE    My questions have been answered and I understand these instructions. I will adhere to these goals and the provided educational materials after my discharge from the hospital.  Patient/Caregiver Signature _______________________________ Date __________  Clinician Signature _______________________________________ Date __________  Please bring this form and your medication list with you to all your follow-up doctor's appointments.

## 2015-12-15 NOTE — Progress Notes (Signed)
[facility] PHYSICAL MEDICINE & REHABILITATION     PROGRESS NOTE  Subjective/Complaints:  Pt sitting up in bed this AM.  He states he slept "great" and is doing better. Evaluated by Pulm and Cards yesterday.    ROS: +DOE. Denies CP, N/V/D.  Objective: Vital Signs: Blood pressure 116/65, pulse 72, temperature 97.6 F (36.4 C), temperature source Oral, resp. rate 18, height 5\' 3"  (1.6 m), weight 60.1 kg (132 lb 7.9 oz), SpO2 94 %. Dg Chest 2 View  Result Date: 12/13/2015 CLINICAL DATA:  Short of breath since yesterday EXAM: CHEST  2 VIEW COMPARISON:  12/05/2015 FINDINGS: Left subclavian AICD device is stable. Cardiomegaly is stable. Bilateral airspace disease has improved throughout the left lung and is stable in the right lung. No pneumothorax. No pleural effusion. IMPRESSION: Bilateral airspace disease is improved on the left and stable on the right. These findings suggest improving CHF and pulmonary edema. Electronically Signed   By: Jolaine ClickArthur  Hoss M.D.   On: 12/13/2015 11:17   Ct Angio Chest Pe W Or Wo Contrast  Result Date: 12/14/2015 CLINICAL DATA:  Dyspnea with elevated D-dimer. No history of cancer or chest pain. EXAM: CT ANGIOGRAPHY CHEST WITH CONTRAST TECHNIQUE: Multidetector CT imaging of the chest was performed using the standard protocol during bolus administration of intravenous contrast. Multiplanar CT image reconstructions and MIPs were obtained to evaluate the vascular anatomy. CONTRAST:  47 cc of Isovue 370 IV COMPARISON:  11/17/2015 FINDINGS: Cardiovascular: Satisfactory opacification of the pulmonary arteries to the segmental level. No evidence of pulmonary embolism. Stable cardiomegaly without pericardial effusion. LAD and circumflex coronary arteriosclerosis. Aortic atherosclerosis without aneurysm. Mediastinum/Nodes: The tracheobronchial tree and esophagus are unremarkable. Right paratracheal lymph node measuring 15 mm short axis is currently noted versus 12 mm previously, likely  reactive. A few calcified nodes are seen in the right hilum consistent with old granulomatous disease. Stable sub cm appearing thyroid nodules as before. Lungs/Pleura: There is bilateral centrilobular emphysema with subpleural blebs. Mild bronchiectasis to the right middle lobe with superimposed areas of nonspecific ground-glass opacity are noted bilaterally within all lobes. These may represent areas secondary to CHF, hypoventilatory change or potentially areas of infection or inflammation among some considerations though not exclusive. Patchy airspace opacity seen previously posteriorly in the left lower lobe is less prominent with more focal consolidation seen in the right lower lobe posteriorly on current exam. The previously noted irregular nodule in the right upper lobe seen on prior comparison is obscured by new ground-glass opacities and emphysematous change. Upper Abdomen: Normal appearing visualized adrenal glands bilaterally. Cholecystectomy. No space-occupying mass of the liver. The visualized pancreas and spleen are unremarkable. Musculoskeletal: No acute osseous abnormality. Mild degenerative endplate changes along the upper thoracic spine. Review of the MIP images confirms the above findings. IMPRESSION: Insert 1. No acute pulmonary embolus. 2. Bilateral centrilobular emphysema with subpleural blebs, right middle lobe mild bronchiectasis and nonspecific bilateral ground-glass opacities of the lungs, possibly sequela of CHF, pneumonitis/alveolitis and/or hypoventilatory change among some considerations though not exclusive. 3. 4 mm right upper lobe nodular density is obscured by a new areas of ground-glass opacity and centrilobular emphysema. Electronically Signed   By: Tollie Ethavid  Kwon M.D.   On: 12/14/2015 18:03   Dg Chest Port 1 View  Result Date: 12/15/2015 CLINICAL DATA:  Acute respiratory failure with hypoxia. History of COPD, atrial fibrillation, recently discontinued smoking. EXAM: PORTABLE  CHEST 1 VIEW COMPARISON:  PA and lateral chest x-ray of December 13, 2015 and May 25, 2015  FINDINGS: The lungs are well-expanded. The interstitial markings remain increased diffusely. There is no significant pleural effusion. The cardiac silhouette remains enlarged. The pulmonary vascularity is not clearly engorged. The ICD is in stable position. The bony thorax exhibits no acute abnormality. There is chronic widening of the right AC joint. IMPRESSION: Persistent diffusely increased interstitial markings consistent with interstitial edema. There has not been significant interval change since yesterday's study. Stable cardiomegaly. Electronically Signed   By: David  [place]aziland M.D.   On: 12/15/2015 07:30   No results for input(s): WBC, HGB, HCT, PLT in the last 72 hours.  Recent Labs  12/14/15 0537 12/15/15 0702  NA 139 139  K 3.6 3.9  CL 103 98*  GLUCOSE 99 139*  BUN 32* 33*  CREATININE 1.07 1.27*  CALCIUM 8.0* 7.9*   CBG (last 3)  No results for input(s): GLUCAP in the last 72 hours.  Wt Readings from Last 3 Encounters:  12/15/15 60.1 kg (132 lb 7.9 oz)  12/06/15 61.7 kg (136 lb)  11/24/15 63.8 kg (140 lb 12 oz)    Physical Exam:  BP 116/65 (BP Location: Left Arm)   Pulse 72   Temp 97.6 F (36.4 C) (Oral)   Resp 18   Ht 5\' 3"  (1.6 m)   Wt 60.1 kg (132 lb 7.9 oz)   SpO2 94%   BMI 23.47 kg/m  Constitutional: He appears cachectic. NAD. HENT: Normocephalicand atraumatic.  Eyes: EOMI. No discharge.  Cardiovascular: Normal rateand regular rhythm. No JVD. Respiratory: +Gordon. SOB with minimal exertion. Mild crackles at bases. No stridor. No respiratory distress. He has no wheezes.  GI: Soft. Bowel sounds are normal. He exhibits no distension.  Musculoskeletal: He exhibits no edemaor tenderness.  Neurological: He is alertand oriented.  Motor: 4+/5 throughout (stable) Skin: Skin is warmand dry.  Not diaphoretic.  Assessment/Plan: 1. Functional deficits secondary to debility  from respiratory distress which require 3+ hours per day of interdisciplinary therapy in a comprehensive inpatient rehab setting. Physiatrist is providing close team supervision and 24 hour management of active medical problems listed below. Physiatrist and rehab team continue to assess barriers to discharge/monitor patient progress toward functional and medical goals.  Function:  Bathing Bathing position   Position: Shower  Bathing parts Body parts bathed by patient: Left arm, Right arm, Chest, Abdomen, Front perineal area, Buttocks, Right upper leg, Left upper leg Body parts bathed by helper: Right lower leg, Left lower leg, Back  Bathing assist Assist Level: Touching or steadying assistance(Pt > 75%)      Upper Body Dressing/Undressing Upper body dressing   What is the patient wearing?: Pull over shirt/dress     Pull over shirt/dress - Perfomed by patient: Thread/unthread right sleeve, Thread/unthread left sleeve, Put head through opening, Pull shirt over trunk          Upper body assist Assist Level: Set up      Lower Body Dressing/Undressing Lower body dressing   What is the patient wearing?: Pants, Shoes Underwear - Performed by patient: Thread/unthread right underwear leg, Thread/unthread left underwear leg, Pull underwear up/down   Pants- Performed by patient: Thread/unthread left pants leg, Thread/unthread right pants leg, Pull pants up/down           Shoes - Performed by patient: Don/doff right shoe, Don/doff left shoe            Lower body assist Assist for lower body dressing: Supervision or verbal cues      Lexicographer  steps completed by patient: Adjust clothing prior to toileting, Adjust clothing after toileting, Performs perineal hygiene   Toileting Assistive Devices: Other (comment) (urinal)  Toileting assist Assist level: Set up/obtain supplies, Supervision or verbal cues   Transfers Chair/bed transfer   Chair/bed transfer  method: Stand pivot Chair/bed transfer assist level: Supervision or verbal cues Chair/bed transfer assistive device: Patent attorney     Max distance: 150 ft Assist level: Supervision or verbal cues   Wheelchair       Assist Level: Dependent (Pt equals 0%)  Cognition Comprehension Comprehension assist level: Follows complex conversation/direction with extra time/assistive device  Expression Expression assist level: Expresses complex ideas: With extra time/assistive device  Social Interaction Social Interaction assist level: Interacts appropriately with others with medication or extra time (anti-anxiety, antidepressant).  Problem Solving Problem solving assist level: Solves complex problems: With extra time  Memory Memory assist level: Recognizes or recalls 90% of the time/requires cueing < 10% of the time     Medical Problem List and Plan: 1.  Weakness and poor activity tolerance secondary to debility from respiratory distress.  Cont CIR, plan for d/c tomorrow, will discuss d/c to LTAC based on symptoms and steroid wean  Will see pt for transitional care management in 1-2 weeks.  Pt needs to very gradually progress with activity due to severely limited tolerance due to SOB, CP, Desats, therapies. Pt continues to have difficulty tolerating despite 15/7. 2.  DVT Prophylaxis/Anticoagulation: Pharmaceutical: Other (comment)--Eliquis. H/o GIB in the past. Monitor for H/H.  3. Pain Management: Ultram prn. Local measures with heat/ice prn 4. Mood: Anxiety resolved. LCSW to follow for evaluation and support.  5. Neuropsych: This patient is capable of making decisions on his own behalf. 6. Skin/Wound Care: Maintain adequate nutritional and hydration status. Routine pressure relief measures 7. Fluids/Electrolytes/Nutrition: Monitor I/O.  8. Amiodarone toxicity with respiratory failure: Needs to maintain saturation 88-92%--on prednisone 60 mg daily since 10/30.              Taper by 10 mg every 2 weeks with outpatient f/u.   Recs for O2 supplementation per Pulm.  Appreciate recs  Nebs scheduled, will need to monitor HR  CTA 11/9 reviewed, emphysema with opacities, CXR 11/10 reviewed, stable, pulm following, await further recs  Possibly end stage fibrosis  Clinically slightly improved 9. VT/VF s/p AICD: On Mexitil and Ranexa  10. CAD with chronic systolic CHF with severe LVD: Cath 09/28/15 showed 2 vessel obstructive disease--unchanged from 2004. Unable to tolerate statins -- On Ranexa, Coreg and Cozaar. Lasix on hold.  Chest pain with increased activity.  Troponins stable, occurs with increased activity and drop in Sats, therapy schedule changed  Diuresing  11.  Leucocytosis:   Secondary to steroids, afebrile  WBCs 19.0 on 11/7  Cont to monitor  UA slightly+, Ucx NG, abx d/ced  CXR reviewed 10/31, ?PNA vs edema - cont to monitor, repeat reviewed 11/10 stable, CHF/Pulm edema.  Labs ordered 12  Anemia:   Hb 9.9 on 11/7  Cont to monitor  Labs ordered 13. Diarrhea: Resolved  Cont to monitor  But chronic loose stools after cholecystectomy, immodium started after BMs (home reg) 14. H/o AFib: In NSR. On eliquis --monitor off amiodarone. On Mexitil every 8 hours.  15. Hypoalbuminemia  Supplement started 11/2 16. AKI  Cr 1.27 on 11/10  After diuresis, Cont per Cards  Cont to monitor   LOS (Days) 9 A FACE TO FACE EVALUATION WAS PERFORMED  Ankit  Karis Juba 12/15/2015 8:55 AM

## 2015-12-15 NOTE — Progress Notes (Signed)
Social Work  Discharge Note  The overall goal for the admission was met for:   Discharge location: Yes-HOME WITH WIFE WHO CAN PROVIDE 24 HR SUPERVISION LEVEL  Length of Stay: Yes-10 DAYS  Discharge activity level: Yes-SUPERVISION LEVEL  Home/community participation: Yes  Services provided included: MD, RD, PT, OT, RN, CM, TR, Pharmacy and SW  Financial Services: Medicare and Private Insurance: [employer] BCBS  Follow-up services arranged: Home Health: [facility]-PT, OT,RN, DME: [facility] and Patient/Family request agency HH: [facility], DME: [place] [facility]  Comments (or additional information):PT REACHED SUPERVISION LEVEL-THIS PROGRAM IS TOO MUCH FOR HIM ENERGY AND ENDURANCE WISE. CAN MOVE AT A SLOWER PACE AT [place].  HE FEELS READY TO GO HOME AND WIFE CAN PROVIDE SUPERVISION LEVEL. THN TO FOLLOW AT HOME ALSO. MD BROUGHT UP LTACH UNIT PT REFUSED AND WANTS TO GO HOME. HAD TO GET NEW ORDER FOR [facility] FOR HIGH FLOW O2 METER SO CAN PROVIDE THE 8-10 LITER FLOW HE REQUIRES.  Patient/Family verbalized understanding of follow-up arrangements: Yes  Individual responsible for coordination of the follow-up plan: SELF & MARIE-WIFE  Confirmed correct DME delivered: Elease Hashimoto 12/15/2015    Elease Hashimoto

## 2015-12-15 NOTE — Progress Notes (Signed)
Physical Therapy Session Note  Patient Details  Name: RORAN RELF MRN: [id number] Date of Birth: February 18, 1945  Today's Date: 12/15/2015 PT Individual Time: 1100-1115 PT Individual Time Calculation (min): 15 min    Short Term Goals: Week 1:  PT Short Term Goal 1 (Week 1): Patient will ambulate 50 ft using LRAD with close supervision and min cues for safety.  PT Short Term Goal 2 (Week 1): Patient will perform transfers with supervision and min cues for safety.  PT Short Term Goal 3 (Week 1): Patient will negotiate up/down 13 stairs using 2 rails with min A.  PT Short Term Goal 4 (Week 1): Patient will propel wheelchair x 50 ft for strengthening and endurance.   Skilled Therapeutic Interventions/Progress Updates:    Patient in bed with wife at bedside, reporting fatigue and requesting to stay in bed. Focus on goals, discharge planning, DME needs, and home setup with wife showing pictures of steps to enter house from garage and chair placed next to door as discussed yesterday for patient to rest upon entering home. Patient and wife appreciative of education and left in bed with all needs in reach.   Therapy Documentation Precautions:  Precautions Precautions: Fall Precaution Comments: high flow O2 Restrictions Weight Bearing Restrictions: No General: PT Amount of Missed Time (min): 45 Minutes PT Missed Treatment Reason: Patient fatigue Pain: Pain Assessment Pain Assessment: No/denies pain Pain Score: 0-No pain  See Function Navigator for Current Functional Status.   Therapy/Group: Individual Therapy  Kerney Elbe 12/15/2015, 12:38 PM

## 2015-12-15 NOTE — Progress Notes (Signed)
Social Work Patient ID: [name], male   DOB: February 06, 1945, 70 y.o.   MRN: [id number] Pam-PA reports pt is not going home until Sun MD to monitor one more day. Pt made aware.

## 2015-12-15 NOTE — Progress Notes (Signed)
Patient switched from HFNC to standard [place] at 4L by Pulmonologys request.  At rest, patient able to maintain high 80's low 90's.  Upon working with PT, performed one transfer from bed to chair and desaturated to 81% on 4L [place].  Increased flow to 6L with only an increase to 86%.  Patient symptomatic stating he feels he is "gasping for air."  Increased oxygen to 8L via [place] and patient came up to 90%.  PT continuing to work with patient and monitoring oxygen levels closely.  Dani Gobble, RN

## 2015-12-15 NOTE — Progress Notes (Signed)
Patient denied CP or pressure or fluttering symptoms prior to passing out. He does report feeling sweaty and some nausea after being placed in trendelenburg position.  Orthostatic changes noted. He did receive lasix 40 mg X 3 in past 12 hours as well as dye from [facility]A--encouraged patient to push fluids to help compensate for dehydration likely from over diuresis.

## 2015-12-15 NOTE — Telephone Encounter (Signed)
Yes, proceed. Thanks.

## 2015-12-15 NOTE — Telephone Encounter (Signed)
Verbal orders given to [place], [place]

## 2015-12-15 NOTE — Progress Notes (Signed)
Occupational Therapy Session Note  Patient Details  Name: [name] MRN: [id number] Date of Birth: December 28, 1945  Today's Date: 12/15/2015  OT Individual Time: 0900-0950 OT Individual Time Calculation (min): 50 min   Short Term Goals: Week 1:  OT Short Term Goal 1 (Week 1): Pt will complete shower transfers with close S. OT Short Term Goal 2 (Week 1): Pt will be able to tolerate standing for 2 minutes with O2 sats >90. OT Short Term Goal 3 (Week 1): Pt will don pants with close S.  Skilled Therapeutic Interventions/Progress Updates:    1:1 OT session focused on modified bathing/dressing using energy conservation techniques. Pt reported feeling well this morning and eager to participate in therapy. Pt bumped from 8L to 10L in supine prior to activity. Pt transferred to EOB, took 2 minute rest break- SpO2 remained above 90. Pt then stood at EOB, SpO2 stable and no complaints of dizziness, then ambulated to the bathroom to transfer onto shower chair. Pt took seated rest break- desat to 86- recovered to 90. Stood to remove underwear, returned to sitting. SpO2 stable. Pt reported feeling light headed seated in shower chair- pt began looking pale in color and became minimally responsive to verbal commands.  OT elevated pt's legs, held pt in reclined position and pushed duress button. Pt improved slightly and responded to a few questions after elevating LE's as RN's entered room. Pt became minimally responsive again and was dependently transferred to w/c, then back to bed and placed in trendelenburg position -r pt recovered. BP 93/57. Pt left in care of RN .  Therapy Documentation Precautions:  Precautions Precautions: Fall Precaution Comments: high flow O2 Restrictions Weight Bearing Restrictions: No Pain: Pain Assessment Pain Assessment: No/denies pain Pain Score: 0-No pain ADL: ADL ADL Comments: refer to functional navigator  See Function Navigator for Current Functional  Status.   Therapy/Group: Individual Therapy  Mal Amabile 12/15/2015, 12:52 PM

## 2015-12-15 NOTE — Consult Note (Signed)
   [facility] CM Inpatient Consult   12/15/2015  KARTHIKEYA CORTEZ 03-31-1945 [id number]    [facility] Care Management follow up.  Spoke with inpatient rehab CSW, Kriste Basque, to make aware that [facility] Care Management will follow up post inpatient rehab discharge. Will await to re-refer back to [facility] once Mr. Rachal has discharged from hospital. Will follow up on Monday to confirm discharge date. Please see chart review then notes for patient outreach details from [facility] RNCM.  Spoke with Mr. Limbach at bedside who is agreeable to ongoing [facility] Care Management follow up. Made aware that [facility] Care Management will not interfere or replace services provided by home health. Appreciative of visit. Contact information provided. Will continue to follow.   Raiford Noble, MSN-Ed, RN,BSN [facility] Liaison [phone]

## 2015-12-15 NOTE — Progress Notes (Signed)
Physical Therapy Discharge Summary  Patient Details  Name: [name] MRN: [id number] Date of Birth: [birth year]  Today's Date: 12/15/2015 PT Individual Time: 1345-1415 PT Individual Time Calculation (min): 30 min   Patient has met 8 of 8 long term goals due to improved activity tolerance, improved balance, increased strength, ability to compensate for deficits, and decreased pain.  Patient to discharge at a household ambulatory level Supervision.   Patient's care partner is independent to provide the necessary physical assistance at discharge.  Reasons goals not met: NA  Recommendation:  Patient will benefit from ongoing skilled PT services in home health setting to continue to advance safe functional mobility, address ongoing impairments in strength, activity tolerance, standing balance, and minimize fall risk.  Equipment: transport chair, RW  Reasons for discharge: treatment goals met and discharge from hospital  Patient/family agrees with progress made and goals achieved: Yes  Skilled Therapeutic Intervention Patient in bed with wife present to complete family training, agreeable to therapy. Patient on 4L 02 via [place] at rest desaturating into low 80's with initial transfer to wheelchair, increased to 8 L 02 via [place] for remainder of session desaturating to 77-83% with activity requiring prolonged seated rests to increase > 90%, RN Whitney and PA Pam Love aware. Patient completed transfers, simulated car transfer to sedan height, and ambulation using RW with supervision and up/down 4 stairs without UE support to simulate home environment with min A from wife and ambulated up/down ramp and across uneven mulch surface using RW with supervision-steady assist due to instability. Patient declined further activity and left sitting in wheelchair with wife present for next OT session.   PT Discharge Precautions/RestrictionsPrecautions Precautions: Fall Precaution Comments: Monitor 02 and  BP Restrictions Weight Bearing Restrictions: No Vital Signs Therapy Vitals Temp: 97.7 F (36.5 C) Temp Source: Oral Pulse Rate: 87 Resp: 18 BP: 97/68 Patient Position (if appropriate): Sitting Oxygen Therapy SpO2: 97 % O2 Device: Nasal Cannula O2 Flow Rate (L/min): 4 L/min Pain Pain Assessment Pain Assessment: No/denies pain Pain Score: 0-No pain Vision/Perception   No change from baseline  Cognition Overall Cognitive Status: Within Functional Limits for tasks assessed Arousal/Alertness: Awake/alert Orientation Level: Oriented X4 Memory: Appears intact Safety/Judgment: Appears intact Sensation Sensation Light Touch: Appears Intact Stereognosis: Appears Intact Hot/Cold: Appears Intact Proprioception: Appears Intact Coordination Gross Motor Movements are Fluid and Coordinated: No Fine Motor Movements are Fluid and Coordinated: Yes Coordination and Movement Description: generalized weakness and deconditioning Motor  Motor Motor: Within Functional Limits  Mobility Bed Mobility Bed Mobility: Supine to Sit;Sit to Supine Supine to Sit: 6: Modified independent (Device/Increase time) Sit to Supine: 6: Modified independent (Device/Increase time) Transfers Transfers: Yes Stand Pivot Transfers: 5: Supervision;With armrests Locomotion  Ambulation Ambulation: Yes Ambulation/Gait Assistance: 5: Supervision Ambulation Distance (Feet): 50 Feet Assistive device: Rolling walker Gait Gait: Yes Gait velocity: improved safety with pacing from eval Stairs / Additional Locomotion Stairs: Yes Stairs Assistance: 4: Min assist Stair Management Technique: No rails;Step to pattern;Forwards Number of Stairs: 4 Height of Stairs: 6 Wheelchair Mobility Wheelchair Mobility: No  Trunk/Postural Assessment  Cervical Assessment Cervical Assessment: Within Functional Limits Thoracic Assessment Thoracic Assessment: Exceptions to [facility] (kyphotic in static sitting, with cues can hold  upright posture) Lumbar Assessment Lumbar Assessment: Exceptions to [facility] (posterior pelvic tilt) Postural Control Postural Control: Within Functional Limits  Balance Dynamic Standing Balance Dynamic Standing - Level of Assistance: 5: Stand by assistance Extremity Assessment  RUE Assessment RUE Assessment: Within Functional Limits LUE Assessment LUE Assessment: Within  Functional Limits RLE Assessment RLE Assessment: Within Functional Limits LLE Assessment LLE Assessment: Within Functional Limits   See Function Navigator for Current Functional Status.  Carney Living A 12/15/2015, 1:15 PM

## 2015-12-16 LAB — BASIC METABOLIC PANEL
ANION GAP: 9 (ref 5–15)
BUN: 36 mg/dL — ABNORMAL HIGH (ref 6–20)
CO2: 28 mmol/L (ref 22–32)
Calcium: 8.4 mg/dL — ABNORMAL LOW (ref 8.9–10.3)
Chloride: 100 mmol/L — ABNORMAL LOW (ref 101–111)
Creatinine, Ser: 1.08 mg/dL (ref 0.61–1.24)
GFR calc Af Amer: >60 mL/min (ref >60)
GLUCOSE: 102 mg/dL — AB (ref 65–99)
POTASSIUM: 4.3 mmol/L (ref 3.5–5.1)
Sodium: 137 mmol/L (ref 135–145)

## 2015-12-16 NOTE — Progress Notes (Signed)
PHYSICAL MEDICINE & REHABILITATION     PROGRESS NOTE  Subjective/Complaints:  Cruciate pulmonary consult. They are now signed off  ROS: +DOE. Denies CP, N/V/D.  Objective: Vital Signs: Blood pressure 126/71, pulse 91, temperature 97.5 F (36.4 C), temperature source Oral, resp. rate 17, height 5\' 3"  (1.6 m), weight 55.7 kg (122 lb 12.7 oz), SpO2 90 %. Ct Angio Chest Pe W Or Wo Contrast  Result Date: 12/14/2015 CLINICAL DATA:  Dyspnea with elevated D-dimer. No history of cancer or chest pain. EXAM: CT ANGIOGRAPHY CHEST WITH CONTRAST TECHNIQUE: Multidetector CT imaging of the chest was performed using the standard protocol during bolus administration of intravenous contrast. Multiplanar CT image reconstructions and MIPs were obtained to evaluate the vascular anatomy. CONTRAST:  47 cc of Isovue 370 IV COMPARISON:  11/17/2015 FINDINGS: Cardiovascular: Satisfactory opacification of the pulmonary arteries to the segmental level. No evidence of pulmonary embolism. Stable cardiomegaly without pericardial effusion. LAD and circumflex coronary arteriosclerosis. Aortic atherosclerosis without aneurysm. Mediastinum/Nodes: The tracheobronchial tree and esophagus are unremarkable. Right paratracheal lymph node measuring 15 mm short axis is currently noted versus 12 mm previously, likely reactive. A few calcified nodes are seen in the right hilum consistent with old granulomatous disease. Stable sub cm appearing thyroid nodules as before. Lungs/Pleura: There is bilateral centrilobular emphysema with subpleural blebs. Mild bronchiectasis to the right middle lobe with superimposed areas of nonspecific ground-glass opacity are noted bilaterally within all lobes. These may represent areas secondary to CHF, hypoventilatory change or potentially areas of infection or inflammation among some considerations though not exclusive. Patchy airspace opacity seen previously posteriorly in the left lower lobe is less  prominent with more focal consolidation seen in the right lower lobe posteriorly on current exam. The previously noted irregular nodule in the right upper lobe seen on prior comparison is obscured by new ground-glass opacities and emphysematous change. Upper Abdomen: Normal appearing visualized adrenal glands bilaterally. Cholecystectomy. No space-occupying mass of the liver. The visualized pancreas and spleen are unremarkable. Musculoskeletal: No acute osseous abnormality. Mild degenerative endplate changes along the upper thoracic spine. Review of the MIP images confirms the above findings. IMPRESSION: Insert 1. No acute pulmonary embolus. 2. Bilateral centrilobular emphysema with subpleural blebs, right middle lobe mild bronchiectasis and nonspecific bilateral ground-glass opacities of the lungs, possibly sequela of CHF, pneumonitis/alveolitis and/or hypoventilatory change among some considerations though not exclusive. 3. 4 mm right upper lobe nodular density is obscured by a new areas of ground-glass opacity and centrilobular emphysema. Electronically Signed   By: Tollie Eth M.D.   On: 12/14/2015 18:03   Dg Chest Port 1 View  Result Date: 12/15/2015 CLINICAL DATA:  Acute respiratory failure with hypoxia. History of COPD, atrial fibrillation, recently discontinued smoking. EXAM: PORTABLE CHEST 1 VIEW COMPARISON:  PA and lateral chest x-ray of December 13, 2015 and May 25, 2015 FINDINGS: The lungs are well-expanded. The interstitial markings remain increased diffusely. There is no significant pleural effusion. The cardiac silhouette remains enlarged. The pulmonary vascularity is not clearly engorged. The ICD is in stable position. The bony thorax exhibits no acute abnormality. There is chronic widening of the right AC joint. IMPRESSION: Persistent diffusely increased interstitial markings consistent with interstitial edema. There has not been significant interval change since yesterday's study. Stable  cardiomegaly. Electronically Signed   By: David  [place] M.D.   On: 12/15/2015 07:30    Recent Labs  12/15/15 0958  WBC 17.3*  HGB 11.3*  HCT 36.6*  PLT 358  Recent Labs  12/15/15 0702 12/16/15 0432  NA 139 137  K 3.9 4.3  CL 98* 100*  GLUCOSE 139* 102*  BUN 33* 36*  CREATININE 1.27* 1.08  CALCIUM 7.9* 8.4*   CBG (last 3)  No results for input(s): GLUCAP in the last 72 hours.  Wt Readings from Last 3 Encounters:  12/16/15 55.7 kg (122 lb 12.7 oz)  12/06/15 61.7 kg (136 lb)  11/24/15 63.8 kg (140 lb 12 oz)    Physical Exam:  BP 126/71 (BP Location: Right Arm)   Pulse 91   Temp 97.5 F (36.4 C) (Oral)   Resp 17   Ht 5\' 3"  (1.6 m)   Wt 55.7 kg (122 lb 12.7 oz)   SpO2 90%   BMI 21.75 kg/m  Constitutional: He appears cachectic. NAD. HENT: Normocephalicand atraumatic.  Eyes: EOMI. No discharge.  Cardiovascular: Normal rateand regular rhythm. No JVD. Respiratory: +Snyder. SOB with minimal exertion. Mild crackles at bases. No stridor. No respiratory distress. He has no wheezes.  GI: Soft. Bowel sounds are normal. He exhibits no distension.  Musculoskeletal: He exhibits no edemaor tenderness.  Neurological: He is alertand oriented.  Motor: 4+/5 throughout (stable) Skin: Skin is warmand dry.  Not diaphoretic.  Assessment/Plan: 1. Functional deficits secondary to debility from respiratory distress which require 3+ hours per day of interdisciplinary therapy in a comprehensive inpatient rehab setting. Physiatrist is providing close team supervision and 24 hour management of active medical problems listed below. Physiatrist and rehab team continue to assess barriers to discharge/monitor patient progress toward functional and medical goals.  Function:  Bathing Bathing position   Position: Wheelchair/chair at sink  Bathing parts Body parts bathed by patient: Left arm, Right arm, Chest, Abdomen, Front perineal area, Buttocks, Right upper leg, Left upper leg, Right  lower leg, Left lower leg Body parts bathed by helper: Back  Bathing assist Assist Level: Supervision or verbal cues      Upper Body Dressing/Undressing Upper body dressing   What is the patient wearing?: Pull over shirt/dress     Pull over shirt/dress - Perfomed by patient: Thread/unthread right sleeve, Thread/unthread left sleeve, Put head through opening, Pull shirt over trunk          Upper body assist Assist Level: Set up   Set up : To obtain clothing/put away  Lower Body Dressing/Undressing Lower body dressing   What is the patient wearing?: Pants, Underwear, Shoes Underwear - Performed by patient: Thread/unthread right underwear leg, Thread/unthread left underwear leg, Pull underwear up/down   Pants- Performed by patient: Thread/unthread right pants leg, Thread/unthread left pants leg, Pull pants up/down, Fasten/unfasten pants           Shoes - Performed by patient: Don/doff right shoe, Don/doff left shoe            Lower body assist Assist for lower body dressing: Supervision or verbal cues      Toileting Toileting   Toileting steps completed by patient: Adjust clothing prior to toileting, Performs perineal hygiene, Adjust clothing after toileting   Toileting Assistive Devices: Grab bar or rail  Toileting assist Assist level: Set up/obtain supplies   Transfers Chair/bed transfer   Chair/bed transfer method: Stand pivot Chair/bed transfer assist level: Supervision or verbal cues Chair/bed transfer assistive device: Patent attorneyWalker     Locomotion Ambulation     Max distance: 25 Assist level: Supervision or verbal cues   Wheelchair       Assist Level: Dependent (Pt equals 0%)  Cognition Comprehension  Comprehension assist level: Follows complex conversation/direction with no assist  Expression Expression assist level: Expresses complex ideas: With extra time/assistive device  Social Interaction Social Interaction assist level: Interacts appropriately with  others with medication or extra time (anti-anxiety, antidepressant).  Problem Solving Problem solving assist level: Solves complex problems: With extra time  Memory Memory assist level: Recognizes or recalls 75 - 89% of the time/requires cueing 10 - 24% of the time     Medical Problem List and Plan: 1.  Weakness and poor activity tolerance secondary to debility from respiratory distress.    . Anticipate discharge tomorrow. 2.  DVT Prophylaxis/Anticoagulation: Pharmaceutical: Other (comment)--Eliquis. H/o GIB in the past. Monitor for H/H.  3. Pain Management: Ultram prn. Local measures with heat/ice prn 4. Mood: Anxiety resolved. LCSW to follow for evaluation and support.  5. Neuropsych: This patient is capable of making decisions on his own behalf. 6. Skin/Wound Care: Maintain adequate nutritional and hydration status. Routine pressure relief measures 7. Fluids/Electrolytes/Nutrition: Monitor I/O.  8. Amiodarone toxicity with respiratory failure: Needs to maintain saturation 88-92%--on prednisone 60 mg daily since 10/30.             Taper by 10 mg every 2 weeks with outpatient f/u.   Recs for O2 supplementation per Pulm.  Appreciate recs  Nebs scheduled, will need to monitor HR  CTA 11/9 reviewed, emphysema with opacities, CXR 11/10 reviewed, stable, pulm following, await further recs  Possibly end stage fibrosis  Clinically slightly improved 9. VT/VF s/p AICD: On Mexitil and Ranexa  10. CAD with chronic systolic CHF with severe LVD: Cath 09/28/15 showed 2 vessel obstructive disease--unchanged from 2004. Unable to tolerate statins -- On Ranexa, Coreg and Cozaar. Lasix on hold.  Chest pain with increased activity.  Troponins stable, occurs with increased activity and drop in Sats, therapy schedule changed  Diuresing  11.  Leucocytosis:   Secondary to steroids, afebrile  WBCs 19.0 on 11/7, 17 on 12/15/2015    UA slightly+, Ucx NG, abx d/ced  CXR reviewed 10/31, ?PNA vs edema - cont to  monitor, repeat reviewed 11/10 stable, CHF/Pulm edema.  Labs ordered 12  Anemia:   Hb 9.9 on 11/7, improved to 11. 3 on 12/15/2015  13. Diarrhea: Resolved  Cont to monitor  But chronic loose stools after cholecystectomy, immodium started after BMs (home reg) 14. H/o AFib: In NSR. On eliquis --monitor off amiodarone. On Mexitil every 8 hours.  15. Hypoalbuminemia  Supplement started 11/2 16. AKI  Cr 1.27 on 11/10  After diuresis, Cont per Cards  Cont to monitor   LOS (Days) 10 A FACE TO FACE EVALUATION WAS PERFORMED  Claudette Laws E 12/16/2015 11:07 AM

## 2015-12-17 NOTE — Progress Notes (Signed)
[facility]IL[facility]     PROGRESS NOTE  Subjective/Complaints:  Feels great this morning, wife is at bedside Asking about oxygen tank for transport to home. Does have oxygen at home. Wife did not bring the tank with her   ROS: +DOE. Denies CP, N/V/D.  Objective: Vital Signs: Blood pressure 114/64, pulse 68, temperature 97.5 F (36.4 C), temperature source Oral, resp. rate 18, height 5\' 3"  (1.6 m), weight 57.6 kg (127 lb), SpO2 94 %. No results found.  Recent Labs  12/15/15 0958  WBC 17.3*  HGB 11.3*  HCT 36.6*  PLT 358    Recent Labs  12/15/15 0702 12/16/15 0432  NA 139 137  K 3.9 4.3  CL 98* 100*  GLUCOSE 139* 102*  BUN 33* 36*  CREATININE 1.27* 1.08  CALCIUM 7.9* 8.4*   CBG (last 3)  No results for input(s): GLUCAP in the last 72 hours.  Wt Readings from Last 3 Encounters:  12/17/15 57.6 kg (127 lb)  12/06/15 61.7 kg (136 lb)  11/24/15 63.8 kg (140 lb 12 oz)    Physical Exam:  BP 114/64   Pulse 68   Temp 97.5 F (36.4 C) (Oral)   Resp 18   Ht 5\' 3"  (1.6 m)   Wt 57.6 kg (127 lb)   SpO2 94%   BMI 22.50 kg/m  Constitutional: He appears cachectic. NAD. HENT: Normocephalicand atraumatic.  Eyes: EOMI. No discharge.  Cardiovascular: Normal rateand regular rhythm. No JVD. Respiratory: +[place]. SOB with minimal exertion. Mild crackles at bases. No stridor. No respiratory distress. He has no wheezes.  GI: Soft. Bowel sounds are normal. He exhibits no distension.  Musculoskeletal: He exhibits no edemaor tenderness.  Neurological: He is alertand oriented.  Motor: 4+/5 throughout (stable) Skin: Skin is warmand dry.  Not diaphoretic.  Assessment/Plan: 1. Functional deficits secondary to debility from respiratory distress Stable for D/C today F/u PCP in 3-4 weeks F/u PM&R 2 weeks See D/C summary See D/C instructions Function:  Bathing Bathing position   Position: Wheelchair/chair at sink  Bathing parts Body parts bathed by  patient: Left arm, Right arm, Chest, Abdomen, Front perineal area, Buttocks, Right upper leg, Left upper leg, Right lower leg, Left lower leg Body parts bathed by helper: Back  Bathing assist Assist Level: Supervision or verbal cues      Upper Body Dressing/Undressing Upper body dressing   What is the patient wearing?: Pull over shirt/dress     Pull over shirt/dress - Perfomed by patient: Thread/unthread right sleeve, Thread/unthread left sleeve, Put head through opening, Pull shirt over trunk          Upper body assist Assist Level: Set up   Set up : To obtain clothing/put away  Lower Body Dressing/Undressing Lower body dressing   What is the patient wearing?: Pants, Underwear, Shoes Underwear - Performed by patient: Thread/unthread right underwear leg, Thread/unthread left underwear leg, Pull underwear up/down   Pants- Performed by patient: Thread/unthread right pants leg, Thread/unthread left pants leg, Pull pants up/down, Fasten/unfasten pants           Shoes - Performed by patient: Don/doff right shoe, Don/doff left shoe            Lower body assist Assist for lower body dressing: Supervision or verbal cues      Toileting Toileting   Toileting steps completed by patient: Adjust clothing prior to toileting, Performs perineal hygiene, Adjust clothing after toileting   Toileting Assistive Devices: Grab bar or rail  Toileting assist Assist level: Supervision or verbal cues   Transfers Chair/bed transfer   Chair/bed transfer method: Stand pivot Chair/bed transfer assist level: Supervision or verbal cues Chair/bed transfer assistive device: Patent attorney     Max distance: 25 Assist level: Supervision or verbal cues   Wheelchair       Assist Level: Dependent (Pt equals 0%)  Cognition Comprehension Comprehension assist level: Follows complex conversation/direction with no assist  Expression Expression assist level: Expresses complex  ideas: With no assist  Social Interaction Social Interaction assist level: Interacts appropriately with others - No medications needed.  Problem Solving Problem solving assist level: Solves complex problems: Recognizes & self-corrects  Memory Memory assist level: Complete Independence: No helper     Medical Problem List and Plan: 1.  Weakness and poor activity tolerance secondary to debility from respiratory distress.    .Stable for discharge today, needs oxygen for transport 2.  DVT Prophylaxis/Anticoagulation: Pharmaceutical: Other (comment)--Eliquis. H/o GIB in the past. Monitor for H/H.  3. Pain Management: Ultram prn. Local measures with heat/ice prn 4. Mood: Anxiety resolved. LCSW to follow for evaluation and support.  5. Neuropsych: This patient is capable of making decisions on his own behalf. 6. Skin/Wound Care: Maintain adequate nutritional and hydration status. Routine pressure relief measures 7. Fluids/Electrolytes/Nutrition: Monitor I/O.  8. Amiodarone toxicity with respiratory failure: Needs to maintain saturation 88-92%--on prednisone 60 mg daily since 10/30.             Taper by 10 mg every 2 weeks with outpatient f/u.   Recs for O2 supplementation per Pulm.  Appreciate recs  Nebs scheduled, will need to monitor HR  CTA 11/9 reviewed, emphysema with opacities, CXR 11/10 reviewed, stable, pulm following, await further recs  Possibly end stage fibrosis  Clinically slightly improved 9. VT/VF s/p AICD: On Mexitil and Ranexa  10. CAD with chronic systolic CHF with severe LVD: Cath 09/28/15 showed 2 vessel obstructive disease--unchanged from 2004. Unable to tolerate statins -- On Ranexa, Coreg and Cozaar. Lasix on hold.  Chest pain with increased activity.  Troponins stable, occurs with increased activity and drop in Sats, therapy schedule changed   11.  Leucocytosis:   Secondary to steroids, afebrile  WBCs 19.0 on 11/7, 17 on 12/15/2015    12  Anemia:   Hb 9.9 on 11/7,  improved to 11. 3 on 12/15/2015  13. Diarrhea: Resolved  Cont to monitor  But chronic loose stools after cholecystectomy, immodium started after BMs (home reg) 14. H/o AFib: In NSR. On eliquis --monitor off amiodarone. On Mexitil every 8 hours.  15. Hypoalbuminemia  Supplement started 11/2 16. AKI  Cr 1.27 on 11/10  After diuresis, Cont per Cards  Cont to monitor   LOS (Days) 11 A FACE TO FACE EVALUATION WAS PERFORMED  KIRSTEINS,ANDREW E 12/17/2015 11:08 AM

## 2015-12-17 NOTE — Progress Notes (Signed)
Patient and wife given discharge instructions and prescription from Marissa Nestle, PA on the 10th; all questions answered.  Patient received his home oxgen tanks.  Patient escorted via wheelchair to wife's vehicle by [place], [place].

## 2015-12-18 ENCOUNTER — Telehealth: Payer: Self-pay | Admitting: *Deleted

## 2015-12-18 ENCOUNTER — Telehealth: Payer: Self-pay

## 2015-12-18 ENCOUNTER — Other Ambulatory Visit: Payer: Self-pay | Admitting: *Deleted

## 2015-12-18 ENCOUNTER — Other Ambulatory Visit: Payer: Self-pay | Admitting: Nurse Practitioner

## 2015-12-18 ENCOUNTER — Encounter: Payer: Self-pay | Admitting: *Deleted

## 2015-12-18 DIAGNOSIS — J441 Chronic obstructive pulmonary disease with (acute) exacerbation: Secondary | ICD-10-CM

## 2015-12-18 NOTE — Patient Outreach (Signed)
[facility] Customer service manager [facility]) Care Management [facility] Community CM Telephone Outreach, Transition of Care day 1 12/18/2015  [name] 30-Mar-1945 [id number]  Successful telephone outreach to [name], wife (on [facility] CM written consent) of patient [name], 70 y/o male referred to [facility] Community CM for transition of care after multiple recent hospitalizations:  October 2-4, 2017; October 13-17, 2017 for HCAP, hypoxia, Acute Respiratory Failure, and AKI secondary to dehydration. Patient noted to have history including but not limited to, COPD, sCHF, CAD with CABG, V-fib with AICD placement, A-Fib, bradycardia, and GERD.  Unfortunately, patient was readmitted November 28, 2015- December 06, 2015 for HCAP, Acute on Chronic Respiratory failure, hypoxia and syncope.  Patient was transferred to IP Rehab Unit on December 06, 2015 and was discharged home with home health [facility]) services on December 17, 2015.  HIPAA/ identity verified with patient's wife today and verbal consent to [facility] Community CM involvement in patient's care was provided.  Today, [name] reports that patient "is doing pretty good," but states that they "both feel overwhelmed after everything" patient has been through.  [name] states that she is driving now to obtain patient's new prescriptions, and that she "can't talk for very long."  -- Multiple provider appointments scheduled for this coming week:  Pulmonology, PCP, Rehab providers; [name] that patient will attend appointments, and that she will transport patient to all.  -- Home Health [facility]) services:  [name] states that she has heard back from [facility] agency, who she does not remember name of; states that they had wanted to schedule initial visit, but she was unable to schedule due to so many upcoming provider appointments.  [name] voiced plans to contact [facility] agency, stating that she has their number at her home.  -- Medications:  Patient and his  wife report that patient has all medications and is taking as prescribed;  [name] states that she is currently driving to pharmacy now to fill remaining prescriptions; she states that she is unable to complete medication reconciliation today.    [facility] Community CM services were again discussed with patient's wife, including how HH services differs from [facility] Community CM.  [name] verbalized understanding and asked that I call her back "in a day or two when things calm down," on her cell number [phone]).  I confirmed that [name] had my direct phone number, the main office number for [facility] CM, and the 24-hour nurse advice line.  Mrs. Salsman denies further questions, concerns, problems or issues today.  Plan:  [name] will take his medications as they are prescribed and will attend all scheduled provider appointments.  [name] will promptly notify his providers for any concerns, issues, or problems that arise.  [name] will follow up with [facility] agency regarding Hh services ordered post-hospital discharge.  [facility] Community CM outreach for transition of care to continue with scheduled phone call later this week.  Caryl Pina, RN, BSN, [facility] Care Management  [phone]

## 2015-12-18 NOTE — Telephone Encounter (Signed)
Transition Care Management Follow-up Telephone Call    Date discharged? 12/17/15  How have you been since you were released from the hospital? Recovering. Pt is using O2 @ 4L [place] continuous.   Any patient concerns? Pt inquired about obtaining a portable oxygen tank. Pt was encouraged to share request with pulmonologist at future appt on 12/19/15.    Items Reviewed:  Medications reviewed: Yes  Allergies reviewed: Yes  Dietary changes reviewed: Yes  Referrals reviewed: Yes   Functional Questionnaire:  Independent - I Dependent - D    Activities of Daily Living (ADLs):  Pt is independent with all ADLs and iADLs. Pt may have assistance as needed from spouse. Pt uses a walker and/or wheelchair as needed due to generalized weakness. Pt is able to unable to drive at this time but denies any concerns with transportation.    Confirmed importance and date/time of follow-up visits scheduled YES  Provider Appointment booked with PCP 12/25/15 @ 1215  Confirmed with patient if condition begins to worsen call PCP or go to the ER.  Patient was given the office number and encouraged to call back with question or concerns: YES

## 2015-12-18 NOTE — Telephone Encounter (Signed)
Contact made with wife  1. Are you/is patient experiencing any problems since coming home? Are there any questions regarding any aspect of care? Have had some difficulty getting some of his meds.  They were first sent to [facility] in [place] and they Use [facility]. Corrected now but the Mexitil needed to be ordered. 2. Are there any questions regarding medications administration/dosing? Are meds being taken as prescribed? Patient should review meds with caller to confirm See above answer 3. Have there been any falls? No 4. Has Home Health been to the house and/or have they contacted you? If not, have you tried to contact them? Can we help you contact them? They called but initial appt has not been set up.  There is a bit of confusion about who it is with.  SW note says [facility] but the card Mrs Erich Montane has says [facility].  I will check with Lurena Joiner Dupree 5. Are bowels and bladder emptying properly? Are there any unexpected incontinence issues? If applicable, is patient following bowel/bladder programs? No 6. Any fevers, problems with breathing, unexpected pain? No 7. Are there any skin problems or new areas of breakdown? No 8. Has the patient/family member arranged specialty MD follow up (ie cardiology/neurology/renal/surgical/etc)?  Can we help arrange? Appt given for Dr Allena Katz which is this week 12/20/15 .  He sees the pulmonologist tomorrow 9. Does the patient need any other services or support that we can help arrange? I am checking on the HH 10. Are caregivers following through as expected in assisting the patient? Yes 11. Has the patient quit smoking, drinking alcohol, or using drugs as recommended? N/A  Appointment time 11:40 arrive by 11:20 for appointment with Dr Allena Katz Address reviewed [address]     Appt 12/20/15 with Dr Allena Katz arrive 11:20 for an 11:40 appt time.

## 2015-12-19 ENCOUNTER — Encounter: Payer: Self-pay | Admitting: Adult Health

## 2015-12-19 ENCOUNTER — Ambulatory Visit (INDEPENDENT_AMBULATORY_CARE_PROVIDER_SITE_OTHER): Payer: Medicare Other | Admitting: Adult Health

## 2015-12-19 ENCOUNTER — Ambulatory Visit (INDEPENDENT_AMBULATORY_CARE_PROVIDER_SITE_OTHER)
Admission: RE | Admit: 2015-12-19 | Discharge: 2015-12-19 | Disposition: A | Discharge status: Home / Self Care | Payer: Medicare Other | Source: Ambulatory Visit | Attending: Adult Health | Admitting: Adult Health

## 2015-12-19 VITALS — BP 132/70 | HR 66 | Temp 97.3°F | Ht 63.0 in | Wt 123.0 lb

## 2015-12-19 DIAGNOSIS — J9601 Acute respiratory failure with hypoxia: Secondary | ICD-10-CM

## 2015-12-19 DIAGNOSIS — J984 Other disorders of lung: Secondary | ICD-10-CM | POA: Diagnosis not present

## 2015-12-19 DIAGNOSIS — T462X5A Adverse effect of other antidysrhythmic drugs, initial encounter: Secondary | ICD-10-CM | POA: Diagnosis not present

## 2015-12-19 DIAGNOSIS — Z9981 Dependence on supplemental oxygen: Secondary | ICD-10-CM

## 2015-12-19 DIAGNOSIS — I255 Ischemic cardiomyopathy: Secondary | ICD-10-CM | POA: Diagnosis not present

## 2015-12-19 DIAGNOSIS — R0602 Shortness of breath: Secondary | ICD-10-CM | POA: Diagnosis not present

## 2015-12-19 DIAGNOSIS — R05 Cough: Secondary | ICD-10-CM | POA: Diagnosis not present

## 2015-12-19 MED ORDER — PREDNISONE 20 MG PO TABS: 60.0000 mg | ORAL_TABLET | Freq: Every day | ORAL | 0 refills | Status: DC

## 2015-12-19 NOTE — Patient Instructions (Addendum)
Continue on Prednisone 60mg  daily for 2 weeks then decrease to 50mg  daily for 2 weeks then 40mg  daily -hold at this dose until seen back in office  Chest xray today .  Follow up with Primary MD as planned for labs.  Continue on oxygen 4l/m . Goal is to keep Oxygen level >90% .  Follow up with Dr. Isaiah Serge in 4 weeks and As needed   Please contact office for sooner follow up if symptoms do not improve or worsen or seek emergency care    Addendum 12/20/2015 >pt will decrease to prednisone 50mg  daily for 2 weeks then 40mg  daily and hold at this dose beginning on 12/22/15.  Pt and wife are aware .

## 2015-12-19 NOTE — Progress Notes (Signed)
Subjective:    Patient ID: [name], male    DOB: [birth year], 70 y.o.   MRN: [id number]  HPI 70 year old male with systolic ischemic cardio myopathy with an EF of 25% seen for pulmonary consult for recurrent pneumonia 11/28/15 felt to have amiodarone pulmonary toxicity.  Extensive card hx with A Fib , AICD   TEST  8/24 LHC > Prox RCA lesion 100%, Mid cx 70%. EF 25% estimated. Continue medical therapy, no PCI. CT chest 10/13 > Multi focal infiltrate involving the lower lobes, left greater than right, and the left upper lobe is most likely an infectious or inflammatory process. Recommend short-term follow-up to ensure resolution. 4 mm nodule in the right upper lobe. Recommend attention on short-term follow-up. Echo 10/26 >GR 2 DD , severe MR, Mod TR w/ moderately elevated pulmonary pressure, severely reduced LV systolic fxn.  CT chest 12/14/15 >neg PE , bilateral emphysema, subpleural blebs, RML mild bronchiecatasis , GGO , 4 mm RUL nodular density and GGO .   12/19/2015 Post Hospital follow up -extended /transition visit.  Pt returns for post hospital follow up . Pt has had a very complicated hospital course over last 3 months with 5 admission , most recent ones for recurrent PNA. Pt was seen by pulmonary and felt to have probable amiodarone lung toxcitity . RA factor and anti CCP were weakly positive (to be repeated once down/off steroids) . [name] He was treated with aggressive high dose steroids , currently on prednisone 60mg  daily . Hospital stay complicated with hypotenison , arrythymia with ACID firing x 2 .  He did require discharge to rehab and is now home last couple of days. He required diuresis for fluid overload. He initially required high flow O2 ~10-12 l/m but has been able to decrease 4l/m .  He is feeling better, starting to get stronger . Very glad to be home .  He denies chest pain, orthopnea, edema or fever.       Past Medical History:  Diagnosis Date  . Acute lower GI  bleeding 12/11/2011   "first time" (12/11/2011)  . Acute myocardial infarction, unspecified site, episode of care unspecified 1995   Pt living in [place], no stent, ?PTCA  . AICD (automatic cardioverter/defibrillator) present   . Allergic rhinitis, cause unspecified   . Anxiety   . Arthritis    "all over" (11/07/2015)  . Atrial fibrillation (HCC)    a. 05/2015 - converted to sinus in setting of ICD shocks; placed on eliquis 5 bid.  [name] CAD (coronary artery disease), autologous vein bypass graft   . Cervical herniated disc    told not to lift >10 lbs  . Chronic systolic CHF (congestive heart failure), NYHA class 2 (HCC)    Reports EF of 25%.   [name] COPD (chronic obstructive pulmonary disease) (HCC) 11/2012   by xray  . Diverticulosis    by CT scan  . HCAP (healthcare-associated pneumonia) 11/06/2015  . Hypertension   . Insomnia   . Paroxysmal ventricular tachycardia (HCC)   . Perennial allergic rhinitis    only to dust mites  . Pneumonia 2000s   "walking pneumonia"  . VT (ventricular tachycardia) (HCC)    a. 05/2015 - VT storm with multiple ICD shocks-->Amio 400 BID.   Current Outpatient Prescriptions on File Prior to Visit  Medication Sig Dispense Refill  . acetaminophen (TYLENOL) 325 MG tablet Take 2 tablets (650 mg total) by mouth every 6 (six) hours as needed for mild pain (or Fever >/=  101).    [name]. ALPRAZolam (XANAX) 0.25 MG tablet Take 1 tablet (0.25 mg total) by mouth 3 (three) times daily as needed for anxiety. 30 tablet 0  . apixaban (ELIQUIS) 5 MG TABS tablet Take 1 tablet (5 mg total) by mouth 2 (two) times daily. Resume 09/29/15 60 tablet 6  . aspirin 81 MG EC tablet Take 1 tablet (81 mg total) by mouth daily.    . benzonatate (TESSALON) 200 MG capsule Take 1 capsule (200 mg total) by mouth every 4 (four) hours as needed for cough. 20 capsule 0  . carvedilol (COREG) 3.125 MG tablet Take 1 tablet (3.125 mg total) by mouth 2 (two) times daily with a meal. 60 tablet 0  . donepezil  (ARICEPT) 10 MG tablet Take 1 tablet (10 mg total) by mouth at bedtime. 90 tablet 1  . feeding supplement (BOOST / RESOURCE BREEZE) LIQD Take 1 Container by mouth 3 (three) times daily between meals.  0  . fexofenadine-pseudoephedrine (ALLEGRA-D 24) 180-240 MG 24 hr tablet Take 1 tablet by mouth daily.    . furosemide (LASIX) 20 MG tablet Take 1 tablet (20 mg total) by mouth every Monday, Wednesday, and Friday. 30 tablet 0  . guaiFENesin (MUCINEX) 600 MG 12 hr tablet Take 600 mg by mouth daily.     [name]. levalbuterol (XOPENEX) 0.63 MG/3ML nebulizer solution Take 3 mLs (0.63 mg total) by nebulization every 6 (six) hours as needed for wheezing or shortness of breath. 270 mL 12  . loperamide (IMODIUM) 2 MG capsule Take 1 capsule (2 mg total) by mouth as needed for diarrhea or loose stools. 30 capsule 0  . losartan (COZAAR) 25 MG tablet Take 0.5 tablets (12.5 mg total) by mouth daily. 30 tablet 0  . metaxalone (SKELAXIN) 800 MG tablet TAKE 1 TABLET BY MOUTH 3 TIMES A DAY AS NEEDED FOR PAIN. (Patient taking differently: Take 800 mg by mouth 3 (three) times daily as needed for muscle spasms. ) 90 tablet 1  . mexiletine (MEXITIL) 200 MG capsule Take 1 capsule (200 mg total) by mouth every 8 (eight) hours. 90 capsule 0  . Multiple Vitamins-Minerals (MULTIVITAMIN ADULTS 50+) TABS Take 1 tablet by mouth daily.    . nitroGLYCERIN (NITROSTAT) 0.4 MG SL tablet PLACE 1 TABLET (0.4 MG TOTAL) UNDER THE TONGUE EVERY 5 (FIVE) MINUTES AS NEEDED. FOR CHEST PAIN. 25 tablet 1  . predniSONE (DELTASONE) 20 MG tablet Take 3 tablets (60 mg total) by mouth daily with breakfast. 90 tablet 0  . RANEXA 500 MG 12 hr tablet TAKE 1 TABLET (500 MG TOTAL) BY MOUTH 2 (TWO) TIMES DAILY. 60 tablet 1  . ranitidine (ZANTAC) 150 MG capsule Take 150 mg by mouth daily as needed for heartburn.     . zolpidem (AMBIEN) 5 MG tablet Take 1 tablet (5 mg total) by mouth at bedtime as needed for sleep. 30 tablet 0  . calcium carbonate (TUMS - DOSED IN MG  ELEMENTAL CALCIUM) 500 MG chewable tablet Chew 1 tablet (200 mg of elemental calcium total) by mouth 2 (two) times daily as needed for indigestion or heartburn. (Patient not taking: Reported on 12/19/2015)     Current Facility-Administered Medications on File Prior to Visit  Medication Dose Route Frequency Provider Last Rate Last Dose  . sodium chloride 0.9 % injection 3 mL  3 mL Intravenous Q12H Marinus MawGregg W Taylor, MD      . sodium chloride 0.9 % injection 3 mL  3 mL Intravenous PRN Marinus MawGregg W Taylor,  MD         Review of Systems Constitutional:   No  weight loss, night sweats,  Fevers, chills,  +fatigue, or  lassitude.  HEENT:   No headaches,  Difficulty swallowing,  Tooth/dental problems, or  Sore throat,                No sneezing, itching, ear ache, nasal congestion, post nasal drip,   CV:  No chest pain,  Orthopnea, PND, swelling in lower extremities, anasarca, dizziness, palpitations, syncope.   GI  No heartburn, indigestion, abdominal pain, nausea, vomiting, diarrhea, change in bowel habits, loss of appetite, bloody stools.   Resp:  No chest wall deformity  Skin: no rash or lesions.  GU: no dysuria, change in color of urine, no urgency or frequency.  No flank pain, no hematuria   MS:  No joint pain or swelling.  No decreased range of motion.  No back pain.  Psych:  No change in mood or affect. No depression or anxiety.  No memory loss.         Objective:   Physical Exam Vitals:   12/19/15 1200  BP: 132/70  Pulse: 66  Temp: 97.3 F (36.3 C)  TempSrc: Oral  SpO2: 91%  Weight: 123 lb (55.8 kg)  Height: 5\' 3"  (1.6 m)   GEN: A/Ox3; pleasant , NAD, frail in wc on O2    HEENT:  [place]/AT,  EACs-clear, TMs-wnl, NOSE-clear, THROAT-clear, no lesions, no postnasal drip or exudate noted.   NECK:  Supple w/ fair ROM; no JVD; normal carotid impulses w/o bruits; no thyromegaly or nodules palpated; no lymphadenopathy.    RESP  Faint BB crackles ,  no accessory muscle use, no  dullness to percussion  CARD:  RRR, no m/r/g  , no peripheral edema, pulses intact, no cyanosis or clubbing.  GI:   Soft & nt; nml bowel sounds; no organomegaly or masses detected.   Musco: Warm bil, no deformities or joint swelling noted.   Neuro: alert, no focal deficits noted.    Skin: Warm, no lesions or rashes  Adolphus Hanf NP-C  [facility]  12/19/2015

## 2015-12-20 ENCOUNTER — Encounter: Payer: Medicare Other | Attending: Physical Medicine & Rehabilitation | Admitting: Physical Medicine & Rehabilitation

## 2015-12-20 ENCOUNTER — Encounter: Payer: Self-pay | Admitting: Physical Medicine & Rehabilitation

## 2015-12-20 ENCOUNTER — Telehealth: Payer: Self-pay | Admitting: Adult Health

## 2015-12-20 VITALS — BP 90/64 | HR 73 | Resp 14

## 2015-12-20 DIAGNOSIS — Z823 Family history of stroke: Secondary | ICD-10-CM | POA: Insufficient documentation

## 2015-12-20 DIAGNOSIS — D638 Anemia in other chronic diseases classified elsewhere: Secondary | ICD-10-CM

## 2015-12-20 DIAGNOSIS — Z95810 Presence of automatic (implantable) cardiac defibrillator: Secondary | ICD-10-CM | POA: Diagnosis not present

## 2015-12-20 DIAGNOSIS — Z9841 Cataract extraction status, right eye: Secondary | ICD-10-CM | POA: Insufficient documentation

## 2015-12-20 DIAGNOSIS — Z87891 Personal history of nicotine dependence: Secondary | ICD-10-CM | POA: Diagnosis not present

## 2015-12-20 DIAGNOSIS — I110 Hypertensive heart disease with heart failure: Secondary | ICD-10-CM | POA: Diagnosis not present

## 2015-12-20 DIAGNOSIS — Z8679 Personal history of other diseases of the circulatory system: Secondary | ICD-10-CM | POA: Diagnosis not present

## 2015-12-20 DIAGNOSIS — R195 Other fecal abnormalities: Secondary | ICD-10-CM

## 2015-12-20 DIAGNOSIS — J432 Centrilobular emphysema: Secondary | ICD-10-CM

## 2015-12-20 DIAGNOSIS — F419 Anxiety disorder, unspecified: Secondary | ICD-10-CM | POA: Insufficient documentation

## 2015-12-20 DIAGNOSIS — J440 Chronic obstructive pulmonary disease with acute lower respiratory infection: Secondary | ICD-10-CM | POA: Insufficient documentation

## 2015-12-20 DIAGNOSIS — Z9049 Acquired absence of other specified parts of digestive tract: Secondary | ICD-10-CM | POA: Diagnosis not present

## 2015-12-20 DIAGNOSIS — Z9981 Dependence on supplemental oxygen: Secondary | ICD-10-CM

## 2015-12-20 DIAGNOSIS — Z8719 Personal history of other diseases of the digestive system: Secondary | ICD-10-CM | POA: Diagnosis not present

## 2015-12-20 DIAGNOSIS — I2510 Atherosclerotic heart disease of native coronary artery without angina pectoris: Secondary | ICD-10-CM | POA: Diagnosis not present

## 2015-12-20 DIAGNOSIS — R269 Unspecified abnormalities of gait and mobility: Secondary | ICD-10-CM | POA: Insufficient documentation

## 2015-12-20 DIAGNOSIS — Z950 Presence of cardiac pacemaker: Secondary | ICD-10-CM | POA: Insufficient documentation

## 2015-12-20 DIAGNOSIS — M1990 Unspecified osteoarthritis, unspecified site: Secondary | ICD-10-CM | POA: Insufficient documentation

## 2015-12-20 DIAGNOSIS — I252 Old myocardial infarction: Secondary | ICD-10-CM | POA: Insufficient documentation

## 2015-12-20 DIAGNOSIS — R0902 Hypoxemia: Secondary | ICD-10-CM | POA: Diagnosis not present

## 2015-12-20 DIAGNOSIS — I4891 Unspecified atrial fibrillation: Secondary | ICD-10-CM | POA: Insufficient documentation

## 2015-12-20 DIAGNOSIS — N179 Acute kidney failure, unspecified: Secondary | ICD-10-CM | POA: Diagnosis not present

## 2015-12-20 DIAGNOSIS — I472 Ventricular tachycardia: Secondary | ICD-10-CM | POA: Diagnosis not present

## 2015-12-20 DIAGNOSIS — D72820 Lymphocytosis (symptomatic): Secondary | ICD-10-CM

## 2015-12-20 DIAGNOSIS — D649 Anemia, unspecified: Secondary | ICD-10-CM | POA: Diagnosis not present

## 2015-12-20 DIAGNOSIS — Z808 Family history of malignant neoplasm of other organs or systems: Secondary | ICD-10-CM | POA: Insufficient documentation

## 2015-12-20 DIAGNOSIS — Z801 Family history of malignant neoplasm of trachea, bronchus and lung: Secondary | ICD-10-CM | POA: Insufficient documentation

## 2015-12-20 DIAGNOSIS — T462X1S Poisoning by other antidysrhythmic drugs, accidental (unintentional), sequela: Secondary | ICD-10-CM

## 2015-12-20 DIAGNOSIS — Z833 Family history of diabetes mellitus: Secondary | ICD-10-CM | POA: Insufficient documentation

## 2015-12-20 DIAGNOSIS — J9601 Acute respiratory failure with hypoxia: Secondary | ICD-10-CM

## 2015-12-20 DIAGNOSIS — X58XXXS Exposure to other specified factors, sequela: Secondary | ICD-10-CM | POA: Diagnosis not present

## 2015-12-20 DIAGNOSIS — I5022 Chronic systolic (congestive) heart failure: Secondary | ICD-10-CM | POA: Insufficient documentation

## 2015-12-20 DIAGNOSIS — Z955 Presence of coronary angioplasty implant and graft: Secondary | ICD-10-CM | POA: Insufficient documentation

## 2015-12-20 DIAGNOSIS — R5381 Other malaise: Secondary | ICD-10-CM

## 2015-12-20 NOTE — Assessment & Plan Note (Signed)
Suspected AMiodarone lung toxicity - pt is slowly improving on steroids  Will taper down slowly  Check CXR today  Will need PFT going forward once able .  Once steroids is down can repeat RA factor and Anti -CCP  Plan  Patient Instructions  Continue on Prednisone 60mg  daily for 2 weeks then decrease to 50mg  daily for 2 weeks then 40mg  daily -hold at this dose until seen back in office  Chest xray today .  Follow up with Primary MD as planned for labs.  Continue on oxygen 4l/m . Goal is to keep Oxygen level >90% .  Follow up with Dr. Isaiah Serge in 4 weeks and As needed   Please contact office for sooner follow up if symptoms do not improve or worsen or seek emergency care

## 2015-12-20 NOTE — Assessment & Plan Note (Signed)
O2 demands are going down  Cont to keep sats >90%.   Plan  Patient Instructions  Continue on Prednisone 60mg  daily for 2 weeks then decrease to 50mg  daily for 2 weeks then 40mg  daily -hold at this dose until seen back in office  Chest xray today .  Follow up with Primary MD as planned for labs.  Continue on oxygen 4l/m . Goal is to keep Oxygen level >90% .  Follow up with Dr. Isaiah Serge in 4 weeks and As needed   Please contact office for sooner follow up if symptoms do not improve or worsen or seek emergency care

## 2015-12-20 NOTE — Telephone Encounter (Signed)
lmtcb for Melissa.  

## 2015-12-20 NOTE — Progress Notes (Signed)
Subjective:    Patient ID: [name], male    DOB: [birth year], 70 y.o.   MRN: [id number]  HPI 70 y.o. male with history of CAD, A Fib, ICM s/p AICD, COPD, anxiety disorder, GIB, multiple hospitalizations for HCAP in the past few months presents for transitional care management after receiving CIR for debility after multiple medical issues, including heart, lungs and neurologic, including multiple episodes of desats, "passing out" and chest pain.  Admit date: 12/06/2015 Discharge date: 12/17/2015  History provided by pt, but mainly wife. When leaving the hospital, he had some issued with his medications, particularly his prednisone, but that has been straitened out. At discharge, he was encouraged to with Cardiology, appointment Friday. He saw Pulm yesterday and they continued with current prednisone dose for 2 more weeks.  He sees PCP next week. He is doing okay with walking and taking rest breaks and feels he is doing better than when he was in the hospital.  Pt to follow up regarding workup to rule out auto-immune disorder.  He continues to use oxygen, decreased to 4L.  His PCP is going to follow labs.   DME: Oxygen Mobility: Walker at home, wheelchair in community Therapies: To see therapies, has not started yet.    Pain Inventory Average Pain 4 Pain Right Now 3 My pain is other  In the last 24 hours, has pain interfered with the following? General activity 0 Relation with others 0 Enjoyment of life 0 What TIME of day is your pain at its worst? evening Sleep (in general) Good  Pain is worse with: other Pain improves with: medication Relief from Meds: selection  Mobility walk with assistance use a walker ability to climb steps?  no do you drive?  no use a wheelchair  Function retired  Neuro/Psych weakness trouble walking  Prior Studies x-rays CT/MRI hospital f/u  Physicians involved in your care hospital f/u   Family History  Problem Relation Age of  Onset  . Diabetes Father   . Tracheal cancer Father [id number]    smoker  . Stroke Mother   . Cancer Sister     left eye  . CAD Neg Hx   . Colon cancer Neg Hx   . Prostate cancer Neg Hx    Social History   Social History  . Marital status: Married    Spouse name: N/A  . Number of children: 0  . Years of education: N/A   Occupational History  . [employer] truck driver (retired)    Social History Main Topics  . Smoking status: Former Smoker    Packs/day: 0.50    Years: 50.00    Types: Cigarettes, Cigars    Quit date: 09/27/2015  . Smokeless tobacco: Never Used  . Alcohol use No  . Drug use: No  . Sexual activity: Not Currently   Other Topics Concern  . 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    [id number], [employer]. No known agent orange exposure.     Past Surgical History:  Procedure Laterality Date  . CARDIAC CATHETERIZATION  2004   LAD 30%, D1 30%, CFX-AV groove 70-80%, OM1 30%, EF 20-25%  . CARDIAC CATHETERIZATION N/A 09/28/2015   Procedure: Left Heart Cath and Coronary Angiography;  Surgeon: Peter M Swaziland, MD;  Location: [facility] INVASIVE CV LAB;  Service: Cardiovascular;  Laterality: N/A;  . CARDIAC DEFIBRILLATOR PLACEMENT  2004  . CATARACT EXTRACTION W/ INTRAOCULAR LENS IMPLANT Right 01/2012  . COLONOSCOPY  01/08/2012   Procedure: COLONOSCOPY;  Surgeon: Iva Boop, MD;  Location: [facility];  Service: Endoscopy;  Laterality: N/A;  . CORONARY ANGIOPLASTY  1995   Pt thinks he got a balloon, living in [place], [place]  . IMPLANTABLE CARDIOVERTER DEFIBRILLATOR GENERATOR CHANGE N/A 02/07/2012   Procedure: IMPLANTABLE CARDIOVERTER DEFIBRILLATOR GENERATOR CHANGE;  Surgeon: Marinus Maw, MD; Medtronic Evera XT VR single-chamber serial number [id number], Laterality: Left  .  INSERT / REPLACE / REMOVE PACEMAKER  2004   Medtronic ICD  . KNEE ARTHROSCOPY Left 05/2003   Hattie Perch 06/19/2010  . LAPAROSCOPIC CHOLECYSTECTOMY  1/ 2012  . SHOULDER ARTHROSCOPY W/ ROTATOR CUFF REPAIR Right twice  . TONSILLECTOMY AND ADENOIDECTOMY  ~ 0814   Past Medical History:  Diagnosis Date  . Acute lower GI bleeding 12/11/2011   "first time" (12/11/2011)  . Acute myocardial infarction, unspecified site, episode of care unspecified 1995   Pt living in [place], no stent, ?PTCA  . AICD (automatic cardioverter/defibrillator) present   . Allergic rhinitis, cause unspecified   . Anxiety   . Arthritis    "all over" (11/07/2015)  . Atrial fibrillation (HCC)    a. 05/2015 - converted to sinus in setting of ICD shocks; placed on eliquis 5 bid.  [name] CAD (coronary artery disease), autologous vein bypass graft   . Cervical herniated disc    told not to lift >10 lbs  . Chronic systolic CHF (congestive heart failure), NYHA class 2 (HCC)    Reports EF of 25%.   [name] COPD (chronic obstructive pulmonary disease) (HCC) 11/2012   by xray  . Diverticulosis    by CT scan  . HCAP (healthcare-associated pneumonia) 11/06/2015  . Hypertension   . Insomnia   . Paroxysmal ventricular tachycardia (HCC)   . Perennial allergic rhinitis    only to dust mites  . Pneumonia 2000s   "walking pneumonia"  . VT (ventricular tachycardia) (HCC)    a. 05/2015 - VT storm with multiple ICD shocks-->Amio 400 BID.   BP 90/64 (BP Location: Right Arm, Patient Position: Sitting, Cuff Size: Large)   Pulse 73   Resp 14   SpO2 96%   Opioid Risk Score:   Fall Risk Score:  `1  Depression screen PHQ 2/9  Depression screen [facility] 2/9 12/20/2015 11/17/2015 09/15/2014 08/17/2013 06/25/2012  Decreased Interest 0 0 0 0 0  Down, Depressed, Hopeless 0 0 0 0 0  PHQ - 2 Score 0 0 0 0 0  Altered sleeping 0 - - - -  Tired, decreased energy 0 - - - -  Change in appetite 0 - - - -  Feeling bad or failure about yourself  0 - - - -  Trouble  concentrating 0 - - - -  Moving slowly or fidgety/restless 0 - - - -  Suicidal thoughts 0 - - - -  PHQ-9 Score 0 - - - -  Difficult doing work/chores Not difficult at all - - - -  Some recent data might be hidden    Review of Systems  Constitutional: Positive for unexpected weight change.  HENT: Negative.   Eyes: Negative.   Respiratory: Positive for cough and shortness of breath.   Cardiovascular: Negative.   Gastrointestinal: Positive for diarrhea.  Endocrine: Negative.   Genitourinary:  Negative.   Musculoskeletal: Positive for gait problem.  Neurological: Positive for weakness.  Hematological: Negative.   Psychiatric/Behavioral: Negative.   All other systems reviewed and are negative.      Objective:   Physical Exam Constitutional: He appears cachectic. NAD. Vital signs reviewed.  HENT: Normocephalic and atraumatic.  Eyes: EOMI. No discharge.  Cardiovascular: RRR. No JVD. Respiratory: +[place]. DOE (slightly improved). Lung sounds clear.  GI: Soft. Bowel sounds are normal. He exhibits no distension.  Musculoskeletal: He exhibits no edema or tenderness.  Neurological: He is alert and oriented.  Motor: 4+/5 throughout (unchanged) Skin: Skin is warm and dry.  Not diaphoretic.    Assessment & Plan:  70 y.o. male with history of CAD, A Fib, ICM s/p AICD, COPD, anxiety disorder, GIB, multiple hospitalizations for HCAP in the past few months presents for transitional care management after receiving CIR for debility after multiple medical issues, including heart, lungs and neurologic, including multiple episodes of desats, "passing out", GI bleed, and chest pain.   1.  Weakness and poor activity tolerance secondary to debility from multiple medical issues  Cont to follow up with Pulm, Cards, PCP  Begin therapies next week  Cont meds  2.  Pain Management:   No medications at present  3. Amiodarone toxicity/Centrilobular emphysema with respiratory failure:   Follow up with  Pulm  Cont supplemental O2  Wean steroids per Pulm  CXR reviewed from 11/14, slightly improved             Possibly end stage fibrosis  ?Pulm to follow up for rule out of autoimmune disorder  4. VT/VF s/p AICD:   Cont meds  Cont recs per Cards  5. CAD with chronic systolic CHF with severe LVD:   Cont Cards follow up  Cont meds  6.  Leucocytosis:   Follow up with PCP for routine lab workup                        7.  Anemia:   Follow up with PCP for routine lab workup  8. Chronic loose stools:   Cont immodium             Chronic loose stools after cholecystectomy,   9. AKI  Cont follow up for labs work with PCP  10. Gait abnormality  Mainly due to fatigue and DOE  Cont wheelchair in community  Therapies to start next week  Referrals reviewed Meds reviewed All questions answered

## 2015-12-21 ENCOUNTER — Encounter: Payer: Self-pay | Admitting: *Deleted

## 2015-12-21 ENCOUNTER — Other Ambulatory Visit: Payer: Self-pay | Admitting: *Deleted

## 2015-12-21 NOTE — Progress Notes (Signed)
Cardiology Office Note Date:  12/22/2015  Patient ID:  Randy, [name] 03/02/1945, MRN [id number] PCP:  Crawford Givens, MD  Cardiologist:  Dr. [name]   Chief Complaint:  post hospital f/u  History of Present Illness: [name] is a 70 y.o. male with history of CAD, ICM, VT w/ICD, persisitent A Fib, ICM s/p AICD, COPD, anxiety disorder, GIB, multiple hospitalizations for HCAP in the past few months --most recent for HCAP requiring discharge to home on oxygen. He was readmitted on 11/28/15 with ongoing hypoxia and progressive diffuse bilateral infiltrates. Dr. Tyson Alias felt that infiltrates inflammatory in nature as RA and anti-CCP weakly positive v/s toxicity from amiodarone.   Amiodarone discontinued but he developed VT which accelerated to VF with appropriate ICD therapy. His device was reprogrammed and he was started on Mexiletine for VT suppression.  Eventually d/c to CIR.  While there he was noted to desaturate down to low 70's with activity and required 10-12 liters of high flow oxygen with mobility. Pulmonary was consulted for input and recommended continuing 5-6 liters at rest and pretreating with 10 L for 30-45 minutes prior to any activity.  He did have worsening of symptoms on 11/8 and which was felt to be due to fluid overload per Dr. Molli Knock.  Chest CT was negative for PE. He was diuresed with three dose of IV lasix with resultant acute renal insufficiency and episode of syncope.  Pulmonary recommended discontinuing high flow oxygen and to continue attempts at taper. He was advised to increase fluid intake with improvement in renal status. Discharge was delayed by 24 hours for monitoring and no further presyncope/syncopal episodes. Cardiology was also consulted for input and recommends continuing lasix at 20 mg MWF.  Discharged from [facility] 12/17/15.  He has had f/u with pulmonary and PMD.  He is accompanied by his wife, tells me he is doing pretty well.  He is with a walker today  but states he generally is carrying it rather then having to actually use it.  He sees his PMD next week, saw pulmonary earlier this week with plans do start to slowly down-titrate his steroids in 2 week increments.   He denies any bleeding or signs of bleeding.   He denies any kind of CP, palpitations, no dizziness, near syncope or syncope.  He is still on O2 but feels his SOB is stable.   Device information/history: MDT single chamber ICD, implanted 08/26/02, Dr. [name], VT 11/29/15 started on mexiletine/Ranexa 11/28/15: VT which degenerated into VF with ATP treatment and he received ICD shock 09/27/15: VT episode that he received 2 ATP therapies for and 1 shock, a number of NSVT episodes, and another VT episode he received ATP for that slowed the tachycardia.  Morphology appears that he has 2 different VT's VT storm July 2017 AAD tx: amiodarone loaded/started July 2017 discontinued 2/2 suspect lung tox VT storm in April 2017  Past Medical History:  Diagnosis Date  . Acute lower GI bleeding 12/11/2011   "first time" (12/11/2011)  . Acute myocardial infarction, unspecified site, episode of care unspecified 1995   Pt living in [place], no stent, ?PTCA  . AICD (automatic cardioverter/defibrillator) present   . Allergic rhinitis, cause unspecified   . Anxiety   . Arthritis    "all over" (11/07/2015)  . Atrial fibrillation (HCC)    a. 05/2015 - converted to sinus in setting of ICD shocks; placed on eliquis 5 bid.  [name] CAD (coronary artery disease), autologous vein bypass graft   .  Cervical herniated disc    told not to lift >10 lbs  . Chronic systolic CHF (congestive heart failure), NYHA class 2 (HCC)    Reports EF of 25%.   [name] COPD (chronic obstructive pulmonary disease) (HCC) 11/2012   by xray  . Diverticulosis    by CT scan  . HCAP (healthcare-associated pneumonia) 11/06/2015  . Hypertension   . Insomnia   . Paroxysmal ventricular tachycardia (HCC)   . Perennial allergic rhinitis     only to dust mites  . Pneumonia 2000s   "walking pneumonia"  . VT (ventricular tachycardia) (HCC)    a. 05/2015 - VT storm with multiple ICD shocks-->Amio 400 BID.    Past Surgical History:  Procedure Laterality Date  . CARDIAC CATHETERIZATION  2004   LAD 30%, D1 30%, CFX-AV groove 70-80%, OM1 30%, EF 20-25%  . CARDIAC CATHETERIZATION N/A 09/28/2015   Procedure: Left Heart Cath and Coronary Angiography;  Surgeon: Peter M Swaziland, MD;  [facility];  Service: Cardiovascular;  Laterality: N/A;  . CARDIAC DEFIBRILLATOR PLACEMENT  2004  . CATARACT EXTRACTION W/ INTRAOCULAR LENS IMPLANT Right 01/2012  . COLONOSCOPY  01/08/2012   Procedure: COLONOSCOPY;  Surgeon: Iva Boop, MD;  Location: [facility] ENDOSCOPY;  Service: Endoscopy;  Laterality: N/A;  . CORONARY ANGIOPLASTY  1995   Pt thinks he got a balloon, living in [place], [place]  . IMPLANTABLE CARDIOVERTER DEFIBRILLATOR GENERATOR CHANGE N/A 02/07/2012   Procedure: IMPLANTABLE CARDIOVERTER DEFIBRILLATOR GENERATOR CHANGE;  Surgeon: Marinus Maw, MD; Medtronic Evera XT VR single-chamber serial number [id number], Laterality: Left  . INSERT / REPLACE / REMOVE PACEMAKER  2004   Medtronic ICD  . KNEE ARTHROSCOPY Left 05/2003   Hattie Perch 06/19/2010  . LAPAROSCOPIC CHOLECYSTECTOMY  1/ 2012  . SHOULDER ARTHROSCOPY W/ ROTATOR CUFF REPAIR Right twice  . TONSILLECTOMY AND ADENOIDECTOMY  ~ 1951    Current Outpatient Prescriptions  Medication Sig Dispense Refill  . acetaminophen (TYLENOL) 325 MG tablet Take 2 tablets (650 mg total) by mouth every 6 (six) hours as needed for mild pain (or Fever >/= 101).    [name] ALPRAZolam (XANAX) 0.25 MG tablet Take 1 tablet (0.25 mg total) by mouth 3 (three) times daily as needed for anxiety. 30 tablet 0  . apixaban (ELIQUIS) 5 MG TABS tablet Take 1 tablet (5 mg total) by mouth 2 (two) times daily. Resume 09/29/15 60 tablet 6  . aspirin 81 MG EC tablet Take 1 tablet (81 mg total) by mouth daily.    . benzonatate  (TESSALON) 200 MG capsule Take 1 capsule (200 mg total) by mouth every 4 (four) hours as needed for cough. 20 capsule 0  . calcium carbonate (TUMS - DOSED IN MG ELEMENTAL CALCIUM) 500 MG chewable tablet Chew 1 tablet (200 mg of elemental calcium total) by mouth 2 (two) times daily as needed for indigestion or heartburn.    . carvedilol (COREG) 3.125 MG tablet Take 1 tablet (3.125 mg total) by mouth 2 (two) times daily with a meal. 60 tablet 0  . donepezil (ARICEPT) 10 MG tablet Take 1 tablet (10 mg total) by mouth at bedtime. 90 tablet 1  . feeding supplement (BOOST / RESOURCE BREEZE) LIQD Take 1 Container by mouth 3 (three) times daily between meals.  0  . fexofenadine-pseudoephedrine (ALLEGRA-D 24) 180-240 MG 24 hr tablet Take 1 tablet by mouth daily.    . furosemide (LASIX) 20 MG tablet Take 1 tablet (20 mg total) by mouth every Monday, Wednesday, and  Friday. 30 tablet 0  . guaiFENesin (MUCINEX) 600 MG 12 hr tablet Take 600 mg by mouth daily.     [name] levalbuterol (XOPENEX) 0.63 MG/3ML nebulizer solution Take 3 mLs (0.63 mg total) by nebulization every 6 (six) hours as needed for wheezing or shortness of breath. 270 mL 12  . loperamide (IMODIUM) 2 MG capsule Take 1 capsule (2 mg total) by mouth as needed for diarrhea or loose stools. 30 capsule 0  . losartan (COZAAR) 25 MG tablet Take 0.5 tablets (12.5 mg total) by mouth daily. 30 tablet 0  . metaxalone (SKELAXIN) 800 MG tablet TAKE 1 TABLET BY MOUTH 3 TIMES A DAY AS NEEDED FOR PAIN. (Patient taking differently: Take 800 mg by mouth 3 (three) times daily as needed for muscle spasms. ) 90 tablet 1  . mexiletine (MEXITIL) 200 MG capsule Take 1 capsule (200 mg total) by mouth every 8 (eight) hours. 90 capsule 0  . Multiple Vitamins-Minerals (MULTIVITAMIN ADULTS 50+) TABS Take 1 tablet by mouth daily.    . nitroGLYCERIN (NITROSTAT) 0.4 MG SL tablet PLACE 1 TABLET (0.4 MG TOTAL) UNDER THE TONGUE EVERY 5 (FIVE) MINUTES AS NEEDED. FOR CHEST PAIN. 25 tablet 1    . oxyCODONE-acetaminophen (ROXICET) 5-325 MG/5ML solution Take by mouth every 6 (six) hours as needed for severe pain.    . predniSONE (DELTASONE) 20 MG tablet Take 3 tablets (60 mg total) by mouth daily with breakfast. 90 tablet 0  . promethazine (PHENERGAN) 25 MG tablet Take 25 mg by mouth every 6 (six) hours as needed for nausea or vomiting.    [name] RANEXA 500 MG 12 hr tablet TAKE 1 TABLET (500 MG TOTAL) BY MOUTH 2 (TWO) TIMES DAILY. 60 tablet 1  . ranitidine (ZANTAC) 150 MG capsule Take 150 mg by mouth daily as needed for heartburn.     . zolpidem (AMBIEN) 5 MG tablet Take 1 tablet (5 mg total) by mouth at bedtime as needed for sleep. 30 tablet 0   No current facility-administered medications for this visit.    Facility-Administered Medications Ordered in Other Visits  Medication Dose Route Frequency Provider Last Rate Last Dose  . sodium chloride 0.9 % injection 3 mL  3 mL Intravenous Q12H Marinus Maw, MD      . sodium chloride 0.9 % injection 3 mL  3 mL Intravenous PRN Marinus Maw, MD        Allergies:   Ace inhibitors; Codeine; Doxycycline; Penicillins; Lisinopril; and Statins   Social History:  The patient  reports that he quit smoking about 2 months ago. His smoking use included Cigarettes and Cigars. He has a 25.00 pack-year smoking history. He has never used smokeless tobacco. He reports that he does not drink alcohol or use drugs.   Family History:  The patient's family history includes Cancer in his sister; Diabetes in his father; Stroke in his mother; Tracheal cancer (age of onset: 64) in his father.  ROS:  Please see the history of present illness.  All other systems are reviewed and otherwise negative.   PHYSICAL EXAM:  VS:  BP (!) 108/50   Pulse 67   Ht 5\' 3"  (1.6 m)   Wt 133 lb (60.3 kg)   SpO2 96%   BMI 23.56 kg/m  BMI: Body mass index is 23.56 kg/m. Well nourished, well developed, in no acute distress  HEENT: normocephalic, atraumatic  Neck: no JVD, carotid  bruits or masses Cardiac: RRR no significant murmurs, no rubs, or gallops Lungs:  clear to auscultation bilaterally, no wheezing, rhonchi or rales  Abd: soft, nontender MS: no deformity or atrophy Ext: no edema  Skin: warm and dry, no rash Neuro:  No gross deficits appreciated Psych: euthymic mood, full affect  ICD site is stable, no tethering or discomfort   EKG:  Done 12/15/15 SR, 1st degree AVblock, RBBB ICD interrogation done today and reviewed by myself: *battery and lead status is stable, no VT since his admit/hospital stay  11/30/15: TTE Study Conclusions - Left ventricle: The cavity size was mildly dilated. Wall   thickness was normal. Systolic function was severely reduced. The   estimated ejection fraction was in the range of 25% to 30%. There   is akinesis of the inferolateral and inferior myocardium.   Features are consistent with a pseudonormal left ventricular   filling pattern, with concomitant abnormal relaxation and   increased filling pressure (grade 2 diastolic dysfunction).   Doppler parameters are consistent with high ventricular filling   pressure. - Aortic valve: There was mild regurgitation. - Mitral valve: There was severe regurgitation. - Left atrium: The atrium was moderately dilated. - Right atrium: The atrium was moderately dilated. - Tricuspid valve: There was moderate regurgitation. - Pulmonary arteries: Systolic pressure was moderately increased. Impressions: - Definity used; akinesis of the inferior and inferior lateral wall   with overall severely reduced LV systolic function; grade 2   diastolic dysfunction with elevated LV filling pressure; biatrial   enlargement; mild AI; severe MR; moderate TR with moderately   elevated pulmonary pressure.  09/28/15: LHC Conclusion    Prox RCA to Mid RCA lesion, 100 %stenosed.  Ost LM to LM lesion, 30 %stenosed.  Mid Cx lesion, 70 %stenosed.  Ost 3rd Mrg to 3rd Mrg lesion, 75 %stenosed.  Prox  LAD lesion, 35 %stenosed.  There is severe left ventricular systolic dysfunction.  LV end diastolic pressure is severely elevated.  The left ventricular ejection fraction is less than 25% by visual estimate.  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   1. 2 vessel obstructive CAD. There is chronic total occlusion of the proximal RCA. There is moderate disease in the distal LCx and third OM. This is unchanged from 2004. There is new aneurysmal dilation of the distal left main. 2. Severe LV dysfunction  3. Marked elevation of the LVEDP.  Plan: continue medical therapy. He may benefit from additional diuresis.      Recent Labs: 11/28/2015: B Natriuretic Peptide 494.0; TSH 0.035 12/07/2015: ALT 102 12/15/2015: Hemoglobin 11.3; Magnesium 1.7; Platelets 358 12/16/2015: BUN 36; Creatinine, Ser 1.08; Potassium 4.3; Sodium 137  No results found for requested labs within last 8760 hours.   Estimated Creatinine Clearance: 50.5 mL/min (by C-G formula based on SCr of 1.08 mg/dL).   Wt Readings from Last 3 Encounters:  12/22/15 133 lb (60.3 kg)  12/19/15 123 lb (55.8 kg)  12/17/15 127 lb (57.6 kg)     Other studies reviewed: Additional studies/records reviewed today include: summarized above  ASSESSMENT AND PLAN:  1. VT storm, recurrent w/ICD      None further since his hospital stay     Mexiletine and Ranexa     He is aware, no driving  2. PAFib    Continue Eliquis for CHADS2VASC of 3     Denies any bleeding or signs of bleeding, no melena     He has had prior GI bleeding, last CBC last week stable  3. Chronic CHF (systolic)     c/w CHF  team     Will need to be careful with BB as he has underlying conduction system disease and has required V pacing in the past with amiodarone. He does not have atrial lead.      His weight is up from last though is wearing heavy boots, and clothing today, his exam is euvolemic, OptiVol looks good  4. Amio toxicity     On O2 and steroids      sees pulmonary next week  Disposition: F/u with 3 month remote device check, Dr. Ladona Ridgelaylor in 6 months, sooner if needed.  He will c.w pulmonary as instructed by them.  Current medicines are reviewed at length with the patient today.  The patient did not have any concerns regarding medicines.  Judith BlonderSigned, Jaeli Grubb Ursy, PA-C 12/22/2015 1:43 PM     [facility] [address] [place] [id number] [phone] (office)  [phone]) [phone] (fax)

## 2015-12-21 NOTE — Telephone Encounter (Signed)
lmtcb X1 for Melissa at [facility]. 

## 2015-12-21 NOTE — Telephone Encounter (Signed)
Melissa from [facility] returning call -she can be reached at [phone] -pr

## 2015-12-21 NOTE — Patient Outreach (Signed)
[facility] Customer service manager [facility]) Care Management [facility] Community CM Telephone Outreach, Transition of Care day 4 12/21/2015  [name] May 31, 1945 [id number]  Unsuccessful telephone outreach to [name], wife (on [facility] CM written consent) of patient [name], 70 y/o male referred to [facility] Community CM for transition of care after multiple recent hospitalizations:  October 2-4, 2017; October 13-17, 2017 for HCAP, hypoxia, Acute Respiratory Failure, and AKI secondary to dehydration. Patient noted to have history including but not limited to, COPD, sCHF, CAD with CABG, V-fib with AICD placement, A-Fib, bradycardia, and GERD. Unfortunately, patient was readmitted November 28, 2015- December 06, 2015 for HCAP, Acute on Chronic Respiratory failure, hypoxia and syncope.  Patient was transferred to IP Rehab Unit on December 06, 2015 and was discharged home with home health [facility]) services on December 17, 2015.  Patient had previously requested that I speak to his wife for all follow up outreach, and HIPAA compliant voice mail message was left for patient on wife's cell phone, as requested, asking for call back.  Plan:  [facility] Community CM telephone outreach for transition of care re-scheduled for tomorrow, if I do not hear back from patient or wife first.   Caryl Pina, RN, BSN, [employer] [facility] Care Management  [phone]

## 2015-12-22 ENCOUNTER — Encounter: Payer: Self-pay | Admitting: *Deleted

## 2015-12-22 ENCOUNTER — Other Ambulatory Visit: Payer: Self-pay | Admitting: *Deleted

## 2015-12-22 ENCOUNTER — Ambulatory Visit (INDEPENDENT_AMBULATORY_CARE_PROVIDER_SITE_OTHER): Payer: Medicare Other | Admitting: Physician Assistant

## 2015-12-22 ENCOUNTER — Encounter: Payer: Self-pay | Admitting: Physician Assistant

## 2015-12-22 ENCOUNTER — Telehealth: Payer: Self-pay

## 2015-12-22 VITALS — BP 108/50 | HR 67 | Ht 63.0 in | Wt 133.0 lb

## 2015-12-22 DIAGNOSIS — I5022 Chronic systolic (congestive) heart failure: Secondary | ICD-10-CM

## 2015-12-22 DIAGNOSIS — I472 Ventricular tachycardia, unspecified: Secondary | ICD-10-CM

## 2015-12-22 DIAGNOSIS — I481 Persistent atrial fibrillation: Secondary | ICD-10-CM | POA: Diagnosis not present

## 2015-12-22 DIAGNOSIS — I255 Ischemic cardiomyopathy: Secondary | ICD-10-CM | POA: Diagnosis not present

## 2015-12-22 DIAGNOSIS — I2510 Atherosclerotic heart disease of native coronary artery without angina pectoris: Secondary | ICD-10-CM

## 2015-12-22 DIAGNOSIS — I4819 Other persistent atrial fibrillation: Secondary | ICD-10-CM

## 2015-12-22 NOTE — Telephone Encounter (Signed)
TP please advise if you okay with ordering this. Thanks.

## 2015-12-22 NOTE — Patient Instructions (Addendum)
Medication Instructions:   Your physician recommends that you continue on your current medications as directed. Please refer to the Current Medication list given to you today.   If you need a refill on your cardiac medications before your next appointment, please call your pharmacy.  Labwork: NONE ORDERED  TODAY    Testing/Procedures: NONE ORDERED  TODAY    Follow-Up: Your physician wants you to follow-up in:  IN  6  MONTHS WITH DR Court Joy will receive a reminder letter in the mail two months in advance. If you don't receive a letter, please call our office to schedule the follow-up appointment.   Remote monitoring is used to monitor your Pacemaker of ICD from home. This monitoring reduces the number of office visits required to check your device to one time per year. It allows [place] to keep an eye on the functioning of your device to ensure it is working properly. You are scheduled for a device check from home on..03/22/16..You may send your transmission at any time that day. If you have a wireless device, the transmission will be sent automatically. After your physician reviews your transmission, you will receive a postcard with your next transmission date.     Any Other Special Instructions Will Be Listed Below (If Applicable).

## 2015-12-22 NOTE — Telephone Encounter (Signed)
April nurse with [facility] left v/m;April has tried to see pt x 2 and both times pt had doctors appts. Now April cannot get pt by phone to set up appt time for a visit. April has left messages with  pts contacts and no call back; April wants to know if Dr Para March wants her to continue trying to see pt.

## 2015-12-22 NOTE — Patient Outreach (Addendum)
Triad Customer service manager [facility]) Care Management [facility] Community CM Telephone Outreach, Transition of Care day 5 12/22/2015  [name] 05-13-1945 [id number]  Successful telephone outreach to [name], wife (on [facility] CM written consent) of patient, and patient patient [name], 70 y/o male referred to [facility] Community CM for transition of care after multiple recent hospitalizations:  October 2-4, 2017; October 13-17, 2017 for HCAP, hypoxia, Acute Respiratory Failure, and AKI secondary to dehydration. Patient noted to have history including but not limited to, COPD, sCHF, CAD with CABG, V-fib with AICD placement, A-Fib, bradycardia, and GERD. Unfortunately, patient was readmitted November 28, 2015- December 06, 2015 for HCAP, Acute on Chronic Respiratory failure, hypoxia and syncope.  Patient was transferred to IP Rehab Unit on December 06, 2015 and was discharged home with home health [facility]) services on December 17, 2015.  HIPAA/ identity verified with patient and his patient's wife today, as patient called me at 10:15 am, stating that he was on his way to cardiology appointment, but would like for me to call him back this afternoon.  Called back and spoke with patient's wife at 3:15 pm.  Today, [name] reports that patient "is much better," again stating that they "both feel overwhelmed after everything" patient has been through.  [name] reports that patient is currently lying down, as he "was very tired" after his cardiology provider appointment today.  -- Patient attended all scheduled provider appointments this week:  Pulmonology, cardiology, Rehab providers; [name] reports she provides transportation for patient to all appointments.  [name] states "all appointments have gone smoothly."  Verbalizes plans to attend PCP office visit on Monday 12/25/15 at 12:15 pm.  -- Home Health [facility]) services: [name] states that she has heard from [facility] agency, ([place] [facility]  [phone]) but states that services have not yet arrives due to so many patient provider appointments this week.  [name] again voiced plans to contact [facility] agency, stating that she has their number, and I shared with [name] that I too would follow up with [facility] agency to confirm services.   -- Medications: Patient and his wife report that patient has all medications and is taking as prescribed;  she states that she is unable to complete medication reconciliation today, as "too much is going on" after patient's doctor appointment this afternoon.  [name] agreed to go over medications with me during initial [facility] CM in-home visit, which was scheduled today for later this month.  [name] stated that she prepares and manages patient medications and verbalized that she believes she has a good understanding of patient medications.  [name] states that patient has not needed to use his nebulizer since he came home from the hospital, stating that she is uncomfortable using, after patient passed out while using and was then re-hospitalized.  [name] reports that she has made all providers aware that patient has not needed/ used HHN since most recent hospital discharge.  Currently using home O2 at 4 L/min via [place].  -- General Safety:  [name] reports that patient is using walker "more and more," while having to use "mobility chair" "less and less."  States that patient is tolerating slow/ gradual increases in activity without distress.  -- [employer] needs:  [name] denies need for community resources at present.  Reports has a local son and daughter, but states that she "is primary caregiver."  -- self-health management of chronic disease state of COPD/ CHF:  [name] was unsure if she had been given discharge instructions/ packets for COPD/ CHF zones.  We covered zones today during our phone call.  Reports that patient has not been monitoring/ recording daily weights, but  was instructed to start doing so at cardiology appointment today; states that they will start doing.  Reports patient is currently monitoring SaO2 levels, which are "staying at 89-92" consistently.   [facility] Community CM services were again discussed with patient's wife, including how HH services differs from [facility] Community CM. I confirmed that Mrs. Lukasik had my direct phone number, the main office number for [facility] CM, and the 24-hour nurse advice line, and we were able to schedule initial [facility] CM in-home visit for later this month.  [name] denies further questions, concerns, problems or issues today.  Plan:  [name] will take his medications as they are prescribed and will attend all scheduled provider appointments.  [name] will promptly notify his providers for any concerns, issues, or problems that arise.  Mrs. Minervini and [facility] RN CM [facility] agency regarding Hh services ordered post-hospital discharge.  [name] will begin monitoring and recording his daily weights.  [facility] Community CM outreach for transition of care to continue with scheduled phone call next week, and initial home visit later this month.  Randy PinaLaine Mckinney Bev Drennen, RN, BSN, [employer] Community Care Coordinator [facility] Care Management  [phone]

## 2015-12-23 NOTE — Telephone Encounter (Signed)
Yes, please. Thanks!

## 2015-12-25 ENCOUNTER — Ambulatory Visit (INDEPENDENT_AMBULATORY_CARE_PROVIDER_SITE_OTHER): Payer: Medicare Other | Admitting: Family Medicine

## 2015-12-25 ENCOUNTER — Telehealth: Payer: Self-pay | Admitting: *Deleted

## 2015-12-25 ENCOUNTER — Encounter: Payer: Self-pay | Admitting: Family Medicine

## 2015-12-25 ENCOUNTER — Other Ambulatory Visit: Payer: Self-pay | Admitting: *Deleted

## 2015-12-25 VITALS — BP 110/74 | HR 81 | Temp 97.5°F | Wt 135.2 lb

## 2015-12-25 DIAGNOSIS — I255 Ischemic cardiomyopathy: Secondary | ICD-10-CM | POA: Diagnosis not present

## 2015-12-25 DIAGNOSIS — R0602 Shortness of breath: Secondary | ICD-10-CM | POA: Diagnosis not present

## 2015-12-25 LAB — COMPREHENSIVE METABOLIC PANEL
ALBUMIN: 2.9 g/dL — AB (ref 3.5–5.2)
ALK PHOS: 89 U/L (ref 39–117)
ALT: 66 U/L — ABNORMAL HIGH (ref 0–53)
AST: 40 U/L — AB (ref 0–37)
BUN: 27 mg/dL — AB (ref 6–23)
CO2: 31 mEq/L (ref 19–32)
CREATININE: 0.91 mg/dL (ref 0.40–1.50)
Calcium: 8.5 mg/dL (ref 8.4–10.5)
Chloride: 105 mEq/L (ref 96–112)
GFR: 87.15 mL/min (ref >60.00)
Glucose, Bld: 106 mg/dL — ABNORMAL HIGH (ref 70–99)
Potassium: 5.1 mEq/L (ref 3.5–5.1)
SODIUM: 140 meq/L (ref 135–145)
TOTAL PROTEIN: 5.6 g/dL — AB (ref 6.0–8.3)
Total Bilirubin: 0.4 mg/dL (ref 0.2–1.2)

## 2015-12-25 LAB — CBC WITH DIFFERENTIAL/PLATELET
Basophils Absolute: 0 10*3/uL (ref 0.0–0.1)
Basophils Relative: 0.2 % (ref 0.0–3.0)
EOS PCT: 0.9 % (ref 0.0–5.0)
Eosinophils Absolute: 0.1 10*3/uL (ref 0.0–0.7)
HCT: 35 % — ABNORMAL LOW (ref 39.0–52.0)
HEMOGLOBIN: 11.1 g/dL — AB (ref 13.0–17.0)
Lymphocytes Relative: 10.2 % — ABNORMAL LOW (ref 12.0–46.0)
Lymphs Abs: 1.4 10*3/uL (ref 0.7–4.0)
MCHC: 31.8 g/dL (ref 30.0–36.0)
MCV: 86.7 fl (ref 78.0–100.0)
MONO ABS: 0.6 10*3/uL (ref 0.1–1.0)
Monocytes Relative: 4.6 % (ref 3.0–12.0)
Neutro Abs: 11.6 10*3/uL — ABNORMAL HIGH (ref 1.4–7.7)
Neutrophils Relative %: 84.1 % — ABNORMAL HIGH (ref 43.0–77.0)
Platelets: 321 10*3/uL (ref 150.0–400.0)
RBC: 4.04 Mil/uL — AB (ref 4.22–5.81)
RDW: 17.7 % — ABNORMAL HIGH (ref 11.5–15.5)
WBC: 13.8 10*3/uL — AB (ref 4.0–10.5)

## 2015-12-25 MED ORDER — PREDNISONE 20 MG PO TABS: 50.0000 mg | ORAL_TABLET | Freq: Every day | ORAL | Status: DC

## 2015-12-25 NOTE — Assessment & Plan Note (Signed)
>  25 minutes spent in face to face time with patient, >50% spent in counselling or coordination of care.  Continue O2 at 4 L per nasal cannula. He can try tapering down to 3.5 L his long as his sats are above 90%. Discussed with patient and wife. Still okay for outpatient follow-up.  Check routine labs today. See notes on labs.  Once steroids are down then pulmonary can repeat RA factor and Anti -CCP.   Continue with home exercises with walking. No other changes in medicine at this point. Okay for outpatient follow-up. I will await notes from other docs.

## 2015-12-25 NOTE — Patient Outreach (Signed)
Tri[facility] Customer service manager [facility]) Care Management [facility] Community CM Telephone Outreach, Care Coordination 12/25/2015  ULYSEE MEDLEN November 27, 1945 [id number]  Successful telephone outreach to Etheleen Sia, Clinical Manager at [facility] [phone]) regarding Brittin Cease, 70 y/o male referred to [facility] Community CM for transition of care after multiple recent hospitalizations: October 2-4, 2017; October 13-17, 2017 for HCAP, hypoxia, Acute Respiratory Failure,and AKI secondary to dehydration.Patient noted to have history including but not limited to, COPD, sCHF, CAD with CABG, V-fib with AICD placement, A-Fib, bradycardia, and GERD. Unfortunately, patient wasreadmitted November 28, 2015- November 1, 2017for HCAP, Acute on Chronic Respiratory failure, hypoxia and syncope. Patient was transferred to IP Rehab Unit on December 06, 2015 and was discharged home with home health [facility]) services on December 17, 2015.  Call was placed today to confirm [facility] services for patient, as patient's wife reported last week that [facility] services had not yet started, due to patient's scheduling of multiple provider appointments post-hospital discharge.  Today, BJ reported that [facility] services for RN, PT, and OT are currently active, but shared that the [facility] agency staff had not been able to successfully schedule start of services with patient.  BJ reported that [facility] staff would resume efforts to connect with patient this week to schedule initiation of HH services.  Plan:  Will continue to collaborate with [facility] services as needed.  Will make patient/ spouse aware that I have spoken to [facility] agency with scheduled telephone outreach for transition of care tomorrow.   Caryl Pina, RN, BSN, [employer] [facility] Care Management  [phone]

## 2015-12-25 NOTE — Telephone Encounter (Signed)
Patient coming in for hospital follow up today.  Dr. Algis Downs will discuss this with the patient at that time.

## 2015-12-25 NOTE — Patient Instructions (Addendum)
Please call April with [facility] healthcare.   Per pulmonary: goal is to keep oxygen level >90%.  I would keep it at 4L at night in the meantime.  Take care.  Glad to see you.  We'll contact you with your lab report. I'll await the notes from your other docs.

## 2015-12-25 NOTE — Telephone Encounter (Signed)
Thanks

## 2015-12-25 NOTE — Progress Notes (Signed)
Inpatient f/u, inpatient course discussed with patient.  Admitted with multifactorial shortness of breath with concern for both cardiac and pulmonary sources. Found to likely have possible amiodarone toxicity causing shortness of breath. On prednisone taper currently with supplemental oxygen. Not having chest pain. Of all of his healthcare considerations he is he is most concerned/annoyed with O2 use.  Now with minimal cough.  No sputum.  No fevers.  He is walking with walker, getting around the house.  He can clearly walk farther now than when he started rehab.    Currently on 4L O2.  He needs a parking sticker- done at [facility].  He has tried 3.5L O2 occasionally during the day but not at night, with sats still >90%.  Currently taking Lasix MWF.  Due for follow-up labs.   PMH and SH reviewed  ROS: Per HPI unless specifically indicated in ROS section   Meds, vitals, and allergies reviewed.   GEN: nad, alert and oriented HEENT: mucous membranes moist NECK: supple w/o LA CV: rrr.  PULM: ctab overall with no focal dec in bs, no inc wob ABD: soft, +bs EXT: no edema SKIN: no acute rash

## 2015-12-25 NOTE — Telephone Encounter (Signed)
[name] from home health contacted office stating theree was a delay in pts home health visits. He has denied services and they were cancelled. She then received a call requesting that another attempt be made to schedule tentative visit. [name] states that she has left a message with the pt and is awaiting a call to schedule. Wanted to advise Dr Para March as Lorain Childes

## 2015-12-25 NOTE — Progress Notes (Signed)
Pre visit review using our clinic review tool, if applicable. No additional management support is needed unless otherwise documented below in the visit note. 

## 2015-12-26 ENCOUNTER — Other Ambulatory Visit: Payer: Self-pay | Admitting: *Deleted

## 2015-12-26 ENCOUNTER — Encounter: Payer: Self-pay | Admitting: *Deleted

## 2015-12-26 ENCOUNTER — Other Ambulatory Visit: Payer: Self-pay | Admitting: Family Medicine

## 2015-12-26 DIAGNOSIS — R7989 Other specified abnormal findings of blood chemistry: Secondary | ICD-10-CM

## 2015-12-26 DIAGNOSIS — R945 Abnormal results of liver function studies: Secondary | ICD-10-CM

## 2015-12-26 NOTE — Patient Outreach (Signed)
Triad Customer service manager [facility]) Care Management [facility] Community CM Telephone Outreach, Transition of Care day 9 12/26/2015  [name] 1945/09/13 [id number]  Successful telephone outreach to [name], wife (on [facility] CM written consent) of patient, and patient patient [name], 70 y/o male referred to [facility] Community CM for transition of care after multiple recent hospitalizations: October 2-4, 2017; October 13-17, 2017 for HCAP, hypoxia, Acute Respiratory Failure,and AKI secondary to dehydration.Patient noted to have history including but not limited to, COPD, sCHF, CAD with CABG, V-fib with AICD placement, A-Fib, bradycardia, and GERD. Unfortunately, patient wasreadmitted November 28, 2015- November 1, 2017for HCAP, Acute on Chronic Respiratory failure, hypoxia and syncope. Patient was transferred to IP Rehab Unit on December 06, 2015 and was discharged home with home health [facility]) services on December 17, 2015.  HIPAA/ identity verified with patient's wife today.  Today, [name] reports that patient "continues doing much better."  -- Patient attended scheduled provider appointments (PCP) this week: [name] states "appointments went well-- he got a good report."  [name] reports no changes in overall plan of care as result of PCP visit yesterday.  -- Home Health [facility]) services: [name] states that she heard from [facility] agency this morning, ([place] [facility] [phone]) and confirms that services are to start tomorrow with RN and OT visits, and PT visit scheduled for Friday 12/29/15.  I encouraged [name] to share with patient value of HH services, and encouraged active participation with all [facility] services, and [name] agreed.   -- Medications: Wife reports that patient has all medications and is taking as prescribed; she states that she is unable to complete medication reconciliation today, as she is "running errands this afternoon" and is not near his  medications.  [name] agreed to go over medications with me during initial [facility] CM in-home visit, which is scheduled for next week.  Wife confirms again today that all providers have been made aware that patient has not needed/ used HHN since most recent hospital discharge.  Currently using home O2 at 3.5 L/min via [place] with SaO2 levels "running 95-98%."  [name] again confirms that patient is tolerating slow/ gradual increases in activity without distress.  -- Self-health management of chronic disease state of COPD/ CHF:  [name] reports that patient has been monitoring/ recording daily weights, stating that his "weights have stayed in the same general range without drastic changes."  Reports that patient has not had any episodes of shortness of breath or dyspnea.  I confirmed that [name] hadmy direct phone number, the main office number for [facility] CM, and the 24-hour nurse advice line, and confirmed previously scheduled initial [facility] CM in-home visit for next week.  Mrs. Bosleydeniesfurther questions, concerns, problems or issues today.  Plan:  [name] will take his medications as they are prescribed and will attend all scheduled provider appointments.  [name] will promptly notify his providers for any concerns, issues, or problems that arise.  [name] will actively participate with [facility] services as ordered post-hospital discharge.  [name] will continue monitoring and recording his daily weights.  [facility] Community CM outreach for transition of care to continue with initial home visit next week.  Randy Pina, RN, BSN, [employer] [facility] Care Management  [phone]

## 2015-12-26 NOTE — Patient Outreach (Signed)
Triad Customer service manager [facility]) Care Management [facility] Community CM Telephone Outreach, Care Coordination 12/26/2015  [name] November 27, 1945 [id number]  Successful incoming call received from [name], Clinical Manager for [facility]) agency [phone]) regarding [name], 70 y/o male referred to [facility] Community CM for transition of care after multiple recent hospitalizations: October 2-4, 2017; October 13-17, 2017 for HCAP, hypoxia, Acute Respiratory Failure,and AKI secondary to dehydration.Patient noted to have history including but not limited to, COPD, sCHF, CAD with CABG, V-fib with AICD placement, A-Fib, bradycardia, and GERD. Unfortunately, patient wasreadmitted November 28, 2015- November 1, 2017for HCAP, Acute on Chronic Respiratory failure, hypoxia and syncope. Patient was transferred to IP Rehab Unit on December 06, 2015 and was discharged home with home health [facility]) services on December 17, 2015.  Call today was to follow up on yesterday's care coordination calls, to confirm that Randy had established contact with patient and patient's spouse; Randy confirmed that [facility] services would start tomorrow, Wednesday 11/22 for RN and OT, and that [facility] PT would start on Friday 11/24.  Plan:  Will collaborate with [facility] services for coordination of care as needed.  Randy Pina, RN, BSN, [employer] [facility] Care Management  [phone]

## 2015-12-27 ENCOUNTER — Telehealth: Payer: Self-pay

## 2015-12-27 DIAGNOSIS — Z8701 Personal history of pneumonia (recurrent): Secondary | ICD-10-CM | POA: Diagnosis not present

## 2015-12-27 DIAGNOSIS — Z87891 Personal history of nicotine dependence: Secondary | ICD-10-CM | POA: Diagnosis not present

## 2015-12-27 DIAGNOSIS — T462X5D Adverse effect of other antidysrhythmic drugs, subsequent encounter: Secondary | ICD-10-CM | POA: Diagnosis not present

## 2015-12-27 DIAGNOSIS — I252 Old myocardial infarction: Secondary | ICD-10-CM | POA: Diagnosis not present

## 2015-12-27 DIAGNOSIS — Z6822 Body mass index (BMI) 22.0-22.9, adult: Secondary | ICD-10-CM | POA: Diagnosis not present

## 2015-12-27 DIAGNOSIS — J449 Chronic obstructive pulmonary disease, unspecified: Secondary | ICD-10-CM | POA: Diagnosis not present

## 2015-12-27 DIAGNOSIS — E440 Moderate protein-calorie malnutrition: Secondary | ICD-10-CM | POA: Diagnosis not present

## 2015-12-27 DIAGNOSIS — Z955 Presence of coronary angioplasty implant and graft: Secondary | ICD-10-CM | POA: Diagnosis not present

## 2015-12-27 DIAGNOSIS — Z7901 Long term (current) use of anticoagulants: Secondary | ICD-10-CM | POA: Diagnosis not present

## 2015-12-27 DIAGNOSIS — I5023 Acute on chronic systolic (congestive) heart failure: Secondary | ICD-10-CM | POA: Diagnosis not present

## 2015-12-27 DIAGNOSIS — R413 Other amnesia: Secondary | ICD-10-CM | POA: Diagnosis not present

## 2015-12-27 DIAGNOSIS — Z95810 Presence of automatic (implantable) cardiac defibrillator: Secondary | ICD-10-CM | POA: Diagnosis not present

## 2015-12-27 DIAGNOSIS — Z9981 Dependence on supplemental oxygen: Secondary | ICD-10-CM | POA: Diagnosis not present

## 2015-12-27 DIAGNOSIS — I110 Hypertensive heart disease with heart failure: Secondary | ICD-10-CM | POA: Diagnosis not present

## 2015-12-27 DIAGNOSIS — I255 Ischemic cardiomyopathy: Secondary | ICD-10-CM | POA: Diagnosis not present

## 2015-12-27 DIAGNOSIS — I482 Chronic atrial fibrillation: Secondary | ICD-10-CM | POA: Diagnosis not present

## 2015-12-27 DIAGNOSIS — G4700 Insomnia, unspecified: Secondary | ICD-10-CM | POA: Diagnosis not present

## 2015-12-27 DIAGNOSIS — I2510 Atherosclerotic heart disease of native coronary artery without angina pectoris: Secondary | ICD-10-CM | POA: Diagnosis not present

## 2015-12-27 DIAGNOSIS — Z9181 History of falling: Secondary | ICD-10-CM | POA: Diagnosis not present

## 2015-12-27 DIAGNOSIS — J9621 Acute and chronic respiratory failure with hypoxia: Secondary | ICD-10-CM | POA: Diagnosis not present

## 2015-12-27 NOTE — Telephone Encounter (Signed)
Ativan, ccupational therapist calling to get a verbal order for OT. 2 times a week for 3 weeks   [phone]

## 2015-12-27 NOTE — Telephone Encounter (Signed)
Spoke to [place] and provided verbal orders. Tresa Endo out of office

## 2015-12-27 NOTE — Telephone Encounter (Signed)
Kelly from [place] [facility] calling because of a drug interaction between aspirin and eliquis. The interaction is an increase anticoagulant effect. She also needs a verbal order skilled nurse visit for frequency of 2 week 3.    [phone] (Nurse left different phone number, but can't hear the last 2 numbers.)

## 2015-12-27 NOTE — Telephone Encounter (Signed)
Please give the order.  Thanks.   

## 2015-12-27 NOTE — Telephone Encounter (Signed)
I would continue the ASA and eliquis.  Interaction noted but I would still continue as is.   Please give the order otherwise.  Thanks.

## 2015-12-27 NOTE — Telephone Encounter (Signed)
Spoke to Cindy and provided verbal order as requested 

## 2016-01-01 ENCOUNTER — Encounter: Payer: Self-pay | Admitting: *Deleted

## 2016-01-01 ENCOUNTER — Other Ambulatory Visit: Payer: Self-pay | Admitting: *Deleted

## 2016-01-01 NOTE — Telephone Encounter (Signed)
That is fine 

## 2016-01-01 NOTE — Telephone Encounter (Signed)
TP please advise. thanks 

## 2016-01-01 NOTE — Patient Outreach (Signed)
Tri[facility] Customer service manager [facility]) Care Management [facility] Community CM Telephone Outreach, Transition of Care day 15 01/01/2016  [name] [birth year] [id number]  Successful telephone outreach to [name], 70 y/o male referred to [facility] Community CM for transition of care after multiple recent hospitalizations: October 2-4, 2017; October 13-17, 2017 for HCAP, hypoxia, Acute Respiratory Failure,and AKI secondary to dehydration.Patient noted to have history including but not limited to, COPD, sCHF, CAD with CABG, V-fib with AICD placement, A-Fib, bradycardia, and GERD. Unfortunately, patient wasreadmitted November 28, 2015- November 1, 2017for HCAP, Acute on Chronic Respiratory failure, hypoxia and syncope. Patient was transferred to IP Rehab Unit on December 06, 2015 and was discharged home with home health [facility]) services on December 17, 2015.  HIPAA/ identity verified.  Call to patient today was in follow up to secure message I received through EMR from [facility], [facility] CMA, to alert me that she had spoken to patient who asked to cancel his upcoming [facility] Community CM in-home visit, planned for Wednesday January 03, 2016.  When [name] answered his phone, he tersely said, "why are you calling me?  We cancelled the visit we had with you."  I explained that I was calling for more information, and to inquire if he wished to re-schedule the home visit appointment for later this week that we had previously made.  To that, [name] said, "we left a message so we wouldn't have to have any more phone calls... That was the whole point of leaving the message."  [name] then clarified with me that he wishes to "have no more phone calls, and no home visits.... I will call you if I need you," and then he abruptly hung the phone up.   Plan:  Will close [facility] Community CM program, as patient has requested.  Caryl Pina, RN, BSN, [employer] [facility] Care Management   [phone]

## 2016-01-01 NOTE — Patient Outreach (Signed)
[employer] Customer service manager [facility]) Care Management [facility] Community CM Telephone Outreach, Care Coordination 01/01/2016  RANA BARRUS [birth year] [id number]  Successful telephone outreach to Etheleen Sia, Clinical Manager at [facility] [phone]) regarding Jalmer Tooker, 70 y/o male referred to [facility] CM for transition of care after multiple recent hospitalizations: October 2-4, 2017; October 13-17, 2017 for HCAP, hypoxia, Acute Respiratory Failure,and AKI secondary to dehydration.Patient noted to have history including but not limited to, COPD, sCHF, CAD with CABG, V-fib with AICD placement, A-Fib, bradycardia, and GERD. Unfortunately, patient wasreadmitted November 28, 2015- November 1, 2017for HCAP, Acute on Chronic Respiratory failure, hypoxia and syncope. Patient was transferred to IP Rehab Unit on December 06, 2015 and was discharged home with home health [facility]) services on December 17, 2015.  Care coordination call placed today to inform BJ that patient had voluntarily withdrawn from [facility] CM program, and that [facility] CM was no longer active in patient's care.  BJ voiced understanding of same.   Caryl Pina, RN, BSN, [employer] [facility] Care Management  [phone]

## 2016-01-01 NOTE — Telephone Encounter (Signed)
Order placed Called spoke with Melissa, she is aware Nothing further needed; will sign off

## 2016-01-02 ENCOUNTER — Telehealth: Payer: Self-pay

## 2016-01-02 DIAGNOSIS — I110 Hypertensive heart disease with heart failure: Secondary | ICD-10-CM | POA: Diagnosis not present

## 2016-01-02 DIAGNOSIS — I482 Chronic atrial fibrillation: Secondary | ICD-10-CM | POA: Diagnosis not present

## 2016-01-02 DIAGNOSIS — J449 Chronic obstructive pulmonary disease, unspecified: Secondary | ICD-10-CM | POA: Diagnosis not present

## 2016-01-02 DIAGNOSIS — I5023 Acute on chronic systolic (congestive) heart failure: Secondary | ICD-10-CM | POA: Diagnosis not present

## 2016-01-02 DIAGNOSIS — T462X5D Adverse effect of other antidysrhythmic drugs, subsequent encounter: Secondary | ICD-10-CM | POA: Diagnosis not present

## 2016-01-02 DIAGNOSIS — J9621 Acute and chronic respiratory failure with hypoxia: Secondary | ICD-10-CM | POA: Diagnosis not present

## 2016-01-02 NOTE — Telephone Encounter (Signed)
Gae Dry (therapist) with [facility] [phone]) calls in to report that patient is noncompliant and difficult with her schedule and she has attempted 4 times to see the patient.  He has either cancelled, refused to see or turned her away at each time.  OT has been able to see him and says overall he is doing well.  Gae Dry will make 1 more attempt but if unsuccessful will need to close patient out to care services.  Please let her know if you have any further questions regarding this situation.

## 2016-01-03 ENCOUNTER — Ambulatory Visit: Payer: Self-pay | Admitting: *Deleted

## 2016-01-03 NOTE — Telephone Encounter (Signed)
Left a message on voicemail for [name] to call back.

## 2016-01-03 NOTE — Telephone Encounter (Signed)
Agreed, thanks

## 2016-01-03 NOTE — Telephone Encounter (Signed)
Deena notified as instructed by telephone and verbalized understanding.

## 2016-01-04 DIAGNOSIS — J449 Chronic obstructive pulmonary disease, unspecified: Secondary | ICD-10-CM | POA: Diagnosis not present

## 2016-01-04 DIAGNOSIS — I110 Hypertensive heart disease with heart failure: Secondary | ICD-10-CM | POA: Diagnosis not present

## 2016-01-04 DIAGNOSIS — J9621 Acute and chronic respiratory failure with hypoxia: Secondary | ICD-10-CM | POA: Diagnosis not present

## 2016-01-04 DIAGNOSIS — I482 Chronic atrial fibrillation: Secondary | ICD-10-CM | POA: Diagnosis not present

## 2016-01-04 DIAGNOSIS — I5023 Acute on chronic systolic (congestive) heart failure: Secondary | ICD-10-CM | POA: Diagnosis not present

## 2016-01-04 DIAGNOSIS — T462X5D Adverse effect of other antidysrhythmic drugs, subsequent encounter: Secondary | ICD-10-CM | POA: Diagnosis not present

## 2016-01-05 DIAGNOSIS — J449 Chronic obstructive pulmonary disease, unspecified: Secondary | ICD-10-CM | POA: Diagnosis not present

## 2016-01-05 DIAGNOSIS — I5023 Acute on chronic systolic (congestive) heart failure: Secondary | ICD-10-CM | POA: Diagnosis not present

## 2016-01-05 DIAGNOSIS — J9621 Acute and chronic respiratory failure with hypoxia: Secondary | ICD-10-CM | POA: Diagnosis not present

## 2016-01-05 DIAGNOSIS — I482 Chronic atrial fibrillation: Secondary | ICD-10-CM | POA: Diagnosis not present

## 2016-01-05 DIAGNOSIS — I110 Hypertensive heart disease with heart failure: Secondary | ICD-10-CM | POA: Diagnosis not present

## 2016-01-05 DIAGNOSIS — T462X5D Adverse effect of other antidysrhythmic drugs, subsequent encounter: Secondary | ICD-10-CM | POA: Diagnosis not present

## 2016-01-09 DIAGNOSIS — I5023 Acute on chronic systolic (congestive) heart failure: Secondary | ICD-10-CM | POA: Diagnosis not present

## 2016-01-09 DIAGNOSIS — T462X5D Adverse effect of other antidysrhythmic drugs, subsequent encounter: Secondary | ICD-10-CM | POA: Diagnosis not present

## 2016-01-09 DIAGNOSIS — J449 Chronic obstructive pulmonary disease, unspecified: Secondary | ICD-10-CM | POA: Diagnosis not present

## 2016-01-09 DIAGNOSIS — J9621 Acute and chronic respiratory failure with hypoxia: Secondary | ICD-10-CM | POA: Diagnosis not present

## 2016-01-09 DIAGNOSIS — I482 Chronic atrial fibrillation: Secondary | ICD-10-CM | POA: Diagnosis not present

## 2016-01-09 DIAGNOSIS — I110 Hypertensive heart disease with heart failure: Secondary | ICD-10-CM | POA: Diagnosis not present

## 2016-01-11 DIAGNOSIS — I110 Hypertensive heart disease with heart failure: Secondary | ICD-10-CM | POA: Diagnosis not present

## 2016-01-11 DIAGNOSIS — I5023 Acute on chronic systolic (congestive) heart failure: Secondary | ICD-10-CM | POA: Diagnosis not present

## 2016-01-11 DIAGNOSIS — J449 Chronic obstructive pulmonary disease, unspecified: Secondary | ICD-10-CM | POA: Diagnosis not present

## 2016-01-11 DIAGNOSIS — J9621 Acute and chronic respiratory failure with hypoxia: Secondary | ICD-10-CM | POA: Diagnosis not present

## 2016-01-11 DIAGNOSIS — T462X5D Adverse effect of other antidysrhythmic drugs, subsequent encounter: Secondary | ICD-10-CM | POA: Diagnosis not present

## 2016-01-11 DIAGNOSIS — I482 Chronic atrial fibrillation: Secondary | ICD-10-CM | POA: Diagnosis not present

## 2016-01-15 ENCOUNTER — Telehealth: Payer: Self-pay

## 2016-01-15 DIAGNOSIS — I110 Hypertensive heart disease with heart failure: Secondary | ICD-10-CM | POA: Diagnosis not present

## 2016-01-15 DIAGNOSIS — I5023 Acute on chronic systolic (congestive) heart failure: Secondary | ICD-10-CM | POA: Diagnosis not present

## 2016-01-15 DIAGNOSIS — I482 Chronic atrial fibrillation: Secondary | ICD-10-CM | POA: Diagnosis not present

## 2016-01-15 DIAGNOSIS — J449 Chronic obstructive pulmonary disease, unspecified: Secondary | ICD-10-CM | POA: Diagnosis not present

## 2016-01-15 DIAGNOSIS — J9621 Acute and chronic respiratory failure with hypoxia: Secondary | ICD-10-CM | POA: Diagnosis not present

## 2016-01-15 DIAGNOSIS — T462X5D Adverse effect of other antidysrhythmic drugs, subsequent encounter: Secondary | ICD-10-CM | POA: Diagnosis not present

## 2016-01-15 NOTE — Telephone Encounter (Signed)
Tresa Endo with [facility] advised.

## 2016-01-15 NOTE — Telephone Encounter (Signed)
If they need to cease f/u with him then I'll defer to them.  Thanks.

## 2016-01-15 NOTE — Telephone Encounter (Signed)
Kelly nurse with [facility] left v/m; pt has not been compliant with HH appts. Tresa Endo has called pt multiple times and pt has missed 2 HH visits in last couple of weeks.

## 2016-01-16 ENCOUNTER — Ambulatory Visit (INDEPENDENT_AMBULATORY_CARE_PROVIDER_SITE_OTHER): Payer: Medicare Other | Admitting: Pulmonary Disease

## 2016-01-16 ENCOUNTER — Encounter: Payer: Self-pay | Admitting: Pulmonary Disease

## 2016-01-16 VITALS — BP 120/72 | HR 57 | Ht 63.0 in | Wt 140.0 lb

## 2016-01-16 DIAGNOSIS — J984 Other disorders of lung: Secondary | ICD-10-CM

## 2016-01-16 DIAGNOSIS — T462X5A Adverse effect of other antidysrhythmic drugs, initial encounter: Secondary | ICD-10-CM | POA: Diagnosis not present

## 2016-01-16 DIAGNOSIS — I255 Ischemic cardiomyopathy: Secondary | ICD-10-CM

## 2016-01-16 DIAGNOSIS — J9601 Acute respiratory failure with hypoxia: Secondary | ICD-10-CM | POA: Diagnosis not present

## 2016-01-16 NOTE — Progress Notes (Signed)
[name]    [id number]    Oct 29, 1945  Primary Care Physician:Graham Damita Dunnings, MD  Referring Physician: Tonia Ghent, MD [address] [place], [place] [address]  Chief complaint:  Follow up for amiodarone toxicity.  HPI: 70 y.o. male with extensive cardiac history and several recent admissions for HCAP which he failed several out patient antibiotic treatments. Admitted on 11/28/15 with SOB, hypoxia and worsening diffuse bilateral infiltrates. He had immunologic workup which showed borderline CCP and rheumatoid factor. He was diagnosed with amiodarone toxicity and started on a slow prednisone taper. He has been discharged on supplemental oxygen.  In office today he continues on the prednisone taper. He is currently at 40 mg a day. We'll continue the taper by 10 mg every 2 weeks. He is in the process of weaning himself off the oxygen targeting O2 sat greater than 90%. His respiratory symptoms continues to improve. He's gained some weight and feels more energy  Outpatient Encounter Prescriptions as of 01/16/2016  Medication Sig  . acetaminophen (TYLENOL) 325 MG tablet Take 2 tablets (650 mg total) by mouth every 6 (six) hours as needed for mild pain (or Fever >/= 101).  [name] ALPRAZolam (XANAX) 0.25 MG tablet Take 1 tablet (0.25 mg total) by mouth 3 (three) times daily as needed for anxiety.  [name] apixaban (ELIQUIS) 5 MG TABS tablet Take 1 tablet (5 mg total) by mouth 2 (two) times daily. Resume 09/29/15  . aspirin 81 MG EC tablet Take 1 tablet (81 mg total) by mouth daily.  . benzonatate (TESSALON) 200 MG capsule Take 1 capsule (200 mg total) by mouth every 4 (four) hours as needed for cough.  . carvedilol (COREG) 3.125 MG tablet Take 1 tablet (3.125 mg total) by mouth 2 (two) times daily with a meal.  . donepezil (ARICEPT) 10 MG tablet Take 1 tablet (10 mg total) by mouth at bedtime.  . fexofenadine-pseudoephedrine (ALLEGRA-D 24) 180-240 MG 24 hr tablet Take 1 tablet by  mouth daily.  . furosemide (LASIX) 20 MG tablet Take 1 tablet (20 mg total) by mouth every Monday, Wednesday, and Friday.  [name] guaiFENesin (MUCINEX) 600 MG 12 hr tablet Take 600 mg by mouth daily.   [name] HYDROcodone-acetaminophen (NORCO/VICODIN) 5-325 MG tablet Take 1 tablet by mouth every 6 (six) hours as needed for moderate pain.  [name] loperamide (IMODIUM) 2 MG capsule Take 1 capsule (2 mg total) by mouth as needed for diarrhea or loose stools.  [name] losartan (COZAAR) 25 MG tablet Take 0.5 tablets (12.5 mg total) by mouth daily.  . metaxalone (SKELAXIN) 800 MG tablet TAKE 1 TABLET BY MOUTH 3 TIMES A DAY AS NEEDED FOR PAIN. (Patient taking differently: Take 800 mg by mouth 3 (three) times daily as needed for muscle spasms. )  . mexiletine (MEXITIL) 200 MG capsule Take 1 capsule (200 mg total) by mouth every 8 (eight) hours.  . Multiple Vitamins-Minerals (MULTIVITAMIN ADULTS 50+) TABS Take 1 tablet by mouth daily.  . nitroGLYCERIN (NITROSTAT) 0.4 MG SL tablet PLACE 1 TABLET (0.4 MG TOTAL) UNDER THE TONGUE EVERY 5 (FIVE) MINUTES AS NEEDED. FOR CHEST PAIN.  [name] predniSONE (DELTASONE) 20 MG tablet Take 2.5 tablets (50 mg total) by mouth daily with breakfast.  . promethazine (PHENERGAN) 25 MG tablet Take 25 mg by mouth every 6 (six) hours as needed for nausea or vomiting.  [name] RANEXA 500 MG 12 hr tablet TAKE 1 TABLET (500 MG TOTAL) BY MOUTH 2 (TWO)  TIMES DAILY.  . ranitidine (ZANTAC) 150 MG capsule Take 150 mg by mouth daily as needed for heartburn.   . zolpidem (AMBIEN) 5 MG tablet Take 1 tablet (5 mg total) by mouth at bedtime as needed for sleep.  . [DISCONTINUED] feeding supplement (BOOST / RESOURCE BREEZE) LIQD Take 1 Container by mouth 3 (three) times daily between meals.  . [DISCONTINUED] levalbuterol (XOPENEX) 0.63 MG/3ML nebulizer solution Take 3 mLs (0.63 mg total) by nebulization every 6 (six) hours as needed for wheezing or shortness of breath. (Patient not taking: Reported on 01/16/2016)  . [DISCONTINUED]  oxyCODONE-acetaminophen (ROXICET) 5-325 MG/5ML solution Take by mouth every 6 (six) hours as needed for severe pain.   Facility-Administered Encounter Medications as of 01/16/2016  Medication  . sodium chloride 0.9 % injection 3 mL  . sodium chloride 0.9 % injection 3 mL    Allergies as of 01/16/2016 - Review Complete 01/16/2016  Allergen Reaction Noted  . Ace inhibitors Other (See Comments) 05/02/2010  . Codeine Other (See Comments)   . Doxycycline Diarrhea and Nausea And Vomiting 11/17/2015  . Penicillins Swelling   . Lisinopril Other (See Comments) 09/22/2014  . Statins Other (See Comments) 08/17/2013    Past Medical History:  Diagnosis Date  . Acute lower GI bleeding 12/11/2011   "first time" (12/11/2011)  . Acute myocardial infarction, unspecified site, episode of care unspecified 1995   Pt living in [place], no stent, ?PTCA  . AICD (automatic cardioverter/defibrillator) present   . Allergic rhinitis, cause unspecified   . Anxiety   . Arthritis    "all over" (11/07/2015)  . Atrial fibrillation ([place])    a. 05/2015 - converted to sinus in setting of ICD shocks; placed on eliquis 5 bid.  [name] CAD (coronary artery disease), autologous vein bypass graft   . Cervical herniated disc    told not to lift >10 lbs  . Chronic systolic CHF (congestive heart failure), NYHA class 2 (HCC)    Reports EF of 25%.   [name] COPD (chronic obstructive pulmonary disease) ([place]) 11/2012   by xray  . Diverticulosis    by CT scan  . HCAP (healthcare-associated pneumonia) 11/06/2015  . Hypertension   . Insomnia   . Paroxysmal ventricular tachycardia ([place])   . Perennial allergic rhinitis    only to dust mites  . Pneumonia 2000s   "walking pneumonia"  . VT (ventricular tachycardia) ([place])    a. 05/2015 - VT storm with multiple ICD shocks-->Amio 400 BID.    Past Surgical History:  Procedure Laterality Date  . CARDIAC CATHETERIZATION  2004   LAD 30%, D1 30%, CFX-AV groove 70-80%, OM1 30%, EF 20-25%  .  CARDIAC CATHETERIZATION N/A 09/28/2015   Procedure: Left Heart Cath and Coronary Angiography;  Surgeon: Peter M Martinique, MD;  Location: [place] CV [facility];  Service: Cardiovascular;  Laterality: N/A;  . CARDIAC DEFIBRILLATOR PLACEMENT  2004  . CATARACT EXTRACTION W/ INTRAOCULAR LENS IMPLANT Right 01/2012  . COLONOSCOPY  01/08/2012   Procedure: COLONOSCOPY;  Surgeon: Gatha Mayer, MD;  [facility]: [facility];  Service: Endoscopy;  Laterality: N/A;  . CORONARY ANGIOPLASTY  1995   Pt thinks he got a balloon, living in [place], [place] N/A 02/07/2012   Procedure: IMPLANTABLE CARDIOVERTER DEFIBRILLATOR GENERATOR CHANGE;  Surgeon: Evans Lance, MD; Medtronic Evera XT VR single-chamber serial number [id number], Laterality: Left  . INSERT / REPLACE / REMOVE PACEMAKER  2004   Medtronic ICD  . KNEE ARTHROSCOPY  Left 05/2003   Archie Endo 06/19/2010  . LAPAROSCOPIC CHOLECYSTECTOMY  1/ 2012  . SHOULDER ARTHROSCOPY W/ ROTATOR CUFF REPAIR Right twice  . TONSILLECTOMY AND ADENOIDECTOMY  ~ 1951    Family History  Problem Relation Age of Onset  . Diabetes Father   . Tracheal cancer Father 67    smoker  . Stroke Mother   . Cancer Sister     left eye  . CAD Neg Hx   . Colon cancer Neg Hx   . Prostate cancer Neg Hx     Social History   Social History  . Marital status: Married    Spouse name: N/A  . Number of children: 0  . Years of education: N/A   Occupational History  . [employer] truck driver (retired)    Social History Main Topics  . Smoking status: Former Smoker    Packs/day: 0.50    Years: 50.00    Types: Cigarettes, Cigars    Quit date: 09/27/2015  . Smokeless tobacco: Never Used  . Alcohol use No  . Drug use: No  . Sexual activity: Not Currently   Other Topics Concern  . Not on file   Social History Narrative   Lives with wife, married 1998   Grown children, 2 great grandchildren   Occupation: retired, was Probation officer    Activity: walking, fishing   Diet: good water daily, fruits/vegetables rare      Wife is Economist.    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 No known agent orange exposure.     Review of systems: Review of Systems  Constitutional: Negative for fever and chills.  HENT: Negative.   Eyes: Negative for blurred vision.  Respiratory: as per HPI  Cardiovascular: Negative for chest pain and palpitations.  Gastrointestinal: Negative for vomiting, diarrhea, blood per rectum. Genitourinary: Negative for dysuria, urgency, frequency and hematuria.  Musculoskeletal: Negative for myalgias, back pain and joint pain.  Skin: Negative for itching and rash.  Neurological: Negative for dizziness, tremors, focal weakness, seizures and loss of consciousness.  Endo/Heme/Allergies: Negative for environmental allergies.  Psychiatric/Behavioral: Negative for depression, suicidal ideas and hallucinations.  All other systems reviewed and are negative.  Physical Exam: Blood pressure 120/72, pulse (!) 57, height '5\' 3"'$  (1.6 m), weight 140 lb (63.5 kg), SpO2 95 %. Gen:      No acute distress HEENT:  EOMI, sclera anicteric Neck:     No masses; no thyromegaly Lungs:    Clear to auscultation bilaterally; normal respiratory effort CV:         Regular rate and rhythm; no murmurs Abd:      + bowel sounds; soft, non-tender; no palpable masses, no distension Ext:    No edema; adequate peripheral perfusion Skin:      Warm and dry; no rash Neuro: alert and oriented x 3 Psych: normal mood and affect  Data Reviewed: Imaging LHC 09/28/15 >> Prox RCA lesion 100%, Mid cx 70%. EF 25% estimated. Continue medical therapy, no PCI. CT Chest 11/17/15 >> Multi focal infiltrate involving the lower lobes, left greater than right, and the left upper lobe is most likely an infectious or inflammatory process. Recommend short-term follow-up to ensure resolution. 4 mm nodule in the right upper lobe. Recommend attention on short-term follow-up. TTE 11/30/15  >> EF 25-30% w/ akinesis of inferolateral & inferior myocardium. Grade 2 diastolic dysfunction. RV normal in size and function. CXR 12/19/15 >> small improvement in bilateral opacities. All images personally reviewed.  CP Serologies: CRP: 10.9 ESR: 40  PR-3: <3.5 DS DNA Ab: <1 MPO: <9 RF: 12.1 C3: 78 C4: 9 ANA: Negative ANCA:  Negative Anti-CCP:  19  Assessment:  70 Y/O with hypoxic resp failure, b/l lung infiltrates secondary to amiodarone toxicity. He is on a slow steroid taper. We will continue to reduce prednisone by 10 mg every 2 weeks. Autoimmune work up noted with borderline CCP and RF. We will repeat them when he is off the prednisone completely Consider rheum consult if they turn positive or if he develops any arthritis symptoms. PFTs after he is off the prednisone.  Plan/Recommendations: - Continue prednisone taper - Follow up in clinic in 1 month with repeat CXR.                                                   Marshell Garfinkel MD [facility] Pager [phone] 01/16/2016, 2:21 PM  CC: Tonia Ghent, MD

## 2016-01-16 NOTE — Patient Instructions (Signed)
Continue reducing her prednisone by 10 mg every 2 weeks We will see you in the office in 1-2 months before you get off the prednisone completely to reevaluate.

## 2016-01-17 ENCOUNTER — Telehealth: Payer: Self-pay | Admitting: Pulmonary Disease

## 2016-01-17 NOTE — Telephone Encounter (Signed)
Spoke with [place] at [facility], states that the Mexiletine 200 MG CAP is not a medication they sale as they lose moey over ordering this medication bc it is so expensive. They do not have any record of contacting our office and the last rx that was sent to them for this was over a month ago. Geraldine Contras states that no one from the pharmacy has tried to contact our office, not sure where this information came from.   LM for patient x 1

## 2016-01-18 DIAGNOSIS — J9621 Acute and chronic respiratory failure with hypoxia: Secondary | ICD-10-CM | POA: Diagnosis not present

## 2016-01-18 DIAGNOSIS — I5023 Acute on chronic systolic (congestive) heart failure: Secondary | ICD-10-CM | POA: Diagnosis not present

## 2016-01-18 DIAGNOSIS — I110 Hypertensive heart disease with heart failure: Secondary | ICD-10-CM | POA: Diagnosis not present

## 2016-01-18 DIAGNOSIS — J449 Chronic obstructive pulmonary disease, unspecified: Secondary | ICD-10-CM | POA: Diagnosis not present

## 2016-01-18 DIAGNOSIS — I482 Chronic atrial fibrillation: Secondary | ICD-10-CM | POA: Diagnosis not present

## 2016-01-18 DIAGNOSIS — T462X5D Adverse effect of other antidysrhythmic drugs, subsequent encounter: Secondary | ICD-10-CM | POA: Diagnosis not present

## 2016-01-18 NOTE — Telephone Encounter (Signed)
Yes. Please order with no refill till he can see his cardiologist.

## 2016-01-18 NOTE — Telephone Encounter (Signed)
Spoke with pt. Advised him that I do not see where this office has ever filled this medication., He states that PM told him that he would have this taken care of when he was here on 01/16/16.  PM - please advise if you are okay with refilling this. Thanks.

## 2016-01-18 NOTE — Telephone Encounter (Signed)
Patient called back the Mexiletine 200 mg Cap are to be sent to [facility]. Out of the medication, have to take 3 pills a day, need to be filled today, first dose is at 7am... Contact # [phone].Marland KitchenCharm Rings

## 2016-01-18 NOTE — Telephone Encounter (Signed)
Spoke with patient to verify pharmacy, pt states that he no longer needs our office to send a prescription at this time as he had [facility]. Pt states that he spoke with [facility] pharmacy and they had a prescription that they were holding on his chart, they failed to call him to let him know that he would need to get this from another pharmacy because they were no longer ordering this medication. [facility] [place] [facility] [address] [facility] this. Pt aware that he can contact our office for refills if needed before he sees Cards - once he sees [facility] they will need to take over filling this. Pt expressed understanding. Nothing further needed.

## 2016-01-19 ENCOUNTER — Encounter: Payer: Medicare Other | Attending: Physical Medicine & Rehabilitation | Admitting: Physical Medicine & Rehabilitation

## 2016-01-19 ENCOUNTER — Other Ambulatory Visit: Payer: Self-pay

## 2016-01-19 ENCOUNTER — Telehealth: Payer: Self-pay

## 2016-01-19 ENCOUNTER — Encounter: Payer: Self-pay | Admitting: Physical Medicine & Rehabilitation

## 2016-01-19 VITALS — BP 111/73 | HR 85

## 2016-01-19 DIAGNOSIS — Z8719 Personal history of other diseases of the digestive system: Secondary | ICD-10-CM | POA: Insufficient documentation

## 2016-01-19 DIAGNOSIS — I5022 Chronic systolic (congestive) heart failure: Secondary | ICD-10-CM | POA: Diagnosis not present

## 2016-01-19 DIAGNOSIS — R5381 Other malaise: Secondary | ICD-10-CM | POA: Diagnosis not present

## 2016-01-19 DIAGNOSIS — Z833 Family history of diabetes mellitus: Secondary | ICD-10-CM | POA: Insufficient documentation

## 2016-01-19 DIAGNOSIS — Z808 Family history of malignant neoplasm of other organs or systems: Secondary | ICD-10-CM | POA: Insufficient documentation

## 2016-01-19 DIAGNOSIS — Z8679 Personal history of other diseases of the circulatory system: Secondary | ICD-10-CM

## 2016-01-19 DIAGNOSIS — R269 Unspecified abnormalities of gait and mobility: Secondary | ICD-10-CM | POA: Diagnosis not present

## 2016-01-19 DIAGNOSIS — Z801 Family history of malignant neoplasm of trachea, bronchus and lung: Secondary | ICD-10-CM | POA: Insufficient documentation

## 2016-01-19 DIAGNOSIS — J440 Chronic obstructive pulmonary disease with acute lower respiratory infection: Secondary | ICD-10-CM | POA: Diagnosis not present

## 2016-01-19 DIAGNOSIS — D649 Anemia, unspecified: Secondary | ICD-10-CM | POA: Diagnosis not present

## 2016-01-19 DIAGNOSIS — Z95810 Presence of automatic (implantable) cardiac defibrillator: Secondary | ICD-10-CM | POA: Diagnosis not present

## 2016-01-19 DIAGNOSIS — T462X1S Poisoning by other antidysrhythmic drugs, accidental (unintentional), sequela: Secondary | ICD-10-CM

## 2016-01-19 DIAGNOSIS — D72820 Lymphocytosis (symptomatic): Secondary | ICD-10-CM | POA: Diagnosis not present

## 2016-01-19 DIAGNOSIS — Z9981 Dependence on supplemental oxygen: Secondary | ICD-10-CM | POA: Diagnosis not present

## 2016-01-19 DIAGNOSIS — I110 Hypertensive heart disease with heart failure: Secondary | ICD-10-CM | POA: Diagnosis not present

## 2016-01-19 DIAGNOSIS — R251 Tremor, unspecified: Secondary | ICD-10-CM

## 2016-01-19 DIAGNOSIS — D638 Anemia in other chronic diseases classified elsewhere: Secondary | ICD-10-CM

## 2016-01-19 DIAGNOSIS — F419 Anxiety disorder, unspecified: Secondary | ICD-10-CM | POA: Diagnosis not present

## 2016-01-19 DIAGNOSIS — J432 Centrilobular emphysema: Secondary | ICD-10-CM

## 2016-01-19 DIAGNOSIS — I472 Ventricular tachycardia: Secondary | ICD-10-CM | POA: Insufficient documentation

## 2016-01-19 DIAGNOSIS — I2510 Atherosclerotic heart disease of native coronary artery without angina pectoris: Secondary | ICD-10-CM | POA: Diagnosis not present

## 2016-01-19 DIAGNOSIS — Z87891 Personal history of nicotine dependence: Secondary | ICD-10-CM | POA: Insufficient documentation

## 2016-01-19 DIAGNOSIS — I4891 Unspecified atrial fibrillation: Secondary | ICD-10-CM | POA: Insufficient documentation

## 2016-01-19 DIAGNOSIS — I252 Old myocardial infarction: Secondary | ICD-10-CM | POA: Diagnosis not present

## 2016-01-19 DIAGNOSIS — M1990 Unspecified osteoarthritis, unspecified site: Secondary | ICD-10-CM | POA: Diagnosis not present

## 2016-01-19 DIAGNOSIS — Z9841 Cataract extraction status, right eye: Secondary | ICD-10-CM | POA: Insufficient documentation

## 2016-01-19 DIAGNOSIS — N179 Acute kidney failure, unspecified: Secondary | ICD-10-CM | POA: Insufficient documentation

## 2016-01-19 DIAGNOSIS — Z955 Presence of coronary angioplasty implant and graft: Secondary | ICD-10-CM | POA: Diagnosis not present

## 2016-01-19 DIAGNOSIS — Z823 Family history of stroke: Secondary | ICD-10-CM | POA: Insufficient documentation

## 2016-01-19 DIAGNOSIS — I255 Ischemic cardiomyopathy: Secondary | ICD-10-CM

## 2016-01-19 DIAGNOSIS — Z9049 Acquired absence of other specified parts of digestive tract: Secondary | ICD-10-CM | POA: Insufficient documentation

## 2016-01-19 DIAGNOSIS — R195 Other fecal abnormalities: Secondary | ICD-10-CM | POA: Insufficient documentation

## 2016-01-19 DIAGNOSIS — Z950 Presence of cardiac pacemaker: Secondary | ICD-10-CM | POA: Diagnosis not present

## 2016-01-19 NOTE — Telephone Encounter (Signed)
Left verbal orders on VM. 

## 2016-01-19 NOTE — Progress Notes (Addendum)
Subjective:    Patient ID: [name], male    DOB: 02-Jul-1945, 70 y.o.   MRN: [id number]  HPI 70 y.o. male with history of CAD, A Fib, ICM s/p AICD, COPD, anxiety disorder, GIB, multiple hospitalizations for HCAP presents for follow up for debility secondary to multiple medical issues.    Last clinic visit 12/20/15.  History provided by pt, but mainly wife. Since last visit, he saw Cardiology.  He saw Pulm and his steroids were decreased to 30mg .  He ambulation is improving and he climbed his stairs at home yesterday.  His PCP is going to perform autoimmune workup in the future.  He is getting HHOT 2/week. His PCP is following labs.  His BMs have improved.  Overall, pt and wife state that he is doing much better.  His O2 requirement has decreased to 2.5L.  Pain Inventory Average Pain 0 Pain Right Now 0 My pain is NA  In the last 24 hours, has pain interfered with the following? General activity 4 Relation with others 4 Enjoyment of life 4 What TIME of day is your pain at its worst? NA Sleep (in general) Good  Pain is worse with: NA Pain improves with: medication and NA Relief from Meds: NA  Mobility use a walker  Function retired  Neuro/Psych weakness  Prior Studies Any changes since last visit?  no  Physicians involved in your care Any changes since last visit?  no   Family History  Problem Relation Age of Onset  . Diabetes Father   . Tracheal cancer Father [id number]    smoker  . Stroke Mother   . Cancer Sister     left eye  . CAD Neg Hx   . Colon cancer Neg Hx   . Prostate cancer Neg Hx    Social History   Social History  . Marital status: Married    Spouse name: N/A  . Number of children: 0  . Years of education: N/A   Occupational History  . [employer] truck driver (retired)    Social History Main Topics  . Smoking status: Former Smoker    Packs/day: 0.50    Years: 50.00    Types: Cigarettes, Cigars    Quit date: 09/27/2015  . Smokeless tobacco:  Never Used  . Alcohol use No  . Drug use: No  . Sexual activity: Not Currently   Other Topics Concern  . 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    [id number], [employer] resources [employer]. No known agent orange exposure.     Past Surgical History:  Procedure Laterality Date  . CARDIAC CATHETERIZATION  2004   LAD 30%, D1 30%, CFX-AV groove 70-80%, OM1 30%, EF 20-25%  . CARDIAC CATHETERIZATION N/A 09/28/2015   Procedure: Left Heart Cath and Coronary Angiography;  Surgeon: Peter M SwazilandJordan, MD;  [facility];  Service: Cardiovascular;  Laterality: N/A;  . CARDIAC DEFIBRILLATOR PLACEMENT  2004  . CATARACT EXTRACTION W/ INTRAOCULAR LENS IMPLANT Right 01/2012  . COLONOSCOPY  01/08/2012   Procedure: COLONOSCOPY;  Surgeon: Iva Booparl E Gessner, MD;  [facility];  Service: Endoscopy;  Laterality: N/A;  . CORONARY ANGIOPLASTY  1995   Pt thinks [place] got a balloon[place]  .  IMPLANTABLE CARDIOVERTER DEFIBRILLATOR GENERATOR CHANGE N/A 02/07/2012   Procedure: IMPLANTABLE CARDIOVERTER DEFIBRILLATOR GENERATOR CHANGE;  Surgeon: Marinus Maw, MD; Medtronic Evera XT VR single-chamber serial number [id number], Laterality: Left  . INSERT / REPLACE / REMOVE PACEMAKER  2004   Medtronic ICD  . KNEE ARTHROSCOPY Left 05/2003   Hattie Perch 06/19/2010  . LAPAROSCOPIC CHOLECYSTECTOMY  1/ 2012  . SHOULDER ARTHROSCOPY W/ ROTATOR CUFF REPAIR Right twice  . TONSILLECTOMY AND ADENOIDECTOMY  ~ 0929   Past Medical History:  Diagnosis Date  . Acute lower GI bleeding 12/11/2011   "first time" (12/11/2011)  . Acute myocardial infarction, unspecified site, episode of care unspecified 1995   Pt living in [place], no stent, ?PTCA  . AICD (automatic cardioverter/defibrillator) present   . Allergic  rhinitis, cause unspecified   . Anxiety   . Arthritis    "all over" (11/07/2015)  . Atrial fibrillation (HCC)    a. 05/2015 - converted to sinus in setting of ICD shocks; placed on eliquis 5 bid.  [name] CAD (coronary artery disease), autologous vein bypass graft   . Cervical herniated disc    told not to lift >10 lbs  . Chronic systolic CHF (congestive heart failure), NYHA class 2 (HCC)    Reports EF of 25%.   [name] COPD (chronic obstructive pulmonary disease) (HCC) 11/2012   by xray  . Diverticulosis    by CT scan  . HCAP (healthcare-associated pneumonia) 11/06/2015  . Hypertension   . Insomnia   . Paroxysmal ventricular tachycardia (HCC)   . Perennial allergic rhinitis    only to dust mites  . Pneumonia 2000s   "walking pneumonia"  . VT (ventricular tachycardia) (HCC)    a. 05/2015 - VT storm with multiple ICD shocks-->Amio 400 BID.   BP 111/73   Pulse 85   SpO2 90% Comment: on O2  Opioid Risk Score:   Fall Risk Score:  `1  Depression screen PHQ 2/9  Depression screen [facility] 2/9 12/20/2015 11/17/2015 09/15/2014 08/17/2013 06/25/2012  Decreased Interest 0 0 0 0 0  Down, Depressed, Hopeless 0 0 0 0 0  PHQ - 2 Score 0 0 0 0 0  Altered sleeping 0 - - - -  Tired, decreased energy 0 - - - -  Change in appetite 0 - - - -  Feeling bad or failure about yourself  0 - - - -  Trouble concentrating 0 - - - -  Moving slowly or fidgety/restless 0 - - - -  Suicidal thoughts 0 - - - -  PHQ-9 Score 0 - - - -  Difficult doing work/chores Not difficult at all - - - -  Some recent data might be hidden    Review of Systems  Constitutional: Positive for unexpected weight change.  HENT: Negative.   Eyes: Negative.   Respiratory: Positive for shortness of breath.   Cardiovascular: Negative.   Gastrointestinal: Negative.   Endocrine: Negative.   Genitourinary: Negative.   Musculoskeletal: Positive for gait problem.  Skin: Negative.   Neurological: Positive for weakness.  Hematological:  Negative.   Psychiatric/Behavioral: Negative.   All other systems reviewed and are negative.      Objective:   Physical Exam Constitutional: NAD. Vital signs reviewed.  HENT: Normocephalic and atraumatic.  Eyes: EOMI. No discharge.  Cardiovascular: RRR. No JVD. Respiratory: +[place]. DOE (improved). Lung sounds clear.  GI: Soft. Bowel sounds are normal.  Musculoskeletal: He exhibits no edema or tenderness.  Gait: Almost WNL with rolling walker. Neurological: He is  alert and oriented.  Motor: 4+/5 throughout (improving) Skin: Skin is warm and dry.  Not diaphoretic.    Assessment & Plan:  70 y.o. male with history of CAD, A Fib, ICM s/p AICD, COPD, anxiety disorder, GIB, multiple hospitalizations for HCAP presents for follow up for debility secondary to multiple medical issues.    1.  Weakness and poor activity tolerance secondary to debility from multiple medical issues  Cont to follow up with Pulm, Cards, PCP  Cont HHOT  Cont meds  Pt improving with mobility and endurance  2. Amiodarone toxicity/Centrilobular emphysema with respiratory failure:   Follow up with Pulm  Cont supplemental O2, wean as tolerated, on 2.5L at present.  Now has portable device.   Wean steroids per Pulm  Possibly end stage fibrosis  PCP to follow up for rule out of autoimmune disorder  3. VT/VF s/p AICD:   Cont meds  Cont recs per Cards  4. CAD with chronic systolic CHF with severe LVD:   Cont Cards follow up  Cont meds  5. Gait abnormality  Mainly due to fatigue and DOE  Is now using walker, cont for safety  Cont therapies  6. Tremors  Possibly secondary to steroids +/- anxiety (corroborated by wife and pt)  Will monitor with weaning of steroids

## 2016-01-19 NOTE — Telephone Encounter (Signed)
Received document from [facility] pharmacy for a refill of a cardiac medication "mexiletine"   Document was forwarded to his cardiologist by fax for medication to be refilled  Called cardiologist to verify the recet of the fax and spoke with Lupita Leash about fax.

## 2016-01-19 NOTE — Telephone Encounter (Signed)
Adovan OT with [facility] left v/m requesting verbal orders for home health OT 2 x a week for 3 weeks.

## 2016-01-19 NOTE — Telephone Encounter (Signed)
Please give the order.  Thanks.   

## 2016-01-22 DIAGNOSIS — I482 Chronic atrial fibrillation: Secondary | ICD-10-CM | POA: Diagnosis not present

## 2016-01-22 DIAGNOSIS — T462X5D Adverse effect of other antidysrhythmic drugs, subsequent encounter: Secondary | ICD-10-CM | POA: Diagnosis not present

## 2016-01-22 DIAGNOSIS — J9621 Acute and chronic respiratory failure with hypoxia: Secondary | ICD-10-CM | POA: Diagnosis not present

## 2016-01-22 DIAGNOSIS — I110 Hypertensive heart disease with heart failure: Secondary | ICD-10-CM | POA: Diagnosis not present

## 2016-01-22 DIAGNOSIS — I5023 Acute on chronic systolic (congestive) heart failure: Secondary | ICD-10-CM | POA: Diagnosis not present

## 2016-01-22 DIAGNOSIS — J449 Chronic obstructive pulmonary disease, unspecified: Secondary | ICD-10-CM | POA: Diagnosis not present

## 2016-01-25 DIAGNOSIS — J449 Chronic obstructive pulmonary disease, unspecified: Secondary | ICD-10-CM | POA: Diagnosis not present

## 2016-01-25 DIAGNOSIS — T462X5D Adverse effect of other antidysrhythmic drugs, subsequent encounter: Secondary | ICD-10-CM | POA: Diagnosis not present

## 2016-01-25 DIAGNOSIS — I5023 Acute on chronic systolic (congestive) heart failure: Secondary | ICD-10-CM | POA: Diagnosis not present

## 2016-01-25 DIAGNOSIS — I482 Chronic atrial fibrillation: Secondary | ICD-10-CM | POA: Diagnosis not present

## 2016-01-25 DIAGNOSIS — I110 Hypertensive heart disease with heart failure: Secondary | ICD-10-CM | POA: Diagnosis not present

## 2016-01-25 DIAGNOSIS — J9621 Acute and chronic respiratory failure with hypoxia: Secondary | ICD-10-CM | POA: Diagnosis not present

## 2016-01-30 ENCOUNTER — Ambulatory Visit (INDEPENDENT_AMBULATORY_CARE_PROVIDER_SITE_OTHER): Payer: Medicare Other | Admitting: *Deleted

## 2016-01-30 DIAGNOSIS — J449 Chronic obstructive pulmonary disease, unspecified: Secondary | ICD-10-CM | POA: Diagnosis not present

## 2016-01-30 DIAGNOSIS — I472 Ventricular tachycardia, unspecified: Secondary | ICD-10-CM

## 2016-01-30 DIAGNOSIS — I482 Chronic atrial fibrillation: Secondary | ICD-10-CM | POA: Diagnosis not present

## 2016-01-30 DIAGNOSIS — J9621 Acute and chronic respiratory failure with hypoxia: Secondary | ICD-10-CM | POA: Diagnosis not present

## 2016-01-30 DIAGNOSIS — I5023 Acute on chronic systolic (congestive) heart failure: Secondary | ICD-10-CM | POA: Diagnosis not present

## 2016-01-30 DIAGNOSIS — T462X5D Adverse effect of other antidysrhythmic drugs, subsequent encounter: Secondary | ICD-10-CM | POA: Diagnosis not present

## 2016-01-30 DIAGNOSIS — I110 Hypertensive heart disease with heart failure: Secondary | ICD-10-CM | POA: Diagnosis not present

## 2016-01-30 NOTE — Progress Notes (Signed)
Remote ICD transmission.   

## 2016-01-31 ENCOUNTER — Other Ambulatory Visit (INDEPENDENT_AMBULATORY_CARE_PROVIDER_SITE_OTHER): Payer: Medicare Other

## 2016-01-31 DIAGNOSIS — R7989 Other specified abnormal findings of blood chemistry: Secondary | ICD-10-CM

## 2016-01-31 DIAGNOSIS — R945 Abnormal results of liver function studies: Secondary | ICD-10-CM

## 2016-01-31 LAB — COMPREHENSIVE METABOLIC PANEL
ALBUMIN: 3.5 g/dL (ref 3.5–5.2)
ALT: 33 U/L (ref 0–53)
AST: 31 U/L (ref 0–37)
Alkaline Phosphatase: 56 U/L (ref 39–117)
BILIRUBIN TOTAL: 0.4 mg/dL (ref 0.2–1.2)
BUN: 28 mg/dL — ABNORMAL HIGH (ref 6–23)
CALCIUM: 8.8 mg/dL (ref 8.4–10.5)
CHLORIDE: 100 meq/L (ref 96–112)
CO2: 29 mEq/L (ref 19–32)
CREATININE: 1.08 mg/dL (ref 0.40–1.50)
GFR: 71.5 mL/min (ref >60.00)
Glucose, Bld: 234 mg/dL — ABNORMAL HIGH (ref 70–99)
Potassium: 5.5 mEq/L — ABNORMAL HIGH (ref 3.5–5.1)
Sodium: 140 mEq/L (ref 135–145)
Total Protein: 6.2 g/dL (ref 6.0–8.3)

## 2016-01-31 LAB — CBC WITH DIFFERENTIAL/PLATELET
BASOS PCT: 0 % (ref 0.0–3.0)
Basophils Absolute: 0 10*3/uL (ref 0.0–0.1)
EOS ABS: 0 10*3/uL (ref 0.0–0.7)
EOS PCT: 0.2 % (ref 0.0–5.0)
HEMATOCRIT: 34.9 % — AB (ref 39.0–52.0)
HEMOGLOBIN: 11.2 g/dL — AB (ref 13.0–17.0)
LYMPHS PCT: 12.6 % (ref 12.0–46.0)
Lymphs Abs: 1.4 10*3/uL (ref 0.7–4.0)
MCHC: 32.2 g/dL (ref 30.0–36.0)
MCV: 84.9 fl (ref 78.0–100.0)
MONOS PCT: 2.2 % — AB (ref 3.0–12.0)
Monocytes Absolute: 0.2 10*3/uL (ref 0.1–1.0)
Neutro Abs: 9.6 10*3/uL — ABNORMAL HIGH (ref 1.4–7.7)
Neutrophils Relative %: 85 % — ABNORMAL HIGH (ref 43.0–77.0)
Platelets: 365 10*3/uL (ref 150.0–400.0)
RBC: 4.11 Mil/uL — ABNORMAL LOW (ref 4.22–5.81)
RDW: 18.5 % — AB (ref 11.5–15.5)
WBC: 11.3 10*3/uL — AB (ref 4.0–10.5)

## 2016-01-31 LAB — CUP PACEART REMOTE DEVICE CHECK
Date Time Interrogation Session: 20171226151652
HighPow Impedance: 42 Ohm
HighPow Impedance: 51 Ohm
Implantable Lead Implant Date: 20040722
Implantable Lead Location: 753860
Implantable Lead Model: 6947
Implantable Pulse Generator Implant Date: 20140103
Lead Channel Impedance Value: 342 Ohm
Lead Channel Pacing Threshold Amplitude: 0.75 V
Lead Channel Pacing Threshold Pulse Width: 0.4 ms
Lead Channel Setting Pacing Pulse Width: 0.4 ms
Lead Channel Setting Sensing Sensitivity: 0.3 mV
MDC IDC MSMT BATTERY REMAINING LONGEVITY: 82 mo
MDC IDC MSMT BATTERY VOLTAGE: 3 V
MDC IDC MSMT LEADCHNL RV IMPEDANCE VALUE: 399 Ohm
MDC IDC MSMT LEADCHNL RV SENSING INTR AMPL: 4.25 mV
MDC IDC MSMT LEADCHNL RV SENSING INTR AMPL: 4.25 mV
MDC IDC SET LEADCHNL RV PACING AMPLITUDE: 2.5 V
MDC IDC STAT BRADY RV PERCENT PACED: 0 %

## 2016-02-01 ENCOUNTER — Other Ambulatory Visit: Payer: Self-pay | Admitting: Family Medicine

## 2016-02-01 DIAGNOSIS — I110 Hypertensive heart disease with heart failure: Secondary | ICD-10-CM | POA: Diagnosis not present

## 2016-02-01 DIAGNOSIS — J9621 Acute and chronic respiratory failure with hypoxia: Secondary | ICD-10-CM | POA: Diagnosis not present

## 2016-02-01 DIAGNOSIS — E875 Hyperkalemia: Secondary | ICD-10-CM

## 2016-02-01 DIAGNOSIS — J449 Chronic obstructive pulmonary disease, unspecified: Secondary | ICD-10-CM | POA: Diagnosis not present

## 2016-02-01 DIAGNOSIS — T462X5D Adverse effect of other antidysrhythmic drugs, subsequent encounter: Secondary | ICD-10-CM | POA: Diagnosis not present

## 2016-02-01 DIAGNOSIS — I482 Chronic atrial fibrillation: Secondary | ICD-10-CM | POA: Diagnosis not present

## 2016-02-01 DIAGNOSIS — I5023 Acute on chronic systolic (congestive) heart failure: Secondary | ICD-10-CM | POA: Diagnosis not present

## 2016-02-02 ENCOUNTER — Encounter: Payer: Self-pay | Admitting: Cardiology

## 2016-02-06 ENCOUNTER — Other Ambulatory Visit: Payer: Self-pay | Admitting: Family Medicine

## 2016-02-06 ENCOUNTER — Other Ambulatory Visit: Payer: Self-pay | Admitting: Nurse Practitioner

## 2016-02-06 ENCOUNTER — Telehealth: Payer: Self-pay

## 2016-02-06 DIAGNOSIS — I482 Chronic atrial fibrillation: Secondary | ICD-10-CM | POA: Diagnosis not present

## 2016-02-06 DIAGNOSIS — T462X5D Adverse effect of other antidysrhythmic drugs, subsequent encounter: Secondary | ICD-10-CM | POA: Diagnosis not present

## 2016-02-06 DIAGNOSIS — J9621 Acute and chronic respiratory failure with hypoxia: Secondary | ICD-10-CM | POA: Diagnosis not present

## 2016-02-06 DIAGNOSIS — I5023 Acute on chronic systolic (congestive) heart failure: Secondary | ICD-10-CM | POA: Diagnosis not present

## 2016-02-06 DIAGNOSIS — I110 Hypertensive heart disease with heart failure: Secondary | ICD-10-CM | POA: Diagnosis not present

## 2016-02-06 DIAGNOSIS — J449 Chronic obstructive pulmonary disease, unspecified: Secondary | ICD-10-CM | POA: Diagnosis not present

## 2016-02-06 NOTE — Telephone Encounter (Signed)
If this was a new medication for him (meaning, we were the ones who prescribed it in the first place), we can refill it for him until his next visit, but if he is on this medication before hospitalization he should follow up with his PCP.  Thanks.

## 2016-02-06 NOTE — Telephone Encounter (Signed)
[facility] to [facility] Alprazolam 0.25mg  tablet. Patient does have an upcoming appointment with Dr. Allena Katz schedule for 04/18/2016.

## 2016-02-07 ENCOUNTER — Other Ambulatory Visit: Payer: Self-pay | Admitting: Family Medicine

## 2016-02-07 NOTE — Telephone Encounter (Signed)
Last refill 12/15/15, last OV 12/25/15. Ok to refill?

## 2016-02-07 NOTE — Telephone Encounter (Signed)
Please call in.  Thanks.   

## 2016-02-07 NOTE — Telephone Encounter (Signed)
[name] was previously being prescribed diazepam by his PCP.  Last fill was 10/04/15.  Do we refill the alprazolam or tell him to contact his PCP?

## 2016-02-07 NOTE — Telephone Encounter (Signed)
Rx called to pharmacy as instructed. 

## 2016-02-08 ENCOUNTER — Telehealth: Payer: Self-pay

## 2016-02-08 DIAGNOSIS — I110 Hypertensive heart disease with heart failure: Secondary | ICD-10-CM | POA: Diagnosis not present

## 2016-02-08 DIAGNOSIS — J9621 Acute and chronic respiratory failure with hypoxia: Secondary | ICD-10-CM | POA: Diagnosis not present

## 2016-02-08 DIAGNOSIS — J449 Chronic obstructive pulmonary disease, unspecified: Secondary | ICD-10-CM | POA: Diagnosis not present

## 2016-02-08 DIAGNOSIS — I5023 Acute on chronic systolic (congestive) heart failure: Secondary | ICD-10-CM | POA: Diagnosis not present

## 2016-02-08 DIAGNOSIS — I482 Chronic atrial fibrillation: Secondary | ICD-10-CM | POA: Diagnosis not present

## 2016-02-08 DIAGNOSIS — T462X5D Adverse effect of other antidysrhythmic drugs, subsequent encounter: Secondary | ICD-10-CM | POA: Diagnosis not present

## 2016-02-08 NOTE — Telephone Encounter (Signed)
Adovan OT with [facility] left v/m; pt is being discharged from OT with [facility] due to pt meeting his goals; pt doing well with endurance and strength. Any questions can call Adovan.

## 2016-02-09 ENCOUNTER — Ambulatory Visit: Payer: Medicare Other | Admitting: Family Medicine

## 2016-02-09 ENCOUNTER — Telehealth: Payer: Self-pay | Admitting: Family Medicine

## 2016-02-09 ENCOUNTER — Telehealth: Payer: Self-pay | Admitting: Internal Medicine

## 2016-02-09 NOTE — Telephone Encounter (Signed)
Pt has 30 min appt with Dr Para March on 02/12/16 at 4pm.

## 2016-02-09 NOTE — Telephone Encounter (Signed)
Called, unable to reach pt. left voice message to call back to discuss medication issue.

## 2016-02-09 NOTE — Telephone Encounter (Signed)
New message       Pt c/o medication issue:  1. Name of Medication:  eliquis  2. How are you currently taking this medication (dosage and times per day)? 5mg  bid 3. Are you having a reaction (difficulty breathing--STAT)? no  4. What is your medication issue? Pt started taking eliquis twice a day during his hosp stay.  He states that since then, his skin is very sensitive and has lots of bruising.  The water from the shower, makes his skin "tingle" it is so sensitive.  Can he go back to taking 1 pill daily?

## 2016-02-09 NOTE — Telephone Encounter (Signed)
[facility] Day - Client TELEPHONE ADVICE RECORD [facility] Patient Name: [name] DOB: [birth year] Initial Comment Caller states, he is on a blood thinner. Having bruising. Shower water makes his skin sting. [name] 26-Feb-1945 [name]. Needs to speak to dr. [name] needs medication cut backVerified Nurse Assessment Nurse: Leveda Anna, RN, Aeriel Date/Time Lamount Cohen Time): 02/09/2016 8:29:29 AM Confirm and document reason for call. If symptomatic, describe symptoms. ---Caller states, he is on eloquis. He has been on it for long time. He did not have any problems when he first went on it. He had some hospital issues since then he bruises all over the place and he hasn't struck anything. When he takes a shower the water is stinging his skin. He thinks he needs to cut it back. The shower started really stinging his skin two days ago. Does the patient have any new or worsening symptoms? ---Yes Will a triage be completed? ---Yes Related visit to physician within the last 2 weeks? ---No Does the PT have any chronic conditions? (i.e. diabetes, asthma, etc.) ---Yes List chronic conditions. ---he is getting over the effects of toxicity of amioderone and lasix. Is this a behavioral health or substance abuse call? ---No Guidelines Guideline Title Affirmed Question Affirmed Notes Bruises [1] Bruise on head/face, chest, or abdomen AND [2] taking Coumadin (warfarin) or other strong blood thinner, or known bleeding disorder (e.g., thrombocytopenia) Final Disposition User Go to ED Now Hensel, RN, Aeriel Comments Nurse upgrade related to pt condition. Referrals [facility] - ED Disagree/Comply: Comply

## 2016-02-12 ENCOUNTER — Ambulatory Visit (INDEPENDENT_AMBULATORY_CARE_PROVIDER_SITE_OTHER): Payer: Medicare Other | Admitting: Family Medicine

## 2016-02-12 ENCOUNTER — Encounter: Payer: Self-pay | Admitting: Family Medicine

## 2016-02-12 VITALS — BP 102/68 | HR 88 | Temp 97.5°F | Wt 136.8 lb

## 2016-02-12 DIAGNOSIS — E875 Hyperkalemia: Secondary | ICD-10-CM

## 2016-02-12 DIAGNOSIS — R739 Hyperglycemia, unspecified: Secondary | ICD-10-CM

## 2016-02-12 DIAGNOSIS — I482 Chronic atrial fibrillation, unspecified: Secondary | ICD-10-CM

## 2016-02-12 DIAGNOSIS — F419 Anxiety disorder, unspecified: Secondary | ICD-10-CM | POA: Diagnosis not present

## 2016-02-12 DIAGNOSIS — M791 Myalgia, unspecified site: Secondary | ICD-10-CM

## 2016-02-12 MED ORDER — ALPRAZOLAM 0.25 MG PO TABS
ORAL_TABLET | ORAL | 1 refills | Status: DC
Start: 2016-02-12 — End: 2016-05-13

## 2016-02-12 MED ORDER — PREDNISONE 20 MG PO TABS: 10.0000 mg | ORAL_TABLET | Freq: Every day | ORAL | Status: DC

## 2016-02-12 MED ORDER — METAXALONE 800 MG PO TABS: 800.0000 mg | ORAL_TABLET | Freq: Every day | ORAL | 1 refills | Status: DC | PRN

## 2016-02-12 NOTE — Progress Notes (Signed)
Pre visit review using our clinic review tool, if applicable. No additional management support is needed unless otherwise documented below in the visit note. 

## 2016-02-12 NOTE — Patient Instructions (Addendum)
Go to the lab on the way out.  We'll contact you with your lab report. Take care.  Glad to see you.  Update me as needed.   Let me check with cardiology about the lidocaine and the tremor and the eliquis.   Take xanax if needed for anxiety and then if needed for sleep.  Try to limit use as much as possible.

## 2016-02-12 NOTE — Progress Notes (Signed)
He had a lot of bruising on eliquis 5mg  BID and he cut back to 5mg  daily.  He isn't bruising as much now.  Still on ASA 81mg  a day.  Down to 10mg  prednisone.  On less O2 now, down to 2L, down to 1.5L at home.  Not as SOB now and not having CP, tachycardia.  His functional status is clearly improved. His memory seems to be better.    He was asking about a possibly intolerance to lidocaine.   He has tremor he attributed to that. He has been having trouble manipulating pills, etc.  Xanax helps with the tremor when it happens, with transient relief.  B hand tremor, but usually not in the legs or feet.  He can occ have sensation of movement in the legs, wife questioned RLS sx.    Patient wants to know about getting dental work done with his current meds.  Can he have nitrous oxide treatment- needs mult teeth pulled.    Prev with high K, due for repeat labs.    Taking skelaxin daily if needed.  No ADE on med.   Taking Remus Loffler about every other day, prn for insomnia.   Taking xanax prn, about every other day, for anxiety Discussed merging the two to use xanax prn anxiety/tremor and for sleep.    PMH and SH reviewed  ROS: Per HPI unless specifically indicated in ROS section   Meds, vitals, and allergies reviewed.   GEN: nad, alert and oriented, on O2.   HEENT: mucous membranes moist NECK: supple w/o LA CV: IRR, not tachy PULM: ctab, no inc wob ABD: soft, +bs EXT: no edema SKIN: no acute rash

## 2016-02-13 DIAGNOSIS — E875 Hyperkalemia: Secondary | ICD-10-CM | POA: Insufficient documentation

## 2016-02-13 LAB — BASIC METABOLIC PANEL
BUN: 25 mg/dL — AB (ref 6–23)
CALCIUM: 9.2 mg/dL (ref 8.4–10.5)
CO2: 31 mEq/L (ref 19–32)
CREATININE: 1.05 mg/dL (ref 0.40–1.50)
Chloride: 102 mEq/L (ref 96–112)
GFR: 73.86 mL/min (ref >60.00)
GLUCOSE: 117 mg/dL — AB (ref 70–99)
POTASSIUM: 5.6 meq/L — AB (ref 3.5–5.1)
Sodium: 141 mEq/L (ref 135–145)

## 2016-02-13 LAB — HEMOGLOBIN A1C: Hgb A1c MFr Bld: 5.9 % (ref 4.6–6.5)

## 2016-02-13 NOTE — Assessment & Plan Note (Signed)
Merge ambien and xanax rx, to only use xanax prn.  Routine cautions given.  He agrees.

## 2016-02-13 NOTE — Assessment & Plan Note (Signed)
Continue prn skelaxin.  No ADE on med.

## 2016-02-13 NOTE — Assessment & Plan Note (Signed)
See notes on labs. 

## 2016-02-13 NOTE — Assessment & Plan Note (Addendum)
I'll check with cardiology about the eliquis dosing, pending dental work, the possible reaction to lidicaine.  No change in listed cardiac meds at this point.    >25 minutes spent in face to face time with patient, >50% spent in counselling or coordination of care.

## 2016-02-14 ENCOUNTER — Telehealth: Payer: Self-pay | Admitting: *Deleted

## 2016-02-14 NOTE — Telephone Encounter (Signed)
rec'd electronic [facility] [address], [place], [place] [address]

## 2016-02-15 ENCOUNTER — Telehealth: Payer: Self-pay | Admitting: Family Medicine

## 2016-02-15 ENCOUNTER — Other Ambulatory Visit: Payer: Self-pay | Admitting: Family Medicine

## 2016-02-15 DIAGNOSIS — E875 Hyperkalemia: Secondary | ICD-10-CM

## 2016-02-15 MED ORDER — SODIUM POLYSTYRENE SULFONATE 15 GM/60ML PO SUSP: 15.0000 g | Freq: Every day | ORAL | 0 refills | Status: DC

## 2016-02-15 NOTE — Telephone Encounter (Signed)
Pt requesting refill Remus Loffler again; pt understands Dr Para March wanted pt to stop Remus Loffler but pt has not slept for 2 days. Pt request refill today so can sleep tonight. The xanax 0.25mg   is not helping pt to sleep; pt last seen 02/12/16. [facility] [place]

## 2016-02-15 NOTE — Telephone Encounter (Signed)
Patient advised.

## 2016-02-15 NOTE — Telephone Encounter (Signed)
Please have him try a double dose of xanax as night, 0.5mg .  See if that helps.  If not when we'll work on the rx. Thanks.  I didn't send anything in yet.

## 2016-02-15 NOTE — Telephone Encounter (Signed)
Pt returned call re: lab results, read results to him and scheduled K recheck in 1 week.  Pt also wanted to get refill on medication, transferred to triage / lt

## 2016-02-19 ENCOUNTER — Telehealth: Payer: Self-pay | Admitting: Family Medicine

## 2016-02-19 ENCOUNTER — Other Ambulatory Visit: Payer: Self-pay | Admitting: *Deleted

## 2016-02-19 NOTE — Telephone Encounter (Signed)
Defer mexiletine to cards.   Low K diet in meantime and drink plenty of fluids.   Then recheck K in a few days.  Thanks.   Refill request sent to cards in the meantime.

## 2016-02-19 NOTE — Telephone Encounter (Signed)
Patient left a voicemail stating that he was prescribed Mexiletine at the hospital and needs to get a new script sent in by Dr. Para March. Patient stated that also was given Kayexalate and it caused him to have such bad indigestion and could not breath that he stopped taking it. Patient stated that he spoke to the pharmacist and was advised not to take the Kayexalate until he spoke to Dr. Para March about it. Patient requested a call back.  [facility]

## 2016-02-19 NOTE — Telephone Encounter (Signed)
Will leave Mexiletine decision to PCP but will likely need to f/u with cardiology for this.  As far as kayexalate - will also defer to PCP. Looks like he ended up not taking.  In interim recommend increased water intake.

## 2016-02-19 NOTE — Telephone Encounter (Signed)
Patient notified as instructed by telephone and verbalized understanding. Patient stated that he already has a follow-up lab appointment scheduled this week.

## 2016-02-20 ENCOUNTER — Telehealth: Payer: Self-pay

## 2016-02-20 ENCOUNTER — Other Ambulatory Visit: Payer: Self-pay | Admitting: Internal Medicine

## 2016-02-20 MED ORDER — APIXABAN 5 MG PO TABS: 5.0000 mg | ORAL_TABLET | Freq: Two times a day (BID) | ORAL | Status: DC

## 2016-02-20 NOTE — Telephone Encounter (Signed)
Pt called stating that he had received a shock from his defibrillator earlier this moring. Pt stated that he feels fine currently, denies CP or SOB. Pt stated that he had taken his medications and was feeling ok yesterday. Informed pt transmission would be reviewed and I would call pt back and inform him of Dr. Lubertha Basque recommendations. Informed pt of DMV driving restrictions pt voiced understanding. Pt voiced understanding.

## 2016-02-20 NOTE — Telephone Encounter (Signed)
LVM for pt to make an apt with Dr. [name] in the next week.

## 2016-02-20 NOTE — Telephone Encounter (Signed)
Notify pt.  Talked with cardiology. Per cards:   Would be likely better to stop ASA and going back to twice daily eliquis. Should not be a problem with nitrous oxide. He can stop his anti-coagulation 2 days before the procedure and restart when dentist says its ok. He had lidocaine toxicity (neurological) in the hospital but this resolved within a day of stopping the lidocaine. He shouldn't have ongoing issues from the lidocaine.    Thanks.

## 2016-02-20 NOTE — Telephone Encounter (Signed)
Patient and wife advised

## 2016-02-22 ENCOUNTER — Other Ambulatory Visit: Payer: Medicare Other

## 2016-02-22 ENCOUNTER — Telehealth: Payer: Self-pay | Admitting: Physician Assistant

## 2016-02-22 ENCOUNTER — Encounter: Payer: Medicare Other | Admitting: Internal Medicine

## 2016-02-22 MED ORDER — MEXILETINE HCL 200 MG PO CAPS: 200.0000 mg | ORAL_CAPSULE | Freq: Three times a day (TID) | ORAL | 1 refills | Status: DC

## 2016-02-22 NOTE — Telephone Encounter (Signed)
Pt called answering service on snow day. States he has placed several refill requests for Mexilitine but hasn't heard back yet. Suspect snow delay. He confirmed rx and dose. [facility] [place] [facility]. Pt grateful for call. Gaius Ishaq PA-C

## 2016-02-23 ENCOUNTER — Other Ambulatory Visit: Payer: Medicare Other

## 2016-02-26 ENCOUNTER — Other Ambulatory Visit: Payer: Self-pay | Admitting: Adult Health

## 2016-02-27 ENCOUNTER — Other Ambulatory Visit (INDEPENDENT_AMBULATORY_CARE_PROVIDER_SITE_OTHER): Payer: Medicare Other

## 2016-02-27 DIAGNOSIS — E875 Hyperkalemia: Secondary | ICD-10-CM | POA: Diagnosis not present

## 2016-02-27 LAB — POTASSIUM: Potassium: 5 mEq/L (ref 3.5–5.1)

## 2016-02-28 ENCOUNTER — Ambulatory Visit (INDEPENDENT_AMBULATORY_CARE_PROVIDER_SITE_OTHER): Payer: Medicare Other | Admitting: Internal Medicine

## 2016-02-28 ENCOUNTER — Encounter: Payer: Self-pay | Admitting: Internal Medicine

## 2016-02-28 VITALS — BP 110/76 | HR 77 | Ht 63.0 in | Wt 146.2 lb

## 2016-02-28 DIAGNOSIS — Z95810 Presence of automatic (implantable) cardiac defibrillator: Secondary | ICD-10-CM

## 2016-02-28 DIAGNOSIS — I472 Ventricular tachycardia, unspecified: Secondary | ICD-10-CM

## 2016-02-28 DIAGNOSIS — I4901 Ventricular fibrillation: Secondary | ICD-10-CM | POA: Diagnosis not present

## 2016-02-28 DIAGNOSIS — I5022 Chronic systolic (congestive) heart failure: Secondary | ICD-10-CM | POA: Diagnosis not present

## 2016-02-28 LAB — CUP PACEART INCLINIC DEVICE CHECK
Battery Remaining Longevity: 76 mo
Battery Voltage: 2.98 V
Brady Statistic RV Percent Paced: 0.01 %
Date Time Interrogation Session: 20180124095904
HighPow Impedance: 45 Ohm
HighPow Impedance: 52 Ohm
Implantable Lead Location: 753860
Implantable Lead Model: 6947
Implantable Pulse Generator Implant Date: 20140103
Lead Channel Impedance Value: 399 Ohm
Lead Channel Sensing Intrinsic Amplitude: 3.75 mV
Lead Channel Setting Pacing Amplitude: 2.5 V
Lead Channel Setting Pacing Pulse Width: 0.4 ms
MDC IDC LEAD IMPLANT DT: 20040722
MDC IDC MSMT LEADCHNL RV IMPEDANCE VALUE: 342 Ohm
MDC IDC MSMT LEADCHNL RV PACING THRESHOLD AMPLITUDE: 0.75 V
MDC IDC MSMT LEADCHNL RV PACING THRESHOLD PULSEWIDTH: 0.4 ms
MDC IDC SET LEADCHNL RV SENSING SENSITIVITY: 0.3 mV

## 2016-02-28 NOTE — Progress Notes (Signed)
HPI Mr. Forton returns today for followup. He is a 71 year old man with an ischemic cardiomyopathy, chronic systolic heart failure, class III, chronic atrial fibrillation, previously unwilling to take any anticoagulation, ventricular tachycardia, status post ICD implantation. He was in the hospital several months ago with VT storm. He was placed on Eliquis as he converted back to NSR after being out of rhythm for over a year. The patient was placed on amiodarone to prevent VT. He developed amio lung toxicity and the amio was stopped. He seems to be tolerating Eliquis very well. He denies chest pain. He has class 2-3 chf. He has had some hyperkalemia but this is better Since I saw him last, he feels better. He had 2 ICD shocks last week, appropriate for VT after ATP failed.   Allergies  Allergen Reactions  . Ace Inhibitors Other (See Comments)    muscle pain. Tolerates ARBs.   . Codeine Other (See Comments)    "head wants to explode."  . Doxycycline Diarrhea and Nausea And Vomiting  . Kionex [Sodium Polystyrene Sulfonate] Other (See Comments)    SOB, pressure in chest, weakness fatigue.   [name] Penicillins Swelling    "started at point of injection; w/in 3 min my upper arm was swollen 3 times normal" Has patient had a PCN reaction causing immediate rash, facial/tongue/throat swelling, SOB or lightheadedness with hypotension: Yes Has patient had a PCN reaction causing severe rash involving mucus membranes or skin necrosis: No Has patient had a PCN reaction that required hospitalization No Has patient had a PCN reaction occurring within the last 10 years: No If all of the above answers are "NO", then may proceed wi  . Lisinopril Other (See Comments)    Muscle Pain  . Statins Other (See Comments)    Myalgias per patient     Current Outpatient Prescriptions  Medication Sig Dispense Refill  . acetaminophen (TYLENOL) 325 MG tablet Take 2 tablets (650 mg total) by mouth every 6 (six) hours as needed  for mild pain (or Fever >/= 101).    [name] ALPRAZolam (XANAX) 0.25 MG tablet TAKE 1 TABLET BY MOUTH 3 TIMES DAILY AS NEEDED FOR ANXIETY OR SLEEP 30 tablet 1  . apixaban (ELIQUIS) 5 MG TABS tablet Take 1 tablet (5 mg total) by mouth 2 (two) times daily.    . benzonatate (TESSALON) 200 MG capsule Take 1 capsule (200 mg total) by mouth every 4 (four) hours as needed for cough. 20 capsule 0  . carvedilol (COREG) 3.125 MG tablet Take 1 tablet (3.125 mg total) by mouth 2 (two) times daily with a meal. 60 tablet 0  . Chlorpheniramine-DM (CORICIDIN HBP COUGH/COLD PO) Take 2 capsules by mouth as needed.    . donepezil (ARICEPT) 10 MG tablet TAKE 1 TABLET (10 MG TOTAL) BY MOUTH AT BEDTIME. 90 tablet 1  . fexofenadine-pseudoephedrine (ALLEGRA-D 24) 180-240 MG 24 hr tablet Take 1 tablet by mouth daily.    . furosemide (LASIX) 20 MG tablet TAKE 1 TABLET (20 MG TOTAL) BY MOUTH EVERY MONDAY, WEDNESDAY, AND FRIDAY. 30 tablet 0  . guaiFENesin (MUCINEX) 600 MG 12 hr tablet Take 600 mg by mouth 2 (two) times daily.     [name] HYDROcodone-acetaminophen (NORCO/VICODIN) 5-325 MG tablet Take 1 tablet by mouth every 6 (six) hours as needed for moderate pain.    [name] loperamide (IMODIUM) 2 MG capsule Take 1 capsule (2 mg total) by mouth as needed for diarrhea or loose stools. 30 capsule 0  . losartan (COZAAR) 25  MG tablet Take 0.5 tablets (12.5 mg total) by mouth daily. 30 tablet 0  . metaxalone (SKELAXIN) 800 MG tablet Take 1 tablet (800 mg total) by mouth daily as needed for muscle spasms. 30 tablet 1  . mexiletine (MEXITIL) 200 MG capsule Take 1 capsule (200 mg total) by mouth every 8 (eight) hours. 90 capsule 1  . nitroGLYCERIN (NITROSTAT) 0.4 MG SL tablet PLACE 1 TABLET (0.4 MG TOTAL) UNDER THE TONGUE EVERY 5 (FIVE) MINUTES AS NEEDED. FOR CHEST PAIN. 25 tablet 1  . predniSONE (DELTASONE) 20 MG tablet Take 0.5 tablets (10 mg total) by mouth daily with breakfast.    . promethazine (PHENERGAN) 25 MG tablet Take 25 mg by mouth every 6  (six) hours as needed for nausea or vomiting.    [name] RANEXA 500 MG 12 hr tablet TAKE 1 TABLET (500 MG TOTAL) BY MOUTH 2 (TWO) TIMES DAILY. 60 tablet 1  . ranitidine (ZANTAC) 150 MG capsule Take 150 mg by mouth daily as needed for heartburn.      No current facility-administered medications for this visit.    Facility-Administered Medications Ordered in Other Visits  Medication Dose Route Frequency Provider Last Rate Last Dose  . sodium chloride 0.9 % injection 3 mL  3 mL Intravenous Q12H Marinus Maw, MD      . sodium chloride 0.9 % injection 3 mL  3 mL Intravenous PRN Marinus Maw, MD         Past Medical History:  Diagnosis Date  . Acute lower GI bleeding 12/11/2011   "first time" (12/11/2011)  . Acute myocardial infarction, unspecified site, episode of care unspecified 1995   Pt living in [place], no stent, ?PTCA  . AICD (automatic cardioverter/defibrillator) present   . Allergic rhinitis, cause unspecified   . Anxiety   . Arthritis    "all over" (11/07/2015)  . Atrial fibrillation (HCC)    a. 05/2015 - converted to sinus in setting of ICD shocks; placed on eliquis 5 bid.  [name] CAD (coronary artery disease), autologous vein bypass graft   . Cervical herniated disc    told not to lift >10 lbs  . Chronic systolic CHF (congestive heart failure), NYHA class 2 (HCC)    Reports EF of 25%.   [name] COPD (chronic obstructive pulmonary disease) (HCC) 11/2012   by xray  . Diverticulosis    by CT scan  . HCAP (healthcare-associated pneumonia) 11/06/2015  . Hypertension   . Insomnia   . Paroxysmal ventricular tachycardia (HCC)   . Perennial allergic rhinitis    only to dust mites  . Pneumonia 2000s   "walking pneumonia"  . VT (ventricular tachycardia) (HCC)    a. 05/2015 - VT storm with multiple ICD shocks-->Amio 400 BID.    ROS:   All systems reviewed and negative except as noted in the HPI.   Past Surgical History:  Procedure Laterality Date  . CARDIAC CATHETERIZATION  2004   LAD  30%, D1 30%, CFX-AV groove 70-80%, OM1 30%, EF 20-25%  . CARDIAC CATHETERIZATION N/A 09/28/2015   Procedure: Left Heart Cath and Coronary Angiography;  Surgeon: Peter M Swaziland, MD;  Location: [facility] INVASIVE CV LAB;  Service: Cardiovascular;  Laterality: N/A;  . CARDIAC DEFIBRILLATOR PLACEMENT  2004  . CATARACT EXTRACTION W/ INTRAOCULAR LENS IMPLANT Right 01/2012  . COLONOSCOPY  01/08/2012   Procedure: COLONOSCOPY;  Surgeon: Iva Boop, MD;  Location: [facility] ENDOSCOPY;  Service: Endoscopy;  Laterality: N/A;  . CORONARY ANGIOPLASTY  1995  Pt thinks he got a balloon, living in [place], [place]  . IMPLANTABLE CARDIOVERTER DEFIBRILLATOR GENERATOR CHANGE N/A 02/07/2012   Procedure: IMPLANTABLE CARDIOVERTER DEFIBRILLATOR GENERATOR CHANGE;  Surgeon: Marinus Maw, MD; Medtronic Evera XT VR single-chamber serial number [id number], Laterality: Left  . INSERT / REPLACE / REMOVE PACEMAKER  2004   Medtronic ICD  . KNEE ARTHROSCOPY Left 05/2003   Hattie Perch 06/19/2010  . LAPAROSCOPIC CHOLECYSTECTOMY  1/ 2012  . SHOULDER ARTHROSCOPY W/ ROTATOR CUFF REPAIR Right twice  . TONSILLECTOMY AND ADENOIDECTOMY  ~ 1951     Family History  Problem Relation Age of Onset  . Diabetes Father   . Tracheal cancer Father 69    smoker  . Stroke Mother   . Cancer Sister     left eye  . CAD Neg Hx   . Colon cancer Neg Hx   . Prostate cancer Neg Hx      Social History   Social History  . Marital status: Married    Spouse name: N/A  . Number of children: 0  . Years of education: N/A   Occupational History  . [employer] truck driver (retired)    Social History Main Topics  . Smoking status: Former Smoker    Packs/day: 0.50    Years: 50.00    Types: Cigarettes, Cigars    Quit date: 09/27/2015  . Smokeless tobacco: Never Used  . Alcohol use No  . Drug use: No  . Sexual activity: Not Currently   Other Topics Concern  . Not on file   Social History Narrative   Lives with wife, married 1998   Grown children, 2 great  grandchildren   Occupation: retired, was Presenter, broadcasting   Activity: walking, fishing   Diet: good water daily, fruits/vegetables rare      Wife is Product manager.    [id number], Human resources officer. No known agent orange exposure.       BP 110/76   Pulse 77   Ht 5\' 3"  (1.6 m)   Wt 146 lb 3.2 oz (66.3 kg)   BMI 25.90 kg/m   Physical Exam:  Chronically ill appearing 71 yo man, NAD HEENT: Unremarkable Neck:  6 cm JVD, no thyromegally Lungs:  Clear except for rales in the bases. No wheezes or rhonchi. Well-healed ICD incision. HEART:  Regular brady rhythm, no murmurs, no rubs, no clicks Abd:  soft, positive bowel sounds, no organomegally, no rebound, no guarding Ext:  2 plus pulses, no edema, no cyanosis, no clubbing Skin:  No rashes no nodules Neuro:  CN II through XII intact, motor grossly intact   DEVICE  Normal device function.  See PaceArt for details. Optivol is stable.  Assess/Plan:  1. VT - he has stopped amio and is on mexitil 200 tid 2. Chronic systolic heart failure - his symptoms are controlled. No change in meds. He is improved. 3. ICD - his Medtronic device is working normally. Will recheck in several months.  4. Atrial fib - he is at risk for stroke. He is tolerating his Eliquis for thromboembolic prevention.  Leonia Reeves.D.

## 2016-02-28 NOTE — Patient Instructions (Addendum)
Medication Instructions:  Your physician recommends that you continue on your current medications as directed. Please refer to the Current Medication list given to you today.   Labwork: None Ordered   Testing/Procedures: None Ordered    Follow-Up: Your physician recommends that you schedule a follow-up appointment in: 3 months with Dr. Taylor   Any Other Special Instructions Will Be Listed Below (If Applicable).     If you need a refill on your cardiac medications before your next appointment, please call your pharmacy.   

## 2016-03-05 ENCOUNTER — Encounter: Payer: Self-pay | Admitting: Pulmonary Disease

## 2016-03-05 ENCOUNTER — Ambulatory Visit (INDEPENDENT_AMBULATORY_CARE_PROVIDER_SITE_OTHER)
Admission: RE | Admit: 2016-03-05 | Discharge: 2016-03-05 | Disposition: A | Discharge status: Home / Self Care | Payer: Medicare Other | Source: Ambulatory Visit | Attending: Pulmonary Disease | Admitting: Pulmonary Disease

## 2016-03-05 ENCOUNTER — Ambulatory Visit (INDEPENDENT_AMBULATORY_CARE_PROVIDER_SITE_OTHER): Payer: Medicare Other | Admitting: Pulmonary Disease

## 2016-03-05 VITALS — BP 100/60 | HR 77 | Ht 63.0 in | Wt 139.6 lb

## 2016-03-05 DIAGNOSIS — J9601 Acute respiratory failure with hypoxia: Secondary | ICD-10-CM | POA: Diagnosis not present

## 2016-03-05 DIAGNOSIS — J984 Other disorders of lung: Secondary | ICD-10-CM | POA: Diagnosis not present

## 2016-03-05 DIAGNOSIS — T462X5A Adverse effect of other antidysrhythmic drugs, initial encounter: Secondary | ICD-10-CM

## 2016-03-05 DIAGNOSIS — J449 Chronic obstructive pulmonary disease, unspecified: Secondary | ICD-10-CM | POA: Diagnosis not present

## 2016-03-05 NOTE — Patient Instructions (Signed)
We will send you down for an x-ray today Continue the prednisone at 10 mg and supplemental oxygen until you hear from me about x-ray Return to clinic in 3 months

## 2016-03-05 NOTE — [place] Notes (Addendum)
[name]    [id number]    May 05, 1945  Primary Care Physician:Graham Damita Dunnings, MD  Referring Physician: Tonia Ghent, MD [address] [place], [place] [id number]  Chief complaint:  Follow up for amiodarone toxicity.  HPI: 71 y.o. male with extensive cardiac history and several recent admissions for HCAP which he failed several out patient antibiotic treatments. Admitted on 11/28/15 with SOB, hypoxia and worsening diffuse bilateral infiltrates. He had immunologic workup which showed borderline CCP and rheumatoid factor. He was diagnosed with amiodarone toxicity and started on a slow prednisone taper. He has been discharged on supplemental oxygen.  Interim History: In office today he continues on the prednisone taper. He is currently at 10 mg a day and would like to get off it altogether. He continues on oxygen and has not been able to wean himself off completely. HE had a brief set back when he was placed on kionex for hyperkalemia with worsening in his breathing. However he is off it now and his respiratory status continues to improve and he has more energy and is able to exert himself more now.   Outpatient Encounter Prescriptions as of 03/05/2016  Medication Sig  . acetaminophen (TYLENOL) 325 MG tablet Take 2 tablets (650 mg total) by mouth every 6 (six) hours as needed for mild pain (or Fever >/= 101).  [name] ALPRAZolam (XANAX) 0.25 MG tablet TAKE 1 TABLET BY MOUTH 3 TIMES DAILY AS NEEDED FOR ANXIETY OR SLEEP  . apixaban (ELIQUIS) 5 MG TABS tablet Take 1 tablet (5 mg total) by mouth 2 (two) times daily.  . benzonatate (TESSALON) 200 MG capsule Take 1 capsule (200 mg total) by mouth every 4 (four) hours as needed for cough.  . carvedilol (COREG) 3.125 MG tablet Take 1 tablet (3.125 mg total) by mouth 2 (two) times daily with a meal.  . Chlorpheniramine-DM (CORICIDIN HBP COUGH/COLD PO) Take 2 capsules by mouth as needed.  . donepezil (ARICEPT) 10 MG tablet TAKE 1 TABLET  (10 MG TOTAL) BY MOUTH AT BEDTIME.  . fexofenadine-pseudoephedrine (ALLEGRA-D 24) 180-240 MG 24 hr tablet Take 1 tablet by mouth daily.  . furosemide (LASIX) 20 MG tablet TAKE 1 TABLET (20 MG TOTAL) BY MOUTH EVERY MONDAY, WEDNESDAY, AND FRIDAY.  [name] guaiFENesin (MUCINEX) 600 MG 12 hr tablet Take 600 mg by mouth 2 (two) times daily.   [name] HYDROcodone-acetaminophen (NORCO/VICODIN) 5-325 MG tablet Take 1 tablet by mouth every 6 (six) hours as needed for moderate pain.  [name] loperamide (IMODIUM) 2 MG capsule Take 1 capsule (2 mg total) by mouth as needed for diarrhea or loose stools.  [name] losartan (COZAAR) 25 MG tablet Take 0.5 tablets (12.5 mg total) by mouth daily.  . metaxalone (SKELAXIN) 800 MG tablet Take 1 tablet (800 mg total) by mouth daily as needed for muscle spasms.  [name] mexiletine (MEXITIL) 200 MG capsule Take 1 capsule (200 mg total) by mouth every 8 (eight) hours.  . nitroGLYCERIN (NITROSTAT) 0.4 MG SL tablet PLACE 1 TABLET (0.4 MG TOTAL) UNDER THE TONGUE EVERY 5 (FIVE) MINUTES AS NEEDED. FOR CHEST PAIN.  [name] predniSONE (DELTASONE) 20 MG tablet Take 0.5 tablets (10 mg total) by mouth daily with breakfast.  . promethazine (PHENERGAN) 25 MG tablet Take 25 mg by mouth every 6 (six) hours as needed for nausea or vomiting.  [name] RANEXA 500 MG 12 hr tablet TAKE 1 TABLET (500 MG TOTAL) BY MOUTH 2 (TWO) TIMES DAILY.  . ranitidine (  ZANTAC) 150 MG capsule Take 150 mg by mouth daily as needed for heartburn.    Facility-Administered Encounter Medications as of 03/05/2016  Medication  . sodium chloride 0.9 % injection 3 mL  . sodium chloride 0.9 % injection 3 mL    Allergies as of 03/05/2016 - Review Complete 03/05/2016  Allergen Reaction Noted  . Ace inhibitors Other (See Comments) 05/02/2010  . Codeine Other (See Comments)   . Doxycycline Diarrhea and Nausea And Vomiting 11/17/2015  . Kionex [sodium polystyrene sulfonate] Other (See Comments) 02/28/2016  . Penicillins Swelling   . Lisinopril Other (See  Comments) 09/22/2014  . Statins Other (See Comments) 08/17/2013    Past Medical History:  Diagnosis Date  . Acute lower GI bleeding 12/11/2011   "first time" (12/11/2011)  . Acute myocardial infarction, unspecified site, episode of care unspecified 1995   Pt living in [place], no stent, ?PTCA  . AICD (automatic cardioverter/defibrillator) present   . Allergic rhinitis, cause unspecified   . Anxiety   . Arthritis    "all over" (11/07/2015)  . Atrial fibrillation ([place])    a. 05/2015 - converted to sinus in setting of ICD shocks; placed on eliquis 5 bid.  [name] CAD (coronary artery disease), autologous vein bypass graft   . Cervical herniated disc    told not to lift >10 lbs  . Chronic systolic CHF (congestive heart failure), NYHA class 2 (HCC)    Reports EF of 25%.   [name] COPD (chronic obstructive pulmonary disease) ([place]) 11/2012   by xray  . Diverticulosis    by CT scan  . HCAP (healthcare-associated pneumonia) 11/06/2015  . Hypertension   . Insomnia   . Paroxysmal ventricular tachycardia ([place])   . Perennial allergic rhinitis    only to dust mites  . Pneumonia 2000s   "walking pneumonia"  . VT (ventricular tachycardia) ([place])    a. 05/2015 - VT storm with multiple ICD shocks-->Amio 400 BID.    Past Surgical History:  Procedure Laterality Date  . CARDIAC CATHETERIZATION  2004   LAD 30%, D1 30%, CFX-AV groove 70-80%, OM1 30%, EF 20-25%  . CARDIAC CATHETERIZATION N/A 09/28/2015   Procedure: Left Heart Cath and Coronary Angiography;  Surgeon: Peter M Martinique, MD;  Location: [facility];  Service: Cardiovascular;  Laterality: N/A;  . CARDIAC DEFIBRILLATOR PLACEMENT  2004  . CATARACT EXTRACTION W/ INTRAOCULAR LENS IMPLANT Right 01/2012  . COLONOSCOPY  01/08/2012   Procedure: COLONOSCOPY;  Surgeon: Gatha Mayer, MD;  Location: [facility];  Service: Endoscopy;  Laterality: N/A;  . CORONARY ANGIOPLASTY  1995   Pt thinks he got a balloon, living in [place], [place] N/A 02/07/2012   Procedure: IMPLANTABLE CARDIOVERTER DEFIBRILLATOR GENERATOR CHANGE;  Surgeon: Evans Lance, MD; Medtronic Evera XT VR single-chamber serial number [id number], Laterality: Left  . INSERT / REPLACE / REMOVE PACEMAKER  2004   Medtronic ICD  . KNEE ARTHROSCOPY Left 05/2003   Archie Endo 06/19/2010  . LAPAROSCOPIC CHOLECYSTECTOMY  1/ 2012  . SHOULDER ARTHROSCOPY W/ ROTATOR CUFF REPAIR Right twice  . TONSILLECTOMY AND ADENOIDECTOMY  ~ 1951    Family History  Problem Relation Age of Onset  . Diabetes Father   . Tracheal cancer Father 90    smoker  . Stroke Mother   . Cancer Sister     left eye  . CAD Neg Hx   . Colon cancer Neg Hx   . Prostate cancer Neg Hx  Social History   Social History  . Marital status: Married    Spouse name: N/A  . Number of children: 0  . Years of education: N/A   Occupational History  . [employer] truck driver (retired)    Social History Main Topics  . Smoking status: Former Smoker    Packs/day: 0.50    Years: 50.00    Types: Cigarettes, Cigars    Quit date: 09/27/2015  . Smokeless tobacco: Never Used  . Alcohol use No  . Drug use: No  . Sexual activity: Not Currently   Other Topics Concern  . Not on file   Social History Narrative   Lives with wife, married 1998   Grown children, 2 great grandchildren   Occupation: retired, was Probation officer   Activity: walking, fishing   Diet: good water daily, fruits/vegetables rare      Wife is Economist.    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 No known agent orange exposure.     Review of systems: Review of Systems  Constitutional: Negative for fever and chills.  HENT: Negative.   Eyes: Negative for blurred vision.  Respiratory: as per HPI  Cardiovascular: Negative for chest pain and palpitations.  Gastrointestinal: Negative for vomiting, diarrhea, blood per rectum. Genitourinary: Negative for dysuria, urgency, frequency and hematuria.  Musculoskeletal:  Negative for myalgias, back pain and joint pain.  Skin: Negative for itching and rash.  Neurological: Negative for dizziness, tremors, focal weakness, seizures and loss of consciousness.  Endo/Heme/Allergies: Negative for environmental allergies.  Psychiatric/Behavioral: Negative for depression, suicidal ideas and hallucinations.  All other systems reviewed and are negative.  Physical Exam: Blood pressure 100/60, pulse 77, height 5' 3" (1.6 m), weight 139 lb 9.6 oz (63.3 kg), SpO2 (!) 82 %. Gen:      No acute distress HEENT:  EOMI, sclera anicteric Neck:     No masses; no thyromegaly Lungs:    Scattered exp crackles; normal respiratory effort CV:         Regular rate and rhythm; no murmurs Abd:      + bowel sounds; soft, non-tender; no palpable masses, no distension Ext:    No edema; adequate peripheral perfusion Skin:      Warm and dry; no rash Neuro: alert and oriented x 3 Psych: normal mood and affect  Data Reviewed: Imaging [facility] 09/28/15 >> Prox RCA lesion 100%, Mid cx 70%. EF 25% estimated. Continue medical therapy, no PCI. CT Chest 11/17/15 >> Multi focal infiltrate involving the lower lobes, left greater than right, and the left upper lobe is most likely an infectious or inflammatory process. Recommend short-term follow-up to ensure resolution. 4 mm nodule in the right upper lobe. Recommend attention on short-term follow-up. TTE 11/30/15 >> EF 25-30% w/ akinesis of inferolateral & inferior myocardium. Grade 2 diastolic dysfunction. RV normal in size and function. CXR 12/19/15 >> small improvement in bilateral opacities. All images personally reviewed.  CP Serologies: CRP: 10.9 ESR: 40 PR-3: <3.5 DS DNA Ab: <1 MPO: <9 RF: 12.1 C3: 78 C4: 9 ANA: Negative ANCA:  Negative Anti-CCP:  19  Assessment:  Acute hypoxic resp failure Amiodarone toxicity He continues on the slow prednisone taper. He is at 10 mg and is still requiring some O2. I suspect that he may have  some permanent scarring from his inflammation and will require the supplemental oxygen for long-term. I will get another chest x-ray today and continue the taper to 34m as planned.  He has an autoimmune workup with borderline CCP and  rheumatoid factor. We will repeat them when he is off the prednisone completely. He will need a rheumatologyl consult if they turn positive or if he develops arthritis symptoms. He also need PFTs after he is off the prednisone.  Plan/Recommendations: - Continue prednisone taper - Follow up in clinic in 3 months                                                   Marshell Garfinkel MD [facility] Pager [phone] 03/05/2016, 3:15 PM  CC: Tonia Ghent, MD

## 2016-03-07 ENCOUNTER — Telehealth: Payer: Self-pay | Admitting: Pulmonary Disease

## 2016-03-07 NOTE — Telephone Encounter (Signed)
Will route message to PM to make him aware of this information.

## 2016-03-08 NOTE — Telephone Encounter (Addendum)
I called and discussed the CXR from 03/06/15 with him. It shows fibrotic changes which is likely a residual of his amiodarone toxicity. I have asked him to finish the slow taper of prednisone and stop taking the 10 mg daily dose. We will continue to monitor his symptoms. He will likely need a HRCT, PFTs and repeat of his immune serologies in the future. I will reassess at next clinic visit and order.   PM

## 2016-03-09 ENCOUNTER — Encounter (HOSPITAL_COMMUNITY): Payer: Self-pay | Admitting: Emergency Medicine

## 2016-03-09 ENCOUNTER — Inpatient Hospital Stay (HOSPITAL_COMMUNITY)
Admission: EM | Admit: 2016-03-09 | Discharge: 2016-03-13 | DRG: 309 | Disposition: A | Discharge status: Home / Self Care | Payer: Medicare Other | Attending: Cardiology | Admitting: Cardiology

## 2016-03-09 DIAGNOSIS — Z87891 Personal history of nicotine dependence: Secondary | ICD-10-CM

## 2016-03-09 DIAGNOSIS — Z961 Presence of intraocular lens: Secondary | ICD-10-CM | POA: Diagnosis present

## 2016-03-09 DIAGNOSIS — I5022 Chronic systolic (congestive) heart failure: Secondary | ICD-10-CM | POA: Diagnosis present

## 2016-03-09 DIAGNOSIS — Z9841 Cataract extraction status, right eye: Secondary | ICD-10-CM

## 2016-03-09 DIAGNOSIS — Z9049 Acquired absence of other specified parts of digestive tract: Secondary | ICD-10-CM

## 2016-03-09 DIAGNOSIS — I480 Paroxysmal atrial fibrillation: Secondary | ICD-10-CM | POA: Diagnosis present

## 2016-03-09 DIAGNOSIS — R000 Tachycardia, unspecified: Secondary | ICD-10-CM | POA: Diagnosis not present

## 2016-03-09 DIAGNOSIS — Z7952 Long term (current) use of systemic steroids: Secondary | ICD-10-CM

## 2016-03-09 DIAGNOSIS — Z7901 Long term (current) use of anticoagulants: Secondary | ICD-10-CM

## 2016-03-09 DIAGNOSIS — Z888 Allergy status to other drugs, medicaments and biological substances status: Secondary | ICD-10-CM

## 2016-03-09 DIAGNOSIS — L8990 Pressure ulcer of unspecified site, unspecified stage: Secondary | ICD-10-CM | POA: Insufficient documentation

## 2016-03-09 DIAGNOSIS — J449 Chronic obstructive pulmonary disease, unspecified: Secondary | ICD-10-CM | POA: Diagnosis present

## 2016-03-09 DIAGNOSIS — I252 Old myocardial infarction: Secondary | ICD-10-CM

## 2016-03-09 DIAGNOSIS — Z884 Allergy status to anesthetic agent status: Secondary | ICD-10-CM

## 2016-03-09 DIAGNOSIS — Z823 Family history of stroke: Secondary | ICD-10-CM

## 2016-03-09 DIAGNOSIS — I110 Hypertensive heart disease with heart failure: Secondary | ICD-10-CM | POA: Diagnosis present

## 2016-03-09 DIAGNOSIS — Z8679 Personal history of other diseases of the circulatory system: Secondary | ICD-10-CM

## 2016-03-09 DIAGNOSIS — Z95810 Presence of automatic (implantable) cardiac defibrillator: Secondary | ICD-10-CM

## 2016-03-09 DIAGNOSIS — Z885 Allergy status to narcotic agent status: Secondary | ICD-10-CM

## 2016-03-09 DIAGNOSIS — I481 Persistent atrial fibrillation: Secondary | ICD-10-CM | POA: Diagnosis present

## 2016-03-09 DIAGNOSIS — Z801 Family history of malignant neoplasm of trachea, bronchus and lung: Secondary | ICD-10-CM

## 2016-03-09 DIAGNOSIS — I472 Ventricular tachycardia, unspecified: Secondary | ICD-10-CM

## 2016-03-09 DIAGNOSIS — Z833 Family history of diabetes mellitus: Secondary | ICD-10-CM

## 2016-03-09 DIAGNOSIS — R079 Chest pain, unspecified: Secondary | ICD-10-CM | POA: Diagnosis not present

## 2016-03-09 DIAGNOSIS — R0602 Shortness of breath: Secondary | ICD-10-CM | POA: Diagnosis not present

## 2016-03-09 DIAGNOSIS — I255 Ischemic cardiomyopathy: Secondary | ICD-10-CM | POA: Diagnosis present

## 2016-03-09 DIAGNOSIS — I482 Chronic atrial fibrillation: Secondary | ICD-10-CM | POA: Diagnosis present

## 2016-03-09 DIAGNOSIS — I2510 Atherosclerotic heart disease of native coronary artery without angina pectoris: Secondary | ICD-10-CM | POA: Diagnosis present

## 2016-03-09 DIAGNOSIS — Z880 Allergy status to penicillin: Secondary | ICD-10-CM

## 2016-03-09 NOTE — ED Provider Notes (Signed)
[facility]EMERGENCY DEPT Provider Note   CSN: [id number] Arrival date & time: 03/09/16  2345  By signing my name below, I, Elder Negus, attest that this documentation has been prepared under the direction and in the presence of Nira Conn, MD. Electronically Signed: Elder Negus, Scribe. 03/10/16. 1:55 AM.   History   Chief Complaint Chief Complaint  Patient presents with  . Chest Pain    HPI [name] is a 71 y.o. male with extensive cardiac history including A-fib on Eliquis and recent admissions for HCAP, V-tach who presents to the ED for evaluation following two reported pacemaker/defibrillator firings. The patient states that approximately 90 minutes ago he was at home in no acute distress. He then became dyspneic and "his spine got hot" and felt "it coming on". He states that his pacemaker fired once which he notices by a "constriction in his chest". Approximately 20 minutes later he had another firing episode and called EMS. At interview, he denies any active chest pain; however he did have "a couple minutes" of "muscle spasm" in his L upper chest during EMS transit that resolved. He does have a home oxygen requirement of 3L; he has recently attempted weaning to 2L and at times takes of his nasal cannula altogether without complication. He is reporting a recent dry cough. However denies any fevers. No nasal congestion. He has no other complaints at this time. Of note, medic does report a highest BP at 88 mm Hg systolic pre-hospital; he also took 2x sublingual nitro at home.    Patient was recently admitted in December for V. tach at which point they tried amiodarone that resulted in long toxicity. Patient also allergic to lidocaine.   The history is provided by the patient. No language interpreter was used.    Past Medical History:  Diagnosis Date  . Acute lower GI bleeding 12/11/2011   "first time" (12/11/2011)  . Acute myocardial infarction, unspecified site,  episode of care unspecified 1995   Pt living in [place], no stent, ?PTCA  . AICD (automatic cardioverter/defibrillator) present   . Allergic rhinitis, cause unspecified   . Anxiety   . Arthritis    "all over" (11/07/2015)  . Atrial fibrillation (HCC)    a. 05/2015 - converted to sinus in setting of ICD shocks; placed on eliquis 5 bid.  [name] CAD (coronary artery disease), autologous vein bypass graft   . Cervical herniated disc    told not to lift >10 lbs  . Chronic systolic CHF (congestive heart failure), NYHA class 2 (HCC)    Reports EF of 25%.   [name] COPD (chronic obstructive pulmonary disease) (HCC) 11/2012   by xray  . Diverticulosis    by CT scan  . HCAP (healthcare-associated pneumonia) 11/06/2015  . Hypertension   . Insomnia   . Paroxysmal ventricular tachycardia (HCC)   . Perennial allergic rhinitis    only to dust mites  . Pneumonia 2000s   "walking pneumonia"  . VT (ventricular tachycardia) (HCC)    a. 05/2015 - VT storm with multiple ICD shocks-->Amio 400 BID.    Patient Active Problem List   Diagnosis Date Noted  . Hyperkalemia 02/13/2016  . Acute respiratory failure with hypoxemia (HCC)   . SOB (shortness of breath)   . Pleural effusion   . AKI (acute kidney injury) (HCC)   . Centrilobular emphysema (HCC)   . ARDS (adult respiratory distress syndrome) (HCC)   . Acute pulmonary edema (HCC)   . Hypoxemia   .  Postcholecystectomy diarrhea 12/12/2015  . Coronary artery disease involving native coronary artery with angina pectoris (HCC) 12/12/2015  . Loose stools   . Unstable angina pectoris (HCC)   . Hypoalbuminemia due to protein-calorie malnutrition (HCC)   . Physical debility 12/06/2015  . Amiodarone toxicity   . Anemia of chronic disease   . Anxiety about health   . Chronic systolic CHF (congestive heart failure) (HCC)   . Coronary artery disease involving native coronary artery of native heart without angina pectoris   . Diarrhea   . History of GI bleed   .  History of ventricular fibrillation   . Lymphocytosis   . PAF (paroxysmal atrial fibrillation) (HCC)   . Respiratory distress   . S/P implantation of automatic cardioverter/defibrillator (AICD)   . Supplemental oxygen dependent   . Acute hypoxemic respiratory failure (HCC)   . Malnutrition of moderate degree 11/29/2015  . Opacity of lung on imaging study   . Physical deconditioning 11/28/2015  . Transaminitis 11/28/2015  . Anemia 11/28/2015  . Elevated troponin   . Hypoxia   . AICD (automatic cardioverter/defibrillator) present 11/08/2015  . HCAP (healthcare-associated pneumonia) 11/07/2015  . Acute respiratory failure with hypoxia (HCC) 11/07/2015  . Fall at home 10/11/2015  . VT (ventricular tachycardia) (HCC) 09/27/2015  . Memory loss 09/24/2015  . Encounter for screening examination for infectious disease 09/24/2015  . Chronic atrial fibrillation (HCC) 05/28/2015  . History of ventricular tachycardia 05/25/2015  . Syncope 05/12/2015  . Bradycardia 05/12/2015  . Advance care planning 09/16/2014  . Muscle ache 09/16/2014  . Back pain 09/08/2013  . Abdominal pain, chronic, epigastric 09/08/2013  . Fatigue 06/11/2013  . Chest wall mass 11/05/2012  . Arthritis   . Insomnia   . Anxiety   . Diverticulosis 12/11/2011  . Tobacco abuse 03/06/2011  . Ischemic cardiomyopathy 02/12/2011  . Acute on chronic systolic congestive heart failure (HCC) 05/10/2010  . Coronary atherosclerosis 10/31/2008  . Hyperlipidemia 08/25/2008  . ALLERGIC RHINITIS 08/25/2008  . Amiodarone pulmonary toxicity 08/25/2008    Past Surgical History:  Procedure Laterality Date  . CARDIAC CATHETERIZATION  2004   LAD 30%, D1 30%, CFX-AV groove 70-80%, OM1 30%, EF 20-25%  . CARDIAC CATHETERIZATION N/A 09/28/2015   Procedure: Left Heart Cath and Coronary Angiography;  Surgeon: Peter M Swaziland, MD;  Location: [facility] INVASIVE CV LAB;  Service: Cardiovascular;  Laterality: N/A;  . CARDIAC DEFIBRILLATOR PLACEMENT   2004  . CATARACT EXTRACTION W/ INTRAOCULAR LENS IMPLANT Right 01/2012  . COLONOSCOPY  01/08/2012   Procedure: COLONOSCOPY;  Surgeon: Iva Boop, MD;  Location: [facility] ENDOSCOPY;  Service: Endoscopy;  Laterality: N/A;  . CORONARY ANGIOPLASTY  1995   Pt thinks he got a balloon, living in [place], [place]  . IMPLANTABLE CARDIOVERTER DEFIBRILLATOR GENERATOR CHANGE N/A 02/07/2012   Procedure: IMPLANTABLE CARDIOVERTER DEFIBRILLATOR GENERATOR CHANGE;  Surgeon: Marinus Maw, MD; Medtronic Evera XT VR single-chamber serial number [id number], Laterality: Left  . INSERT / REPLACE / REMOVE PACEMAKER  2004   Medtronic ICD  . KNEE ARTHROSCOPY Left 05/2003   Hattie Perch 06/19/2010  . LAPAROSCOPIC CHOLECYSTECTOMY  1/ 2012  . SHOULDER ARTHROSCOPY W/ ROTATOR CUFF REPAIR Right twice  . TONSILLECTOMY AND ADENOIDECTOMY  ~ 1951       Home Medications    Prior to Admission medications   Medication Sig Start Date End Date Taking? Authorizing Provider  acetaminophen (TYLENOL) 325 MG tablet Take 2 tablets (650 mg total) by mouth every 6 (six) hours as needed for mild  pain (or Fever >/= 101). 12/06/15   Joseph Art, DO  ALPRAZolam (XANAX) 0.25 MG tablet TAKE 1 TABLET BY MOUTH 3 TIMES DAILY AS NEEDED FOR ANXIETY OR SLEEP 02/12/16   Joaquim Nam, MD  apixaban (ELIQUIS) 5 MG TABS tablet Take 1 tablet (5 mg total) by mouth 2 (two) times daily. 02/20/16   Joaquim Nam, MD  benzonatate (TESSALON) 200 MG capsule Take 1 capsule (200 mg total) by mouth every 4 (four) hours as needed for cough. 12/06/15   Joseph Art, DO  carvedilol (COREG) 3.125 MG tablet Take 1 tablet (3.125 mg total) by mouth 2 (two) times daily with a meal. 12/15/15   Evlyn Kanner Love, PA-C  Chlorpheniramine-DM (CORICIDIN HBP COUGH/COLD PO) Take 2 capsules by mouth as needed.    Historical Provider, MD  donepezil (ARICEPT) 10 MG tablet TAKE 1 TABLET (10 MG TOTAL) BY MOUTH AT BEDTIME. 02/06/16   Joaquim Nam, MD  fexofenadine-pseudoephedrine (ALLEGRA-D 24)  180-240 MG 24 hr tablet Take 1 tablet by mouth daily.    Historical Provider, MD  furosemide (LASIX) 20 MG tablet TAKE 1 TABLET (20 MG TOTAL) BY MOUTH EVERY MONDAY, WEDNESDAY, AND FRIDAY. 02/07/16   Joaquim Nam, MD  guaiFENesin (MUCINEX) 600 MG 12 hr tablet Take 600 mg by mouth 2 (two) times daily.     Historical Provider, MD  HYDROcodone-acetaminophen (NORCO/VICODIN) 5-325 MG tablet Take 1 tablet by mouth every 6 (six) hours as needed for moderate pain.    Historical Provider, MD  loperamide (IMODIUM) 2 MG capsule Take 1 capsule (2 mg total) by mouth as needed for diarrhea or loose stools. 11/21/15   Dorothea Ogle, MD  losartan (COZAAR) 25 MG tablet Take 0.5 tablets (12.5 mg total) by mouth daily. 12/15/15   Jacquelynn Cree, PA-C  metaxalone (SKELAXIN) 800 MG tablet Take 1 tablet (800 mg total) by mouth daily as needed for muscle spasms. 02/12/16   Joaquim Nam, MD  mexiletine (MEXITIL) 200 MG capsule Take 1 capsule (200 mg total) by mouth every 8 (eight) hours. 02/22/16   Dayna N Dunn, PA-C  nitroGLYCERIN (NITROSTAT) 0.4 MG SL tablet PLACE 1 TABLET (0.4 MG TOTAL) UNDER THE TONGUE EVERY 5 (FIVE) MINUTES AS NEEDED. FOR CHEST PAIN. 11/20/15   Joaquim Nam, MD  predniSONE (DELTASONE) 20 MG tablet Take 0.5 tablets (10 mg total) by mouth daily with breakfast. 02/12/16   Joaquim Nam, MD  promethazine (PHENERGAN) 25 MG tablet Take 25 mg by mouth every 6 (six) hours as needed for nausea or vomiting.    Historical Provider, MD  RANEXA 500 MG 12 hr tablet TAKE 1 TABLET (500 MG TOTAL) BY MOUTH 2 (TWO) TIMES DAILY. 02/07/16   Amber Caryl Bis, NP  ranitidine (ZANTAC) 150 MG capsule Take 150 mg by mouth daily as needed for heartburn.     Historical Provider, MD    Family History Family History  Problem Relation Age of Onset  . Diabetes Father   . Tracheal cancer Father 15    smoker  . Stroke Mother   . Cancer Sister     left eye  . CAD Neg Hx   . Colon cancer Neg Hx   . Prostate cancer Neg Hx      Social History Social History  Substance Use Topics  . Smoking status: Former Smoker    Packs/day: 0.50    Years: 50.00    Types: Cigarettes, Cigars    Quit date: 09/27/2015  .  Smokeless tobacco: Never Used  . Alcohol use No     Allergies   Ace inhibitors; Codeine; Doxycycline; Kionex [sodium polystyrene sulfonate]; Penicillins; Lisinopril; and Statins   Review of Systems Review of Systems 10 systems reviewed and all are negative for acute change except as noted in the HPI.   Physical Exam Updated Vital Signs BP 91/60   Pulse 65   Resp 15   SpO2 99%   Physical Exam  Constitutional: He is oriented to person, place, and time. He appears well-developed and well-nourished. No distress.  HENT:  Head: Normocephalic and atraumatic.  Nose: Nose normal.  Eyes: Conjunctivae and EOM are normal. Pupils are equal, round, and reactive to light. Right eye exhibits no discharge. Left eye exhibits no discharge. No scleral icterus.  Neck: Normal range of motion. Neck supple.  Cardiovascular: Normal rate and regular rhythm.  Exam reveals no gallop and no friction rub.   No murmur heard. Pulmonary/Chest: Effort normal and breath sounds normal. No stridor. No respiratory distress. He has no rales.  Abdominal: Soft. He exhibits no distension. There is no tenderness.  Musculoskeletal: He exhibits no edema or tenderness.  Neurological: He is alert and oriented to person, place, and time.  Skin: Skin is warm and dry. No rash noted. He is not diaphoretic. No erythema.  Psychiatric: He has a normal mood and affect.  Vitals reviewed.     ED Treatments / Results  DIAGNOSTIC STUDIES: Oxygen Saturation is 99 percent on 4L nasal cannula which is adequate by my interpretation.    COORDINATION OF CARE: 12:04 AM Discussed treatment plan with pt at bedside and pt agreed to plan.  Labs (all labs ordered are listed, but only abnormal results are displayed) Labs Reviewed  CBC WITH  DIFFERENTIAL/PLATELET - Abnormal; Notable for the following:       Result Value   RBC 4.01 (*)    Hemoglobin 10.0 (*)    HCT 33.1 (*)    MCH 24.9 (*)    RDW 16.8 (*)    All other components within normal limits  BASIC METABOLIC PANEL - Abnormal; Notable for the following:    Glucose, Bld 132 (*)    Calcium 8.5 (*)    All other components within normal limits  BRAIN NATRIURETIC PEPTIDE - Abnormal; Notable for the following:    B Natriuretic Peptide 165.6 (*)    All other components within normal limits  I-STAT TROPOININ, ED    EKG  EKG Interpretation  Date/Time:  Saturday March 09 2016 23:55:04 EST Ventricular Rate:  71 PR Interval:    QRS Duration: 176 QT Interval:  423 QTC Calculation: 460 R Axis:   137 Text Interpretation:  RBBB and LPFB Baseline wander in lead(s) I III aVL aVF Otherwise no significant change Confirmed by [facility] MD, PEDRO ([id number]) on 03/10/2016 1:48:20 AM       Radiology Dg Chest Port 1 View  Result Date: 03/10/2016 CLINICAL DATA:  Chest pain. EXAM: PORTABLE CHEST 1 VIEW COMPARISON:  03/05/2016 FINDINGS: Unchanged marked cardiomegaly. Unchanged generalized interstitial coarsening, likely chronic. No airspace consolidation. No effusions. Grossly intact appearances of the transvenous cardiac leads. IMPRESSION: Unchanged cardiomegaly and chronic appearing interstitial coarsening. No consolidation or effusion. Electronically Signed   By: Ellery Plunk M.D.   On: 03/10/2016 00:31    Procedures Procedures (including critical care time)  Medications Ordered in ED Medications  sodium chloride 0.9 % bolus 500 mL (not administered)  ondansetron (ZOFRAN) injection 4 mg (not administered)  sodium chloride  0.9 % bolus 500 mL (500 mLs Intravenous New Bag/Given 03/10/16 0025)     Initial Impression / Assessment and Plan / ED Course  I have reviewed the triage vital signs and the nursing notes.  Pertinent labs & imaging results that were available during my  care of the patient were reviewed by me and considered in my medical decision making (see chart for details).     Device interrogation reveals 2 defibrillation shocks for V. tach and febrile and tachycardic pacing. Screening labs obtained and are grossly reassuring. Chest x-ray without evidence of pneumonia or volume overload. His cast case with cardiology who will admit the patient for further management.  Final Clinical Impressions(s) / ED Diagnoses   Final diagnoses:  SOB (shortness of breath)  V tach (HCC)      Nira Conn, MD 03/10/16 0201

## 2016-03-09 NOTE — ED Triage Notes (Signed)
Pt c/o chest pain. Pt in afib with pacemaker firing. Was given 324 asa and 3 nitro en route from [facility]

## 2016-03-10 ENCOUNTER — Emergency Department (HOSPITAL_COMMUNITY): Payer: Medicare Other

## 2016-03-10 DIAGNOSIS — Z961 Presence of intraocular lens: Secondary | ICD-10-CM | POA: Diagnosis not present

## 2016-03-10 DIAGNOSIS — I472 Ventricular tachycardia, unspecified: Secondary | ICD-10-CM

## 2016-03-10 DIAGNOSIS — Z823 Family history of stroke: Secondary | ICD-10-CM | POA: Diagnosis not present

## 2016-03-10 DIAGNOSIS — Z95810 Presence of automatic (implantable) cardiac defibrillator: Secondary | ICD-10-CM | POA: Diagnosis not present

## 2016-03-10 DIAGNOSIS — I480 Paroxysmal atrial fibrillation: Secondary | ICD-10-CM

## 2016-03-10 DIAGNOSIS — I482 Chronic atrial fibrillation: Secondary | ICD-10-CM | POA: Diagnosis not present

## 2016-03-10 DIAGNOSIS — I110 Hypertensive heart disease with heart failure: Secondary | ICD-10-CM | POA: Diagnosis not present

## 2016-03-10 DIAGNOSIS — Z884 Allergy status to anesthetic agent status: Secondary | ICD-10-CM | POA: Diagnosis not present

## 2016-03-10 DIAGNOSIS — Z87891 Personal history of nicotine dependence: Secondary | ICD-10-CM | POA: Diagnosis not present

## 2016-03-10 DIAGNOSIS — Z801 Family history of malignant neoplasm of trachea, bronchus and lung: Secondary | ICD-10-CM | POA: Diagnosis not present

## 2016-03-10 DIAGNOSIS — Z833 Family history of diabetes mellitus: Secondary | ICD-10-CM | POA: Diagnosis not present

## 2016-03-10 DIAGNOSIS — Z888 Allergy status to other drugs, medicaments and biological substances status: Secondary | ICD-10-CM | POA: Diagnosis not present

## 2016-03-10 DIAGNOSIS — I5022 Chronic systolic (congestive) heart failure: Secondary | ICD-10-CM

## 2016-03-10 DIAGNOSIS — I252 Old myocardial infarction: Secondary | ICD-10-CM | POA: Diagnosis not present

## 2016-03-10 DIAGNOSIS — Z7901 Long term (current) use of anticoagulants: Secondary | ICD-10-CM | POA: Diagnosis not present

## 2016-03-10 DIAGNOSIS — I255 Ischemic cardiomyopathy: Secondary | ICD-10-CM

## 2016-03-10 DIAGNOSIS — Z880 Allergy status to penicillin: Secondary | ICD-10-CM | POA: Diagnosis not present

## 2016-03-10 DIAGNOSIS — Z7952 Long term (current) use of systemic steroids: Secondary | ICD-10-CM | POA: Diagnosis not present

## 2016-03-10 DIAGNOSIS — R079 Chest pain, unspecified: Secondary | ICD-10-CM | POA: Diagnosis not present

## 2016-03-10 DIAGNOSIS — Z885 Allergy status to narcotic agent status: Secondary | ICD-10-CM | POA: Diagnosis not present

## 2016-03-10 DIAGNOSIS — I2510 Atherosclerotic heart disease of native coronary artery without angina pectoris: Secondary | ICD-10-CM | POA: Diagnosis not present

## 2016-03-10 DIAGNOSIS — Z9049 Acquired absence of other specified parts of digestive tract: Secondary | ICD-10-CM | POA: Diagnosis not present

## 2016-03-10 DIAGNOSIS — J449 Chronic obstructive pulmonary disease, unspecified: Secondary | ICD-10-CM | POA: Diagnosis not present

## 2016-03-10 DIAGNOSIS — Z9841 Cataract extraction status, right eye: Secondary | ICD-10-CM | POA: Diagnosis not present

## 2016-03-10 DIAGNOSIS — I481 Persistent atrial fibrillation: Secondary | ICD-10-CM | POA: Diagnosis not present

## 2016-03-10 LAB — MAGNESIUM: MAGNESIUM: 1.7 mg/dL (ref 1.7–2.4)

## 2016-03-10 LAB — CBC
HCT: 30.6 % — ABNORMAL LOW (ref 39.0–52.0)
Hemoglobin: 9 g/dL — ABNORMAL LOW (ref 13.0–17.0)
MCH: 24.5 pg — ABNORMAL LOW (ref 26.0–34.0)
MCHC: 29.4 g/dL — AB (ref 30.0–36.0)
MCV: 83.2 fL (ref 78.0–100.0)
PLATELETS: 348 10*3/uL (ref 150–400)
RBC: 3.68 MIL/uL — ABNORMAL LOW (ref 4.22–5.81)
RDW: 16.4 % — ABNORMAL HIGH (ref 11.5–15.5)
WBC: 9.2 10*3/uL (ref 4.0–10.5)

## 2016-03-10 LAB — BASIC METABOLIC PANEL
Anion gap: 13 (ref 5–15)
BUN: 20 mg/dL (ref 6–20)
CHLORIDE: 103 mmol/L (ref 101–111)
CO2: 23 mmol/L (ref 22–32)
CREATININE: 1.11 mg/dL (ref 0.61–1.24)
Calcium: 8.5 mg/dL — ABNORMAL LOW (ref 8.9–10.3)
GFR calc Af Amer: >60 mL/min (ref >60)
GFR calc non Af Amer: >60 mL/min (ref >60)
Glucose, Bld: 132 mg/dL — ABNORMAL HIGH (ref 65–99)
Potassium: 3.9 mmol/L (ref 3.5–5.1)
SODIUM: 139 mmol/L (ref 135–145)

## 2016-03-10 LAB — CBC WITH DIFFERENTIAL/PLATELET
BASOS PCT: 0 %
Basophils Absolute: 0 10*3/uL (ref 0.0–0.1)
EOS ABS: 0.4 10*3/uL (ref 0.0–0.7)
Eosinophils Relative: 4 %
HCT: 33.1 % — ABNORMAL LOW (ref 39.0–52.0)
HEMOGLOBIN: 10 g/dL — AB (ref 13.0–17.0)
LYMPHS ABS: 2.8 10*3/uL (ref 0.7–4.0)
Lymphocytes Relative: 27 %
MCH: 24.9 pg — AB (ref 26.0–34.0)
MCHC: 30.2 g/dL (ref 30.0–36.0)
MCV: 82.5 fL (ref 78.0–100.0)
MONO ABS: 0.9 10*3/uL (ref 0.1–1.0)
Monocytes Relative: 9 %
NEUTROS ABS: 6.1 10*3/uL (ref 1.7–7.7)
Neutrophils Relative %: 60 %
PLATELETS: 374 10*3/uL (ref 150–400)
RBC: 4.01 MIL/uL — ABNORMAL LOW (ref 4.22–5.81)
RDW: 16.8 % — ABNORMAL HIGH (ref 11.5–15.5)
WBC: 10.2 10*3/uL (ref 4.0–10.5)

## 2016-03-10 LAB — COMPREHENSIVE METABOLIC PANEL
ALT: 13 U/L — AB (ref 17–63)
ANION GAP: 7 (ref 5–15)
AST: 17 U/L (ref 15–41)
Albumin: 2.5 g/dL — ABNORMAL LOW (ref 3.5–5.0)
Alkaline Phosphatase: 44 U/L (ref 38–126)
BUN: 20 mg/dL (ref 6–20)
CHLORIDE: 106 mmol/L (ref 101–111)
CO2: 26 mmol/L (ref 22–32)
Calcium: 8.1 mg/dL — ABNORMAL LOW (ref 8.9–10.3)
Creatinine, Ser: 1.07 mg/dL (ref 0.61–1.24)
Glucose, Bld: 94 mg/dL (ref 65–99)
POTASSIUM: 4 mmol/L (ref 3.5–5.1)
SODIUM: 139 mmol/L (ref 135–145)
Total Bilirubin: 0.9 mg/dL (ref 0.3–1.2)
Total Protein: 5.1 g/dL — ABNORMAL LOW (ref 6.5–8.1)

## 2016-03-10 LAB — TROPONIN I
TROPONIN I: 0.05 ng/mL — AB (ref <0.03)
TROPONIN I: 0.04 ng/mL — AB (ref <0.03)
Troponin I: 0.05 ng/mL (ref <0.03)

## 2016-03-10 LAB — MRSA PCR SCREENING: MRSA by PCR: NEGATIVE

## 2016-03-10 LAB — I-STAT TROPONIN, ED: Troponin i, poc: 0.01 ng/mL (ref 0.00–0.08)

## 2016-03-10 LAB — BRAIN NATRIURETIC PEPTIDE: B NATRIURETIC PEPTIDE 5: 165.6 pg/mL — AB (ref 0.0–100.0)

## 2016-03-10 LAB — PHOSPHORUS: PHOSPHORUS: 4.4 mg/dL (ref 2.5–4.6)

## 2016-03-10 MED ORDER — NITROGLYCERIN 0.4 MG SL SUBL
0.4000 mg | SUBLINGUAL_TABLET | SUBLINGUAL | Status: DC | PRN
Start: 2016-03-10 — End: 2016-03-13
  Administered 2016-03-10: 0.4 mg via SUBLINGUAL

## 2016-03-10 MED ORDER — SODIUM CHLORIDE 0.9 % IV BOLUS (SEPSIS)
500.0000 mL | Freq: Once | INTRAVENOUS | Status: AC
  Administered 2016-03-10: 500 mL via INTRAVENOUS

## 2016-03-10 MED ORDER — LOSARTAN POTASSIUM 25 MG PO TABS
12.5000 mg | ORAL_TABLET | Freq: Every day | ORAL | Status: DC
  Administered 2016-03-10 – 2016-03-13 (×4): 12.5 mg via ORAL
  Filled 2016-03-10 (×2): qty 1
  Filled 2016-03-10: qty 0.5
  Filled 2016-03-10: qty 1

## 2016-03-10 MED ORDER — BENZONATATE 100 MG PO CAPS: 200.0000 mg | ORAL_CAPSULE | ORAL | Status: DC | PRN

## 2016-03-10 MED ORDER — ALPRAZOLAM 0.25 MG PO TABS
0.2500 mg | ORAL_TABLET | Freq: Two times a day (BID) | ORAL | Status: DC | PRN
  Administered 2016-03-10 – 2016-03-11 (×2): 0.25 mg via ORAL
  Filled 2016-03-10 (×2): qty 1

## 2016-03-10 MED ORDER — DONEPEZIL HCL 10 MG PO TABS
10.0000 mg | ORAL_TABLET | Freq: Every day | ORAL | Status: DC
  Administered 2016-03-10 – 2016-03-12 (×3): 10 mg via ORAL
  Filled 2016-03-10 (×3): qty 1

## 2016-03-10 MED ORDER — SODIUM CHLORIDE 0.9% FLUSH
3.0000 mL | Freq: Two times a day (BID) | INTRAVENOUS | Status: DC
  Administered 2016-03-10 – 2016-03-13 (×6): 3 mL via INTRAVENOUS

## 2016-03-10 MED ORDER — CARVEDILOL 3.125 MG PO TABS
3.1250 mg | ORAL_TABLET | Freq: Two times a day (BID) | ORAL | Status: DC
  Administered 2016-03-10 – 2016-03-12 (×6): 3.125 mg via ORAL
  Filled 2016-03-10 (×7): qty 1

## 2016-03-10 MED ORDER — GUAIFENESIN ER 600 MG PO TB12
600.0000 mg | ORAL_TABLET | Freq: Two times a day (BID) | ORAL | Status: DC
Start: 2016-03-10 — End: 2016-03-13
  Administered 2016-03-10 – 2016-03-13 (×7): 600 mg via ORAL
  Filled 2016-03-10 (×7): qty 1

## 2016-03-10 MED ORDER — FUROSEMIDE 20 MG PO TABS
20.0000 mg | ORAL_TABLET | ORAL | Status: DC
  Administered 2016-03-11 – 2016-03-13 (×2): 20 mg via ORAL
  Filled 2016-03-10 (×2): qty 1

## 2016-03-10 MED ORDER — SODIUM CHLORIDE 0.9% FLUSH
3.0000 mL | Freq: Two times a day (BID) | INTRAVENOUS | Status: DC
  Administered 2016-03-11 – 2016-03-12 (×2): 3 mL via INTRAVENOUS

## 2016-03-10 MED ORDER — FAMOTIDINE 20 MG PO TABS
20.0000 mg | ORAL_TABLET | Freq: Every day | ORAL | Status: DC
  Administered 2016-03-10 – 2016-03-13 (×4): 20 mg via ORAL
  Filled 2016-03-10 (×4): qty 1

## 2016-03-10 MED ORDER — ONDANSETRON HCL 4 MG/2ML IJ SOLN
4.0000 mg | Freq: Once | INTRAMUSCULAR | Status: DC
  Filled 2016-03-10 (×2): qty 2

## 2016-03-10 MED ORDER — SODIUM CHLORIDE 0.9 % IV BOLUS (SEPSIS): 500.0000 mL | Freq: Once | INTRAVENOUS | Status: DC

## 2016-03-10 MED ORDER — SODIUM CHLORIDE 0.9% FLUSH: 3.0000 mL | INTRAVENOUS | Status: DC | PRN

## 2016-03-10 MED ORDER — SODIUM CHLORIDE 0.9 % IV SOLN: 250.0000 mL | INTRAVENOUS | Status: DC | PRN

## 2016-03-10 MED ORDER — NITROGLYCERIN 0.4 MG SL SUBL
SUBLINGUAL_TABLET | SUBLINGUAL | Status: AC
  Filled 2016-03-10: qty 1

## 2016-03-10 MED ORDER — LOPERAMIDE HCL 2 MG PO CAPS
2.0000 mg | ORAL_CAPSULE | ORAL | Status: DC | PRN
  Administered 2016-03-11 – 2016-03-12 (×3): 2 mg via ORAL
  Filled 2016-03-10 (×3): qty 1

## 2016-03-10 MED ORDER — APIXABAN 5 MG PO TABS
5.0000 mg | ORAL_TABLET | Freq: Two times a day (BID) | ORAL | Status: DC
  Administered 2016-03-10 – 2016-03-13 (×7): 5 mg via ORAL
  Filled 2016-03-10 (×7): qty 1

## 2016-03-10 MED ORDER — RANOLAZINE ER 500 MG PO TB12
500.0000 mg | ORAL_TABLET | Freq: Two times a day (BID) | ORAL | Status: DC
  Administered 2016-03-10 – 2016-03-13 (×7): 500 mg via ORAL
  Filled 2016-03-10 (×7): qty 1

## 2016-03-10 MED ORDER — MEXILETINE HCL 200 MG PO CAPS
200.0000 mg | ORAL_CAPSULE | Freq: Three times a day (TID) | ORAL | Status: DC
  Administered 2016-03-10 – 2016-03-13 (×10): 200 mg via ORAL
  Filled 2016-03-10 (×12): qty 1

## 2016-03-10 MED ORDER — HYDROCODONE-ACETAMINOPHEN 5-325 MG PO TABS
1.0000 | ORAL_TABLET | Freq: Four times a day (QID) | ORAL | Status: DC | PRN
  Administered 2016-03-11 – 2016-03-12 (×2): 1 via ORAL
  Filled 2016-03-10 (×2): qty 1

## 2016-03-10 NOTE — H&P (Addendum)
Admit date: 03/09/2016 Referring Physician: Dr. Eudelia Bunch Primary Cardiologist: Dr. Lewayne Bunting Chief complaint/reason for admission:ICD discharge  HPI: This is a 71yo male with a history of an ischemic cardiomyopathy, chronic systolic heart failure class III, chronic atrial fibrillation, previously unwilling to take any anticoagulation, ventricular tachycardia, status post ICD implantation. He was in the hospital several months ago with VT storm. He was placed on Eliquis as he converted back to NSR after being out of rhythm for over a year. The patient was placed on amiodarone to prevent VT. He developed amio lung toxicity and the amio was stopped.He was last seen by Dr. [name] 02/28/2016 and had had 2 ICD shocks last week, appropriate for VT after ATP failed.  He is currently on Mexitil 200mg  TID.    He presented to the ER tonight after his AICD shocked him twice and reportedly the AICD was interrogated and showed VT with 2 appropriate shocks and several attempts at ATP that failed.  Cardiology is now asked to admit patient for further treatment. The patient states that he was told by his pulmonologist to try to start weaning off of his O2.  He says that he got ready to go out to dinner and before he went into the restaurant he took his O2 off.  When he finished eating he put it back on in the car.  He then went to the drug store and walked around without the O2 and started to feel short winded and has some pressure on his chest.  He went back out in the car and put his O2 on.  He took a total of 4 SL NTG and pain subsided.  He     PMH:    Past Medical History:  Diagnosis Date  . Acute lower GI bleeding 12/11/2011   "first time" (12/11/2011)  . Acute myocardial infarction, unspecified site, episode of care unspecified 1995   Pt living in [place], no stent, ?PTCA  . AICD (automatic cardioverter/defibrillator) present   . Allergic rhinitis, cause unspecified   . Anxiety   . Arthritis    "all  over" (11/07/2015)  . Atrial fibrillation (HCC)    a. 05/2015 - converted to sinus in setting of ICD shocks; placed on eliquis 5 bid.  [name] CAD (coronary artery disease), autologous vein bypass graft   . Cervical herniated disc    told not to lift >10 lbs  . Chronic systolic CHF (congestive heart failure), NYHA class 2 (HCC)    Reports EF of 25%.   [name] COPD (chronic obstructive pulmonary disease) (HCC) 11/2012   by xray  . Diverticulosis    by CT scan  . HCAP (healthcare-associated pneumonia) 11/06/2015  . Hypertension   . Insomnia   . Paroxysmal ventricular tachycardia (HCC)   . Perennial allergic rhinitis    only to dust mites  . Pneumonia 2000s   "walking pneumonia"  . VT (ventricular tachycardia) (HCC)    a. 05/2015 - VT storm with multiple ICD shocks-->Amio 400 BID.    PSH:    Past Surgical History:  Procedure Laterality Date  . CARDIAC CATHETERIZATION  2004   LAD 30%, D1 30%, CFX-AV groove 70-80%, OM1 30%, EF 20-25%  . CARDIAC CATHETERIZATION N/A 09/28/2015   Procedure: Left Heart Cath and Coronary Angiography;  Surgeon: Peter M Swaziland, MD;  Location: [facility] INVASIVE CV LAB;  Service: Cardiovascular;  Laterality: N/A;  . CARDIAC DEFIBRILLATOR PLACEMENT  2004  . CATARACT EXTRACTION W/ INTRAOCULAR LENS IMPLANT Right 01/2012  .  COLONOSCOPY  01/08/2012   Procedure: COLONOSCOPY;  Surgeon: Iva Boop, MD;  Location: [facility] ENDOSCOPY;  Service: Endoscopy;  Laterality: N/A;  . CORONARY ANGIOPLASTY  1995   Pt thinks he got a balloon, living in [place], [place]  . IMPLANTABLE CARDIOVERTER DEFIBRILLATOR GENERATOR CHANGE N/A 02/07/2012   Procedure: IMPLANTABLE CARDIOVERTER DEFIBRILLATOR GENERATOR CHANGE;  Surgeon: Marinus Maw, MD; Medtronic Evera XT VR single-chamber serial number [id number], Laterality: Left  . INSERT / REPLACE / REMOVE PACEMAKER  2004   Medtronic ICD  . KNEE ARTHROSCOPY Left 05/2003   Hattie Perch 06/19/2010  . LAPAROSCOPIC CHOLECYSTECTOMY  1/ 2012  . SHOULDER ARTHROSCOPY W/ ROTATOR  CUFF REPAIR Right twice  . TONSILLECTOMY AND ADENOIDECTOMY  ~ 1951    ALLERGIES:   Ace inhibitors; Codeine; Doxycycline; Kionex [sodium polystyrene sulfonate]; Penicillins; Lisinopril; and Statins  Prior to Admit Meds:   (Not in a hospital admission) Family HX:    Family History  Problem Relation Age of Onset  . Diabetes Father   . Tracheal cancer Father 51    smoker  . Stroke Mother   . Cancer Sister     left eye  . CAD Neg Hx   . Colon cancer Neg Hx   . Prostate cancer Neg Hx    Social HX:    Social History   Social History  . Marital status: Married    Spouse name: N/A  . Number of children: 0  . Years of education: N/A   Occupational History  . [employer] truck driver (retired)    Social History Main Topics  . Smoking status: Former Smoker    Packs/day: 0.50    Years: 50.00    Types: Cigarettes, Cigars    Quit date: 09/27/2015  . Smokeless tobacco: Never Used  . Alcohol use No  . Drug use: No  . Sexual activity: Not Currently   Other Topics Concern  . Not on file   Social History Narrative   Lives with wife, married 1998   Grown children, 2 great grandchildren   Occupation: retired, was Presenter, broadcasting   Activity: walking, fishing   Diet: good water daily, fruits/vegetables rare      Wife is Product manager.    [id number], Human resources officer. No known agent orange exposure.       ROS:  All 11 ROS were addressed and are negative except what is stated in the HPI  PHYSICAL EXAM Vitals:   03/10/16 0000 03/10/16 0045  BP: (!) 88/58 98/71  Pulse: 74 70  Resp: 16 21   General: Well developed, well nourished, in no acute distress Head: Eyes PERRLA, No xanthomas.   Normal cephalic and atramatic  Lungs:   Clear bilaterally to auscultation and percussion. Heart:   HRRR S1 S2 Pulses are 2+ & equal.            No carotid bruit. No JVD.  No abdominal bruits. No femoral bruits. Abdomen: Bowel sounds are positive, abdomen soft and non-tender without masses  Msk:  Back normal, normal  gait. Normal strength and tone for age. Extremities:   No clubbing, cyanosis or edema.  DP +1 Neuro: Alert and oriented X 3. Psych:  Good affect, responds appropriately   Labs:   Lab Results  Component Value Date   WBC 10.2 03/10/2016   HGB 10.0 (L) 03/10/2016   HCT 33.1 (L) 03/10/2016   MCV 82.5 03/10/2016   PLT 374 03/10/2016    Recent Labs Lab 03/10/16 0010  NA 139  K 3.9  CL 103  CO2 23  BUN 20  CREATININE 1.11  CALCIUM 8.5*  GLUCOSE 132*   Lab Results  Component Value Date   CKTOTAL 31 05/30/2010   CKMB 1.8 05/30/2010   TROPONINI 0.04 (HH) 12/13/2015   No results found for: PTT Lab Results  Component Value Date   INR 1.13 05/25/2015     Lab Results  Component Value Date   CHOL 170 09/12/2014   CHOL 167 07/15/2013   CHOL 167 06/26/2012   Lab Results  Component Value Date   HDL 41.10 09/12/2014   HDL 39.20 07/15/2013   HDL 39.70 06/26/2012   Lab Results  Component Value Date   LDLCALC 105 (H) 09/12/2014   LDLCALC 109 (H) 07/15/2013   LDLCALC 107 (H) 06/26/2012   Lab Results  Component Value Date   TRIG 117.0 09/12/2014   TRIG 96.0 07/15/2013   TRIG 102.0 06/26/2012   Lab Results  Component Value Date   CHOLHDL 4 09/12/2014   CHOLHDL 4 07/15/2013   CHOLHDL 4 06/26/2012   Lab Results  Component Value Date   LDLDIRECT 118.9 08/23/2009      Radiology:  Dg Chest Port 1 View  Result Date: 03/10/2016 CLINICAL DATA:  Chest pain. EXAM: PORTABLE CHEST 1 VIEW COMPARISON:  03/05/2016 FINDINGS: Unchanged marked cardiomegaly. Unchanged generalized interstitial coarsening, likely chronic. No airspace consolidation. No effusions. Grossly intact appearances of the transvenous cardiac leads. IMPRESSION: Unchanged cardiomegaly and chronic appearing interstitial coarsening. No consolidation or effusion. Electronically Signed   By: Ellery Plunk M.D.   On: 03/10/2016 00:31    EKG:  NSR with LAFB and RBBB  ASSESSMENT/PLAN:   1  Ventricular  tachycardia with appropriate ATP which failed and successful cardioversion.  He is currently in NSR.  This may have been triggered by him stopping his O2.  ? Whether stopping his O2 in the setting of exertional activity and underlying ischemic disease in the LCx territory could have resulted in transient ischemia and VT.  He is intolerant of Amio due to prior amio induced lung toxicity.  He states that he cannot take Lidocaine due to severe hallucinations and shaking.  Will continue Mexilitene.  Continue BB as BP tolerates.  Check Mag level.  His wife gave him his dose of mexilitene in ER as he had not take his 11pm dose.  2.  Ischemic DCM s/p AICD (EF 25-30% b6 echo 11/2015) -   3.  Chronic systolic CHF - he does not appear to be in any significant volume overload at this time. BNP is minimally elevated at 165. He weighs himself daily and says that it has been stable.  He denies any recent LE edema.  In reviewing his device interrogation his optivol has been all over the chart but recently has not been consistent with volume overload.  Will continue ARB and BB as BP tolerates.  Continue Lasix.  Suspect SOB today was more related to stopping O2.  Cxray does not show CHF and exam is not c/w CHF.  He did get a liter of fluid in ER so will need to watch exam closely.  4.  ASCAD -  2 vessel obstructive CAD. Cath 09/2015 showed chronic total occlusion of the proximal RCA. There is moderate disease in the distal LCx and third OM. This is unchanged from 2004. There is new aneurysmal dilation of the distal left main - he has not had any anginal symptoms until today after exerting himself with is oxygen  off.  ? Whether he became hypoxic, and in the setting of CAD, became ischemic resulting in VT.  His cath was just done 09/28/2015.  May need a re-look cath to see if LCx system disease has progressed.  Will continue Ranexa.  He is not on ASA due to DOAC.  Continue BB as BP tolerates.  I suspect BP is low tonight due to  the SL NTG he took.  He was given a liter of saline in ER and SBP is now ..  5.  Persistent atrial fibrillation maintaining NSR.  EMS stated that he had some PAF in route to the hospital.  Continue Apixaban.  Cannot take Amio due to pulmonary toxicity.   Armanda Magic, MD  03/10/2016  1:07 AM

## 2016-03-10 NOTE — ED Notes (Signed)
Mayford Knife, MD paged with results of stat Trop I

## 2016-03-10 NOTE — ED Notes (Signed)
Cardiology at bedside.

## 2016-03-10 NOTE — Progress Notes (Signed)
Progress Note  Patient Name: WEYLAND VERVILLE Date of Encounter: 03/10/2016  Primary Cardiologist:    Dr. [name]  Subjective   No chest pain.  Ambulated in room.  Admitted earlier this morning by Dr. Mayford Knife  Inpatient Medications    Scheduled Meds: . apixaban  5 mg Oral BID  . carvedilol  3.125 mg Oral BID WC  . donepezil  10 mg Oral QHS  . famotidine  20 mg Oral Daily  . [START ON 03/11/2016] furosemide  20 mg Oral Q M,W,F  . guaiFENesin  600 mg Oral BID  . losartan  12.5 mg Oral Daily  . mexiletine  200 mg Oral Q8H  . ondansetron (ZOFRAN) IV  4 mg Intravenous Once  . ranolazine  500 mg Oral BID  . sodium chloride flush  3 mL Intravenous Q12H  . sodium chloride flush  3 mL Intravenous Q12H   Continuous Infusions:  PRN Meds: sodium chloride, ALPRAZolam, benzonatate, HYDROcodone-acetaminophen, loperamide, sodium chloride flush   Vital Signs    Vitals:   03/10/16 1230 03/10/16 1300 03/10/16 1400 03/10/16 1500  BP: 117/66 (!) 89/73 107/64 94/61  Pulse: (!) 59 61 61 63  Resp: (!) 34 15 (!) 22 19  Temp:      TempSrc:      SpO2: 99% 95% (!) 89% 100%  Height:        Intake/Output Summary (Last 24 hours) at 03/10/16 1622 Last data filed at 03/10/16 1300  Gross per 24 hour  Intake             1240 ml  Output              150 ml  Net             1090 ml   There were no vitals filed for this visit.  Telemetry    No arrhtyhmias - Personally Reviewed  ECG    NA - Personally Reviewed  Physical Exam   GEN: No acute distress.   Neck: No JVD Cardiac: RRR, no murmurs, rubs, or gallops.  Respiratory: Clear to auscultation bilaterally. GI: Soft, nontender, non-distended  MS: No edema; No deformity. Neuro:  Nonfocal  Psych: Normal affect   Labs    Chemistry Recent Labs Lab 03/10/16 0010 03/10/16 0403  NA 139 139  K 3.9 4.0  CL 103 106  CO2 23 26  GLUCOSE 132* 94  BUN 20 20  CREATININE 1.11 1.07  CALCIUM 8.5* 8.1*  PROT  --  5.1*  ALBUMIN  --  2.5*    AST  --  17  ALT  --  13*  ALKPHOS  --  44  BILITOT  --  0.9  GFRNONAA >60 >60  GFRAA >60 >60  ANIONGAP 13 7     Hematology Recent Labs Lab 03/10/16 0010 03/10/16 0403  WBC 10.2 9.2  RBC 4.01* 3.68*  HGB 10.0* 9.0*  HCT 33.1* 30.6*  MCV 82.5 83.2  MCH 24.9* 24.5*  MCHC 30.2 29.4*  RDW 16.8* 16.4*  PLT 374 348    Cardiac Enzymes Recent Labs Lab 03/10/16 0403 03/10/16 0945  TROPONINI 0.05* 0.05*    Recent Labs Lab 03/10/16 0023  TROPIPOC 0.01     BNP Recent Labs Lab 03/10/16 0004  BNP 165.6*     DDimer No results for input(s): DDIMER in the last 168 hours.   Radiology    Dg Chest Port 1 View  Result Date: 03/10/2016 CLINICAL DATA:  Chest pain. EXAM: PORTABLE  CHEST 1 VIEW COMPARISON:  03/05/2016 FINDINGS: Unchanged marked cardiomegaly. Unchanged generalized interstitial coarsening, likely chronic. No airspace consolidation. No effusions. Grossly intact appearances of the transvenous cardiac leads. IMPRESSION: Unchanged cardiomegaly and chronic appearing interstitial coarsening. No consolidation or effusion. Electronically Signed   By: Ellery Plunk M.D.   On: 03/10/2016 00:31    Cardiac Studies   NA  Patient Profile     This is a 71yo male with a history of an ischemic cardiomyopathy, chronic systolic heart failure class III, chronic atrial fibrillation, VT storm, amiodarone toxicity, recurrent VT this year requiring shocks now on Mexitil.  He presented to the ED with VT requiring shocking again.   Assessment & Plan    VENTRICULAR TACHYCARDIA:    No further events since admission.  EP to see in the AM.   CHRONIC SYSTOLIC HF:  Seems to be euvolemic.   Continue current therapy.   ASCAD:   Troponin is mildly elevated.   However, no other objective evidence of ischemia.    PERSISTENT ATRIAL FIB:  Was in NSR on admission.     Signed, Rollene Rotunda, MD  03/10/2016, 4:22 PM

## 2016-03-11 DIAGNOSIS — L8990 Pressure ulcer of unspecified site, unspecified stage: Secondary | ICD-10-CM | POA: Insufficient documentation

## 2016-03-11 LAB — BASIC METABOLIC PANEL
Anion gap: 8 (ref 5–15)
BUN: 11 mg/dL (ref 6–20)
CHLORIDE: 103 mmol/L (ref 101–111)
CO2: 27 mmol/L (ref 22–32)
Calcium: 8.4 mg/dL — ABNORMAL LOW (ref 8.9–10.3)
Creatinine, Ser: 0.96 mg/dL (ref 0.61–1.24)
GFR calc non Af Amer: >60 mL/min (ref >60)
Glucose, Bld: 95 mg/dL (ref 65–99)
POTASSIUM: 4.4 mmol/L (ref 3.5–5.1)
SODIUM: 138 mmol/L (ref 135–145)

## 2016-03-11 LAB — MAGNESIUM: MAGNESIUM: 1.7 mg/dL (ref 1.7–2.4)

## 2016-03-11 MED ORDER — QUINIDINE GLUCONATE ER 324 MG PO TBCR
324.0000 mg | EXTENDED_RELEASE_TABLET | Freq: Three times a day (TID) | ORAL | Status: DC
  Administered 2016-03-11 – 2016-03-13 (×7): 324 mg via ORAL
  Filled 2016-03-11 (×10): qty 1

## 2016-03-11 MED ORDER — GLYCERIN (LAXATIVE) 2.1 G RE SUPP
1.0000 | Freq: Once | RECTAL | Status: AC
  Administered 2016-03-11: 1 via RECTAL
  Filled 2016-03-11: qty 1

## 2016-03-11 MED ORDER — MAGNESIUM SULFATE 2 GM/50ML IV SOLN
2.0000 g | Freq: Once | INTRAVENOUS | Status: AC
  Administered 2016-03-11: 2 g via INTRAVENOUS
  Filled 2016-03-11: qty 50

## 2016-03-11 NOTE — Progress Notes (Signed)
SUBJECTIVE: The patient is doing well today.  At this time, he denies chest pain, shortness of breath, or any new concerns.  [name] apixaban  5 mg Oral BID  . carvedilol  3.125 mg Oral BID WC  . donepezil  10 mg Oral QHS  . famotidine  20 mg Oral Daily  . furosemide  20 mg Oral Q M,W,F  . guaiFENesin  600 mg Oral BID  . losartan  12.5 mg Oral Daily  . mexiletine  200 mg Oral Q8H  . ondansetron (ZOFRAN) IV  4 mg Intravenous Once  . ranolazine  500 mg Oral BID  . sodium chloride flush  3 mL Intravenous Q12H  . sodium chloride flush  3 mL Intravenous Q12H     OBJECTIVE: Physical Exam: Vitals:   03/11/16 0400 03/11/16 0500 03/11/16 0600 03/11/16 0800  BP: (!) 96/51 (!) 87/63 103/61   Pulse: 66 69 67   Resp: 18 (!) 22 (!) 27   Temp:    98.9 F (37.2 C)  TempSrc:    Oral  SpO2: 97% 93% 93%   Height:        Intake/Output Summary (Last 24 hours) at 03/11/16 0813 Last data filed at 03/11/16 0400  Gross per 24 hour  Intake              480 ml  Output              900 ml  Net             -420 ml    Telemetry is reviewed by myself, SR, one 4 beat NSVT  GEN- The patient is chronically ill appearing, alert and oriented x 3 today.   Head- normocephalic, atraumatic Eyes-  Sclera clear, conjunctiva pink Ears- hearing intact Oropharynx- clear Neck- supple, no JVP Lungs- CTA b/l normal work of breathing Heart- RRR, no significant murmurs, no rubs or gallops GI- soft, NT, ND Extremities- no clubbing, cyanosis, or edema Skin- no rash or lesion, possible pressure ulcer on backside Psych- euthymic mood, full affect Neuro- no gross deficits appreciated  LABS: Basic Metabolic Panel:  Recent Labs  40/98/11 0010 03/10/16 0403  NA 139 139  K 3.9 4.0  CL 103 106  CO2 23 26  GLUCOSE 132* 94  BUN 20 20  CREATININE 1.11 1.07  CALCIUM 8.5* 8.1*  MG  --  1.7  PHOS  --  4.4   Liver Function Tests:  Recent Labs  03/10/16 0403  AST 17  ALT 13*  ALKPHOS 44  BILITOT 0.9    PROT 5.1*  ALBUMIN 2.5*   No results for input(s): LIPASE, AMYLASE in the last 72 hours. CBC:  Recent Labs  03/10/16 0010 03/10/16 0403  WBC 10.2 9.2  NEUTROABS 6.1  --   HGB 10.0* 9.0*  HCT 33.1* 30.6*  MCV 82.5 83.2  PLT 374 348   Cardiac Enzymes:  Recent Labs  03/10/16 0403 03/10/16 0945 03/10/16 1553  TROPONINI 0.05* 0.05* 0.04*    RADIOLOGY: Dg Chest Port 1 View Result Date: 03/10/2016 CLINICAL DATA:  Chest pain. EXAM: PORTABLE CHEST 1 VIEW COMPARISON:  03/05/2016 FINDINGS: Unchanged marked cardiomegaly. Unchanged generalized interstitial coarsening, likely chronic. No airspace consolidation. No effusions. Grossly intact appearances of the transvenous cardiac leads. IMPRESSION: Unchanged cardiomegaly and chronic appearing interstitial coarsening. No consolidation or effusion. Electronically Signed   By: Ellery Plunk M.D.   On: 03/10/2016 00:31    ASSESSMENT AND PLAN:   1. VT  ICD interrogation reviewed, patient noted with at least 3 different VT's,  Noting ATP accelerated VT prior to shock     Recheck mag was 1.7 yesterday, will replace if need     Keep K+ 4.0 or > and Mag 2.0 or >     Hx of amiodarone pulm toxicity     On mexiletine 200mg  TID, will add Quinidine gluconate 324mg  Q8  No driving 6 months  2. ICM w/ICD  3. Chronic CHF (systolic)     Received IVF in ER, yesterday was essentially euvolemic/-465ml     CXR without effusion, OptiVol looks dry  4. CAD     No anginal complaints here or recently     Mild trop elevation felt to be secondary to VT +/- hypoxia off O2  5. Paroxysmal AFib     CHA2DS2Vasc is 4 on Eliquis  6. HTN     BP looks soft here, better this AM     Follow (off home meds)    Francis Dowse, PA-C 03/11/2016 8:13 AM  EP Attending  Patient seen and examined. Agree with above. The patient has been admitted with recurrent VT, prior history of amio lung tox, and denies anginal symptoms. Exam reveals a RRR and clear lungs and  no edema. Neuro is non-focal. Tele reveals NSR.  I have recommended we add quinidine gluconate to his mexilitine in hopes of gaining better control of his VT. We also discussed catheter ablation. Will watch in ICU today and transfer tomorrow if he is stable.  Leonia Reeves.D.

## 2016-03-12 LAB — MAGNESIUM: MAGNESIUM: 2.3 mg/dL (ref 1.7–2.4)

## 2016-03-12 MED ORDER — ONDANSETRON HCL 4 MG/2ML IJ SOLN
4.0000 mg | Freq: Once | INTRAMUSCULAR | Status: AC
  Administered 2016-03-12: 4 mg via INTRAVENOUS
  Filled 2016-03-12: qty 2

## 2016-03-12 MED ORDER — SIMETHICONE 80 MG PO CHEW: 80.0000 mg | CHEWABLE_TABLET | Freq: Every day | ORAL | Status: DC | PRN

## 2016-03-12 MED ORDER — FAMOTIDINE 20 MG PO TABS
20.0000 mg | ORAL_TABLET | Freq: Every day | ORAL | Status: DC | PRN
  Administered 2016-03-12: 20 mg via ORAL
  Filled 2016-03-12: qty 1

## 2016-03-12 NOTE — Progress Notes (Signed)
SUBJECTIVE: The patient is doing well today.  At this time, he denies chest pain, shortness of breath, or any new concerns.  [name] apixaban  5 mg Oral BID  . carvedilol  3.125 mg Oral BID WC  . donepezil  10 mg Oral QHS  . famotidine  20 mg Oral Daily  . furosemide  20 mg Oral Q M,W,F  . guaiFENesin  600 mg Oral BID  . losartan  12.5 mg Oral Daily  . mexiletine  200 mg Oral Q8H  . ondansetron (ZOFRAN) IV  4 mg Intravenous Once  . quiniDINE gluconate  324 mg Oral Q8H  . ranolazine  500 mg Oral BID  . sodium chloride flush  3 mL Intravenous Q12H  . sodium chloride flush  3 mL Intravenous Q12H     OBJECTIVE: Physical Exam: Vitals:   03/12/16 0400 03/12/16 0500 03/12/16 0600 03/12/16 0815  BP: 99/60 (!) 82/51 (!) 90/59   Pulse: (!) 59 (!) 59 (!) 58   Resp: 17 15 (!) 21   Temp:    97.5 F (36.4 C)  TempSrc:    Oral  SpO2: 96% 97% 96%   Height:        Intake/Output Summary (Last 24 hours) at 03/12/16 0816 Last data filed at 03/12/16 0100  Gross per 24 hour  Intake              653 ml  Output              650 ml  Net                3 ml    Telemetry is reviewed by myself, SR, no VT  GEN- The patient is chronically ill appearing, alert and oriented x 3 today.   Head- normocephalic, atraumatic Eyes-  Sclera clear, conjunctiva pink Ears- hearing intact Oropharynx- clear Neck- supple, no JVP Lungs- CTA b/l normal work of breathing Heart- RRR, no significant murmurs, no rubs or gallops GI- soft, NT, ND Extremities- no clubbing, cyanosis, or edema Skin- no rash or lesion, possible pressure ulcer on backside Psych- euthymic mood, full affect Neuro- no gross deficits appreciated  LABS: Basic Metabolic Panel:  Recent Labs  16/10/96 0403 03/11/16 0903 03/12/16 0406  NA 139 138  --   K 4.0 4.4  --   CL 106 103  --   CO2 26 27  --   GLUCOSE 94 95  --   BUN 20 11  --   CREATININE 1.07 0.96  --   CALCIUM 8.1* 8.4*  --   MG 1.7 1.7 2.3  PHOS 4.4  --   --     Liver Function Tests:  Recent Labs  03/10/16 0403  AST 17  ALT 13*  ALKPHOS 44  BILITOT 0.9  PROT 5.1*  ALBUMIN 2.5*   No results for input(s): LIPASE, AMYLASE in the last 72 hours. CBC:  Recent Labs  03/10/16 0010 03/10/16 0403  WBC 10.2 9.2  NEUTROABS 6.1  --   HGB 10.0* 9.0*  HCT 33.1* 30.6*  MCV 82.5 83.2  PLT 374 348   Cardiac Enzymes:  Recent Labs  03/10/16 0403 03/10/16 0945 03/10/16 1553  TROPONINI 0.05* 0.05* 0.04*    RADIOLOGY: Dg Chest Port 1 View Result Date: 03/10/2016 CLINICAL DATA:  Chest pain. EXAM: PORTABLE CHEST 1 VIEW COMPARISON:  03/05/2016 FINDINGS: Unchanged marked cardiomegaly. Unchanged generalized interstitial coarsening, likely chronic. No airspace consolidation. No effusions. Grossly intact appearances of the transvenous  cardiac leads. IMPRESSION: Unchanged cardiomegaly and chronic appearing interstitial coarsening. No consolidation or effusion. Electronically Signed   By: Ellery Plunk M.D.   On: 03/10/2016 00:31    ASSESSMENT AND PLAN:   1. VT w/shocks     ICD interrogation reviewed, patient noted with at least 3 different VT's,  Noting ATP accelerated VT prior to shock     Keep K+ 4.0 or > and Mag 2.0 or >     Hx of amiodarone pulm toxicity     On mexiletine 200mg  TID, Quinidine gluconate 324mg  Q8 added yesterday Tele and ECG look ok. No driving 6 months  2. ICM w/ICD  3. Chronic CHF (systolic)     Received IVF in ER, remains essentially euvolemic/-443ml, follow     CXR without effusion, OptiVol looks dry     No symptoms of fluid OL  4. CAD     No anginal complaints here or recently     Mild trop elevation felt to be secondary to VT +/- hypoxia off O2  5. Paroxysmal AFib     CHA2DS2Vasc is 4 on Eliquis  6. HTN     BP remains a little soft here, stable     Follow (off home meds)   Transfer to telemetry today, possible discharge tomorrow if remains w/o VT   Francis Dowse, PA-C 03/12/2016 8:16 AM  EP  Attending  Patient seen and examined. Agree with above. He has had no VT. He appears to be tolerating his Mexitil and Quinaglute. He will transfer out of ICU to a tele bed. Will plan to let him go home tomorrow if no additional VT.  Leonia Reeves.D.

## 2016-03-12 NOTE — Progress Notes (Addendum)
Pt vomited once. MD called back, order ondansetron IV medication. Verbal order. Will continue to monitor.   Carolene Gitto, RN

## 2016-03-12 NOTE — Progress Notes (Signed)
Paged MD regarding pt complaining of indigestion, MD stated she would order medication Pepcid and a PRN for relief. Will continue to monitor  [name]

## 2016-03-12 NOTE — Progress Notes (Signed)
Patient educated about safety and bed alarm during the night, however pt refused to be on bed alarm, stated he will call when assistance is needed. Will continue to monitor.   Wyndell Cardiff, RN

## 2016-03-12 NOTE — Care Management Note (Addendum)
Case Management Note  Patient Details  Name: [name] MRN: [id number] Date of Birth: 1945/03/18  Subjective/Objective:        Adm w chf, vt            Action/Plan: lives w fam, pcp dr Para March   Expected Discharge Date:                  Expected Discharge Plan:  Home/Self Care  In-House Referral:     Discharge planning Services     Post Acute Care Choice:    Choice offered to:     DME Arranged:    DME Agency:     HH Arranged:    HH Agency:     Status of Service:  In process, will continue to follow  If discussed at Long Length of Stay Meetings, dates discussed:    Additional Comments: no dc needs anticipated.  Hanley Hays, RN 03/12/2016, 10:09 AM

## 2016-03-13 ENCOUNTER — Other Ambulatory Visit: Payer: Self-pay | Admitting: Physician Assistant

## 2016-03-13 ENCOUNTER — Telehealth: Payer: Self-pay

## 2016-03-13 ENCOUNTER — Telehealth: Payer: Self-pay | Admitting: Internal Medicine

## 2016-03-13 DIAGNOSIS — I472 Ventricular tachycardia, unspecified: Secondary | ICD-10-CM

## 2016-03-13 MED ORDER — QUINIDINE GLUCONATE ER 324 MG PO TBCR: 324.0000 mg | EXTENDED_RELEASE_TABLET | Freq: Three times a day (TID) | ORAL | 3 refills | Status: DC

## 2016-03-13 MED ORDER — NITROGLYCERIN 0.4 MG SL SUBL: 0.4000 mg | SUBLINGUAL_TABLET | SUBLINGUAL | 1 refills | Status: DC | PRN

## 2016-03-13 NOTE — Discharge Instructions (Signed)
NO DRIVING FOR 6 MONTHS Information on my medicine - ELIQUIS (apixaban)   Why was Eliquis prescribed for you? Eliquis was prescribed for you to reduce the risk of forming blood clots that can cause a stroke if you have a medical condition called atrial fibrillation (a type of irregular heartbeat) OR to reduce the risk of a blood clots forming after orthopedic surgery.  What do You need to know about Eliquis ? Take your Eliquis TWICE DAILY - one tablet in the morning and one tablet in the evening with or without food.  It would be best to take the doses about the same time each day.  If you have difficulty swallowing the tablet whole please discuss with your pharmacist how to take the medication safely.  Take Eliquis exactly as prescribed by your doctor and DO NOT stop taking Eliquis without talking to the doctor who prescribed the medication.  Stopping may increase your risk of developing a new clot or stroke.  Refill your prescription before you run out.  After discharge, you should have regular check-up appointments with your healthcare provider that is prescribing your Eliquis.  In the future your dose may need to be changed if your kidney function or weight changes by a significant amount or as you get older.  What do you do if you miss a dose? If you miss a dose, take it as soon as you remember on the same day and resume taking twice daily.  Do not take more than one dose of ELIQUIS at the same time.  Important Safety Information A possible side effect of Eliquis is bleeding. You should call your healthcare provider right away if you experience any of the following: ? Bleeding from an injury or your nose that does not stop. ? Unusual colored urine (red or dark brown) or unusual colored stools (red or black). ? Unusual bruising for unknown reasons. ? A serious fall or if you hit your head (even if there is no bleeding).  Some medicines may interact with Eliquis and might  increase your risk of bleeding or clotting while on Eliquis. To help avoid this, consult your healthcare provider or pharmacist prior to using any new prescription or non-prescription medications, including herbals, vitamins, non-steroidal anti-inflammatory drugs (NSAIDs) and supplements.  This website has more information on Eliquis (apixaban): [email or web address].com.cy.

## 2016-03-13 NOTE — Discharge Summary (Signed)
DISCHARGE SUMMARY    Patient ID: [name],  MRN: [id number], DOB/AGE: 1945-08-18 71 y.o.  Admit date: 03/09/2016 Discharge date: 03/13/2016  Primary Care Physician: Crawford Givens, MD  Primary Cardiologist/Electrophysiologist: Dr. [name]  Primary Discharge Diagnosis:  1. VT with approprpriate ICD shocks     ICD interrogation noted 3 different VT's  Secondary Discharge Diagnosis:  1. ICM, chronic CHF (systolic)     Compensated 2. CAD     Stable without c/o CP 3. Paroxysmal AFib     CHA2DS2Vasc is 4 on Eliquis 4. HTN     Relative hypotension, stable  Allergies  Allergen Reactions  . Ace Inhibitors Other (See Comments)    muscle pain. Tolerates ARBs.   . Codeine Other (See Comments)    "head wants to explode."  . Doxycycline Diarrhea and Nausea And Vomiting  . Kionex [Sodium Polystyrene Sulfonate] Other (See Comments)    SOB, pressure in chest, weakness fatigue.   [name] Penicillins Swelling    "started at point of injection; w/in 3 min my upper arm was swollen 3 times normal" Has patient had a PCN reaction causing immediate rash, facial/tongue/throat swelling, SOB or lightheadedness with hypotension: Yes Has patient had a PCN reaction causing severe rash involving mucus membranes or skin necrosis: No Has patient had a PCN reaction that required hospitalization No Has patient had a PCN reaction occurring within the last 10 years: No If all of the above answers are "NO", then may proceed wi  . Lisinopril Other (See Comments)    Muscle Pain  . Statins Other (See Comments)    Myalgias per patient  . Lidocaine     Hallucinations, jerking     Procedures This Admission:  None  Brief HPI: [name] is a 71 y.o. male was admitted to [facility] 03/09/16 after receiving ICD shocks, interrogation noted these to be appropriate for VT.  Hospital Course:  The patient was admitted to ICU  , he is known to have developed amiodarone lung toxicity and hallucinations with  lidocaine,  his home mexiletine/medicines continued and EP consulted.  He was noted to have had cath with significant but stable CAD in August with a prior VT storm and no further ischemic w/u was pursued.  Throughout his stay, he had no c/o CP.  He was seen by EP, Dr. [name] and Quinidine Gluconate was started along with his mexiletine.  EKG done after initiation was reviewed by Dr. [name] and felt stable to continue.  He was monitored in ICU an additional day without VT moved to telemetry.  His telemetry monitored throughout and was without recurrent V arrhythmias.  He had an episode of vomiting associated with a coughing fit.  He is tolerating the medicines well. His BP appears at his baseline.  The patient was encouraged to use his home O2, noting he has been trying (as instructed) to wean off and was without it when this event occurred, uncertain if this contributed.  He was also encouraged to ambulate and stay active though listening to his body's cues and limit to his exertional tolerance. The patient was examined by Dr. [name] and considered stable for discharge to home. He has been ordered/arranged to have an EKG in 1 week and early follow with Dr. [name] scheduled.  The patient's discharge medications include an ARB (losartan) and beta blocker (carvedilol).   Physical Exam: Vitals:   03/13/16 0323 03/13/16 0644 03/13/16 0800 03/13/16 0820  BP:  (!) 105/55 99/66 99/66  Pulse:  (!) 57 (!) 58 (!) 58  Resp:  18    Temp:  97.7 F (36.5 C)    TempSrc:  Oral    SpO2:  94%    Weight: 137 lb 3.2 oz (62.2 kg)     Height:        GEN- The patient is in NAD, well appearing, alert and oriented x 3 today.   HEENT: normocephalic, atraumatic; sclera clear, conjunctiva pink; hearing intact; oropharynx clear Lungs- Clear to ausculation bilaterally, normal work of breathing.  No wheezes, rales, rhonchi Heart- Regular rate and rhythm, no murmurs, rubs or gallops, PMI not laterally displaced GI- soft,  non-tender, non-distended, bowel sounds present Extremities- no clubbing, cyanosis, or edema MS- no significant deformity or atrophy Skin- warm and dry, no rash or lesion Psych- euthymic mood, full affect Neuro- no gross defecits  Labs:   Lab Results  Component Value Date   WBC 9.2 03/10/2016   HGB 9.0 (L) 03/10/2016   HCT 30.6 (L) 03/10/2016   MCV 83.2 03/10/2016   PLT 348 03/10/2016    Recent Labs Lab 03/10/16 0403 03/11/16 0903  NA 139 138  K 4.0 4.4  CL 106 103  CO2 26 27  BUN 20 11  CREATININE 1.07 0.96  CALCIUM 8.1* 8.4*  PROT 5.1*  --   BILITOT 0.9  --   ALKPHOS 44  --   ALT 13*  --   AST 17  --   GLUCOSE 94 95    Discharge Medications:  Allergies as of 03/13/2016      Reactions   Ace Inhibitors Other (See Comments)   muscle pain. Tolerates ARBs.    Codeine Other (See Comments)   "head wants to explode."   Doxycycline Diarrhea, Nausea And Vomiting   Kionex [sodium Polystyrene Sulfonate] Other (See Comments)   SOB, pressure in chest, weakness fatigue.   Penicillins Swelling   "started at point of injection; w/in 3 min my upper arm was swollen 3 times normal" Has patient had a PCN reaction causing immediate rash, facial/tongue/throat swelling, SOB or lightheadedness with hypotension: Yes Has patient had a PCN reaction causing severe rash involving mucus membranes or skin necrosis: No Has patient had a PCN reaction that required hospitalization No Has patient had a PCN reaction occurring within the last 10 years: No If all of the above answers are "NO", then may proceed wi   Lisinopril Other (See Comments)   Muscle Pain   Statins Other (See Comments)   Myalgias per patient   Lidocaine    Hallucinations, jerking      Medication List    TAKE these medications   acetaminophen 325 MG tablet Commonly known as:  TYLENOL Take 2 tablets (650 mg total) by mouth every 6 (six) hours as needed for mild pain (or Fever >/= 101).   ALPRAZolam 0.25 MG  tablet Commonly known as:  XANAX TAKE 1 TABLET BY MOUTH 3 TIMES DAILY AS NEEDED FOR ANXIETY OR SLEEP   apixaban 5 MG Tabs tablet Commonly known as:  ELIQUIS Take 1 tablet (5 mg total) by mouth 2 (two) times daily.   benzonatate 200 MG capsule Commonly known as:  TESSALON Take 1 capsule (200 mg total) by mouth every 4 (four) hours as needed for cough.   carvedilol 3.125 MG tablet Commonly known as:  COREG Take 1 tablet (3.125 mg total) by mouth 2 (two) times daily with a meal.   CORICIDIN HBP COUGH/COLD PO Take 2 capsules by mouth  daily as needed (cold).   donepezil 10 MG tablet Commonly known as:  ARICEPT TAKE 1 TABLET (10 MG TOTAL) BY MOUTH AT BEDTIME.   furosemide 20 MG tablet Commonly known as:  LASIX TAKE 1 TABLET (20 MG TOTAL) BY MOUTH EVERY MONDAY, WEDNESDAY, AND FRIDAY.   guaiFENesin 600 MG 12 hr tablet Commonly known as:  MUCINEX Take 600 mg by mouth 2 (two) times daily.   HYDROcodone-acetaminophen 5-325 MG tablet Commonly known as:  NORCO/VICODIN Take 1 tablet by mouth every 6 (six) hours as needed for moderate pain.   loperamide 2 MG capsule Commonly known as:  IMODIUM Take 1 capsule (2 mg total) by mouth as needed for diarrhea or loose stools. What changed:  how much to take  when to take this  additional instructions   losartan 25 MG tablet Commonly known as:  COZAAR Take 0.5 tablets (12.5 mg total) by mouth daily.   metaxalone 800 MG tablet Commonly known as:  SKELAXIN Take 1 tablet (800 mg total) by mouth daily as needed for muscle spasms.   mexiletine 200 MG capsule Commonly known as:  MEXITIL Take 1 capsule (200 mg total) by mouth every 8 (eight) hours.   nitroGLYCERIN 0.4 MG SL tablet Commonly known as:  NITROSTAT PLACE 1 TABLET (0.4 MG TOTAL) UNDER THE TONGUE EVERY 5 (FIVE) MINUTES AS NEEDED. FOR CHEST PAIN.   promethazine 25 MG tablet Commonly known as:  PHENERGAN Take 25 mg by mouth every 6 (six) hours as needed for nausea or  vomiting.   quiniDINE gluconate 324 MG CR tablet Take 1 tablet (324 mg total) by mouth 3 (three) times daily.   RANEXA 500 MG 12 hr tablet Generic drug:  ranolazine TAKE 1 TABLET (500 MG TOTAL) BY MOUTH 2 (TWO) TIMES DAILY.   ranitidine 150 MG capsule Commonly known as:  ZANTAC Take 150 mg by mouth daily as needed for heartburn.       Disposition: Home   Discharge Instructions    Diet - low sodium heart healthy    Complete by:  As directed    Increase activity slowly    Complete by:  As directed      Follow-up Information    [facility] Office Follow up on 03/20/2016.   Specialty:  Cardiology Why:  EKG, nursing visit only Contact information: [address], [address] [place] [address] [phone]       Lewayne Bunting, MD Follow up on 04/05/2016.   Specialty:  Cardiology Why:  3:15PM Contact information: [address] [place] [address] [phone]           Duration of Discharge Encounter: Greater than 30 minutes including physician time.  Norma Fredrickson, PA-C 03/13/2016 8:56 AM  EP Attending  Patient seen and examined. Agree with above. He is stable this morning and tele looks good with no sustained arrhythmias. Vitals are stable. Followup as scheduled above.  Leonia Reeves.D.

## 2016-03-13 NOTE — Telephone Encounter (Signed)
New message      Pt is being discharged today.  Calling to see if Dr [name] will be following the home health orders for nursing.  Please call

## 2016-03-13 NOTE — Care Management Note (Signed)
Case Management Note  Patient Details  Name: [name] MRN: [id number] Date of Birth: 1945/10/14  Subjective/Objective:     Admitted with CHF               Action/Plan: Patient could benefit from a Disease Management Program for CHF; HHC choice offered, patient and spouse chose [facility]; Eber Jones with [facility] called for arrangements.  Expected Discharge Date:  03/13/16               Expected Discharge Plan:  Home w Home Health Services  In-House Referral:   Patient does not want [facility] calling him at this time  Discharge planning Services  CM Consult  HH Arranged:  RN, Disease Management HH Agency:  [facility]  Status of Service:  In process, will continue to follow  Reola Mosher [phone] 03/13/2016, 11:02 AM

## 2016-03-13 NOTE — Progress Notes (Signed)
Patient alert and oriented. Slept during the night. IV PRN ondansetron given and effective. Will continue to monitor.  Chaylee Ehrsam, RN

## 2016-03-13 NOTE — Progress Notes (Signed)
Reviewed all discharge instructions with patient and his wife.  Both stated understanding.  Prescriptions for Quinidine PO and Ntg Sl called into his pharmacist @ [facility].  No voiced complaints. Wheelchair escort to car transport home.

## 2016-03-13 NOTE — Telephone Encounter (Signed)
[facility]; pt was prescribed quinidine by Merita Norton PA and there is possible interaction between quinidine and donepezil. [facility] to [facility].

## 2016-03-13 NOTE — Consult Note (Signed)
   [facility] CM Inpatient Consult   03/13/2016  SENG POLISHCHUK [birth year] [id number]   Chart reviewed and follow up for Medicare ACO Registry for restart of services.  Patient has had 6 hospitalizations in the past 6 months. Chart reviewed and noted per inpatient RNCM notes that the patient continues to decline services from [facility] Care Management.  [facility] will not follow at this time as the patient has declined phone calls.  Spoke with inpatient RNCM, Steward Drone regarding declining patient for services with [facility] Care Management.  For questions, please contact:   Charlesetta Shanks, RN BSN CCM [facility]  [phone] business mobile phone Toll free office [phone]

## 2016-03-13 NOTE — Telephone Encounter (Signed)
Follow Up   Pt wife calling back to follow up with nurse. Requesting a call back.

## 2016-03-13 NOTE — Telephone Encounter (Signed)
M[facility] from [facility] left a msg on the refill vm stating that there is a major interaction between the quinidine and donepezil. Please advise. Thanks, MI

## 2016-03-14 ENCOUNTER — Telehealth: Payer: Self-pay

## 2016-03-14 DIAGNOSIS — Z955 Presence of coronary angioplasty implant and graft: Secondary | ICD-10-CM | POA: Diagnosis not present

## 2016-03-14 DIAGNOSIS — G4700 Insomnia, unspecified: Secondary | ICD-10-CM | POA: Diagnosis not present

## 2016-03-14 DIAGNOSIS — Z9181 History of falling: Secondary | ICD-10-CM | POA: Diagnosis not present

## 2016-03-14 DIAGNOSIS — E440 Moderate protein-calorie malnutrition: Secondary | ICD-10-CM | POA: Diagnosis not present

## 2016-03-14 DIAGNOSIS — Z8701 Personal history of pneumonia (recurrent): Secondary | ICD-10-CM | POA: Diagnosis not present

## 2016-03-14 DIAGNOSIS — I2510 Atherosclerotic heart disease of native coronary artery without angina pectoris: Secondary | ICD-10-CM | POA: Diagnosis not present

## 2016-03-14 DIAGNOSIS — Z87891 Personal history of nicotine dependence: Secondary | ICD-10-CM | POA: Diagnosis not present

## 2016-03-14 DIAGNOSIS — J449 Chronic obstructive pulmonary disease, unspecified: Secondary | ICD-10-CM | POA: Diagnosis not present

## 2016-03-14 DIAGNOSIS — Z6822 Body mass index (BMI) 22.0-22.9, adult: Secondary | ICD-10-CM | POA: Diagnosis not present

## 2016-03-14 DIAGNOSIS — J702 Acute drug-induced interstitial lung disorders: Secondary | ICD-10-CM | POA: Diagnosis not present

## 2016-03-14 DIAGNOSIS — I5022 Chronic systolic (congestive) heart failure: Secondary | ICD-10-CM | POA: Diagnosis not present

## 2016-03-14 DIAGNOSIS — F419 Anxiety disorder, unspecified: Secondary | ICD-10-CM | POA: Diagnosis not present

## 2016-03-14 DIAGNOSIS — I480 Paroxysmal atrial fibrillation: Secondary | ICD-10-CM | POA: Diagnosis not present

## 2016-03-14 DIAGNOSIS — Z7901 Long term (current) use of anticoagulants: Secondary | ICD-10-CM | POA: Diagnosis not present

## 2016-03-14 DIAGNOSIS — Z95810 Presence of automatic (implantable) cardiac defibrillator: Secondary | ICD-10-CM | POA: Diagnosis not present

## 2016-03-14 DIAGNOSIS — T462X5S Adverse effect of other antidysrhythmic drugs, sequela: Secondary | ICD-10-CM | POA: Diagnosis not present

## 2016-03-14 DIAGNOSIS — Z9981 Dependence on supplemental oxygen: Secondary | ICD-10-CM | POA: Diagnosis not present

## 2016-03-14 DIAGNOSIS — R413 Other amnesia: Secondary | ICD-10-CM | POA: Diagnosis not present

## 2016-03-14 DIAGNOSIS — I110 Hypertensive heart disease with heart failure: Secondary | ICD-10-CM | POA: Diagnosis not present

## 2016-03-14 DIAGNOSIS — L89151 Pressure ulcer of sacral region, stage 1: Secondary | ICD-10-CM | POA: Diagnosis not present

## 2016-03-14 DIAGNOSIS — I252 Old myocardial infarction: Secondary | ICD-10-CM | POA: Diagnosis not present

## 2016-03-14 NOTE — Telephone Encounter (Signed)
Called, pt unavailable. Left message for pt to call our office asap r/t possible drug interaction.

## 2016-03-14 NOTE — Telephone Encounter (Signed)
Called, spoke with Kenney Houseman, nurse with [facility]. Informed Dr. [name] stated to follow orders from hospital d/c instructions- d/c date 03/13/16. Dr. [name] verbally mentioned pt has small wound on backside. Reviewed pt's upcoming appts at our office on 03/19/16 (for EKG) and 04/05/16 (Dr. [name])..  Informed about possible interaction between Aricept and Quinidine. Dr. [name] stated pt must take Quinidine. Pt should STOP Aricept. Tanya verbalized understanding and stated she would let the pt know.

## 2016-03-14 NOTE — Telephone Encounter (Addendum)
Called, spoke with pt. Informed to STOP taking Aricept and continue Quinidine, due to possible drug interaction.  Pt stated he had nausea, vomiting, diarrhea in the hospital after starting Quinidine. Pt stated he is no longer experiencing the nausea, vomiting, diarrhea, but he is experiencing no appetite and pt feels weak. Pt stated he is also experience leg cramps. Informed pt needs to make sure he stays well hydrated. Will forward to Dr. [name] to advise.

## 2016-03-14 NOTE — Telephone Encounter (Signed)
Pt left message on triage phone requesting call back re: interaction of quinidine and donepezil.  Called pt back and explained that [facility] had [facility] yesterday and our triage nurse had transferred the call to Cardiology to respond to their concerns.  Asked patient to call back if he had additional questions. [phone]

## 2016-03-14 NOTE — Telephone Encounter (Signed)
Agree, defer to cards.  Thanks.

## 2016-03-14 NOTE — Telephone Encounter (Signed)
Follow Up:    [name] calling again to see if Dr [name] will be the doctor following his care for home health.

## 2016-03-15 ENCOUNTER — Other Ambulatory Visit: Payer: Self-pay | Admitting: Internal Medicine

## 2016-03-15 ENCOUNTER — Other Ambulatory Visit: Payer: Self-pay

## 2016-03-15 MED ORDER — METOPROLOL SUCCINATE ER 25 MG PO TB24: 25.0000 mg | ORAL_TABLET | Freq: Every day | ORAL | 4 refills | Status: DC

## 2016-03-15 NOTE — Telephone Encounter (Signed)
Called, pt unavailable. Left voice message to call back.  

## 2016-03-15 NOTE — Patient Outreach (Signed)
[facility]) Care Management  03/15/2016  [name] 29-Oct-1945 [id number]     EMMI-HF RED ON EMMI ALERT Day # 1 Date: 03/14/16 Red Alert Reason: "Scheduled a follow up appt? No"    Incoming call from patient. RN CM indentified self and agency name. Patient asked RN CM to repeat info and when RN CM repeated patient hung up the phone.       Plan: RN CM will make outreach attempt to patient within one business day.   Antionette Fairy, RN,BSN,CCM [facility] Care Management Telephonic Care Management Coordinator Direct Phone: [phone] Toll Free: [phone] Fax: [phone]

## 2016-03-15 NOTE — Telephone Encounter (Signed)
Called, pt unavailable. Left voice message for pt to call regarding medication change.

## 2016-03-15 NOTE — Patient Outreach (Signed)
[facility]) Care Management  03/15/2016  ERVING HON [birth year] [id number]       EMMI-HF RED ON EMMI ALERT Day # 1 Date: 03/14/16 Red Alert Reason: "Scheduled a follow up appt? No"    Outreach attempt #1 to patient. No answer at present. RN CM left HIPAA compliant voicemail message along with contact info.      Plan: RN CM will make outreach attempt to patient within one business day if no return call from patient.  Antionette Fairy, RN,BSN,CCM [facility] Care Management Telephonic Care Management Coordinator Direct Phone: [phone] Toll Free: [phone] Fax: [phone]

## 2016-03-15 NOTE — Telephone Encounter (Signed)
Received incoming call from both pt and wife. Informed pt should STOP Quinidine and Carvedilol today. START Toprol XL 25 mg daily tomorrow. [facility] Informed pt to call our office if symptoms do not improve or get worse. Pt verbalized understanding.

## 2016-03-15 NOTE — Telephone Encounter (Signed)
Follow up    Please call he is returning your call

## 2016-03-18 ENCOUNTER — Telehealth: Payer: Self-pay

## 2016-03-18 ENCOUNTER — Ambulatory Visit (INDEPENDENT_AMBULATORY_CARE_PROVIDER_SITE_OTHER): Payer: Medicare Other | Admitting: Family Medicine

## 2016-03-18 ENCOUNTER — Other Ambulatory Visit: Payer: Self-pay

## 2016-03-18 ENCOUNTER — Encounter: Payer: Self-pay | Admitting: Family Medicine

## 2016-03-18 VITALS — BP 100/60 | HR 56 | Temp 98.2°F | Wt 144.0 lb

## 2016-03-18 DIAGNOSIS — I472 Ventricular tachycardia, unspecified: Secondary | ICD-10-CM

## 2016-03-18 NOTE — Patient Instructions (Signed)
Go to the lab on the way out.  We'll contact you with your lab report. Don't change your meds for now.  Take care.  Glad to see you.  

## 2016-03-18 NOTE — Progress Notes (Signed)
Pre visit review using our clinic review tool, if applicable. No additional management support is needed unless otherwise documented below in the visit note. 

## 2016-03-18 NOTE — Telephone Encounter (Signed)
PLEASE NOTE: All timestamps contained within this report are represented as [place] Standard Time. CONFIDENTIALTY NOTICE: This fax transmission is intended only for the addressee. It contains information that is legally privileged, confidential or otherwise protected from use or disclosure. If you are not the intended recipient, you are strictly prohibited from reviewing, disclosing, copying using or disseminating any of this information or taking any action in reliance on or regarding this information. If you have received this fax in error, please notify us immediately by telephone so that we can arrange for its return to [place]. Phone: [phone], Toll-Free: [phone], Fax: [phone] Page: 1 of 2 Call Id: [id number] [facility] Night - Client TELEPHONE ADVICE RECORD [facility] Patient Name: [name] Gender: Male DOB: 10/05/1945 Age: 71 Y 9 M 16 D Return Phone Number: [phone] (Primary), [phone] (Secondary) City/State/Zip: [place] Client [facility] Night - Client Client Site [facility] [place] - Night Physician Raechel Ache - MD Who Is Calling Patient / Member / Family / Caregiver Call Type Triage / Clinical Caller Name Abhay Relationship To Patient Self Return Phone Number [phone] (Primary) Chief Complaint OVERDOSE took too much medication at once Reason for Call Symptomatic / Request for Health Information Initial Comment Caller states he has AM and PM medication to take every night. Took the wrong medication. Nexitil and Metoprolol 70ml and Losartan 12.75ml Nurse Assessment Nurse: Pollyann Savoy, RN, Melissa Date/Time (Eastern Time): 03/16/2016 8:26:33 PM Confirm and document reason for call. If symptomatic, describe symptoms. ---Caller states he has AM and PM medication to take every night. Took the wrong medication. Mexitil 200mg  and Metoprolol 25mg  and Losartan 12.5mg -took this am  and was suppose to take in am but accidentally taken 30 min ago. Takes Elaquiz 5mg , Mucinex 600mg , and Renexa 500mg -theses 3 po q hs-has not taken yet. States was to take Mexitil to 11pm tonightinstructed to not take at 11pm tonight since dose taken early. Does the PT have any chronic conditions? (i.e. diabetes, asthma, etc.) ---Yes List chronic conditions. ---HTN, A-fib, V-fib hx x 1, CHF. Guidelines Guideline Title Affirmed Question Poisoning All OTHER POTENTIALLY HARMFUL SUBSTANCES (e.g., nearly all chemicals, plants, more than a double dose of a drug) Disp. Time Lamount Cohen Time) Disposition Final User 03/16/2016 8:34:38 PM Call Poison Center Now Yes Zayas, RN, Melissa Care Advice Given Per Guideline CALL POISON CENTER NOW: You need to call the [facility] now. The phone number is (___) - ___-____. [employer] NATIONAL TOLL-FREE POISON CENTER NUMBER: * Phone number: [phone] * You will be connected automatically to your local poison center. CARE ADVICE given per Poisoning (Adult) guideline. PLEASE NOTE: All timestamps contained within this report are represented as [place] Standard Time. CONFIDENTIALTY NOTICE: This fax transmission is intended only for the addressee. It contains information that is legally privileged, confidential or otherwise protected from use or disclosure. If you are not the intended recipient, you are strictly prohibited from reviewing, disclosing, copying using or disseminating any of this information or taking any action in reliance on or regarding this information. If you have received this fax in error, please notify us immediately by telephone so that we can arrange for its return to [place]. Phone: [phone], Toll-Free: [phone], Fax: [phone] Page: 2 of 2 Call Id: [id number] Comments User: Susa Loffler, RN Date/Time Lamount Cohen Time): 03/16/2016 9:03:37 PM Returned call to pt and informed of all orders x 2-verbalizes understanding of all orders-to call  prn. Paging DoctorName Phone DateTime Action Result/Outcome  Notes Raechel Ache - MD [phone] 03/16/2016 8:48:27 PM Doctor Paged Called On Call Provider - Jeb Levering - MD 03/16/2016 8:50:27 PM Message Result Spoke with On Call - General Spoke with Dr Cipriano Bunker orders:1)Pt to skip am doses of Metoprolol and Losartan since accidentally took extra doses this evening-to resume these meds on Monday am 04-15-16 2)To take hs meds, Elaquis, Mucinex, and Renexa as previously ordered 3)If lightheadedness or dizziness occur, to be taken to ED

## 2016-03-18 NOTE — Progress Notes (Signed)
Admit date: 03/09/2016 Discharge date: 03/13/2016  Primary Care Physician: Crawford Givens, MD  Primary Cardiologist/Electrophysiologist: Dr. [name]  Primary Discharge Diagnosis:  1. VT with approprpriate ICD shocks     ICD interrogation noted 3 different VT's  Secondary Discharge Diagnosis:  1. ICM, chronic CHF (systolic)     Compensated 2. CAD     Stable without c/o CP 3. Paroxysmal AFib     CHA2DS2Vasc is 4 on Eliquis 4. HTN     Relative hypotension, stable       Allergies  Allergen Reactions  . Ace Inhibitors Other (See Comments)    muscle pain. Tolerates ARBs.   . Codeine Other (See Comments)    "head wants to explode."  . Doxycycline Diarrhea and Nausea And Vomiting  . Kionex [Sodium Polystyrene Sulfonate] Other (See Comments)    SOB, pressure in chest, weakness fatigue.   [name] Penicillins Swelling    "started at point of injection; w/in 3 min my upper arm was swollen 3 times normal" Has patient had a PCN reaction causing immediate rash, facial/tongue/throat swelling, SOB or lightheadedness with hypotension: Yes Has patient had a PCN reaction causing severe rash involving mucus membranes or skin necrosis: No Has patient had a PCN reaction that required hospitalization No Has patient had a PCN reaction occurring within the last 10 years: No If all of the above answers are "NO", then may proceed wi  . Lisinopril Other (See Comments)    Muscle Pain  . Statins Other (See Comments)    Myalgias per patient  . Lidocaine     Hallucinations, jerking     Procedures This Admission:  None  Brief HPI: [name] is a 71 y.o. male was admitted to [facility] 03/09/16 after receiving ICD shocks, interrogation noted these to be appropriate for VT.  Hospital Course:  The patient was admitted to ICU  , he is known to have developed amiodarone lung toxicity and hallucinations with lidocaine,  his home mexiletine/medicines continued and EP consulted.  He was noted  to have had cath with significant but stable CAD in August with a prior VT storm and no further ischemic w/u was pursued.  Throughout his stay, he had no c/o CP.  He was seen by EP, Dr. [name] and Quinidine Gluconate was started along with his mexiletine.  EKG done after initiation was reviewed by Dr. [name] and felt stable to continue.  He was monitored in ICU an additional day without VT moved to telemetry.  His telemetry monitored throughout and was without recurrent V arrhythmias.  He had an episode of vomiting associated with a coughing fit.  He is tolerating the medicines well. His BP appears at his baseline.  The patient was encouraged to use his home O2, noting he has been trying (as instructed) to wean off and was without it when this event occurred, uncertain if this contributed.  He was also encouraged to ambulate and stay active though listening to his body's cues and limit to his exertional tolerance. The patient was examined by Dr. [name] and considered stable for discharge to home. He has been ordered/arranged to have an EKG in 1 week and early follow with Dr. [name] scheduled.  The patient's discharge medications include an ARB (losartan) and beta blocker (carvedilol).   =======================================  Hospital follow-up. Admitted with ICD discharge. This was appropriate based on interrogation. Admitted, electrolytes were monitored, was on continuous cardiac monitoring. No more ICD discharges during admission. Medications adjusted as per list in EMR. Was  able to be discharged home. In the meantime no chest pain. Back on oxygen. Compliant with use. There was an error with his medications where he accidentally took his metoprolol and losartan at night instead of in the morning. See previous notes in EMR about this. He had no adverse effect with this early dose of metoprolol and losartan.  He is back on his routine dosing of medication. No more ICD discharges. He has follow-up for EKG  with cardiology tomorrow. He is back on anticoagulation with bruising on R arm, no overt bleeding.  PMH and SH reviewed  ROS: Per HPI unless specifically indicated in ROS section   Meds, vitals, and allergies reviewed.   GEN: nad, alert and pleasant in conversation, on O2 HEENT: mucous membranes moist NECK: supple w/o LA CV: rrr. PULM: ctab, no inc wob ABD: soft, +bs EXT: no edema SKIN: no acute rash, but senile ecchymoses noted on the right forearm, extensor side.

## 2016-03-18 NOTE — Patient Outreach (Signed)
[facility]) Care Management  03/18/2016  HRAG LITAKER 1945/07/11 [id number]   EMMI-HF RED ON EMMI ALERT Day # 1 Date: 03/14/16 Red Alert Reason: "Scheduled a follow up appt? No"    Outreach attempt #2 to patient. No answer.    Plan: RN CM will send unsuccessful outreach letter and close case if no response from patient within 10 business days.    Antionette Fairy, RN,BSN,CCM [facility] Care Management Telephonic Care Management Coordinator Direct Phone: [phone] Toll Free: [phone] Fax: [phone]

## 2016-03-19 ENCOUNTER — Ambulatory Visit (INDEPENDENT_AMBULATORY_CARE_PROVIDER_SITE_OTHER): Payer: Medicare Other

## 2016-03-19 DIAGNOSIS — I472 Ventricular tachycardia, unspecified: Secondary | ICD-10-CM

## 2016-03-19 DIAGNOSIS — Z79899 Other long term (current) drug therapy: Secondary | ICD-10-CM

## 2016-03-19 DIAGNOSIS — Z95810 Presence of automatic (implantable) cardiac defibrillator: Secondary | ICD-10-CM

## 2016-03-19 LAB — MAGNESIUM: Magnesium: 1.8 mg/dL (ref 1.5–2.5)

## 2016-03-19 LAB — CBC WITH DIFFERENTIAL/PLATELET
BASOS ABS: 0.1 10*3/uL (ref 0.0–0.1)
Basophils Relative: 0.7 % (ref 0.0–3.0)
EOS ABS: 0.6 10*3/uL (ref 0.0–0.7)
Eosinophils Relative: 4.6 % (ref 0.0–5.0)
HEMATOCRIT: 32.2 % — AB (ref 39.0–52.0)
Hemoglobin: 10.2 g/dL — ABNORMAL LOW (ref 13.0–17.0)
LYMPHS PCT: 19.3 % (ref 12.0–46.0)
Lymphs Abs: 2.5 10*3/uL (ref 0.7–4.0)
MCHC: 31.6 g/dL (ref 30.0–36.0)
MCV: 79.9 fl (ref 78.0–100.0)
Monocytes Absolute: 1.1 10*3/uL — ABNORMAL HIGH (ref 0.1–1.0)
Monocytes Relative: 8.7 % (ref 3.0–12.0)
NEUTROS ABS: 8.7 10*3/uL — AB (ref 1.4–7.7)
NEUTROS PCT: 66.7 % (ref 43.0–77.0)
PLATELETS: 431 10*3/uL — AB (ref 150.0–400.0)
RBC: 4.03 Mil/uL — AB (ref 4.22–5.81)
RDW: 17.8 % — ABNORMAL HIGH (ref 11.5–15.5)
WBC: 13.1 10*3/uL — ABNORMAL HIGH (ref 4.0–10.5)

## 2016-03-19 LAB — BASIC METABOLIC PANEL
BUN: 19 mg/dL (ref 6–23)
CHLORIDE: 103 meq/L (ref 96–112)
CO2: 29 mEq/L (ref 19–32)
Calcium: 8.5 mg/dL (ref 8.4–10.5)
Creatinine, Ser: 1.1 mg/dL (ref 0.40–1.50)
GFR: 69.98 mL/min (ref >60.00)
Glucose, Bld: 102 mg/dL — ABNORMAL HIGH (ref 70–99)
POTASSIUM: 4.5 meq/L (ref 3.5–5.1)
Sodium: 139 mEq/L (ref 135–145)

## 2016-03-19 NOTE — Assessment & Plan Note (Signed)
He has had no more ICD discharges in the meantime. He is not short of breath, but he is still on oxygen. No chest pain. Recheck electrolytes today. He has follow-up for EKG with cardiology tomorrow. No change in medications at this point. Plan discussed with patient. He agrees.

## 2016-03-19 NOTE — Patient Instructions (Addendum)
NURSE VISIT   1.) Reason for visit: EKG - medication management - Quinidine / Toprol XL   2.) Name of MD requesting visit: Dr. [name]   3.) H&P: Pt started Quinidine 03/09/16. Pt stopped Quinidine 03/15/16 due to loss of appetite and fatigue.   3.) ROS related to problem: Pt started Quinidine 03/09/16. Pt stopped Quinidine. Started Toprol XL 25 mg daily 03/16/16. Pt has been tolerating Toprol XL well.   4.) Assessment and plan per MD: DOD Dr. Elease Hashimoto reviewed EKG. Continue current medications and follow-up with Dr. [name].

## 2016-03-20 ENCOUNTER — Telehealth: Payer: Self-pay

## 2016-03-20 NOTE — Telephone Encounter (Deleted)
Called, left msg for pt to call back.

## 2016-03-20 NOTE — Telephone Encounter (Signed)
Called, pt unavailable. Left message to call back. 

## 2016-03-21 NOTE — Telephone Encounter (Signed)
Called, pt unavailable. Left detailed msg (ok on DPR). Informed pt okay to start back on Aricept. Dr. [name] recommended staying on Eliquis 5 mg. Pt is not a good canidate for lower dose Eliquis. Reminded of upcoming appt with Dr. [name] on 04/05/16. Requested call back with questions or concerns.

## 2016-04-01 ENCOUNTER — Other Ambulatory Visit: Payer: Self-pay | Admitting: Nurse Practitioner

## 2016-04-01 ENCOUNTER — Telehealth: Payer: Self-pay | Admitting: Family Medicine

## 2016-04-01 ENCOUNTER — Other Ambulatory Visit: Payer: Self-pay

## 2016-04-01 NOTE — Patient Outreach (Signed)
[facility]) Care Management  04/01/2016  DOMINION EVERSON 31-Jul-1945 [id number]   EMMI-HF RED ON EMMI ALERT Day # 1 Date: 03/14/16 Red Alert Reason: "Scheduled a follow up appt? No"   Multiple attempts to establish contact with patient with no success. No response from letter mailed to patient. Case is being closed at this time.     Plan: RN CM will notify [facility] administrative assistant of case status.   Antionette Fairy, RN,BSN,CCM [facility] Care Management Telephonic Care Management Coordinator Direct Phone: [phone] Toll Free: [phone] Fax: [phone]

## 2016-04-01 NOTE — Telephone Encounter (Signed)
Please hold losartan for another 3-4 days and update me about his BP then.  Thanks.

## 2016-04-01 NOTE — Telephone Encounter (Signed)
Pt left v/m; pt has been feeling light headed and BP has been running low; pt talked with pharmacist and pt stopped Losartan until contact PCP; pt monitoring BP twice a day averaging 104/67 and pt is feeling better. Pt wants to know how or if he should take the losartan. Pt last seen 03/18/16.

## 2016-04-01 NOTE — Telephone Encounter (Signed)
Patient notified as instructed by telephone and verbalized understanding. 

## 2016-04-05 ENCOUNTER — Ambulatory Visit (INDEPENDENT_AMBULATORY_CARE_PROVIDER_SITE_OTHER): Payer: Medicare Other | Admitting: Internal Medicine

## 2016-04-05 ENCOUNTER — Encounter: Payer: Self-pay | Admitting: Internal Medicine

## 2016-04-05 ENCOUNTER — Other Ambulatory Visit: Payer: Self-pay

## 2016-04-05 DIAGNOSIS — R001 Bradycardia, unspecified: Secondary | ICD-10-CM

## 2016-04-05 DIAGNOSIS — I482 Chronic atrial fibrillation, unspecified: Secondary | ICD-10-CM

## 2016-04-05 DIAGNOSIS — I5022 Chronic systolic (congestive) heart failure: Secondary | ICD-10-CM | POA: Diagnosis not present

## 2016-04-05 MED ORDER — FUROSEMIDE 20 MG PO TABS: 20.0000 mg | ORAL_TABLET | ORAL | 3 refills | Status: DC

## 2016-04-05 MED ORDER — METOPROLOL SUCCINATE ER 25 MG PO TB24: 25.0000 mg | ORAL_TABLET | Freq: Every day | ORAL | 3 refills | Status: DC

## 2016-04-05 MED ORDER — APIXABAN 2.5 MG PO TABS: 2.5000 mg | ORAL_TABLET | Freq: Two times a day (BID) | ORAL | 3 refills | Status: DC

## 2016-04-05 MED ORDER — MEXILETINE HCL 200 MG PO CAPS: 200.0000 mg | ORAL_CAPSULE | Freq: Three times a day (TID) | ORAL | 3 refills | Status: DC

## 2016-04-05 NOTE — Patient Instructions (Addendum)
Medication Instructions:  Your physician has recommended you make the following change in your medication:  1) DECREASE Eliquis to 2.5 mg twice daily   Labwork: None Ordered   Testing/Procedures: None Ordered   Follow-Up: Your physician wants you to follow-up in: 6 months with Dr. [name].  You will receive a reminder letter in the mail two months in advance. If you don't receive a letter, please call our office to schedule the follow-up appointment.  Remote monitoring is used to monitor your ICD from home. This monitoring reduces the number of office visits required to check your device to one time per year. It allows [place] to keep an eye on the functioning of your device to ensure it is working properly. You are scheduled for a device check from home on 07/08/16. You may send your transmission at any time that day. If you have a wireless device, the transmission will be sent automatically. After your physician reviews your transmission, you will receive a postcard with your next transmission date. .   Any Other Special Instructions Will Be Listed Below (If Applicable).     If you need a refill on your cardiac medications before your next appointment, please call your pharmacy.

## 2016-04-05 NOTE — Progress Notes (Signed)
HPI [name] returns today for followup. He is a 71 year old man with an ischemic cardiomyopathy, chronic systolic heart failure, class III, chronic atrial fibrillation, previously unwilling to take any anticoagulation, ventricular tachycardia, status post ICD implantation. He was in the hospital several months ago with VT storm but ultimately developed amio lung toxicity and had to be switched to mexitil. He is also on ranexa. He has been bothered by hypotension and had to have his losartan stopped. He denies chest pain. He does not have palpitations.  Allergies  Allergen Reactions  . Ace Inhibitors Other (See Comments)    muscle pain. Tolerates ARBs.   . Codeine Other (See Comments)    "head wants to explode."  . Doxycycline Diarrhea and Nausea And Vomiting  . Kionex [Sodium Polystyrene Sulfonate] Other (See Comments)    SOB, pressure in chest, weakness fatigue.   [name] Penicillins Swelling    "started at point of injection; w/in 3 min my upper arm was swollen 3 times normal" Has patient had a PCN reaction causing immediate rash, facial/tongue/throat swelling, SOB or lightheadedness with hypotension: Yes Has patient had a PCN reaction causing severe rash involving mucus membranes or skin necrosis: No Has patient had a PCN reaction that required hospitalization No Has patient had a PCN reaction occurring within the last 10 years: No If all of the above answers are "NO", then may proceed wi  . Lisinopril Other (See Comments)    Muscle Pain  . Statins Other (See Comments)    Myalgias per patient  . Lidocaine     Hallucinations, jerking  . Pacerone [Amiodarone] Other (See Comments)    Lung and heart problem     Current Outpatient Prescriptions  Medication Sig Dispense Refill  . acetaminophen (TYLENOL) 325 MG tablet Take 2 tablets (650 mg total) by mouth every 6 (six) hours as needed for mild pain (or Fever >/= 101).    [name] ALPRAZolam (XANAX) 0.25 MG tablet TAKE 1 TABLET BY MOUTH 3 TIMES  DAILY AS NEEDED FOR ANXIETY OR SLEEP 30 tablet 1  . benzonatate (TESSALON) 200 MG capsule Take 1 capsule (200 mg total) by mouth every 4 (four) hours as needed for cough. 20 capsule 0  . Chlorpheniramine-DM (CORICIDIN HBP COUGH/COLD PO) Take 2 capsules by mouth daily as needed (cold).     . furosemide (LASIX) 20 MG tablet Take 1 tablet (20 mg total) by mouth every Monday, Wednesday, and Friday. 45 tablet 3  . guaiFENesin (MUCINEX) 600 MG 12 hr tablet Take 600 mg by mouth 2 (two) times daily.     [name] HYDROcodone-acetaminophen (NORCO/VICODIN) 5-325 MG tablet Take 1 tablet by mouth every 6 (six) hours as needed for moderate pain.    [name] loperamide (IMODIUM) 2 MG capsule Take 1 capsule (2 mg total) by mouth as needed for diarrhea or loose stools. (Patient taking differently: Take 4 mg by mouth See admin instructions. Take 2 tablets after each bowl movement.) 30 capsule 0  . metaxalone (SKELAXIN) 800 MG tablet Take 1 tablet (800 mg total) by mouth daily as needed for muscle spasms. 30 tablet 1  . metoprolol succinate (TOPROL XL) 25 MG 24 hr tablet Take 1 tablet (25 mg total) by mouth daily. 90 tablet 3  . mexiletine (MEXITIL) 200 MG capsule Take 1 capsule (200 mg total) by mouth every 8 (eight) hours. 270 capsule 3  . nitroGLYCERIN (NITROSTAT) 0.4 MG SL tablet Place 1 tablet (0.4 mg total) under the tongue every 5 (five) minutes as needed for  chest pain. 25 tablet 1  . NON FORMULARY Oxygen 3 liters   24/7    . promethazine (PHENERGAN) 25 MG tablet Take 25 mg by mouth every 6 (six) hours as needed for nausea or vomiting.    [name] RANEXA 500 MG 12 hr tablet TAKE 1 TABLET (500 MG TOTAL) BY MOUTH 2 (TWO) TIMES DAILY. 180 tablet 3  . ranitidine (ZANTAC) 150 MG capsule Take 150 mg by mouth daily as needed for heartburn.     . Simethicone (GAS-X PO) Take by mouth as needed (Take as directed).     [name] apixaban (ELIQUIS) 2.5 MG TABS tablet Take 1 tablet (2.5 mg total) by mouth 2 (two) times daily. 180 tablet 3   No  current facility-administered medications for this visit.    Facility-Administered Medications Ordered in Other Visits  Medication Dose Route Frequency Provider Last Rate Last Dose  . sodium chloride 0.9 % injection 3 mL  3 mL Intravenous Q12H Randy Maw, MD      . sodium chloride 0.9 % injection 3 mL  3 mL Intravenous PRN Randy Maw, MD         Past Medical History:  Diagnosis Date  . Acute lower GI bleeding 12/11/2011   "first time" (12/11/2011)  . Acute myocardial infarction, unspecified site, episode of care unspecified 1995   Pt living in [place], no stent, ?PTCA  . AICD (automatic cardioverter/defibrillator) present   . Allergic rhinitis, cause unspecified   . Anxiety   . Arthritis    "all over" (11/07/2015)  . Atrial fibrillation (HCC)    a. 05/2015 - converted to sinus in setting of ICD shocks; placed on eliquis 5 bid.  [name] CAD (coronary artery disease), autologous vein bypass graft   . Cervical herniated disc    told not to lift >10 lbs  . Chronic systolic CHF (congestive heart failure), NYHA class 2 (HCC)    Reports EF of 25%.   [name] COPD (chronic obstructive pulmonary disease) (HCC) 11/2012   by xray  . Diverticulosis    by CT scan  . HCAP (healthcare-associated pneumonia) 11/06/2015  . Hypertension   . Insomnia   . Paroxysmal ventricular tachycardia (HCC)   . Perennial allergic rhinitis    only to dust mites  . Pneumonia 2000s   "walking pneumonia"  . VT (ventricular tachycardia) (HCC)    a. 05/2015 - VT storm with multiple ICD shocks-->Amio 400 BID.    ROS:   All systems reviewed and negative except as noted in the HPI.   Past Surgical History:  Procedure Laterality Date  . CARDIAC CATHETERIZATION  2004   LAD 30%, D1 30%, CFX-AV groove 70-80%, OM1 30%, EF 20-25%  . CARDIAC CATHETERIZATION N/A 09/28/2015   Procedure: Left Heart Cath and Coronary Angiography;  Surgeon: Peter M Swaziland, MD;  Location: [facility] INVASIVE CV LAB;  Service: Cardiovascular;   Laterality: N/A;  . CARDIAC DEFIBRILLATOR PLACEMENT  2004  . CATARACT EXTRACTION W/ INTRAOCULAR LENS IMPLANT Right 01/2012  . COLONOSCOPY  01/08/2012   Procedure: COLONOSCOPY;  Surgeon: Iva Boop, MD;  Location: [facility] ENDOSCOPY;  Service: Endoscopy;  Laterality: N/A;  . CORONARY ANGIOPLASTY  1995   Pt thinks he got a balloon, living in [place], [place]  . IMPLANTABLE CARDIOVERTER DEFIBRILLATOR GENERATOR CHANGE N/A 02/07/2012   Procedure: IMPLANTABLE CARDIOVERTER DEFIBRILLATOR GENERATOR CHANGE;  Surgeon: Randy Maw, MD; Medtronic Evera XT VR single-chamber serial number [id number], Laterality: Left  . INSERT / REPLACE / REMOVE PACEMAKER  2004   Medtronic ICD  . KNEE ARTHROSCOPY Left 05/2003   Hattie Perch 06/19/2010  . LAPAROSCOPIC CHOLECYSTECTOMY  1/ 2012  . SHOULDER ARTHROSCOPY W/ ROTATOR CUFF REPAIR Right twice  . TONSILLECTOMY AND ADENOIDECTOMY  ~ 1951     Family History  Problem Relation Age of Onset  . Diabetes Father   . Tracheal cancer Father 33    smoker  . Stroke Mother   . Cancer Sister     left eye  . CAD Neg Hx   . Colon cancer Neg Hx   . Prostate cancer Neg Hx      Social History   Social History  . Marital status: Married    Spouse name: N/A  . Number of children: 0  . Years of education: N/A   Occupational History  . [employer] truck driver (retired)    Social History Main Topics  . Smoking status: Former Smoker    Packs/day: 0.50    Years: 50.00    Types: Cigarettes, Cigars    Quit date: 09/27/2015  . Smokeless tobacco: Never Used  . Alcohol use No  . Drug use: No  . Sexual activity: Not Currently   Other Topics Concern  . Not on file   Social History Narrative   Lives with wife, married 1998   Grown children, 2 great grandchildren   Occupation: retired, was Presenter, broadcasting   Activity: walking, fishing   Diet: good water daily, fruits/vegetables rare      Wife is Product manager.    [id number], Human resources officer. No known agent orange exposure.       BP 116/68    Pulse 75   Ht 5\' 3"  (1.6 m)   Wt 145 lb (65.8 kg)   SpO2 94%   BMI 25.69 kg/m   Physical Exam:  Chronically ill appearing 71 yo man, NAD HEENT: Unremarkable Neck:  6 cm JVD, no thyromegally Lungs:  Clear except for rales in the bases. No wheezes or rhonchi. Well-healed ICD incision. HEART:  Regular brady rhythm, no murmurs, no rubs, no clicks Abd:  soft, positive bowel sounds, no organomegally, no rebound, no guarding Ext:  2 plus pulses, no edema, no cyanosis, no clubbing Skin:  No rashes no nodules Neuro:  CN II through XII intact, motor grossly intact   DEVICE  Normal device function.  See PaceArt for details. Optivol is stable.  Assess/Plan:  1. VT - he has stopped amio and is on mexitil 200 tid. He was in a slow VT today that he did not appreciate and we were able to pace him out of. 2. Chronic systolic heart failure - his symptoms are controlled. No change in meds. He is improved. 3. ICD - his Medtronic device is working normally. Will recheck in several months.  4. Atrial fib - he is at risk for stroke. He is tolerating his Eliquis for thromboembolic prevention but is having lots of skin bleeding and insists on his dose being reduced to 2.5 mg twice daily.  Leonia Reeves.D.

## 2016-04-08 LAB — CUP PACEART INCLINIC DEVICE CHECK
Battery Remaining Longevity: 74 mo
Battery Voltage: 3 V
Brady Statistic RV Percent Paced: 0.04 %
HIGH POWER IMPEDANCE MEASURED VALUE: 47 Ohm
HIGH POWER IMPEDANCE MEASURED VALUE: 55 Ohm
Implantable Lead Implant Date: 20040722
Implantable Lead Model: 6947
Lead Channel Impedance Value: 380 Ohm
Lead Channel Impedance Value: 437 Ohm
Lead Channel Pacing Threshold Amplitude: 0.75 V
Lead Channel Sensing Intrinsic Amplitude: 4 mV
Lead Channel Setting Pacing Pulse Width: 0.4 ms
MDC IDC LEAD LOCATION: 753860
MDC IDC MSMT LEADCHNL RV PACING THRESHOLD PULSEWIDTH: 0.4 ms
MDC IDC MSMT LEADCHNL RV SENSING INTR AMPL: 3.25 mV
MDC IDC PG IMPLANT DT: 20140103
MDC IDC SESS DTM: 20180302203230
MDC IDC SET LEADCHNL RV PACING AMPLITUDE: 2.5 V
MDC IDC SET LEADCHNL RV SENSING SENSITIVITY: 0.3 mV

## 2016-04-18 ENCOUNTER — Encounter: Payer: Medicare Other | Attending: Physical Medicine & Rehabilitation | Admitting: Physical Medicine & Rehabilitation

## 2016-04-18 ENCOUNTER — Encounter: Payer: Self-pay | Admitting: Physical Medicine & Rehabilitation

## 2016-04-18 VITALS — BP 122/80 | HR 63 | Resp 14

## 2016-04-18 DIAGNOSIS — Z87891 Personal history of nicotine dependence: Secondary | ICD-10-CM | POA: Insufficient documentation

## 2016-04-18 DIAGNOSIS — R269 Unspecified abnormalities of gait and mobility: Secondary | ICD-10-CM | POA: Insufficient documentation

## 2016-04-18 DIAGNOSIS — D649 Anemia, unspecified: Secondary | ICD-10-CM | POA: Diagnosis not present

## 2016-04-18 DIAGNOSIS — I4891 Unspecified atrial fibrillation: Secondary | ICD-10-CM | POA: Insufficient documentation

## 2016-04-18 DIAGNOSIS — Z808 Family history of malignant neoplasm of other organs or systems: Secondary | ICD-10-CM | POA: Insufficient documentation

## 2016-04-18 DIAGNOSIS — Z95810 Presence of automatic (implantable) cardiac defibrillator: Secondary | ICD-10-CM | POA: Diagnosis not present

## 2016-04-18 DIAGNOSIS — F419 Anxiety disorder, unspecified: Secondary | ICD-10-CM | POA: Insufficient documentation

## 2016-04-18 DIAGNOSIS — J432 Centrilobular emphysema: Secondary | ICD-10-CM | POA: Insufficient documentation

## 2016-04-18 DIAGNOSIS — R195 Other fecal abnormalities: Secondary | ICD-10-CM | POA: Diagnosis not present

## 2016-04-18 DIAGNOSIS — N179 Acute kidney failure, unspecified: Secondary | ICD-10-CM | POA: Insufficient documentation

## 2016-04-18 DIAGNOSIS — I252 Old myocardial infarction: Secondary | ICD-10-CM | POA: Diagnosis not present

## 2016-04-18 DIAGNOSIS — R0902 Hypoxemia: Secondary | ICD-10-CM | POA: Diagnosis not present

## 2016-04-18 DIAGNOSIS — D72820 Lymphocytosis (symptomatic): Secondary | ICD-10-CM | POA: Diagnosis not present

## 2016-04-18 DIAGNOSIS — Z9049 Acquired absence of other specified parts of digestive tract: Secondary | ICD-10-CM | POA: Insufficient documentation

## 2016-04-18 DIAGNOSIS — R251 Tremor, unspecified: Secondary | ICD-10-CM | POA: Diagnosis not present

## 2016-04-18 DIAGNOSIS — Z955 Presence of coronary angioplasty implant and graft: Secondary | ICD-10-CM | POA: Insufficient documentation

## 2016-04-18 DIAGNOSIS — D638 Anemia in other chronic diseases classified elsewhere: Secondary | ICD-10-CM

## 2016-04-18 DIAGNOSIS — Z950 Presence of cardiac pacemaker: Secondary | ICD-10-CM | POA: Diagnosis not present

## 2016-04-18 DIAGNOSIS — Z9981 Dependence on supplemental oxygen: Secondary | ICD-10-CM | POA: Diagnosis not present

## 2016-04-18 DIAGNOSIS — I472 Ventricular tachycardia: Secondary | ICD-10-CM | POA: Diagnosis not present

## 2016-04-18 DIAGNOSIS — T462X1S Poisoning by other antidysrhythmic drugs, accidental (unintentional), sequela: Secondary | ICD-10-CM | POA: Insufficient documentation

## 2016-04-18 DIAGNOSIS — Z801 Family history of malignant neoplasm of trachea, bronchus and lung: Secondary | ICD-10-CM | POA: Insufficient documentation

## 2016-04-18 DIAGNOSIS — Z823 Family history of stroke: Secondary | ICD-10-CM | POA: Insufficient documentation

## 2016-04-18 DIAGNOSIS — M1990 Unspecified osteoarthritis, unspecified site: Secondary | ICD-10-CM | POA: Diagnosis not present

## 2016-04-18 DIAGNOSIS — I110 Hypertensive heart disease with heart failure: Secondary | ICD-10-CM | POA: Insufficient documentation

## 2016-04-18 DIAGNOSIS — I2510 Atherosclerotic heart disease of native coronary artery without angina pectoris: Secondary | ICD-10-CM | POA: Diagnosis not present

## 2016-04-18 DIAGNOSIS — I5022 Chronic systolic (congestive) heart failure: Secondary | ICD-10-CM | POA: Insufficient documentation

## 2016-04-18 DIAGNOSIS — Z8719 Personal history of other diseases of the digestive system: Secondary | ICD-10-CM | POA: Diagnosis not present

## 2016-04-18 DIAGNOSIS — Z9841 Cataract extraction status, right eye: Secondary | ICD-10-CM | POA: Insufficient documentation

## 2016-04-18 DIAGNOSIS — Z8679 Personal history of other diseases of the circulatory system: Secondary | ICD-10-CM | POA: Insufficient documentation

## 2016-04-18 DIAGNOSIS — Z833 Family history of diabetes mellitus: Secondary | ICD-10-CM | POA: Insufficient documentation

## 2016-04-18 DIAGNOSIS — R5381 Other malaise: Secondary | ICD-10-CM

## 2016-04-18 DIAGNOSIS — J440 Chronic obstructive pulmonary disease with acute lower respiratory infection: Secondary | ICD-10-CM | POA: Diagnosis not present

## 2016-04-18 NOTE — Progress Notes (Signed)
Subjective:    Patient ID: [name], male    DOB: 07-10-1945, 72 y.o.   MRN: [id number]  HPI 71 y.o. male with history of CAD, A Fib, ICM s/p AICD, COPD, anxiety disorder, GIB, multiple hospitalizations for HCAP in the past few months presents for follow up for multiple medical issues, including heart, lungs and neurologic, including multiple episodes of desats, "passing out" and chest pain.  Last clinic 02/06/16.  Presents with wife, who supplements much of the history. Since that time he continues to follow with Pulm, Cards, and PCP.  Completed therapies, continues HEP.  He is on 2L of O2.  He saw Pulm and said he could wean as tolerated.  He has completed steroids.  He has not had any further autoimmune workup.  Bowel movements have improved with imodium.  He gets routine lab work from PCP and Cards. He is no longer requiring assistive device for ambulation.  Denies falls.   Pain Inventory Average Pain 2 Pain Right Now 0 My pain is no pain  In the last 24 hours, has pain interfered with the following? General activity 0 Relation with others 0 Enjoyment of life 0 What TIME of day is your pain at its worst? no pain Sleep (in general) Fair  Pain is worse with: no pain Pain improves with: medication and no pain Relief from Meds: no pain  Mobility walk without assistance ability to climb steps?  yes do you drive?  yes transfers alone  Function retired  Neuro/Psych weakness  Prior Studies Any changes since last visit?  no  Physicians involved in your care Any changes since last visit?  no   Family History  Problem Relation Age of Onset  . Diabetes Father   . Tracheal cancer Father 41    smoker  . Stroke Mother   . Cancer Sister     left eye  . CAD Neg Hx   . Colon cancer Neg Hx   . Prostate cancer Neg Hx    Social History   Social History  . Marital status: Married    Spouse name: N/A  . Number of children: 0  . Years of education: N/A    Occupational History  . [employer] truck driver (retired)    Social History Main Topics  . Smoking status: Former Smoker    Packs/day: 0.50    Years: 50.00    Types: Cigarettes, Cigars    Quit date: 09/27/2015  . Smokeless tobacco: Never Used  . Alcohol use No  . Drug use: No  . Sexual activity: Not Currently   Other Topics Concern  . None   Social History Narrative   Lives with wife, married 1998   Grown children, 2 great grandchildren   Occupation: retired, was Presenter, broadcasting   Activity: walking, fishing   Diet: good water daily, fruits/vegetables rare      Wife is Product manager.    4098-11, Human resources officer. No known agent orange exposure.     Past Surgical History:  Procedure Laterality Date  . CARDIAC CATHETERIZATION  2004   LAD 30%, D1 30%, CFX-AV groove 70-80%, OM1 30%, EF 20-25%  . CARDIAC CATHETERIZATION N/A 09/28/2015   Procedure: Left Heart Cath and Coronary Angiography;  Surgeon: Peter M Swaziland, MD;  Location: [facility] INVASIVE CV LAB;  Service: Cardiovascular;  Laterality: N/A;  . CARDIAC DEFIBRILLATOR PLACEMENT  2004  . CATARACT EXTRACTION W/ INTRAOCULAR LENS IMPLANT Right 01/2012  . COLONOSCOPY  01/08/2012   Procedure: COLONOSCOPY;  Surgeon: Iva Boop, MD;  Location: [facility];  Service: Endoscopy;  Laterality: N/A;  . CORONARY ANGIOPLASTY  1995   Pt thinks he got a balloon, living in [place], [place]  . IMPLANTABLE CARDIOVERTER DEFIBRILLATOR GENERATOR CHANGE N/A 02/07/2012   Procedure: IMPLANTABLE CARDIOVERTER DEFIBRILLATOR GENERATOR CHANGE;  Surgeon: Marinus Maw, MD; Medtronic Evera XT VR single-chamber serial number [id number], Laterality: Left  . INSERT / REPLACE / REMOVE PACEMAKER  2004   Medtronic ICD  . KNEE ARTHROSCOPY Left 05/2003   Hattie Perch 06/19/2010  . LAPAROSCOPIC CHOLECYSTECTOMY  1/ 2012  . SHOULDER ARTHROSCOPY W/ ROTATOR CUFF REPAIR Right twice  . TONSILLECTOMY AND ADENOIDECTOMY  ~ 5003   Past Medical History:  Diagnosis Date  . Acute lower GI bleeding  12/11/2011   "first time" (12/11/2011)  . Acute myocardial infarction, unspecified site, episode of care unspecified 1995   Pt living in [place], no stent, ?PTCA  . AICD (automatic cardioverter/defibrillator) present   . Allergic rhinitis, cause unspecified   . Anxiety   . Arthritis    "all over" (11/07/2015)  . Atrial fibrillation (HCC)    a. 05/2015 - converted to sinus in setting of ICD shocks; placed on eliquis 5 bid.  [name] CAD (coronary artery disease), autologous vein bypass graft   . Cervical herniated disc    told not to lift >10 lbs  . Chronic systolic CHF (congestive heart failure), NYHA class 2 (HCC)    Reports EF of 25%.   [name] COPD (chronic obstructive pulmonary disease) (HCC) 11/2012   by xray  . Diverticulosis    by CT scan  . HCAP (healthcare-associated pneumonia) 11/06/2015  . Hypertension   . Insomnia   . Paroxysmal ventricular tachycardia (HCC)   . Perennial allergic rhinitis    only to dust mites  . Pneumonia 2000s   "walking pneumonia"  . VT (ventricular tachycardia) (HCC)    a. 05/2015 - VT storm with multiple ICD shocks-->Amio 400 BID.   BP 122/80 (BP Location: Left Arm, Patient Position: Sitting, Cuff Size: Normal)   Pulse 63   Resp 14   SpO2 90%   Opioid Risk Score:   Fall Risk Score:  `1  Depression screen PHQ 2/9  Depression screen [facility] 2/9 12/20/2015 11/17/2015 09/15/2014 08/17/2013 06/25/2012  Decreased Interest 0 0 0 0 0  Down, Depressed, Hopeless 0 0 0 0 0  PHQ - 2 Score 0 0 0 0 0  Altered sleeping 0 - - - -  Tired, decreased energy 0 - - - -  Change in appetite 0 - - - -  Feeling bad or failure about yourself  0 - - - -  Trouble concentrating 0 - - - -  Moving slowly or fidgety/restless 0 - - - -  Suicidal thoughts 0 - - - -  PHQ-9 Score 0 - - - -  Difficult doing work/chores Not difficult at all - - - -  Some recent data might be hidden    Review of Systems  HENT: Negative.   Eyes: Negative.   Respiratory: Positive for shortness of breath.    Cardiovascular: Negative.   Gastrointestinal: Negative.   Endocrine: Negative.   Genitourinary: Negative.   Musculoskeletal: Negative.   Skin: Negative.   Allergic/Immunologic: Negative.   Hematological: Negative.   Psychiatric/Behavioral: Negative.   All other systems reviewed and are negative.      Objective:   Physical Exam Constitutional: Well developed. NAD. Vital signs reviewed.  HENT: Normocephalic and atraumatic.  Eyes:  EOMI. No discharge.  Cardiovascular: RRR. No JVD. Respiratory: +Peterson. Clear. Unlabored. GI: Soft. Bowel sounds are normal.  Musculoskeletal: He exhibits no edema or tenderness.  Neurological: He is alert and oriented.  Motor: 4+/5 throughout (improving) Skin: Skin is warm and dry.  Not diaphoretic.    Assessment & Plan:  71 y.o. male with history of CAD, A Fib, ICM s/p AICD, COPD, anxiety disorder, GIB, multiple hospitalizations for HCAP in the past few months presents for follow up for multiple medical issues, including heart, lungs and neurologic, including multiple episodes of desats, "passing out", GI bleed, and chest pain.  1.  Weakness and poor activity tolerance secondary to debility from multiple medical issues  Cont to follow up with Pulm, Cards, PCP  Completed therapies, cont HEP  2. Amiodarone toxicity/Centrilobular emphysema with respiratory failure:   Follow up with Pulm  Cont supplemental O2, wean per Cards  Follow up for rule out of autoimmune disorder per PCP  3. VT/VF s/p AICD:   Cont meds  Cont recs per Cards  4. CAD with chronic systolic CHF with severe LVD:   Cont Cards follow up  Cont meds  5. Chronic loose stools:   Cont immodium             Chronic loose stools after cholecystectomy  >25 minutes spent with patient with >20 [name] spent in education and counseling regarding tremors, physical recovery, supplemental oxygen

## 2016-04-23 ENCOUNTER — Telehealth: Payer: Self-pay

## 2016-04-23 NOTE — Telephone Encounter (Signed)
Spoke with pt regarding shock from 3/20 @ 1:00am. Pt stated that he was sitting in the recliner, he felt fine had taken all his medications that day. After her received the shock he stated that he felt ok, went to bed and slept through the night and has felt good this morning. Informed pt that information would be reviewed with Dr. [name] and if he had any recommendations pt would receive a call back.Pt voiced understanding of driving restrictions.

## 2016-04-24 ENCOUNTER — Other Ambulatory Visit: Payer: Self-pay | Admitting: Internal Medicine

## 2016-04-25 ENCOUNTER — Encounter (HOSPITAL_COMMUNITY): Payer: Self-pay | Admitting: Emergency Medicine

## 2016-04-25 ENCOUNTER — Emergency Department (HOSPITAL_COMMUNITY)
Admission: EM | Admit: 2016-04-25 | Discharge: 2016-04-25 | Disposition: A | Discharge status: Home / Self Care | Payer: Medicare Other | Attending: Emergency Medicine | Admitting: Emergency Medicine

## 2016-04-25 ENCOUNTER — Emergency Department (HOSPITAL_COMMUNITY): Payer: Medicare Other

## 2016-04-25 DIAGNOSIS — Z4502 Encounter for adjustment and management of automatic implantable cardiac defibrillator: Secondary | ICD-10-CM

## 2016-04-25 DIAGNOSIS — Z87891 Personal history of nicotine dependence: Secondary | ICD-10-CM | POA: Diagnosis not present

## 2016-04-25 DIAGNOSIS — R1111 Vomiting without nausea: Secondary | ICD-10-CM | POA: Diagnosis not present

## 2016-04-25 DIAGNOSIS — Z79899 Other long term (current) drug therapy: Secondary | ICD-10-CM | POA: Diagnosis not present

## 2016-04-25 DIAGNOSIS — Z7901 Long term (current) use of anticoagulants: Secondary | ICD-10-CM | POA: Diagnosis not present

## 2016-04-25 DIAGNOSIS — I5022 Chronic systolic (congestive) heart failure: Secondary | ICD-10-CM | POA: Insufficient documentation

## 2016-04-25 DIAGNOSIS — Z95810 Presence of automatic (implantable) cardiac defibrillator: Secondary | ICD-10-CM | POA: Insufficient documentation

## 2016-04-25 DIAGNOSIS — J449 Chronic obstructive pulmonary disease, unspecified: Secondary | ICD-10-CM | POA: Insufficient documentation

## 2016-04-25 DIAGNOSIS — Z951 Presence of aortocoronary bypass graft: Secondary | ICD-10-CM | POA: Diagnosis not present

## 2016-04-25 DIAGNOSIS — I252 Old myocardial infarction: Secondary | ICD-10-CM | POA: Insufficient documentation

## 2016-04-25 DIAGNOSIS — I472 Ventricular tachycardia, unspecified: Secondary | ICD-10-CM

## 2016-04-25 DIAGNOSIS — I4891 Unspecified atrial fibrillation: Secondary | ICD-10-CM | POA: Diagnosis not present

## 2016-04-25 DIAGNOSIS — I110 Hypertensive heart disease with heart failure: Secondary | ICD-10-CM | POA: Diagnosis not present

## 2016-04-25 DIAGNOSIS — I2510 Atherosclerotic heart disease of native coronary artery without angina pectoris: Secondary | ICD-10-CM | POA: Diagnosis not present

## 2016-04-25 DIAGNOSIS — R079 Chest pain, unspecified: Secondary | ICD-10-CM | POA: Diagnosis present

## 2016-04-25 DIAGNOSIS — R000 Tachycardia, unspecified: Secondary | ICD-10-CM | POA: Diagnosis not present

## 2016-04-25 LAB — CBC WITH DIFFERENTIAL/PLATELET
Basophils Absolute: 0.1 10*3/uL (ref 0.0–0.1)
Basophils Relative: 1 %
Eosinophils Absolute: 0.4 10*3/uL (ref 0.0–0.7)
Eosinophils Relative: 5 %
HEMATOCRIT: 32.7 % — AB (ref 39.0–52.0)
HEMOGLOBIN: 9.5 g/dL — AB (ref 13.0–17.0)
LYMPHS ABS: 2.2 10*3/uL (ref 0.7–4.0)
Lymphocytes Relative: 25 %
MCH: 23.5 pg — AB (ref 26.0–34.0)
MCHC: 29.1 g/dL — ABNORMAL LOW (ref 30.0–36.0)
MCV: 80.7 fL (ref 78.0–100.0)
Monocytes Absolute: 0.8 10*3/uL (ref 0.1–1.0)
Monocytes Relative: 9 %
NEUTROS ABS: 5.3 10*3/uL (ref 1.7–7.7)
NEUTROS PCT: 60 %
Platelets: 347 10*3/uL (ref 150–400)
RBC: 4.05 MIL/uL — ABNORMAL LOW (ref 4.22–5.81)
RDW: 17.5 % — ABNORMAL HIGH (ref 11.5–15.5)
WBC: 8.8 10*3/uL (ref 4.0–10.5)

## 2016-04-25 LAB — I-STAT CHEM 8, ED
BUN: 21 mg/dL — ABNORMAL HIGH (ref 6–20)
CHLORIDE: 108 mmol/L (ref 101–111)
Calcium, Ion: 1.11 mmol/L — ABNORMAL LOW (ref 1.15–1.40)
Creatinine, Ser: 1.1 mg/dL (ref 0.61–1.24)
Glucose, Bld: 101 mg/dL — ABNORMAL HIGH (ref 65–99)
HCT: 31 % — ABNORMAL LOW (ref 39.0–52.0)
Hemoglobin: 10.5 g/dL — ABNORMAL LOW (ref 13.0–17.0)
POTASSIUM: 4.1 mmol/L (ref 3.5–5.1)
Sodium: 140 mmol/L (ref 135–145)
TCO2: 24 mmol/L (ref 0–100)

## 2016-04-25 LAB — BASIC METABOLIC PANEL
Anion gap: 11 (ref 5–15)
BUN: 19 mg/dL (ref 6–20)
CHLORIDE: 105 mmol/L (ref 101–111)
CO2: 23 mmol/L (ref 22–32)
CREATININE: 1.1 mg/dL (ref 0.61–1.24)
Calcium: 8.5 mg/dL — ABNORMAL LOW (ref 8.9–10.3)
GFR calc non Af Amer: >60 mL/min (ref >60)
Glucose, Bld: 106 mg/dL — ABNORMAL HIGH (ref 65–99)
Potassium: 4.2 mmol/L (ref 3.5–5.1)
Sodium: 139 mmol/L (ref 135–145)

## 2016-04-25 LAB — I-STAT TROPONIN, ED: Troponin i, poc: 0 ng/mL (ref 0.00–0.08)

## 2016-04-25 MED ORDER — MEXILETINE HCL 150 MG PO CAPS: 300.0000 mg | ORAL_CAPSULE | Freq: Three times a day (TID) | ORAL | 3 refills | Status: DC

## 2016-04-25 NOTE — ED Triage Notes (Addendum)
Per EMS: pt pacemaker/defib shocked him tonight at 0030.  Pt is currently NSR.   Brand : Medtronic  Pt has had 1 of nitro and 324 of ASP, 4 mg of Zofran Pt currently no c/o of CP

## 2016-04-25 NOTE — Discharge Instructions (Signed)
You are scheduled for your heart ablation procedure on Monday 04/29/16 with Dr. [name] at 1:00PM, please arrive to the main entrance [facility] tower) and check in at the desk at 11:00AM.    Do not eat after midnight the evening prior You may take your morning medicines with sips of water only, except your lasix (furosemide)  Take your Mexiletine and Eliquis Sunday morning as usual on 04/29/15, then do not take any more of these two medicines in preparation for your procedure on Monday.

## 2016-04-25 NOTE — ED Provider Notes (Signed)
MSE was initiated and I personally evaluated the patient and placed orders (if any) at  2:25 AM on April 25, 2016.  The patient appears stable so that the remainder of the MSE may be completed by another provider.  Was provided with the patient's EKG. He does have some changes and some discordant elevation inferiorly. I medically screened the patient as Dr. Patria Mane was with another patient at this time. Patient does not have any chest pain or shortness of breath. He reports that his defibrillator fired prior to arrival. He states that he "gets a funny feeling" prior to his defibrillator firing.  He currently feels at his baseline. Denies chest pain or shortness of breath. Denies any recent other symptoms. Denies any lower leg swelling. Given lack of ACS symptoms, will obtain lab work and interrogate his defibrillator.  Further workup per Dr. Patria Mane.   EKG Interpretation  Date/Time:  Thursday April 25 2016 01:51:40 EDT Ventricular Rate:  111 PR Interval:    QRS Duration: 226 QT Interval:  479 QTC Calculation: 652 R Axis:   9 Text Interpretation:  AV dissociation IVCD, consider atypical LBBB Discordant elevation in III, avf No active CP Confirmed by Wilkie Aye  MD, Xitlali Kastens ([id number]) on 04/25/2016 2:19:09 AM         Shon Baton, MD 04/25/16 [phone]

## 2016-04-25 NOTE — ED Notes (Signed)
Cardiology at bedside.

## 2016-04-25 NOTE — ED Notes (Signed)
[employer] report placed in pt's room on side table.

## 2016-04-25 NOTE — Consult Note (Signed)
HPI [name] presents to the ED with more VT. He is a 71 year old man with an ischemic cardiomyopathy, chronic systolic heart failure, class III, chronic atrial fibrillation, previously unwilling to take any anticoagulation, ventricular tachycardia, status post ICD implantation. He was in the hospital several months ago with VT storm but ultimately developed amio lung toxicity and had to be switched to mexitil. He is also on ranexa. He has been bothered by hypotension and had to have his losartan stopped. He denies chest pain. He does not have palpitations. He has been defibrillated a couple of times this month. Last night he was getting ready for bed and felt his heart racing and received a couple of ICD shocks. On presentation to the ED he was in a slow VT at 120/min. He reverted back to NSR. He denies missing any of his meds. His CHF symptoms have been fairly well controlled, maybe improved and he has not had atrial fib, despite coming off of amiodarone.      Allergies  Allergen Reactions  . Ace Inhibitors Other (See Comments)    muscle pain. Tolerates ARBs.   . Codeine Other (See Comments)    "head wants to explode."  . Doxycycline Diarrhea and Nausea And Vomiting  . Kionex [Sodium Polystyrene Sulfonate] Other (See Comments)    SOB, pressure in chest, weakness fatigue.   [name] Penicillins Swelling    "started at point of injection; w/in 3 min my upper arm was swollen 3 times normal" Has patient had a PCN reaction causing immediate rash, facial/tongue/throat swelling, SOB or lightheadedness with hypotension: Yes Has patient had a PCN reaction causing severe rash involving mucus membranes or skin necrosis: No Has patient had a PCN reaction that required hospitalization No Has patient had a PCN reaction occurring within the last 10 years: No If all of the above answers are "NO", then may proceed wi  . Lisinopril Other (See Comments)    Muscle Pain  . Statins Other (See Comments)     Myalgias per patient  . Lidocaine     Hallucinations, jerking  . Pacerone [Amiodarone] Other (See Comments)    Lung and heart problem           Current Outpatient Prescriptions  Medication Sig Dispense Refill  . acetaminophen (TYLENOL) 325 MG tablet Take 2 tablets (650 mg total) by mouth every 6 (six) hours as needed for mild pain (or Fever >/= 101).    [name] ALPRAZolam (XANAX) 0.25 MG tablet TAKE 1 TABLET BY MOUTH 3 TIMES DAILY AS NEEDED FOR ANXIETY OR SLEEP 30 tablet 1  . benzonatate (TESSALON) 200 MG capsule Take 1 capsule (200 mg total) by mouth every 4 (four) hours as needed for cough. 20 capsule 0  . Chlorpheniramine-DM (CORICIDIN HBP COUGH/COLD PO) Take 2 capsules by mouth daily as needed (cold).     . furosemide (LASIX) 20 MG tablet Take 1 tablet (20 mg total) by mouth every Monday, Wednesday, and Friday. 45 tablet 3  . guaiFENesin (MUCINEX) 600 MG 12 hr tablet Take 600 mg by mouth 2 (two) times daily.     [name] HYDROcodone-acetaminophen (NORCO/VICODIN) 5-325 MG tablet Take 1 tablet by mouth every 6 (six) hours as needed for moderate pain.    [name] loperamide (IMODIUM) 2 MG capsule Take 1 capsule (2 mg total) by mouth as needed for diarrhea or loose stools. (Patient taking differently: Take 4 mg by mouth See admin instructions. Take 2 tablets after each bowl movement.) 30 capsule 0  . metaxalone (  SKELAXIN) 800 MG tablet Take 1 tablet (800 mg total) by mouth daily as needed for muscle spasms. 30 tablet 1  . metoprolol succinate (TOPROL XL) 25 MG 24 hr tablet Take 1 tablet (25 mg total) by mouth daily. 90 tablet 3  . mexiletine (MEXITIL) 200 MG capsule Take 1 capsule (200 mg total) by mouth every 8 (eight) hours. 270 capsule 3  . nitroGLYCERIN (NITROSTAT) 0.4 MG SL tablet Place 1 tablet (0.4 mg total) under the tongue every 5 (five) minutes as needed for chest pain. 25 tablet 1  . NON FORMULARY Oxygen 3 liters   24/7    . promethazine (PHENERGAN) 25 MG tablet Take 25 mg by mouth  every 6 (six) hours as needed for nausea or vomiting.    [name] RANEXA 500 MG 12 hr tablet TAKE 1 TABLET (500 MG TOTAL) BY MOUTH 2 (TWO) TIMES DAILY. 180 tablet 3  . ranitidine (ZANTAC) 150 MG capsule Take 150 mg by mouth daily as needed for heartburn.     . Simethicone (GAS-X PO) Take by mouth as needed (Take as directed).     [name] apixaban (ELIQUIS) 2.5 MG TABS tablet Take 1 tablet (2.5 mg total) by mouth 2 (two) times daily. 180 tablet 3   No current facility-administered medications for this visit.             Facility-Administered Medications Ordered in Other Visits  Medication Dose Route Frequency Provider Last Rate Last Dose  . sodium chloride 0.9 % injection 3 mL  3 mL Intravenous Q12H Marinus Maw, MD      . sodium chloride 0.9 % injection 3 mL  3 mL Intravenous PRN Marinus Maw, MD             Past Medical History:  Diagnosis Date  . Acute lower GI bleeding 12/11/2011   "first time" (12/11/2011)  . Acute myocardial infarction, unspecified site, episode of care unspecified 1995   Pt living in [place], no stent, ?PTCA  . AICD (automatic cardioverter/defibrillator) present   . Allergic rhinitis, cause unspecified   . Anxiety   . Arthritis    "all over" (11/07/2015)  . Atrial fibrillation (HCC)    a. 05/2015 - converted to sinus in setting of ICD shocks; placed on eliquis 5 bid.  [name] CAD (coronary artery disease), autologous vein bypass graft   . Cervical herniated disc    told not to lift >10 lbs  . Chronic systolic CHF (congestive heart failure), NYHA class 2 (HCC)    Reports EF of 25%.   [name] COPD (chronic obstructive pulmonary disease) (HCC) 11/2012   by xray  . Diverticulosis    by CT scan  . HCAP (healthcare-associated pneumonia) 11/06/2015  . Hypertension   . Insomnia   . Paroxysmal ventricular tachycardia (HCC)   . Perennial allergic rhinitis    only to dust mites  . Pneumonia 2000s   "walking pneumonia"  . VT (ventricular tachycardia)  (HCC)    a. 05/2015 - VT storm with multiple ICD shocks-->Amio 400 BID.    ROS:   All systems reviewed and negative except as noted in the HPI.        Past Surgical History:  Procedure Laterality Date  . CARDIAC CATHETERIZATION  2004   LAD 30%, D1 30%, CFX-AV groove 70-80%, OM1 30%, EF 20-25%  . CARDIAC CATHETERIZATION N/A 09/28/2015   Procedure: Left Heart Cath and Coronary Angiography;  Surgeon: Peter M Swaziland, MD;  Location: [facility] INVASIVE CV LAB;  Service: Cardiovascular;  Laterality: N/A;  . CARDIAC DEFIBRILLATOR PLACEMENT  2004  . CATARACT EXTRACTION W/ INTRAOCULAR LENS IMPLANT Right 01/2012  . COLONOSCOPY  01/08/2012   Procedure: COLONOSCOPY;  Surgeon: Iva Boop, MD;  Location: [facility] ENDOSCOPY;  Service: Endoscopy;  Laterality: N/A;  . CORONARY ANGIOPLASTY  1995   Pt thinks he got a balloon, living in [place], [place]  . IMPLANTABLE CARDIOVERTER DEFIBRILLATOR GENERATOR CHANGE N/A 02/07/2012   Procedure: IMPLANTABLE CARDIOVERTER DEFIBRILLATOR GENERATOR CHANGE;  Surgeon: Marinus Maw, MD; Medtronic Evera XT VR single-chamber serial number [id number], Laterality: Left  . INSERT / REPLACE / REMOVE PACEMAKER  2004   Medtronic ICD  . KNEE ARTHROSCOPY Left 05/2003   Hattie Perch 06/19/2010  . LAPAROSCOPIC CHOLECYSTECTOMY  1/ 2012  . SHOULDER ARTHROSCOPY W/ ROTATOR CUFF REPAIR Right twice  . TONSILLECTOMY AND ADENOIDECTOMY  ~ 1951           Family History  Problem Relation Age of Onset  . Diabetes Father   . Tracheal cancer Father 74    smoker  . Stroke Mother   . Cancer Sister     left eye  . CAD Neg Hx   . Colon cancer Neg Hx   . Prostate cancer Neg Hx      Social History        Social History  . Marital status: Married    Spouse name: N/A  . Number of children: 0  . Years of education: N/A       Occupational History  . [employer] truck driver (retired)         Social History Main Topics  . Smoking status: Former Smoker     Packs/day: 0.50    Years: 50.00    Types: Cigarettes, Cigars    Quit date: 09/27/2015  . Smokeless tobacco: Never Used  . Alcohol use No  . Drug use: No  . Sexual activity: Not Currently       Other Topics Concern  . Not on file      Social History Narrative   Lives with wife, married 1998   Grown children, 2 great grandchildren   Occupation: retired, was Presenter, broadcasting   Activity: walking, fishing   Diet: good water daily, fruits/vegetables rare      Wife is Product manager.    4098-11, Human resources officer. No known agent orange exposure.       BP 116/68   Pulse 75   Ht 5\' 3"  (1.6 m)   Wt 145 lb (65.8 kg)   SpO2 94%   BMI 25.69 kg/m   Physical Exam:  Chronically ill appearing 71 yo man, NAD HEENT: Unremarkable Neck:  6 cm JVD, no thyromegally Lungs:  Clear except for rales in the bases. No wheezes or rhonchi. Well-healed ICD incision. HEART:  Regular brady rhythm, no murmurs, no rubs, no clicks Abd:  soft, positive bowel sounds, no organomegally, no rebound, no guarding Ext:  2 plus pulses, no edema, no cyanosis, no clubbing Skin:  No rashes no nodules Neuro:  CN II through XII intact, motor grossly intact   DEVICE  Normal device function.  See PaceArt for details. Optivol is stable. He has had several episodes of VT at 190/min with multiple unsuccessful ATP's.  Assess/Plan:  1. VT - we will uptitrate his mexitil to 300 mg tid. I will have him return in 4 days for VT ablation. I have discussed the indications, risks/benefits/goals/expectations of the procedure and he wishes to proceed. 2. Chronic systolic  heart failure - his symptoms are controlled. No change in meds. He is improved. 3. ICD - his Medtronic device is working normally. Will recheck in several months.  4. Atrial fib - he is at risk for stroke. He is tolerating his Eliquis for thromboembolic prevention but is having lots of skin bleeding and insists on his dose being reduced to 2.5 mg twice  daily. His Eliquis will be held prior to the ablation for a day.  [name].D.

## 2016-04-25 NOTE — ED Notes (Signed)
Patient called out stating he felt "hot" and like his ICD was charging and may fire, pt resp e/u, remained in NSR on monitor, pt denied any chest pain and was advised to let this RN know if he began to have any or felt his ICD fire.

## 2016-04-25 NOTE — ED Notes (Signed)
PA Keitha Butte at bedside

## 2016-04-25 NOTE — ED Notes (Signed)
This RN paged PA Keitha Butte regarding if patient was ready for discharge, PA Keitha Butte advised she would be down in approx 15 mins to go over d/c information with patient prior to him being discharged.

## 2016-04-25 NOTE — ED Provider Notes (Addendum)
Spoke with cardiology Dr Santiago Glad, who will have the EP team see him in the ER. Asymptomatic now. NSR. Dr Santiago Glad has reviewed both ecgs   Azalia Bilis, MD 04/25/16 9311    Azalia Bilis, MD 04/25/16 [phone]

## 2016-04-25 NOTE — ED Provider Notes (Signed)
[facility]EMER[facility]Y DEPT Provider Note   CSN: [id number] Arrival date & time: 04/25/16  0145  By signing my name below, I, Arianna Nassar, attest that this documentation has been prepared under the direction and in the presence of Azalia Bilis, MD.  Electronically Signed: Octavia Heir, ED Scribe. 04/25/16. 3:43 AM.    History   Chief Complaint Chief Complaint  Patient presents with  . Chest Pain   The history is provided by the patient. No language interpreter was used.   HPI Comments: [name] is a 71 y.o. male brought in by ambulance, who has a PMhx of AICD, a-fib, CAD, CHF, COPD, paroxpresents to the Emergency Department presenting with a defibrillator problem ~ 3 hours ago. Pt says he was watching television when his defibrillator "fired". Pt was shocked two days ago and again this morning at 7am. He was also in a non-sustained v-tach run this afternoon but he notes he did not feel that. He says his episode this evening did not feel like a prior episode of him "falling to his knees". He is currently on Eliquis and Melixetine. Pt has no chest pain or shortness of breath.    Cardiologist: Dr. [name] Past Medical History:  Diagnosis Date  . Acute lower GI bleeding 12/11/2011   "first time" (12/11/2011)  . Acute myocardial infarction, unspecified site, episode of care unspecified 1995   Pt living in [place], no stent, ?PTCA  . AICD (automatic cardioverter/defibrillator) present   . Allergic rhinitis, cause unspecified   . Anxiety   . Arthritis    "all over" (11/07/2015)  . Atrial fibrillation (HCC)    a. 05/2015 - converted to sinus in setting of ICD shocks; placed on eliquis 5 bid.  [name] CAD (coronary artery disease), autologous vein bypass graft   . Cervical herniated disc    told not to lift >10 lbs  . Chronic systolic CHF (congestive heart failure), NYHA class 2 (HCC)    Reports EF of 25%.   [name] COPD (chronic obstructive pulmonary disease) (HCC) 11/2012   by xray  .  Diverticulosis    by CT scan  . HCAP (healthcare-associated pneumonia) 11/06/2015  . Hypertension   . Insomnia   . Paroxysmal ventricular tachycardia (HCC)   . Perennial allergic rhinitis    only to dust mites  . Pneumonia 2000s   "walking pneumonia"  . VT (ventricular tachycardia) (HCC)    a. 05/2015 - VT storm with multiple ICD shocks-->Amio 400 BID.    Patient Active Problem List   Diagnosis Date Noted  . Pressure injury of skin 03/11/2016  . Ventricular tachycardia (HCC) 03/10/2016  . Hyperkalemia 02/13/2016  . Acute respiratory failure with hypoxemia (HCC)   . SOB (shortness of breath)   . Pleural effusion   . AKI (acute kidney injury) (HCC)   . Centrilobular emphysema (HCC)   . ARDS (adult respiratory distress syndrome) (HCC)   . Acute pulmonary edema (HCC)   . Hypoxemia   . Postcholecystectomy diarrhea 12/12/2015  . Coronary artery disease involving native coronary artery with angina pectoris (HCC) 12/12/2015  . Loose stools   . Unstable angina pectoris (HCC)   . Hypoalbuminemia due to protein-calorie malnutrition (HCC)   . Physical debility 12/06/2015  . Amiodarone toxicity   . Anemia of chronic disease   . Anxiety about health   . Chronic systolic CHF (congestive heart failure) (HCC)   . Coronary artery disease involving native coronary artery of native heart without angina pectoris   . Diarrhea   .  History of GI bleed   . History of ventricular fibrillation   . Lymphocytosis   . PAF (paroxysmal atrial fibrillation) (HCC)   . Respiratory distress   . S/P implantation of automatic cardioverter/defibrillator (AICD)   . Supplemental oxygen dependent   . Acute hypoxemic respiratory failure (HCC)   . Malnutrition of moderate degree 11/29/2015  . Opacity of lung on imaging study   . Physical deconditioning 11/28/2015  . Transaminitis 11/28/2015  . Anemia 11/28/2015  . Elevated troponin   . Hypoxia   . AICD (automatic cardioverter/defibrillator) present  11/08/2015  . HCAP (healthcare-associated pneumonia) 11/07/2015  . Acute respiratory failure with hypoxia (HCC) 11/07/2015  . Fall at home 10/11/2015  . VT (ventricular tachycardia) (HCC) 09/27/2015  . Memory loss 09/24/2015  . Encounter for screening examination for infectious disease 09/24/2015  . Chronic atrial fibrillation (HCC) 05/28/2015  . History of ventricular tachycardia 05/25/2015  . Syncope 05/12/2015  . Bradycardia 05/12/2015  . Advance care planning 09/16/2014  . Muscle ache 09/16/2014  . Back pain 09/08/2013  . Abdominal pain, chronic, epigastric 09/08/2013  . Fatigue 06/11/2013  . Chest wall mass 11/05/2012  . Arthritis   . Insomnia   . Anxiety   . Diverticulosis 12/11/2011  . Tobacco abuse 03/06/2011  . Ischemic cardiomyopathy 02/12/2011  . Acute on chronic systolic congestive heart failure (HCC) 05/10/2010  . Coronary atherosclerosis 10/31/2008  . Hyperlipidemia 08/25/2008  . ALLERGIC RHINITIS 08/25/2008  . Amiodarone pulmonary toxicity 08/25/2008    Past Surgical History:  Procedure Laterality Date  . CARDIAC CATHETERIZATION  2004   LAD 30%, D1 30%, CFX-AV groove 70-80%, OM1 30%, EF 20-25%  . CARDIAC CATHETERIZATION N/A 09/28/2015   Procedure: Left Heart Cath and Coronary Angiography;  Surgeon: Peter M Swaziland, MD;  Location: [facility] INVASIVE CV LAB;  Service: Cardiovascular;  Laterality: N/A;  . CARDIAC DEFIBRILLATOR PLACEMENT  2004  . CATARACT EXTRACTION W/ INTRAOCULAR LENS IMPLANT Right 01/2012  . COLONOSCOPY  01/08/2012   Procedure: COLONOSCOPY;  Surgeon: Iva Boop, MD;  Location: [facility] ENDOSCOPY;  Service: Endoscopy;  Laterality: N/A;  . CORONARY ANGIOPLASTY  1995   Pt thinks he got a balloon, living in [place], [place]  . IMPLANTABLE CARDIOVERTER DEFIBRILLATOR GENERATOR CHANGE N/A 02/07/2012   Procedure: IMPLANTABLE CARDIOVERTER DEFIBRILLATOR GENERATOR CHANGE;  Surgeon: Marinus Maw, MD; Medtronic Evera XT VR single-chamber serial number [id number],  Laterality: Left  . INSERT / REPLACE / REMOVE PACEMAKER  2004   Medtronic ICD  . KNEE ARTHROSCOPY Left 05/2003   Hattie Perch 06/19/2010  . LAPAROSCOPIC CHOLECYSTECTOMY  1/ 2012  . SHOULDER ARTHROSCOPY W/ ROTATOR CUFF REPAIR Right twice  . TONSILLECTOMY AND ADENOIDECTOMY  ~ 1951       Home Medications    Prior to Admission medications   Medication Sig Start Date End Date Taking? Authorizing Provider  acetaminophen (TYLENOL) 325 MG tablet Take 2 tablets (650 mg total) by mouth every 6 (six) hours as needed for mild pain (or Fever >/= 101). 12/06/15  Yes Joseph Art, DO  ALPRAZolam (XANAX) 0.25 MG tablet TAKE 1 TABLET BY MOUTH 3 TIMES DAILY AS NEEDED FOR ANXIETY OR SLEEP 02/12/16  Yes Joaquim Nam, MD  apixaban (ELIQUIS) 2.5 MG TABS tablet Take 1 tablet (2.5 mg total) by mouth 2 (two) times daily. 04/05/16  Yes Marinus Maw, MD  benzonatate (TESSALON) 200 MG capsule Take 1 capsule (200 mg total) by mouth every 4 (four) hours as needed for cough. 12/06/15  Yes Joseph Art,  DO  Chlorpheniramine-DM (CORICIDIN HBP COUGH/COLD PO) Take 2 capsules by mouth daily as needed (cold).    Yes Historical Provider, MD  furosemide (LASIX) 20 MG tablet Take 1 tablet (20 mg total) by mouth every Monday, Wednesday, and Friday. 04/05/16  Yes Marinus Maw, MD  guaiFENesin (MUCINEX) 600 MG 12 hr tablet Take 600 mg by mouth 2 (two) times daily.    Yes Historical Provider, MD  HYDROcodone-acetaminophen (NORCO/VICODIN) 5-325 MG tablet Take 1 tablet by mouth every 6 (six) hours as needed for moderate pain.   Yes Historical Provider, MD  loperamide (IMODIUM) 2 MG capsule Take 1 capsule (2 mg total) by mouth as needed for diarrhea or loose stools. Patient taking differently: Take 4 mg by mouth See admin instructions. Take 2 tablets after each bowl movement. 11/21/15  Yes Dorothea Ogle, MD  metoprolol succinate (TOPROL XL) 25 MG 24 hr tablet Take 1 tablet (25 mg total) by mouth daily. 04/05/16  Yes Marinus Maw, MD    mexiletine (MEXITIL) 200 MG capsule Take 1 capsule (200 mg total) by mouth every 8 (eight) hours. 04/05/16  Yes Marinus Maw, MD  nitroGLYCERIN (NITROSTAT) 0.4 MG SL tablet Place 1 tablet (0.4 mg total) under the tongue every 5 (five) minutes as needed for chest pain. 03/13/16  Yes Arty Baumgartner, NP  promethazine (PHENERGAN) 25 MG tablet Take 25 mg by mouth every 6 (six) hours as needed for nausea or vomiting.   Yes Historical Provider, MD  RANEXA 500 MG 12 hr tablet TAKE 1 TABLET (500 MG TOTAL) BY MOUTH 2 (TWO) TIMES DAILY. 04/01/16  Yes Amber Caryl Bis, NP  ranitidine (ZANTAC) 150 MG capsule Take 150 mg by mouth daily as needed for heartburn.    Yes Historical Provider, MD  metaxalone (SKELAXIN) 800 MG tablet Take 1 tablet (800 mg total) by mouth daily as needed for muscle spasms. Patient not taking: Reported on 04/25/2016 02/12/16   Joaquim Nam, MD  NON FORMULARY Oxygen 3 liters   24/7    Historical Provider, MD    Family History Family History  Problem Relation Age of Onset  . Diabetes Father   . Tracheal cancer Father 82    smoker  . Stroke Mother   . Cancer Sister     left eye  . CAD Neg Hx   . Colon cancer Neg Hx   . Prostate cancer Neg Hx     Social History Social History  Substance Use Topics  . Smoking status: Former Smoker    Packs/day: 0.50    Years: 50.00    Types: Cigarettes, Cigars    Quit date: 09/27/2015  . Smokeless tobacco: Never Used  . Alcohol use No     Allergies   Ace inhibitors; Codeine; Doxycycline; Kionex [sodium polystyrene sulfonate]; Penicillins; Lisinopril; Statins; Lidocaine; and Pacerone [amiodarone]   Review of Systems Review of Systems  A complete 10 system review of systems was obtained and all systems are negative except as noted in the HPI and PMH.   Physical Exam Updated Vital Signs BP 102/68   Pulse 68   Temp 98.2 F (36.8 C) (Oral)   Resp 14   Ht 5\' 3"  (1.6 m)   Wt 140 lb (63.5 kg)   SpO2 95%   BMI 24.80 kg/m    Physical Exam  Constitutional: He is oriented to person, place, and time. He appears well-developed and well-nourished.  HENT:  Head: Normocephalic and atraumatic.  Eyes: EOM are normal.  Neck: Normal range of motion.  Cardiovascular: Normal rate, regular rhythm, normal heart sounds and intact distal pulses.   Pulmonary/Chest: Effort normal and breath sounds normal. No respiratory distress.  Abdominal: Soft. He exhibits no distension. There is no tenderness.  Musculoskeletal: Normal range of motion.  Neurological: He is alert and oriented to person, place, and time.  Skin: Skin is warm and dry.  Psychiatric: He has a normal mood and affect. Judgment normal.  Nursing note and vitals reviewed.    ED Treatments / Results  DIAGNOSTIC STUDIES: Oxygen Saturation is 95% on RA, adequate by my interpretation.  COORDINATION OF CARE:  3:13 AM Discussed treatment plan with pt at bedside and pt agreed to plan.  Labs (all labs ordered are listed, but only abnormal results are displayed) Labs Reviewed  CBC WITH DIFFERENTIAL/PLATELET - Abnormal; Notable for the following:       Result Value   RBC 4.05 (*)    Hemoglobin 9.5 (*)    HCT 32.7 (*)    MCH 23.5 (*)    MCHC 29.1 (*)    RDW 17.5 (*)    All other components within normal limits  BASIC METABOLIC PANEL - Abnormal; Notable for the following:    Glucose, Bld 106 (*)    Calcium 8.5 (*)    All other components within normal limits  I-STAT CHEM 8, ED - Abnormal; Notable for the following:    BUN 21 (*)    Glucose, Bld 101 (*)    Calcium, Ion 1.11 (*)    Hemoglobin 10.5 (*)    HCT 31.0 (*)    All other components within normal limits  MAGNESIUM  PHOSPHORUS  I-STAT TROPOININ, ED    EKG  EKG Interpretation  Date/Time:  Thursday April 25 2016 01:51:40 EDT Ventricular Rate:  111 PR Interval:    QRS Duration: 226 QT Interval:  479 QTC Calculation: 652 R Axis:   9 Text Interpretation:  AV dissociation IVCD, consider  atypical LBBB Discordant elevation in III, avf No active CP Confirmed by HORTON  MD, COURTNEY ([id number]) on 04/25/2016 2:19:09 AM       Radiology No results found.  Procedures Procedures (including critical care time)  Medications Ordered in ED Medications - No data to display   Initial Impression / Assessment and Plan / ED Course  I have reviewed the triage vital signs and the nursing notes.  Pertinent labs & imaging results that were available during my care of the patient were reviewed by me and considered in my medical decision making (see chart for details).     VT with appropriate ICD shocks. Will consult cardiology given hx of VT storm in the past. May need medication adjustment  Final Clinical Impressions(s) / ED Diagnoses   Final diagnoses:  None   I personally performed the services described in this documentation, which was scribed in my presence. The recorded information has been reviewed and is accurate.     New Prescriptions New Prescriptions   No medications on file     Azalia Bilis, MD 04/25/16 [phone]

## 2016-04-29 ENCOUNTER — Ambulatory Visit (HOSPITAL_COMMUNITY)
Admission: RE | Admit: 2016-04-29 | Discharge: 2016-04-30 | Disposition: A | Discharge status: Home / Self Care | Payer: Medicare Other | Source: Ambulatory Visit | Attending: Internal Medicine | Admitting: Internal Medicine

## 2016-04-29 ENCOUNTER — Encounter (HOSPITAL_COMMUNITY): Payer: Self-pay | Admitting: *Deleted

## 2016-04-29 ENCOUNTER — Encounter (HOSPITAL_COMMUNITY)
Admission: RE | Disposition: A | Discharge status: Home / Self Care | Payer: Self-pay | Source: Ambulatory Visit | Attending: Internal Medicine

## 2016-04-29 DIAGNOSIS — Z95810 Presence of automatic (implantable) cardiac defibrillator: Secondary | ICD-10-CM | POA: Insufficient documentation

## 2016-04-29 DIAGNOSIS — Z7901 Long term (current) use of anticoagulants: Secondary | ICD-10-CM | POA: Insufficient documentation

## 2016-04-29 DIAGNOSIS — I252 Old myocardial infarction: Secondary | ICD-10-CM | POA: Insufficient documentation

## 2016-04-29 DIAGNOSIS — I482 Chronic atrial fibrillation: Secondary | ICD-10-CM | POA: Insufficient documentation

## 2016-04-29 DIAGNOSIS — Z881 Allergy status to other antibiotic agents status: Secondary | ICD-10-CM | POA: Insufficient documentation

## 2016-04-29 DIAGNOSIS — Z888 Allergy status to other drugs, medicaments and biological substances status: Secondary | ICD-10-CM | POA: Insufficient documentation

## 2016-04-29 DIAGNOSIS — J449 Chronic obstructive pulmonary disease, unspecified: Secondary | ICD-10-CM | POA: Diagnosis not present

## 2016-04-29 DIAGNOSIS — I5022 Chronic systolic (congestive) heart failure: Secondary | ICD-10-CM | POA: Diagnosis not present

## 2016-04-29 DIAGNOSIS — I2510 Atherosclerotic heart disease of native coronary artery without angina pectoris: Secondary | ICD-10-CM | POA: Diagnosis not present

## 2016-04-29 DIAGNOSIS — I472 Ventricular tachycardia, unspecified: Secondary | ICD-10-CM

## 2016-04-29 DIAGNOSIS — Z79899 Other long term (current) drug therapy: Secondary | ICD-10-CM | POA: Insufficient documentation

## 2016-04-29 DIAGNOSIS — F419 Anxiety disorder, unspecified: Secondary | ICD-10-CM | POA: Diagnosis not present

## 2016-04-29 DIAGNOSIS — Z880 Allergy status to penicillin: Secondary | ICD-10-CM | POA: Insufficient documentation

## 2016-04-29 DIAGNOSIS — Z87891 Personal history of nicotine dependence: Secondary | ICD-10-CM | POA: Diagnosis not present

## 2016-04-29 DIAGNOSIS — I110 Hypertensive heart disease with heart failure: Secondary | ICD-10-CM | POA: Diagnosis not present

## 2016-04-29 HISTORY — PX: V TACH ABLATION: EP1227

## 2016-04-29 LAB — POCT ACTIVATED CLOTTING TIME
ACTIVATED CLOTTING TIME: 197 s
ACTIVATED CLOTTING TIME: 186 s
Activated Clotting Time: 197 seconds
Activated Clotting Time: 202 seconds
Activated Clotting Time: 213 seconds
Activated Clotting Time: 219 seconds
Activated Clotting Time: 213 seconds

## 2016-04-29 SURGERY — V TACH ABLATION

## 2016-04-29 MED ORDER — MIDAZOLAM HCL 5 MG/5ML IJ SOLN
INTRAMUSCULAR | Status: AC
  Filled 2016-04-29: qty 5

## 2016-04-29 MED ORDER — FENTANYL CITRATE (PF) 100 MCG/2ML IJ SOLN
INTRAMUSCULAR | Status: AC
  Filled 2016-04-29: qty 2

## 2016-04-29 MED ORDER — HEPARIN SODIUM (PORCINE) 1000 UNIT/ML IJ SOLN
INTRAMUSCULAR | Status: DC | PRN
  Administered 2016-04-29: 2000 [IU] via INTRAVENOUS
  Administered 2016-04-29: 6000 [IU] via INTRAVENOUS
  Administered 2016-04-29 (×3): 2000 [IU] via INTRAVENOUS

## 2016-04-29 MED ORDER — HEPARIN (PORCINE) IN NACL 2-0.9 UNIT/ML-% IJ SOLN
INTRAMUSCULAR | Status: AC
  Filled 2016-04-29: qty 500

## 2016-04-29 MED ORDER — PROMETHAZINE HCL 25 MG PO TABS: 25.0000 mg | ORAL_TABLET | Freq: Four times a day (QID) | ORAL | Status: DC | PRN

## 2016-04-29 MED ORDER — BUPIVACAINE HCL (PF) 0.25 % IJ SOLN
INTRAMUSCULAR | Status: AC
  Filled 2016-04-29: qty 60

## 2016-04-29 MED ORDER — HEPARIN SODIUM (PORCINE) 1000 UNIT/ML IJ SOLN
INTRAMUSCULAR | Status: AC
  Filled 2016-04-29: qty 1

## 2016-04-29 MED ORDER — NITROGLYCERIN 0.4 MG SL SUBL
0.4000 mg | SUBLINGUAL_TABLET | SUBLINGUAL | Status: DC | PRN
Start: 2016-04-29 — End: 2016-04-30

## 2016-04-29 MED ORDER — SODIUM CHLORIDE 0.9 % IV SOLN: 250.0000 mL | INTRAVENOUS | Status: DC | PRN

## 2016-04-29 MED ORDER — BENZONATATE 100 MG PO CAPS: 200.0000 mg | ORAL_CAPSULE | ORAL | Status: DC | PRN

## 2016-04-29 MED ORDER — BUPIVACAINE HCL (PF) 0.25 % IJ SOLN
INTRAMUSCULAR | Status: DC | PRN
  Administered 2016-04-29: 45 mL

## 2016-04-29 MED ORDER — MIDAZOLAM HCL 5 MG/5ML IJ SOLN
INTRAMUSCULAR | Status: DC | PRN
  Administered 2016-04-29 (×7): 1 mg via INTRAVENOUS
  Administered 2016-04-29: 2 mg via INTRAVENOUS
  Administered 2016-04-29 (×7): 1 mg via INTRAVENOUS
  Administered 2016-04-29 (×2): 2 mg via INTRAVENOUS
  Administered 2016-04-29: 1 mg via INTRAVENOUS

## 2016-04-29 MED ORDER — SODIUM CHLORIDE 0.9% FLUSH: 3.0000 mL | INTRAVENOUS | Status: DC | PRN

## 2016-04-29 MED ORDER — ALPRAZOLAM 0.25 MG PO TABS
0.2500 mg | ORAL_TABLET | Freq: Three times a day (TID) | ORAL | Status: DC | PRN
  Administered 2016-04-30: 0.25 mg via ORAL
  Filled 2016-04-29: qty 1

## 2016-04-29 MED ORDER — ONDANSETRON HCL 4 MG/2ML IJ SOLN: 4.0000 mg | Freq: Four times a day (QID) | INTRAMUSCULAR | Status: DC | PRN

## 2016-04-29 MED ORDER — APIXABAN 2.5 MG PO TABS
2.5000 mg | ORAL_TABLET | Freq: Two times a day (BID) | ORAL | Status: DC
  Administered 2016-04-30: 2.5 mg via ORAL
  Filled 2016-04-29: qty 1

## 2016-04-29 MED ORDER — SODIUM CHLORIDE 0.9% FLUSH
3.0000 mL | Freq: Two times a day (BID) | INTRAVENOUS | Status: DC
  Administered 2016-04-29 – 2016-04-30 (×2): 3 mL via INTRAVENOUS

## 2016-04-29 MED ORDER — HYDROCODONE-ACETAMINOPHEN 5-325 MG PO TABS: 1.0000 | ORAL_TABLET | Freq: Four times a day (QID) | ORAL | Status: DC | PRN

## 2016-04-29 MED ORDER — ACETAMINOPHEN 325 MG PO TABS: 650.0000 mg | ORAL_TABLET | ORAL | Status: DC | PRN

## 2016-04-29 MED ORDER — FUROSEMIDE 20 MG PO TABS
20.0000 mg | ORAL_TABLET | ORAL | Status: DC
  Administered 2016-04-29: 20 mg via ORAL
  Filled 2016-04-29 (×2): qty 1

## 2016-04-29 MED ORDER — GUAIFENESIN ER 600 MG PO TB12
600.0000 mg | ORAL_TABLET | Freq: Two times a day (BID) | ORAL | Status: DC
  Administered 2016-04-29 – 2016-04-30 (×2): 600 mg via ORAL
  Filled 2016-04-29 (×2): qty 1

## 2016-04-29 MED ORDER — HEPARIN (PORCINE) IN NACL 2-0.9 UNIT/ML-% IJ SOLN
INTRAMUSCULAR | Status: DC | PRN
  Administered 2016-04-29 (×2): 1000 mL

## 2016-04-29 MED ORDER — ACETAMINOPHEN 325 MG PO TABS: 650.0000 mg | ORAL_TABLET | Freq: Four times a day (QID) | ORAL | Status: DC | PRN

## 2016-04-29 MED ORDER — METAXALONE 800 MG PO TABS
800.0000 mg | ORAL_TABLET | Freq: Every day | ORAL | Status: DC | PRN
Start: 2016-04-29 — End: 2016-04-30
  Filled 2016-04-29: qty 1

## 2016-04-29 MED ORDER — MEXILETINE HCL 150 MG PO CAPS
300.0000 mg | ORAL_CAPSULE | Freq: Three times a day (TID) | ORAL | Status: DC
  Administered 2016-04-29 – 2016-04-30 (×2): 300 mg via ORAL
  Filled 2016-04-29 (×4): qty 2

## 2016-04-29 MED ORDER — RANOLAZINE ER 500 MG PO TB12
500.0000 mg | ORAL_TABLET | Freq: Two times a day (BID) | ORAL | Status: DC
  Administered 2016-04-29 – 2016-04-30 (×2): 500 mg via ORAL
  Filled 2016-04-29 (×2): qty 1

## 2016-04-29 MED ORDER — FENTANYL CITRATE (PF) 100 MCG/2ML IJ SOLN
INTRAMUSCULAR | Status: DC | PRN
  Administered 2016-04-29 (×5): 12.5 ug via INTRAVENOUS
  Administered 2016-04-29: 25 ug via INTRAVENOUS
  Administered 2016-04-29 (×4): 12.5 ug via INTRAVENOUS
  Administered 2016-04-29: 25 ug via INTRAVENOUS
  Administered 2016-04-29 (×4): 12.5 ug via INTRAVENOUS
  Administered 2016-04-29: 25 ug via INTRAVENOUS
  Administered 2016-04-29: 12.5 ug via INTRAVENOUS

## 2016-04-29 MED ORDER — METOPROLOL SUCCINATE ER 25 MG PO TB24
25.0000 mg | ORAL_TABLET | Freq: Every day | ORAL | Status: DC
  Administered 2016-04-30: 25 mg via ORAL
  Filled 2016-04-29: qty 1

## 2016-04-29 SURGICAL SUPPLY — 13 items
BAG SNAP BAND KOVER 36X36 (MISCELLANEOUS) ×2 IMPLANT
CATH JOSEPHSON QUAD-ALLRED 6FR (CATHETERS) ×2 IMPLANT
CATH SMTCH THERMOCOOL SF DF (CATHETERS) ×2 IMPLANT
CATH WEBSTER BI DIR CS D-F CRV (CATHETERS) ×2 IMPLANT
PACK EP LATEX FREE (CUSTOM PROCEDURE TRAY) ×3
PACK EP LF (CUSTOM PROCEDURE TRAY) IMPLANT
PAD DEFIB LIFELINK (PAD) ×2 IMPLANT
PATCH CARTO3 (PAD) ×2 IMPLANT
SHEATH PINNACLE 6F 10CM (SHEATH) ×4 IMPLANT
SHEATH PINNACLE 7F 10CM (SHEATH) ×2 IMPLANT
SHEATH PINNACLE 8F 10CM (SHEATH) ×6 IMPLANT
SHIELD RADPAD SCOOP 12X17 (MISCELLANEOUS) ×2 IMPLANT
TUBING SMART ABLATE COOLFLOW (TUBING) ×2 IMPLANT

## 2016-04-29 NOTE — Interval H&P Note (Signed)
History and Physical Interval Note:  04/29/2016 12:45 PM  [name]  has presented today for surgery, with the diagnosis of vt  The various methods of treatment have been discussed with the patient and family. After consideration of risks, benefits and other options for treatment, the patient has consented to  Procedure(s): V Tach Ablation (N/A) as a surgical intervention .  The patient's history has been reviewed, patient examined, no change in status, stable for surgery.  I have reviewed the patient's chart and labs.  Questions were answered to the patient's satisfaction.     Lewayne Bunting

## 2016-04-29 NOTE — Progress Notes (Signed)
Patient admitted S/P Ablation. Alert but very sleepy. Responds to touch and voice. CCMD called and telemetry applied. Spouse at the bedside.

## 2016-04-29 NOTE — Progress Notes (Addendum)
Site area: Right groin a 8, 7 venous sheath and 8 french arterial sheath was removed  Site Prior to Removal:  Level 0  Pressure Applied For 30 MINUTES    Bedresty Beginning at 2000p  Manual:   Yes.    Patient Status During Pull:  stable  Post Pull Groin Site:  Level 0  Post Pull Instructions Given:  Yes.    Post Pull Pulses Present:  Yes.    Dressing Applied:  Yes.  Pressure dressing applied  Comments:  VS remain stable during sheath pull.

## 2016-04-29 NOTE — H&P (View-Only) (Signed)
HPI [name] presents to the ED with more VT. He is a 71 year old man with an ischemic cardiomyopathy, chronic systolic heart failure, class III, chronic atrial fibrillation, previously unwilling to take any anticoagulation, ventricular tachycardia, status post ICD implantation. He was in the hospital several months ago with VT storm but ultimately developed amio lung toxicity and had to be switched to mexitil. He is also on ranexa. He has been bothered by hypotension and had to have his losartan stopped. He denies chest pain. He does not have palpitations. He has been defibrillated a couple of times this month. Last night he was getting ready for bed and felt his heart racing and received a couple of ICD shocks. On presentation to the ED he was in a slow VT at 120/min. He reverted back to NSR. He denies missing any of his meds. His CHF symptoms have been fairly well controlled, maybe improved and he has not had atrial fib, despite coming off of amiodarone.      Allergies  Allergen Reactions  . Ace Inhibitors Other (See Comments)    muscle pain. Tolerates ARBs.   . Codeine Other (See Comments)    "head wants to explode."  . Doxycycline Diarrhea and Nausea And Vomiting  . Kionex [Sodium Polystyrene Sulfonate] Other (See Comments)    SOB, pressure in chest, weakness fatigue.   [name] Penicillins Swelling    "started at point of injection; w/in 3 min my upper arm was swollen 3 times normal" Has patient had a PCN reaction causing immediate rash, facial/tongue/throat swelling, SOB or lightheadedness with hypotension: Yes Has patient had a PCN reaction causing severe rash involving mucus membranes or skin necrosis: No Has patient had a PCN reaction that required hospitalization No Has patient had a PCN reaction occurring within the last 10 years: No If all of the above answers are "NO", then may proceed wi  . Lisinopril Other (See Comments)    Muscle Pain  . Statins Other (See Comments)     Myalgias per patient  . Lidocaine     Hallucinations, jerking  . Pacerone [Amiodarone] Other (See Comments)    Lung and heart problem           Current Outpatient Prescriptions  Medication Sig Dispense Refill  . acetaminophen (TYLENOL) 325 MG tablet Take 2 tablets (650 mg total) by mouth every 6 (six) hours as needed for mild pain (or Fever >/= 101).    [name] ALPRAZolam (XANAX) 0.25 MG tablet TAKE 1 TABLET BY MOUTH 3 TIMES DAILY AS NEEDED FOR ANXIETY OR SLEEP 30 tablet 1  . benzonatate (TESSALON) 200 MG capsule Take 1 capsule (200 mg total) by mouth every 4 (four) hours as needed for cough. 20 capsule 0  . Chlorpheniramine-DM (CORICIDIN HBP COUGH/COLD PO) Take 2 capsules by mouth daily as needed (cold).     . furosemide (LASIX) 20 MG tablet Take 1 tablet (20 mg total) by mouth every Monday, Wednesday, and Friday. 45 tablet 3  . guaiFENesin (MUCINEX) 600 MG 12 hr tablet Take 600 mg by mouth 2 (two) times daily.     [name] HYDROcodone-acetaminophen (NORCO/VICODIN) 5-325 MG tablet Take 1 tablet by mouth every 6 (six) hours as needed for moderate pain.    [name] loperamide (IMODIUM) 2 MG capsule Take 1 capsule (2 mg total) by mouth as needed for diarrhea or loose stools. (Patient taking differently: Take 4 mg by mouth See admin instructions. Take 2 tablets after each bowl movement.) 30 capsule 0  . metaxalone (  SKELAXIN) 800 MG tablet Take 1 tablet (800 mg total) by mouth daily as needed for muscle spasms. 30 tablet 1  . metoprolol succinate (TOPROL XL) 25 MG 24 hr tablet Take 1 tablet (25 mg total) by mouth daily. 90 tablet 3  . mexiletine (MEXITIL) 200 MG capsule Take 1 capsule (200 mg total) by mouth every 8 (eight) hours. 270 capsule 3  . nitroGLYCERIN (NITROSTAT) 0.4 MG SL tablet Place 1 tablet (0.4 mg total) under the tongue every 5 (five) minutes as needed for chest pain. 25 tablet 1  . NON FORMULARY Oxygen 3 liters   24/7    . promethazine (PHENERGAN) 25 MG tablet Take 25 mg by mouth  every 6 (six) hours as needed for nausea or vomiting.    [name] RANEXA 500 MG 12 hr tablet TAKE 1 TABLET (500 MG TOTAL) BY MOUTH 2 (TWO) TIMES DAILY. 180 tablet 3  . ranitidine (ZANTAC) 150 MG capsule Take 150 mg by mouth daily as needed for heartburn.     . Simethicone (GAS-X PO) Take by mouth as needed (Take as directed).     [name] apixaban (ELIQUIS) 2.5 MG TABS tablet Take 1 tablet (2.5 mg total) by mouth 2 (two) times daily. 180 tablet 3   No current facility-administered medications for this visit.             Facility-Administered Medications Ordered in Other Visits  Medication Dose Route Frequency Provider Last Rate Last Dose  . sodium chloride 0.9 % injection 3 mL  3 mL Intravenous Q12H Marinus Maw, MD      . sodium chloride 0.9 % injection 3 mL  3 mL Intravenous PRN Marinus Maw, MD             Past Medical History:  Diagnosis Date  . Acute lower GI bleeding 12/11/2011   "first time" (12/11/2011)  . Acute myocardial infarction, unspecified site, episode of care unspecified 1995   Pt living in [place], no stent, ?PTCA  . AICD (automatic cardioverter/defibrillator) present   . Allergic rhinitis, cause unspecified   . Anxiety   . Arthritis    "all over" (11/07/2015)  . Atrial fibrillation (HCC)    a. 05/2015 - converted to sinus in setting of ICD shocks; placed on eliquis 5 bid.  [name] CAD (coronary artery disease), autologous vein bypass graft   . Cervical herniated disc    told not to lift >10 lbs  . Chronic systolic CHF (congestive heart failure), NYHA class 2 (HCC)    Reports EF of 25%.   [name] COPD (chronic obstructive pulmonary disease) (HCC) 11/2012   by xray  . Diverticulosis    by CT scan  . HCAP (healthcare-associated pneumonia) 11/06/2015  . Hypertension   . Insomnia   . Paroxysmal ventricular tachycardia (HCC)   . Perennial allergic rhinitis    only to dust mites  . Pneumonia 2000s   "walking pneumonia"  . VT (ventricular tachycardia)  (HCC)    a. 05/2015 - VT storm with multiple ICD shocks-->Amio 400 BID.    ROS:   All systems reviewed and negative except as noted in the HPI.        Past Surgical History:  Procedure Laterality Date  . CARDIAC CATHETERIZATION  2004   LAD 30%, D1 30%, CFX-AV groove 70-80%, OM1 30%, EF 20-25%  . CARDIAC CATHETERIZATION N/A 09/28/2015   Procedure: Left Heart Cath and Coronary Angiography;  Surgeon: Peter M Swaziland, MD;  Location: [facility] INVASIVE CV LAB;  Service: Cardiovascular;  Laterality: N/A;  . CARDIAC DEFIBRILLATOR PLACEMENT  2004  . CATARACT EXTRACTION W/ INTRAOCULAR LENS IMPLANT Right 01/2012  . COLONOSCOPY  01/08/2012   Procedure: COLONOSCOPY;  Surgeon: Iva Boop, MD;  Location: [facility] ENDOSCOPY;  Service: Endoscopy;  Laterality: N/A;  . CORONARY ANGIOPLASTY  1995   Pt thinks he got a balloon, living in [place], [place]  . IMPLANTABLE CARDIOVERTER DEFIBRILLATOR GENERATOR CHANGE N/A 02/07/2012   Procedure: IMPLANTABLE CARDIOVERTER DEFIBRILLATOR GENERATOR CHANGE;  Surgeon: Marinus Maw, MD; Medtronic Evera XT VR single-chamber serial number [id number], Laterality: Left  . INSERT / REPLACE / REMOVE PACEMAKER  2004   Medtronic ICD  . KNEE ARTHROSCOPY Left 05/2003   Hattie Perch 06/19/2010  . LAPAROSCOPIC CHOLECYSTECTOMY  1/ 2012  . SHOULDER ARTHROSCOPY W/ ROTATOR CUFF REPAIR Right twice  . TONSILLECTOMY AND ADENOIDECTOMY  ~ 1951           Family History  Problem Relation Age of Onset  . Diabetes Father   . Tracheal cancer Father 74    smoker  . Stroke Mother   . Cancer Sister     left eye  . CAD Neg Hx   . Colon cancer Neg Hx   . Prostate cancer Neg Hx      Social History        Social History  . Marital status: Married    Spouse name: N/A  . Number of children: 0  . Years of education: N/A       Occupational History  . [employer] truck driver (retired)         Social History Main Topics  . Smoking status: Former Smoker     Packs/day: 0.50    Years: 50.00    Types: Cigarettes, Cigars    Quit date: 09/27/2015  . Smokeless tobacco: Never Used  . Alcohol use No  . Drug use: No  . Sexual activity: Not Currently       Other Topics Concern  . Not on file      Social History Narrative   Lives with wife, married 1998   Grown children, 2 great grandchildren   Occupation: retired, was Presenter, broadcasting   Activity: walking, fishing   Diet: good water daily, fruits/vegetables rare      Wife is Product manager.    [id number], Human resources officer. No known agent orange exposure.       BP 116/68   Pulse 75   Ht 5\' 3"  (1.6 m)   Wt 145 lb (65.8 kg)   SpO2 94%   BMI 25.69 kg/m   Physical Exam:  Chronically ill appearing 71 yo man, NAD HEENT: Unremarkable Neck:  6 cm JVD, no thyromegally Lungs:  Clear except for rales in the bases. No wheezes or rhonchi. Well-healed ICD incision. HEART:  Regular brady rhythm, no murmurs, no rubs, no clicks Abd:  soft, positive bowel sounds, no organomegally, no rebound, no guarding Ext:  2 plus pulses, no edema, no cyanosis, no clubbing Skin:  No rashes no nodules Neuro:  CN II through XII intact, motor grossly intact   DEVICE  Normal device function.  See PaceArt for details. Optivol is stable. He has had several episodes of VT at 190/min with multiple unsuccessful ATP's.  Assess/Plan:  1. VT - we will uptitrate his mexitil to 300 mg tid. I will have him return in 4 days for VT ablation. I have discussed the indications, risks/benefits/goals/expectations of the procedure and he wishes to proceed. 2. Chronic systolic  heart failure - his symptoms are controlled. No change in meds. He is improved. 3. ICD - his Medtronic device is working normally. Will recheck in several months.  4. Atrial fib - he is at risk for stroke. He is tolerating his Eliquis for thromboembolic prevention but is having lots of skin bleeding and insists on his dose being reduced to 2.5 mg twice  daily. His Eliquis will be held prior to the ablation for a day.  [name].D.

## 2016-04-29 NOTE — Progress Notes (Signed)
Patient is having frequent VTach but asymptomatic. EKG done with Critical result, showing Right BBB. RRT called. Dr. Jearld Pies on call paged. [place] called back and asked to continue monitor for any symptoms. Patient is asymptomatic at this time. Will continue to monitor.

## 2016-04-29 NOTE — Discharge Instructions (Signed)
No driving for 6 months.  °No lifting over 5 lbs for 1 week. No vigorous or sexual activity for 1 week. Keep procedure site clean & dry. If you notice increased pain, swelling, bleeding or pus, call/return!  You may shower, but no soaking baths/hot tubs/pools for 1 week.  ° ° °

## 2016-04-29 NOTE — Discharge Summary (Signed)
ELECTROPHYSIOLOGY PROCEDURE DISCHARGE SUMMARY    Patient ID: CURT OATIS,  MRN: [id number], DOB/AGE: 72/28/1947 72 y.o.  Admit date: 04/29/2016 Discharge date: 04/30/16  Primary Care Physician: Crawford Givens, MD  Primary Cardiologist/Electrophysiologist: Dr. [name]  Primary Discharge Diagnosis:  1. VT     Hx of amiodarone tox  Patient is aware no driving 6 months s/p ICD therapies/VT on 04/25/16  Secondary Discharge Diagnosis:  1. ICM w/ICD 2. CAD 3. Chronic CHF (systolic) 4. Chronic AFib     CHA2DS2Vasc is 4, on Eliquis     Patient refused full dose Eliquis 5. HTN   Allergies  Allergen Reactions  . Ace Inhibitors Other (See Comments)    muscle pain. Tolerates ARBs.   . Codeine Other (See Comments)    "head wants to explode."  . Doxycycline Diarrhea and Nausea And Vomiting  . Kionex [Sodium Polystyrene Sulfonate] Other (See Comments)    SOB, pressure in chest, weakness fatigue.   [name] Penicillins Swelling    "started at point of injection; w/in 3 min my upper arm was swollen 3 times normal" Has patient had a PCN reaction causing immediate rash, facial/tongue/throat swelling, SOB or lightheadedness with hypotension: Yes Has patient had a PCN reaction causing severe rash involving mucus membranes or skin necrosis: No Has patient had a PCN reaction that required hospitalization No Has patient had a PCN reaction occurring within the last 10 years: No If all of the above answers are "NO", then may proceed wi  . Lisinopril Other (See Comments)    Muscle Pain  . Statins Other (See Comments)    Myalgias per patient  . Lidocaine     Hallucinations, jerking  . Pacerone [Amiodarone] Other (See Comments)    Lung and heart problem     Procedures This Admission: 1.  Electrophysiology study and radiofrequency catheter ablation on 04/29/16 by Dr [name].  This study demonstrated Conclusion  Conclusion: This study demonstrates inducible ventricular tachycardia. The  patient's clinical ventricular tachycardia was not attempted for ablation. However because of the patient's multiple additional ventricular tachycardia is demonstrated on his ICD interrogation, and because of multiple eye ICD shocks for very fast VT's in the past, multiple RV energy applications were delivered to regions of transition from electrical scar to healthy tissue. The patient's inducible ventricular tachycardia was not targeted for ablation because of the very close proximity to the His bundle region and almost certain development of complete heart block with radiofrequency energy application  There were no early apparent complications.    Brief HPI: [name] is a 71 y.o. male with a past medical history as outlined above.  He has had recurrent VT w/multiple ICD therapies.  They have failed medical therapy with amiodarone, mexiletine, Quinidine.  Risks, benefits, and alternatives to ablation were reviewed with the patient who wished to proceed.    Hospital Course:  The patient was admitted and underwent EPS/RFCA with details as outlined above. He was monitored on telemetry overnight which demonstrated SR with intermittent slow VT that was asymptomatic.  He did have some anxiety last night though did well with xanax, and feels well this morning.  Groin/procedure site was without complication.  The patient was examined by Dr. [name] and considered stable for discharge to home, he has ambulated in the hallway with the RN and without difficulty, procedure site remained stable.  Continue same home medicines.  Follow up will be arranged in 4 weeks.  Wound care and restrictions were reviewed with  the patient prior to discharge.   Physical Exam: Vitals:   04/29/16 2117 04/29/16 2131 04/29/16 2349 04/30/16 0501  BP: 120/65 113/66 (!) 134/110 98/60  Pulse: 67 66  61  Resp: 16 16 17 16   Temp:    97.8 F (36.6 C)  TempSrc:    Oral  SpO2: 97% 94% 96% 92%  Weight:      Height:         GEN- The patient is well appearing, alert and oriented x 3 today.   HEENT: normocephalic, atraumatic; sclera clear, conjunctiva pink; hearing intact; oropharynx clear; neck supple, no JVP Lymph- no cervical lymphadenopathy Lungs- CTA b/l, normal work of breathing.  No wheezes, rales, rhonchi Heart- RRR, no murmurs, rubs or gallops, PMI not laterally displaced GI- soft, non-tender, non-distended Extremities- no clubbing, cyanosis, or edema; DP/PT/radial pulses 2+ bilaterally, groin without hematoma/bruit MS- no significant deformity or atrophy Skin- warm and dry, no rash or lesion Psych- euthymic mood, full affect Neuro- strength and sensation are intact   Discharge Vitals: BP 98/60 (BP Location: Right Arm)   Pulse 61   Temp 97.8 F (36.6 C) (Oral)   Resp 16   Ht 5\' 3"  (1.6 m)   Wt 143 lb (64.9 kg)   SpO2 92%   BMI 25.33 kg/m    Labs:   Lab Results  Component Value Date   WBC 8.8 04/25/2016   HGB 10.5 (L) 04/25/2016   HCT 31.0 (L) 04/25/2016   MCV 80.7 04/25/2016   PLT 347 04/25/2016     Recent Labs Lab 04/25/16 0220 04/25/16 0304  NA 139 140  K 4.2 4.1  CL 105 108  CO2 23  --   BUN 19 21*  CREATININE 1.10 1.10  CALCIUM 8.5*  --   GLUCOSE 106* 101*    Discharge Medications:  Allergies as of 04/30/2016      Reactions   Ace Inhibitors Other (See Comments)   muscle pain. Tolerates ARBs.    Codeine Other (See Comments)   "head wants to explode."   Doxycycline Diarrhea, Nausea And Vomiting   Kionex [sodium Polystyrene Sulfonate] Other (See Comments)   SOB, pressure in chest, weakness fatigue.   Penicillins Swelling   "started at point of injection; w/in 3 min my upper arm was swollen 3 times normal" Has patient had a PCN reaction causing immediate rash, facial/tongue/throat swelling, SOB or lightheadedness with hypotension: Yes Has patient had a PCN reaction causing severe rash involving mucus membranes or skin necrosis: No Has patient had a PCN reaction  that required hospitalization No Has patient had a PCN reaction occurring within the last 10 years: No If all of the above answers are "NO", then may proceed wi   Lisinopril Other (See Comments)   Muscle Pain   Statins Other (See Comments)   Myalgias per patient   Lidocaine    Hallucinations, jerking   Pacerone [amiodarone] Other (See Comments)   Lung and heart problem      Medication List    TAKE these medications   acetaminophen 325 MG tablet Commonly known as:  TYLENOL Take 2 tablets (650 mg total) by mouth every 6 (six) hours as needed for mild pain (or Fever >/= 101).   ALPRAZolam 0.25 MG tablet Commonly known as:  XANAX TAKE 1 TABLET BY MOUTH 3 TIMES DAILY AS NEEDED FOR ANXIETY OR SLEEP   apixaban 2.5 MG Tabs tablet Commonly known as:  ELIQUIS Take 1 tablet (2.5 mg total) by mouth 2 (two)  times daily.   benzonatate 200 MG capsule Commonly known as:  TESSALON Take 1 capsule (200 mg total) by mouth every 4 (four) hours as needed for cough.   CORICIDIN HBP COUGH/COLD PO Take 2 capsules by mouth daily as needed (cold).   furosemide 20 MG tablet Commonly known as:  LASIX Take 1 tablet (20 mg total) by mouth every Monday, Wednesday, and Friday.   guaiFENesin 600 MG 12 hr tablet Commonly known as:  MUCINEX Take 600 mg by mouth 2 (two) times daily.   HYDROcodone-acetaminophen 5-325 MG tablet Commonly known as:  NORCO/VICODIN Take 1 tablet by mouth every 6 (six) hours as needed for moderate pain.   loperamide 2 MG capsule Commonly known as:  IMODIUM Take 1 capsule (2 mg total) by mouth as needed for diarrhea or loose stools. What changed:  how much to take  when to take this  additional instructions   metaxalone 800 MG tablet Commonly known as:  SKELAXIN Take 1 tablet (800 mg total) by mouth daily as needed for muscle spasms. Notes to patient:  Please see the original prescribing doctor regarding this medicine if needed.   metoprolol succinate 25 MG 24 hr  tablet Commonly known as:  TOPROL XL Take 1 tablet (25 mg total) by mouth daily.   mexiletine 150 MG capsule Commonly known as:  MEXITIL Take 2 capsules (300 mg total) by mouth every 8 (eight) hours.   nitroGLYCERIN 0.4 MG SL tablet Commonly known as:  NITROSTAT Place 1 tablet (0.4 mg total) under the tongue every 5 (five) minutes as needed for chest pain.   NON FORMULARY Oxygen 3 liters   24/7   promethazine 25 MG tablet Commonly known as:  PHENERGAN Take 25 mg by mouth every 6 (six) hours as needed for nausea or vomiting.   RANEXA 500 MG 12 hr tablet Generic drug:  ranolazine TAKE 1 TABLET (500 MG TOTAL) BY MOUTH 2 (TWO) TIMES DAILY.   ranitidine 150 MG capsule Commonly known as:  ZANTAC Take 150 mg by mouth daily as needed for heartburn.       Disposition:  Home Discharge Instructions    Diet - low sodium heart healthy    Complete by:  As directed    Increase activity slowly    Complete by:  As directed      Follow-up Information    Lewayne Bunting, MD Follow up on 05/28/2016.   Specialty:  Cardiology Why:  2:00PM Contact information: [address] [place] [address] [phone]           Duration of Discharge Encounter: Greater than 30 minutes including physician time.  SignedFrancis Dowse, PA-C 04/30/2016 10:06 AM  EP Attending  He had some problems with anxiety and sedation last night but appears to be back to his baseline. He also had some slow VT at 110/min. This was the VT which was very close to his AV conduction system and not targeted for ablation. His groin looks good. He will be allowed to be discharged home with usual followup. Continue outpatient meds as scheduled.   Leonia Reeves.D.

## 2016-04-30 ENCOUNTER — Encounter (HOSPITAL_COMMUNITY): Payer: Self-pay | Admitting: Internal Medicine

## 2016-04-30 DIAGNOSIS — I5022 Chronic systolic (congestive) heart failure: Secondary | ICD-10-CM | POA: Diagnosis not present

## 2016-04-30 DIAGNOSIS — Z880 Allergy status to penicillin: Secondary | ICD-10-CM | POA: Diagnosis not present

## 2016-04-30 DIAGNOSIS — I110 Hypertensive heart disease with heart failure: Secondary | ICD-10-CM | POA: Diagnosis not present

## 2016-04-30 DIAGNOSIS — J449 Chronic obstructive pulmonary disease, unspecified: Secondary | ICD-10-CM | POA: Diagnosis not present

## 2016-04-30 DIAGNOSIS — I2510 Atherosclerotic heart disease of native coronary artery without angina pectoris: Secondary | ICD-10-CM | POA: Diagnosis not present

## 2016-04-30 DIAGNOSIS — F419 Anxiety disorder, unspecified: Secondary | ICD-10-CM | POA: Diagnosis not present

## 2016-04-30 DIAGNOSIS — I252 Old myocardial infarction: Secondary | ICD-10-CM | POA: Diagnosis not present

## 2016-04-30 DIAGNOSIS — I482 Chronic atrial fibrillation: Secondary | ICD-10-CM | POA: Diagnosis not present

## 2016-04-30 DIAGNOSIS — Z7901 Long term (current) use of anticoagulants: Secondary | ICD-10-CM | POA: Diagnosis not present

## 2016-04-30 DIAGNOSIS — Z79899 Other long term (current) drug therapy: Secondary | ICD-10-CM | POA: Diagnosis not present

## 2016-04-30 DIAGNOSIS — Z881 Allergy status to other antibiotic agents status: Secondary | ICD-10-CM | POA: Diagnosis not present

## 2016-04-30 DIAGNOSIS — I472 Ventricular tachycardia: Secondary | ICD-10-CM | POA: Diagnosis not present

## 2016-04-30 MED ORDER — LOPERAMIDE HCL 2 MG PO CAPS
2.0000 mg | ORAL_CAPSULE | ORAL | Status: DC | PRN
  Administered 2016-04-30: 2 mg via ORAL
  Filled 2016-04-30: qty 1

## 2016-04-30 NOTE — Progress Notes (Signed)
Discharge instructions given. Pt verbalized understanding and all questions were answered.  

## 2016-04-30 NOTE — Progress Notes (Signed)
Patient not compliant with bedrest. Keep moving up and down in the bed, getting HR up. Nurse educated patient and spouse the rational for the bedrest.

## 2016-04-30 NOTE — Progress Notes (Signed)
Patient is become verbally abusive and rude and wants to see the Doctor right away. Attempting to explain to patient that heart rate is WNL and that MD is aware. Requested for Xanax, visibly anxious. Patient did not want to listen. Dr. On call paged again. Dr. Shirlee Latch said it does not see why he wants to see him and he is not able to come. Nurse will continue to monitor.

## 2016-05-01 ENCOUNTER — Telehealth: Payer: Self-pay | Admitting: Physician Assistant

## 2016-05-01 NOTE — Telephone Encounter (Signed)
Patient contacted after hour answering service. He was recently discharged 2 days ago after VT ablation. He says he has been having some dizziness for the past 30 minutes. He measured his blood pressure with systolic blood pressure of 100. His heart rate was 75.  I instructed the patient to drink a lot but more fluid see if that will help. However if he was to have worsening dizziness and feeling of passing out, he understands he will need to call 911 and the come to the ED via EMS for additional workup. Apparently he was having slow VT during the last hospitalization. My concern is if he has a failed VT ablation and continued to have episodic VT, it could potentially make him dizzy. However based on notes obtained during recent hospitalization, it appears when he is in a slow VT his heart rate is usually in the 110s and he says his heart rate today is 75. I further instructed the patient, if his condition deteriorate, he should not be coming to the hospital via private vehicle especially if he is having recurrent VT. If his symptom become better, he can call our office try to arrange an earlier follow-up. If he does have symptoms during the normal office hour, he can potentially coming to the clinic to have an EKG. I have asked him to send in a remote transmission today.   Ramond Dial PA Pager: [phone]

## 2016-05-01 NOTE — Telephone Encounter (Signed)
I have called [employer] in order to see if they can pull up the remote transmission, unfortunately [facility] is closed after 6pm. I informed the patient, he feels much better now after drinking some fluid. I will ask EP APP to followup on interrogation tomorrow AM to make sure he is not having recurrent VT.  Ramond Dial PA Pager: [phone]

## 2016-05-02 ENCOUNTER — Telehealth: Payer: Self-pay | Admitting: Physician Assistant

## 2016-05-02 NOTE — Telephone Encounter (Signed)
Was made aware of patient's c/o dizziness yesterday.  [facility] transmission without noted VT, rate histograms noted some HR towards 120 range.  I called the patient to follow up, left a message at his number that I was calling him back to f/u on yesterday's symptoms and to see how he was doing today and asked he give [place] a call back. I note that the PA on f/u yesterday noted he was feeling better after hydration with patient reported initial obesrvation of his  SBP of 100 and HR 75.  Francis Dowse, PA-C

## 2016-05-03 ENCOUNTER — Telehealth: Payer: Self-pay | Admitting: Physician Assistant

## 2016-05-03 NOTE — Telephone Encounter (Signed)
Spoke with the patient, he has continued to feel much improved and has been without symptoms since hydrating better on 05/01/16 as instructed.  He is encouraged to call if any concerns or recurrent symptoms.  Francis Dowse, PA-C

## 2016-05-13 ENCOUNTER — Other Ambulatory Visit: Payer: Self-pay | Admitting: Family Medicine

## 2016-05-13 NOTE — Telephone Encounter (Signed)
Received refill request electronically Last refill 02/12/16 #30/1 Last office visit 03/18/16

## 2016-05-14 NOTE — Telephone Encounter (Signed)
Rx called in to requested pharmacy 

## 2016-05-14 NOTE — Telephone Encounter (Signed)
Please call in.  Thanks.   

## 2016-05-16 ENCOUNTER — Encounter: Payer: Self-pay | Admitting: Family Medicine

## 2016-05-16 ENCOUNTER — Ambulatory Visit (INDEPENDENT_AMBULATORY_CARE_PROVIDER_SITE_OTHER): Payer: Medicare Other | Admitting: Family Medicine

## 2016-05-16 VITALS — BP 102/72 | HR 70 | Temp 97.5°F | Wt 149.5 lb

## 2016-05-16 DIAGNOSIS — M5489 Other dorsalgia: Secondary | ICD-10-CM | POA: Diagnosis not present

## 2016-05-16 DIAGNOSIS — G4700 Insomnia, unspecified: Secondary | ICD-10-CM

## 2016-05-16 DIAGNOSIS — J432 Centrilobular emphysema: Secondary | ICD-10-CM

## 2016-05-16 MED ORDER — ZOLPIDEM TARTRATE 10 MG PO TABS: 5.0000 mg | ORAL_TABLET | Freq: Every evening | ORAL | 1 refills | Status: DC | PRN

## 2016-05-16 MED ORDER — TRAMADOL HCL 50 MG PO TABS: 50.0000 mg | ORAL_TABLET | Freq: Three times a day (TID) | ORAL | 1 refills | Status: DC | PRN

## 2016-05-16 NOTE — Progress Notes (Signed)
Has noted occ singleton skipped beats.  No shocks in the meantime. Up and moving at home.  Able to go up and down stairs better now.  No CP.  Not SOB.  Still on O2.  No swelling.  Eating well.    He isn't tolerating hydrocodone.  It makes him drowsy, esp with skelaxin.  Still with neck pain.  Had been on tramadol and able to tolerate.  Able to tolerate skelaxin by itself.  D/w pt.    Xanax use- not tolerable for patient.  1 tab doesn't work, 2 tabs makes him sleepy but he'll have nightmares.  Still with insomnia, trouble getting to and staying asleep.  Not anxious in the day.  Remus Loffler worked better for patient.    R sided chest feels "raw" with deep breath via mouth, but not with deep nasal inhalation.  It happens with a deep breath or the mouth regardless if the deep breath have been slowly more quickly. Unclear if from inhaling relatively colder air via mouth vs nose.    PMH and SH reviewed  ROS: Per HPI unless specifically indicated in ROS section   Meds, vitals, and allergies reviewed.   GEN: nad, alert and pleasant, on nasal cannula O2 HEENT: mucous membranes moist NECK: supple w/o LA CV: rrr with occasional ectopy noted PULM: ctab, no inc wob ABD: soft, +bs EXT: no edema SKIN: no acute rash

## 2016-05-16 NOTE — Progress Notes (Signed)
Pre visit review using our clinic review tool, if applicable. No additional management support is needed unless otherwise documented below in the visit note. 

## 2016-05-16 NOTE — Patient Instructions (Signed)
Take care.  Glad to see you.  Update me as needed.   I'll await the cardiology notes.

## 2016-05-17 NOTE — Assessment & Plan Note (Signed)
Specifically neck pain. He did not tolerate hydrocodone. He did better with tramadol. Routine cautions discussed with patient. Change back to tramadol. Update me as needed. >25 minutes spent in face to face time with patient, >50% spent in counselling or coordination of care.

## 2016-05-17 NOTE — Assessment & Plan Note (Signed)
I don't know if he is a sensation in his chest that is related to inhaling relatively colder air via mouth vs nose, d/w pt.  In any event his lungs are clear and he has a benign pulmonary exam at this point. I would only observe for now. He agrees. He does not have typical chest pain.

## 2016-05-17 NOTE — Assessment & Plan Note (Signed)
He did not tolerate Xanax. Change back to Ambien. He tolerated that better previously. Update me as needed. Routine cautions given. He agrees. >25 minutes spent in face to face time with patient, >50% spent in counselling or coordination of care.

## 2016-05-20 ENCOUNTER — Encounter: Payer: Self-pay | Admitting: Pulmonary Disease

## 2016-05-20 ENCOUNTER — Other Ambulatory Visit: Payer: Medicare Other

## 2016-05-20 ENCOUNTER — Ambulatory Visit (INDEPENDENT_AMBULATORY_CARE_PROVIDER_SITE_OTHER): Payer: Medicare Other | Admitting: Pulmonary Disease

## 2016-05-20 VITALS — BP 120/72 | HR 68 | Ht 63.0 in | Wt 150.5 lb

## 2016-05-20 DIAGNOSIS — J8410 Pulmonary fibrosis, unspecified: Secondary | ICD-10-CM

## 2016-05-20 DIAGNOSIS — J984 Other disorders of lung: Secondary | ICD-10-CM | POA: Diagnosis not present

## 2016-05-20 DIAGNOSIS — T462X5A Adverse effect of other antidysrhythmic drugs, initial encounter: Secondary | ICD-10-CM | POA: Diagnosis not present

## 2016-05-20 MED ORDER — BUDESONIDE-FORMOTEROL FUMARATE 160-4.5 MCG/ACT IN AERO: 2.0000 | INHALATION_SPRAY | Freq: Two times a day (BID) | RESPIRATORY_TRACT | 6 refills | Status: DC

## 2016-05-20 MED ORDER — BUDESONIDE-FORMOTEROL FUMARATE 160-4.5 MCG/ACT IN AERO: 2.0000 | INHALATION_SPRAY | Freq: Two times a day (BID) | RESPIRATORY_TRACT | 0 refills | Status: DC

## 2016-05-20 NOTE — Progress Notes (Signed)
Patient ID: [name], male   DOB: [birth year], 71 y.o.   MRN: [id number]  Patient seen in the office today and instructed on use of symbicort aerosol.  Patient expressed understanding and demonstrated technique.

## 2016-05-20 NOTE — Patient Instructions (Addendum)
Recheck CCP and rheumatoid factor Schedule PFTs and high res CT of chest Start symbicort.160/4.5  Return in 3 months

## 2016-05-20 NOTE — Progress Notes (Signed)
[name]    [id number]    June 03, 1945  Primary Care Physician:Graham Damita Dunnings, MD  Referring Physician: Tonia Ghent, MD [address] [place], [place] [address]  Chief complaint:  Follow up for  Emphysema Amiodarone toxicity.  HPI: 71 y.o. male with extensive cardiac history and several recent admissions for HCAP which he failed several out patient antibiotic treatments. Admitted on 11/28/15 with SOB, hypoxia and worsening diffuse bilateral infiltrates. He had immunologic workup which showed borderline CCP and rheumatoid factor. He was diagnosed with amiodarone toxicity and started on a slow prednisone taper. He has been discharged on supplemental oxygen.  Interim History:  He has finished his prednisone but is still on supplemental oxygen. His respiratory status improved after discharge but now he states that breathing is worse since last office visit. He has increased shortness of breath, occasional productive cough with white mucus, dyspnea, chest tightness.He attributes the shortness of breath to the use of kionex which as give earlier this year for hyperkalemia. He is not longer taking this med but the dyspnea persists  Outpatient Encounter Prescriptions as of 05/20/2016  Medication Sig  . acetaminophen (TYLENOL) 325 MG tablet Take 2 tablets (650 mg total) by mouth every 6 (six) hours as needed for mild pain (or Fever >/= 101).  [name] apixaban (ELIQUIS) 2.5 MG TABS tablet Take 1 tablet (2.5 mg total) by mouth 2 (two) times daily.  . benzonatate (TESSALON) 200 MG capsule Take 1 capsule (200 mg total) by mouth every 4 (four) hours as needed for cough.  . Chlorpheniramine-DM (CORICIDIN HBP COUGH/COLD PO) Take 2 capsules by mouth daily as needed (cold).   . furosemide (LASIX) 20 MG tablet Take 1 tablet (20 mg total) by mouth every Monday, Wednesday, and Friday.  [name] guaiFENesin (MUCINEX) 600 MG 12 hr tablet Take 600 mg by mouth 2 (two) times daily.   [name] loperamide  (IMODIUM) 2 MG capsule Take 1 capsule (2 mg total) by mouth as needed for diarrhea or loose stools. (Patient taking differently: Take 4 mg by mouth See admin instructions. Take 2 tablets after each bowl movement.)  . metaxalone (SKELAXIN) 800 MG tablet Take 1 tablet (800 mg total) by mouth daily as needed for muscle spasms.  . metoprolol succinate (TOPROL XL) 25 MG 24 hr tablet Take 1 tablet (25 mg total) by mouth daily.  [name] mexiletine (MEXITIL) 150 MG capsule Take 2 capsules (300 mg total) by mouth every 8 (eight) hours.  . nitroGLYCERIN (NITROSTAT) 0.4 MG SL tablet Place 1 tablet (0.4 mg total) under the tongue every 5 (five) minutes as needed for chest pain.  . NON FORMULARY Oxygen 3 liters   24/7  . promethazine (PHENERGAN) 25 MG tablet Take 25 mg by mouth every 6 (six) hours as needed for nausea or vomiting.  [name] RANEXA 500 MG 12 hr tablet TAKE 1 TABLET (500 MG TOTAL) BY MOUTH 2 (TWO) TIMES DAILY.  . ranitidine (ZANTAC) 150 MG capsule Take 150 mg by mouth daily as needed for heartburn.   . Simethicone (GAS-X PO) Take by mouth. As needed.  . traMADol (ULTRAM) 50 MG tablet Take 1 tablet (50 mg total) by mouth every 8 (eight) hours as needed (for pain).  [name] zolpidem (AMBIEN) 10 MG tablet Take 0.5-1 tablets (5-10 mg total) by mouth at bedtime as needed for sleep.   Facility-Administered Encounter Medications as of 05/20/2016  Medication  . sodium chloride 0.9 % injection 3 mL  .  sodium chloride 0.9 % injection 3 mL    Allergies as of 05/20/2016 - Review Complete 05/20/2016  Allergen Reaction Noted  . Ace inhibitors Other (See Comments) 05/02/2010  . Codeine Other (See Comments)   . Doxycycline Diarrhea and Nausea And Vomiting 11/17/2015  . Kionex [sodium polystyrene sulfonate] Other (See Comments) 02/28/2016  . Penicillins Swelling   . Lisinopril Other (See Comments) 09/22/2014  . Statins Other (See Comments) 08/17/2013  . Hydrocodone Other (See Comments) 05/16/2016  . Lidocaine  03/10/2016    . Pacerone [amiodarone] Other (See Comments) 03/18/2016  . Xanax [alprazolam] Other (See Comments) 05/16/2016    Past Medical History:  Diagnosis Date  . Acute lower GI bleeding 12/11/2011   "first time" (12/11/2011)  . Acute myocardial infarction, unspecified site, episode of care unspecified 1995   Pt living in [place], no stent, ?PTCA  . AICD (automatic cardioverter/defibrillator) present   . Allergic rhinitis, cause unspecified   . Anxiety   . Arthritis    "all over" (11/07/2015)  . Atrial fibrillation ([place])    a. 05/2015 - converted to sinus in setting of ICD shocks; placed on eliquis 5 bid.  [name] CAD (coronary artery disease), autologous vein bypass graft   . Cervical herniated disc    told not to lift >10 lbs  . Chronic systolic CHF (congestive heart failure), NYHA class 2 (HCC)    Reports EF of 25%.   [name] COPD (chronic obstructive pulmonary disease) ([place]) 11/2012   by xray  . Diverticulosis    by CT scan  . HCAP (healthcare-associated pneumonia) 11/06/2015  . Hypertension   . Insomnia   . Paroxysmal ventricular tachycardia ([facility])   . Perennial allergic rhinitis    only to dust mites  . Pneumonia 2000s   "walking pneumonia"  . VT (ventricular tachycardia) ([place])    a. 05/2015 - VT storm with multiple ICD shocks-->Amio 400 BID.    Past Surgical History:  Procedure Laterality Date  . CARDIAC CATHETERIZATION  2004   LAD 30%, D1 30%, CFX-AV groove 70-80%, OM1 30%, EF 20-25%  . CARDIAC CATHETERIZATION N/A 09/28/2015   Procedure: Left Heart Cath and Coronary Angiography;  Surgeon: Peter M Martinique, MD;  Location: [facility];  Service: Cardiovascular;  Laterality: N/A;  . CARDIAC DEFIBRILLATOR PLACEMENT  2004  . CATARACT EXTRACTION W/ INTRAOCULAR LENS IMPLANT Right 01/2012  . COLONOSCOPY  01/08/2012   Procedure: COLONOSCOPY;  Surgeon: Gatha Mayer, MD;  Location: [facility];  Service: Endoscopy;  Laterality: N/A;  . CORONARY ANGIOPLASTY  1995   Pt thinks he got a  balloon, living in [place], [place] N/A 02/07/2012   Procedure: IMPLANTABLE CARDIOVERTER DEFIBRILLATOR GENERATOR CHANGE;  Surgeon: Evans Lance, MD; Medtronic Evera XT VR single-chamber serial number [id number], Laterality: Left  . INSERT / REPLACE / REMOVE PACEMAKER  2004   Medtronic ICD  . KNEE ARTHROSCOPY Left 05/2003   Archie Endo 06/19/2010  . LAPAROSCOPIC CHOLECYSTECTOMY  1/ 2012  . SHOULDER ARTHROSCOPY W/ ROTATOR CUFF REPAIR Right twice  . TONSILLECTOMY AND ADENOIDECTOMY  ~ 1951  . V TACH ABLATION N/A 04/29/2016   Procedure: V Tach Ablation;  Surgeon: Evans Lance, MD;  Location: [facility];  Service: Cardiovascular;  Laterality: N/A;    Family History  Problem Relation Age of Onset  . Diabetes Father   . Tracheal cancer Father 68    smoker  . Stroke Mother   . Cancer Sister  left eye  . CAD Neg Hx   . Colon cancer Neg Hx   . Prostate cancer Neg Hx     Social History   Social History  . Marital status: Married    Spouse name: N/A  . Number of children: 0  . Years of education: N/A   Occupational History  . [employer] truck driver (retired)    Social History Main Topics  . Smoking status: Former Smoker    Packs/day: 0.50    Years: 50.00    Types: Cigarettes, Cigars    Quit date: 09/27/2015  . Smokeless tobacco: Never Used  . Alcohol use No  . Drug use: No  . Sexual activity: Not Currently   Other Topics Concern  . Not on file   Social History Narrative   Lives with wife, married 1998   Grown children, 2 great grandchildren   Occupation: retired, was Probation officer   Activity: walking, fishing   Diet: good water daily, fruits/vegetables rare      Wife is Economist.    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 No known agent orange exposure.     Review of systems: Review of Systems  Constitutional: Negative for fever and chills.  HENT: Negative.   Eyes: Negative for blurred vision.  Respiratory: as per HPI    Cardiovascular: Negative for chest pain and palpitations.  Gastrointestinal: Negative for vomiting, diarrhea, blood per rectum. Genitourinary: Negative for dysuria, urgency, frequency and hematuria.  Musculoskeletal: Negative for myalgias, back pain and joint pain.  Skin: Negative for itching and rash.  Neurological: Negative for dizziness, tremors, focal weakness, seizures and loss of consciousness.  Endo/Heme/Allergies: Negative for environmental allergies.  Psychiatric/Behavioral: Negative for depression, suicidal ideas and hallucinations.  All other systems reviewed and are negative.  Physical Exam: Blood pressure 120/72, pulse 68, height '5\' 3"'  (1.6 m), weight 150 lb 8 oz (68.3 kg), SpO2 90 %. Gen:      No acute distress HEENT:  EOMI, sclera anicteric Neck:     No masses; no thyromegaly Lungs:    Clear to auscultation bilaterally; normal respiratory effort CV:         Regular rate and rhythm; no murmurs Abd:      + bowel sounds; soft, non-tender; no palpable masses, no distension Ext:    No edema; adequate peripheral perfusion Skin:      Warm and dry; no rash Neuro: alert and oriented x 3 Psych: normal mood and affect  Data Reviewed: Imaging [facility] 09/28/15 >> Prox RCA lesion 100%, Mid cx 70%. EF 25% estimated. Continue medical therapy, no PCI. CT Chest 11/17/15 >> Multi focal infiltrate involving the lower lobes, left greater than right, and the left upper lobe is most likely an infectious or inflammatory process. Recommend short-term follow-up to ensure resolution. 4 mm nodule in the right upper lobe. Recommend attention on short-term follow-up. TTE 11/30/15 >> EF 25-30% w/ akinesis of inferolateral & inferior myocardium. Grade 2 diastolic dysfunction. RV normal in size and function. CXR 12/19/15 >> small improvement in bilateral opacities. All images personally reviewed.  CP Serologies: CRP: 10.9 ESR: 40 PR-3: <3.5 DS DNA Ab: <1 MPO: <9 RF: 12.1 C3: 78 C4: 9 ANA:  Negative ANCA:  Negative Anti-CCP:  19  Assessment:  Emphysema  He is a heavy smoker with emphysematous changes noted on CT scan. We will get PFTs for further evaluation. Start him on Symbicort 160/4.5 for dyspnea with wheezing.  Amiodarone toxicity, ? Unspecified ILD He has finished the prednisone taper for  the amiodarone toxicity. Last chest x-ray shows some residual fibrosis which may be postinflammatory from amiodarone pneumonitis  He has an autoimmune workup with borderline CCP and rheumatoid factor. We will repeat them when he is off the prednisone completely. He will need a rheumatology consult if they turn positive or if he develops arthritis symptoms. We will get a high-resolution CT for further evaluation of this lung fibrosis.  Plan/Recommendations: - Continue supplemental oxygen - Pulmonary function tests, high-resolution CT - Start Symbicort                                         Marshell Garfinkel MD [facility] Pager [phone] 05/20/2016, 3:49 PM  CC: Tonia Ghent, MD

## 2016-05-21 LAB — RHEUMATOID FACTOR: RHEUMATOID FACTOR: 46 [IU]/mL — AB (ref <14)

## 2016-05-21 LAB — CYCLIC CITRUL PEPTIDE ANTIBODY, IGG: Cyclic Citrullin Peptide Ab: <16 Units

## 2016-05-28 ENCOUNTER — Encounter: Payer: Self-pay | Admitting: Internal Medicine

## 2016-05-28 ENCOUNTER — Ambulatory Visit (INDEPENDENT_AMBULATORY_CARE_PROVIDER_SITE_OTHER): Payer: Medicare Other | Admitting: Internal Medicine

## 2016-05-28 VITALS — BP 124/82 | HR 114 | Ht 63.0 in | Wt 151.0 lb

## 2016-05-28 DIAGNOSIS — I472 Ventricular tachycardia, unspecified: Secondary | ICD-10-CM

## 2016-05-28 DIAGNOSIS — Z95810 Presence of automatic (implantable) cardiac defibrillator: Secondary | ICD-10-CM | POA: Diagnosis not present

## 2016-05-28 DIAGNOSIS — I5022 Chronic systolic (congestive) heart failure: Secondary | ICD-10-CM

## 2016-05-28 DIAGNOSIS — I255 Ischemic cardiomyopathy: Secondary | ICD-10-CM

## 2016-05-28 DIAGNOSIS — I482 Chronic atrial fibrillation, unspecified: Secondary | ICD-10-CM

## 2016-05-28 DIAGNOSIS — I2589 Other forms of chronic ischemic heart disease: Secondary | ICD-10-CM

## 2016-05-28 NOTE — Progress Notes (Signed)
HPI Mr. Collington returns today for followup. He is a 71 year old man with an ischemic cardiomyopathy, chronic systolic heart failure, class III, chronic atrial fibrillation, previously unwilling to take any anticoagulation, ventricular tachycardia, status post ICD implantation, s/p recent ablation.  He was in the hospital several months ago with VT storm but ultimately developed amio lung toxicity and had to be switched to mexitil. He is also on ranexa. He has been bothered by hypotension and had to have his losartan stopped. He denies chest pain. He does not have palpitations.  Allergies  Allergen Reactions  . Ace Inhibitors Other (See Comments)    muscle pain. Tolerates ARBs.   . Codeine Other (See Comments)    "head wants to explode."  . Doxycycline Diarrhea and Nausea And Vomiting  . Kionex [Sodium Polystyrene Sulfonate] Other (See Comments)    SOB, pressure in chest, weakness fatigue.   [name] Penicillins Swelling    "started at point of injection; w/in 3 min my upper arm was swollen 3 times normal" Has patient had a PCN reaction causing immediate rash, facial/tongue/throat swelling, SOB or lightheadedness with hypotension: Yes Has patient had a PCN reaction causing severe rash involving mucus membranes or skin necrosis: No Has patient had a PCN reaction that required hospitalization No Has patient had a PCN reaction occurring within the last 10 years: No If all of the above answers are "NO", then may proceed wi  . Lisinopril Other (See Comments)    Muscle Pain  . Statins Other (See Comments)    Myalgias per patient  . Hydrocodone Other (See Comments)    Intolerant, esp when combined with skelaxin.    . Lidocaine     Hallucinations, jerking  . Pacerone [Amiodarone] Other (See Comments)    Lung and heart problem  . Xanax [Alprazolam] Other (See Comments)    Nightmares.       Current Outpatient Prescriptions  Medication Sig Dispense Refill  . acetaminophen (TYLENOL) 325 MG tablet Take  2 tablets (650 mg total) by mouth every 6 (six) hours as needed for mild pain (or Fever >/= 101).    [name] apixaban (ELIQUIS) 2.5 MG TABS tablet Take 1 tablet (2.5 mg total) by mouth 2 (two) times daily. 180 tablet 3  . benzonatate (TESSALON) 200 MG capsule Take 1 capsule (200 mg total) by mouth every 4 (four) hours as needed for cough. 20 capsule 0  . budesonide-formoterol (SYMBICORT) 160-4.5 MCG/ACT inhaler Inhale 2 puffs into the lungs 2 (two) times daily. 1 Inhaler 0  . budesonide-formoterol (SYMBICORT) 160-4.5 MCG/ACT inhaler Inhale 2 puffs into the lungs 2 (two) times daily. 1 Inhaler 6  . Chlorpheniramine-DM (CORICIDIN HBP COUGH/COLD PO) Take 2 capsules by mouth daily as needed (cold).     . furosemide (LASIX) 20 MG tablet Take 1 tablet (20 mg total) by mouth every Monday, Wednesday, and Friday. 45 tablet 3  . guaiFENesin (MUCINEX) 600 MG 12 hr tablet Take 600 mg by mouth 2 (two) times daily.     [name] loperamide (IMODIUM) 2 MG capsule Take 1 capsule (2 mg total) by mouth as needed for diarrhea or loose stools. (Patient taking differently: Take 4 mg by mouth See admin instructions. Take 2 tablets after each bowl movement.) 30 capsule 0  . metaxalone (SKELAXIN) 800 MG tablet Take 1 tablet (800 mg total) by mouth daily as needed for muscle spasms. 30 tablet 1  . metoprolol succinate (TOPROL XL) 25 MG 24 hr tablet Take 1 tablet (25 mg total) by mouth  daily. 90 tablet 3  . mexiletine (MEXITIL) 150 MG capsule Take 2 capsules (300 mg total) by mouth every 8 (eight) hours. 180 capsule 3  . nitroGLYCERIN (NITROSTAT) 0.4 MG SL tablet Place 1 tablet (0.4 mg total) under the tongue every 5 (five) minutes as needed for chest pain. 25 tablet 1  . NON FORMULARY Oxygen 3 liters   24/7    . promethazine (PHENERGAN) 25 MG tablet Take 25 mg by mouth every 6 (six) hours as needed for nausea or vomiting.    [name] RANEXA 500 MG 12 hr tablet TAKE 1 TABLET (500 MG TOTAL) BY MOUTH 2 (TWO) TIMES DAILY. 180 tablet 3  . ranitidine  (ZANTAC) 150 MG capsule Take 150 mg by mouth daily as needed for heartburn.     . Simethicone (GAS-X PO) Take by mouth. As needed.    . traMADol (ULTRAM) 50 MG tablet Take 1 tablet (50 mg total) by mouth every 8 (eight) hours as needed (for pain). 100 tablet 1  . zolpidem (AMBIEN) 10 MG tablet Take 0.5-1 tablets (5-10 mg total) by mouth at bedtime as needed for sleep. 30 tablet 1   No current facility-administered medications for this visit.    Facility-Administered Medications Ordered in Other Visits  Medication Dose Route Frequency Provider Last Rate Last Dose  . sodium chloride 0.9 % injection 3 mL  3 mL Intravenous Q12H Marinus Maw, MD      . sodium chloride 0.9 % injection 3 mL  3 mL Intravenous PRN Marinus Maw, MD         Past Medical History:  Diagnosis Date  . Acute lower GI bleeding 12/11/2011   "first time" (12/11/2011)  . Acute myocardial infarction, unspecified site, episode of care unspecified 1995   Pt living in [place], no stent, ?PTCA  . AICD (automatic cardioverter/defibrillator) present   . Allergic rhinitis, cause unspecified   . Anxiety   . Arthritis    "all over" (11/07/2015)  . Atrial fibrillation ([facility])    a. 05/2015 - converted to sinus in setting of ICD shocks; placed on eliquis 5 bid.  [name] CAD (coronary artery disease), autologous vein bypass graft   . Cervical herniated disc    told not to lift >10 lbs  . Chronic systolic CHF (congestive heart failure), NYHA class 2 (HCC)    Reports EF of 25%.   [name] COPD (chronic obstructive pulmonary disease) ([facility]) 11/2012   by xray  . Diverticulosis    by CT scan  . HCAP (healthcare-associated pneumonia) 11/06/2015  . Hypertension   . Insomnia   . Paroxysmal ventricular tachycardia ([facility])   . Perennial allergic rhinitis    only to dust mites  . Pneumonia 2000s   "walking pneumonia"  . VT (ventricular tachycardia) ([facility])    a. 05/2015 - VT storm with multiple ICD shocks-->Amio 400 BID.    ROS:   All systems  reviewed and negative except as noted in the HPI.   Past Surgical History:  Procedure Laterality Date  . CARDIAC CATHETERIZATION  2004   LAD 30%, D1 30%, CFX-AV groove 70-80%, OM1 30%, EF 20-25%  . CARDIAC CATHETERIZATION N/A 09/28/2015   Procedure: Left Heart Cath and Coronary Angiography;  Surgeon: Peter M Swaziland, MD;  Location: [facility];  Service: Cardiovascular;  Laterality: N/A;  . CARDIAC DEFIBRILLATOR PLACEMENT  2004  . CATARACT EXTRACTION W/ INTRAOCULAR LENS IMPLANT Right 01/2012  . COLONOSCOPY  01/08/2012   Procedure: COLONOSCOPY;  Surgeon: Maryjean Morn  Leone Payor, MD;  Location: [facility];  Service: Endoscopy;  Laterality: N/A;  . CORONARY ANGIOPLASTY  1995   Pt thinks he got a balloon, living in [place], [place]  . IMPLANTABLE CARDIOVERTER DEFIBRILLATOR GENERATOR CHANGE N/A 02/07/2012   Procedure: IMPLANTABLE CARDIOVERTER DEFIBRILLATOR GENERATOR CHANGE;  Surgeon: Marinus Maw, MD; Medtronic Evera XT VR single-chamber serial number [id number], Laterality: Left  . INSERT / REPLACE / REMOVE PACEMAKER  2004   Medtronic ICD  . KNEE ARTHROSCOPY Left 05/2003   Hattie Perch 06/19/2010  . LAPAROSCOPIC CHOLECYSTECTOMY  1/ 2012  . SHOULDER ARTHROSCOPY W/ ROTATOR CUFF REPAIR Right twice  . TONSILLECTOMY AND ADENOIDECTOMY  ~ 1951  . V TACH ABLATION N/A 04/29/2016   Procedure: V Tach Ablation;  Surgeon: Marinus Maw, MD;  Location: [facility];  Service: Cardiovascular;  Laterality: N/A;     Family History  Problem Relation Age of Onset  . Diabetes Father   . Tracheal cancer Father 30    smoker  . Stroke Mother   . Cancer Sister     left eye  . CAD Neg Hx   . Colon cancer Neg Hx   . Prostate cancer Neg Hx      Social History   Social History  . Marital status: Married    Spouse name: N/A  . Number of children: 0  . Years of education: N/A   Occupational History  . [employer] truck driver (retired)    Social History Main Topics  . Smoking status: Former Smoker     Packs/day: 0.50    Years: 50.00    Types: Cigarettes, Cigars    Quit date: 09/27/2015  . Smokeless tobacco: Never Used  . Alcohol use No  . Drug use: No  . Sexual activity: Not Currently   Other Topics Concern  . Not on file   Social History Narrative   Lives with wife, married 1998   Grown children, 2 great grandchildren   Occupation: retired, was Presenter, broadcasting   Activity: walking, fishing   Diet: good water daily, fruits/vegetables rare      Wife is Product manager.    [id number], Human resources officer. No known agent orange exposure.       BP 124/82   Pulse (!) 114   Ht 5\' 3"  (1.6 m)   Wt 151 lb (68.5 kg)   BMI 26.75 kg/m   Physical Exam:  Chronically ill appearing 71 yo man, NAD HEENT: Unremarkable Neck:  6 cm JVD, no thyromegally Lungs:  Clear except for rales in the bases. No wheezes or rhonchi. Well-healed ICD incision. HEART:  Regular brady rhythm, no murmurs, no rubs, no clicks Abd:  soft, positive bowel sounds, no organomegally, no rebound, no guarding Ext:  2 plus pulses, no edema, no cyanosis, no clubbing Skin:  No rashes no nodules Neuro:  CN II through XII intact, motor grossly intact  ECG - VT at 114/min, left bundle , left axis.  DEVICE  Normal device function.  See PaceArt for details. Optivol is stable.  Assess/Plan:  1. VT - he has stopped amio and is on mexitil 300 tid. He improved with regard to his histograms.  2. Chronic systolic heart failure - his symptoms are controlled. No change in meds. He is improved. 3. ICD - his Medtronic device is working normally. Will recheck in several months.  4. Atrial fib - he is at risk for stroke. He is tolerating his Eliquis for thromboembolic prevention but is having lots of skin  bleeding and insists on his dose being reduced to 2.5 mg twice daily.  Leonia Reeves.D.

## 2016-05-28 NOTE — Patient Instructions (Addendum)
Medication Instructions:  Your physician recommends that you continue on your current medications as directed. Please refer to the Current Medication list given to you today.  Labwork: None ordered  Testing/Procedures: None ordered  Follow-Up: Your physician recommends that you schedule a follow-up appointment in: 3 months with Dr. Taylor.  If you need a refill on your cardiac medications before your next appointment, please call your pharmacy.  Thank you for choosing [facility]!!        

## 2016-05-30 ENCOUNTER — Ambulatory Visit: Payer: Medicare Other | Admitting: Pulmonary Disease

## 2016-05-30 LAB — CUP PACEART INCLINIC DEVICE CHECK
Battery Remaining Longevity: 54 mo
Battery Voltage: 2.99 V
Date Time Interrogation Session: 20180424171146
HIGH POWER IMPEDANCE MEASURED VALUE: 48 Ohm
HighPow Impedance: 42 Ohm
Implantable Lead Implant Date: 20040722
Implantable Lead Location: 753860
Implantable Lead Model: 6947
Implantable Pulse Generator Implant Date: 20140103
Lead Channel Impedance Value: 380 Ohm
Lead Channel Pacing Threshold Pulse Width: 0.4 ms
Lead Channel Sensing Intrinsic Amplitude: 5.25 mV
Lead Channel Setting Pacing Amplitude: 2.5 V
Lead Channel Setting Pacing Pulse Width: 0.4 ms
MDC IDC MSMT LEADCHNL RV IMPEDANCE VALUE: 323 Ohm
MDC IDC MSMT LEADCHNL RV PACING THRESHOLD AMPLITUDE: 0.625 V
MDC IDC MSMT LEADCHNL RV SENSING INTR AMPL: 3.25 mV
MDC IDC SET LEADCHNL RV SENSING SENSITIVITY: 0.3 mV
MDC IDC STAT BRADY RV PERCENT PACED: 0.02 %

## 2016-06-17 ENCOUNTER — Telehealth: Payer: Self-pay

## 2016-06-17 NOTE — Telephone Encounter (Signed)
LVM on pt's cell phone and pt's wife cell phone to call back regarding VT episode

## 2016-06-17 NOTE — Telephone Encounter (Signed)
Spoke with pt regarding, shock from 5/13 at 6:45am, pt stated that he thought he just dreamed it. Pt stated that he felt fine all day Saturday and had taken his medications as prescribed. Pt stated that he was feeling fine today. Informed pt that I would reviewed with Dr. [name] and would call back with any recommendation. Pt voiced understanding and aware of driving restrictions.

## 2016-06-17 NOTE — Telephone Encounter (Signed)
Voicemail left on pt and pts wife cell phone regarding VT episode.

## 2016-06-18 ENCOUNTER — Telehealth: Payer: Self-pay | Admitting: Cardiology

## 2016-06-18 ENCOUNTER — Telehealth: Payer: Self-pay

## 2016-06-18 NOTE — Telephone Encounter (Signed)
LVM for pt to call back and schedule an appointment with [facility] for device programming changes recommended by Dr. [name].

## 2016-06-18 NOTE — Telephone Encounter (Signed)
Spoke with pt, pt stated that he got this morning around a 8 he felt a "heaviness in his chest but didn't know if it was related to his heart or breathing" took his medications around 9 sat down in the recliner and felt the device heat up like it was going to fire. Pt stated that he feels ok now. Informed pt that I would review with Dr. [name] and would get back to him as soon as possible.

## 2016-06-18 NOTE — Telephone Encounter (Signed)
Received called from the answering service and they stated that [name] called and stated that his devices shocked him again. Informed answering service that I would call pt. Called pt and LMOVM for pt to return call provide my direct number on his voicemail.

## 2016-06-19 ENCOUNTER — Encounter (HOSPITAL_COMMUNITY): Payer: Self-pay | Admitting: Emergency Medicine

## 2016-06-19 ENCOUNTER — Inpatient Hospital Stay (HOSPITAL_COMMUNITY)
Admission: EM | Admit: 2016-06-19 | Discharge: 2016-06-22 | DRG: 309 | Disposition: A | Discharge status: Home / Self Care | Payer: Medicare Other | Attending: Cardiovascular Disease | Admitting: Cardiovascular Disease

## 2016-06-19 ENCOUNTER — Emergency Department (HOSPITAL_COMMUNITY): Payer: Medicare Other

## 2016-06-19 DIAGNOSIS — I252 Old myocardial infarction: Secondary | ICD-10-CM | POA: Diagnosis not present

## 2016-06-19 DIAGNOSIS — F419 Anxiety disorder, unspecified: Secondary | ICD-10-CM | POA: Diagnosis present

## 2016-06-19 DIAGNOSIS — J3089 Other allergic rhinitis: Secondary | ICD-10-CM | POA: Diagnosis not present

## 2016-06-19 DIAGNOSIS — T82190A Other mechanical complication of cardiac electrode, initial encounter: Secondary | ICD-10-CM | POA: Diagnosis not present

## 2016-06-19 DIAGNOSIS — I480 Paroxysmal atrial fibrillation: Secondary | ICD-10-CM | POA: Diagnosis present

## 2016-06-19 DIAGNOSIS — I255 Ischemic cardiomyopathy: Secondary | ICD-10-CM | POA: Diagnosis present

## 2016-06-19 DIAGNOSIS — Z880 Allergy status to penicillin: Secondary | ICD-10-CM

## 2016-06-19 DIAGNOSIS — Z4502 Encounter for adjustment and management of automatic implantable cardiac defibrillator: Secondary | ICD-10-CM

## 2016-06-19 DIAGNOSIS — I471 Supraventricular tachycardia: Secondary | ICD-10-CM | POA: Diagnosis present

## 2016-06-19 DIAGNOSIS — I2510 Atherosclerotic heart disease of native coronary artery without angina pectoris: Secondary | ICD-10-CM | POA: Diagnosis present

## 2016-06-19 DIAGNOSIS — I472 Ventricular tachycardia, unspecified: Secondary | ICD-10-CM

## 2016-06-19 DIAGNOSIS — I517 Cardiomegaly: Secondary | ICD-10-CM | POA: Diagnosis not present

## 2016-06-19 DIAGNOSIS — Z9049 Acquired absence of other specified parts of digestive tract: Secondary | ICD-10-CM

## 2016-06-19 DIAGNOSIS — N182 Chronic kidney disease, stage 2 (mild): Secondary | ICD-10-CM | POA: Diagnosis present

## 2016-06-19 DIAGNOSIS — I5022 Chronic systolic (congestive) heart failure: Secondary | ICD-10-CM | POA: Diagnosis not present

## 2016-06-19 DIAGNOSIS — I25118 Atherosclerotic heart disease of native coronary artery with other forms of angina pectoris: Secondary | ICD-10-CM | POA: Diagnosis present

## 2016-06-19 DIAGNOSIS — I4719 Other supraventricular tachycardia: Secondary | ICD-10-CM

## 2016-06-19 DIAGNOSIS — T82599A Other mechanical complication of unspecified cardiac and vascular devices and implants, initial encounter: Secondary | ICD-10-CM | POA: Diagnosis not present

## 2016-06-19 DIAGNOSIS — J449 Chronic obstructive pulmonary disease, unspecified: Secondary | ICD-10-CM | POA: Diagnosis present

## 2016-06-19 DIAGNOSIS — I130 Hypertensive heart and chronic kidney disease with heart failure and stage 1 through stage 4 chronic kidney disease, or unspecified chronic kidney disease: Secondary | ICD-10-CM | POA: Diagnosis not present

## 2016-06-19 DIAGNOSIS — Z7951 Long term (current) use of inhaled steroids: Secondary | ICD-10-CM

## 2016-06-19 DIAGNOSIS — Z885 Allergy status to narcotic agent status: Secondary | ICD-10-CM

## 2016-06-19 DIAGNOSIS — Z7901 Long term (current) use of anticoagulants: Secondary | ICD-10-CM

## 2016-06-19 DIAGNOSIS — Z79899 Other long term (current) drug therapy: Secondary | ICD-10-CM

## 2016-06-19 DIAGNOSIS — D638 Anemia in other chronic diseases classified elsewhere: Secondary | ICD-10-CM | POA: Diagnosis present

## 2016-06-19 DIAGNOSIS — R079 Chest pain, unspecified: Secondary | ICD-10-CM | POA: Diagnosis not present

## 2016-06-19 DIAGNOSIS — E785 Hyperlipidemia, unspecified: Secondary | ICD-10-CM | POA: Diagnosis present

## 2016-06-19 DIAGNOSIS — Z9981 Dependence on supplemental oxygen: Secondary | ICD-10-CM

## 2016-06-19 DIAGNOSIS — Z888 Allergy status to other drugs, medicaments and biological substances status: Secondary | ICD-10-CM

## 2016-06-19 DIAGNOSIS — Z881 Allergy status to other antibiotic agents status: Secondary | ICD-10-CM

## 2016-06-19 DIAGNOSIS — Z951 Presence of aortocoronary bypass graft: Secondary | ICD-10-CM

## 2016-06-19 DIAGNOSIS — Z87891 Personal history of nicotine dependence: Secondary | ICD-10-CM

## 2016-06-19 DIAGNOSIS — Z95810 Presence of automatic (implantable) cardiac defibrillator: Secondary | ICD-10-CM

## 2016-06-19 DIAGNOSIS — M1990 Unspecified osteoarthritis, unspecified site: Secondary | ICD-10-CM | POA: Diagnosis present

## 2016-06-19 LAB — CBC WITH DIFFERENTIAL/PLATELET
Basophils Absolute: 0.1 10*3/uL (ref 0.0–0.1)
Basophils Relative: 1 %
Eosinophils Absolute: 0.3 10*3/uL (ref 0.0–0.7)
Eosinophils Relative: 4 %
HEMATOCRIT: 32.7 % — AB (ref 39.0–52.0)
HEMOGLOBIN: 9.6 g/dL — AB (ref 13.0–17.0)
LYMPHS ABS: 1.9 10*3/uL (ref 0.7–4.0)
Lymphocytes Relative: 22 %
MCH: 22.9 pg — AB (ref 26.0–34.0)
MCHC: 29.4 g/dL — AB (ref 30.0–36.0)
MCV: 77.9 fL — ABNORMAL LOW (ref 78.0–100.0)
MONOS PCT: 9 %
Monocytes Absolute: 0.8 10*3/uL (ref 0.1–1.0)
NEUTROS ABS: 5.5 10*3/uL (ref 1.7–7.7)
NEUTROS PCT: 64 %
Platelets: 340 10*3/uL (ref 150–400)
RBC: 4.2 MIL/uL — ABNORMAL LOW (ref 4.22–5.81)
RDW: 16.9 % — ABNORMAL HIGH (ref 11.5–15.5)
WBC: 8.5 10*3/uL (ref 4.0–10.5)

## 2016-06-19 LAB — BASIC METABOLIC PANEL
Anion gap: 8 (ref 5–15)
BUN: 22 mg/dL — AB (ref 6–20)
CHLORIDE: 108 mmol/L (ref 101–111)
CO2: 23 mmol/L (ref 22–32)
CREATININE: 1.18 mg/dL (ref 0.61–1.24)
Calcium: 8.5 mg/dL — ABNORMAL LOW (ref 8.9–10.3)
GFR calc Af Amer: >60 mL/min (ref >60)
GFR calc non Af Amer: 60 mL/min — ABNORMAL LOW (ref >60)
GLUCOSE: 93 mg/dL (ref 65–99)
Potassium: 4 mmol/L (ref 3.5–5.1)
Sodium: 139 mmol/L (ref 135–145)

## 2016-06-19 LAB — BRAIN NATRIURETIC PEPTIDE: B Natriuretic Peptide: 322.2 pg/mL — ABNORMAL HIGH (ref 0.0–100.0)

## 2016-06-19 LAB — MAGNESIUM: Magnesium: 2 mg/dL (ref 1.7–2.4)

## 2016-06-19 MED ORDER — GUAIFENESIN ER 600 MG PO TB12
600.0000 mg | ORAL_TABLET | Freq: Two times a day (BID) | ORAL | Status: DC
  Administered 2016-06-19 – 2016-06-22 (×6): 600 mg via ORAL
  Filled 2016-06-19 (×6): qty 1

## 2016-06-19 MED ORDER — RANOLAZINE ER 500 MG PO TB12
500.0000 mg | ORAL_TABLET | Freq: Two times a day (BID) | ORAL | Status: DC
  Administered 2016-06-19 – 2016-06-22 (×6): 500 mg via ORAL
  Filled 2016-06-19 (×7): qty 1

## 2016-06-19 MED ORDER — APIXABAN 2.5 MG PO TABS
2.5000 mg | ORAL_TABLET | Freq: Two times a day (BID) | ORAL | Status: DC
  Administered 2016-06-19 – 2016-06-22 (×6): 2.5 mg via ORAL
  Filled 2016-06-19 (×7): qty 1

## 2016-06-19 NOTE — ED Triage Notes (Signed)
Per EMS: pt defib went off three times today.  At 1700, 1710, 1715.  Pt c/o of no pain or distress. Pt A&Ox4 and VSS.  Pt took 2 nitro after he was shocked. Pt called PCP and they told him to come see them tomorrow.

## 2016-06-19 NOTE — ED Notes (Signed)
Pt ICD interrogated per MD verbal order.

## 2016-06-20 ENCOUNTER — Telehealth: Payer: Self-pay | Admitting: Internal Medicine

## 2016-06-20 ENCOUNTER — Encounter (HOSPITAL_COMMUNITY): Payer: Self-pay | Admitting: Internal Medicine

## 2016-06-20 DIAGNOSIS — Z7951 Long term (current) use of inhaled steroids: Secondary | ICD-10-CM | POA: Diagnosis not present

## 2016-06-20 DIAGNOSIS — I255 Ischemic cardiomyopathy: Secondary | ICD-10-CM | POA: Diagnosis not present

## 2016-06-20 DIAGNOSIS — Z79899 Other long term (current) drug therapy: Secondary | ICD-10-CM | POA: Diagnosis not present

## 2016-06-20 DIAGNOSIS — Z9981 Dependence on supplemental oxygen: Secondary | ICD-10-CM | POA: Diagnosis not present

## 2016-06-20 DIAGNOSIS — I5022 Chronic systolic (congestive) heart failure: Secondary | ICD-10-CM | POA: Diagnosis not present

## 2016-06-20 DIAGNOSIS — I472 Ventricular tachycardia, unspecified: Secondary | ICD-10-CM

## 2016-06-20 DIAGNOSIS — Z87891 Personal history of nicotine dependence: Secondary | ICD-10-CM | POA: Diagnosis not present

## 2016-06-20 DIAGNOSIS — I480 Paroxysmal atrial fibrillation: Secondary | ICD-10-CM

## 2016-06-20 DIAGNOSIS — Z7901 Long term (current) use of anticoagulants: Secondary | ICD-10-CM | POA: Diagnosis not present

## 2016-06-20 DIAGNOSIS — I2510 Atherosclerotic heart disease of native coronary artery without angina pectoris: Secondary | ICD-10-CM

## 2016-06-20 DIAGNOSIS — N182 Chronic kidney disease, stage 2 (mild): Secondary | ICD-10-CM | POA: Diagnosis not present

## 2016-06-20 DIAGNOSIS — I471 Supraventricular tachycardia: Secondary | ICD-10-CM

## 2016-06-20 DIAGNOSIS — M1990 Unspecified osteoarthritis, unspecified site: Secondary | ICD-10-CM | POA: Diagnosis present

## 2016-06-20 DIAGNOSIS — J449 Chronic obstructive pulmonary disease, unspecified: Secondary | ICD-10-CM | POA: Diagnosis not present

## 2016-06-20 DIAGNOSIS — E785 Hyperlipidemia, unspecified: Secondary | ICD-10-CM | POA: Diagnosis not present

## 2016-06-20 DIAGNOSIS — D638 Anemia in other chronic diseases classified elsewhere: Secondary | ICD-10-CM | POA: Diagnosis not present

## 2016-06-20 DIAGNOSIS — F419 Anxiety disorder, unspecified: Secondary | ICD-10-CM | POA: Diagnosis not present

## 2016-06-20 DIAGNOSIS — Z9049 Acquired absence of other specified parts of digestive tract: Secondary | ICD-10-CM | POA: Diagnosis not present

## 2016-06-20 DIAGNOSIS — J3089 Other allergic rhinitis: Secondary | ICD-10-CM | POA: Diagnosis not present

## 2016-06-20 DIAGNOSIS — I252 Old myocardial infarction: Secondary | ICD-10-CM | POA: Diagnosis not present

## 2016-06-20 DIAGNOSIS — Z885 Allergy status to narcotic agent status: Secondary | ICD-10-CM | POA: Diagnosis not present

## 2016-06-20 DIAGNOSIS — I25118 Atherosclerotic heart disease of native coronary artery with other forms of angina pectoris: Secondary | ICD-10-CM | POA: Diagnosis present

## 2016-06-20 DIAGNOSIS — I130 Hypertensive heart and chronic kidney disease with heart failure and stage 1 through stage 4 chronic kidney disease, or unspecified chronic kidney disease: Secondary | ICD-10-CM | POA: Diagnosis not present

## 2016-06-20 DIAGNOSIS — Z881 Allergy status to other antibiotic agents status: Secondary | ICD-10-CM | POA: Diagnosis not present

## 2016-06-20 DIAGNOSIS — Z4502 Encounter for adjustment and management of automatic implantable cardiac defibrillator: Secondary | ICD-10-CM

## 2016-06-20 DIAGNOSIS — Z951 Presence of aortocoronary bypass graft: Secondary | ICD-10-CM | POA: Diagnosis not present

## 2016-06-20 LAB — CBC
HCT: 34.7 % — ABNORMAL LOW (ref 39.0–52.0)
Hemoglobin: 10.2 g/dL — ABNORMAL LOW (ref 13.0–17.0)
MCH: 22.8 pg — AB (ref 26.0–34.0)
MCHC: 29.4 g/dL — AB (ref 30.0–36.0)
MCV: 77.6 fL — ABNORMAL LOW (ref 78.0–100.0)
Platelets: 354 10*3/uL (ref 150–400)
RBC: 4.47 MIL/uL (ref 4.22–5.81)
RDW: 17.1 % — AB (ref 11.5–15.5)
WBC: 10.2 10*3/uL (ref 4.0–10.5)

## 2016-06-20 LAB — MRSA PCR SCREENING: MRSA BY PCR: NEGATIVE

## 2016-06-20 LAB — BASIC METABOLIC PANEL
Anion gap: 10 (ref 5–15)
BUN: 21 mg/dL — ABNORMAL HIGH (ref 6–20)
CALCIUM: 8.6 mg/dL — AB (ref 8.9–10.3)
CO2: 22 mmol/L (ref 22–32)
CREATININE: 1.16 mg/dL (ref 0.61–1.24)
Chloride: 106 mmol/L (ref 101–111)
GFR calc non Af Amer: >60 mL/min (ref >60)
GLUCOSE: 117 mg/dL — AB (ref 65–99)
Potassium: 3.9 mmol/L (ref 3.5–5.1)
Sodium: 138 mmol/L (ref 135–145)

## 2016-06-20 LAB — T4, FREE: Free T4: 0.89 ng/dL (ref 0.61–1.12)

## 2016-06-20 LAB — TROPONIN I: Troponin I: 0.03 ng/mL (ref <0.03)

## 2016-06-20 LAB — TSH: TSH: 0.116 u[IU]/mL — ABNORMAL LOW (ref 0.350–4.500)

## 2016-06-20 LAB — MAGNESIUM: Magnesium: 2 mg/dL (ref 1.7–2.4)

## 2016-06-20 MED ORDER — ORAL CARE MOUTH RINSE
15.0000 mL | Freq: Two times a day (BID) | OROMUCOSAL | Status: DC
  Administered 2016-06-20 – 2016-06-21 (×2): 15 mL via OROMUCOSAL

## 2016-06-20 MED ORDER — METOPROLOL SUCCINATE ER 25 MG PO TB24
25.0000 mg | ORAL_TABLET | Freq: Every day | ORAL | Status: DC
  Administered 2016-06-20: 25 mg via ORAL
  Filled 2016-06-20: qty 1

## 2016-06-20 MED ORDER — FAMOTIDINE 20 MG PO TABS
20.0000 mg | ORAL_TABLET | Freq: Every day | ORAL | Status: DC
  Administered 2016-06-20 – 2016-06-22 (×3): 20 mg via ORAL
  Filled 2016-06-20 (×3): qty 1

## 2016-06-20 MED ORDER — TRAMADOL HCL 50 MG PO TABS
50.0000 mg | ORAL_TABLET | Freq: Three times a day (TID) | ORAL | Status: DC | PRN
  Administered 2016-06-20: 50 mg via ORAL
  Filled 2016-06-20: qty 1

## 2016-06-20 MED ORDER — RANOLAZINE ER 500 MG PO TB12: 500.0000 mg | ORAL_TABLET | Freq: Two times a day (BID) | ORAL | Status: DC

## 2016-06-20 MED ORDER — ZOLPIDEM TARTRATE 5 MG PO TABS
5.0000 mg | ORAL_TABLET | Freq: Every evening | ORAL | Status: DC | PRN
  Administered 2016-06-20 – 2016-06-21 (×2): 5 mg via ORAL
  Filled 2016-06-20 (×2): qty 1

## 2016-06-20 MED ORDER — NITROGLYCERIN 0.4 MG SL SUBL: 0.4000 mg | SUBLINGUAL_TABLET | SUBLINGUAL | Status: DC | PRN

## 2016-06-20 MED ORDER — SODIUM CHLORIDE 0.9% FLUSH
3.0000 mL | Freq: Two times a day (BID) | INTRAVENOUS | Status: DC
  Administered 2016-06-20 – 2016-06-22 (×5): 3 mL via INTRAVENOUS

## 2016-06-20 MED ORDER — FUROSEMIDE 40 MG PO TABS
20.0000 mg | ORAL_TABLET | ORAL | Status: DC
  Administered 2016-06-21: 20 mg via ORAL
  Filled 2016-06-20: qty 1

## 2016-06-20 MED ORDER — SOTALOL HCL 120 MG PO TABS
120.0000 mg | ORAL_TABLET | Freq: Two times a day (BID) | ORAL | Status: DC
  Administered 2016-06-20 – 2016-06-22 (×4): 120 mg via ORAL
  Filled 2016-06-20 (×4): qty 1

## 2016-06-20 MED ORDER — ACETAMINOPHEN 325 MG PO TABS: 650.0000 mg | ORAL_TABLET | Freq: Four times a day (QID) | ORAL | Status: DC | PRN

## 2016-06-20 MED ORDER — PROMETHAZINE HCL 25 MG PO TABS: 25.0000 mg | ORAL_TABLET | Freq: Four times a day (QID) | ORAL | Status: DC | PRN

## 2016-06-20 MED ORDER — SIMETHICONE 80 MG PO CHEW
80.0000 mg | CHEWABLE_TABLET | ORAL | Status: DC | PRN
  Filled 2016-06-20: qty 1

## 2016-06-20 MED ORDER — APIXABAN 2.5 MG PO TABS: 2.5000 mg | ORAL_TABLET | Freq: Two times a day (BID) | ORAL | Status: DC

## 2016-06-20 MED ORDER — LOPERAMIDE HCL 2 MG PO CAPS
4.0000 mg | ORAL_CAPSULE | ORAL | Status: DC | PRN
  Administered 2016-06-20: 4 mg via ORAL
  Filled 2016-06-20: qty 2

## 2016-06-20 MED ORDER — MEXILETINE HCL 150 MG PO CAPS
300.0000 mg | ORAL_CAPSULE | Freq: Three times a day (TID) | ORAL | Status: DC
  Administered 2016-06-20 – 2016-06-22 (×8): 300 mg via ORAL
  Filled 2016-06-20 (×9): qty 2

## 2016-06-20 MED ORDER — METAXALONE 400 MG HALF TABLET
400.0000 mg | ORAL_TABLET | Freq: Every day | ORAL | Status: DC | PRN
  Administered 2016-06-20: 400 mg via ORAL
  Filled 2016-06-20 (×2): qty 1

## 2016-06-20 MED ORDER — LOPERAMIDE HCL 2 MG PO CAPS: 4.0000 mg | ORAL_CAPSULE | ORAL | Status: DC

## 2016-06-20 MED ORDER — ZOLPIDEM TARTRATE 5 MG PO TABS: 5.0000 mg | ORAL_TABLET | Freq: Every evening | ORAL | Status: DC | PRN

## 2016-06-20 NOTE — ED Notes (Signed)
Delay in lab draw pt using urinal

## 2016-06-20 NOTE — Telephone Encounter (Signed)
Attempt to return call-left message (ok per DPR) that I would make Dr. [name] aware although he is not rounding at the hospital today that he would see who is rounding.  Advised to call back with further questions or concerns.

## 2016-06-20 NOTE — H&P (Signed)
History & Physical    Patient ID: [name] MRN: [id number], DOB/AGE: 1945/12/06   Admit date: 06/19/2016   Primary Physician: Randy Nam, MD Primary Cardiologist: Randy Bunting, MD   History of Present Illness    [name] is a 71 y.o. male with past medical history of coronary artery disease status post potential percutaneous intervention prior complicated with likely ischemic cardiomyopathy with severe systolic dysfunction status post ICD, recurrent VT, paroxysmal atrial fibrillation, hypertension, prior tobacco abuse, and presumed COPD is here for ICD defibrillation.  [name] says at around 5 pm while standing up in his kitchen fixing a sandwich, he noticed his "ICD was charging".  He sat down to prepare himself for a shock and prior to being shocked, he became short of breath.  He eventually set down on his couch and within 10 minutes of his first shock, his ICD gave him another shock.  Within five minutes of his second shock, he says his ICD fired again.  Due to multiple shocks, his wife called 911 to the home.  He denies having any other symptoms prior to his ICD firing besides feeling short of breath.  Patient denies feeling lightheaded, dizzy, chest pain, palpitations, nausea, or sweating prior to his ICD firing.  After being shocked multiple times, he denies any other symptoms.  He says that he was shocked the day prior as well and had an appointment set up to see his electrophysiologist the following day. He does recall having some chest pain that lasted only 5 minutes while walking from the drug store earlier yesterday evening, but has not had any additional chest pain since then.   Patient says he has been taking all of his medications as prescribed.  He denies any swelling in his legs, shortness of breath, shortness of breath with laying flat, or waking up in the middle of the night due to shortness of breath.    Past Medical History    Past Medical  History:  Diagnosis Date  . Acute lower GI bleeding 12/11/2011   "first time" (12/11/2011)  . Acute myocardial infarction, unspecified site, episode of care unspecified 1995   Pt living in [place], no stent, ?PTCA  . AICD (automatic cardioverter/defibrillator) present   . Allergic rhinitis, cause unspecified   . Anxiety   . Arthritis    "all over" (11/07/2015)  . Atrial fibrillation (HCC)    a. 05/2015 - converted to sinus in setting of ICD shocks; placed on eliquis 5 bid.  [name] CAD (coronary artery disease), autologous vein bypass graft   . Cervical herniated disc    told not to lift >10 lbs  . Chronic systolic CHF (congestive heart failure), NYHA class 2 (HCC)    Reports EF of 25%.   [name] COPD (chronic obstructive pulmonary disease) (HCC) 11/2012   by xray  . Diverticulosis    by CT scan  . HCAP (healthcare-associated pneumonia) 11/06/2015  . Hypertension   . Insomnia   . Paroxysmal ventricular tachycardia (HCC)   . Perennial allergic rhinitis    only to dust mites  . Pneumonia 2000s   "walking pneumonia"  . VT (ventricular tachycardia) (HCC)    a. 05/2015 - VT storm with multiple ICD shocks-->Amio 400 BID.    Past Surgical History:  Procedure Laterality Date  . CARDIAC CATHETERIZATION  2004   LAD 30%, D1 30%, CFX-AV groove 70-80%, OM1 30%, EF 20-25%  . CARDIAC CATHETERIZATION N/A 09/28/2015   Procedure: Left Heart  Cath and Coronary Angiography;  Surgeon: Randy M Swaziland, MD;  Location: [facility] INVASIVE CV LAB;  Service: Cardiovascular;  Laterality: N/A;  . CARDIAC DEFIBRILLATOR PLACEMENT  2004  . CATARACT EXTRACTION W/ INTRAOCULAR LENS IMPLANT Right 01/2012  . COLONOSCOPY  01/08/2012   Procedure: COLONOSCOPY;  Surgeon: Randy Boop, MD;  Location: [facility] ENDOSCOPY;  Service: Endoscopy;  Laterality: N/A;  . CORONARY ANGIOPLASTY  1995   Pt thinks he got a balloon, living in [place], [place]  . IMPLANTABLE CARDIOVERTER DEFIBRILLATOR GENERATOR CHANGE N/A 02/07/2012   Procedure: IMPLANTABLE  CARDIOVERTER DEFIBRILLATOR GENERATOR CHANGE;  Surgeon: Randy Maw, MD; Medtronic Evera XT VR single-chamber serial number [id number], Laterality: Left  . INSERT / REPLACE / REMOVE PACEMAKER  2004   Medtronic ICD  . KNEE ARTHROSCOPY Left 05/2003   [name] 06/19/2010  . LAPAROSCOPIC CHOLECYSTECTOMY  1/ 2012  . SHOULDER ARTHROSCOPY W/ ROTATOR CUFF REPAIR Right twice  . TONSILLECTOMY AND ADENOIDECTOMY  ~ 1951  . V TACH ABLATION N/A 04/29/2016   Procedure: V Tach Ablation;  Surgeon: Randy Maw, MD;  Location: [facility] INVASIVE CV LAB;  Service: Cardiovascular;  Laterality: N/A;     Allergies  Allergies  Allergen Reactions  . Ace Inhibitors Other (See Comments)    muscle pain. Tolerates ARBs.   . Codeine Other (See Comments)    "head wants to explode."  . Doxycycline Diarrhea and Nausea And Vomiting  . Kionex [Sodium Polystyrene Sulfonate] Other (See Comments)    SOB, pressure in chest, weakness fatigue.   [name] Penicillins Swelling    "started at point of injection; w/in 3 min my upper arm was swollen 3 times normal" Has patient had a PCN reaction causing immediate rash, facial/tongue/throat swelling, SOB or lightheadedness with hypotension: Yes Has patient had a PCN reaction causing severe rash involving mucus membranes or skin necrosis: No Has patient had a PCN reaction that required hospitalization No Has patient had a PCN reaction occurring within the last 10 years: No If all of the above answers are "NO", then may proceed wi  . Lisinopril Other (See Comments)    Muscle Pain  . Statins Other (See Comments)    Myalgias per patient  . Hydrocodone Other (See Comments)    Intolerant, esp when combined with skelaxin.    . Lidocaine     Hallucinations, jerking  . Pacerone [Amiodarone] Other (See Comments)    Lung and heart problem  . Xanax [Alprazolam] Other (See Comments)    Nightmares.       Home Medications    Prior to Admission medications   Medication Sig Start Date End Date  Taking? Authorizing Provider  acetaminophen (TYLENOL) 325 MG tablet Take 2 tablets (650 mg total) by mouth every 6 (six) hours as needed for mild pain (or Fever >/= 101). 12/06/15  Yes Joseph Art, DO  apixaban (ELIQUIS) 2.5 MG TABS tablet Take 1 tablet (2.5 mg total) by mouth 2 (two) times daily. 04/05/16  Yes Randy Maw, MD  benzonatate (TESSALON) 200 MG capsule Take 1 capsule (200 mg total) by mouth every 4 (four) hours as needed for cough. 12/06/15  Yes Vann, Jessica U, DO  Chlorpheniramine-DM (CORICIDIN HBP COUGH/COLD PO) Take 2 capsules by mouth daily as needed (cold).    Yes [provider]  furosemide (LASIX) 20 MG tablet Take 1 tablet (20 mg total) by mouth every Monday, Wednesday, and Friday. 04/05/16  Yes Randy Maw, MD  guaiFENesin (MUCINEX) 600 MG 12 hr tablet Take  600 mg by mouth 2 (two) times daily.    Yes [provider]  loperamide (IMODIUM) 2 MG capsule Take 1 capsule (2 mg total) by mouth as needed for diarrhea or loose stools. Patient taking differently: Take 4 mg by mouth See admin instructions. Take 2 tablets after each bowl movement. 11/21/15  Yes Dorothea Ogle, MD  metaxalone (SKELAXIN) 800 MG tablet Take 1 tablet (800 mg total) by mouth daily as needed for muscle spasms. Patient taking differently: Take 400 mg by mouth daily as needed for muscle spasms.  02/12/16  Yes Randy Nam, MD  metoprolol succinate (TOPROL XL) 25 MG 24 hr tablet Take 1 tablet (25 mg total) by mouth daily. 04/05/16  Yes Randy Maw, MD  mexiletine (MEXITIL) 150 MG capsule Take 2 capsules (300 mg total) by mouth every 8 (eight) hours. 04/25/16  Yes Sheilah Pigeon, PA-C  nitroGLYCERIN (NITROSTAT) 0.4 MG SL tablet Place 1 tablet (0.4 mg total) under the tongue every 5 (five) minutes as needed for chest pain. 03/13/16  Yes Arty Baumgartner, NP  promethazine (PHENERGAN) 25 MG tablet Take 25 mg by mouth every 6 (six) hours as needed for nausea or vomiting.   Yes [provider]  RANEXA 500 MG 12 hr tablet TAKE 1 TABLET (500 MG TOTAL) BY MOUTH 2 (TWO) TIMES DAILY. 04/01/16  Yes Seiler, Amber K, NP  ranitidine (ZANTAC) 150 MG capsule Take 150 mg by mouth daily as needed for heartburn.    Yes [provider]  Simethicone (GAS-X PO) Take 1 tablet by mouth as needed (for gas). As needed.    Yes [provider]  traMADol (ULTRAM) 50 MG tablet Take 1 tablet (50 mg total) by mouth every 8 (eight) hours as needed (for pain). 05/16/16  Yes Randy Nam, MD  zolpidem (AMBIEN) 10 MG tablet Take 0.5-1 tablets (5-10 mg total) by mouth at bedtime as needed for sleep. 05/16/16 06/19/16 Yes Randy Nam, MD  budesonide-formoterol [facility]) 160-4.5 MCG/ACT inhaler Inhale 2 puffs into the lungs 2 (two) times daily. Patient not taking: Reported on 06/19/2016 05/20/16 06/19/16  Chilton Greathouse, MD  budesonide-formoterol (SYMBICORT) 160-4.5 MCG/ACT inhaler Inhale 2 puffs into the lungs 2 (two) times daily. Patient not taking: Reported on 06/19/2016 05/20/16 06/19/16  Chilton Greathouse, MD  NON FORMULARY Oxygen 3 liters   24/7    [provider]    Family History    Family History  Problem Relation Age of Onset  . Diabetes Father   . Tracheal cancer Father 47       smoker  . Stroke Mother   . Cancer Sister        left eye  . CAD Neg Hx   . Colon cancer Neg Hx   . Prostate cancer Neg Hx     Social History    Social History   Social History  . Marital status: Married    Spouse name: N/A  . Number of children: 0  . Years of education: N/A   Occupational History  . [employer] truck driver (retired)    Social History Main Topics  . Smoking status: Former Smoker    Packs/day: 0.50    Years: 50.00    Types: Cigarettes, Cigars    Quit date: 09/27/2015  . Smokeless tobacco: Never Used  . Alcohol use No  . Drug use: No  . Sexual activity: Not Currently   Other Topics Concern  . Not on file  Social History Narrative   Lives with wife,  married 1998   Grown children, 2 great grandchildren   Occupation: retired, was Presenter, broadcasting   Activity: walking, fishing   Diet: good water daily, fruits/vegetables rare      Wife is Product manager.    [id number], Human resources officer. No known agent orange exposure.       Review of Systems    All other systems reviewed and are otherwise negative except as noted above.  Physical Exam    Blood pressure 115/89, pulse (!) 114, temperature 97.8 F (36.6 C), temperature source Oral, resp. rate 16, height 5\' 3"  (1.6 m), weight 68 kg (150 lb), SpO2 95 %.  General: Well developed, well nourished,male in no acute distress. Head: Normocephalic, atraumatic, sclera non-icteric, no xanthomas, nares are without discharge. Dentition:  Neck: No carotid bruits. JVD not elevated.  Lungs: Respirations regular and unlabored, without wheezes or rales.  Heart: Regular rate and rhythm. No S3 or S4.  No murmur, no rubs, or gallops appreciated. Abdomen: Soft, non-tender, non-distended with normoactive bowel sounds. No hepatomegaly. No rebound/guarding. No obvious abdominal masses. Msk:  Strength and tone appear normal for age. No joint deformities or effusions. Extremities: No clubbing or cyanosis. No edema.  Distal pedal pulses are 2+ bilaterally. Neuro: Alert and oriented X 3. Moves all extremities spontaneously. No focal deficits noted. Psych:  Responds to questions appropriately with a normal affect. Skin: No rashes or lesions noted  Labs    Troponin (Point of Care Test) No results for input(s): TROPIPOC in the last 72 hours. No results for input(s): CKTOTAL, CKMB, TROPONINI in the last 72 hours. Lab Results  Component Value Date   WBC 8.5 06/19/2016   HGB 9.6 (L) 06/19/2016   HCT 32.7 (L) 06/19/2016   MCV 77.9 (L) 06/19/2016   PLT 340 06/19/2016    Recent Labs Lab 06/19/16 1857  NA 139  K 4.0  CL 108  CO2 23  BUN 22*  CREATININE 1.18  CALCIUM 8.5*  GLUCOSE 93   Lab Results  Component Value Date     CHOL 170 09/12/2014   HDL 41.10 09/12/2014   LDLCALC 105 (H) 09/12/2014   TRIG 117.0 09/12/2014   No results found for: DDIMER   B Natriuretic Peptide  Date/Time Value Ref Range Status  06/19/2016 06:57 PM 322.2 (H) 0.0 - 100.0 pg/mL Final  03/10/2016 12:04 AM 165.6 (H) 0.0 - 100.0 pg/mL Final  11/28/2015 07:37 AM 494.0 (H) 0.0 - 100.0 pg/mL Final   Pro B Natriuretic peptide (BNP)  Date/Time Value Ref Range Status  02/12/2011 04:14 PM 291.0 (H) 0.0 - 100.0 pg/mL Final  07/30/2010 12:07 PM 277.0 (H) 0.0 - 100.0 pg/mL Final   No results for input(s): INR in the last 72 hours.    Radiology Studies    Dg Chest 2 View  Result Date: 06/19/2016 CLINICAL DATA:  ICD device deployed 3 times today. No chest complaints. EXAM: CHEST  2 VIEW COMPARISON:  04/25/2016 FINDINGS: Stable cardiomegaly with uncoiled tortuous appearance of the thoracic aorta. ICD device projects over the left mid anterior the chest wall with single right defibrillator lead in the right ventricle. No overt pulmonary edema, pneumonic consolidation, effusion or pneumothorax. No acute nor suspicious osseous lesions. Partially resected appearance of the distal right clavicle. IMPRESSION: No active cardiopulmonary disease. ICD device in place with stable cardiomegaly. Electronically Signed   By: Tollie Eth M.D.   On: 06/19/2016 19:20    EKG & Cardiac Imaging  EKG:  06/19/16 (18:29):  NSR; 1st degree AV block; right axis deviation; intraventricular conduction delay 06/20/16 (00:48):  Atrial tachycardia; LBBB  ECHOCARDIOGRAM: 11/29/16 - Left ventricle: The cavity size was mildly dilated. Wall   thickness was normal. Systolic function was severely reduced. The   estimated ejection fraction was in the range of 25% to 30%. There   is akinesis of the inferolateral and inferior myocardium.   Features are consistent with a pseudonormal left ventricular   filling pattern, with concomitant abnormal relaxation and   increased  filling pressure (grade 2 diastolic dysfunction).   Doppler parameters are consistent with high ventricular filling   pressure. - Aortic valve: There was mild regurgitation. - Mitral valve: There was severe regurgitation. - Left atrium: The atrium was moderately dilated. - Right atrium: The atrium was moderately dilated. - Tricuspid valve: There was moderate regurgitation. - Pulmonary arteries: Systolic pressure was moderately increased.  Assessment & Plan    Active Problems:   VT (ventricular tachycardia) (HCC)   Coronary artery disease involving native coronary artery of native heart without angina pectoris   ICD (implantable cardioverter-defibrillator) discharge   Atrial tachycardia (HCC)   Ventricular tachycardia (HCC)  # ICD defibrillation ICD interrogation shows at total of 5 successful defibrillation attempts from his AICD since 06/16/16 (VT zone >200 bpm).  Likely secondary to his cardiomyopathy in the setting of electrolytes being normal.  Of note, patient had thyroid function labs in 2017 that was concerning for potential hyperthyroidism and should be repeated.  He is without chest pain and ECG is without changes that would represent myocardial ischemia/infarction, so low suspicion for ischemia induced arrhythmia.  Patient is stable at this time. - Continue mexiletine 300 mg/tid and home metoprolol for now. - Contact electrophysiology in the morning for further evaluation.    # Ventricular tachycardia Patient with likely ventricular tachycardia by AICD interrogation.  Ventricular tachycardia likely due to underlying cardiomyopathy. - Management as above.  # Coronary artery disease Patient with known coronary artery disease with stable angina.  He is here now for likely recurrent ventricular tachycardia.  He is not on statin therapy due to documented statin intolerance.    Patient does not take aspirin but is on apixaban.   - No additional management at this time. - Can  consider a non-statin such as Zetia.    # Atrial tachycardia Patient with ECG that has changes concerning for atrial tachycardia with aberrancy with a heart rate around 115 bpm.  Patient is asymptomatic at this time and hemodynamically stable. - Continue telemetry for now.   - Continue metoprolol  # Cardiomyopathy Patient with ischemic cardiomyopathy with severe systolic dysfunction.  Likely etiology for his recurrent ventricular tachycardia.  He is currently asymptomatic and does not appear to have hemodynamic congestion on examination.  He is unable to take ACEi, and unclear if ARBs were tried prior.  He does take metoprolol daily. - Continue metoprolol. - Continue home Lasix regimen.    # Hypertension Patient with underlying essential hypertension.  Blood pressure is at goal at this time. Appears to have stage II chronic kidney disease.  Blood pressure is at goal at this time. - Continue home blood pressure medications.     # Prophylaxis - Lovenox subQ  Signed, Judie Grieve, MD 06/20/2016, 2:14 AM

## 2016-06-20 NOTE — ED Notes (Addendum)
Vtach noted on the cardiac monitor at 0359 and again at 0402 with patient immediately returning to sinus rythm, this RN in to assess patient. Pt a/ox4, resp e/u, denied chest pain or feeling his ICD shock him. Dr. Orson Aloe paged to make aware

## 2016-06-20 NOTE — ED Provider Notes (Signed)
[facility]-EMERGENCY DEPT Provider Note   CSN: [id number] Arrival date & time: 06/19/16  1817     History   Chief Complaint Chief Complaint  Patient presents with  . Pacemaker Problem    HPI QUASIM DOYON is a 71 y.o. male.  The history is provided by the patient, medical records and a relative.  Shortness of Breath  This is a recurrent problem. The problem occurs intermittently.The problem has been resolved. Associated symptoms include chest pain. Pertinent negatives include no syncope, no vomiting and no leg swelling.    Past Medical History:  Diagnosis Date  . Acute lower GI bleeding 12/11/2011   "first time" (12/11/2011)  . Acute myocardial infarction, unspecified site, episode of care unspecified 1995   Pt living in [place], no stent, ?PTCA  . AICD (automatic cardioverter/defibrillator) present   . Allergic rhinitis, cause unspecified   . Anxiety   . Arthritis    "all over" (11/07/2015)  . Atrial fibrillation (HCC)    a. 05/2015 - converted to sinus in setting of ICD shocks; placed on eliquis 5 bid.  [name] CAD (coronary artery disease), autologous vein bypass graft   . Cervical herniated disc    told not to lift >10 lbs  . Chronic systolic CHF (congestive heart failure), NYHA class 2 (HCC)    Reports EF of 25%.   [name] COPD (chronic obstructive pulmonary disease) (HCC) 11/2012   by xray  . Diverticulosis    by CT scan  . HCAP (healthcare-associated pneumonia) 11/06/2015  . Hypertension   . Insomnia   . Paroxysmal ventricular tachycardia (HCC)   . Perennial allergic rhinitis    only to dust mites  . Pneumonia 2000s   "walking pneumonia"  . VT (ventricular tachycardia) (HCC)    a. 05/2015 - VT storm with multiple ICD shocks-->Amio 400 BID.    Patient Active Problem List   Diagnosis Date Noted  . ICD (implantable cardioverter-defibrillator) discharge 06/20/2016  . Atrial tachycardia (HCC) 06/20/2016  . Ventricular tachycardia (HCC) 06/20/2016  . Hyperkalemia 02/13/2016   . AKI (acute kidney injury) (HCC)   . Centrilobular emphysema (HCC)   . Postcholecystectomy diarrhea 12/12/2015  . Hypoalbuminemia due to protein-calorie malnutrition (HCC)   . Anemia of chronic disease   . Chronic systolic CHF (congestive heart failure) (HCC)   . Coronary artery disease involving native coronary artery of native heart without angina pectoris   . S/P implantation of automatic cardioverter/defibrillator (AICD)   . Supplemental oxygen dependent   . Physical deconditioning 11/28/2015  . Transaminitis 11/28/2015  . Hypoxia   . Fall at home 10/11/2015  . VT (ventricular tachycardia) (HCC) 09/27/2015  . Memory loss 09/24/2015  . Encounter for screening examination for infectious disease 09/24/2015  . Chronic atrial fibrillation (HCC) 05/28/2015  . Syncope 05/12/2015  . Advance care planning 09/16/2014  . Muscle ache 09/16/2014  . Back pain 09/08/2013  . Abdominal pain, chronic, epigastric 09/08/2013  . Fatigue 06/11/2013  . Chest wall mass 11/05/2012  . Arthritis   . Insomnia   . Anxiety   . Ischemic cardiomyopathy 02/12/2011  . Acute on chronic systolic congestive heart failure (HCC) 05/10/2010  . Hyperlipidemia 08/25/2008  . Amiodarone pulmonary toxicity 08/25/2008    Past Surgical History:  Procedure Laterality Date  . CARDIAC CATHETERIZATION  2004   LAD 30%, D1 30%, CFX-AV groove 70-80%, OM1 30%, EF 20-25%  . CARDIAC CATHETERIZATION N/A 09/28/2015   Procedure: Left Heart Cath and Coronary Angiography;  Surgeon: Peter M Swaziland, MD;  Location: [facility] INVASIVE CV LAB;  Service: Cardiovascular;  Laterality: N/A;  . CARDIAC DEFIBRILLATOR PLACEMENT  2004  . CATARACT EXTRACTION W/ INTRAOCULAR LENS IMPLANT Right 01/2012  . COLONOSCOPY  01/08/2012   Procedure: COLONOSCOPY;  Surgeon: Iva Boop, MD;  Location: [facility] ENDOSCOPY;  Service: Endoscopy;  Laterality: N/A;  . CORONARY ANGIOPLASTY  1995   Pt thinks he got a balloon, living in [place], [place]  . IMPLANTABLE  CARDIOVERTER DEFIBRILLATOR GENERATOR CHANGE N/A 02/07/2012   Procedure: IMPLANTABLE CARDIOVERTER DEFIBRILLATOR GENERATOR CHANGE;  Surgeon: Marinus Maw, MD; Medtronic Evera XT VR single-chamber serial number [id number], Laterality: Left  . INSERT / REPLACE / REMOVE PACEMAKER  2004   Medtronic ICD  . KNEE ARTHROSCOPY Left 05/2003   Hattie Perch 06/19/2010  . LAPAROSCOPIC CHOLECYSTECTOMY  1/ 2012  . SHOULDER ARTHROSCOPY W/ ROTATOR CUFF REPAIR Right twice  . TONSILLECTOMY AND ADENOIDECTOMY  ~ 1951  . V TACH ABLATION N/A 04/29/2016   Procedure: V Tach Ablation;  Surgeon: Marinus Maw, MD;  Location: [facility] INVASIVE CV LAB;  Service: Cardiovascular;  Laterality: N/A;       Home Medications    Prior to Admission medications   Medication Sig Start Date End Date Taking? Authorizing Provider  acetaminophen (TYLENOL) 325 MG tablet Take 2 tablets (650 mg total) by mouth every 6 (six) hours as needed for mild pain (or Fever >/= 101). 12/06/15  Yes Joseph Art, DO  apixaban (ELIQUIS) 2.5 MG TABS tablet Take 1 tablet (2.5 mg total) by mouth 2 (two) times daily. 04/05/16  Yes Marinus Maw, MD  benzonatate (TESSALON) 200 MG capsule Take 1 capsule (200 mg total) by mouth every 4 (four) hours as needed for cough. 12/06/15  Yes Vann, Jessica U, DO  Chlorpheniramine-DM (CORICIDIN HBP COUGH/COLD PO) Take 2 capsules by mouth daily as needed (cold).    Yes [provider]  furosemide (LASIX) 20 MG tablet Take 1 tablet (20 mg total) by mouth every Monday, Wednesday, and Friday. 04/05/16  Yes Marinus Maw, MD  guaiFENesin (MUCINEX) 600 MG 12 hr tablet Take 600 mg by mouth 2 (two) times daily.    Yes [provider]  loperamide (IMODIUM) 2 MG capsule Take 1 capsule (2 mg total) by mouth as needed for diarrhea or loose stools. Patient taking differently: Take 4 mg by mouth See admin instructions. Take 2 tablets after each bowl movement. 11/21/15  Yes Dorothea Ogle, MD  metaxalone (SKELAXIN) 800 MG  tablet Take 1 tablet (800 mg total) by mouth daily as needed for muscle spasms. Patient taking differently: Take 400 mg by mouth daily as needed for muscle spasms.  02/12/16  Yes Joaquim Nam, MD  metoprolol succinate (TOPROL XL) 25 MG 24 hr tablet Take 1 tablet (25 mg total) by mouth daily. 04/05/16  Yes Marinus Maw, MD  mexiletine (MEXITIL) 150 MG capsule Take 2 capsules (300 mg total) by mouth every 8 (eight) hours. 04/25/16  Yes Sheilah Pigeon, PA-C  nitroGLYCERIN (NITROSTAT) 0.4 MG SL tablet Place 1 tablet (0.4 mg total) under the tongue every 5 (five) minutes as needed for chest pain. 03/13/16  Yes Arty Baumgartner, NP  promethazine (PHENERGAN) 25 MG tablet Take 25 mg by mouth every 6 (six) hours as needed for nausea or vomiting.   Yes [provider]  RANEXA 500 MG 12 hr tablet TAKE 1 TABLET (500 MG TOTAL) BY MOUTH 2 (TWO) TIMES DAILY. 04/01/16  Yes Marily Lente, NP  ranitidine (  ZANTAC) 150 MG capsule Take 150 mg by mouth daily as needed for heartburn.    Yes [provider]  Simethicone (GAS-X PO) Take 1 tablet by mouth as needed (for gas). As needed.    Yes [provider]  traMADol (ULTRAM) 50 MG tablet Take 1 tablet (50 mg total) by mouth every 8 (eight) hours as needed (for pain). 05/16/16  Yes Joaquim Nam, MD  zolpidem (AMBIEN) 10 MG tablet Take 0.5-1 tablets (5-10 mg total) by mouth at bedtime as needed for sleep. 05/16/16 06/19/16 Yes Joaquim Nam, MD  budesonide-formoterol [facility]) 160-4.5 MCG/ACT inhaler Inhale 2 puffs into the lungs 2 (two) times daily. Patient not taking: Reported on 06/19/2016 05/20/16 06/19/16  Chilton Greathouse, MD  budesonide-formoterol (SYMBICORT) 160-4.5 MCG/ACT inhaler Inhale 2 puffs into the lungs 2 (two) times daily. Patient not taking: Reported on 06/19/2016 05/20/16 06/19/16  Chilton Greathouse, MD  NON FORMULARY Oxygen 3 liters   24/7    [provider]    Family History Family History  Problem Relation Age  of Onset  . Diabetes Father   . Tracheal cancer Father 44       smoker  . Stroke Mother   . Cancer Sister        left eye  . CAD Neg Hx   . Colon cancer Neg Hx   . Prostate cancer Neg Hx     Social History Social History  Substance Use Topics  . Smoking status: Former Smoker    Packs/day: 0.50    Years: 50.00    Types: Cigarettes, Cigars    Quit date: 09/27/2015  . Smokeless tobacco: Never Used  . Alcohol use No     Allergies   Ace inhibitors; Codeine; Doxycycline; Kionex [sodium polystyrene sulfonate]; Penicillins; Lisinopril; Statins; Hydrocodone; Lidocaine; Pacerone [amiodarone]; and Xanax [alprazolam]   Review of Systems Review of Systems  Respiratory: Positive for shortness of breath.   Cardiovascular: Positive for chest pain and palpitations. Negative for leg swelling and syncope.  Gastrointestinal: Negative for vomiting.  All other systems reviewed and are negative.    Physical Exam Updated Vital Signs BP 110/84   Pulse (!) 118   Temp 97.8 F (36.6 C) (Oral)   Resp 12   Ht 5\' 3"  (1.6 m)   Wt 150 lb (68 kg)   SpO2 98%   BMI 26.57 kg/m   Physical Exam  Constitutional: He appears well-developed and well-nourished.  HENT:  Head: Normocephalic and atraumatic.  Eyes: Conjunctivae are normal.  Neck: Normal range of motion.  Cardiovascular: Normal rate.   Pulmonary/Chest: Effort normal. No respiratory distress.  Abdominal: Soft. He exhibits no distension.  Musculoskeletal: Normal range of motion. He exhibits no edema or deformity.  Neurological: He is alert.  Skin: Skin is warm and dry.  Psychiatric: He has a normal mood and affect.  Nursing note and vitals reviewed.    ED Treatments / Results  Labs (all labs ordered are listed, but only abnormal results are displayed) Labs Reviewed  CBC WITH DIFFERENTIAL/PLATELET - Abnormal; Notable for the following:       Result Value   RBC 4.20 (*)    Hemoglobin 9.6 (*)    HCT 32.7 (*)    MCV 77.9 (*)     MCH 22.9 (*)    MCHC 29.4 (*)    RDW 16.9 (*)    All other components within normal limits  BASIC METABOLIC PANEL - Abnormal; Notable for the following:    BUN  22 (*)    Calcium 8.5 (*)    GFR calc non Af Amer 60 (*)    All other components within normal limits  BRAIN NATRIURETIC PEPTIDE - Abnormal; Notable for the following:    B Natriuretic Peptide 322.2 (*)    All other components within normal limits  MAGNESIUM  TROPONIN I  MAGNESIUM  BASIC METABOLIC PANEL  CBC  TSH  T3, FREE  T4, FREE    EKG  EKG Interpretation  Date/Time:  Thursday Jun 20 2016 00:48:33 EDT Ventricular Rate:  113 PR Interval:    QRS Duration: 217 QT Interval:  447 QTC Calculation: 613 R Axis:   27 Text Interpretation:  AV dissociation IVCD, consider atypical LBBB Confirmed by Marily Memos [phone]) on 06/20/2016 2:28:56 AM       Radiology Dg Chest 2 View  Result Date: 06/19/2016 CLINICAL DATA:  ICD device deployed 3 times today. No chest complaints. EXAM: CHEST  2 VIEW COMPARISON:  04/25/2016 FINDINGS: Stable cardiomegaly with uncoiled tortuous appearance of the thoracic aorta. ICD device projects over the left mid anterior the chest wall with single right defibrillator lead in the right ventricle. No overt pulmonary edema, pneumonic consolidation, effusion or pneumothorax. No acute nor suspicious osseous lesions. Partially resected appearance of the distal right clavicle. IMPRESSION: No active cardiopulmonary disease. ICD device in place with stable cardiomegaly. Electronically Signed   By: Tollie Eth M.D.   On: 06/19/2016 19:20    Procedures Procedures (including critical care time)  Medications Ordered in ED Medications  apixaban (ELIQUIS) tablet 2.5 mg (2.5 mg Oral Given 06/19/16 2259)  ranolazine (RANEXA) 12 hr tablet 500 mg (500 mg Oral Given 06/19/16 2259)  guaiFENesin (MUCINEX) 12 hr tablet 600 mg (600 mg Oral Given 06/19/16 2259)  simethicone (MYLICON) chewable tablet 80 mg (not  administered)  traMADol (ULTRAM) tablet 50 mg (not administered)  mexiletine (MEXITIL) capsule 300 mg (not administered)  furosemide (LASIX) tablet 20 mg (not administered)  metoprolol succinate (TOPROL-XL) 24 hr tablet 25 mg (not administered)  nitroGLYCERIN (NITROSTAT) SL tablet 0.4 mg (not administered)  metaxalone (SKELAXIN) tablet 400 mg (not administered)  promethazine (PHENERGAN) tablet 25 mg (not administered)  acetaminophen (TYLENOL) tablet 650 mg (not administered)  loperamide (IMODIUM) capsule 4 mg (not administered)  famotidine (PEPCID) tablet 20 mg (not administered)  zolpidem (AMBIEN) tablet 5-10 mg (not administered)  sodium chloride flush (NS) 0.9 % injection 3 mL (not administered)     Initial Impression / Assessment and Plan / ED Course  I have reviewed the triage vital signs and the nursing notes.  Pertinent labs & imaging results that were available during my care of the patient were reviewed by me and considered in my medical decision making (see chart for details).     Patient here with multiple episodes of chest feeling hot followed by sob and then firing of his defibrillator. No significant issues otherwise.  Initially, discussed case with Dr. Purvis Sheffield who recommended: CXR, bmp, magnesium, cbc and then discuss with EP after results.  These results were unremarkable, attempted to consult cardiology on call fellow who gave me pager number for Dr. Graciela Husbands.  Proceeded to page Dr. Graciela Husbands 4 times over 1.5 hours and did not receive a call back.  Called Dr. Lubertha Basque office to find out who was on call and once again received phone call from on call cardiology fellow who recommended reconsulting EP.  At this time patient had acute onset of chest pain with a change in his rhythm  to a non-sinus rhythm with different morphology worrisome for a slow vtach. Troponin added on. Patient with normal vs and appeared well. reconsulted cardiology on call fellow who agreed to admission  and management.   Final Clinical Impressions(s) / ED Diagnoses   Final diagnoses:  Defibrillator discharge     Virtie Bungert, Barbara Cower, MD 06/20/16 [id number]

## 2016-06-20 NOTE — Consult Note (Signed)
ELECTROPHYSIOLOGY CONSULT NOTE    Patient ID: [name] MRN: [id number], DOB/AGE: [birth year] 71 y.o.  Admit date: 06/19/2016 Date of Consult: 06/20/2016   Primary Physician: Joaquim Nam, MD Primary Cardiologist: Dr. [name]   HPI: [name] is a 71 y.o. male who is being seen today for the evaluation of VT, ICD shocks at the request of Dr. Orson Aloe  PMHx includes: CAD/ICM, multiple recurrent VT storms with numerous shocks, known multiple different VT's, PAFib, COPD, anxiety, late 2017 number of hospitalizations with Pna, ? Ultimately amio tox.  The patient had recevied 2 shocks over the weekend, he sent a remote transmission and had an appointment today to have some programming changes that Dr. [name] had recommended though yesterday had more and came in.  He has been at his baseline of late, denies any kind of CP or changes in his exertional capacity, no unusual SOB or rapid weight gains, he denies symptoms of PND or orthopnea.  He had not been doing anything unusual or taking new medicines or any missed medicines.  LABS: K+ 3.9 Mag 2.0 BUN/Creat 21/1.16 Trop I: 0.03 WBC 10.2 H/H 10.2/34.7 plts 354 TSH 0.116 Free T4 0.89   Device information/history: MDT single chamber ICD, implanted 08/26/02, Dr. [name], VT 11/29/15 started on mexiletine/Ranexa 11/28/15: VT which degenerated into VF with ATP treatment and he received ICD shock 09/27/15: VT episode that he received 2 ATP therapies for and 1 shock, a number of NSVT episodes, and another VT episode he received ATP for that slowed the tachycardia. Morphology appears that he has 2 different VT's VT storm July 2017 VT storm in April 2017 Feb 2018 VT w/appropriate therapies 04/29/16 EPS/ABlation, noting multiple VT's, noted a VT very close to the HIS not ablated AAD tx:  amiodarone loaded/started July 2017 discontinued 2/2 suspect lung tox >> Mexiletine and Ranexa Hallucinations with lidocaine Feb Quinidine  Gluconate stopped ? GI intolerance   Carelink transmission notes:  06/19/16: 6 VT episodes tx with ATP successfully, 3 VT failed ATP > shock 06/18/16: 1 VT failed ATP > shock 06/16/16: 1 VT tx with ATP failed > shock 06/15/16: 4 VT tx w/ATP  Battery status is OK  Past Medical History:  Diagnosis Date  . Acute lower GI bleeding 12/11/2011   "first time" (12/11/2011)  . Acute myocardial infarction, unspecified site, episode of care unspecified 1995   Pt living in [place], no stent, ?PTCA  . AICD (automatic cardioverter/defibrillator) present   . Allergic rhinitis, cause unspecified   . Anxiety   . Arthritis    "all over" (11/07/2015)  . Atrial fibrillation (HCC)    a. 05/2015 - converted to sinus in setting of ICD shocks; placed on eliquis 5 bid.  [name] CAD (coronary artery disease), autologous vein bypass graft   . Cervical herniated disc    told not to lift >10 lbs  . Chronic systolic CHF (congestive heart failure), NYHA class 2 (HCC)    Reports EF of 25%.   [name] COPD (chronic obstructive pulmonary disease) (HCC) 11/2012   by xray  . Diverticulosis    by CT scan  . HCAP (healthcare-associated pneumonia) 11/06/2015  . Hypertension   . Insomnia   . Paroxysmal ventricular tachycardia (HCC)   . Perennial allergic rhinitis    only to dust mites  . Pneumonia 2000s   "walking pneumonia"  . VT (ventricular tachycardia) (HCC)    a. 05/2015 - VT storm with multiple ICD shocks-->Amio 400 BID.     Surgical  History:  Past Surgical History:  Procedure Laterality Date  . CARDIAC CATHETERIZATION  2004   LAD 30%, D1 30%, CFX-AV groove 70-80%, OM1 30%, EF 20-25%  . CARDIAC CATHETERIZATION N/A 09/28/2015   Procedure: Left Heart Cath and Coronary Angiography;  Surgeon: Peter M Swaziland, MD;  Location: [facility] INVASIVE CV LAB;  Service: Cardiovascular;  Laterality: N/A;  . CARDIAC DEFIBRILLATOR PLACEMENT  2004  . CATARACT EXTRACTION W/ INTRAOCULAR LENS IMPLANT Right 01/2012  . COLONOSCOPY  01/08/2012    Procedure: COLONOSCOPY;  Surgeon: Iva Boop, MD;  Location: [facility] ENDOSCOPY;  Service: Endoscopy;  Laterality: N/A;  . CORONARY ANGIOPLASTY  1995   Pt thinks he got a balloon, living in [place], [place]  . IMPLANTABLE CARDIOVERTER DEFIBRILLATOR GENERATOR CHANGE N/A 02/07/2012   Procedure: IMPLANTABLE CARDIOVERTER DEFIBRILLATOR GENERATOR CHANGE;  Surgeon: Marinus Maw, MD; Medtronic Evera XT VR single-chamber serial number [id number], Laterality: Left  . INSERT / REPLACE / REMOVE PACEMAKER  2004   Medtronic ICD  . KNEE ARTHROSCOPY Left 05/2003   Hattie Perch 06/19/2010  . LAPAROSCOPIC CHOLECYSTECTOMY  1/ 2012  . SHOULDER ARTHROSCOPY W/ ROTATOR CUFF REPAIR Right twice  . TONSILLECTOMY AND ADENOIDECTOMY  ~ 1951  . V TACH ABLATION N/A 04/29/2016   Procedure: V Tach Ablation;  Surgeon: Marinus Maw, MD;  Location: [facility] INVASIVE CV LAB;  Service: Cardiovascular;  Laterality: N/A;     Prescriptions Prior to Admission  Medication Sig Dispense Refill Last Dose  . acetaminophen (TYLENOL) 325 MG tablet Take 2 tablets (650 mg total) by mouth every 6 (six) hours as needed for mild pain (or Fever >/= 101).   Past Month at Unknown time  . apixaban (ELIQUIS) 2.5 MG TABS tablet Take 1 tablet (2.5 mg total) by mouth 2 (two) times daily. 180 tablet 3 06/19/2016 at 0800  . benzonatate (TESSALON) 200 MG capsule Take 1 capsule (200 mg total) by mouth every 4 (four) hours as needed for cough. 20 capsule 0 Past Month at Unknown time  . Chlorpheniramine-DM (CORICIDIN HBP COUGH/COLD PO) Take 2 capsules by mouth daily as needed (cold).    Past Month at Unknown time  . furosemide (LASIX) 20 MG tablet Take 1 tablet (20 mg total) by mouth every Monday, Wednesday, and Friday. 45 tablet 3 06/19/2016 at Unknown time  . guaiFENesin (MUCINEX) 600 MG 12 hr tablet Take 600 mg by mouth 2 (two) times daily.    06/19/2016 at Unknown time  . loperamide (IMODIUM) 2 MG capsule Take 1 capsule (2 mg total) by mouth as needed for diarrhea or loose  stools. (Patient taking differently: Take 4 mg by mouth See admin instructions. Take 2 tablets after each bowl movement.) 30 capsule 0 Past Month at Unknown time  . metaxalone (SKELAXIN) 800 MG tablet Take 1 tablet (800 mg total) by mouth daily as needed for muscle spasms. (Patient taking differently: Take 400 mg by mouth daily as needed for muscle spasms. ) 30 tablet 1 Past Month at Unknown time  . metoprolol succinate (TOPROL XL) 25 MG 24 hr tablet Take 1 tablet (25 mg total) by mouth daily. 90 tablet 3 06/19/2016 at 0800  . mexiletine (MEXITIL) 150 MG capsule Take 2 capsules (300 mg total) by mouth every 8 (eight) hours. 180 capsule 3 06/19/2016 at Unknown time  . nitroGLYCERIN (NITROSTAT) 0.4 MG SL tablet Place 1 tablet (0.4 mg total) under the tongue every 5 (five) minutes as needed for chest pain. 25 tablet 1 06/19/2016 at Unknown time  . promethazine (  PHENERGAN) 25 MG tablet Take 25 mg by mouth every 6 (six) hours as needed for nausea or vomiting.   Past Month at Unknown time  . RANEXA 500 MG 12 hr tablet TAKE 1 TABLET (500 MG TOTAL) BY MOUTH 2 (TWO) TIMES DAILY. 180 tablet 3 06/19/2016 at Unknown time  . ranitidine (ZANTAC) 150 MG capsule Take 150 mg by mouth daily as needed for heartburn.    Past Month at Unknown time  . Simethicone (GAS-X PO) Take 1 tablet by mouth as needed (for gas). As needed.    Past Month at Unknown time  . traMADol (ULTRAM) 50 MG tablet Take 1 tablet (50 mg total) by mouth every 8 (eight) hours as needed (for pain). 100 tablet 1 Past Month at Unknown time  . zolpidem (AMBIEN) 10 MG tablet Take 0.5-1 tablets (5-10 mg total) by mouth at bedtime as needed for sleep. 30 tablet 1 Past Month at Unknown time  . budesonide-formoterol (SYMBICORT) 160-4.5 MCG/ACT inhaler Inhale 2 puffs into the lungs 2 (two) times daily. (Patient not taking: Reported on 06/19/2016) 1 Inhaler 0 Not Taking at Unknown time  . budesonide-formoterol (SYMBICORT) 160-4.5 MCG/ACT inhaler Inhale 2 puffs into the  lungs 2 (two) times daily. (Patient not taking: Reported on 06/19/2016) 1 Inhaler 6 Not Taking at Unknown time  . NON FORMULARY Oxygen 3 liters   24/7   Taking    Inpatient Medications:  . apixaban  2.5 mg Oral BID  . famotidine  20 mg Oral Daily  . [START ON 06/21/2016] furosemide  20 mg Oral Q M,W,F  . guaiFENesin  600 mg Oral BID  . mouth rinse  15 mL Mouth Rinse BID  . metoprolol succinate  25 mg Oral Daily  . mexiletine  300 mg Oral Q8H  . ranolazine  500 mg Oral BID  . sodium chloride flush  3 mL Intravenous Q12H    Allergies:  Allergies  Allergen Reactions  . Ace Inhibitors Other (See Comments)    muscle pain. Tolerates ARBs.   . Codeine Other (See Comments)    "head wants to explode."  . Doxycycline Diarrhea and Nausea And Vomiting  . Kionex [Sodium Polystyrene Sulfonate] Other (See Comments)    SOB, pressure in chest, weakness fatigue.   [name] Penicillins Swelling    "started at point of injection; w/in 3 min my upper arm was swollen 3 times normal" Has patient had a PCN reaction causing immediate rash, facial/tongue/throat swelling, SOB or lightheadedness with hypotension: Yes Has patient had a PCN reaction causing severe rash involving mucus membranes or skin necrosis: No Has patient had a PCN reaction that required hospitalization No Has patient had a PCN reaction occurring within the last 10 years: No If all of the above answers are "NO", then may proceed wi  . Lisinopril Other (See Comments)    Muscle Pain  . Statins Other (See Comments)    Myalgias per patient  . Hydrocodone Other (See Comments)    Intolerant, esp when combined with skelaxin.    . Lidocaine     Hallucinations, jerking  . Pacerone [Amiodarone] Other (See Comments)    Lung and heart problem  . Xanax [Alprazolam] Other (See Comments)    Nightmares.      Social History   Social History  . Marital status: Married    Spouse name: N/A  . Number of children: 0  . Years of education: N/A    Occupational History  . [employer] truck driver (retired)  Social History Main Topics  . Smoking status: Former Smoker    Packs/day: 0.50    Years: 50.00    Types: Cigarettes, Cigars    Quit date: 09/27/2015  . Smokeless tobacco: Never Used  . Alcohol use No  . Drug use: No  . Sexual activity: Not Currently   Other Topics Concern  . Not on file   Social History Narrative   Lives with wife, married 1998   Grown children, 2 great grandchildren   Occupation: retired, was Presenter, broadcasting   Activity: walking, fishing   Diet: good water daily, fruits/vegetables rare      Wife is Product manager.    [id number], Human resources officer. No known agent orange exposure.       Family History  Problem Relation Age of Onset  . Diabetes Father   . Tracheal cancer Father 8       smoker  . Stroke Mother   . Cancer Sister        left eye  . CAD Neg Hx   . Colon cancer Neg Hx   . Prostate cancer Neg Hx      Review of Systems: All other systems reviewed and are otherwise negative except as noted above.  Physical Exam: Vitals:   06/20/16 0513 06/20/16 0550 06/20/16 0623 06/20/16 0729  BP: (!) 92/56 102/69 114/67 115/66  Pulse: 70 69 68 67  Resp: 20 16 18 17   Temp:   97.7 F (36.5 C) 97.9 F (36.6 C)  TempSrc:   Oral Oral  SpO2: 95% 97% 97% 98%  Weight:      Height:        GEN- The patient is chronically ill appearing, alert and oriented x 3 today.   HEENT: normocephalic, atraumatic; sclera clear, conjunctiva pink; hearing intact; oropharynx clear; neck supple, no JVP Lymph- no cervical lymphadenopathy Lungs- CTA b/l, normal work of breathing.  No wheezes, rales, rhonchi Heart- RRR, 1/6SM, rubs or gallops, PMI not laterally displaced GI- soft, non-tender, non-distended Extremities- no clubbing, cyanosis, or edema MS- no significant deformity or atrophy Skin- warm and dry, no rash or lesion Psych- euthymic mood, full affect Neuro- no gross deficits observed  Labs:   Lab Results  Component  Value Date   WBC 10.2 06/20/2016   HGB 10.2 (L) 06/20/2016   HCT 34.7 (L) 06/20/2016   MCV 77.6 (L) 06/20/2016   PLT 354 06/20/2016    Recent Labs Lab 06/20/16 0325  NA 138  K 3.9  CL 106  CO2 22  BUN 21*  CREATININE 1.16  CALCIUM 8.6*  GLUCOSE 117*      Radiology/Studies:  Dg Chest 2 View Result Date: 06/19/2016 CLINICAL DATA:  ICD device deployed 3 times today. No chest complaints. EXAM: CHEST  2 VIEW COMPARISON:  04/25/2016 FINDINGS: Stable cardiomegaly with uncoiled tortuous appearance of the thoracic aorta. ICD device projects over the left mid anterior the chest wall with single right defibrillator lead in the right ventricle. No overt pulmonary edema, pneumonic consolidation, effusion or pneumothorax. No acute nor suspicious osseous lesions. Partially resected appearance of the distal right clavicle. IMPRESSION: No active cardiopulmonary disease. ICD device in place with stable cardiomegaly. Electronically Signed   By: Tollie Eth M.D.   On: 06/19/2016 19:20   Reviewed by myself w/Dr. Elberta Fortis EKG: WCT, 113bpm, QRS , LBBB, appears similar to last month, ?slow VT 04/30/16 SB, 1st degree AVBlock, PR , QRS TELEMETRY: SR, occ PVCs  09/28/15: LHC Conclusion    Prox  RCA to Mid RCA lesion, 100 %stenosed.  Ost LM to LM lesion, 30 %stenosed.  Mid Cx lesion, 70 %stenosed.  Ost 3rd Mrg to 3rd Mrg lesion, 75 %stenosed.  Prox LAD lesion, 35 %stenosed.  There is severe left ventricular systolic dysfunction.  LV end diastolic pressure is severely elevated.  The left ventricular ejection fraction is less than 25% by visual estimate.  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  1. 2 vessel obstructive CAD. There is chronic total occlusion of the proximal RCA. There is moderate disease in the distal LCx and third OM. This is unchanged from 2004. There is new aneurysmal dilation of the distal left main. 2. Severe LV dysfunction  3. Marked elevation of the  LVEDP.    11/30/15: TTE Study Conclusions - Left ventricle: The cavity size was mildly dilated. Wall   thickness was normal. Systolic function was severely reduced. The   estimated ejection fraction was in the range of 25% to 30%. There   is akinesis of the inferolateral and inferior myocardium.   Features are consistent with a pseudonormal left ventricular   filling pattern, with concomitant abnormal relaxation and   increased filling pressure (grade 2 diastolic dysfunction).   Doppler parameters are consistent with high ventricular filling   pressure. - Aortic valve: There was mild regurgitation. - Mitral valve: There was severe regurgitation. - Left atrium: The atrium was moderately dilated. - Right atrium: The atrium was moderately dilated. - Tricuspid valve: There was moderate regurgitation. - Pulmonary arteries: Systolic pressure was moderately increased. Impressions: - Definity used; akinesis of the inferior and inferior lateral wall   with overall severely reduced LV systolic function; grade 2   diastolic dysfunction with elevated LV filling pressure; biatrial   enlargement; mild AI; severe MR; moderate TR with moderately   elevated pulmonary pressure.   Assessment and Plan:   1. VT storm w/appropriate shocks     Carelink reviewed     Micai Apolinar make device programming changes Dr. [name] recommended, Dr. Elberta Fortis Collyn Ribas discuss case with him, ? Consider add on Sotalol, though has significant conduction system disease with a single lead device, and historically has tried to avoid RV pacing, [place] review with pharmacy     Has failed a number of AAD     Mitchelle Sultan d/w Dr. [name] who knows him well.       2. CAD      No anginal c/o      Last cath as above < 1 year ago, not felt to be culprit for his VT      On BB, ranexa, unclear why no statin  3. PAFib      CHA2DS2Vasc is at least 4, on Eliquis (2,5mg  dose), low dose is at the patient's insistence  4. ICM     Exam and xray appear  well compensated  5. HTN     No changes, BP looks good    Signed, Francis Dowse, PA-C 06/20/2016 11:48 AM    I have seen and examined this patient with Francis Dowse.  Agree with above, note added to reflect my findings.  On exam, RRR, no murmurs, lungs clear.  Another hospital after multiple ICD shocks. He has been feeling well without major complaint prior to shocks. Immediately pre-shock, he does note shortness of breath and extreme fatigue. Device interrogation shows multiple episodes of ventricular tachycardia. The patient has received both ATP and ICD shocks that if converted him back to sinus rhythm. We'll plan for reprogramming of  his device for more aggressive ATP. We'll also plan to start him on sotalol during this admission at 120 mg.  Odena Mcquaid M. Diallo Ponder MD 06/20/2016 1:33 PM

## 2016-06-20 NOTE — Telephone Encounter (Signed)
New message   [name] is calling to make sure Dr. [name] is aware he is currently in the hospital. He went to hospital at 6:30 yesterday.

## 2016-06-20 NOTE — Addendum Note (Signed)
Addended by: Gwenlyn Saran on: 06/20/2016 03:46 PM   Modules accepted: Orders

## 2016-06-20 NOTE — ED Notes (Signed)
Dr. Orson Aloe called this RN to acknowledge that he received page regarding pts VT on cardiac monitor.

## 2016-06-20 NOTE — ED Notes (Signed)
Date and time results received: 06/20/16 0426 (use smartphrase ".now" to insert current time)  Test: Troponin Critical Value: 0.03  Name of Provider Notified: Chelsea primaryrn   Orders Received? Or Actions Taken?:

## 2016-06-20 NOTE — ED Notes (Signed)
Delay in lab draw edp at bedside. 

## 2016-06-20 NOTE — Care Management Note (Addendum)
Case Management Note  Patient Details  Name: [name] MRN: [id number] Date of Birth: August 03, 1945  Subjective/Objective:   From home, presents with ventricular tachycardia, ICD discharge, atrial tachycardia, vtach.   PCP listed is Crawford Givens.                   Action/Plan: NCM will follow for dc needs.  Expected Discharge Date:  06/21/16               Expected Discharge Plan:     In-House Referral:     Discharge planning Services  CM Consult  Post Acute Care Choice:    Choice offered to:     DME Arranged:    DME Agency:     HH Arranged:    HH Agency:     Status of Service:  In process, will continue to follow  If discussed at Long Length of Stay Meetings, dates discussed:    Additional Comments:  Leone Haven, RN 06/20/2016, 4:39 PM

## 2016-06-21 LAB — BASIC METABOLIC PANEL
ANION GAP: 9 (ref 5–15)
BUN: 16 mg/dL (ref 6–20)
CALCIUM: 8.6 mg/dL — AB (ref 8.9–10.3)
CO2: 24 mmol/L (ref 22–32)
Chloride: 107 mmol/L (ref 101–111)
Creatinine, Ser: 1.1 mg/dL (ref 0.61–1.24)
Glucose, Bld: 104 mg/dL — ABNORMAL HIGH (ref 65–99)
POTASSIUM: 3.9 mmol/L (ref 3.5–5.1)
SODIUM: 140 mmol/L (ref 135–145)

## 2016-06-21 LAB — T3, FREE: T3 FREE: 2.9 pg/mL (ref 2.0–4.4)

## 2016-06-21 LAB — MAGNESIUM: MAGNESIUM: 1.9 mg/dL (ref 1.7–2.4)

## 2016-06-21 NOTE — Progress Notes (Signed)
Electrophysiology Rounding Note  Patient Name: [name] Date of Encounter: 06/21/2016  Electrophysiologist: [name]   Subjective   The patient is doing well today.  At this time, the patient denies chest pain, shortness of breath, or any new concerns. He is sleeping but arouses  Inpatient Medications    Scheduled Meds: . apixaban  2.5 mg Oral BID  . famotidine  20 mg Oral Daily  . furosemide  20 mg Oral Q M,W,F  . guaiFENesin  600 mg Oral BID  . mouth rinse  15 mL Mouth Rinse BID  . mexiletine  300 mg Oral Q8H  . ranolazine  500 mg Oral BID  . sodium chloride flush  3 mL Intravenous Q12H  . sotalol  120 mg Oral Q12H   Continuous Infusions:  PRN Meds: acetaminophen, loperamide, metaxalone, nitroGLYCERIN, promethazine, simethicone, traMADol, zolpidem   Vital Signs    Vitals:   06/20/16 2311 06/21/16 0140 06/21/16 0323 06/21/16 0843  BP: 111/65 114/75 110/67 103/81  Pulse: 66 65 69 66  Resp: 20 17 15    Temp: 97.5 F (36.4 C) 97.8 F (36.6 C) 97.6 F (36.4 C) 97.9 F (36.6 C)  TempSrc: Oral Oral Oral Oral  SpO2: 96% 98% (!) 88% 94%  Weight:      Height:        Intake/Output Summary (Last 24 hours) at 06/21/16 1131 Last data filed at 06/21/16 0843  Gross per 24 hour  Intake              480 ml  Output              225 ml  Net              255 ml   Filed Weights   06/19/16 1832  Weight: 150 lb (68 kg)    Physical Exam    GEN- The patient is elderly and chronically ill appearing, alert and oriented x 3 today.   Head- normocephalic, atraumatic Eyes-  Sclera clear, conjunctiva pink Ears- hearing intact Oropharynx- clear Neck- supple Lungs- Clear to ausculation bilaterally, normal work of breathing Heart- Regular rate and rhythm, no murmurs, rubs or gallops GI- soft, NT, ND, + BS Extremities- no clubbing, cyanosis, or edema Skin- no rash or lesion Psych- euthymic mood, full affect Neuro- strength and sensation are intact  Labs     CBC  Recent Labs  06/19/16 1857 06/20/16 0325  WBC 8.5 10.2  NEUTROABS 5.5  --   HGB 9.6* 10.2*  HCT 32.7* 34.7*  MCV 77.9* 77.6*  PLT 340 354   Basic Metabolic Panel  Recent Labs  06/19/16 1857 06/20/16 0325  NA 139 138  K 4.0 3.9  CL 108 106  CO2 23 22  GLUCOSE 93 117*  BUN 22* 21*  CREATININE 1.18 1.16  CALCIUM 8.5* 8.6*  MG 2.0 2.0   Cardiac Enzymes  Recent Labs  06/20/16 0325  TROPONINI 0.03*   Thyroid Function Tests  Recent Labs  06/20/16 0325  TSH 0.116*    Telemetry    Sinus rhythm (personally reviewed)  Radiology    Dg Chest 2 View  Result Date: 06/19/2016 CLINICAL DATA:  ICD device deployed 3 times today. No chest complaints. EXAM: CHEST  2 VIEW COMPARISON:  04/25/2016 FINDINGS: Stable cardiomegaly with uncoiled tortuous appearance of the thoracic aorta. ICD device projects over the left mid anterior the chest wall with single right defibrillator lead in the right ventricle. No overt pulmonary edema, pneumonic consolidation,  effusion or pneumothorax. No acute nor suspicious osseous lesions. Partially resected appearance of the distal right clavicle. IMPRESSION: No active cardiopulmonary disease. ICD device in place with stable cardiomegaly. Electronically Signed   By: Tollie Eth M.D.   On: 06/19/2016 19:20    Assessment & Plan    1.  Ventricular tachycardia S/p appropriate device therapy Sotalol started yesterday BMET, Mg pending QTc stable Plan to discharge tomorrow if QTc and labs remain stable No driving x6 months  2.  CAD No recent ischemic symptoms Continue current therapy  3.  Paroxysmal atrial fibrillation CHADS2VASC is 4 Maintaining SR this admission Dose should be 5mg  twice daily, however, patient refuses higher dose  4.  HTN Stable No change required today   Signed, Gypsy Balsam, NP  06/21/2016, 11:31 AM   I have seen and examined this patient with Gypsy Balsam.  Agree with above, note added to reflect my  findings.  On exam, RRR, no murmurs, lungs clear. Tolerating sotalol without major abnormality. QTc stable from prior to sotalol administration yesterday. [name] continue at current dose.    Darryn Kydd M. Kevonta Phariss MD 06/21/2016 12:40 PM

## 2016-06-22 LAB — BASIC METABOLIC PANEL
Anion gap: 8 (ref 5–15)
BUN: 21 mg/dL — AB (ref 6–20)
CALCIUM: 8.3 mg/dL — AB (ref 8.9–10.3)
CHLORIDE: 105 mmol/L (ref 101–111)
CO2: 24 mmol/L (ref 22–32)
CREATININE: 1.17 mg/dL (ref 0.61–1.24)
GFR calc non Af Amer: >60 mL/min (ref >60)
Glucose, Bld: 99 mg/dL (ref 65–99)
Potassium: 4 mmol/L (ref 3.5–5.1)
SODIUM: 137 mmol/L (ref 135–145)

## 2016-06-22 LAB — MAGNESIUM: Magnesium: 1.8 mg/dL (ref 1.7–2.4)

## 2016-06-22 MED ORDER — SOTALOL HCL 120 MG PO TABS: 120.0000 mg | ORAL_TABLET | Freq: Two times a day (BID) | ORAL | 3 refills | Status: DC

## 2016-06-22 NOTE — Discharge Summary (Signed)
Discharge Summary    Patient ID: [name],  MRN: [id number], DOB/AGE: Oct 25, 1945 71 y.o.  Admit date: 06/19/2016 Discharge date: 06/22/2016  Primary Care Provider: Joaquim [name] Primary Cardiologist: Dr. Ladona [name]  Discharge Diagnoses    Active Problems:   VT (ventricular tachycardia) [facility])   Coronary artery disease involving native coronary artery of native heart without angina pectoris   ICD (implantable cardioverter-defibrillator) discharge   Atrial tachycardia (HCC)   Ventricular tachycardia (HCC)   Allergies Allergies  Allergen Reactions  . Ace Inhibitors Other (See Comments)    muscle pain. Tolerates ARBs.   . Codeine Other (See Comments)    "head wants to explode."  . Doxycycline Diarrhea and Nausea And Vomiting  . Kionex [Sodium Polystyrene Sulfonate] Other (See Comments)    SOB, pressure in chest, weakness fatigue.   [name] Penicillins Swelling    "started at point of injection; w/in 3 min my upper arm was swollen 3 times normal" Has patient had a PCN reaction causing immediate rash, facial/tongue/throat swelling, SOB or lightheadedness with hypotension: Yes Has patient had a PCN reaction causing severe rash involving mucus membranes or skin necrosis: No Has patient had a PCN reaction that required hospitalization No Has patient had a PCN reaction occurring within the last 10 years: No If all of the above answers are "NO", then may proceed wi  . Lisinopril Other (See Comments)    Muscle Pain  . Statins Other (See Comments)    Myalgias per patient  . Hydrocodone Other (See Comments)    Intolerant, esp when combined with skelaxin.    . Lidocaine     Hallucinations, jerking  . Pacerone [Amiodarone] Other (See Comments)    Lung and heart problem  . Xanax [Alprazolam] Other (See Comments)    Nightmares.      Diagnostic Studies/Procedures    None   History of Present Illness     [name] is a 71 y.o. male with past medical history of coronary  artery disease status post potential percutaneous intervention prior complicated with likely ischemic cardiomyopathy with severe systolic dysfunction status post ICD, recurrent VT, paroxysmal atrial fibrillation, hypertension, prior tobacco abuse, and presumed COPD came with ICD charging 06/20/15.   The patient had recevied 2 shocks over the weekend, he sent a remote transmission and had an appointment  to have some programming changes that Dr. Ladona [name] had recommended  However came with another shock.   ICD interrogation shows at total of 5 successful defibrillation attempts from his AICD since 06/16/16 (VT zone >200 bpm).   Hospital Course     Consultants: EP  Device programmed as recommended by Dr. Ladona [name]. Started on sotalol 120mg  BID. Tolerated sotalol without major abnormality. QTc remained stable. No chest pain or dyspnea. Electrolyte stable. No arrhthymias noted.   The patient has been seen by Dr.Linnie [name]  today and deemed ready for discharge home. All follow-up appointments have been scheduled. Discharge medications are listed below.   Follow-up with bmet, mg and ekg in 1 week with EP APP as scheduled  Discharge Vitals Blood pressure 120/69, pulse 67, temperature 97.3 F (36.3 C), temperature source Oral, resp. rate 16, height 5\' 3"  (1.6 m), weight 150 lb (68 kg), SpO2 98 %.  Filed Weights   06/19/16 1832  Weight: 150 lb (68 kg)   Physical exam GEN- The patient is elderly male in NAD   Head- normocephalic, atraumatic Neck- supple Lungs- Clear to ausculation bilaterally, normal work of breathing Heart-  Regular rate and rhythm, no murmurs, rubs or gallops GI- soft, NT, ND, + BS Extremities- no clubbing, cyanosis, or edema Skin- no rash or lesion Psych- euthymic mood, full affect Neuro- strength and sensation are intact  Labs & Radiologic Studies     CBC  Recent Labs  06/19/16 1857 06/20/16 0325  WBC 8.5 10.2  NEUTROABS 5.5  --   HGB 9.6* 10.2*  HCT 32.7* 34.7*  MCV  77.9* 77.6*  PLT 340 354   Basic Metabolic Panel  Recent Labs  06/21/16 1251 06/22/16 0310  NA 140 137  K 3.9 4.0  CL 107 105  CO2 24 24  GLUCOSE 104* 99  BUN 16 21*  CREATININE 1.10 1.17  CALCIUM 8.6* 8.3*  MG 1.9 1.8   Liver Function Tests No results for input(s): AST, ALT, ALKPHOS, BILITOT, PROT, ALBUMIN in the last 72 hours. No results for input(s): LIPASE, AMYLASE in the last 72 hours. Cardiac Enzymes  Recent Labs  06/20/16 0325  TROPONINI 0.03*   BNP Invalid input(s): POCBNP D-Dimer No results for input(s): DDIMER in the last 72 hours. Hemoglobin A1C No results for input(s): HGBA1C in the last 72 hours. Fasting Lipid Panel No results for input(s): CHOL, HDL, LDLCALC, TRIG, CHOLHDL, LDLDIRECT in the last 72 hours. Thyroid Function Tests  Recent Labs  06/20/16 0325  TSH 0.116*  T3FREE 2.9    Dg Chest 2 View  Result Date: 06/19/2016 CLINICAL DATA:  ICD device deployed 3 times today. No chest complaints. EXAM: CHEST  2 VIEW COMPARISON:  04/25/2016 FINDINGS: Stable cardiomegaly with uncoiled tortuous appearance of the thoracic aorta. ICD device projects over the left mid anterior the chest wall with single right defibrillator lead in the right ventricle. No overt pulmonary edema, pneumonic consolidation, effusion or pneumothorax. No acute nor suspicious osseous lesions. Partially resected appearance of the distal right clavicle. IMPRESSION: No active cardiopulmonary disease. ICD device in place with stable cardiomegaly. Electronically Signed   By: Tollie Eth M.D.   On: 06/19/2016 19:20    Disposition   Pt is being discharged home today in good condition.  Follow-up Plans & Appointments    Follow-up Information    Randy Pigeon, PA-C Follow up on 06/28/2016.   Specialty:  Cardiology Why:  at [facility] information: [address] [place] [address] [phone]          Discharge Instructions    Diet - low sodium heart healthy     Complete by:  As directed    Increase activity slowly    Complete by:  As directed       Discharge Medications   Current Discharge Medication List    START taking these medications   Details  sotalol (BETAPACE) 120 MG tablet Take 1 tablet (120 mg total) by mouth every 12 (twelve) hours. Qty: 60 tablet, Refills: 3      CONTINUE these medications which have NOT CHANGED   Details  acetaminophen (TYLENOL) 325 MG tablet Take 2 tablets (650 mg total) by mouth every 6 (six) hours as needed for mild pain (or Fever >/= 101).    apixaban (ELIQUIS) 2.5 MG TABS tablet Take 1 tablet (2.5 mg total) by mouth 2 (two) times daily. Qty: 180 tablet, Refills: 3    benzonatate (TESSALON) 200 MG capsule Take 1 capsule (200 mg total) by mouth every 4 (four) hours as needed for cough. Qty: 20 capsule, Refills: 0    Chlorpheniramine-DM (CORICIDIN HBP COUGH/COLD PO) Take  2 capsules by mouth daily as needed (cold).     furosemide (LASIX) 20 MG tablet Take 1 tablet (20 mg total) by mouth every Monday, Wednesday, and Friday. Qty: 45 tablet, Refills: 3    guaiFENesin (MUCINEX) 600 MG 12 hr tablet Take 600 mg by mouth 2 (two) times daily.     loperamide (IMODIUM) 2 MG capsule Take 1 capsule (2 mg total) by mouth as needed for diarrhea or loose stools. Qty: 30 capsule, Refills: 0    metaxalone (SKELAXIN) 800 MG tablet Take 1 tablet (800 mg total) by mouth daily as needed for muscle spasms. Qty: 30 tablet, Refills: 1    mexiletine (MEXITIL) 150 MG capsule Take 2 capsules (300 mg total) by mouth every 8 (eight) hours. Qty: 180 capsule, Refills: 3    nitroGLYCERIN (NITROSTAT) 0.4 MG SL tablet Place 1 tablet (0.4 mg total) under the tongue every 5 (five) minutes as needed for chest pain. Qty: 25 tablet, Refills: 1    promethazine (PHENERGAN) 25 MG tablet Take 25 mg by mouth every 6 (six) hours as needed for nausea or vomiting.    RANEXA 500 MG 12 hr tablet TAKE 1 TABLET (500 MG TOTAL) BY MOUTH 2 (TWO)  TIMES DAILY. Qty: 180 tablet, Refills: 3    ranitidine (ZANTAC) 150 MG capsule Take 150 mg by mouth daily as needed for heartburn.     Simethicone (GAS-X PO) Take 1 tablet by mouth as needed (for gas). As needed.     traMADol (ULTRAM) 50 MG tablet Take 1 tablet (50 mg total) by mouth every 8 (eight) hours as needed (for pain). Qty: 100 tablet, Refills: 1    zolpidem (AMBIEN) 10 MG tablet Take 0.5-1 tablets (5-10 mg total) by mouth at bedtime as needed for sleep. Qty: 30 tablet, Refills: 1    NON FORMULARY Oxygen 3 liters   24/7      STOP taking these medications     metoprolol succinate (TOPROL XL) 25 MG 24 hr tablet      budesonide-formoterol (SYMBICORT) 160-4.5 MCG/ACT inhaler      budesonide-formoterol (SYMBICORT) 160-4.5 MCG/ACT inhaler          Outstanding Labs/Studies   BMET, Mg and EKG during follow up  Duration of Discharge Encounter   Greater than 30 minutes including physician time.  Signed, Bhagat,Bhavinkumar PA-C 06/22/2016, 2:12 PM  Hillis Range, MD

## 2016-06-22 NOTE — Telephone Encounter (Signed)
Thank you. GT 

## 2016-06-22 NOTE — Progress Notes (Signed)
Doing well today, without any rucurrent arrhythmias Tolerating sotalol and wants to go home.  Dc to home on current dose of sotalol. ekg prior to discharge today   Follow-up with bmet, mg and ekg in 1 week with EP APP as scheduled followup with Dr [name] in July as scheduled  No driving x 5months (pt aware)  Hillis Range MD, [facility] 06/22/2016 11:37 AM

## 2016-06-28 ENCOUNTER — Ambulatory Visit (INDEPENDENT_AMBULATORY_CARE_PROVIDER_SITE_OTHER): Payer: Medicare Other | Admitting: Physician Assistant

## 2016-06-28 ENCOUNTER — Telehealth: Payer: Self-pay | Admitting: Pulmonary Disease

## 2016-06-28 VITALS — BP 100/64 | HR 54 | Ht 63.0 in | Wt 153.0 lb

## 2016-06-28 DIAGNOSIS — Z79899 Other long term (current) drug therapy: Secondary | ICD-10-CM | POA: Diagnosis not present

## 2016-06-28 DIAGNOSIS — I471 Supraventricular tachycardia: Secondary | ICD-10-CM

## 2016-06-28 DIAGNOSIS — I2510 Atherosclerotic heart disease of native coronary artery without angina pectoris: Secondary | ICD-10-CM | POA: Diagnosis not present

## 2016-06-28 DIAGNOSIS — I5043 Acute on chronic combined systolic (congestive) and diastolic (congestive) heart failure: Secondary | ICD-10-CM | POA: Diagnosis not present

## 2016-06-28 DIAGNOSIS — I480 Paroxysmal atrial fibrillation: Secondary | ICD-10-CM | POA: Diagnosis not present

## 2016-06-28 DIAGNOSIS — I255 Ischemic cardiomyopathy: Secondary | ICD-10-CM

## 2016-06-28 DIAGNOSIS — I472 Ventricular tachycardia, unspecified: Secondary | ICD-10-CM

## 2016-06-28 LAB — BASIC METABOLIC PANEL
BUN/Creatinine Ratio: 25 — ABNORMAL HIGH (ref 10–24)
BUN: 28 mg/dL — AB (ref 8–27)
CALCIUM: 9.1 mg/dL (ref 8.6–10.2)
CO2: 21 mmol/L (ref 18–29)
CREATININE: 1.13 mg/dL (ref 0.76–1.27)
Chloride: 104 mmol/L (ref 96–106)
GFR calc Af Amer: 75 mL/min/{1.73_m2} (ref >59)
GFR calc non Af Amer: 65 mL/min/{1.73_m2} (ref >59)
GLUCOSE: 100 mg/dL — AB (ref 65–99)
Potassium: 5.5 mmol/L — ABNORMAL HIGH (ref 3.5–5.2)
Sodium: 142 mmol/L (ref 134–144)

## 2016-06-28 LAB — MAGNESIUM: MAGNESIUM: 2.3 mg/dL (ref 1.6–2.3)

## 2016-06-28 NOTE — Progress Notes (Signed)
Cardiology Office Note Date:  06/28/2016  Patient ID:  [name], [name] 1946-01-02, MRN [id number] PCP:  Joaquim Nam, MD  Cardiologist:  Dr. [name]   Chief Complaint:  post hospital f/u  History of Present Illness: [name] is a 71 y.o. male with history of CAD, ICM, VT w/ICD, persisitent A Fib, ICM s/p AICD, COPD, anxiety disorder, GIB, multiple hospitalizations for HCAP in the past few months --most recent for HCAP requiring discharge to home on oxygen, amio toxicity.  Most recently his was hospitalized with ICD shocks, discharged 06/20/16, device noted multiple VTs again with appropriate shocks.  His device was reprogrammed and sotalol was added to his regime.  He denies any kind of CP, palpitations, no dizziness, near syncope or syncope.  He is still on O2,  He has noticed that he feels more SOB since the hospital, before able tolerate time off the O2 to eat, do minimal activities, but since has not been able to without feeling SOB, otherwise he feels like hs is doing OK.  He has not had any bleeding or signs of bleeding.   Device information/history: MDT single chamber ICD, implanted 08/26/02, Dr. [name], VT 11/29/15 started on mexiletine/Ranexa 11/28/15: VT which degenerated into VF with ATP treatment and he received ICD shock 09/27/15: VT episode that he received 2 ATP therapies for and 1 shock, a number of NSVT episodes, and another VT episode he received ATP for that slowed the tachycardia.  Morphology appears that he has 2 different VT's VT storm July 2017 VT storm in April 2017 VT w/shocks May 2018 AAD tx:  amiodarone loaded/started July 2017 discontinued 2/2 suspect lung tox >> Mexiletine and Ranexa Hallucinations with lidocaine Feb Quinidine Gluconate stopped ? GI intolerance May 2017 Sotalol added   Past Medical History:  Diagnosis Date  . Acute lower GI bleeding 12/11/2011   "first time" (12/11/2011)  . Acute myocardial infarction, unspecified site,  episode of care unspecified 1995   Pt living in [place], no stent, ?PTCA  . AICD (automatic cardioverter/defibrillator) present   . Allergic rhinitis, cause unspecified   . Anxiety   . Arthritis    "all over" (11/07/2015)  . Atrial fibrillation (HCC)    a. 05/2015 - converted to sinus in setting of ICD shocks; placed on eliquis 5 bid.  [name] CAD (coronary artery disease), autologous vein bypass graft   . Cervical herniated disc    told not to lift >10 lbs  . Chronic systolic CHF (congestive heart failure), NYHA class 2 (HCC)    Reports EF of 25%.   [name] COPD (chronic obstructive pulmonary disease) (HCC) 11/2012   by xray  . Diverticulosis    by CT scan  . HCAP (healthcare-associated pneumonia) 11/06/2015  . Hypertension   . Insomnia   . Paroxysmal ventricular tachycardia (HCC)   . Perennial allergic rhinitis    only to dust mites  . Pneumonia 2000s   "walking pneumonia"  . VT (ventricular tachycardia) (HCC)    a. 05/2015 - VT storm with multiple ICD shocks-->Amio 400 BID.    Past Surgical History:  Procedure Laterality Date  . CARDIAC CATHETERIZATION  2004   LAD 30%, D1 30%, CFX-AV groove 70-80%, OM1 30%, EF 20-25%  . CARDIAC CATHETERIZATION N/A 09/28/2015   Procedure: Left Heart Cath and Coronary Angiography;  Surgeon: Peter M Swaziland, MD;  Location: [facility] INVASIVE CV LAB;  Service: Cardiovascular;  Laterality: N/A;  . CARDIAC DEFIBRILLATOR PLACEMENT  2004  . CATARACT EXTRACTION W/  INTRAOCULAR LENS IMPLANT Right 01/2012  . COLONOSCOPY  01/08/2012   Procedure: COLONOSCOPY;  Surgeon: Iva Boop, MD;  Location: [facility] ENDOSCOPY;  Service: Endoscopy;  Laterality: N/A;  . CORONARY ANGIOPLASTY  1995   Pt thinks he got a balloon, living in [place], [place]  . IMPLANTABLE CARDIOVERTER DEFIBRILLATOR GENERATOR CHANGE N/A 02/07/2012   Procedure: IMPLANTABLE CARDIOVERTER DEFIBRILLATOR GENERATOR CHANGE;  Surgeon: Marinus Maw, MD; Medtronic Evera XT VR single-chamber serial number [id number], Laterality:  Left  . INSERT / REPLACE / REMOVE PACEMAKER  2004   Medtronic ICD  . KNEE ARTHROSCOPY Left 05/2003   Hattie Perch 06/19/2010  . LAPAROSCOPIC CHOLECYSTECTOMY  1/ 2012  . SHOULDER ARTHROSCOPY W/ ROTATOR CUFF REPAIR Right twice  . TONSILLECTOMY AND ADENOIDECTOMY  ~ 1951  . V TACH ABLATION N/A 04/29/2016   Procedure: V Tach Ablation;  Surgeon: Marinus Maw, MD;  Location: [facility] INVASIVE CV LAB;  Service: Cardiovascular;  Laterality: N/A;    Current Outpatient Prescriptions  Medication Sig Dispense Refill  . acetaminophen (TYLENOL) 325 MG tablet Take 2 tablets (650 mg total) by mouth every 6 (six) hours as needed for mild pain (or Fever >/= 101).    [name] apixaban (ELIQUIS) 2.5 MG TABS tablet Take 1 tablet (2.5 mg total) by mouth 2 (two) times daily. 180 tablet 3  . benzonatate (TESSALON) 200 MG capsule Take 1 capsule (200 mg total) by mouth every 4 (four) hours as needed for cough. 20 capsule 0  . Chlorpheniramine-DM (CORICIDIN HBP COUGH/COLD PO) Take 2 capsules by mouth daily as needed (cold).     . furosemide (LASIX) 20 MG tablet Take 1 tablet (20 mg total) by mouth every Monday, Wednesday, and Friday. 45 tablet 3  . guaiFENesin (MUCINEX) 600 MG 12 hr tablet Take 600 mg by mouth 2 (two) times daily.     [name] loperamide (IMODIUM) 2 MG capsule Take 1 capsule (2 mg total) by mouth as needed for diarrhea or loose stools. (Patient taking differently: Take 4 mg by mouth See admin instructions. Take 2 tablets after each bowl movement.) 30 capsule 0  . metaxalone (SKELAXIN) 800 MG tablet Take 1 tablet (800 mg total) by mouth daily as needed for muscle spasms. (Patient taking differently: Take 400 mg by mouth daily as needed for muscle spasms. ) 30 tablet 1  . mexiletine (MEXITIL) 150 MG capsule Take 2 capsules (300 mg total) by mouth every 8 (eight) hours. 180 capsule 3  . nitroGLYCERIN (NITROSTAT) 0.4 MG SL tablet Place 1 tablet (0.4 mg total) under the tongue every 5 (five) minutes as needed for chest pain. 25 tablet  1  . NON FORMULARY Oxygen 3 liters   24/7    . promethazine (PHENERGAN) 25 MG tablet Take 25 mg by mouth every 6 (six) hours as needed for nausea or vomiting.    [name] RANEXA 500 MG 12 hr tablet TAKE 1 TABLET (500 MG TOTAL) BY MOUTH 2 (TWO) TIMES DAILY. 180 tablet 3  . ranitidine (ZANTAC) 150 MG capsule Take 150 mg by mouth daily as needed for heartburn.     . Simethicone (GAS-X PO) Take 1 tablet by mouth as needed (for gas). As needed.     . sotalol (BETAPACE) 120 MG tablet Take 1 tablet (120 mg total) by mouth every 12 (twelve) hours. 60 tablet 3  . traMADol (ULTRAM) 50 MG tablet Take 1 tablet (50 mg total) by mouth every 8 (eight) hours as needed (for pain). 100 tablet 1  . zolpidem (AMBIEN)  10 MG tablet Take 0.5-1 tablets (5-10 mg total) by mouth at bedtime as needed for sleep. 30 tablet 1   No current facility-administered medications for this visit.    Facility-Administered Medications Ordered in Other Visits  Medication Dose Route Frequency Provider Last Rate Last Dose  . sodium chloride 0.9 % injection 3 mL  3 mL Intravenous Q12H Marinus Maw, MD      . sodium chloride 0.9 % injection 3 mL  3 mL Intravenous PRN Marinus Maw, MD        Allergies:   Ace inhibitors; Codeine; Doxycycline; Kionex [sodium polystyrene sulfonate]; Penicillins; Lisinopril; Statins; Hydrocodone; Lidocaine; Pacerone [amiodarone]; and Xanax [alprazolam]   Social History:  The patient  reports that he quit smoking about 9 months ago. His smoking use included Cigarettes and Cigars. He has a 25.00 pack-year smoking history. He has never used smokeless tobacco. He reports that he does not drink alcohol or use drugs.   Family History:  The patient's family history includes Cancer in his sister; Diabetes in his father; Stroke in his mother; Tracheal cancer (age of onset: 22) in his father.  ROS:  Please see the history of present illness.  All other systems are reviewed and otherwise negative.   PHYSICAL EXAM:    VS:  BP 100/64   Pulse (!) 54   Ht 5\' 3"  (1.6 m)   Wt 153 lb (69.4 kg)   BMI 27.10 kg/m  BMI: Body mass index is 27.1 kg/m.  97% O2 sat on 3L Well nourished, well developed, chronically ill appearing in no acute distress  HEENT: normocephalic, atraumatic  Neck: no JVD, carotid bruits or masses Cardiac: RRR, bradycardic, 1/6 SM, no rubs, or gallops Lungs:  CTA b/l, no wheezing is appreciated, rhonchi or rales  Abd: soft, nontender MS: no deformity or atrophy Ext: no edema  Skin: warm and dry, no rash Neuro:  No gross deficits appreciated Psych: euthymic mood, full affect  ICD site is stable, no tethering or discomfort   EKG:  Done today and reviewed by myself is SB, 53bpm, 1st degree AVblock, PR , RBBB QRS , QTC stable given BBB, baseline artifact 06/19/16: WCT, 113bpm, QRS , LBBB, appears similar to last month, ?slow VT 04/30/16 SB, 1st degree AVBlock, PR , QRS Done 12/15/15 SR, 1st degree AVblock, RBBB ICD interrogation done today and reviewed by myself: battery and lead status is stable, no VT since his admit/hospital stay  11/30/15: TTE Study Conclusions - Left ventricle: The cavity size was mildly dilated. Wall   thickness was normal. Systolic function was severely reduced. The   estimated ejection fraction was in the range of 25% to 30%. There   is akinesis of the inferolateral and inferior myocardium.   Features are consistent with a pseudonormal left ventricular   filling pattern, with concomitant abnormal relaxation and   increased filling pressure (grade 2 diastolic dysfunction).   Doppler parameters are consistent with high ventricular filling   pressure. - Aortic valve: There was mild regurgitation. - Mitral valve: There was severe regurgitation. - Left atrium: The atrium was moderately dilated. - Right atrium: The atrium was moderately dilated. - Tricuspid valve: There was moderate regurgitation. - Pulmonary arteries: Systolic  pressure was moderately increased. Impressions: - Definity used; akinesis of the inferior and inferior lateral wall   with overall severely reduced LV systolic function; grade 2   diastolic dysfunction with elevated LV filling pressure; biatrial   enlargement; mild AI; severe MR; moderate TR  with moderately   elevated pulmonary pressure.  09/28/15: LHC Conclusion  Prox RCA to Mid RCA lesion, 100 %stenosed.  Ost LM to LM lesion, 30 %stenosed.  Mid Cx lesion, 70 %stenosed.  Ost 3rd Mrg to 3rd Mrg lesion, 75 %stenosed.  Prox LAD lesion, 35 %stenosed.  There is severe left ventricular systolic dysfunction.  LV end diastolic pressure is severely elevated.  The left ventricular ejection fraction is less than 25% by visual estimate.  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 1. 2 vessel obstructive CAD. There is chronic total occlusion of the proximal RCA. There is moderate disease in the distal LCx and third OM. This is unchanged from 2004. There is new aneurysmal dilation of the distal left main. 2. Severe LV dysfunction  3. Marked elevation of the LVEDP. Plan: continue medical therapy. He may benefit from additional diuresis.      Recent Labs: 03/10/2016: ALT 13 06/19/2016: B Natriuretic Peptide 322.2 06/20/2016: Hemoglobin 10.2; Platelets 354; TSH 0.116 06/22/2016: BUN 21; Creatinine, Ser 1.17; Magnesium 1.8; Potassium 4.0; Sodium 137  No results found for requested labs within last 8760 hours.   Estimated Creatinine Clearance: 50 mL/min (by C-G formula based on SCr of 1.17 mg/dL).   Wt Readings from Last 3 Encounters:  06/28/16 153 lb (69.4 kg)  06/19/16 150 lb (68 kg)  05/28/16 151 lb (68.5 kg)     Other studies reviewed: Additional studies/records reviewed today include: summarized above  ASSESSMENT AND PLAN:  1. VT storm, recurrent w/ICD shocks     None further since his hospital stay     Sotalol, Mexiletine and Ranexa     He no longer drives  2. PAFib     Continue Eliquis for CHADS2VASC of 3 (patient insists on low dose, declines 5mg )    Denies any bleeding or signs of bleeding, no melena    He has had prior GI bleeding, last CBC last week stable  3. Chronic CHF (systolic)     c/w CHF team     Will need to be careful with BB as he has underlying conduction system disease and has required V pacing in the past with amiodarone. He does not have atrial lead.  His exam is not clearly/overtly appear overloaded, weight is up 3 lbs.  I discussed with dr. [name], will give him 4 days in a row of lasix, today through Monday with symptoms of increased SOB       4. Amio toxicity     On O2 and steroids     sees pulmonary next week  5. CAD     No anginal c/o      Last cath as above < 1 year ago, not felt to be culprit for his VT      On BB, ranexa, unclear why no statin    Disposition: BMET and mag today, repeat BMET in 2 weeks, will see him back in 1 month given recent VT, new SOtalol and conduction disease.  Current medicines are reviewed at length with the patient today.  The patient did not have any concerns regarding medicines.  Judith Blonder, PA-C 06/28/2016 9:59 AM     [facility] [address] [place] [address] [phone] (office)  [phone] (fax)

## 2016-06-28 NOTE — Telephone Encounter (Signed)
Spoke with pt, who states he is currently taken 600mg  mucinex daily. Pt is wanting to know if he take 1,200mg  mucinex daily, as 600mg  is not working.  Pt reports of non prod cough, chest tightness & increased sob  MW please advise.  05/20/16 Instructions   Recheck CCP and rheumatoid factor Schedule PFTs and high res CT of chest Start symbicort.160/4.5  Return in 3 months

## 2016-06-28 NOTE — Patient Instructions (Addendum)
Medication Instructions:   TAKE  LASIX   MON. WED. FRI. SAT AND SUN THIS WEEK  ONLY   THEN RESUME BACK TO NORMAL DOSE MON WED AND FRI.  If you need a refill on your cardiac medications before your next appointment, please call your pharmacy.  Labwork: BMET MAG TODAY   RETURN BMET IN ONE TO TWO WEEKS FOR LASIX  MEDICATION CHANGES   Testing/Procedures: NONE ORDERED  TODAY'   Follow-Up: WITH TAYLOR IN ONE MONTH OR URSUY ON A DAY TAYLOR IN OFFICE    Any Other Special Instructions Will Be Listed Below (If Applicable).

## 2016-06-29 ENCOUNTER — Other Ambulatory Visit: Payer: Self-pay | Admitting: Family Medicine

## 2016-06-30 NOTE — Telephone Encounter (Signed)
Extra mucinex will not help. He sounds like he is fluid overloaded. GT

## 2016-07-02 NOTE — Telephone Encounter (Signed)
I left message on his phone re approp dosing for mucinex for cough / congestion but note Dr [name] is also concerned about fluid issues so definitely needs to f/u with cards but if not improving with cards rx rec return here with all meds in hand including otcs like mucinex

## 2016-07-02 NOTE — Telephone Encounter (Signed)
Lm for pt- pt needs to follow up with cardiology.

## 2016-07-02 NOTE — Addendum Note (Signed)
Addended by: Oleta Mouse on: 07/02/2016 01:14 PM   Modules accepted: Orders

## 2016-07-02 NOTE — Telephone Encounter (Signed)
MW please advise on this message from Friday.  thanks

## 2016-07-03 ENCOUNTER — Telehealth: Payer: Self-pay

## 2016-07-03 ENCOUNTER — Encounter: Payer: Self-pay | Admitting: Physician Assistant

## 2016-07-03 NOTE — Telephone Encounter (Signed)
Spoke with the pt and advised needs to call and schedule ov with cards  He verbalized understanding  Nothing further needed

## 2016-07-03 NOTE — Telephone Encounter (Signed)
Patient called to discuss shock he received this morning. He denied symptoms, states he was making breakfast and then sat down to eat. He is taking all his medication as prescribed with reports of diarrhea since initiation of Sotalol. Labs drawn on 5/25: K5.5 and Mag 2.3. Will review with GT .

## 2016-07-03 NOTE — Telephone Encounter (Signed)
Spoke with patient and informed him that Dr. [name] wants him to go to the ER if he receives any more shocks within 48hrs. Patient is scheduled for OV 6/11 to see GT. Patient verbalized understanding

## 2016-07-03 NOTE — Telephone Encounter (Signed)
Pt has f/u appt scheduled for 07/15/16, per request from Dr. [name]. Dr. [name] stated okay to cancel appt on 07/26/16 with Salli Quarry.

## 2016-07-04 ENCOUNTER — Other Ambulatory Visit: Payer: Self-pay | Admitting: Internal Medicine

## 2016-07-07 ENCOUNTER — Inpatient Hospital Stay (HOSPITAL_COMMUNITY)
Admission: EM | Admit: 2016-07-07 | Discharge: 2016-07-11 | DRG: 309 | Disposition: A | Discharge status: Home / Self Care | Payer: Medicare Other | Attending: Internal Medicine | Admitting: Internal Medicine

## 2016-07-07 ENCOUNTER — Encounter (HOSPITAL_COMMUNITY): Payer: Self-pay | Admitting: Emergency Medicine

## 2016-07-07 ENCOUNTER — Encounter (HOSPITAL_COMMUNITY)
Admission: EM | Disposition: A | Discharge status: Home / Self Care | Payer: Self-pay | Source: Home / Self Care | Attending: Internal Medicine

## 2016-07-07 ENCOUNTER — Emergency Department (HOSPITAL_COMMUNITY): Payer: Medicare Other

## 2016-07-07 DIAGNOSIS — T849XXA Unspecified complication of internal orthopedic prosthetic device, implant and graft, initial encounter: Secondary | ICD-10-CM | POA: Diagnosis not present

## 2016-07-07 DIAGNOSIS — I255 Ischemic cardiomyopathy: Secondary | ICD-10-CM | POA: Diagnosis not present

## 2016-07-07 DIAGNOSIS — I110 Hypertensive heart disease with heart failure: Secondary | ICD-10-CM | POA: Diagnosis present

## 2016-07-07 DIAGNOSIS — I5022 Chronic systolic (congestive) heart failure: Secondary | ICD-10-CM | POA: Diagnosis not present

## 2016-07-07 DIAGNOSIS — I214 Non-ST elevation (NSTEMI) myocardial infarction: Secondary | ICD-10-CM | POA: Diagnosis not present

## 2016-07-07 DIAGNOSIS — I481 Persistent atrial fibrillation: Secondary | ICD-10-CM | POA: Diagnosis present

## 2016-07-07 DIAGNOSIS — I472 Ventricular tachycardia: Secondary | ICD-10-CM | POA: Diagnosis not present

## 2016-07-07 DIAGNOSIS — I420 Dilated cardiomyopathy: Secondary | ICD-10-CM | POA: Diagnosis not present

## 2016-07-07 DIAGNOSIS — J449 Chronic obstructive pulmonary disease, unspecified: Secondary | ICD-10-CM | POA: Diagnosis present

## 2016-07-07 DIAGNOSIS — Z880 Allergy status to penicillin: Secondary | ICD-10-CM | POA: Diagnosis not present

## 2016-07-07 DIAGNOSIS — Z888 Allergy status to other drugs, medicaments and biological substances status: Secondary | ICD-10-CM | POA: Diagnosis not present

## 2016-07-07 DIAGNOSIS — Z87891 Personal history of nicotine dependence: Secondary | ICD-10-CM | POA: Diagnosis not present

## 2016-07-07 DIAGNOSIS — I252 Old myocardial infarction: Secondary | ICD-10-CM

## 2016-07-07 DIAGNOSIS — F419 Anxiety disorder, unspecified: Secondary | ICD-10-CM | POA: Diagnosis present

## 2016-07-07 DIAGNOSIS — I10 Essential (primary) hypertension: Secondary | ICD-10-CM | POA: Diagnosis not present

## 2016-07-07 DIAGNOSIS — T82599A Other mechanical complication of unspecified cardiac and vascular devices and implants, initial encounter: Secondary | ICD-10-CM | POA: Diagnosis not present

## 2016-07-07 DIAGNOSIS — Z79899 Other long term (current) drug therapy: Secondary | ICD-10-CM | POA: Diagnosis not present

## 2016-07-07 DIAGNOSIS — Z95810 Presence of automatic (implantable) cardiac defibrillator: Secondary | ICD-10-CM

## 2016-07-07 DIAGNOSIS — I2510 Atherosclerotic heart disease of native coronary artery without angina pectoris: Secondary | ICD-10-CM | POA: Diagnosis present

## 2016-07-07 DIAGNOSIS — Z4502 Encounter for adjustment and management of automatic implantable cardiac defibrillator: Secondary | ICD-10-CM

## 2016-07-07 DIAGNOSIS — R0602 Shortness of breath: Secondary | ICD-10-CM | POA: Diagnosis not present

## 2016-07-07 DIAGNOSIS — R079 Chest pain, unspecified: Secondary | ICD-10-CM | POA: Diagnosis not present

## 2016-07-07 LAB — HEPARIN LEVEL (UNFRACTIONATED): HEPARIN UNFRACTIONATED: 2.2 [IU]/mL — AB (ref 0.30–0.70)

## 2016-07-07 LAB — COMPREHENSIVE METABOLIC PANEL
ALK PHOS: 73 U/L (ref 38–126)
ALT: 9 U/L — AB (ref 17–63)
AST: 17 U/L (ref 15–41)
Albumin: 3.3 g/dL — ABNORMAL LOW (ref 3.5–5.0)
Anion gap: 7 (ref 5–15)
BUN: 20 mg/dL (ref 6–20)
CO2: 22 mmol/L (ref 22–32)
CREATININE: 1 mg/dL (ref 0.61–1.24)
Calcium: 8.7 mg/dL — ABNORMAL LOW (ref 8.9–10.3)
Chloride: 110 mmol/L (ref 101–111)
GFR calc non Af Amer: >60 mL/min (ref >60)
GLUCOSE: 89 mg/dL (ref 65–99)
Potassium: 4.4 mmol/L (ref 3.5–5.1)
SODIUM: 139 mmol/L (ref 135–145)
Total Bilirubin: 0.3 mg/dL (ref 0.3–1.2)
Total Protein: 6.3 g/dL — ABNORMAL LOW (ref 6.5–8.1)

## 2016-07-07 LAB — CBC WITH DIFFERENTIAL/PLATELET
Basophils Absolute: 0.1 10*3/uL (ref 0.0–0.1)
Basophils Relative: 1 %
EOS ABS: 0.4 10*3/uL (ref 0.0–0.7)
Eosinophils Relative: 4 %
HCT: 33.8 % — ABNORMAL LOW (ref 39.0–52.0)
HEMOGLOBIN: 10.4 g/dL — AB (ref 13.0–17.0)
LYMPHS ABS: 2.7 10*3/uL (ref 0.7–4.0)
LYMPHS PCT: 27 %
MCH: 24.2 pg — AB (ref 26.0–34.0)
MCHC: 30.8 g/dL (ref 30.0–36.0)
MCV: 78.6 fL (ref 78.0–100.0)
Monocytes Absolute: 0.6 10*3/uL (ref 0.1–1.0)
Monocytes Relative: 6 %
NEUTROS PCT: 62 %
Neutro Abs: 6.3 10*3/uL (ref 1.7–7.7)
Platelets: 348 10*3/uL (ref 150–400)
RBC: 4.3 MIL/uL (ref 4.22–5.81)
RDW: 17.6 % — ABNORMAL HIGH (ref 11.5–15.5)
WBC: 10.1 10*3/uL (ref 4.0–10.5)

## 2016-07-07 LAB — PROTIME-INR
INR: 1.22
Prothrombin Time: 15.5 seconds — ABNORMAL HIGH (ref 11.4–15.2)

## 2016-07-07 LAB — MRSA PCR SCREENING: MRSA by PCR: NEGATIVE

## 2016-07-07 LAB — APTT: APTT: 65 s — AB (ref 24–36)

## 2016-07-07 LAB — I-STAT TROPONIN, ED: Troponin i, poc: 0 ng/mL (ref 0.00–0.08)

## 2016-07-07 LAB — TROPONIN I
Troponin I: 0.03 ng/mL (ref <0.03)
Troponin I: 0.03 ng/mL (ref <0.03)
Troponin I: 0.03 ng/mL (ref <0.03)

## 2016-07-07 LAB — MAGNESIUM: Magnesium: 2 mg/dL (ref 1.7–2.4)

## 2016-07-07 SURGERY — LEFT HEART CATH AND CORONARY ANGIOGRAPHY
Anesthesia: LOCAL

## 2016-07-07 MED ORDER — METAXALONE 800 MG PO TABS
400.0000 mg | ORAL_TABLET | Freq: Every day | ORAL | Status: DC | PRN
  Filled 2016-07-07 (×2): qty 0.5

## 2016-07-07 MED ORDER — SODIUM CHLORIDE 0.9 % IV BOLUS (SEPSIS)
500.0000 mL | Freq: Once | INTRAVENOUS | Status: AC
  Administered 2016-07-07: 500 mL via INTRAVENOUS

## 2016-07-07 MED ORDER — SIMETHICONE 80 MG PO CHEW: 80.0000 mg | CHEWABLE_TABLET | ORAL | Status: DC | PRN

## 2016-07-07 MED ORDER — LOPERAMIDE HCL 2 MG PO CAPS
2.0000 mg | ORAL_CAPSULE | ORAL | Status: DC | PRN
  Administered 2016-07-08 – 2016-07-09 (×2): 2 mg via ORAL
  Filled 2016-07-07 (×2): qty 1

## 2016-07-07 MED ORDER — TRAMADOL HCL 50 MG PO TABS
50.0000 mg | ORAL_TABLET | Freq: Three times a day (TID) | ORAL | Status: DC | PRN
  Administered 2016-07-10: 50 mg via ORAL
  Filled 2016-07-07: qty 1

## 2016-07-07 MED ORDER — FAMOTIDINE 20 MG PO TABS
20.0000 mg | ORAL_TABLET | Freq: Every day | ORAL | Status: DC
  Administered 2016-07-07 – 2016-07-11 (×5): 20 mg via ORAL
  Filled 2016-07-07 (×5): qty 1

## 2016-07-07 MED ORDER — ASPIRIN EC 81 MG PO TBEC
81.0000 mg | DELAYED_RELEASE_TABLET | Freq: Every day | ORAL | Status: DC
  Administered 2016-07-08 – 2016-07-11 (×4): 81 mg via ORAL
  Filled 2016-07-07 (×4): qty 1

## 2016-07-07 MED ORDER — NITROGLYCERIN 0.4 MG SL SUBL
0.4000 mg | SUBLINGUAL_TABLET | SUBLINGUAL | Status: DC | PRN
  Administered 2016-07-07: 0.4 mg via SUBLINGUAL
  Filled 2016-07-07: qty 1

## 2016-07-07 MED ORDER — SOTALOL HCL 120 MG PO TABS
120.0000 mg | ORAL_TABLET | Freq: Two times a day (BID) | ORAL | Status: DC
  Administered 2016-07-07: 120 mg via ORAL
  Filled 2016-07-07 (×3): qty 1

## 2016-07-07 MED ORDER — HEPARIN (PORCINE) IN NACL 100-0.45 UNIT/ML-% IJ SOLN
1000.0000 [IU]/h | INTRAMUSCULAR | Status: DC
  Administered 2016-07-07: 1000 [IU]/h via INTRAVENOUS
  Filled 2016-07-07: qty 250

## 2016-07-07 MED ORDER — RANOLAZINE ER 500 MG PO TB12
500.0000 mg | ORAL_TABLET | Freq: Two times a day (BID) | ORAL | Status: DC
  Administered 2016-07-07 – 2016-07-11 (×8): 500 mg via ORAL
  Filled 2016-07-07 (×8): qty 1

## 2016-07-07 MED ORDER — SODIUM CHLORIDE 0.9% FLUSH
3.0000 mL | Freq: Two times a day (BID) | INTRAVENOUS | Status: DC
  Administered 2016-07-09 – 2016-07-11 (×4): 3 mL via INTRAVENOUS

## 2016-07-07 MED ORDER — ZOLPIDEM TARTRATE 5 MG PO TABS
5.0000 mg | ORAL_TABLET | Freq: Every evening | ORAL | Status: DC | PRN
  Administered 2016-07-07 – 2016-07-10 (×4): 5 mg via ORAL
  Filled 2016-07-07 (×4): qty 1

## 2016-07-07 MED ORDER — ACETAMINOPHEN 325 MG PO TABS
650.0000 mg | ORAL_TABLET | ORAL | Status: DC | PRN
Start: 2016-07-07 — End: 2016-07-11
  Administered 2016-07-08: 650 mg via ORAL
  Filled 2016-07-07: qty 2

## 2016-07-07 MED ORDER — SODIUM CHLORIDE 0.9 % IV SOLN
INTRAVENOUS | Status: DC
  Administered 2016-07-08: 05:00:00 via INTRAVENOUS

## 2016-07-07 MED ORDER — FUROSEMIDE 20 MG PO TABS
20.0000 mg | ORAL_TABLET | ORAL | Status: DC
  Administered 2016-07-08 – 2016-07-10 (×2): 20 mg via ORAL
  Filled 2016-07-07 (×3): qty 1

## 2016-07-07 MED ORDER — BENZONATATE 100 MG PO CAPS: 200.0000 mg | ORAL_CAPSULE | ORAL | Status: DC | PRN

## 2016-07-07 MED ORDER — MEXILETINE HCL 150 MG PO CAPS
300.0000 mg | ORAL_CAPSULE | Freq: Three times a day (TID) | ORAL | Status: DC
  Administered 2016-07-07 – 2016-07-11 (×12): 300 mg via ORAL
  Filled 2016-07-07 (×13): qty 2

## 2016-07-07 MED ORDER — ONDANSETRON HCL 4 MG/2ML IJ SOLN: 4.0000 mg | Freq: Four times a day (QID) | INTRAMUSCULAR | Status: DC | PRN

## 2016-07-07 MED ORDER — SODIUM CHLORIDE 0.9 % IV SOLN: 250.0000 mL | INTRAVENOUS | Status: DC | PRN

## 2016-07-07 MED ORDER — SODIUM CHLORIDE 0.9% FLUSH: 3.0000 mL | INTRAVENOUS | Status: DC | PRN

## 2016-07-07 NOTE — ED Notes (Signed)
Zoll pads applied to patient  

## 2016-07-07 NOTE — ED Notes (Signed)
Aspirin taken at home 325mg  prior to arrival per patient and wife at bedside.

## 2016-07-07 NOTE — ED Notes (Signed)
Gave medtronic papers to Dr. Estell Harpin.

## 2016-07-07 NOTE — ED Notes (Signed)
EKG given to EDP.  

## 2016-07-07 NOTE — Progress Notes (Signed)
Discussed with Dr Anne Fu who spoke with Dr [name]. Will plan to cath only if Troponin significantly  Elevated,(>5),  otherwise medical Rx. Cath orders were written but will cancel depending on subsequent Troponin's overnight.  Corine Shelter PA-C 07/07/2016 3:08 PM

## 2016-07-07 NOTE — ED Notes (Signed)
Cardiology at bedside.

## 2016-07-07 NOTE — ED Notes (Signed)
Cardiologist returned stated will not go to cath lab because patient took Eliquis today at 0800.

## 2016-07-07 NOTE — ED Notes (Signed)
Nurse attempted to start second IV and draw blood patient refused IV in right arm explained how critical second IV and blood draw is still refused. Wants IV placed in left arm. Charge nurse integrating Medtronic device.

## 2016-07-07 NOTE — Progress Notes (Signed)
ANTICOAGULATION CONSULT NOTE - Initial Consult  Pharmacy Consult for Heparin Indication: chest pain/ACS  Assessment: 71yo male with a complicated past medical history of CAD.  He has episodes of recurrent VT, AF and has an ICD in place.  He is on Apixaban (Eliquis) 2.5mg  twice daily and confirms with me that he took his last dose at 08:00AM today 6/3.  Dose reduced in March due to skin bleeding and patient requests.   Goal of Therapy:  Heparin level 0.3-0.7 units/ml Monitor platelets by anticoagulation protocol: Yes   Plan:  Obtain baseline heparin level and aPTT. Begin IV heparin 12 hours after dose of Apixaban without a bolus at 1000 units/hr Obtain a heparin level 8 hours after starting therapy Daily Heparin level/aPTT and CBC Monitor for bleeding complications  Allergies  Allergen Reactions  . Ace Inhibitors Other (See Comments)    muscle pain. Tolerates ARBs.   . Codeine Other (See Comments)    "head wants to explode."  . Doxycycline Diarrhea and Nausea And Vomiting  . Kionex [Sodium Polystyrene Sulfonate] Other (See Comments)    SOB, pressure in chest, weakness fatigue.   [name] Penicillins Swelling    "started at point of injection; w/in 3 min my upper arm was swollen 3 times normal" Has patient had a PCN reaction causing immediate rash, facial/tongue/throat swelling, SOB or lightheadedness with hypotension: Yes Has patient had a PCN reaction causing severe rash involving mucus membranes or skin necrosis: No Has patient had a PCN reaction that required hospitalization No Has patient had a PCN reaction occurring within the last 10 years: No If all of the above answers are "NO", then may proceed wi  . Lisinopril Other (See Comments)    Muscle Pain  . Statins Other (See Comments)    Myalgias per patient  . Hydrocodone Other (See Comments)    Intolerant, esp when combined with skelaxin.    . Lidocaine     Hallucinations, jerking  . Pacerone [Amiodarone] Other (See Comments)     Lung and heart problem  . Xanax [Alprazolam] Other (See Comments)    Nightmares.      Patient Measurements: Height: 5\' 3"  (160 cm) Weight: 147 lb (66.7 kg) IBW/kg (Calculated) : 56.9 Heparin Dosing Weight: 66.7  Vital Signs: Temp: 97.9 F (36.6 C) (06/03 0925) Temp Source: Oral (06/03 0925) BP: 109/83 (06/03 1030) Pulse Rate: 55 (06/03 1030)  Labs:  Recent Labs  07/07/16 0932  HGB 10.4*  HCT 33.8*  PLT 348  CREATININE 1.00    Estimated Creatinine Clearance: 53.7 mL/min (by C-G formula based on SCr of 1 mg/dL).   Medical History: Past Medical History:  Diagnosis Date  . Acute lower GI bleeding 12/11/2011   "first time" (12/11/2011)  . Acute myocardial infarction, unspecified site, episode of care unspecified 1995   Pt living in [place], no stent, ?PTCA  . AICD (automatic cardioverter/defibrillator) present   . Allergic rhinitis, cause unspecified   . Anxiety   . Arthritis    "all over" (11/07/2015)  . Atrial fibrillation (HCC)    a. 05/2015 - converted to sinus in setting of ICD shocks; placed on eliquis 5 bid.  [name] CAD (coronary artery disease), autologous vein bypass graft   . Cervical herniated disc    told not to lift >10 lbs  . Chronic systolic CHF (congestive heart failure), NYHA class 2 (HCC)    Reports EF of 25%.   [name] COPD (chronic obstructive pulmonary disease) (HCC) 11/2012   by xray  .  Diverticulosis    by CT scan  . HCAP (healthcare-associated pneumonia) 11/06/2015  . Hypertension   . Insomnia   . Paroxysmal ventricular tachycardia (HCC)   . Perennial allergic rhinitis    only to dust mites  . Pneumonia 2000s   "walking pneumonia"  . VT (ventricular tachycardia) (HCC)    a. 05/2015 - VT storm with multiple ICD shocks-->Amio 400 BID.    Nadara Mustard, PharmD., MS Clinical Pharmacist Pager:  [phone] Thank you for allowing pharmacy to be part of this patients care team. 07/07/2016,11:06 AM

## 2016-07-07 NOTE — ED Triage Notes (Signed)
Onset today patient sitting on couch defibrillator fired 0730 then again at 0810. Called EMS. Patient on 2L [place] at [place] continuously. States chest discomfort prior to arrival with slight shortness of breath. Currently has no complaints.

## 2016-07-07 NOTE — Progress Notes (Signed)
Chaplain responded to STEMI, and did initial encounter. Patient and wife appeared tired. Patient requested drink, but this was not medically allowed. Chaplain offered and patient & wife accepted prayer. Wife became teary during prayer.  Wife stated that patient has had ten hospitalizations in the last 14 months. Provided active listening. Offered ongoing support. Please call as needed or requested.  Theodoro Parma [phone]   07/07/16 1000  Clinical Encounter Type  Visited With Patient and family together  Visit Type Initial;Psychological support;Spiritual support  Referral From (STEMI)  Consult/Referral To Chaplain  Spiritual Encounters  Spiritual Needs Emotional;Prayer  Stress Factors  Patient Stress Factors Health changes  Family Stress Factors Exhausted;Loss of control

## 2016-07-07 NOTE — ED Notes (Signed)
Spoke with pharmacist confirmed Heparin to start at 1900.

## 2016-07-07 NOTE — H&P (Signed)
Admit date: 07/07/2016 Primary Physician  Joaquim Nam, MD Primary Cardiologist  Dr. [name]  CC: Defibrillator shock 68  HPI: 71 year old male with ischemic cardiomyopathy with repeated ICD discharges here with episode 2 of shocks precipitated by sensation of chest tightness/pressure with upper abdominal fullness. There was concern originally about EKG given ST segment elevation in the inferior leads with reciprocal ST depression in lead V2. Subsequent EKG is isoelectric with no ST segment elevation. He is currently chest pain-free. Original troponin in ER point-of-care is 0.  He is known well to Dr. [name]. He has had history of VT ablation. He is currently on sotalol, mexiletine, Ranexa. In the past there was some concern about potential pulmonary toxicity from amiodarone however this is not conclusive.  He denies a recent fevers, chills, orthopnea, PND, syncope.   PMH:   Past Medical History:  Diagnosis Date  . Acute lower GI bleeding 12/11/2011   "first time" (12/11/2011)  . Acute myocardial infarction, unspecified site, episode of care unspecified 1995   Pt living in [place], no stent, ?PTCA  . AICD (automatic cardioverter/defibrillator) present   . Allergic rhinitis, cause unspecified   . Anxiety   . Arthritis    "all over" (11/07/2015)  . Atrial fibrillation (HCC)    a. 05/2015 - converted to sinus in setting of ICD shocks; placed on eliquis 5 bid.  [name] CAD (coronary artery disease), autologous vein bypass graft   . Cervical herniated disc    told not to lift >10 lbs  . Chronic systolic CHF (congestive heart failure), NYHA class 2 (HCC)    Reports EF of 25%.   [name] COPD (chronic obstructive pulmonary disease) (HCC) 11/2012   by xray  . Diverticulosis    by CT scan  . HCAP (healthcare-associated pneumonia) 11/06/2015  . Hypertension   . Insomnia   . Paroxysmal ventricular tachycardia (HCC)   . Perennial allergic rhinitis    only to dust mites  . Pneumonia 2000s   "walking pneumonia"  . VT (ventricular tachycardia) (HCC)    a. 05/2015 - VT storm with multiple ICD shocks-->Amio 400 BID.    PSH:   Past Surgical History:  Procedure Laterality Date  . CARDIAC CATHETERIZATION  2004   LAD 30%, D1 30%, CFX-AV groove 70-80%, OM1 30%, EF 20-25%  . CARDIAC CATHETERIZATION N/A 09/28/2015   Procedure: Left Heart Cath and Coronary Angiography;  Surgeon: Peter M Swaziland, MD;  Location: [facility] INVASIVE CV LAB;  Service: Cardiovascular;  Laterality: N/A;  . CARDIAC DEFIBRILLATOR PLACEMENT  2004  . CATARACT EXTRACTION W/ INTRAOCULAR LENS IMPLANT Right 01/2012  . COLONOSCOPY  01/08/2012   Procedure: COLONOSCOPY;  Surgeon: Iva Boop, MD;  Location: [facility] ENDOSCOPY;  Service: Endoscopy;  Laterality: N/A;  . CORONARY ANGIOPLASTY  1995   Pt thinks he got a balloon, living in [place], [place]  . IMPLANTABLE CARDIOVERTER DEFIBRILLATOR GENERATOR CHANGE N/A 02/07/2012   Procedure: IMPLANTABLE CARDIOVERTER DEFIBRILLATOR GENERATOR CHANGE;  Surgeon: Marinus Maw, MD; Medtronic Evera XT VR single-chamber serial number [id number], Laterality: Left  . INSERT / REPLACE / REMOVE PACEMAKER  2004   Medtronic ICD  . KNEE ARTHROSCOPY Left 05/2003   Hattie Perch 06/19/2010  . LAPAROSCOPIC CHOLECYSTECTOMY  1/ 2012  . SHOULDER ARTHROSCOPY W/ ROTATOR CUFF REPAIR Right twice  . TONSILLECTOMY AND ADENOIDECTOMY  ~ 1951  . V TACH ABLATION N/A 04/29/2016   Procedure: V Tach Ablation;  Surgeon: Marinus Maw, MD;  Location: [facility] INVASIVE CV LAB;  Service: Cardiovascular;  Laterality: N/A;   Allergies:  Ace inhibitors; Codeine; Doxycycline; Kionex [sodium polystyrene sulfonate]; Penicillins; Lisinopril; Statins; Hydrocodone; Lidocaine; Pacerone [amiodarone]; and Xanax [alprazolam] Prior to Admit Meds:   Prior to Admission medications   Medication Sig Start Date End Date Taking? Authorizing Provider  acetaminophen (TYLENOL) 325 MG tablet Take 2 tablets (650 mg total) by mouth every 6 (six) hours as needed for  mild pain (or Fever >/= 101). 12/06/15   Joseph Art, DO  apixaban (ELIQUIS) 2.5 MG TABS tablet Take 1 tablet (2.5 mg total) by mouth 2 (two) times daily. 04/05/16   Marinus Maw, MD  benzonatate (TESSALON) 200 MG capsule Take 1 capsule (200 mg total) by mouth every 4 (four) hours as needed for cough. 12/06/15   Joseph Art, DO  Chlorpheniramine-DM (CORICIDIN HBP COUGH/COLD PO) Take 2 capsules by mouth daily as needed (cold).     [provider]  furosemide (LASIX) 20 MG tablet Take 1 tablet (20 mg total) by mouth every Monday, Wednesday, and Friday. 04/05/16   Marinus Maw, MD  guaiFENesin (MUCINEX) 600 MG 12 hr tablet Take 600 mg by mouth 2 (two) times daily.     [provider]  loperamide (IMODIUM) 2 MG capsule Take 1 capsule (2 mg total) by mouth as needed for diarrhea or loose stools. Patient taking differently: Take 4 mg by mouth See admin instructions. Take 2 tablets after each bowl movement. 11/21/15   Dorothea Ogle, MD  metaxalone (SKELAXIN) 800 MG tablet Take 1 tablet (800 mg total) by mouth daily as needed for muscle spasms. Patient taking differently: Take 400 mg by mouth daily as needed for muscle spasms.  02/12/16   Joaquim Nam, MD  mexiletine (MEXITIL) 150 MG capsule Take 2 capsules (300 mg total) by mouth every 8 (eight) hours. 04/25/16   Sheilah Pigeon, PA-C  nitroGLYCERIN (NITROSTAT) 0.4 MG SL tablet Place 1 tablet (0.4 mg total) under the tongue every 5 (five) minutes as needed for chest pain. 03/13/16   Arty Baumgartner, NP  nitroGLYCERIN (NITROSTAT) 0.4 MG SL tablet PLACE 1 TABLET (0.4 MG TOTAL) UNDER THE TONGUE EVERY 5 (FIVE) MINUTES AS NEEDED. FOR CHEST PAIN. 07/04/16   Marinus Maw, MD  NON FORMULARY Oxygen 3 liters   24/7    [provider]  promethazine (PHENERGAN) 25 MG tablet Take 25 mg by mouth every 6 (six) hours as needed for nausea or vomiting.    [provider]  RANEXA 500 MG 12 hr tablet TAKE 1 TABLET (500 MG  TOTAL) BY MOUTH 2 (TWO) TIMES DAILY. 04/01/16   Marily Lente, NP  ranitidine (ZANTAC) 150 MG capsule Take 150 mg by mouth daily as needed for heartburn.     [provider]  Simethicone (GAS-X PO) Take 1 tablet by mouth as needed (for gas). As needed.     [provider]  sotalol (BETAPACE) 120 MG tablet Take 1 tablet (120 mg total) by mouth every 12 (twelve) hours. 06/22/16   Bhagat, Sharrell Ku, PA  traMADol (ULTRAM) 50 MG tablet Take 1 tablet (50 mg total) by mouth every 8 (eight) hours as needed (for pain). 05/16/16   Joaquim Nam, MD  zolpidem (AMBIEN) 10 MG tablet Take 0.5-1 tablets (5-10 mg total) by mouth at bedtime as needed for sleep. 05/16/16 06/19/16  Joaquim Nam, MD   Fam HX:    Family History  Problem Relation Age of Onset  . Diabetes Father   .  Tracheal cancer Father 19       smoker  . Stroke Mother   . Cancer Sister        left eye  . CAD Neg Hx   . Colon cancer Neg Hx   . Prostate cancer Neg Hx    Social HX:    Social History   Social History  . Marital status: Married    Spouse name: N/A  . Number of children: 0  . Years of education: N/A   Occupational History  . [employer] truck driver (retired)    Social History Main Topics  . Smoking status: Former Smoker    Packs/day: 0.50    Years: 50.00    Types: Cigarettes, Cigars    Quit date: 09/27/2015  . Smokeless tobacco: Never Used  . Alcohol use No  . Drug use: No  . Sexual activity: Not Currently   Other Topics Concern  . Not on file   Social History Narrative   Lives with wife, married 1998   Grown children, 2 great grandchildren   Occupation: retired, was Presenter, broadcasting   Activity: walking, fishing   Diet: good water daily, fruits/vegetables rare      Wife is Product manager.    [id number], Human resources officer. No known agent orange exposure.       ROS:  All 11 ROS were addressed and are negative except what is stated in the HPI   Physical Exam: Blood pressure 109/83, pulse (!) 55,  temperature 97.9 F (36.6 C), temperature source Oral, resp. rate 16, height 5\' 3"  (1.6 m), weight 147 lb (66.7 kg), SpO2 100 %.   General: Well developed, well nourished, in no acute distress Head: Eyes PERRLA, No xanthomas.   Normal cephalic and atramatic  Lungs:  Clear bilaterally to auscultation and percussion. Normal respiratory effort. No wheezes, no rales. Heart:  HRRR S1 S2 Pulses are 2+ & equal. 1/6 systolic apical murmur, no rubs or gallops. ICD in place            No carotid bruit. No JVD.  No abdominal bruits. Abdomen: Bowel sounds are positive, abdomen soft and non-tender without masses or                 Hernia's noted. No hepatosplenomegaly. Msk:  Back normal, normal gait. Normal strength and tone for age. Extremities:  No clubbing, cyanosis or edema.  DP +1 Neuro: Alert and oriented X 3, non-focal, MAE x 4 GU: Deferred Rectal: Deferred Psych:  Good affect, responds appropriately         Labs:   Lab Results  Component Value Date   WBC 10.1 07/07/2016   HGB 10.4 (L) 07/07/2016   HCT 33.8 (L) 07/07/2016   MCV 78.6 07/07/2016   PLT 348 07/07/2016    Recent Labs Lab 07/07/16 0932  NA 139  K 4.4  CL 110  CO2 22  BUN 20  CREATININE 1.00  CALCIUM 8.7*  PROT 6.3*  BILITOT 0.3  ALKPHOS 73  ALT 9*  AST 17  GLUCOSE 89   No results for input(s): CKTOTAL, CKMB, TROPONINI in the last 72 hours. Lab Results  Component Value Date   CHOL 170 09/12/2014   HDL 41.10 09/12/2014   LDLCALC 105 (H) 09/12/2014   TRIG 117.0 09/12/2014   No results found for: DDIMER   Radiology:  Dg Chest Portable 1 View  Result Date: 07/07/2016 CLINICAL DATA:  Chest pain, shortness of Breath EXAM: PORTABLE CHEST 1 VIEW COMPARISON:  06/19/2016  FINDINGS: AICD remains in place, unchanged. Cardiomegaly. No confluent airspace opacities, effusions or edema. No acute bony abnormality. IMPRESSION: Cardiomegaly.  No active disease. Electronically Signed   By: Charlett Nose M.D.   On: 07/07/2016  10:07   Personally viewed.   EKG:  As described in H&P, original EKG upon admission showed ST segment elevation in the inferior leads, Q waves inferiorly with reciprocal ST depression in V2 concerning for inferior STEMI. Repeat EKG shows normalization of ST segments with continued right bundle branch block like appearance. Personally viewed.  Cardiac catheterization 09/28/15  Prox RCA to Mid RCA lesion, 100 %stenosed.  Ost LM to LM lesion, 30 %stenosed.  Mid Cx lesion, 70 %stenosed.  Ost 3rd Mrg to 3rd Mrg lesion, 75 %stenosed.  Prox LAD lesion, 35 %stenosed.  There is severe left ventricular systolic dysfunction.  LV end diastolic pressure is severely elevated.  The left ventricular ejection fraction is less than 25% by visual estimate.  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   1. 2 vessel obstructive CAD. There is chronic total occlusion of the proximal RCA. There is moderate disease in the distal LCx and third OM. This is unchanged from 2004. There is new aneurysmal dilation of the distal left main. 2. Severe LV dysfunction  3. Marked elevation of the LVEDP.  Plan: continue medical therapy. He may benefit from additional diuresis.   Echocardiogram 11/30/15: - Left ventricle: The cavity size was mildly dilated. Wall   thickness was normal. Systolic function was severely reduced. The   estimated ejection fraction was in the range of 25% to 30%. There   is akinesis of the inferolateral and inferior myocardium.   Features are consistent with a pseudonormal left ventricular   filling pattern, with concomitant abnormal relaxation and   increased filling pressure (grade 2 diastolic dysfunction).   Doppler parameters are consistent with high ventricular filling   pressure. - Aortic valve: There was mild regurgitation. - Mitral valve: There was severe regurgitation. - Left atrium: The atrium was moderately dilated. - Right atrium: The atrium was moderately dilated. -  Tricuspid valve: There was moderate regurgitation. - Pulmonary arteries: Systolic pressure was moderately increased.  Impressions:  - Definity used; akinesis of the inferior and inferior lateral wall   with overall severely reduced LV systolic function; grade 2   diastolic dysfunction with elevated LV filling pressure; biatrial   enlargement; mild AI; severe MR; moderate TR with moderately   elevated pulmonary pressure.  ASSESSMENT/PLAN:   71 year old male with stage D chronic systolic heart failure, dilated ischemic cardiomyopathy EF 25% with ICD, frequent episodes of ventricular tachycardia with subsequent discharge of his ICD status post ventricular tachycardia ablation, known CAD with occluded right coronary artery, severe mitral regurgitation.  Ventricular tachycardia  - Several discussions took place between myself, Dr. Lucky Rathke and Dr. Nanetta Batty of interventional cardiology. I have also placed a phone call into Dr. Lewayne Bunting and discussed his case with him. He has had a long-standing history of ventricular tachycardia with difficulty controlling adverse arrhythmia.  - We will hold off on cardiac catheterization only if troponin increases, possibly to elevation greater than 5 for instance. Low level troponin is to be expected after defibrillation firing.  - In anticipation of possible cardiac catheterization, we will hold his Eliquis, dose was given this morning. We will start heparin per pharmacy likely tonight. We can always resume the Eliquis if cardiac catheterization is not necessary.  - For now, continue with current antiarrhythmic therapy as  described above. In discussion with Dr. [name], we may consider reinitiating amiodarone. There was a concern previously about pulmonary toxicity issues. At this stage, we may need to retry this medication.  Stage D ischemic dilated cardiomyopathy with chronic systolic heart failure  - Appears fairly euvolemic currently. No change in  medications. No increase in diuresis at this point.  CAD  - Cardiac catheterization reviewed. Known occluded right. Continue with secondary prevention strategies.  Ultimately, Mr. Kemna may be reaching the end of his illness after talking with Dr. [name]. At some point, may need to consider palliative efforts.    Critical care time 45 minutes spent with patient, patient's family, extensive conversations with interventional cardiology as well as emergency department and Dr. [name] in this gentleman with critical arrhythmia with multiple comorbidities.   Donato Schultz, MD  07/07/2016  11:05 AM

## 2016-07-07 NOTE — ED Notes (Signed)
[employer]tr[employer] called states will fax information stated shocked patient 5/30 x1 and 6/3 x2 stated HR [id number] when shocked.

## 2016-07-07 NOTE — ED Provider Notes (Signed)
[facility]-EMERGENCY DEPT Provider Note   CSN: [id number] Arrival date & time: 07/07/16  1324     History   Chief Complaint Chief Complaint  Patient presents with  . Shortness of Breath  . Chest Pain    HPI [name] is a 71 y.o. male.  Patient states that he was having chest pressure off and on all day yesterday. His defibrillator went off on Wednesday and went off twice this morning. He came to the hospital because his defibrillator went off twice   The history is provided by the patient. No language interpreter was used.  Chest Pain   This is a recurrent problem. The current episode started 12 to 24 hours ago. The problem occurs every several days. The problem has been resolved. The pain is associated with walking. The pain is present in the substernal region. The pain is at a severity of 5/10. The pain is moderate. The quality of the pain is described as dull. The pain does not radiate. The symptoms are aggravated by exertion. Pertinent negatives include no abdominal pain, no back pain, no cough and no headaches.  Pertinent negatives for past medical history include no seizures.    Past Medical History:  Diagnosis Date  . Acute lower GI bleeding 12/11/2011   "first time" (12/11/2011)  . Acute myocardial infarction, unspecified site, episode of care unspecified 1995   Pt living in [place], no stent, ?PTCA  . AICD (automatic cardioverter/defibrillator) present   . Allergic rhinitis, cause unspecified   . Anxiety   . Arthritis    "all over" (11/07/2015)  . Atrial fibrillation (HCC)    a. 05/2015 - converted to sinus in setting of ICD shocks; placed on eliquis 5 bid.  [name] CAD (coronary artery disease), autologous vein bypass graft   . Cervical herniated disc    told not to lift >10 lbs  . Chronic systolic CHF (congestive heart failure), NYHA class 2 (HCC)    Reports EF of 25%.   [name] COPD (chronic obstructive pulmonary disease) (HCC) 11/2012   by xray  . Diverticulosis    by  CT scan  . HCAP (healthcare-associated pneumonia) 11/06/2015  . Hypertension   . Insomnia   . Paroxysmal ventricular tachycardia (HCC)   . Perennial allergic rhinitis    only to dust mites  . Pneumonia 2000s   "walking pneumonia"  . VT (ventricular tachycardia) (HCC)    a. 05/2015 - VT storm with multiple ICD shocks-->Amio 400 BID.    Patient Active Problem List   Diagnosis Date Noted  . ICD (implantable cardioverter-defibrillator) discharge 06/20/2016  . Atrial tachycardia (HCC) 06/20/2016  . Ventricular tachycardia (HCC) 06/20/2016  . Hyperkalemia 02/13/2016  . AKI (acute kidney injury) (HCC)   . Centrilobular emphysema (HCC)   . Postcholecystectomy diarrhea 12/12/2015  . Hypoalbuminemia due to protein-calorie malnutrition (HCC)   . Anemia of chronic disease   . Chronic systolic CHF (congestive heart failure) (HCC)   . Coronary artery disease involving native coronary artery of native heart without angina pectoris   . S/P implantation of automatic cardioverter/defibrillator (AICD)   . Supplemental oxygen dependent   . Physical deconditioning 11/28/2015  . Transaminitis 11/28/2015  . Hypoxia   . Fall at home 10/11/2015  . VT (ventricular tachycardia) (HCC) 09/27/2015  . Memory loss 09/24/2015  . Encounter for screening examination for infectious disease 09/24/2015  . Chronic atrial fibrillation (HCC) 05/28/2015  . Syncope 05/12/2015  . Advance care planning 09/16/2014  . Muscle ache 09/16/2014  .  Back pain 09/08/2013  . Abdominal pain, chronic, epigastric 09/08/2013  . Fatigue 06/11/2013  . Chest wall mass 11/05/2012  . Arthritis   . Insomnia   . Anxiety   . Ischemic cardiomyopathy 02/12/2011  . Acute on chronic systolic congestive heart failure (HCC) 05/10/2010  . Hyperlipidemia 08/25/2008  . Amiodarone pulmonary toxicity 08/25/2008    Past Surgical History:  Procedure Laterality Date  . CARDIAC CATHETERIZATION  2004   LAD 30%, D1 30%, CFX-AV groove 70-80%, OM1  30%, EF 20-25%  . CARDIAC CATHETERIZATION N/A 09/28/2015   Procedure: Left Heart Cath and Coronary Angiography;  Surgeon: Peter M Swaziland, MD;  Location: [facility] INVASIVE CV LAB;  Service: Cardiovascular;  Laterality: N/A;  . CARDIAC DEFIBRILLATOR PLACEMENT  2004  . CATARACT EXTRACTION W/ INTRAOCULAR LENS IMPLANT Right 01/2012  . COLONOSCOPY  01/08/2012   Procedure: COLONOSCOPY;  Surgeon: Iva Boop, MD;  Location: [facility] ENDOSCOPY;  Service: Endoscopy;  Laterality: N/A;  . CORONARY ANGIOPLASTY  1995   Pt thinks he got a balloon, living in [place], [place]  . IMPLANTABLE CARDIOVERTER DEFIBRILLATOR GENERATOR CHANGE N/A 02/07/2012   Procedure: IMPLANTABLE CARDIOVERTER DEFIBRILLATOR GENERATOR CHANGE;  Surgeon: Marinus Maw, MD; Medtronic Evera XT VR single-chamber serial number [id number], Laterality: Left  . INSERT / REPLACE / REMOVE PACEMAKER  2004   Medtronic ICD  . KNEE ARTHROSCOPY Left 05/2003   Hattie Perch 06/19/2010  . LAPAROSCOPIC CHOLECYSTECTOMY  1/ 2012  . SHOULDER ARTHROSCOPY W/ ROTATOR CUFF REPAIR Right twice  . TONSILLECTOMY AND ADENOIDECTOMY  ~ 1951  . V TACH ABLATION N/A 04/29/2016   Procedure: V Tach Ablation;  Surgeon: Marinus Maw, MD;  Location: [facility] INVASIVE CV LAB;  Service: Cardiovascular;  Laterality: N/A;       Home Medications    Prior to Admission medications   Medication Sig Start Date End Date Taking? Authorizing Provider  acetaminophen (TYLENOL) 325 MG tablet Take 2 tablets (650 mg total) by mouth every 6 (six) hours as needed for mild pain (or Fever >/= 101). 12/06/15    Art, DO  apixaban (ELIQUIS) 2.5 MG TABS tablet Take 1 tablet (2.5 mg total) by mouth 2 (two) times daily. 04/05/16   Marinus Maw, MD  benzonatate (TESSALON) 200 MG capsule Take 1 capsule (200 mg total) by mouth every 4 (four) hours as needed for cough. 12/06/15    Art, DO  Chlorpheniramine-DM (CORICIDIN HBP COUGH/COLD PO) Take 2 capsules by mouth daily as needed (cold).     [provider]  furosemide (LASIX) 20 MG tablet Take 1 tablet (20 mg total) by mouth every Monday, Wednesday, and Friday. 04/05/16   Marinus Maw, MD  guaiFENesin (MUCINEX) 600 MG 12 hr tablet Take 600 mg by mouth 2 (two) times daily.     [provider]  loperamide (IMODIUM) 2 MG capsule Take 1 capsule (2 mg total) by mouth as needed for diarrhea or loose stools. Patient taking differently: Take 4 mg by mouth See admin instructions. Take 2 tablets after each bowl movement. 11/21/15   Dorothea Ogle, MD  metaxalone (SKELAXIN) 800 MG tablet Take 1 tablet (800 mg total) by mouth daily as needed for muscle spasms. Patient taking differently: Take 400 mg by mouth daily as needed for muscle spasms.  02/12/16   Joaquim Nam, MD  mexiletine (MEXITIL) 150 MG capsule Take 2 capsules (300 mg total) by mouth every 8 (eight) hours. 04/25/16   Sheilah Pigeon, PA-C  nitroGLYCERIN (NITROSTAT) 0.4  MG SL tablet Place 1 tablet (0.4 mg total) under the tongue every 5 (five) minutes as needed for chest pain. 03/13/16   Arty Baumgartner, NP  nitroGLYCERIN (NITROSTAT) 0.4 MG SL tablet PLACE 1 TABLET (0.4 MG TOTAL) UNDER THE TONGUE EVERY 5 (FIVE) MINUTES AS NEEDED. FOR CHEST PAIN. 07/04/16   Marinus Maw, MD  NON FORMULARY Oxygen 3 liters   24/7    [provider]  promethazine (PHENERGAN) 25 MG tablet Take 25 mg by mouth every 6 (six) hours as needed for nausea or vomiting.    [provider]  RANEXA 500 MG 12 hr tablet TAKE 1 TABLET (500 MG TOTAL) BY MOUTH 2 (TWO) TIMES DAILY. 04/01/16   Marily Lente, NP  ranitidine (ZANTAC) 150 MG capsule Take 150 mg by mouth daily as needed for heartburn.     [provider]  Simethicone (GAS-X PO) Take 1 tablet by mouth as needed (for gas). As needed.     [provider]  sotalol (BETAPACE) 120 MG tablet Take 1 tablet (120 mg total) by mouth every 12 (twelve) hours. 06/22/16   Bhagat, Sharrell Ku, PA  traMADol (ULTRAM) 50 MG tablet  Take 1 tablet (50 mg total) by mouth every 8 (eight) hours as needed (for pain). 05/16/16   Joaquim Nam, MD  zolpidem (AMBIEN) 10 MG tablet Take 0.5-1 tablets (5-10 mg total) by mouth at bedtime as needed for sleep. 05/16/16 06/19/16  Joaquim Nam, MD    Family History Family History  Problem Relation Age of Onset  . Diabetes Father   . Tracheal cancer Father 36       smoker  . Stroke Mother   . Cancer Sister        left eye  . CAD Neg Hx   . Colon cancer Neg Hx   . Prostate cancer Neg Hx     Social History Social History  Substance Use Topics  . Smoking status: Former Smoker    Packs/day: 0.50    Years: 50.00    Types: Cigarettes, Cigars    Quit date: 09/27/2015  . Smokeless tobacco: Never Used  . Alcohol use No     Allergies   Ace inhibitors; Codeine; Doxycycline; Kionex [sodium polystyrene sulfonate]; Penicillins; Lisinopril; Statins; Hydrocodone; Lidocaine; Pacerone [amiodarone]; and Xanax [alprazolam]   Review of Systems Review of Systems  Constitutional: Negative for appetite change and fatigue.  HENT: Negative for congestion, ear discharge and sinus pressure.   Eyes: Negative for discharge.  Respiratory: Negative for cough.   Cardiovascular: Positive for chest pain.  Gastrointestinal: Negative for abdominal pain and diarrhea.  Genitourinary: Negative for frequency and hematuria.  Musculoskeletal: Negative for back pain.  Skin: Negative for rash.  Neurological: Negative for seizures and headaches.  Psychiatric/Behavioral: Negative for hallucinations.     Physical Exam Updated Vital Signs BP 109/78   Pulse (!) 50   Temp 97.9 F (36.6 C) (Oral)   Resp 16   Ht 5\' 3"  (1.6 m)   Wt 66.7 kg (147 lb)   SpO2 97%   BMI 26.04 kg/m   Physical Exam  Constitutional: He is oriented to person, place, and time. He appears well-developed.  HENT:  Head: Normocephalic.  Eyes: Conjunctivae and EOM are normal. No scleral icterus.  Neck: Neck supple. No  thyromegaly present.  Cardiovascular: Normal rate and regular rhythm.  Exam reveals no gallop and no friction rub.   No murmur heard. Pulmonary/Chest: No stridor. He has no wheezes. He  has no rales. He exhibits no tenderness.  Abdominal: He exhibits no distension. There is no tenderness. There is no rebound.  Musculoskeletal: Normal range of motion. He exhibits no edema.  Lymphadenopathy:    He has no cervical adenopathy.  Neurological: He is oriented to person, place, and time. He exhibits normal muscle tone. Coordination normal.  Skin: No rash noted. No erythema.  Psychiatric: He has a normal mood and affect. His behavior is normal.     ED Treatments / Results  Labs (all labs ordered are listed, but only abnormal results are displayed) Labs Reviewed  CBC WITH DIFFERENTIAL/PLATELET - Abnormal; Notable for the following:       Result Value   Hemoglobin 10.4 (*)    HCT 33.8 (*)    MCH 24.2 (*)    RDW 17.6 (*)    All other components within normal limits  COMPREHENSIVE METABOLIC PANEL  I-STAT TROPOININ, ED  I-STAT CHEM 8, ED    EKG  EKG Interpretation  Date/Time:  Sunday July 07 2016 09:14:57 EDT Ventricular Rate:  103 PR Interval:    QRS Duration: 225 QT Interval:  490 QTC Calculation: 642 R Axis:   23 Text Interpretation:  Sinus tachycardia Prolonged PR interval Nonspecific intraventricular conduction delay Repol abnrm, severe global ischemia (LM/MVD) Confirmed by Nolyn Eilert  MD, Adir Schicker [phone]) on 07/07/2016 9:29:15 AM       Radiology Dg Chest Portable 1 View  Result Date: 07/07/2016 CLINICAL DATA:  Chest pain, shortness of Breath EXAM: PORTABLE CHEST 1 VIEW COMPARISON:  06/19/2016 FINDINGS: AICD remains in place, unchanged. Cardiomegaly. No confluent airspace opacities, effusions or edema. No acute bony abnormality. IMPRESSION: Cardiomegaly.  No active disease. Electronically Signed   By: Charlett Nose M.D.   On: 07/07/2016 10:07    Procedures Procedures (including  critical care time)  Medications Ordered in ED Medications  sodium chloride 0.9 % bolus 500 mL (500 mLs Intravenous New Bag/Given 07/07/16 0955)     Initial Impression / Assessment and Plan / ED Course  I have reviewed the triage vital signs and the nursing notes.  Pertinent labs & imaging results that were available during my care of the patient were reviewed by me and considered in my medical decision making (see chart for details).    CRITICAL CARE Performed by: Yoshi Mancillas L Total critical care time: 40 minutes Critical care time was exclusive of separately billable procedures and treating other patients. Critical care was necessary to treat or prevent imminent or life-threatening deterioration. Critical care was time spent personally by me on the following activities: development of treatment plan with patient and/or surrogate as well as nursing, discussions with consultants, evaluation of patient's response to treatment, examination of patient, obtaining history from patient or surrogate, ordering and performing treatments and interventions, ordering and review of laboratory studies, ordering and review of radiographic studies, pulse oximetry and re-evaluation of patient's condition.   When patient presented to the emergency room because he had to cardioversions today. His EKG was concerning for a possible inferior STEMI. I spoke with the STEMI doctor. Dr. Gery Pray and he elected EKG and suggested that Dr. Anne Fu be consulted.  Dr. Fayrene Fearing and Dr. Gery Pray spoke to each other and decided that the patient should go to the Cath Lab. The patient had a repeat EKG at 1008 that did not show the changes consistent with STEMI. Cardiology will take care of the patient  Final Clinical Impressions(s) / ED Diagnoses   Final diagnoses:  None  New Prescriptions New Prescriptions   No medications on file     Bethann Berkshire, MD 07/07/16 1022

## 2016-07-07 NOTE — ED Notes (Signed)
Lunch tray ordered; heart healthy diet 

## 2016-07-08 ENCOUNTER — Telehealth: Payer: Self-pay | Admitting: Cardiology

## 2016-07-08 ENCOUNTER — Encounter: Payer: Medicare Other | Admitting: *Deleted

## 2016-07-08 ENCOUNTER — Encounter (HOSPITAL_COMMUNITY)
Admission: EM | Disposition: A | Discharge status: Home / Self Care | Payer: Self-pay | Source: Home / Self Care | Attending: Internal Medicine

## 2016-07-08 LAB — POCT I-STAT, CHEM 8
BUN: 22 mg/dL — ABNORMAL HIGH (ref 6–20)
CHLORIDE: 108 mmol/L (ref 101–111)
Calcium, Ion: 1.21 mmol/L (ref 1.15–1.40)
Creatinine, Ser: 1.1 mg/dL (ref 0.61–1.24)
GLUCOSE: 83 mg/dL (ref 65–99)
HEMATOCRIT: 33 % — AB (ref 39.0–52.0)
Hemoglobin: 11.2 g/dL — ABNORMAL LOW (ref 13.0–17.0)
Potassium: 4.4 mmol/L (ref 3.5–5.1)
Sodium: 142 mmol/L (ref 135–145)
TCO2: 26 mmol/L (ref 0–100)

## 2016-07-08 LAB — HEPARIN LEVEL (UNFRACTIONATED): Heparin Unfractionated: 0.76 IU/mL — ABNORMAL HIGH (ref 0.30–0.70)

## 2016-07-08 LAB — BASIC METABOLIC PANEL
ANION GAP: 10 (ref 5–15)
BUN: 22 mg/dL — AB (ref 6–20)
CHLORIDE: 105 mmol/L (ref 101–111)
CO2: 22 mmol/L (ref 22–32)
Calcium: 8.4 mg/dL — ABNORMAL LOW (ref 8.9–10.3)
Creatinine, Ser: 1.22 mg/dL (ref 0.61–1.24)
GFR calc non Af Amer: 57 mL/min — ABNORMAL LOW (ref >60)
Glucose, Bld: 156 mg/dL — ABNORMAL HIGH (ref 65–99)
POTASSIUM: 3.7 mmol/L (ref 3.5–5.1)
SODIUM: 137 mmol/L (ref 135–145)

## 2016-07-08 LAB — SEDIMENTATION RATE: SED RATE: 13 mm/h (ref 0–16)

## 2016-07-08 LAB — MAGNESIUM: MAGNESIUM: 1.9 mg/dL (ref 1.7–2.4)

## 2016-07-08 LAB — APTT: aPTT: >200 seconds (ref 24–36)

## 2016-07-08 SURGERY — LEFT HEART CATH AND CORONARY ANGIOGRAPHY
Anesthesia: LOCAL

## 2016-07-08 MED ORDER — POTASSIUM CHLORIDE CRYS ER 20 MEQ PO TBCR
20.0000 meq | EXTENDED_RELEASE_TABLET | Freq: Once | ORAL | Status: AC
  Administered 2016-07-08: 20 meq via ORAL
  Filled 2016-07-08: qty 1

## 2016-07-08 MED ORDER — AMIODARONE HCL IN DEXTROSE 360-4.14 MG/200ML-% IV SOLN
60.0000 mg/h | INTRAVENOUS | Status: AC
  Administered 2016-07-08: 60 mg/h via INTRAVENOUS
  Filled 2016-07-08: qty 200

## 2016-07-08 MED ORDER — APIXABAN 5 MG PO TABS
5.0000 mg | ORAL_TABLET | Freq: Two times a day (BID) | ORAL | Status: DC
  Filled 2016-07-08: qty 1

## 2016-07-08 MED ORDER — APIXABAN 2.5 MG PO TABS
2.5000 mg | ORAL_TABLET | Freq: Two times a day (BID) | ORAL | Status: DC
  Administered 2016-07-08 – 2016-07-11 (×7): 2.5 mg via ORAL
  Filled 2016-07-08 (×7): qty 1

## 2016-07-08 MED ORDER — AMIODARONE HCL IN DEXTROSE 360-4.14 MG/200ML-% IV SOLN
30.0000 mg/h | INTRAVENOUS | Status: DC
  Administered 2016-07-08 – 2016-07-11 (×6): 30 mg/h via INTRAVENOUS
  Filled 2016-07-08 (×7): qty 200

## 2016-07-08 MED ORDER — AMIODARONE LOAD VIA INFUSION
150.0000 mg | Freq: Once | INTRAVENOUS | Status: AC
  Administered 2016-07-08: 150 mg via INTRAVENOUS
  Filled 2016-07-08: qty 83.34

## 2016-07-08 NOTE — Progress Notes (Signed)
   07/08/16 2155  Vitals  BP 129/87  MAP (mmHg) 97  ECG Heart Rate 94  Resp (!) 35  Pt had  vtach from 21:52 to 21:59 sustained, pt was trying to get up to use the commode, placed patient back on bed, vital signs taken, pt already on amiodarone drip. Dr. Armanda Magic notified and ordered amiodarone 150mg  IV bolus and stat BMET and Mag. Pt back in NSR HR=68.

## 2016-07-08 NOTE — Progress Notes (Signed)
ANTICOAGULATION CONSULT NOTE - Follow Up Consult  Pharmacy Consult for heparin Indication: chest pain/ACS and atrial fibrillation  Labs:  Recent Labs  07/07/16 0932 07/07/16 1634 07/07/16 2244 07/08/16 0401  HGB 10.4*  --   --   --   HCT 33.8*  --   --   --   PLT 348  --   --   --   APTT 65*  --   --  >200*  LABPROT 15.5*  --   --   --   INR 1.22  --   --   --   HEPARINUNFRC 2.20*  --   --  0.76*  CREATININE 1.00  --   --   --   TROPONINI 0.03* 0.03* 0.03*  --     Assessment/Plan:  71yo male appears above PTT goal on heparin while Eliquis on hold though heparin level has dropped significantly from >2.2 to 0.76, which is close to goal and likely still affected by Eliquis, so true heparin level probably at least <0.7. Will continue gtt at current rate and confirm with additional labs.   Vernard Gambles, PharmD, BCPS  07/08/2016,7:31 AM

## 2016-07-08 NOTE — Telephone Encounter (Signed)
LMOVM reminding pt to send remote transmission.   

## 2016-07-08 NOTE — Progress Notes (Signed)
Pt's potassium is 3.7, Dr. Armanda Magic notified and order to give 20 mEq KCL po.

## 2016-07-08 NOTE — Consult Note (Addendum)
   [facility] CM Inpatient Consult   07/08/2016  [name] [birth year] [phone]     Patient screened for [facility] Care Management services. Went to bedside to offer and explain [facility] Care Management program with patient.   [name] pleasantly declined [facility] Care Management follow up. States his wife does a good job of helping him manage his health at home.  Accepted [facility] Care Management brochure with contact information to call in future if changes mind.  Will make inpatient RNCM aware that [name] declined [facility] Care Management program services.  Raiford Noble, MSN-Ed, RN,BSN [facility] Liaison [phone]

## 2016-07-08 NOTE — Consult Note (Signed)
ELECTROPHYSIOLOGY CONSULT NOTE    Patient ID: RONAV FURNEY MRN: [id number], DOB/AGE: 72-Apr-1947 72 y.o.  Admit date: 07/07/2016 Date of Consult: 07/08/2016  Primary Physician: Tonia Ghent, MD Electrophysiologist: Lovena Le  Reason for Consultation: VT with ICD shocks  HPI:  [name] is a 71 y.o. male  EP has been asked to see by Dr Marlou Porch for evaluation of VT with appropriate ICD therapy.  Past medical history is significant for CAD/ICM, persistent atrial fibrillation, COPD, anxiety, and possible amiodarone toxicity. He has a long VT history and has previously been on Mexiletine, Ranexa, amiodarone (discontinued 2/2 concerns for lung toxicity), hallucinations with lidocaine, GI intolerance to quinidine. Prior VT ablation demonstrated multiple VT's with source close to His Bundle.   He was most recently discharged after appropriate ICD therapy 06/22/16 with recurrent VT and was started on Sotalol at that time. He had appropriate ICD therapy again yesterday and presented to the ER for evaluation.  Device interrogation showed appropriate therapy for VT.  He reports compliance with Sotalol and Mexiletine since discharge.  He continues with fatigue and exercise intolerance. Shortness of breath is stable from last week..  No fevers or chills.   Past Medical History:  Diagnosis Date  . Acute lower GI bleeding 12/11/2011   "first time" (12/11/2011)  . Acute myocardial infarction, unspecified site, episode of care unspecified 1995   Pt living in [place], no stent, ?PTCA  . AICD (automatic cardioverter/defibrillator) present   . Allergic rhinitis, cause unspecified   . Anxiety   . Arthritis    "all over" (11/07/2015)  . Atrial fibrillation ([place])    a. 05/2015 - converted to sinus in setting of ICD shocks; placed on eliquis 5 bid.  [name] CAD (coronary artery disease), autologous vein bypass graft   . Cervical herniated disc    told not to lift >10 lbs  . Chronic systolic CHF (congestive  heart failure), NYHA class 2 (HCC)    Reports EF of 25%.   [name] COPD (chronic obstructive pulmonary disease) ([place]) 11/2012   by xray  . Diverticulosis    by CT scan  . HCAP (healthcare-associated pneumonia) 11/06/2015  . Hypertension   . Insomnia   . Paroxysmal ventricular tachycardia ([place])   . Perennial allergic rhinitis    only to dust mites  . Pneumonia 2000s   "walking pneumonia"  . VT (ventricular tachycardia) ([place])    a. 05/2015 - VT storm with multiple ICD shocks-->Amio 400 BID.     Surgical History:  Past Surgical History:  Procedure Laterality Date  . CARDIAC CATHETERIZATION  2004   LAD 30%, D1 30%, CFX-AV groove 70-80%, OM1 30%, EF 20-25%  . CARDIAC CATHETERIZATION N/A 09/28/2015   Procedure: Left Heart Cath and Coronary Angiography;  Surgeon: Peter M Martinique, MD;  Location: [facility];  Service: Cardiovascular;  Laterality: N/A;  . CARDIAC DEFIBRILLATOR PLACEMENT  2004  . CATARACT EXTRACTION W/ INTRAOCULAR LENS IMPLANT Right 01/2012  . COLONOSCOPY  01/08/2012   Procedure: COLONOSCOPY;  Surgeon: Gatha Mayer, MD;  Location: [facility];  Service: Endoscopy;  Laterality: N/A;  . CORONARY ANGIOPLASTY  1995   Pt thinks he got a balloon, living in [place], [place] N/A 02/07/2012   Procedure: IMPLANTABLE CARDIOVERTER DEFIBRILLATOR GENERATOR CHANGE;  Surgeon: Evans Lance, MD; Medtronic Evera XT VR single-chamber serial number [id number], Laterality: Left  . INSERT / REPLACE / REMOVE PACEMAKER  2004   Medtronic ICD  .  KNEE ARTHROSCOPY Left 05/2003   Archie Endo 06/19/2010  . LAPAROSCOPIC CHOLECYSTECTOMY  1/ 2012  . SHOULDER ARTHROSCOPY W/ ROTATOR CUFF REPAIR Right twice  . TONSILLECTOMY AND ADENOIDECTOMY  ~ 1951  . V TACH ABLATION N/A 04/29/2016   Procedure: V Tach Ablation;  Surgeon: Evans Lance, MD;  Location: [facility];  Service: Cardiovascular;  Laterality: N/A;     Prescriptions Prior to Admission    Medication Sig Dispense Refill Last Dose  . acetaminophen (TYLENOL) 325 MG tablet Take 2 tablets (650 mg total) by mouth every 6 (six) hours as needed for mild pain (or Fever >/= 101).   few days ago  . apixaban (ELIQUIS) 2.5 MG TABS tablet Take 1 tablet (2.5 mg total) by mouth 2 (two) times daily. 180 tablet 3 07/07/2016 at 800  . benzonatate (TESSALON) 200 MG capsule Take 1 capsule (200 mg total) by mouth every 4 (four) hours as needed for cough. 20 capsule 0 several months ago  . Chlorpheniramine-APAP (CORICIDIN) 2-325 MG TABS Take 2 tablets by mouth daily as needed (congestion/runny nose).   07/06/2016 at pm  . furosemide (LASIX) 20 MG tablet Take 1 tablet (20 mg total) by mouth every Monday, Wednesday, and Friday. 45 tablet 3 07/05/2016 at am  . guaiFENesin (MUCINEX) 600 MG 12 hr tablet Take 600 mg by mouth 2 (two) times daily.    07/07/2016 at am  . loperamide (IMODIUM) 2 MG capsule Take 1 capsule (2 mg total) by mouth as needed for diarrhea or loose stools. (Patient taking differently: Take 4 mg by mouth See admin instructions. Take 2 capsules (4 mg) after each bowl movement.) 30 capsule 0 07/06/2016 at Unknown time  . metaxalone (SKELAXIN) 800 MG tablet Take 1 tablet (800 mg total) by mouth daily as needed for muscle spasms. (Patient taking differently: Take 400 mg by mouth 3 (three) times daily as needed for muscle spasms. ) 30 tablet 1 1-2 months ago  . mexiletine (MEXITIL) 150 MG capsule Take 2 capsules (300 mg total) by mouth every 8 (eight) hours. (Patient taking differently: Take 300 mg by mouth every 8 (eight) hours. 8am, 3:30pm, 11:30pm) 180 capsule 3 07/07/2016 at 1530  . nitroGLYCERIN (NITROSTAT) 0.4 MG SL tablet Place 1 tablet (0.4 mg total) under the tongue every 5 (five) minutes as needed for chest pain. 25 tablet 1 07/07/2016 at 820  . OXYGEN Inhale 2 L into the lungs continuous.   07/07/2016 at Unknown time  . promethazine (PHENERGAN) 25 MG tablet Take 25 mg by mouth every 6 (six) hours as  needed for nausea or vomiting.   few weeks ago  . RANEXA 500 MG 12 hr tablet TAKE 1 TABLET (500 MG TOTAL) BY MOUTH 2 (TWO) TIMES DAILY. 180 tablet 3 07/07/2016 at am  . ranitidine (ZANTAC) 150 MG capsule Take 150 mg by mouth daily as needed for heartburn.    few days ago  . Simethicone (GAS-X PO) Take 1-2 capsules by mouth daily as needed (pressure/bloating/gas). As needed.    few days ago  . sotalol (BETAPACE) 120 MG tablet Take 1 tablet (120 mg total) by mouth every 12 (twelve) hours. 60 tablet 3 07/07/2016 at 800  . traMADol (ULTRAM) 50 MG tablet Take 1 tablet (50 mg total) by mouth every 8 (eight) hours as needed (for pain). 100 tablet 1 few days ago  . zolpidem (AMBIEN) 10 MG tablet Take 5 mg by mouth at bedtime as needed for sleep.   07/06/2016 at pm  .  nitroGLYCERIN (NITROSTAT) 0.4 MG SL tablet PLACE 1 TABLET (0.4 MG TOTAL) UNDER THE TONGUE EVERY 5 (FIVE) MINUTES AS NEEDED. FOR CHEST PAIN. (Patient not taking: Reported on 07/07/2016) 25 tablet 1 Not Taking at Unknown time    Inpatient Medications:  . apixaban  5 mg Oral BID  . aspirin EC  81 mg Oral Daily  . famotidine  20 mg Oral Daily  . furosemide  20 mg Oral Q M,W,F  . mexiletine  300 mg Oral Q8H  . ranolazine  500 mg Oral BID  . sodium chloride flush  3 mL Intravenous Q12H    Allergies:  Allergies  Allergen Reactions  . Ace Inhibitors Other (See Comments)    muscle pain. Tolerates ARBs.   . Codeine Other (See Comments)    "head wants to explode."  . Doxycycline Diarrhea and Nausea And Vomiting  . Kionex [Sodium Polystyrene Sulfonate] Other (See Comments)    SOB, pressure in chest, weakness fatigue.   [name] Penicillins Swelling    "started at point of injection; w/in 3 min my upper arm was swollen 3 times normal" Has patient had a PCN reaction causing immediate rash, facial/tongue/throat swelling, SOB or lightheadedness with hypotension: Yes Has patient had a PCN reaction causing severe rash involving mucus membranes or skin necrosis:  No Has patient had a PCN reaction that required hospitalization No Has patient had a PCN reaction occurring within the last 10 years: No If all of the above answers are "NO", then may proceed wi  . Lisinopril Other (See Comments)    Muscle Pain  . Statins Other (See Comments)    Myalgias per patient  . Hydrocodone Other (See Comments)    Severe headache, esp when combined with skelaxin.    . Lidocaine     Hallucinations, jerking  . Pacerone [Amiodarone] Other (See Comments)    Lung and heart problem  . Xanax [Alprazolam] Other (See Comments)    Nightmares.      Social History   Social History  . Marital status: Married    Spouse name: N/A  . Number of children: 0  . Years of education: N/A   Occupational History  . [employer] truck driver (retired)    Social History Main Topics  . Smoking status: Former Smoker    Packs/day: 0.50    Years: 50.00    Types: Cigarettes, Cigars    Quit date: 09/27/2015  . Smokeless tobacco: Never Used  . Alcohol use No  . Drug use: No  . Sexual activity: Not Currently   Other Topics Concern  . Not on file   Social History Narrative   Lives with wife, married 1998   Grown children, 2 great grandchildren   Occupation: retired, was Probation officer   Activity: walking, fishing   Diet: good water daily, fruits/vegetables rare      Wife is Economist.    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 No known agent orange exposure.       Family History  Problem Relation Age of Onset  . Diabetes Father   . Tracheal cancer Father 57       smoker  . Stroke Mother   . Cancer Sister        left eye  . CAD Neg Hx   . Colon cancer Neg Hx   . Prostate cancer Neg Hx      Review of Systems: All other systems reviewed and are otherwise negative except as noted above.  Physical Exam: Vitals:   07/07/16  2027 07/08/16 0000 07/08/16 0359 07/08/16 0753  BP: 112/76 122/88 117/64 129/85  Pulse: 63 (!) 51 60 62  Resp: _0 Temp: 97.8 F (36.6 C) 97.9 F (36.6 C)  97.6 F (36.4 C) 98.6 F (37 C)  TempSrc: Oral Oral Oral Axillary  SpO2: 100% 98% 100% 95%  Weight:   150 lb 14.4 oz (68.4 kg)   Height:        GEN- The patient is chronically ill appearing, alert and oriented x 3 today.   HEENT: normocephalic, atraumatic; sclera clear, conjunctiva pink; hearing intact; oropharynx clear; neck supple  Lungs- Clear to ausculation bilaterally, normal work of breathing.  No wheezes, rales, rhonchi Heart- Regular rate and rhythm  GI- soft, non-tender, non-distended, bowel sounds present  Extremities- no clubbing, cyanosis, or edema  MS- no significant deformity or atrophy Skin- warm and dry, no rash or lesion Psych- euthymic mood, full affect Neuro- strength and sensation are intact  Labs:   Lab Results  Component Value Date   WBC 10.1 07/07/2016   HGB 10.4 (L) 07/07/2016   HCT 33.8 (L) 07/07/2016   MCV 78.6 07/07/2016   PLT 348 07/07/2016     Recent Labs Lab 07/07/16 0932  NA 139  K 4.4  CL 110  CO2 22  BUN 20  CREATININE 1.00  CALCIUM 8.7*  PROT 6.3*  BILITOT 0.3  ALKPHOS 73  ALT 9*  AST 17  GLUCOSE 89      Radiology/Studies: Dg Chest 2 View  Result Date: 06/19/2016 CLINICAL DATA:  ICD device deployed 3 times today. No chest complaints. EXAM: CHEST  2 VIEW COMPARISON:  04/25/2016 FINDINGS: Stable cardiomegaly with uncoiled tortuous appearance of the thoracic aorta. ICD device projects over the left mid anterior the chest wall with single right defibrillator lead in the right ventricle. No overt pulmonary edema, pneumonic consolidation, effusion or pneumothorax. No acute nor suspicious osseous lesions. Partially resected appearance of the distal right clavicle. IMPRESSION: No active cardiopulmonary disease. ICD device in place with stable cardiomegaly. Electronically Signed   By: Ashley Royalty M.D.   On: 06/19/2016 19:20   Dg Chest Portable 1 View  Result Date: 07/07/2016 CLINICAL DATA:  Chest pain, shortness of Breath EXAM: PORTABLE  CHEST 1 VIEW COMPARISON:  06/19/2016 FINDINGS: AICD remains in place, unchanged. Cardiomegaly. No confluent airspace opacities, effusions or edema. No acute bony abnormality. IMPRESSION: Cardiomegaly.  No active disease. Electronically Signed   By: Rolm Baptise M.D.   On: 07/07/2016 10:07    OPF:YTWKM rhythm, rate 64, 1st degree AV block (PR 264),  (personally reviewed), iRBBB  TELEMETRY: sinus rhythm (personally reviewed)  DEVICE HISTORY: MDT single chamber ICD implanted 2004; gen change 2014  Assessment/Plan: 1.  VT Recurrent despite multiple AAD's Options are extremely limited at this point.  Amio lung toxicity not clear in the past. With recurrent VT despite Sotalol and Mexiletine, will cautiously resume IV amiodarone today.  Dr Lovena Le discussed extensively with patient this morning who agrees Will check baseline ESR and follow serially over time Stop Sotalol Will need to watch conduction system closely with underlying conduction system disease Keep K>3.9, Mg >1.8 No driving  2.  Chronic systolic heart failure Likely nearing end stage Dr Lovena Le to discuss goals of care when wife is available for conversation (likely tomorrow) Continue current therapy  3.  CAD No recent ischemic symptoms No significant troponin elevation Do not think VT is related to ischemia No plans for ischemic eval at this  time  4.  Persistent atrial fibrillation Currently maintaining SR Resume Eliquis for CHADS2VASC of 4   Signed, Chanetta Marshall, NP 07/08/2016 8:49 AM  EP Attending Patient seen and examined. Agree with the plan as noted above. I have reviewed the findings as noted above. The patient presents with recurrent episodes of VT with ICD shocks. Exam reveals RRR with mostly clear lungs and no edema and normal neurological exam. Tele reveals sinus bradycardia A/P 1. Recurrent VT - at this point we will restart IV amiodarone. This will not be a long term solution. His prognosis is poor. 2.  Chronic systolic heart failure - his symptoms are mostly low output but class 2. 3. Sinus node dysfunction - his sotalol has been stopped. He is not on a beta blocker. 4. ICD - his device is working normally. Will follow.  Mikle Bosworth.D.

## 2016-07-09 NOTE — Progress Notes (Addendum)
Electrophysiology Rounding Note  Patient Name: [name] Date of Encounter: 07/09/2016  Electrophysiologist: Lovena Le   Subjective   The patient is ok today. No chest pain or shortness of breath. Asymptomatic with VT last night.   Inpatient Medications    Scheduled Meds: . apixaban  2.5 mg Oral BID  . aspirin EC  81 mg Oral Daily  . famotidine  20 mg Oral Daily  . furosemide  20 mg Oral Q M,W,F  . mexiletine  300 mg Oral Q8H  . ranolazine  500 mg Oral BID  . sodium chloride flush  3 mL Intravenous Q12H   Continuous Infusions: . sodium chloride    . sodium chloride 10 mL/hr at 07/09/16 0900  . amiodarone 30 mg/hr (07/09/16 0900)   PRN Meds: sodium chloride, acetaminophen, benzonatate, loperamide, metaxalone, nitroGLYCERIN, ondansetron (ZOFRAN) IV, simethicone, sodium chloride flush, traMADol, zolpidem   Vital Signs    Vitals:   07/08/16 2003 07/08/16 2155 07/08/16 2300 07/09/16 0355  BP: 121/69 129/87 128/73 101/60  Pulse: 61  (!) 58 63  Resp: 18 (!) 35 15 19  Temp: 97.9 F (36.6 C)  97.8 F (36.6 C) 98 F (36.7 C)  TempSrc: Oral  Oral Oral  SpO2: 99%  96% 94%  Weight:    148 lb 6.4 oz (67.3 kg)  Height:        Intake/Output Summary (Last 24 hours) at 07/09/16 1357 Last data filed at 07/09/16 0900  Gross per 24 hour  Intake             1042 ml  Output              300 ml  Net              742 ml   Filed Weights   07/07/16 1234 07/08/16 0359 07/09/16 0355  Weight: 149 lb 4.8 oz (67.7 kg) 150 lb 14.4 oz (68.4 kg) 148 lb 6.4 oz (67.3 kg)    Physical Exam    GEN- The patient is elderly and chronically ill appearing, alert and oriented x 3 today.   Head- normocephalic, atraumatic Eyes-  Sclera clear, conjunctiva pink Ears- hearing intact Oropharynx- clear Neck- supple Lungs- Clear to ausculation bilaterally, normal work of breathing Heart- Regular rate and rhythm GI- soft, NT, ND, + BS Extremities- no clubbing, cyanosis, or edema Skin- no rash  or lesion Psych- euthymic mood, full affect Neuro- strength and sensation are intact  Labs    CBC  Recent Labs  07/07/16 0932 07/07/16 1014  WBC 10.1  --   NEUTROABS 6.3  --   HGB 10.4* 11.2*  HCT 33.8* 33.0*  MCV 78.6  --   PLT 348  --    Basic Metabolic Panel  Recent Labs  07/07/16 0932 07/07/16 1014 07/08/16 2231  NA 139 142 137  K 4.4 4.4 3.7  CL 110 108 105  CO2 22  --  22  GLUCOSE 89 83 156*  BUN 20 22* 22*  CREATININE 1.00 1.10 1.22  CALCIUM 8.7*  --  8.4*  MG 2.0  --  1.9   Liver Function Tests  Recent Labs  07/07/16 0932  AST 17  ALT 9*  ALKPHOS 73  BILITOT 0.3  PROT 6.3*  ALBUMIN 3.3*   No results for input(s): LIPASE, AMYLASE in the last 72 hours. Cardiac Enzymes  Recent Labs  07/07/16 0932 07/07/16 1634 07/07/16 2244  TROPONINI 0.03* 0.03* 0.03*     Telemetry  Sinus brady with intermittent V pacing, runs of VT (3 different morphologies) overnight (personally reviewed)  Radiology    No results found.   Patient Profile     [name] is a 71 y.o. male admitted for recurrent VT with ICD shocks. He has been started on IV amiodarone.   Assessment & Plan    1.  VT Recurrent despite multiple AAD's Options are extremely limited at this point.  Will continue IV amiodarone today and follow.  Sed rate normal yesterday, will need to be followed serially. Will need to watch conduction system closely with underlying conduction system disease - no BB Keep K>3.9, Mg >1.8 No driving  2.  Chronic systolic heart failure Likely nearing end stage Continue current therapy Discussed palliative care today.  He and his wife are aware that we have limited options.   3.  CAD No recent ischemic symptoms No plans for ischemic eval at this time  4.  Persistent atrial fibrillation Currently maintaining SR Resume Eliquis for CHADS2VASC of 4  Signed, Chanetta Marshall, NP  07/09/2016, 1:57 PM   EP Attending  Patient seen and  examined. Agree with above. The patient has had more VT, but terminated with out ICD therapy. He has come off of sotalol and remains on IV amio. Exam is unchanged from yesterday. Tele reveals sinus bradycardia. CXR is without pulmonary edema. ESR is normal.  A/P 1. VT - will continue IV amiodarone. I have recommended he undergo more amiodarone. We will have to watch him very carefully for amio lung 2. Chronic systolic heart failure - he is near end stage. Will follow.  3. PAF - he has had no recurrent episodes. He will continue his anti-coagulation. 4. CAD - he denies anginal symptoms. Will follow. No change in meds.  Mikle Bosworth.D.

## 2016-07-09 NOTE — Discharge Instructions (Addendum)
No driving x6 months   Information on my medicine - ELIQUIS (apixaban)  This medication education was reviewed with me or my healthcare representative as part of my discharge preparation.  The pharmacist that spoke with me during my hospital stay was:  Dennie Fetters, [facility]  Why was Eliquis prescribed for you? Eliquis was prescribed for you to reduce the risk of a blood clot forming that can cause a stroke if you have a medical condition called atrial fibrillation (a type of irregular heartbeat).  What do You need to know about Eliquis ? Take your Eliquis TWICE DAILY - one tablet in the morning and one tablet in the evening with or without food. If you have difficulty swallowing the tablet whole please discuss with your pharmacist how to take the medication safely.  Take Eliquis exactly as prescribed by your doctor and DO NOT stop taking Eliquis without talking to the doctor who prescribed the medication.  Stopping may increase your risk of developing a stroke.  Refill your prescription before you run out.  After discharge, you should have regular check-up appointments with your healthcare provider that is prescribing your Eliquis.  In the future your dose may need to be changed if your kidney function or weight changes by a significant amount or as you get older.  What do you do if you miss a dose? If you miss a dose, take it as soon as you remember on the same day and resume taking twice daily.  Do not take more than one dose of ELIQUIS at the same time to make up a missed dose.  Important Safety Information A possible side effect of Eliquis is bleeding. You should call your healthcare provider right away if you experience any of the following: ? Bleeding from an injury or your nose that does not stop. ? Unusual colored urine (red or dark brown) or unusual colored stools (red or black). ? Unusual bruising for unknown reasons. ? A serious fall or if you hit your head (even if  there is no bleeding).  Some medicines may interact with Eliquis and might increase your risk of bleeding or clotting while on Eliquis. To help avoid this, consult your healthcare provider or pharmacist prior to using any new prescription or non-prescription medications, including herbals, vitamins, non-steroidal anti-inflammatory drugs (NSAIDs) and supplements.  This website has more information on Eliquis (apixaban): http://www.eliquis.com/eliquis/home

## 2016-07-09 NOTE — Care Management Note (Signed)
Case Management Note  Patient Details  Name: [name] MRN: [id number] Date of Birth: 25-May-1945  Subjective/Objective:  Pt presented for Defibrillator shock 2. Pt is from home with wife. PTA-Independent. Plan to return home once stable.                  Action/Plan: No needs identified by CM at this time.   Expected Discharge Date:                  Expected Discharge Plan:  Home/Self Care  In-House Referral:  NA  Discharge planning Services  CM Consult  Post Acute Care Choice:  NA Choice offered to:  NA  DME Arranged:  N/A DME Agency:  NA  HH Arranged:  NA HH Agency:  NA  Status of Service:  Completed, signed off  If discussed at Long Length of Stay Meetings, dates discussed:    Additional Comments:  Gala Lewandowsky, RN 07/09/2016, 2:33 PM

## 2016-07-10 LAB — MAGNESIUM: MAGNESIUM: 1.9 mg/dL (ref 1.7–2.4)

## 2016-07-10 LAB — POTASSIUM: Potassium: 4.4 mmol/L (ref 3.5–5.1)

## 2016-07-10 MED ORDER — BISACODYL 10 MG RE SUPP
10.0000 mg | Freq: Every day | RECTAL | Status: DC | PRN
  Administered 2016-07-10: 10 mg via RECTAL
  Filled 2016-07-10: qty 1

## 2016-07-10 NOTE — Progress Notes (Signed)
Covering Cardiology MD paged regarding patient's episodes of VTACH. Patient mostly maintaining in the 100-110's. Has been in the 50's up until episode. Patient on amio 30mg /hr. Patient asymptomatic, VSS. Orders received for STAT K and Mag, if K<4.0 or Mag <1.9 replacements to be ordered.

## 2016-07-10 NOTE — Progress Notes (Addendum)
Electrophysiology Rounding Note  Patient Name: [name] Date of Encounter: 07/10/2016  Electrophysiologist: Lovena Le   Subjective   The patient is ok today. No chest pain or shortness of breath. Asymptomatic with VT again last night at 105/min.   Inpatient Medications    Scheduled Meds: . apixaban  2.5 mg Oral BID  . aspirin EC  81 mg Oral Daily  . famotidine  20 mg Oral Daily  . furosemide  20 mg Oral Q M,W,F  . mexiletine  300 mg Oral Q8H  . ranolazine  500 mg Oral BID  . sodium chloride flush  3 mL Intravenous Q12H   Continuous Infusions: . sodium chloride    . sodium chloride 10 mL/hr at 07/09/16 0900  . amiodarone 30 mg/hr (07/10/16 0617)   PRN Meds: sodium chloride, acetaminophen, benzonatate, loperamide, metaxalone, nitroGLYCERIN, ondansetron (ZOFRAN) IV, simethicone, sodium chloride flush, traMADol, zolpidem   Vital Signs    Vitals:   07/09/16 2021 07/09/16 2300 07/10/16 0615 07/10/16 0731  BP: 108/72 (!) 134/92 116/75 115/77  Pulse: 62 65 68 (!) 50  Resp: _0 Temp: 97.8 F (36.6 C)  97.7 F (36.5 C) 97.8 F (36.6 C)  TempSrc: Oral  Oral Oral  SpO2: 92% 93% 98% 99%  Weight:   150 lb 4.8 oz (68.2 kg)   Height:        Intake/Output Summary (Last 24 hours) at 07/10/16 0751 Last data filed at 07/10/16 0617  Gross per 24 hour  Intake              725 ml  Output             1245 ml  Net             -520 ml   Filed Weights   07/08/16 0359 07/09/16 0355 07/10/16 0615  Weight: 150 lb 14.4 oz (68.4 kg) 148 lb 6.4 oz (67.3 kg) 150 lb 4.8 oz (68.2 kg)    Physical Exam    GEN- The patient is elderly and chronically ill appearing, alert and oriented x 3 today.   Head- normocephalic, atraumatic Eyes-  Sclera clear, conjunctiva pink Ears- hearing intact Oropharynx- clear Neck- supple Lungs- Clear to ausculation bilaterally, normal work of breathing Heart- Regular rate and rhythm GI- soft, NT, ND, + BS Extremities- no clubbing, cyanosis,  or edema Skin- no rash or lesion Psych- euthymic mood, full affect Neuro- strength and sensation are intact  Labs    CBC  Recent Labs  07/07/16 0932 07/07/16 1014  WBC 10.1  --   NEUTROABS 6.3  --   HGB 10.4* 11.2*  HCT 33.8* 33.0*  MCV 78.6  --   PLT 348  --    Basic Metabolic Panel  Recent Labs  07/07/16 0932 07/07/16 1014 07/08/16 2231 07/10/16 0043  NA 139 142 137  --   K 4.4 4.4 3.7 4.4  CL 110 108 105  --   CO2 22  --  22  --   GLUCOSE 89 83 156*  --   BUN 20 22* 22*  --   CREATININE 1.00 1.10 1.22  --   CALCIUM 8.7*  --  8.4*  --   MG 2.0  --  1.9 1.9   Liver Function Tests  Recent Labs  07/07/16 0932  AST 17  ALT 9*  ALKPHOS 73  BILITOT 0.3  PROT 6.3*  ALBUMIN 3.3*   No results for input(s): LIPASE, AMYLASE in the  last 72 hours. Cardiac Enzymes  Recent Labs  07/07/16 0932 07/07/16 1634 07/07/16 2244  TROPONINI 0.03* 0.03* 0.03*     Telemetry    Sinus brady with intermittent V pacing, sustained VT last night at around 100bpm (personally reviewed)  Radiology    No results found.   Patient Profile     [name] is a 71 y.o. male admitted for recurrent VT with ICD shocks. He has been started on IV amiodarone.   Assessment & Plan    1.  VT Recurrent despite multiple AAD's Options are extremely limited at this point.  Will continue IV amiodarone today with VT last night. If no VT today, plan to convert to po tomorrow and discharge with close outpatient follow up Sed rate normal yesterday, will need to be followed serially. Will need to watch conduction system closely with underlying conduction system disease - no BB Keep K>3.9, Mg >1.8 No driving  2.  Chronic systolic heart failure Likely nearing end stage Continue current therapy Goals of care discussions started  3.  CAD No recent ischemic symptoms No plans for ischemic eval at this time  4.  Persistent atrial fibrillation Currently maintaining SR Resume  Eliquis for CHADS2VASC of 4  Signed, Chanetta Marshall, NP  07/10/2016, 7:51 AM   EP Attending  Patient seen and examined. Agree with the findings as noted above with minimal modification. The patient had slow VT last night. He was asymptomatic. Exam reveals a RRR with clear lungs and no edema. Neuro is non-focal. Tele reveals mostly NSR and sinus brady with the VT as noted.  A/P 1. VT - has had a single 9 minute episode since we saw him yesterday and I have asked the patient to stay for another day of IV amiodarone.  2. Chronic systolic heart failure - he appears to be class 3A. He will continue his current meds. 3. CAD - he denies anginal symptoms. 4. Atrial fib - he is maintaining NSR very nicely. He will continue amio and Eliquis. 5. amio lung - this has resolved. His most recent sed rate was 13. I would invision following the ESR and use as a surrogate for lung toxicity.  Mikle Bosworth.D.

## 2016-07-10 NOTE — Plan of Care (Signed)
Problem: Pain Managment: Goal: General experience of comfort will improve Outcome: Progressing Patient has had no complaints of pain throughout shift.   Problem: Tissue Perfusion: Goal: Risk factors for ineffective tissue perfusion will decrease Outcome: Progressing Patient had one short episode of VTACH during shift. Converted back into the 50-60's by self. K and Mag were checked, did not fall below MD's parameters to supplement.

## 2016-07-11 MED ORDER — AMIODARONE HCL 200 MG PO TABS: 200.0000 mg | ORAL_TABLET | Freq: Two times a day (BID) | ORAL | 1 refills | Status: DC

## 2016-07-11 NOTE — Plan of Care (Signed)
Problem: Safety: Goal: Ability to remain free from injury will improve Outcome: Progressing Patient has remained free from injury.   Problem: Pain Managment: Goal: General experience of comfort will improve Outcome: Progressing No complaints of pain this shift.   Problem: Tissue Perfusion: Goal: Risk factors for ineffective tissue perfusion will decrease Outcome: Progressing Amiodarone drip maintained.

## 2016-07-11 NOTE — Care Management Important Message (Signed)
Important Message  Patient Details  Name: [name] MRN: [id number] Date of Birth: 1945-05-02   Medicare Important Message Given:  Yes    [name] 07/11/2016, 11:55 AM

## 2016-07-11 NOTE — Discharge Summary (Signed)
ELECTROPHYSIOLOGY PROCEDURE DISCHARGE SUMMARY    Patient ID: [name],  MRN: [id number], DOB/AGE: 1945/12/04 71 y.o.  Admit date: 07/07/2016 Discharge date: 07/11/2016  Primary Care Physician: Joaquim Nam, MD Electrophysiologist: [name]  Primary Discharge Diagnosis:  1.  VT with appropriate ICD therapy  Secondary Discharge Diagnosis: 1.  CAD 2.  ICM 3.  Persistent atrial fibrillation 4.  COPD 5.  Anxiety   Allergies  Allergen Reactions  . Ace Inhibitors Other (See Comments)    muscle pain. Tolerates ARBs.   . Codeine Other (See Comments)    "head wants to explode."  . Doxycycline Diarrhea and Nausea And Vomiting  . Kionex [Sodium Polystyrene Sulfonate] Other (See Comments)    SOB, pressure in chest, weakness fatigue.   [name] Penicillins Swelling    "started at point of injection; w/in 3 min my upper arm was swollen 3 times normal" Has patient had a PCN reaction causing immediate rash, facial/tongue/throat swelling, SOB or lightheadedness with hypotension: Yes Has patient had a PCN reaction causing severe rash involving mucus membranes or skin necrosis: No Has patient had a PCN reaction that required hospitalization No Has patient had a PCN reaction occurring within the last 10 years: No If all of the above answers are "NO", then may proceed wi  . Lisinopril Other (See Comments)    Muscle Pain  . Statins Other (See Comments)    Myalgias per patient  . Hydrocodone Other (See Comments)    Severe headache, esp when combined with skelaxin.    . Lidocaine     Hallucinations, jerking  . Pacerone [Amiodarone] Other (See Comments)    Lung and heart problem  . Xanax [Alprazolam] Other (See Comments)    Nightmares.      Brief HPI/Hospital Course:  [name] is a 71 y.o. male with past medical history significant for CAD/ICM, persistent atrial fibrillation, COPD, anxiety, and possible amiodarone toxicity. He has a long VT history and has previously been on  Mexiletine, Ranexa, amiodarone (discontinued 2/2 concerns for lung toxicity), hallucinations with lidocaine, GI intolerance to quinidine. Prior VT ablation demonstrated multiple VT's with source close to His Bundle. He was most recently discharged after appropriate ICD therapy 06/22/16 with recurrent VT and was started on Sotalol at that time. He had appropriate ICD therapy again the day prior to admission and presented to the ER for evaluation.  Device interrogation showed appropriate therapy for VT.  He reports compliance with Sotalol and Mexiletine since discharge. He was seen by Dr [name]. He has very limited options for treatment of VT. After a long discussion with Dr [name], it was elected to stop Sotalol and start amiodarone cautiously and follow sed rates serially. He was maintained on IV amiodarone during hospitalization with transition to 200mg  twice daily at discharge.  He has been advised that he should not drive. He will have close outpatient follow up with Dr [name] next week. He has been seen and examined by Dr [name] this morning and considered stable for discharge to home.   Physical Exam: Vitals:   07/10/16 2058 07/10/16 2354 07/11/16 0500 07/11/16 0814  BP: 123/70 111/65 106/80 112/74  Pulse: 66 (!) 59 64 (!) 54  Resp: 20 18 (!) 23 15  Temp: 98 F (36.7 C) 97.9 F (36.6 C) 97.9 F (36.6 C) 97.9 F (36.6 C)  TempSrc: Oral Oral Oral Oral  SpO2: 100% 100% 95% 98%  Weight:   150 lb 6.4 oz (68.2 kg)  Height:        GEN- The patient is elderly and chronically ill appearing, alert and oriented x 3 today.   HEENT: normocephalic, atraumatic; sclera clear, conjunctiva pink; hearing intact; oropharynx clear; neck supple  Lungs- Clear to ausculation bilaterally, normal work of breathing.  No wheezes, rales, rhonchi Heart- Regular rate and rhythm  GI- soft, non-tender, non-distended, bowel sounds present  Extremities- no clubbing, cyanosis, or edema  MS- no significant deformity or  atrophy Skin- warm and dry, no rash or lesion Psych- euthymic mood, full affect Neuro- strength and sensation are intact    Labs:   Lab Results  Component Value Date   WBC 10.1 07/07/2016   HGB 11.2 (L) 07/07/2016   HCT 33.0 (L) 07/07/2016   MCV 78.6 07/07/2016   PLT 348 07/07/2016    Recent Labs Lab 07/07/16 0932  07/08/16 2231 07/10/16 0043  NA 139  < > 137  --   K 4.4  < > 3.7 4.4  CL 110  < > 105  --   CO2 22  --  22  --   BUN 20  < > 22*  --   CREATININE 1.00  < > 1.22  --   CALCIUM 8.7*  --  8.4*  --   PROT 6.3*  --   --   --   BILITOT 0.3  --   --   --   ALKPHOS 73  --   --   --   ALT 9*  --   --   --   AST 17  --   --   --   GLUCOSE 89  < > 156*  --   < > = values in this interval not displayed.   Discharge Medications:  Current Discharge Medication List    START taking these medications   Details  amiodarone (PACERONE) 200 MG tablet Take 1 tablet (200 mg total) by mouth 2 (two) times daily. Qty: 180 tablet, Refills: 1      CONTINUE these medications which have NOT CHANGED   Details  acetaminophen (TYLENOL) 325 MG tablet Take 2 tablets (650 mg total) by mouth every 6 (six) hours as needed for mild pain (or Fever >/= 101).    apixaban (ELIQUIS) 2.5 MG TABS tablet Take 1 tablet (2.5 mg total) by mouth 2 (two) times daily. Qty: 180 tablet, Refills: 3    benzonatate (TESSALON) 200 MG capsule Take 1 capsule (200 mg total) by mouth every 4 (four) hours as needed for cough. Qty: 20 capsule, Refills: 0    Chlorpheniramine-APAP (CORICIDIN) 2-325 MG TABS Take 2 tablets by mouth daily as needed (congestion/runny nose).    furosemide (LASIX) 20 MG tablet Take 1 tablet (20 mg total) by mouth every Monday, Wednesday, and Friday. Qty: 45 tablet, Refills: 3    guaiFENesin (MUCINEX) 600 MG 12 hr tablet Take 600 mg by mouth 2 (two) times daily.     loperamide (IMODIUM) 2 MG capsule Take 1 capsule (2 mg total) by mouth as needed for diarrhea or loose stools. Qty:  30 capsule, Refills: 0    metaxalone (SKELAXIN) 800 MG tablet Take 1 tablet (800 mg total) by mouth daily as needed for muscle spasms. Qty: 30 tablet, Refills: 1    mexiletine (MEXITIL) 150 MG capsule Take 2 capsules (300 mg total) by mouth every 8 (eight) hours. Qty: 180 capsule, Refills: 3    !! nitroGLYCERIN (NITROSTAT) 0.4 MG SL tablet Place 1 tablet (0.4 mg total)  under the tongue every 5 (five) minutes as needed for chest pain. Qty: 25 tablet, Refills: 1    OXYGEN Inhale 2 L into the lungs continuous.    promethazine (PHENERGAN) 25 MG tablet Take 25 mg by mouth every 6 (six) hours as needed for nausea or vomiting.    RANEXA 500 MG 12 hr tablet TAKE 1 TABLET (500 MG TOTAL) BY MOUTH 2 (TWO) TIMES DAILY. Qty: 180 tablet, Refills: 3    ranitidine (ZANTAC) 150 MG capsule Take 150 mg by mouth daily as needed for heartburn.     Simethicone (GAS-X PO) Take 1-2 capsules by mouth daily as needed (pressure/bloating/gas). As needed.     traMADol (ULTRAM) 50 MG tablet Take 1 tablet (50 mg total) by mouth every 8 (eight) hours as needed (for pain). Qty: 100 tablet, Refills: 1    zolpidem (AMBIEN) 10 MG tablet Take 5 mg by mouth at bedtime as needed for sleep.    !! nitroGLYCERIN (NITROSTAT) 0.4 MG SL tablet PLACE 1 TABLET (0.4 MG TOTAL) UNDER THE TONGUE EVERY 5 (FIVE) MINUTES AS NEEDED. FOR CHEST PAIN. Qty: 25 tablet, Refills: 1     !! - Potential duplicate medications found. Please discuss with provider.    STOP taking these medications     sotalol (BETAPACE) 120 MG tablet         Disposition: Pt is being discharged home today in good condition. Discharge Instructions    Diet - low sodium heart healthy    Complete by:  As directed    Increase activity slowly    Complete by:  As directed      Follow-up Information    Marinus Maw, MD Follow up on 07/15/2016.   Specialty:  Cardiology Why:  at 3:30PM Contact information: [address] [place]  [address] [phone]           Duration of Discharge Encounter: Greater than 30 minutes including physician time.  Signed, Gypsy Balsam, NP 07/11/2016 8:20 AM  EP Attending  Patient seen and examined. Agree with above. He has had no recurrent VT over night and he is stable this morning. He will be discharged home on amio 200 bid with careful followup. This is a palliative measure at this point. We will see him back in a few weeks.   Leonia Reeves.D.

## 2016-07-12 ENCOUNTER — Telehealth: Payer: Self-pay | Admitting: Internal Medicine

## 2016-07-12 ENCOUNTER — Other Ambulatory Visit: Payer: Medicare Other

## 2016-07-12 ENCOUNTER — Encounter: Payer: Self-pay | Admitting: Cardiology

## 2016-07-12 NOTE — Telephone Encounter (Signed)
Returned page from patient. Got shocked by his device. Has been "feeling unwell" on and off all day today. Has had some symptoms of chest discomfort and "indigestion." States that he has gotten this intermittently chronically. Recently discharged from hospital after ICD shocks. Per discharge summary, has been on multiple antiarrhythmic therapies that have been discontinued due to toxicity or lack of efficacy. Amiodarone restarted during hospitalization, discharged on 200mg  BID.  Given patient's chest discomfort and ICD discharge, recommended that he comes to the ED for evaluation. Patient however now states that he is "feeling fine" and would prefer not to come to hospital. States that he wants to see if he gets shocked again, at which time he will come to the ED. I instructed him that my recommendation is that he comes for evaluation now, however he again stated a preference to avoid this.   It appears as though he received about 48 hours of IV amiodarone during his hospitalization, totaling about 2 grams. He was then discharged on 200mg  BID PO, which he took today. I recommended that he takes another dose of 200mg  x 1 tonight and that tomorrow he starts taking 400mg  BID PO. He has an appointment with Dr. [name] on Monday afternoon and will continue taking 400mg  BID until this time unless contacted by Dr. [name] and told otherwise. The patient agrees to come to the ED for evaluation if he has another episode of chest discomfort or if he gets shocked again.  This message will be cc'd to Dr. [name].   Rosario Jacks, MD Cardiology Fellow

## 2016-07-15 ENCOUNTER — Ambulatory Visit (INDEPENDENT_AMBULATORY_CARE_PROVIDER_SITE_OTHER): Payer: Medicare Other | Admitting: Internal Medicine

## 2016-07-15 ENCOUNTER — Encounter: Payer: Self-pay | Admitting: Internal Medicine

## 2016-07-15 DIAGNOSIS — I5022 Chronic systolic (congestive) heart failure: Secondary | ICD-10-CM

## 2016-07-15 DIAGNOSIS — I255 Ischemic cardiomyopathy: Secondary | ICD-10-CM

## 2016-07-15 MED ORDER — ISOSORBIDE MONONITRATE ER 30 MG PO TB24: 15.0000 mg | ORAL_TABLET | Freq: Two times a day (BID) | ORAL | 5 refills | Status: DC

## 2016-07-15 NOTE — Progress Notes (Signed)
HPI [name] returns today for followup. He is a 71 year old man with an ischemic cardiomyopathy, chronic systolic heart failure, class III, persistent atrial fibrillation, on Eliquis, ventricular tachycardia, status post ICD implantation, s/p recent ablation. Unfortunately he has continued to have VT. He was in the hospital last week and was treated with more amiodarone. He is also on mexitil. He received an ICD shock the day after DC. He has chronic class 3 dyspnea and has been using oxygen.  He asks me if he would ever be a heart transplant candidate. Allergies  Allergen Reactions  . Ace Inhibitors Other (See Comments)    muscle pain. Tolerates ARBs.   . Codeine Other (See Comments)    "head wants to explode."  . Doxycycline Diarrhea and Nausea And Vomiting  . Kionex [Sodium Polystyrene Sulfonate] Other (See Comments)    SOB, pressure in chest, weakness fatigue.   [name] Penicillins Swelling    "started at point of injection; w/in 3 min my upper arm was swollen 3 times normal" Has patient had a PCN reaction causing immediate rash, facial/tongue/throat swelling, SOB or lightheadedness with hypotension: Yes Has patient had a PCN reaction causing severe rash involving mucus membranes or skin necrosis: No Has patient had a PCN reaction that required hospitalization No Has patient had a PCN reaction occurring within the last 10 years: No If all of the above answers are "NO", then may proceed wi  . Lisinopril Other (See Comments)    Muscle Pain  . Statins Other (See Comments)    Myalgias per patient  . Hydrocodone Other (See Comments)    Severe headache, esp when combined with skelaxin.    . Lidocaine     Hallucinations, jerking  . Pacerone [Amiodarone] Other (See Comments)    Lung and heart problem  . Xanax [Alprazolam] Other (See Comments)    Nightmares.       Current Outpatient Prescriptions  Medication Sig Dispense Refill  . acetaminophen (TYLENOL) 325 MG tablet Take 2 tablets (650  mg total) by mouth every 6 (six) hours as needed for mild pain (or Fever >/= 101).    [name] amiodarone (PACERONE) 200 MG tablet Take 1 tablet (200 mg total) by mouth 2 (two) times daily. 180 tablet 1  . apixaban (ELIQUIS) 2.5 MG TABS tablet Take 1 tablet (2.5 mg total) by mouth 2 (two) times daily. 180 tablet 3  . benzonatate (TESSALON) 200 MG capsule Take 1 capsule (200 mg total) by mouth every 4 (four) hours as needed for cough. 20 capsule 0  . Chlorpheniramine-APAP (CORICIDIN) 2-325 MG TABS Take 2 tablets by mouth daily as needed (congestion/runny nose).    . furosemide (LASIX) 20 MG tablet Take 1 tablet (20 mg total) by mouth every Monday, Wednesday, and Friday. 45 tablet 3  . guaiFENesin (MUCINEX) 600 MG 12 hr tablet Take 600 mg by mouth 2 (two) times daily.     [name] loperamide (IMODIUM) 2 MG capsule Take 1 capsule (2 mg total) by mouth as needed for diarrhea or loose stools. (Patient taking differently: Take 4 mg by mouth See admin instructions. Take 2 capsules (4 mg) after each bowl movement.) 30 capsule 0  . metaxalone (SKELAXIN) 800 MG tablet Take 1 tablet (800 mg total) by mouth daily as needed for muscle spasms. (Patient taking differently: Take 400 mg by mouth 3 (three) times daily as needed for muscle spasms. ) 30 tablet 1  . mexiletine (MEXITIL) 150 MG capsule Take 2 capsules (300 mg total) by mouth  every 8 (eight) hours. (Patient taking differently: Take 300 mg by mouth every 8 (eight) hours. 8am, 3:30pm, 11:30pm) 180 capsule 3  . nitroGLYCERIN (NITROSTAT) 0.4 MG SL tablet Place 1 tablet (0.4 mg total) under the tongue every 5 (five) minutes as needed for chest pain. 25 tablet 1  . nitroGLYCERIN (NITROSTAT) 0.4 MG SL tablet PLACE 1 TABLET (0.4 MG TOTAL) UNDER THE TONGUE EVERY 5 (FIVE) MINUTES AS NEEDED. FOR CHEST PAIN. 25 tablet 1  . OXYGEN Inhale 2 L into the lungs continuous.    . promethazine (PHENERGAN) 25 MG tablet Take 25 mg by mouth every 6 (six) hours as needed for nausea or vomiting.     [name] RANEXA 500 MG 12 hr tablet TAKE 1 TABLET (500 MG TOTAL) BY MOUTH 2 (TWO) TIMES DAILY. 180 tablet 3  . ranitidine (ZANTAC) 150 MG capsule Take 150 mg by mouth daily as needed for heartburn.     . Simethicone (GAS-X PO) Take 1-2 capsules by mouth daily as needed (pressure/bloating/gas). As needed.     . traMADol (ULTRAM) 50 MG tablet Take 1 tablet (50 mg total) by mouth every 8 (eight) hours as needed (for pain). 100 tablet 1  . zolpidem (AMBIEN) 10 MG tablet Take 5 mg by mouth at bedtime as needed for sleep.    . isosorbide mononitrate (IMDUR) 30 MG 24 hr tablet Take 0.5 tablets (15 mg total) by mouth 2 (two) times daily. 60 tablet 5   No current facility-administered medications for this visit.    Facility-Administered Medications Ordered in Other Visits  Medication Dose Route Frequency Provider Last Rate Last Dose  . sodium chloride 0.9 % injection 3 mL  3 mL Intravenous Q12H Randy Lance, [name]      . sodium chloride 0.9 % injection 3 mL  3 mL Intravenous PRN Randy Lance, [name]         Past Medical History:  Diagnosis Date  . Acute lower GI bleeding 12/11/2011   "first time" (12/11/2011)  . Acute myocardial infarction, unspecified site, episode of care unspecified 1995   Pt living in [place], no stent, ?PTCA  . AICD (automatic cardioverter/defibrillator) present   . Allergic rhinitis, cause unspecified   . Anxiety   . Arthritis    "all over" (11/07/2015)  . Atrial fibrillation ([place])    a. 05/2015 - converted to sinus in setting of ICD shocks; placed on eliquis 5 bid.  [name] CAD (coronary artery disease), autologous vein bypass graft   . Cervical herniated disc    told not to lift >10 lbs  . Chronic systolic CHF (congestive heart failure), NYHA class 2 (HCC)    Reports EF of 25%.   [name] COPD (chronic obstructive pulmonary disease) ([place]) 11/2012   by xray  . Diverticulosis    by CT scan  . HCAP (healthcare-associated pneumonia) 11/06/2015  . Hypertension   . Insomnia   . Paroxysmal  ventricular tachycardia ([place])   . Perennial allergic rhinitis    only to dust mites  . Pneumonia 2000s   "walking pneumonia"  . VT (ventricular tachycardia) ([place])    a. 05/2015 - VT storm with multiple ICD shocks-->Amio 400 BID.    ROS:   All systems reviewed and negative except as noted in the HPI.   Past Surgical History:  Procedure Laterality Date  . CARDIAC CATHETERIZATION  2004   LAD 30%, D1 30%, CFX-AV groove 70-80%, OM1 30%, EF 20-25%  . CARDIAC CATHETERIZATION N/A 09/28/2015   Procedure: Left  Heart Cath and Coronary Angiography;  Surgeon: Peter M Martinique, [name];  Location: [facility];  Service: Cardiovascular;  Laterality: N/A;  . CARDIAC DEFIBRILLATOR PLACEMENT  2004  . CATARACT EXTRACTION W/ INTRAOCULAR LENS IMPLANT Right 01/2012  . COLONOSCOPY  01/08/2012   Procedure: COLONOSCOPY;  Surgeon: Gatha Mayer, [name];  Location: [facility];  Service: Endoscopy;  Laterality: N/A;  . CORONARY ANGIOPLASTY  1995   Pt thinks he got a balloon, living in [place], [place] N/A 02/07/2012   Procedure: IMPLANTABLE CARDIOVERTER DEFIBRILLATOR GENERATOR CHANGE;  Surgeon: Randy Lance, [name]; Medtronic Evera XT VR single-chamber serial number [id number], Laterality: Left  . INSERT / REPLACE / REMOVE PACEMAKER  2004   Medtronic ICD  . KNEE ARTHROSCOPY Left 05/2003   Archie Endo 06/19/2010  . LAPAROSCOPIC CHOLECYSTECTOMY  1/ 2012  . SHOULDER ARTHROSCOPY W/ ROTATOR CUFF REPAIR Right twice  . TONSILLECTOMY AND ADENOIDECTOMY  ~ 1951  . V TACH ABLATION N/A 04/29/2016   Procedure: V Tach Ablation;  Surgeon: Randy Lance, [name];  Location: [facility];  Service: Cardiovascular;  Laterality: N/A;     Family History  Problem Relation Age of Onset  . Diabetes Father   . Tracheal cancer Father 30       smoker  . Stroke Mother   . Cancer Sister        left eye  . CAD Neg Hx   . Colon cancer Neg Hx   . Prostate cancer Neg Hx       Social History   Social History  . Marital status: Married    Spouse name: N/A  . Number of children: 0  . Years of education: N/A   Occupational History  . [employer] truck driver (retired)    Social History Main Topics  . Smoking status: Former Smoker    Packs/day: 0.50    Years: 50.00    Types: Cigarettes, Cigars    Quit date: 09/27/2015  . Smokeless tobacco: Never Used  . Alcohol use No  . Drug use: No  . Sexual activity: Not Currently   Other Topics Concern  . Not on file   Social History Narrative   Lives with wife, married 1998   Grown children, 2 great grandchildren   Occupation: retired, was Probation officer   Activity: walking, fishing   Diet: good water daily, fruits/vegetables rare      Wife is Economist.    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 No known agent orange exposure.       BP 140/78   Pulse 68   Ht 5' 3" (1.6 m)   Wt 153 lb (69.4 kg)   SpO2 96%   BMI 27.10 kg/m   Physical Exam:  Chronically ill appearing 71 yo man, NAD HEENT: Unremarkable Neck:  6 cm JVD, no thyromegally Lungs:  Clear except for rales in the bases. No wheezes or rhonchi. Well-healed ICD incision. HEART:  Regular brady rhythm, no murmurs, no rubs, no clicks Abd:  soft, positive bowel sounds, no organomegally, no rebound, no guarding Ext:  2 plus pulses, no edema, no cyanosis, no clubbing Skin:  No rashes no nodules Neuro:  CN II through XII intact, motor grossly intact  ECG - none today  DEVICE  Normal device function.  See PaceArt for details. Optivol is stable. VT starting at 110/min before accelerating to 195/min  Assess/Plan:  1. VT - he has restarted amiodarone in a last ditch effort to  control his VT. We will follow his ESR. 2. Chronic systolic heart failure - his symptoms are controlled. No change in meds. He is on oxygen therapy. I will review with Dr. Reine [name] regarding tx candidacy 3. ICD - his Medtronic device is working normally. Will recheck in several months.  4. Atrial fib  - he is at risk for stroke. He is tolerating his Eliquis for thromboembolic prevention but is having lots of skin bleeding and insists on his dose being reduced to 2.5 mg twice daily.  Randy Bosworth.D.

## 2016-07-15 NOTE — Patient Instructions (Addendum)
Medication Instructions:  Your physician has recommended you make the following change in your medication:  START Imdur 15 mg (1/2 tablet) twice a day  Labwork: None Ordered   Testing/Procedures: None Ordered   Follow-Up: Your physician recommends that you schedule a follow-up appointment in: 6 weeks with Dr. [name]     Any Other Special Instructions Will Be Listed Below (If Applicable).     If you need a refill on your cardiac medications before your next appointment, please call your pharmacy.

## 2016-07-16 ENCOUNTER — Other Ambulatory Visit: Payer: Self-pay | Admitting: Internal Medicine

## 2016-07-16 LAB — CUP PACEART INCLINIC DEVICE CHECK
Battery Voltage: 2.9 V
Brady Statistic RV Percent Paced: 0.86 %
Date Time Interrogation Session: 20180611190115
HighPow Impedance: 46 Ohm
HighPow Impedance: 55 Ohm
Implantable Lead Location: 753860
Implantable Pulse Generator Implant Date: 20140103
Lead Channel Impedance Value: 380 Ohm
Lead Channel Impedance Value: 437 Ohm
Lead Channel Pacing Threshold Pulse Width: 0.4 ms
Lead Channel Sensing Intrinsic Amplitude: 2.5 mV
Lead Channel Sensing Intrinsic Amplitude: 3.125 mV
Lead Channel Setting Pacing Amplitude: 2.5 V
Lead Channel Setting Sensing Sensitivity: 0.3 mV
MDC IDC LEAD IMPLANT DT: 20040722
MDC IDC MSMT BATTERY REMAINING LONGEVITY: 32 mo
MDC IDC MSMT LEADCHNL RV PACING THRESHOLD AMPLITUDE: 0.75 V
MDC IDC SET LEADCHNL RV PACING PULSEWIDTH: 0.4 ms

## 2016-07-16 MED ORDER — ISOSORBIDE MONONITRATE ER 30 MG PO TB24: 15.0000 mg | ORAL_TABLET | Freq: Two times a day (BID) | ORAL | 5 refills | Status: DC

## 2016-07-16 NOTE — Telephone Encounter (Signed)
Called pt and left message informing pt that his medication isosorbide mononitrate 30 mg tablet  was resent [facility] [place], [place] and if he has any other problems, questions or concerns to call our office.

## 2016-07-20 DIAGNOSIS — R0602 Shortness of breath: Secondary | ICD-10-CM | POA: Diagnosis not present

## 2016-07-22 ENCOUNTER — Telehealth: Payer: Self-pay | Admitting: Internal Medicine

## 2016-07-22 ENCOUNTER — Telehealth: Payer: Self-pay | Admitting: Cardiology

## 2016-07-22 ENCOUNTER — Telehealth: Payer: Self-pay

## 2016-07-22 MED ORDER — AMIODARONE HCL 200 MG PO TABS: 200.0000 mg | ORAL_TABLET | Freq: Every day | ORAL | 3 refills | Status: DC

## 2016-07-22 NOTE — Telephone Encounter (Addendum)
Pt states that he has having an allergic reaction to Amiodarone 200 mg . Pt is taking this medication for two weeks. Pt took this medication at 0800 this AM. Pt states has been having weakness and off balance for the last  3 days.These symptoms do not happened all the time just on and off. Pt got upset when I was trying of find information to take to MD. Pt is aware that I will let Dr. [name] know about his symptoms. Dr Elease Hashimoto DOD ware of pt's symptoms. He recommends to have this message send   to Dr. [name] to review and make recommendations.

## 2016-07-22 NOTE — Telephone Encounter (Signed)
Please note that patient has been getting Nitrostat filled extremely frequent.  Pharmacy sent [place] a fax request today and upon further investigation it is noted that [facility] [place] [facility] patient has had the following fills of Nitrostat in the past few weeks:  1.  #25 on 07/04/16 2.  #25 on 07/15/16 3.  #25 on 07/20/16   *Cardiology office is currently managing this r/x.  Appears patient has a lot going on right now, will forward on to them to review appropriateness of this with patient and to see how he is doing.  Patient is currently requesting [facility] supply [facility] [address]  Thanks.

## 2016-07-22 NOTE — Telephone Encounter (Addendum)
I need cardiology input on this, routed. Thanks.

## 2016-07-22 NOTE — Telephone Encounter (Signed)
New Message     Pt c/o medication issue:  1. Name of Medication:  amiodarone (PACERONE) 200 MG tablet Take 1 tablet (200 mg total) by mouth 2 (two) times daily     2. How are you currently taking this medication (dosage and times per day)?  1tab 2x a day  3. Are you having a reaction (difficulty breathing--STAT)?   A little sob -not all the time   4. What is your medication issue?  Medication is throwing off his balance

## 2016-07-22 NOTE — Telephone Encounter (Signed)
Called home phone number [phone]), pt unavailable. Called pt's wife, Hilda Lias (on [place]) - wife unavailable. left detailed message on wife's phone. Informed Dr. [name] recommended pt to reduce dose of Amiodarone to 200 mg once daily. Requested call back to discuss.

## 2016-07-23 ENCOUNTER — Telehealth: Payer: Self-pay

## 2016-07-23 NOTE — Telephone Encounter (Signed)
Pt left a VM states you prescribed a rescue inhaler a while back and he didn't need it then but now he would like one [facility] ot [facility] in [place].

## 2016-07-24 MED ORDER — ALBUTEROL SULFATE HFA 108 (90 BASE) MCG/ACT IN AERS: 1.0000 | INHALATION_SPRAY | Freq: Four times a day (QID) | RESPIRATORY_TRACT | 0 refills | Status: DC | PRN

## 2016-07-24 NOTE — Telephone Encounter (Signed)
Sent.  Thanks.  If not helpful, then let me know.

## 2016-07-24 NOTE — Telephone Encounter (Signed)
Spoke to pt and advised Rx sent to pharmacy; pt advised to contact office if ineffective

## 2016-07-25 NOTE — Telephone Encounter (Signed)
error 

## 2016-07-26 ENCOUNTER — Encounter: Payer: Medicare Other | Admitting: Physician Assistant

## 2016-07-26 NOTE — Telephone Encounter (Signed)
I would like for him to stay on amio 200 mg daily. GT

## 2016-07-28 NOTE — Telephone Encounter (Signed)
It doesn't appear that this is my patient

## 2016-07-30 NOTE — Telephone Encounter (Signed)
Please advise 

## 2016-07-30 NOTE — Telephone Encounter (Signed)
Pt of Dr. [name].

## 2016-08-08 ENCOUNTER — Telehealth: Payer: Self-pay | Admitting: Family Medicine

## 2016-08-08 NOTE — Telephone Encounter (Signed)
[facility] Day - Client TELEPHONE ADVICE RECORD [facility] Patient Name: [name] DOB: [birth year] Initial Comment Caller states his legs are restless. He is wondering if he can take iron for this. He has had this for awhile but its been increasing lately Nurse Assessment Nurse: Alvera Singh, RN, Bonita Quin Date/Time (Eastern Time): 08/08/2016 4:22:26 PM Confirm and document reason for call. If symptomatic, describe symptoms. ---Caller is having tingling and cramping in his legs when he lays down. Caller wants to know what do. Does the patient have any new or worsening symptoms? ---Yes Will a triage be completed? ---Yes Related visit to physician within the last 2 weeks? ---No Does the PT have any chronic conditions? (i.e. diabetes, asthma, etc.) ---Yes List chronic conditions. ---CHF Is this a behavioral health or substance abuse call? ---No Guidelines Guideline Title Affirmed Question Affirmed Notes Leg Pain [1] MODERATE pain (e.g., interferes with normal activities, limping) AND [2] present > 3 days Final Disposition User See PCP When Office is Open (within 3 days) Alvera Singh, RN, Bonita Quin Comments has to move legs all the time. Legs keep him awake at night. Uses oxygen 2L BNC for COPD Nurse to call patient back with an appointment. No appts available Friday with Dr. Para March. Patient prefers to see Dr. Para March. Instructed not to take NSAIDS Patient does not want to make an appointment at the [facility] office, says he will wait to make an appointment with DR. Para March Referrals REFERRED TO PCP OFFICEDisagree/Comply: Comply

## 2016-08-09 ENCOUNTER — Telehealth: Payer: Self-pay

## 2016-08-09 NOTE — Telephone Encounter (Signed)
Pt has appt with Dr Para March on 08/12/16 @ 3:30.

## 2016-08-09 NOTE — Telephone Encounter (Signed)
Noted, OV would be best.  We likely need to get labs done at the visit, he doesn't need to fast.  Thanks

## 2016-08-09 NOTE — Telephone Encounter (Signed)
Insurance phoned and approved thru August 09, 2017.  Pharmacy advised.  Approval letter to follow by fax.

## 2016-08-09 NOTE — Telephone Encounter (Signed)
Dolly with [facility] [place] left v/m; pt having problem getting zolpidem 10 mg; ins does not want to pay for med and request call to [phone]; Steele Sizer has also sent electronic request .

## 2016-08-12 ENCOUNTER — Ambulatory Visit (INDEPENDENT_AMBULATORY_CARE_PROVIDER_SITE_OTHER): Payer: Medicare Other | Admitting: Family Medicine

## 2016-08-12 ENCOUNTER — Encounter: Payer: Self-pay | Admitting: Family Medicine

## 2016-08-12 VITALS — BP 102/60 | HR 70 | Temp 97.8°F | Wt 154.0 lb

## 2016-08-12 DIAGNOSIS — I255 Ischemic cardiomyopathy: Secondary | ICD-10-CM | POA: Diagnosis not present

## 2016-08-12 DIAGNOSIS — G2581 Restless legs syndrome: Secondary | ICD-10-CM | POA: Diagnosis not present

## 2016-08-12 DIAGNOSIS — D649 Anemia, unspecified: Secondary | ICD-10-CM

## 2016-08-12 DIAGNOSIS — G4700 Insomnia, unspecified: Secondary | ICD-10-CM

## 2016-08-12 MED ORDER — MEXILETINE HCL 150 MG PO CAPS: 300.0000 mg | ORAL_CAPSULE | Freq: Three times a day (TID) | ORAL | Status: DC

## 2016-08-12 MED ORDER — METAXALONE 800 MG PO TABS: 400.0000 mg | ORAL_TABLET | Freq: Three times a day (TID) | ORAL | Status: DC | PRN

## 2016-08-12 MED ORDER — ZOLPIDEM TARTRATE 10 MG PO TABS: 5.0000 mg | ORAL_TABLET | Freq: Every evening | ORAL | 5 refills | Status: DC | PRN

## 2016-08-12 MED ORDER — LOPERAMIDE HCL 2 MG PO CAPS: 4.0000 mg | ORAL_CAPSULE | ORAL | Status: DC | PRN

## 2016-08-12 NOTE — Progress Notes (Signed)
Insomnia.  Some relief with ambien w/o ADE.  Needed refill.  RLS, "creepy crawly."  Worse in the last few months.  Comes and goes.  Not worse at night.  Still anticoagulated.  No known blood loss.  Limb movement noted when patient is asleep, legs not arms.   Can wake up from sx.    Meds, vitals, and allergies reviewed.   ROS: Per HPI unless specifically indicated in ROS section   nad Ncat On O2 at baseline Mmm Neck supple. No LA rrr ctab abd soft Ext w/o edema.

## 2016-08-12 NOTE — Assessment & Plan Note (Signed)
Continue Ambien. No adverse effect. Routine cautions given. He agrees.

## 2016-08-12 NOTE — Patient Instructions (Signed)
Don't change your meds for now.  Go to the lab on the way out.  We'll contact you with your lab report. Take care.  Glad to see you.  

## 2016-08-12 NOTE — Assessment & Plan Note (Signed)
Discussed with patient, we need to evaluate for other contributing factor. If he has any persistent abnormality we may need to address that first, especially iron deficiency anemia, etc. Discussed with patient and he understood. Wife understood. See notes on labs.

## 2016-08-13 ENCOUNTER — Encounter: Payer: Self-pay | Admitting: Family Medicine

## 2016-08-13 LAB — CBC WITH DIFFERENTIAL/PLATELET
Basophils Absolute: 0.1 10*3/uL (ref 0.0–0.1)
Basophils Relative: 1.4 % (ref 0.0–3.0)
EOS ABS: 0.2 10*3/uL (ref 0.0–0.7)
Eosinophils Relative: 2 % (ref 0.0–5.0)
HCT: 31.3 % — ABNORMAL LOW (ref 39.0–52.0)
HEMOGLOBIN: 9.8 g/dL — AB (ref 13.0–17.0)
Lymphocytes Relative: 16.4 % (ref 12.0–46.0)
Lymphs Abs: 1.4 10*3/uL (ref 0.7–4.0)
MCHC: 31.3 g/dL (ref 30.0–36.0)
MCV: 76.2 fl — ABNORMAL LOW (ref 78.0–100.0)
MONO ABS: 0.6 10*3/uL (ref 0.1–1.0)
Monocytes Relative: 6.8 % (ref 3.0–12.0)
Neutro Abs: 6.3 10*3/uL (ref 1.4–7.7)
Neutrophils Relative %: 73.4 % (ref 43.0–77.0)
Platelets: 364 10*3/uL (ref 150.0–400.0)
RBC: 4.11 Mil/uL — AB (ref 4.22–5.81)
RDW: 19.9 % — ABNORMAL HIGH (ref 11.5–15.5)
WBC: 8.6 10*3/uL (ref 4.0–10.5)

## 2016-08-13 LAB — COMPREHENSIVE METABOLIC PANEL
ALBUMIN: 3.6 g/dL (ref 3.5–5.2)
ALK PHOS: 68 U/L (ref 39–117)
ALT: 6 U/L (ref 0–53)
AST: 11 U/L (ref 0–37)
BILIRUBIN TOTAL: 0.4 mg/dL (ref 0.2–1.2)
BUN: 20 mg/dL (ref 6–23)
CO2: 25 mEq/L (ref 19–32)
CREATININE: 1.28 mg/dL (ref 0.40–1.50)
Calcium: 8.8 mg/dL (ref 8.4–10.5)
Chloride: 105 mEq/L (ref 96–112)
GFR: 58.68 mL/min — ABNORMAL LOW (ref >60.00)
Glucose, Bld: 153 mg/dL — ABNORMAL HIGH (ref 70–99)
Potassium: 4.7 mEq/L (ref 3.5–5.1)
SODIUM: 140 meq/L (ref 135–145)
TOTAL PROTEIN: 6.7 g/dL (ref 6.0–8.3)

## 2016-08-13 LAB — TSH: TSH: 0.25 u[IU]/mL — AB (ref 0.35–4.50)

## 2016-08-13 LAB — IBC PANEL
IRON: 30 ug/dL — AB (ref 42–165)
SATURATION RATIOS: 6.6 % — AB (ref 20.0–50.0)
Transferrin: 323 mg/dL (ref 212.0–360.0)

## 2016-08-13 NOTE — Progress Notes (Signed)
PA for Zolpidem approved 07/10/16 thru 08/09/17.

## 2016-08-15 ENCOUNTER — Other Ambulatory Visit: Payer: Self-pay | Admitting: Family Medicine

## 2016-08-15 DIAGNOSIS — D509 Iron deficiency anemia, unspecified: Secondary | ICD-10-CM

## 2016-08-15 MED ORDER — FERROUS SULFATE 325 (65 FE) MG PO TABS: 325.0000 mg | ORAL_TABLET | Freq: Two times a day (BID) | ORAL | Status: DC

## 2016-08-20 ENCOUNTER — Ambulatory Visit (INDEPENDENT_AMBULATORY_CARE_PROVIDER_SITE_OTHER): Payer: Medicare Other | Admitting: Pulmonary Disease

## 2016-08-20 ENCOUNTER — Other Ambulatory Visit: Payer: Self-pay | Admitting: Family Medicine

## 2016-08-20 ENCOUNTER — Ambulatory Visit (INDEPENDENT_AMBULATORY_CARE_PROVIDER_SITE_OTHER)
Admission: RE | Admit: 2016-08-20 | Discharge: 2016-08-20 | Disposition: A | Discharge status: Home / Self Care | Payer: Medicare Other | Source: Ambulatory Visit | Attending: Pulmonary Disease | Admitting: Pulmonary Disease

## 2016-08-20 ENCOUNTER — Encounter: Payer: Self-pay | Admitting: Pulmonary Disease

## 2016-08-20 VITALS — BP 130/80 | HR 68 | Ht 63.0 in | Wt 156.0 lb

## 2016-08-20 DIAGNOSIS — J8410 Pulmonary fibrosis, unspecified: Secondary | ICD-10-CM | POA: Diagnosis not present

## 2016-08-20 DIAGNOSIS — T462X5A Adverse effect of other antidysrhythmic drugs, initial encounter: Secondary | ICD-10-CM

## 2016-08-20 DIAGNOSIS — J984 Other disorders of lung: Secondary | ICD-10-CM | POA: Diagnosis not present

## 2016-08-20 DIAGNOSIS — I255 Ischemic cardiomyopathy: Secondary | ICD-10-CM

## 2016-08-20 MED ORDER — FLUTICASONE FUROATE-VILANTEROL 200-25 MCG/INH IN AEPB: 1.0000 | INHALATION_SPRAY | Freq: Every day | RESPIRATORY_TRACT | 6 refills | Status: DC

## 2016-08-20 NOTE — Progress Notes (Signed)
[name]    [id number]    [birth year]  Primary Care Physician:Duncan, Elveria Rising, MD  Referring Physician: Tonia Ghent, MD [address] [place], [place] [address]  Chief complaint:  Follow up for  Emphysema Amiodarone toxicity. Atrial fibrillation, VT  HPI: 71 y.o. male with extensive cardiac history and several admissions for HCAP which he failed several out patient antibiotic treatments. Admitted on 11/28/15 with SOB, hypoxia and worsening diffuse bilateral infiltrates. He had immunologic workup which showed borderline CCP and rheumatoid factor. He was was on amiodarone from April to October of 2017 and was diagnosed with amiodarone toxicity. He got taken off amio at that point and got a slow prednisone taper which was completed on Feb of 2018. He continues on supplemental oxygen.   Interim History: He had an admission in early June 2018 for arrhythmia and was restarted on amio on for persistent VT. As per the cardiology notes there are no good options for him to control the arrythmia. Sotalol has been stopped and he continues on the mexiletine. He was initially on amio 200 mg bid which has been reduced to 200 mg daily. Sed rates are being followed and the last value on 07/08/16 is low.  Reports increased dyspnea with cough and clear mucus. No wheezing, fevers, chills, hemoptysis  Outpatient Encounter Prescriptions as of 08/20/2016  Medication Sig  . acetaminophen (TYLENOL) 325 MG tablet Take 2 tablets (650 mg total) by mouth every 6 (six) hours as needed for mild pain (or Fever >/= 101).  [name] amiodarone (PACERONE) 200 MG tablet Take 1 tablet (200 mg total) by mouth daily.  [name] apixaban (ELIQUIS) 2.5 MG TABS tablet Take 1 tablet (2.5 mg total) by mouth 2 (two) times daily.  . benzonatate (TESSALON) 200 MG capsule Take 1 capsule (200 mg total) by mouth every 4 (four) hours as needed for cough.  . Chlorpheniramine-APAP (CORICIDIN) 2-325 MG TABS Take 2 tablets by mouth  daily as needed (congestion/runny nose).  . ferrous sulfate (FERROUSUL) 325 (65 FE) MG tablet Take 1 tablet (325 mg total) by mouth 2 (two) times daily. (Patient taking differently: Take 325 mg by mouth at bedtime. )  . furosemide (LASIX) 20 MG tablet Take 1 tablet (20 mg total) by mouth every Monday, Wednesday, and Friday.  [name] guaiFENesin (MUCINEX) 600 MG 12 hr tablet Take 1,200 mg by mouth 2 (two) times daily.   . isosorbide mononitrate (IMDUR) 30 MG 24 hr tablet Take 0.5 tablets (15 mg total) by mouth 2 (two) times daily.  [name] levalbuterol (XOPENEX) 0.63 MG/3ML nebulizer solution Take 0.63 mg by nebulization every 4 (four) hours as needed for wheezing or shortness of breath.  . loperamide (IMODIUM) 2 MG capsule Take 2 capsules (4 mg total) by mouth as needed.  . metaxalone (SKELAXIN) 800 MG tablet Take 0.5 tablets (400 mg total) by mouth 3 (three) times daily as needed for muscle spasms.  [name] mexiletine (MEXITIL) 150 MG capsule Take 2 capsules (300 mg total) by mouth every 8 (eight) hours. 8am, 3:30pm, 11:30pm  . nitroGLYCERIN (NITROSTAT) 0.4 MG SL tablet PLACE 1 TABLET (0.4 MG TOTAL) UNDER THE TONGUE EVERY 5 (FIVE) MINUTES AS NEEDED. FOR CHEST PAIN.  [name] OXYGEN Inhale 2 L into the lungs continuous.  [name] PROAIR HFA 108 (90 Base) MCG/ACT inhaler INHALE 1-2 PUFFS INTO THE LUNGS EVERY 6 (SIX) HOURS AS NEEDED FOR WHEEZING OR SHORTNESS OF BREATH.  . promethazine (PHENERGAN) 25 MG tablet  Take 25 mg by mouth every 6 (six) hours as needed for nausea or vomiting.  [name] RANEXA 500 MG 12 hr tablet TAKE 1 TABLET (500 MG TOTAL) BY MOUTH 2 (TWO) TIMES DAILY.  . ranitidine (ZANTAC) 150 MG capsule Take 150 mg by mouth daily as needed for heartburn.   . Simethicone (GAS-X PO) Take 1-2 capsules by mouth daily as needed (pressure/bloating/gas). As needed.   . traMADol (ULTRAM) 50 MG tablet Take 1 tablet (50 mg total) by mouth every 8 (eight) hours as needed (for pain).  [name] zolpidem (AMBIEN) 10 MG tablet Take 0.5 tablets (5 mg  total) by mouth at bedtime as needed for sleep.   Facility-Administered Encounter Medications as of 08/20/2016  Medication  . sodium chloride 0.9 % injection 3 mL  . sodium chloride 0.9 % injection 3 mL    Allergies as of 08/20/2016 - Review Complete 08/20/2016  Allergen Reaction Noted  . Ace inhibitors Other (See Comments) 05/02/2010  . Codeine Other (See Comments)   . Doxycycline Diarrhea and Nausea And Vomiting 11/17/2015  . Kionex [sodium polystyrene sulfonate] Other (See Comments) 02/28/2016  . Penicillins Swelling   . Lisinopril Other (See Comments) 09/22/2014  . Statins Other (See Comments) 08/17/2013  . Hydrocodone Other (See Comments) 05/16/2016  . Lidocaine  03/10/2016  . Pacerone [amiodarone] Other (See Comments) 03/18/2016  . Xanax [alprazolam] Other (See Comments) 05/16/2016    Past Medical History:  Diagnosis Date  . Acute lower GI bleeding 12/11/2011   "first time" (12/11/2011)  . Acute myocardial infarction, unspecified site, episode of care unspecified 1995   Pt living in [place], no stent, ?PTCA  . AICD (automatic cardioverter/defibrillator) present   . Allergic rhinitis, cause unspecified   . Anxiety   . Arthritis    "all over" (11/07/2015)  . Atrial fibrillation ([place])    a. 05/2015 - converted to sinus in setting of ICD shocks; placed on eliquis 5 bid.  [name] CAD (coronary artery disease), autologous vein bypass graft   . Cervical herniated disc    told not to lift >10 lbs  . Chronic systolic CHF (congestive heart failure), NYHA class 2 (HCC)    Reports EF of 25%.   [name] COPD (chronic obstructive pulmonary disease) ([place]) 11/2012   by xray  . Diverticulosis    by CT scan  . HCAP (healthcare-associated pneumonia) 11/06/2015  . Hypertension   . Insomnia   . Paroxysmal ventricular tachycardia ([place])   . Perennial allergic rhinitis    only to dust mites  . Pneumonia 2000s   "walking pneumonia"  . VT (ventricular tachycardia) ([place])    a. 05/2015 - VT storm with  multiple ICD shocks-->Amio 400 BID.    Past Surgical History:  Procedure Laterality Date  . CARDIAC CATHETERIZATION  2004   LAD 30%, D1 30%, CFX-AV groove 70-80%, OM1 30%, EF 20-25%  . CARDIAC CATHETERIZATION N/A 09/28/2015   Procedure: Left Heart Cath and Coronary Angiography;  Surgeon: Peter M Martinique, MD;  Location: [facility];  Service: Cardiovascular;  Laterality: N/A;  . CARDIAC DEFIBRILLATOR PLACEMENT  2004  . CATARACT EXTRACTION W/ INTRAOCULAR LENS IMPLANT Right 01/2012  . COLONOSCOPY  01/08/2012   Procedure: COLONOSCOPY;  Surgeon: Gatha Mayer, MD;  Location: [facility];  Service: Endoscopy;  Laterality: N/A;  . CORONARY ANGIOPLASTY  1995   Pt thinks he got a balloon, living in [place], [place] N/A 02/07/2012   Procedure: IMPLANTABLE CARDIOVERTER DEFIBRILLATOR GENERATOR  CHANGE;  Surgeon: Evans Lance, MD; Medtronic Evera XT VR single-chamber serial number [id number], Laterality: Left  . INSERT / REPLACE / REMOVE PACEMAKER  2004   Medtronic ICD  . KNEE ARTHROSCOPY Left 05/2003   Archie Endo 06/19/2010  . LAPAROSCOPIC CHOLECYSTECTOMY  1/ 2012  . SHOULDER ARTHROSCOPY W/ ROTATOR CUFF REPAIR Right twice  . TONSILLECTOMY AND ADENOIDECTOMY  ~ 1951  . V TACH ABLATION N/A 04/29/2016   Procedure: V Tach Ablation;  Surgeon: Evans Lance, MD;  Location: [facility];  Service: Cardiovascular;  Laterality: N/A;    Family History  Problem Relation Age of Onset  . Diabetes Father   . Tracheal cancer Father 93       smoker  . Stroke Mother   . Cancer Sister        left eye  . CAD Neg Hx   . Colon cancer Neg Hx   . Prostate cancer Neg Hx     Social History   Social History  . Marital status: Married    Spouse name: N/A  . Number of children: 0  . Years of education: N/A   Occupational History  . [employer] truck driver (retired)    Social History Main Topics  . Smoking status: Former Smoker    Packs/day: 0.50     Years: 50.00    Types: Cigarettes, Cigars    Quit date: 09/27/2015  . Smokeless tobacco: Never Used  . Alcohol use No  . Drug use: No  . Sexual activity: Not Currently   Other Topics Concern  . Not on file   Social History Narrative   Lives with wife, married 1998   Grown children, 2 great grandchildren   Occupation: retired, was Probation officer   Activity: walking, fishing   Diet: good water daily, fruits/vegetables rare      Wife is Economist.    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 No known agent orange exposure.     Review of systems: Review of Systems  Constitutional: Negative for fever and chills.  HENT: Negative.   Eyes: Negative for blurred vision.  Respiratory: as per HPI  Cardiovascular: Negative for chest pain and palpitations.  Gastrointestinal: Negative for vomiting, diarrhea, blood per rectum. Genitourinary: Negative for dysuria, urgency, frequency and hematuria.  Musculoskeletal: Negative for myalgias, back pain and joint pain.  Skin: Negative for itching and rash.  Neurological: Negative for dizziness, tremors, focal weakness, seizures and loss of consciousness.  Endo/Heme/Allergies: Negative for environmental allergies.  Psychiatric/Behavioral: Negative for depression, suicidal ideas and hallucinations.  All other systems reviewed and are negative.  Physical Exam: Blood pressure 120/72, pulse 68, height _0  (1.6 m), weight 150 lb 8 oz (68.3 kg), SpO2 90 %. Gen:      No acute distress HEENT:  EOMI, sclera anicteric Neck:     No masses; no thyromegaly Lungs:    Clear to auscultation bilaterally; normal respiratory effort CV:         Regular rate and rhythm; no murmurs Abd:      + bowel sounds; soft, non-tender; no palpable masses, no distension Ext:    No edema; adequate peripheral perfusion Skin:      Warm and dry; no rash Neuro: alert and oriented x 3 Psych: normal mood and affect  Data Reviewed: Imaging [facility] 09/28/15 >> Prox RCA lesion 100%, Mid cx 70%. EF 25%  estimated. Continue medical therapy, no PCI. CT Chest 11/17/15 >> Multi focal infiltrate involving the lower lobes, left greater than right,  and the left upper lobe is most likely an infectious or inflammatory process. Recommend short-term follow-up to ensure resolution. 4 mm nodule in the right upper lobe. Recommend attention on short-term follow-up. TTE 11/30/15 >> EF 25-30% w/ akinesis of inferolateral & inferior myocardium. Grade 2 diastolic dysfunction. RV normal in size and function. CXR 12/19/15 >> small improvement in bilateral opacities. CT high res 08/20/16- basal fibrosis without honeycombing consistent with NSIP. This has not changed since 12/2015 All images personally reviewed.  CP Serologies: CRP: 10.9 ESR: 40 PR-3: <3.5 DS DNA Ab: <1 MPO: <9 RF: 12.1 C3: 78 C4: 9 ANA: Negative ANCA:  Negative Anti-CCP:  19  Assessment:  Emphysema  He is a heavy smoker with emphysematous changes noted on CT scan. The etiology of his increased dyspnea may be due to a combination of COPD, lung fibrosis and heart disease. We will get PFTs for further evaluation. Start him on Symbicort 160/4.5 for dyspnea with wheezing.  Amiodarone toxicity, ? Unspecified ILD He has been restarted on amio recently. I have reviewed his high res CT today which shows baseline fibrosis in NSIP pattern which is unchanged since 12/2015. There are no acute findings to suggest he has recurrence of amio toxicity. He will need close monitoring in the pulmonary clinc and cardiology clinic  Previous autoimmune workup showed borderline CCP and rheumatoid factor. Repeat serologies show normal CCP and borderline rheumatoid factor but he does not have any joint symptoms. At this point there is no clear evidence of connective tissue disease causuing lung fibrosis. We will continue to follow this.  Plan/Recommendations: - Continue supplemental oxygen - Pulmonary function tests - Start Symbicort                                          Marshell Garfinkel MD [facility] Pager [phone] 08/20/2016, 4:53 PM  CC: Tonia Ghent, MD

## 2016-08-20 NOTE — Patient Instructions (Addendum)
Will schedule for pulmonary function test We will start on synbicort to be used twice daily.  Continue albuterol  Return in 1-2 months

## 2016-08-20 NOTE — Telephone Encounter (Signed)
Per review of Pt chart, Pt with recent medical assessment and Pt medication list states Amio 200 mg daily.  No further action required.

## 2016-08-21 ENCOUNTER — Telehealth: Payer: Self-pay | Admitting: Pulmonary Disease

## 2016-08-21 NOTE — Telephone Encounter (Signed)
Spoke with Pharmacist with [facility], who states Breo 200 has a high co pay. Dolly states Symbicort and Flovent are covered alternatives, however Symbicort and Amiodarone have a drug to drug. Dolly states any medication with albuterol will interact with Amiodarone.  PM please advise. Thanks.

## 2016-08-22 ENCOUNTER — Other Ambulatory Visit: Payer: Self-pay | Admitting: Physician Assistant

## 2016-08-22 MED ORDER — BUDESONIDE-FORMOTEROL FUMARATE 160-4.5 MCG/ACT IN AERO: 2.0000 | INHALATION_SPRAY | Freq: Two times a day (BID) | RESPIRATORY_TRACT | 6 refills | Status: DC

## 2016-08-22 NOTE — Telephone Encounter (Signed)
Should be ok to try Symbicort 160/4.5 instead of breo  Chilton Greathouse MD [facility] 08/22/2016, 1:12 PM

## 2016-08-22 NOTE — Telephone Encounter (Signed)
Patient called and stated that he can not afford the high co pay for the [facility]. Patient is aware a message has already been sent to PM to discuss if he is ok with switching to covered alternatives. He had no additional questions at this time. Will await for PM's response

## 2016-08-22 NOTE — Telephone Encounter (Signed)
Pt now calling about this medicine also [phone] wants to know why he cant take the cheaper one instead of the high dollar one

## 2016-08-22 NOTE — Telephone Encounter (Signed)
Called and spoke with pt and he is aware of change in med.

## 2016-08-26 ENCOUNTER — Telehealth: Payer: Self-pay

## 2016-08-27 ENCOUNTER — Ambulatory Visit (INDEPENDENT_AMBULATORY_CARE_PROVIDER_SITE_OTHER): Payer: Medicare Other | Admitting: Internal Medicine

## 2016-08-27 ENCOUNTER — Encounter: Payer: Self-pay | Admitting: Internal Medicine

## 2016-08-27 VITALS — BP 122/80 | HR 69 | Ht 63.0 in | Wt 157.6 lb

## 2016-08-27 DIAGNOSIS — I255 Ischemic cardiomyopathy: Secondary | ICD-10-CM | POA: Diagnosis not present

## 2016-08-27 DIAGNOSIS — I5022 Chronic systolic (congestive) heart failure: Secondary | ICD-10-CM | POA: Diagnosis not present

## 2016-08-27 DIAGNOSIS — I2589 Other forms of chronic ischemic heart disease: Secondary | ICD-10-CM

## 2016-08-27 DIAGNOSIS — I472 Ventricular tachycardia, unspecified: Secondary | ICD-10-CM

## 2016-08-27 MED ORDER — MEXILETINE HCL 150 MG PO CAPS: 300.0000 mg | ORAL_CAPSULE | Freq: Three times a day (TID) | ORAL | 11 refills | Status: DC

## 2016-08-27 MED ORDER — MEXILETINE HCL 150 MG PO CAPS: 300.0000 mg | ORAL_CAPSULE | Freq: Three times a day (TID) | ORAL | 11 refills | Status: AC

## 2016-08-27 MED ORDER — NITROGLYCERIN 0.4 MG SL SUBL: SUBLINGUAL_TABLET | SUBLINGUAL | 1 refills | Status: DC

## 2016-08-27 NOTE — Telephone Encounter (Signed)
LVM 7/23 for call back d/t shock seen on remote monitor

## 2016-08-27 NOTE — Patient Instructions (Signed)
Medication Instructions:  Your physician recommends that you continue on your current medications as directed. Please refer to the Current Medication list given to you today.   Labwork: None ordered.   Testing/Procedures: None ordered.   Follow-Up: Your physician wants you to follow-up in: 3 months with Dr. [name].  You will receive a reminder letter in the mail two months in advance. If you don't receive a letter, please call our office to schedule the follow-up appointment.  Remote monitoring is used to monitor your ICD from home. This monitoring reduces the number of office visits required to check your device to one time per year. It allows [place] to keep an eye on the functioning of your device to ensure it is working properly. You are scheduled for a device check from home on 11/26/2016. You may send your transmission at any time that day. If you have a wireless device, the transmission will be sent automatically. After your physician reviews your transmission, you will receive a postcard with your next transmission date.    Any Other Special Instructions Will Be Listed Below (If Applicable).     If you need a refill on your cardiac medications before your next appointment, please call your pharmacy.

## 2016-08-27 NOTE — Progress Notes (Signed)
HPI Mr. Hobbins returns today for followup. He is a 71 year old man with an ischemic cardiomyopathy, chronic systolic heart failure, class III, persistent atrial fibrillation, on Eliquis, ventricular tachycardia, status post ICD implantation, s/p recent ablation. Unfortunately he has continued to have VT. He has been placed back on amiodarone. His VT has improved. However, he has had 2 ICD therapies in the past week. His dyspnea remains at this point and her remains on supplemental oxygen. Allergies  Allergen Reactions  . Ace Inhibitors Other (See Comments)    muscle pain. Tolerates ARBs.   . Codeine Other (See Comments)    "head wants to explode."  . Doxycycline Diarrhea and Nausea And Vomiting  . Kionex [Sodium Polystyrene Sulfonate] Other (See Comments)    SOB, pressure in chest, weakness fatigue.   [name] Penicillins Swelling    "started at point of injection; w/in 3 min my upper arm was swollen 3 times normal" Has patient had a PCN reaction causing immediate rash, facial/tongue/throat swelling, SOB or lightheadedness with hypotension: Yes Has patient had a PCN reaction causing severe rash involving mucus membranes or skin necrosis: No Has patient had a PCN reaction that required hospitalization No Has patient had a PCN reaction occurring within the last 10 years: No If all of the above answers are "NO", then may proceed wi  . Lisinopril Other (See Comments)    Muscle Pain  . Statins Other (See Comments)    Myalgias per patient  . Hydrocodone Other (See Comments)    Severe headache, esp when combined with skelaxin.    . Lidocaine     Hallucinations, jerking  . Pacerone [Amiodarone] Other (See Comments)    Lung and heart problem  . Xanax [Alprazolam] Other (See Comments)    Nightmares.       Current Outpatient Prescriptions  Medication Sig Dispense Refill  . acetaminophen (TYLENOL) 325 MG tablet Take 2 tablets (650 mg total) by mouth every 6 (six) hours as needed for mild pain (or  Fever >/= 101).    [name] amiodarone (PACERONE) 200 MG tablet Take 1 tablet (200 mg total) by mouth daily. 90 tablet 3  . apixaban (ELIQUIS) 2.5 MG TABS tablet Take 1 tablet (2.5 mg total) by mouth 2 (two) times daily. 180 tablet 3  . benzonatate (TESSALON) 200 MG capsule Take 1 capsule (200 mg total) by mouth every 4 (four) hours as needed for cough. 20 capsule 0  . budesonide-formoterol (SYMBICORT) 160-4.5 MCG/ACT inhaler Inhale 2 puffs into the lungs 2 (two) times daily. 1 Inhaler 6  . Chlorpheniramine-APAP (CORICIDIN) 2-325 MG TABS Take 2 tablets by mouth daily as needed (congestion/runny nose).    . ferrous sulfate (FERROUSUL) 325 (65 FE) MG tablet Take 1 tablet (325 mg total) by mouth 2 (two) times daily.    . furosemide (LASIX) 20 MG tablet Take 1 tablet (20 mg total) by mouth every Monday, Wednesday, and Friday. 45 tablet 3  . guaiFENesin (MUCINEX) 600 MG 12 hr tablet Take 1,200 mg by mouth 2 (two) times daily.     . isosorbide mononitrate (IMDUR) 30 MG 24 hr tablet Take 0.5 tablets (15 mg total) by mouth 2 (two) times daily. 60 tablet 5  . levalbuterol (XOPENEX) 0.63 MG/3ML nebulizer solution Take 0.63 mg by nebulization every 4 (four) hours as needed for wheezing or shortness of breath.    . loperamide (IMODIUM) 2 MG capsule Take 2 capsules (4 mg total) by mouth as needed.    . metaxalone (SKELAXIN) 800 MG tablet  Take 0.5 tablets (400 mg total) by mouth 3 (three) times daily as needed for muscle spasms.    . nitroGLYCERIN (NITROSTAT) 0.4 MG SL tablet PLACE 1 TABLET (0.4 MG TOTAL) UNDER THE TONGUE EVERY 5 (FIVE) MINUTES AS NEEDED. FOR CHEST PAIN. 25 tablet 1  . OXYGEN Inhale 2 L into the lungs continuous.    [name] PROAIR HFA 108 (90 Base) MCG/ACT inhaler INHALE 1-2 PUFFS INTO THE LUNGS EVERY 6 (SIX) HOURS AS NEEDED FOR WHEEZING OR SHORTNESS OF BREATH. 8.5 Inhaler 0  . promethazine (PHENERGAN) 25 MG tablet Take 25 mg by mouth every 6 (six) hours as needed for nausea or vomiting.    [name] RANEXA 500 MG 12  hr tablet TAKE 1 TABLET (500 MG TOTAL) BY MOUTH 2 (TWO) TIMES DAILY. 180 tablet 3  . ranitidine (ZANTAC) 150 MG capsule Take 150 mg by mouth daily as needed for heartburn.     . Simethicone (GAS-X PO) Take 1-2 capsules by mouth daily as needed (pressure/bloating/gas). As needed.     . traMADol (ULTRAM) 50 MG tablet Take 1 tablet (50 mg total) by mouth every 8 (eight) hours as needed (for pain). 100 tablet 1  . zolpidem (AMBIEN) 10 MG tablet Take 0.5 tablets (5 mg total) by mouth at bedtime as needed for sleep. 30 tablet 5  . mexiletine (MEXITIL) 150 MG capsule Take 2 capsules (300 mg total) by mouth every 8 (eight) hours. 8am, 3:30pm, 11:30pm 180 capsule 11   No current facility-administered medications for this visit.    Facility-Administered Medications Ordered in Other Visits  Medication Dose Route Frequency Provider Last Rate Last Dose  . sodium chloride 0.9 % injection 3 mL  3 mL Intravenous Q12H Marinus Maw, MD      . sodium chloride 0.9 % injection 3 mL  3 mL Intravenous PRN Marinus Maw, MD         Past Medical History:  Diagnosis Date  . Acute lower GI bleeding 12/11/2011   "first time" (12/11/2011)  . Acute myocardial infarction, unspecified site, episode of care unspecified 1995   Pt living in [place], no stent, ?PTCA  . AICD (automatic cardioverter/defibrillator) present   . Allergic rhinitis, cause unspecified   . Anxiety   . Arthritis    "all over" (11/07/2015)  . Atrial fibrillation (HCC)    a. 05/2015 - converted to sinus in setting of ICD shocks; placed on eliquis 5 bid.  [name] CAD (coronary artery disease), autologous vein bypass graft   . Cervical herniated disc    told not to lift >10 lbs  . Chronic systolic CHF (congestive heart failure), NYHA class 2 (HCC)    Reports EF of 25%.   [name] COPD (chronic obstructive pulmonary disease) (HCC) 11/2012   by xray  . Diverticulosis    by CT scan  . HCAP (healthcare-associated pneumonia) 11/06/2015  . Hypertension   .  Insomnia   . Paroxysmal ventricular tachycardia (HCC)   . Perennial allergic rhinitis    only to dust mites  . Pneumonia 2000s   "walking pneumonia"  . VT (ventricular tachycardia) (HCC)    a. 05/2015 - VT storm with multiple ICD shocks-->Amio 400 BID.    ROS:   All systems reviewed and negative except as noted in the HPI.   Past Surgical History:  Procedure Laterality Date  . CARDIAC CATHETERIZATION  2004   LAD 30%, D1 30%, CFX-AV groove 70-80%, OM1 30%, EF 20-25%  . CARDIAC CATHETERIZATION N/A 09/28/2015  Procedure: Left Heart Cath and Coronary Angiography;  Surgeon: Peter M Swaziland, MD;  Location: [facility] INVASIVE CV LAB;  Service: Cardiovascular;  Laterality: N/A;  . CARDIAC DEFIBRILLATOR PLACEMENT  2004  . CATARACT EXTRACTION W/ INTRAOCULAR LENS IMPLANT Right 01/2012  . COLONOSCOPY  01/08/2012   Procedure: COLONOSCOPY;  Surgeon: Iva Boop, MD;  Location: [facility] ENDOSCOPY;  Service: Endoscopy;  Laterality: N/A;  . CORONARY ANGIOPLASTY  1995   Pt thinks he got a balloon, living in [place], [place]  . IMPLANTABLE CARDIOVERTER DEFIBRILLATOR GENERATOR CHANGE N/A 02/07/2012   Procedure: IMPLANTABLE CARDIOVERTER DEFIBRILLATOR GENERATOR CHANGE;  Surgeon: Marinus Maw, MD; Medtronic Evera XT VR single-chamber serial number [id number], Laterality: Left  . INSERT / REPLACE / REMOVE PACEMAKER  2004   Medtronic ICD  . KNEE ARTHROSCOPY Left 05/2003   Hattie Perch 06/19/2010  . LAPAROSCOPIC CHOLECYSTECTOMY  1/ 2012  . SHOULDER ARTHROSCOPY W/ ROTATOR CUFF REPAIR Right twice  . TONSILLECTOMY AND ADENOIDECTOMY  ~ 1951  . V TACH ABLATION N/A 04/29/2016   Procedure: V Tach Ablation;  Surgeon: Marinus Maw, MD;  Location: [facility] INVASIVE CV LAB;  Service: Cardiovascular;  Laterality: N/A;     Family History  Problem Relation Age of Onset  . Diabetes Father   . Tracheal cancer Father 3       smoker  . Stroke Mother   . Cancer Sister        left eye  . CAD Neg Hx   . Colon cancer Neg Hx   . Prostate  cancer Neg Hx      Social History   Social History  . Marital status: Married    Spouse name: N/A  . Number of children: 0  . Years of education: N/A   Occupational History  . [employer] truck driver (retired)    Social History Main Topics  . Smoking status: Former Smoker    Packs/day: 0.50    Years: 50.00    Types: Cigarettes, Cigars    Quit date: 09/27/2015  . Smokeless tobacco: Never Used  . Alcohol use No  . Drug use: No  . Sexual activity: Not Currently   Other Topics Concern  . Not on file   Social History Narrative   Lives with wife, married 1998   Grown children, 2 great grandchildren   Occupation: retired, was Presenter, broadcasting   Activity: walking, fishing   Diet: good water daily, fruits/vegetables rare      Wife is Product manager.    [id number], Human resources officer. No known agent orange exposure.       BP 122/80   Pulse 69   Ht 5\' 3"  (1.6 m)   Wt 157 lb 9.6 oz (71.5 kg)   SpO2 92%   BMI 27.92 kg/m   Physical Exam:  Chronically ill appearing 71 yo man, NAD HEENT: Unremarkable except for nasal cannula in place Neck:  6 cm JVD, no thyromegally Lungs:  Clear except for rales in the bases. No wheezes or rhonchi. Well-healed ICD incision. HEART:  Regular brady rhythm, no murmurs, no rubs, no clicks Abd:  soft, positive bowel sounds, no organomegally, no rebound, no guarding Ext:  2 plus pulses, no edema, no cyanosis, no clubbing Skin:  No rashes no nodules Neuro:  CN II through XII intact, motor grossly intact  ECG - NSR with RBBB  DEVICE  Normal device function.  See PaceArt for details. VT noted  Assess/Plan:  1. VT - he has restarted amiodarone and is doing fairly  well. No change in treatment as he will continue his amiodarone for now. 2. Chronic systolic heart failure - his symptoms are controlled. No change in meds. He is on oxygen therapy. His lung disease and age make him not a candidate for heart transplant. 3. ICD - his Medtronic device is working normally.  Will recheck in several months.  4. Atrial fib - he is at risk for stroke. He is tolerating his Eliquis for thromboembolic prevention but is having lots of skin bleeding and insists on his dose being reduced to 2.5 mg twice daily.  [name].D.

## 2016-08-29 ENCOUNTER — Other Ambulatory Visit (INDEPENDENT_AMBULATORY_CARE_PROVIDER_SITE_OTHER): Payer: Medicare Other

## 2016-08-29 DIAGNOSIS — D509 Iron deficiency anemia, unspecified: Secondary | ICD-10-CM

## 2016-08-29 LAB — CBC WITH DIFFERENTIAL/PLATELET
BASOS ABS: 0.1 10*3/uL (ref 0.0–0.1)
Basophils Relative: 1.4 % (ref 0.0–3.0)
EOS PCT: 3.9 % (ref 0.0–5.0)
Eosinophils Absolute: 0.3 10*3/uL (ref 0.0–0.7)
HCT: 34.4 % — ABNORMAL LOW (ref 39.0–52.0)
Hemoglobin: 10.5 g/dL — ABNORMAL LOW (ref 13.0–17.0)
LYMPHS ABS: 1.7 10*3/uL (ref 0.7–4.0)
LYMPHS PCT: 20.2 % (ref 12.0–46.0)
MCHC: 30.5 g/dL (ref 30.0–36.0)
MCV: 81.3 fl (ref 78.0–100.0)
MONOS PCT: 7.6 % (ref 3.0–12.0)
Monocytes Absolute: 0.6 10*3/uL (ref 0.1–1.0)
NEUTROS ABS: 5.5 10*3/uL (ref 1.4–7.7)
NEUTROS PCT: 66.9 % (ref 43.0–77.0)
PLATELETS: 323 10*3/uL (ref 150.0–400.0)
RBC: 4.22 Mil/uL (ref 4.22–5.81)
RDW: 24.7 % — ABNORMAL HIGH (ref 11.5–15.5)
WBC: 8.3 10*3/uL (ref 4.0–10.5)

## 2016-08-29 LAB — IRON: Iron: 71 ug/dL (ref 42–165)

## 2016-09-01 ENCOUNTER — Other Ambulatory Visit: Payer: Self-pay | Admitting: Family Medicine

## 2016-09-01 DIAGNOSIS — D509 Iron deficiency anemia, unspecified: Secondary | ICD-10-CM

## 2016-09-02 LAB — CUP PACEART INCLINIC DEVICE CHECK
Battery Voltage: 2.89 V
Brady Statistic RV Percent Paced: 0.14 %
Date Time Interrogation Session: 20180724172613
HIGH POWER IMPEDANCE MEASURED VALUE: 39 Ohm
HIGH POWER IMPEDANCE MEASURED VALUE: 45 Ohm
Implantable Lead Location: 753860
Lead Channel Impedance Value: 342 Ohm
Lead Channel Impedance Value: 380 Ohm
Lead Channel Pacing Threshold Amplitude: 0.75 V
Lead Channel Sensing Intrinsic Amplitude: 3.125 mV
Lead Channel Setting Pacing Amplitude: 2.5 V
Lead Channel Setting Pacing Pulse Width: 0.4 ms
Lead Channel Setting Sensing Sensitivity: 0.3 mV
MDC IDC LEAD IMPLANT DT: 20040722
MDC IDC MSMT BATTERY REMAINING LONGEVITY: 33 mo
MDC IDC MSMT LEADCHNL RV PACING THRESHOLD PULSEWIDTH: 0.4 ms
MDC IDC PG IMPLANT DT: 20140103

## 2016-09-05 ENCOUNTER — Telehealth: Payer: Self-pay

## 2016-09-05 NOTE — Telephone Encounter (Signed)
Reviewed with patient Dr. Bruna Potter recommendations to come in to the office for adjustment of his ATP therapy. Patient verbalized understanding but stated he would be unable to make it into the office today. Will schedule him on the next day Dr. [name] is in the office 8/21 - okay per GT.

## 2016-09-05 NOTE — Telephone Encounter (Signed)
Reviewed shock episode from 8/1 with GT. Will call patient to be seen in the DC on a GT day for adjustment of ATP therapy.

## 2016-09-12 ENCOUNTER — Other Ambulatory Visit: Payer: Self-pay

## 2016-09-12 ENCOUNTER — Other Ambulatory Visit: Payer: Self-pay | Admitting: Family Medicine

## 2016-09-12 NOTE — Telephone Encounter (Signed)
Electronic refill request. Zolpidem Last office visit:   08/12/16 Last Filled:    30 tablet 5 08/12/2016

## 2016-09-16 ENCOUNTER — Emergency Department (HOSPITAL_COMMUNITY): Payer: Medicare Other

## 2016-09-16 ENCOUNTER — Telehealth: Payer: Self-pay | Admitting: Internal Medicine

## 2016-09-16 ENCOUNTER — Inpatient Hospital Stay (HOSPITAL_COMMUNITY)
Admission: EM | Admit: 2016-09-16 | Discharge: 2016-09-21 | DRG: 309 | Disposition: A | Discharge status: Home / Self Care | Payer: Medicare Other | Attending: Cardiology | Admitting: Cardiology

## 2016-09-16 ENCOUNTER — Other Ambulatory Visit: Payer: Self-pay | Admitting: Family Medicine

## 2016-09-16 ENCOUNTER — Encounter (HOSPITAL_COMMUNITY): Payer: Self-pay

## 2016-09-16 DIAGNOSIS — J440 Chronic obstructive pulmonary disease with acute lower respiratory infection: Secondary | ICD-10-CM | POA: Diagnosis present

## 2016-09-16 DIAGNOSIS — Z9861 Coronary angioplasty status: Secondary | ICD-10-CM

## 2016-09-16 DIAGNOSIS — I5022 Chronic systolic (congestive) heart failure: Secondary | ICD-10-CM | POA: Diagnosis present

## 2016-09-16 DIAGNOSIS — Z515 Encounter for palliative care: Secondary | ICD-10-CM

## 2016-09-16 DIAGNOSIS — I2510 Atherosclerotic heart disease of native coronary artery without angina pectoris: Secondary | ICD-10-CM | POA: Diagnosis not present

## 2016-09-16 DIAGNOSIS — J449 Chronic obstructive pulmonary disease, unspecified: Secondary | ICD-10-CM | POA: Diagnosis present

## 2016-09-16 DIAGNOSIS — Z95810 Presence of automatic (implantable) cardiac defibrillator: Secondary | ICD-10-CM

## 2016-09-16 DIAGNOSIS — I255 Ischemic cardiomyopathy: Secondary | ICD-10-CM | POA: Diagnosis present

## 2016-09-16 DIAGNOSIS — I472 Ventricular tachycardia, unspecified: Secondary | ICD-10-CM

## 2016-09-16 DIAGNOSIS — Z888 Allergy status to other drugs, medicaments and biological substances status: Secondary | ICD-10-CM

## 2016-09-16 DIAGNOSIS — J984 Other disorders of lung: Secondary | ICD-10-CM

## 2016-09-16 DIAGNOSIS — I4819 Other persistent atrial fibrillation: Secondary | ICD-10-CM | POA: Diagnosis present

## 2016-09-16 DIAGNOSIS — I517 Cardiomegaly: Secondary | ICD-10-CM | POA: Diagnosis not present

## 2016-09-16 DIAGNOSIS — I4891 Unspecified atrial fibrillation: Secondary | ICD-10-CM | POA: Diagnosis not present

## 2016-09-16 DIAGNOSIS — Z823 Family history of stroke: Secondary | ICD-10-CM

## 2016-09-16 DIAGNOSIS — Z7901 Long term (current) use of anticoagulants: Secondary | ICD-10-CM

## 2016-09-16 DIAGNOSIS — I110 Hypertensive heart disease with heart failure: Secondary | ICD-10-CM | POA: Diagnosis present

## 2016-09-16 DIAGNOSIS — Z4502 Encounter for adjustment and management of automatic implantable cardiac defibrillator: Secondary | ICD-10-CM

## 2016-09-16 DIAGNOSIS — Z66 Do not resuscitate: Secondary | ICD-10-CM | POA: Diagnosis present

## 2016-09-16 DIAGNOSIS — Z885 Allergy status to narcotic agent status: Secondary | ICD-10-CM

## 2016-09-16 DIAGNOSIS — Z801 Family history of malignant neoplasm of trachea, bronchus and lung: Secondary | ICD-10-CM

## 2016-09-16 DIAGNOSIS — Z87891 Personal history of nicotine dependence: Secondary | ICD-10-CM

## 2016-09-16 DIAGNOSIS — Z833 Family history of diabetes mellitus: Secondary | ICD-10-CM

## 2016-09-16 DIAGNOSIS — Z961 Presence of intraocular lens: Secondary | ICD-10-CM | POA: Diagnosis present

## 2016-09-16 DIAGNOSIS — Z9981 Dependence on supplemental oxygen: Secondary | ICD-10-CM

## 2016-09-16 DIAGNOSIS — R5381 Other malaise: Secondary | ICD-10-CM | POA: Diagnosis present

## 2016-09-16 DIAGNOSIS — Z9841 Cataract extraction status, right eye: Secondary | ICD-10-CM

## 2016-09-16 DIAGNOSIS — Z8679 Personal history of other diseases of the circulatory system: Secondary | ICD-10-CM

## 2016-09-16 DIAGNOSIS — Z79899 Other long term (current) drug therapy: Secondary | ICD-10-CM

## 2016-09-16 DIAGNOSIS — I481 Persistent atrial fibrillation: Secondary | ICD-10-CM | POA: Diagnosis present

## 2016-09-16 DIAGNOSIS — Z880 Allergy status to penicillin: Secondary | ICD-10-CM

## 2016-09-16 DIAGNOSIS — Z9889 Other specified postprocedural states: Secondary | ICD-10-CM

## 2016-09-16 DIAGNOSIS — E875 Hyperkalemia: Secondary | ICD-10-CM | POA: Diagnosis present

## 2016-09-16 DIAGNOSIS — I252 Old myocardial infarction: Secondary | ICD-10-CM

## 2016-09-16 DIAGNOSIS — T462X5A Adverse effect of other antidysrhythmic drugs, initial encounter: Secondary | ICD-10-CM | POA: Diagnosis present

## 2016-09-16 DIAGNOSIS — I480 Paroxysmal atrial fibrillation: Secondary | ICD-10-CM | POA: Diagnosis present

## 2016-09-16 HISTORY — DX: Personal history of other diseases of the digestive system: Z87.19

## 2016-09-16 HISTORY — DX: Atherosclerotic heart disease of native coronary artery without angina pectoris: I25.10

## 2016-09-16 HISTORY — DX: Ischemic cardiomyopathy: I25.5

## 2016-09-16 HISTORY — DX: Old myocardial infarction: I25.2

## 2016-09-16 LAB — BASIC METABOLIC PANEL
Anion gap: 9 (ref 5–15)
BUN: 17 mg/dL (ref 6–20)
CO2: 23 mmol/L (ref 22–32)
Calcium: 8.6 mg/dL — ABNORMAL LOW (ref 8.9–10.3)
Chloride: 109 mmol/L (ref 101–111)
Creatinine, Ser: 1.3 mg/dL — ABNORMAL HIGH (ref 0.61–1.24)
GFR calc Af Amer: >60 mL/min (ref >60)
GFR, EST NON AFRICAN AMERICAN: 53 mL/min — AB (ref >60)
GLUCOSE: 150 mg/dL — AB (ref 65–99)
POTASSIUM: 4.4 mmol/L (ref 3.5–5.1)
Sodium: 141 mmol/L (ref 135–145)

## 2016-09-16 LAB — I-STAT CHEM 8, ED
BUN: 20 mg/dL (ref 6–20)
CALCIUM ION: 1.07 mmol/L — AB (ref 1.15–1.40)
CHLORIDE: 108 mmol/L (ref 101–111)
CREATININE: 1.2 mg/dL (ref 0.61–1.24)
GLUCOSE: 148 mg/dL — AB (ref 65–99)
HCT: 41 % (ref 39.0–52.0)
Hemoglobin: 13.9 g/dL (ref 13.0–17.0)
POTASSIUM: 4.4 mmol/L (ref 3.5–5.1)
Sodium: 144 mmol/L (ref 135–145)
TCO2: 24 mmol/L (ref 0–100)

## 2016-09-16 LAB — CBC
HEMATOCRIT: 40.5 % (ref 39.0–52.0)
HEMOGLOBIN: 12.3 g/dL — AB (ref 13.0–17.0)
MCH: 25.9 pg — AB (ref 26.0–34.0)
MCHC: 30.4 g/dL (ref 30.0–36.0)
MCV: 85.3 fL (ref 78.0–100.0)
Platelets: 351 10*3/uL (ref 150–400)
RBC: 4.75 MIL/uL (ref 4.22–5.81)
RDW: 22.4 % — AB (ref 11.5–15.5)
WBC: 7.9 10*3/uL (ref 4.0–10.5)

## 2016-09-16 LAB — I-STAT TROPONIN, ED: Troponin i, poc: 0 ng/mL (ref 0.00–0.08)

## 2016-09-16 LAB — MAGNESIUM: Magnesium: 2.1 mg/dL (ref 1.7–2.4)

## 2016-09-16 MED ORDER — AMIODARONE IV BOLUS ONLY 150 MG/100ML
150.0000 mg | Freq: Once | INTRAVENOUS | Status: AC
  Administered 2016-09-16: 150 mg via INTRAVENOUS
  Filled 2016-09-16: qty 100

## 2016-09-16 MED ORDER — ACETAMINOPHEN 325 MG PO TABS
650.0000 mg | ORAL_TABLET | Freq: Four times a day (QID) | ORAL | Status: DC | PRN
  Administered 2016-09-17 – 2016-09-19 (×3): 650 mg via ORAL
  Filled 2016-09-16 (×3): qty 2

## 2016-09-16 MED ORDER — MEXILETINE HCL 150 MG PO CAPS
300.0000 mg | ORAL_CAPSULE | ORAL | Status: DC
  Administered 2016-09-17 – 2016-09-21 (×13): 300 mg via ORAL
  Filled 2016-09-16 (×17): qty 2

## 2016-09-16 MED ORDER — DIPHENHYDRAMINE HCL 25 MG PO CAPS: 25.0000 mg | ORAL_CAPSULE | Freq: Every day | ORAL | Status: DC | PRN

## 2016-09-16 MED ORDER — FERROUS SULFATE 325 (65 FE) MG PO TABS
325.0000 mg | ORAL_TABLET | Freq: Two times a day (BID) | ORAL | Status: DC
  Administered 2016-09-17 – 2016-09-21 (×6): 325 mg via ORAL
  Filled 2016-09-16 (×11): qty 1

## 2016-09-16 MED ORDER — APIXABAN 2.5 MG PO TABS
2.5000 mg | ORAL_TABLET | Freq: Two times a day (BID) | ORAL | Status: DC
  Administered 2016-09-17 – 2016-09-20 (×8): 2.5 mg via ORAL
  Filled 2016-09-16 (×9): qty 1

## 2016-09-16 MED ORDER — LOPERAMIDE HCL 2 MG PO CAPS: 2.0000 mg | ORAL_CAPSULE | ORAL | Status: DC

## 2016-09-16 MED ORDER — GUAIFENESIN ER 600 MG PO TB12
1200.0000 mg | ORAL_TABLET | Freq: Two times a day (BID) | ORAL | Status: DC
  Administered 2016-09-17 – 2016-09-21 (×10): 1200 mg via ORAL
  Filled 2016-09-16 (×10): qty 2

## 2016-09-16 MED ORDER — ONDANSETRON HCL 4 MG/2ML IJ SOLN
4.0000 mg | Freq: Four times a day (QID) | INTRAMUSCULAR | Status: DC | PRN
  Administered 2016-09-17: 4 mg via INTRAVENOUS
  Filled 2016-09-16: qty 2

## 2016-09-16 MED ORDER — FUROSEMIDE 20 MG PO TABS
20.0000 mg | ORAL_TABLET | ORAL | Status: DC
  Administered 2016-09-18 – 2016-09-20 (×2): 20 mg via ORAL
  Filled 2016-09-16 (×4): qty 1

## 2016-09-16 MED ORDER — RANOLAZINE ER 500 MG PO TB12
500.0000 mg | ORAL_TABLET | Freq: Two times a day (BID) | ORAL | Status: DC
  Administered 2016-09-17 – 2016-09-20 (×8): 500 mg via ORAL
  Filled 2016-09-16 (×10): qty 1

## 2016-09-16 MED ORDER — MOMETASONE FURO-FORMOTEROL FUM 200-5 MCG/ACT IN AERO
2.0000 | INHALATION_SPRAY | Freq: Two times a day (BID) | RESPIRATORY_TRACT | Status: DC
  Administered 2016-09-19 – 2016-09-21 (×6): 2 via RESPIRATORY_TRACT
  Filled 2016-09-16 (×2): qty 8.8

## 2016-09-16 MED ORDER — ALBUTEROL SULFATE HFA 108 (90 BASE) MCG/ACT IN AERS: 1.0000 | INHALATION_SPRAY | Freq: Four times a day (QID) | RESPIRATORY_TRACT | Status: DC | PRN

## 2016-09-16 MED ORDER — ZOLPIDEM TARTRATE 5 MG PO TABS
5.0000 mg | ORAL_TABLET | Freq: Every evening | ORAL | Status: DC | PRN
  Administered 2016-09-17 – 2016-09-20 (×4): 5 mg via ORAL
  Filled 2016-09-16 (×4): qty 1

## 2016-09-16 MED ORDER — METAXALONE 400 MG HALF TABLET
400.0000 mg | ORAL_TABLET | Freq: Three times a day (TID) | ORAL | Status: DC | PRN
  Filled 2016-09-16: qty 1

## 2016-09-16 MED ORDER — ACETAMINOPHEN 325 MG PO TABS: 325.0000 mg | ORAL_TABLET | Freq: Every day | ORAL | Status: DC | PRN

## 2016-09-16 MED ORDER — FAMOTIDINE 20 MG PO TABS
10.0000 mg | ORAL_TABLET | Freq: Every day | ORAL | Status: DC
  Administered 2016-09-17 – 2016-09-21 (×5): 10 mg via ORAL
  Filled 2016-09-16 (×5): qty 1

## 2016-09-16 MED ORDER — CHLORPHENIRAMINE-ACETAMINOPHEN 2-325 MG PO TABS: 2.0000 | ORAL_TABLET | Freq: Every day | ORAL | Status: DC | PRN

## 2016-09-16 MED ORDER — BENZONATATE 100 MG PO CAPS: 200.0000 mg | ORAL_CAPSULE | ORAL | Status: DC | PRN

## 2016-09-16 MED ORDER — PROMETHAZINE HCL 25 MG PO TABS: 25.0000 mg | ORAL_TABLET | Freq: Four times a day (QID) | ORAL | Status: DC | PRN

## 2016-09-16 MED ORDER — TRAMADOL HCL 50 MG PO TABS
50.0000 mg | ORAL_TABLET | Freq: Three times a day (TID) | ORAL | Status: DC | PRN
  Administered 2016-09-17 (×2): 50 mg via ORAL
  Filled 2016-09-16 (×2): qty 1

## 2016-09-16 MED ORDER — AMIODARONE HCL 200 MG PO TABS: 200.0000 mg | ORAL_TABLET | Freq: Every day | ORAL | Status: DC

## 2016-09-16 NOTE — ED Notes (Signed)
Cardiology aware pt's HR 120-130.

## 2016-09-16 NOTE — Telephone Encounter (Signed)
Received refill request electronically for Ambien Last refill 08/12/16 #30/5 Last office visit 08/12/16

## 2016-09-16 NOTE — ED Provider Notes (Signed)
[facility]-EMERGENCY DEPT Provider Note   CSN: [id number] Arrival date & time: 09/16/16  1958     History   Chief Complaint Chief Complaint  Patient presents with  . Pacemaker Problem    HPI [name] is a 71 y.o. male.with history of ischemic cardiomyopathy, VT s/p ICD and multiple ablations currently on amio who presents with ICD firing four times today. Patient called his cardiologist  after 4 episodes of chest tightness and shortness of breath followed by ICD firing. Symptoms resolved after shock. He was told to present to the ED. Patient reports compliance with his amiodarone and mexiletine. He is currently asymptomatic, and denies any palpitations, chest pain or shortness of breath. He denies any recent infectious symptoms. He denies any lower extremity swelling. Patient has chronic dyspnea on exertion, which he manages with 2-3 L of oxygen by nasal cannula at all times. He denies any recent change and shortness of breath.  HPI  Past Medical History:  Diagnosis Date  . Acute lower GI bleeding 12/11/2011   "first time" (12/11/2011)  . Acute myocardial infarction, unspecified site, episode of care unspecified 1995   Pt living in [place], no stent, ?PTCA  . AICD (automatic cardioverter/defibrillator) present   . Allergic rhinitis, cause unspecified   . Anxiety   . Arthritis    "all over" (11/07/2015)  . Atrial fibrillation (HCC)    a. 05/2015 - converted to sinus in setting of ICD shocks; placed on eliquis 5 bid.  [name] CAD (coronary artery disease), autologous vein bypass graft   . Cervical herniated disc    told not to lift >10 lbs  . Chronic systolic CHF (congestive heart failure), NYHA class 2 (HCC)    Reports EF of 25%.   [name] COPD (chronic obstructive pulmonary disease) (HCC) 11/2012   by xray  . Diverticulosis    by CT scan  . HCAP (healthcare-associated pneumonia) 11/06/2015  . Hypertension   . Insomnia   . Paroxysmal ventricular tachycardia (HCC)   . Perennial allergic  rhinitis    only to dust mites  . Pneumonia 2000s   "walking pneumonia"  . VT (ventricular tachycardia) (HCC)    a. 05/2015 - VT storm with multiple ICD shocks-->Amio 400 BID.    Patient Active Problem List   Diagnosis Date Noted  . Iron deficiency anemia 08/15/2016  . RLS (restless legs syndrome) 08/12/2016  . ICD (implantable cardioverter-defibrillator) discharge 06/20/2016  . Atrial tachycardia (HCC) 06/20/2016  . Ventricular tachycardia (HCC) 06/20/2016  . Hyperkalemia 02/13/2016  . AKI (acute kidney injury) (HCC)   . Centrilobular emphysema (HCC)   . Postcholecystectomy diarrhea 12/12/2015  . Hypoalbuminemia due to protein-calorie malnutrition (HCC)   . Anemia of chronic disease   . Chronic systolic CHF (congestive heart failure) (HCC)   . Coronary artery disease involving native coronary artery of native heart without angina pectoris   . S/P implantation of automatic cardioverter/defibrillator (AICD)   . Supplemental oxygen dependent   . Physical deconditioning 11/28/2015  . Transaminitis 11/28/2015  . Hypoxia   . Fall at home 10/11/2015  . VT (ventricular tachycardia) (HCC) 09/27/2015  . Memory loss 09/24/2015  . Encounter for screening examination for infectious disease 09/24/2015  . Chronic atrial fibrillation (HCC) 05/28/2015  . Syncope 05/12/2015  . Advance care planning 09/16/2014  . Muscle ache 09/16/2014  . Back pain 09/08/2013  . Abdominal pain, chronic, epigastric 09/08/2013  . Fatigue 06/11/2013  . Chest wall mass 11/05/2012  . Arthritis   . Insomnia   .  Anxiety   . Ischemic cardiomyopathy 02/12/2011  . Acute on chronic systolic congestive heart failure (HCC) 05/10/2010  . Hyperlipidemia 08/25/2008  . Amiodarone pulmonary toxicity 08/25/2008    Past Surgical History:  Procedure Laterality Date  . CARDIAC CATHETERIZATION  2004   LAD 30%, D1 30%, CFX-AV groove 70-80%, OM1 30%, EF 20-25%  . CARDIAC CATHETERIZATION N/A 09/28/2015   Procedure: Left  Heart Cath and Coronary Angiography;  Surgeon: Peter M Swaziland, MD;  Location: [facility] INVASIVE CV LAB;  Service: Cardiovascular;  Laterality: N/A;  . CARDIAC DEFIBRILLATOR PLACEMENT  2004  . CATARACT EXTRACTION W/ INTRAOCULAR LENS IMPLANT Right 01/2012  . COLONOSCOPY  01/08/2012   Procedure: COLONOSCOPY;  Surgeon: Iva Boop, MD;  Location: [facility] ENDOSCOPY;  Service: Endoscopy;  Laterality: N/A;  . CORONARY ANGIOPLASTY  1995   Pt thinks he got a balloon, living in [place], [place]  . IMPLANTABLE CARDIOVERTER DEFIBRILLATOR GENERATOR CHANGE N/A 02/07/2012   Procedure: IMPLANTABLE CARDIOVERTER DEFIBRILLATOR GENERATOR CHANGE;  Surgeon: Marinus Maw, MD; Medtronic Evera XT VR single-chamber serial number [id number], Laterality: Left  . INSERT / REPLACE / REMOVE PACEMAKER  2004   Medtronic ICD  . KNEE ARTHROSCOPY Left 05/2003   Hattie Perch 06/19/2010  . LAPAROSCOPIC CHOLECYSTECTOMY  1/ 2012  . SHOULDER ARTHROSCOPY W/ ROTATOR CUFF REPAIR Right twice  . TONSILLECTOMY AND ADENOIDECTOMY  ~ 1951  . V TACH ABLATION N/A 04/29/2016   Procedure: V Tach Ablation;  Surgeon: Marinus Maw, MD;  Location: [facility] INVASIVE CV LAB;  Service: Cardiovascular;  Laterality: N/A;       Home Medications    Prior to Admission medications   Medication Sig Start Date End Date Taking? Authorizing Provider  acetaminophen (TYLENOL) 325 MG tablet Take 2 tablets (650 mg total) by mouth every 6 (six) hours as needed for mild pain (or Fever >/= 101). 12/06/15  Yes Joseph Art, DO  amiodarone (PACERONE) 200 MG tablet Take 1 tablet (200 mg total) by mouth daily. 07/22/16  Yes Marinus Maw, MD  apixaban (ELIQUIS) 2.5 MG TABS tablet Take 1 tablet (2.5 mg total) by mouth 2 (two) times daily. 04/05/16  Yes Marinus Maw, MD  benzonatate (TESSALON) 200 MG capsule Take 1 capsule (200 mg total) by mouth every 4 (four) hours as needed for cough. 12/06/15  Yes Vann, Jessica U, DO  budesonide-formoterol (SYMBICORT) 160-4.5 MCG/ACT inhaler Inhale 2  puffs into the lungs 2 (two) times daily. 08/22/16  Yes Mannam, Praveen, MD  Chlorpheniramine-APAP (CORICIDIN) 2-325 MG TABS Take 2 tablets by mouth daily as needed (congestion/runny nose).   Yes [provider]  ferrous sulfate (FERROUSUL) 325 (65 FE) MG tablet Take 1 tablet (325 mg total) by mouth 2 (two) times daily. 08/15/16  Yes Joaquim Nam, MD  furosemide (LASIX) 20 MG tablet Take 1 tablet (20 mg total) by mouth every Monday, Wednesday, and Friday. 04/05/16  Yes Marinus Maw, MD  Guaifenesin [facility] MAXIMUM STRENGTH) 1200 MG TB12 Take 1,200 mg by mouth 2 (two) times daily.   Yes [provider]  isosorbide mononitrate (IMDUR) 30 MG 24 hr tablet Take 0.5 tablets (15 mg total) by mouth 2 (two) times daily. 07/16/16  Yes Marinus Maw, MD  levalbuterol Pauline Aus) 0.63 MG/3ML nebulizer solution Take 0.63 mg by nebulization every 4 (four) hours as needed for wheezing or shortness of breath.   Yes [provider]  loperamide (IMODIUM) 2 MG capsule Take 2 capsules (4 mg total) by mouth as needed. Patient taking  differently: Take 2 mg by mouth See admin instructions. Take 1 capsule (2 mg) by mouth after each bowel movement 08/12/16  Yes Joaquim Nam, MD  metaxalone (SKELAXIN) 800 MG tablet Take 0.5 tablets (400 mg total) by mouth 3 (three) times daily as needed for muscle spasms. 08/12/16  Yes Joaquim Nam, MD  mexiletine (MEXITIL) 150 MG capsule Take 2 capsules (300 mg total) by mouth every 8 (eight) hours. 8am, 3:30pm, 11:30pm 08/27/16  Yes Marinus Maw, MD  nitroGLYCERIN (NITROSTAT) 0.4 MG SL tablet PLACE 1 TABLET (0.4 MG TOTAL) UNDER THE TONGUE EVERY 5 (FIVE) MINUTES AS NEEDED. FOR CHEST PAIN. Patient taking differently: Place 0.4 mg under the tongue every 5 (five) minutes as needed for chest pain.  08/27/16  Yes Marinus Maw, MD  OXYGEN Inhale 2 L into the lungs continuous.   Yes [provider]  PROAIR HFA 108 (90 Base) MCG/ACT inhaler INHALE 1-2  PUFFS INTO THE LUNGS EVERY 6 (SIX) HOURS AS NEEDED FOR WHEEZING OR SHORTNESS OF BREATH. 08/20/16  Yes Joaquim Nam, MD  promethazine (PHENERGAN) 25 MG tablet Take 25 mg by mouth every 6 (six) hours as needed for nausea or vomiting.   Yes [provider]  RANEXA 500 MG 12 hr tablet TAKE 1 TABLET (500 MG TOTAL) BY MOUTH 2 (TWO) TIMES DAILY. 04/01/16  Yes Seiler, Amber K, NP  ranitidine (ZANTAC) 150 MG capsule Take 150 mg by mouth daily as needed for heartburn.    Yes [provider]  Simethicone (GAS-X PO) Take 1-2 capsules by mouth daily as needed (pressure/bloating/gas). As needed.    Yes [provider]  traMADol (ULTRAM) 50 MG tablet Take 1 tablet (50 mg total) by mouth every 8 (eight) hours as needed (for pain). 05/16/16  Yes Joaquim Nam, MD  zolpidem (AMBIEN) 10 MG tablet Take 0.5 tablets (5 mg total) by mouth at bedtime as needed for sleep. Patient taking differently: Take 5-10 mg by mouth at bedtime.  08/12/16  Yes Joaquim Nam, MD    Family History Family History  Problem Relation Age of Onset  . Diabetes Father   . Tracheal cancer Father 74       smoker  . Stroke Mother   . Cancer Sister        left eye  . CAD Neg Hx   . Colon cancer Neg Hx   . Prostate cancer Neg Hx     Social History Social History  Substance Use Topics  . Smoking status: Former Smoker    Packs/day: 0.50    Years: 50.00    Types: Cigarettes, Cigars    Quit date: 09/27/2015  . Smokeless tobacco: Never Used  . Alcohol use No     Allergies   Ace inhibitors; Codeine; Doxycycline; Kionex [sodium polystyrene sulfonate]; Penicillins; Lisinopril; Statins; Hydrocodone; Lidocaine; Pacerone [amiodarone]; and Xanax [alprazolam]   Review of Systems Review of Systems  Constitutional: Negative for chills and fever.  HENT: Negative for ear pain and sore throat.   Eyes: Negative for pain and visual disturbance.  Respiratory: Positive for shortness of breath. Negative for cough.    Cardiovascular: Positive for chest pain. Negative for palpitations.  Gastrointestinal: Negative for abdominal pain and vomiting.  Genitourinary: Negative for dysuria and hematuria.  Musculoskeletal: Negative for arthralgias and back pain.  Skin: Negative for color change and rash.  Neurological: Negative for seizures and syncope.  All other systems reviewed and are negative.    Physical Exam Updated  Vital Signs BP (!) 107/91   Pulse (!) 129   Temp (!) 97.4 F (36.3 C) (Oral)   Resp 16   Ht 5\' 3"  (1.6 m)   Wt 71.2 kg (157 lb)   SpO2 98%   BMI 27.81 kg/m   Physical Exam  Constitutional: He is oriented to person, place, and time. He appears well-developed and well-nourished. No distress.  HENT:  Head: Normocephalic and atraumatic.  Eyes: Conjunctivae are normal.  Neck: Neck supple.  Cardiovascular: Normal rate and regular rhythm.   No murmur heard. Pulmonary/Chest: Effort normal and breath sounds normal. No respiratory distress.  Abdominal: Soft. There is no tenderness.  Musculoskeletal: He exhibits no edema.  Neurological: He is alert and oriented to person, place, and time.  Skin: Skin is warm and dry.  Psychiatric: He has a normal mood and affect.  Nursing note and vitals reviewed.    ED Treatments / Results  Labs (all labs ordered are listed, but only abnormal results are displayed) Labs Reviewed  BASIC METABOLIC PANEL - Abnormal; Notable for the following:       Result Value   Glucose, Bld 150 (*)    Creatinine, Ser 1.30 (*)    Calcium 8.6 (*)    GFR calc non Af Amer 53 (*)    All other components within normal limits  CBC - Abnormal; Notable for the following:    Hemoglobin 12.3 (*)    MCH 25.9 (*)    RDW 22.4 (*)    All other components within normal limits  I-STAT CHEM 8, ED - Abnormal; Notable for the following:    Glucose, Bld 148 (*)    Calcium, Ion 1.07 (*)    All other components within normal limits  MAGNESIUM  I-STAT TROPONIN, ED     EKG  EKG Interpretation  Date/Time:  Monday September 16 2016 22:37:15 EDT Ventricular Rate:  74 PR Interval:  240 QRS Duration: 190 QT Interval:  479 QTC Calculation: 532 R Axis:   154 Text Interpretation:  Sinus rhythm Ventricular premature complex Prolonged PR interval Nonspecific intraventricular conduction delay Probable lateral infarct, old Probable anteroseptal infarct, old Vtach resolved Confirmed by Pricilla Loveless [phone]) on 09/16/2016 10:51:10 PM       Radiology Dg Chest Portable 1 View  Result Date: 09/16/2016 CLINICAL DATA:  ICD fired EXAM: PORTABLE CHEST 1 VIEW COMPARISON:  07/07/2016 FINDINGS: Left-sided pacing device similar compared to prior. Moderate cardiomegaly with mild central congestion. No focal consolidation or large effusion. No pneumothorax. IMPRESSION: Cardiomegaly with minimal central congestion. No acute infiltrate or edema. Electronically Signed   By: Jasmine Pang M.D.   On: 09/16/2016 21:20    Procedures Procedures (including critical care time)  Medications Ordered in ED Medications  amiodarone (NEXTERONE) IV bolus only 150 mg/100 mL (150 mg Intravenous New Bag/Given 09/16/16 2224)     Initial Impression / Assessment and Plan / ED Course  I have reviewed the triage vital signs and the nursing notes.  Pertinent labs & imaging results that were available during my care of the patient were reviewed by me and considered in my medical decision making (see chart for details).     Patient is a 71 year old male with history of ischemic cardiomyopathy, VT s/p ICD and multiple ablations currently on amio who presents with recurrent  VT. Patient initially arrived hemodynamically stable, in no acute distress. Exam as above.   [employer] report from interrogation significant for 5 shocks for VT. Labs within normal limits.  While in ED, patient had recurrence of shortness of breath and chest tightness similar to previous in day. Repeat EKG showing slow VT  in 120-130s. Discussed with Cardiology, gave additional amiodarone bolus with improvement in HR and symptoms.   Plan for admission for EP to see in AM for ICD adjustment. Patient in agreement with plan at time of admission.  Patient and plan of care discussed with Attending physician, Dr. Criss Alvine.      Final Clinical Impressions(s) / ED Diagnoses   Final diagnoses:  Ventricular tachycardia [facility])    New Prescriptions New Prescriptions   No medications on file     Wynelle Cleveland, MD 09/16/16 2350    Pricilla Loveless, MD 09/21/16 1025

## 2016-09-16 NOTE — Telephone Encounter (Signed)
New Message   1. Has your device fired? yes  2. Is you device beeping? No   3. Are you experiencing draining or swelling at device site? No   4. Are you calling to see if we received your device transmission? no  5. Have you passed out? no

## 2016-09-16 NOTE — ED Triage Notes (Signed)
Pt arrives POV after AICD fired 4 times in 24 hours. Py reports he would feel really short of breath and have tightness on left side of chest prior to shock being delivered. PT called Dr. Bruna Potter office and was told he was having runs of vtach causing it to go off. Pt denies chest pain or increased sob.

## 2016-09-16 NOTE — H&P (Signed)
CARDIOLOGY ADMISSION NOTE  Patient ID: [name] MRN: [id number] DOB/AGE: 08/09/1945 71 y.o.  Admit date: 09/16/2016 Primary Physician   Joaquim Nam, MD Primary Cardiologist   Dr. [name] Chief Complaint    ICD firing.    HPI:   The patient is 71 years old and has an ischemic cardiomyopathy and a long history of ventricular tachycardia with ICD, ablation and continued VT with amiodarone.  He has had amiodarone toxicity and for a while was off of this.  However, he had to have this restarted earlier this year because of a lack of other options.  He also is on mexiletine.  He called our office today after his ICD went off 3 times.  Transmission reviewed at our office demonstrated that he had had 8 VT episodes.  He was advised to come to the ED.    He says that he has been at his baseline.  He is very limited by his breathing.  He says that he will start to feel a pressure in his chest followed by some SOB and abdominal bloating.  He has felt about 7 of the 8 episodes of fib and has had 3 shocks.  He denies chest pain otherwise.   He has had no PND or orthopnea.  He has had no fevers or chills.  He has had no weight gain or edema.  He has not had LOC.    Past Medical History:  Diagnosis Date  . Acute lower GI bleeding 12/11/2011   "first time" (12/11/2011)  . Acute myocardial infarction, unspecified site, episode of care unspecified 1995   Pt living in [place], no stent, ?PTCA  . AICD (automatic cardioverter/defibrillator) present   . Allergic rhinitis, cause unspecified   . Anxiety   . Arthritis    "all over" (11/07/2015)  . Atrial fibrillation (HCC)    a. 05/2015 - converted to sinus in setting of ICD shocks; placed on eliquis 5 bid.  [name] CAD (coronary artery disease), autologous vein bypass graft   . Cervical herniated disc    told not to lift >10 lbs  . Chronic systolic CHF (congestive heart failure), NYHA class 2 (HCC)    Reports EF of 25%.   [name] COPD (chronic obstructive  pulmonary disease) (HCC) 11/2012   by xray  . Diverticulosis    by CT scan  . HCAP (healthcare-associated pneumonia) 11/06/2015  . Hypertension   . Insomnia   . Paroxysmal ventricular tachycardia (HCC)   . Perennial allergic rhinitis    only to dust mites  . Pneumonia 2000s   "walking pneumonia"  . VT (ventricular tachycardia) (HCC)    a. 05/2015 - VT storm with multiple ICD shocks-->Amio 400 BID.    Past Surgical History:  Procedure Laterality Date  . CARDIAC CATHETERIZATION  2004   LAD 30%, D1 30%, CFX-AV groove 70-80%, OM1 30%, EF 20-25%  . CARDIAC CATHETERIZATION N/A 09/28/2015   Procedure: Left Heart Cath and Coronary Angiography;  Surgeon: Peter M Swaziland, MD;  Location: [facility] INVASIVE CV LAB;  Service: Cardiovascular;  Laterality: N/A;  . CARDIAC DEFIBRILLATOR PLACEMENT  2004  . CATARACT EXTRACTION W/ INTRAOCULAR LENS IMPLANT Right 01/2012  . COLONOSCOPY  01/08/2012   Procedure: COLONOSCOPY;  Surgeon: Iva Boop, MD;  Location: [facility] ENDOSCOPY;  Service: Endoscopy;  Laterality: N/A;  . CORONARY ANGIOPLASTY  1995   Pt thinks he got a balloon, living in [place], [place]  . IMPLANTABLE CARDIOVERTER DEFIBRILLATOR GENERATOR CHANGE N/A 02/07/2012   Procedure:  IMPLANTABLE CARDIOVERTER DEFIBRILLATOR GENERATOR CHANGE;  Surgeon: Marinus Maw, MD; Medtronic Evera XT VR single-chamber serial number [id number], Laterality: Left  . INSERT / REPLACE / REMOVE PACEMAKER  2004   Medtronic ICD  . KNEE ARTHROSCOPY Left 05/2003   Hattie Perch 06/19/2010  . LAPAROSCOPIC CHOLECYSTECTOMY  1/ 2012  . SHOULDER ARTHROSCOPY W/ ROTATOR CUFF REPAIR Right twice  . TONSILLECTOMY AND ADENOIDECTOMY  ~ 1951  . V TACH ABLATION N/A 04/29/2016   Procedure: V Tach Ablation;  Surgeon: Marinus Maw, MD;  Location: [facility] INVASIVE CV LAB;  Service: Cardiovascular;  Laterality: N/A;    Allergies  Allergen Reactions  . Ace Inhibitors Other (See Comments)    muscle pain. Tolerates ARBs.   . Codeine Other (See Comments)    "head  wants to explode."  . Doxycycline Diarrhea and Nausea And Vomiting  . Kionex [Sodium Polystyrene Sulfonate] Other (See Comments)    SOB, pressure in chest, weakness fatigue.   [name] Penicillins Swelling    "started at point of injection; w/in 3 min my upper arm was swollen 3 times normal" Has patient had a PCN reaction causing immediate rash, facial/tongue/throat swelling, SOB or lightheadedness with hypotension: Yes Has patient had a PCN reaction causing severe rash involving mucus membranes or skin necrosis: No Has patient had a PCN reaction that required hospitalization No Has patient had a PCN reaction occurring within the last 10 years: No If all of the above answers are "NO", then may proceed wi  . Lisinopril Other (See Comments)    Muscle Pain  . Statins Other (See Comments)    Myalgias per patient  . Hydrocodone Other (See Comments)    Severe headache, esp when combined with skelaxin.    . Lidocaine     Hallucinations, jerking  . Pacerone [Amiodarone] Other (See Comments)    Lung and heart problem (09/16/16 - pt is taking 200 mg daily with no reaction - states reaction was to a higher dose)  . Xanax [Alprazolam] Other (See Comments)    Nightmares.     Current Facility-Administered Medications on File Prior to Encounter  Medication Dose Route Frequency Provider Last Rate Last Dose  . sodium chloride 0.9 % injection 3 mL  3 mL Intravenous Q12H Marinus Maw, MD      . sodium chloride 0.9 % injection 3 mL  3 mL Intravenous PRN Marinus Maw, MD       Current Outpatient Prescriptions on File Prior to Encounter  Medication Sig Dispense Refill  . acetaminophen (TYLENOL) 325 MG tablet Take 2 tablets (650 mg total) by mouth every 6 (six) hours as needed for mild pain (or Fever >/= 101).    [name] amiodarone (PACERONE) 200 MG tablet Take 1 tablet (200 mg total) by mouth daily. 90 tablet 3  . apixaban (ELIQUIS) 2.5 MG TABS tablet Take 1 tablet (2.5 mg total) by mouth 2 (two) times daily. 180  tablet 3  . benzonatate (TESSALON) 200 MG capsule Take 1 capsule (200 mg total) by mouth every 4 (four) hours as needed for cough. 20 capsule 0  . budesonide-formoterol (SYMBICORT) 160-4.5 MCG/ACT inhaler Inhale 2 puffs into the lungs 2 (two) times daily. 1 Inhaler 6  . Chlorpheniramine-APAP (CORICIDIN) 2-325 MG TABS Take 2 tablets by mouth daily as needed (congestion/runny nose).    . ferrous sulfate (FERROUSUL) 325 (65 FE) MG tablet Take 1 tablet (325 mg total) by mouth 2 (two) times daily.    . furosemide (LASIX) 20 MG tablet Take  1 tablet (20 mg total) by mouth every Monday, Wednesday, and Friday. 45 tablet 3  . isosorbide mononitrate (IMDUR) 30 MG 24 hr tablet Take 0.5 tablets (15 mg total) by mouth 2 (two) times daily. 60 tablet 5  . levalbuterol (XOPENEX) 0.63 MG/3ML nebulizer solution Take 0.63 mg by nebulization every 4 (four) hours as needed for wheezing or shortness of breath.    . loperamide (IMODIUM) 2 MG capsule Take 2 capsules (4 mg total) by mouth as needed. (Patient taking differently: Take 2 mg by mouth See admin instructions. Take 1 capsule (2 mg) by mouth after each bowel movement)    . metaxalone (SKELAXIN) 800 MG tablet Take 0.5 tablets (400 mg total) by mouth 3 (three) times daily as needed for muscle spasms.    [name] mexiletine (MEXITIL) 150 MG capsule Take 2 capsules (300 mg total) by mouth every 8 (eight) hours. 8am, 3:30pm, 11:30pm 180 capsule 11  . nitroGLYCERIN (NITROSTAT) 0.4 MG SL tablet PLACE 1 TABLET (0.4 MG TOTAL) UNDER THE TONGUE EVERY 5 (FIVE) MINUTES AS NEEDED. FOR CHEST PAIN. (Patient taking differently: Place 0.4 mg under the tongue every 5 (five) minutes as needed for chest pain. ) 25 tablet 1  . OXYGEN Inhale 2 L into the lungs continuous.    [name] PROAIR HFA 108 (90 Base) MCG/ACT inhaler INHALE 1-2 PUFFS INTO THE LUNGS EVERY 6 (SIX) HOURS AS NEEDED FOR WHEEZING OR SHORTNESS OF BREATH. 8.5 Inhaler 0  . promethazine (PHENERGAN) 25 MG tablet Take 25 mg by mouth every 6  (six) hours as needed for nausea or vomiting.    [name] RANEXA 500 MG 12 hr tablet TAKE 1 TABLET (500 MG TOTAL) BY MOUTH 2 (TWO) TIMES DAILY. 180 tablet 3  . ranitidine (ZANTAC) 150 MG capsule Take 150 mg by mouth daily as needed for heartburn.     . Simethicone (GAS-X PO) Take 1-2 capsules by mouth daily as needed (pressure/bloating/gas). As needed.     . traMADol (ULTRAM) 50 MG tablet Take 1 tablet (50 mg total) by mouth every 8 (eight) hours as needed (for pain). 100 tablet 1  . zolpidem (AMBIEN) 10 MG tablet Take 0.5 tablets (5 mg total) by mouth at bedtime as needed for sleep. (Patient taking differently: Take 5-10 mg by mouth at bedtime. ) 30 tablet 5   Social History   Social History  . Marital status: Married    Spouse name: N/A  . Number of children: 0  . Years of education: N/A   Occupational History  . [employer] truck driver (retired)    Social History Main Topics  . Smoking status: Former Smoker    Packs/day: 0.50    Years: 50.00    Types: Cigarettes, Cigars    Quit date: 09/27/2015  . Smokeless tobacco: Never Used  . Alcohol use No  . Drug use: No  . Sexual activity: Not Currently   Other Topics Concern  . Not on file   Social History Narrative   Lives with wife, married 1998   Grown children, 2 great grandchildren   Occupation: retired, was Presenter, broadcasting   Activity: walking, fishing   Diet: good water daily, fruits/vegetables rare      Wife is Product manager.    [id number], Human resources officer. No known agent orange exposure.      Family History  Problem Relation Age of Onset  . Diabetes Father   . Tracheal cancer Father 35       smoker  . Stroke Mother   .  Cancer Sister        left eye  . CAD Neg Hx   . Colon cancer Neg Hx   . Prostate cancer Neg Hx      ROS:  As stated in the HPI and negative for all other systems.  Physical Exam: Blood pressure 116/73, pulse 75, temperature (!) 97.4 F (36.3 C), temperature source Oral, resp. rate 16, height 5\' 3"  (1.6 m), weight 157  lb (71.2 kg), SpO2 96 %.  GENERAL:  Well appearing HEENT:  Pupils equal round and reactive, fundi not visualized, oral mucosa unremarkable NECK:  No jugular venous distention, waveform within normal limits, carotid upstroke brisk and symmetric, no bruits, no thyromegaly LYMPHATICS:  No cervical, inguinal adenopathy LUNGS:  Diffuse crackles BACK:  No CVA tenderness CHEST:  Well healed left upper ICD pocket.  HEART:  PMI not displaced or sustained,S1 and S2 within normal limits, no S3, no S4, no clicks, no rubs, 2/6 apical systolic murmur at the apex, no diastolilc murmurs ABD:  Flat, positive bowel sounds normal in frequency in pitch, no bruits, no rebound, no guarding, no midline pulsatile mass, no hepatomegaly, no splenomegaly EXT:  2 plus pulses throughout, no edema, no cyanosis no clubbing SKIN:  No rashes no nodules NEURO:  Cranial nerves II through XII grossly intact, motor grossly intact throughout PSYCH:  Cognitively intact, oriented to person place and time   Labs: Lab Results  Component Value Date   BUN 20 09/16/2016   Lab Results  Component Value Date   CREATININE 1.20 09/16/2016   Lab Results  Component Value Date   NA 144 09/16/2016   K 4.4 09/16/2016   CL 108 09/16/2016   CO2 23 09/16/2016   Lab Results  Component Value Date   TROPONINI 0.03 (HH) 07/07/2016   Lab Results  Component Value Date   WBC 7.9 09/16/2016   HGB 13.9 09/16/2016   HCT 41.0 09/16/2016   MCV 85.3 09/16/2016   PLT 351 09/16/2016   Lab Results  Component Value Date   CHOL 170 09/12/2014   HDL 41.10 09/12/2014   LDLCALC 105 (H) 09/12/2014   LDLDIRECT 118.9 08/23/2009   TRIG 117.0 09/12/2014   CHOLHDL 4 09/12/2014   Lab Results  Component Value Date   ALT 6 08/12/2016   AST 11 08/12/2016   ALKPHOS 68 08/12/2016   BILITOT 0.4 08/12/2016   CATH 09/2015   Prox RCA to Mid RCA lesion, 100 %stenosed.  Ost LM to LM lesion, 30 %stenosed.  Mid Cx lesion, 70 %stenosed.  Ost 3rd  Mrg to 3rd Mrg lesion, 75 %stenosed.  Prox LAD lesion, 35 %stenosed.  There is severe left ventricular systolic dysfunction.  LV end diastolic pressure is severely elevated.  The left ventricular ejection fraction is less than 25% by visual estimate.  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   1. 2 vessel obstructive CAD. There is chronic total occlusion of the proximal RCA. There is moderate disease in the distal LCx and third OM. This is unchanged from 2004. There is new aneurysmal dilation of the distal left main. 2. Severe LV dysfunction  3. Marked elevation of the LVEDP.  ECHO  11/2015   - Left ventricle: The cavity size was mildly dilated. Wall   thickness was normal. Systolic function was severely reduced. The   estimated ejection fraction was in the range of 25% to 30%. There   is akinesis of the inferolateral and inferior myocardium.   Features are consistent with  a pseudonormal left ventricular   filling pattern, with concomitant abnormal relaxation and   increased filling pressure (grade 2 diastolic dysfunction).   Doppler parameters are consistent with high ventricular filling   pressure. - Aortic valve: There was mild regurgitation. - Mitral valve: There was severe regurgitation. - Left atrium: The atrium was moderately dilated. - Right atrium: The atrium was moderately dilated. - Tricuspid valve: There was moderate regurgitation. - Pulmonary arteries: Systolic pressure was moderately increased.   Radiology:  CXR:    Cardiomegaly with minimal central congestion. No acute infiltrate or edema.  EKG:  NSR, rate 76, RAD. LPFB.    Unchanged from previous. 09/16/2016  ASSESSMENT AND PLAN:    VTACH:  He has recurrent Vtach.    He has had a history of amiodarone toxicity but had no good options for therapy.  He was placed back on this in June of this year.  He is also on mexilitine.  He was given an IV bolus of amiodarone in the ED when he developed some of the same  symptoms that preceded his ICD firing.   He will be observed overnight.  I will check a magnesium level.  Hold off on increased amio given his significant toxicity in the past.  EP to see in the AM to consider any changes to the ATP thresholds.    CHRONIC SYSTOLIC HF:  Seems to be euvolemic.    He is on chronic O2 therapy.  Continue current therapy.   CAD:  He has had no symptoms consistent with acute ischemic event.  No plans for cath at this point.   ATRIAL FIB:  He is on low dose Eliquis at his request because of skin bleeding.  Dr. [name] is aware.   He is in NSR.    SignedRollene Rotunda 09/16/2016, 9:36 PM

## 2016-09-16 NOTE — ED Notes (Signed)
AICD/pacemaker interrogated.

## 2016-09-16 NOTE — Telephone Encounter (Signed)
Spoke with Mr [name] after reviewing transmission. 8 VT episodes, 3 converted with shocks. I advised [name] to go to the ER d/t his history, GT's latest recommendations (adjust ATP) and our protocol. He verbalizes understanding but is unsure if he will go to the ER. His wife is aware of our recommendations. He knows about driving restrictions x 6 months. He reports taking all of his meds appropriately.

## 2016-09-16 NOTE — Telephone Encounter (Signed)
No ringing- straight to VM on [name]'s #. LM. Spoke with Mrs. [name]- [name] nearby. He reports that his ICD has "gone off" 3 times in the last 24 hours. I let him know that our shock plan advises him to go to the ER after 2 shocks/24 hrs. He verbalizes understanding. He feels fine at present, says he is short-winded prior to each shock. No transmissions received. I advised him to send a transmission now for review. He verbalizes understanding.

## 2016-09-17 ENCOUNTER — Encounter (HOSPITAL_COMMUNITY): Payer: Self-pay | Admitting: *Deleted

## 2016-09-17 DIAGNOSIS — J440 Chronic obstructive pulmonary disease with acute lower respiratory infection: Secondary | ICD-10-CM | POA: Diagnosis present

## 2016-09-17 DIAGNOSIS — E875 Hyperkalemia: Secondary | ICD-10-CM | POA: Diagnosis present

## 2016-09-17 DIAGNOSIS — Z833 Family history of diabetes mellitus: Secondary | ICD-10-CM | POA: Diagnosis not present

## 2016-09-17 DIAGNOSIS — Z7901 Long term (current) use of anticoagulants: Secondary | ICD-10-CM | POA: Diagnosis not present

## 2016-09-17 DIAGNOSIS — Z66 Do not resuscitate: Secondary | ICD-10-CM | POA: Diagnosis present

## 2016-09-17 DIAGNOSIS — Z801 Family history of malignant neoplasm of trachea, bronchus and lung: Secondary | ICD-10-CM | POA: Diagnosis not present

## 2016-09-17 DIAGNOSIS — R531 Weakness: Secondary | ICD-10-CM | POA: Diagnosis not present

## 2016-09-17 DIAGNOSIS — I2510 Atherosclerotic heart disease of native coronary artery without angina pectoris: Secondary | ICD-10-CM | POA: Diagnosis present

## 2016-09-17 DIAGNOSIS — Z880 Allergy status to penicillin: Secondary | ICD-10-CM | POA: Diagnosis not present

## 2016-09-17 DIAGNOSIS — Z9841 Cataract extraction status, right eye: Secondary | ICD-10-CM | POA: Diagnosis not present

## 2016-09-17 DIAGNOSIS — Z9861 Coronary angioplasty status: Secondary | ICD-10-CM | POA: Diagnosis not present

## 2016-09-17 DIAGNOSIS — Z515 Encounter for palliative care: Secondary | ICD-10-CM | POA: Diagnosis not present

## 2016-09-17 DIAGNOSIS — Z823 Family history of stroke: Secondary | ICD-10-CM | POA: Diagnosis not present

## 2016-09-17 DIAGNOSIS — Z7189 Other specified counseling: Secondary | ICD-10-CM

## 2016-09-17 DIAGNOSIS — I252 Old myocardial infarction: Secondary | ICD-10-CM | POA: Diagnosis not present

## 2016-09-17 DIAGNOSIS — Z87891 Personal history of nicotine dependence: Secondary | ICD-10-CM | POA: Diagnosis not present

## 2016-09-17 DIAGNOSIS — I255 Ischemic cardiomyopathy: Secondary | ICD-10-CM | POA: Diagnosis present

## 2016-09-17 DIAGNOSIS — I340 Nonrheumatic mitral (valve) insufficiency: Secondary | ICD-10-CM | POA: Diagnosis not present

## 2016-09-17 DIAGNOSIS — Z885 Allergy status to narcotic agent status: Secondary | ICD-10-CM | POA: Diagnosis not present

## 2016-09-17 DIAGNOSIS — Z95810 Presence of automatic (implantable) cardiac defibrillator: Secondary | ICD-10-CM

## 2016-09-17 DIAGNOSIS — Z79899 Other long term (current) drug therapy: Secondary | ICD-10-CM | POA: Diagnosis not present

## 2016-09-17 DIAGNOSIS — I110 Hypertensive heart disease with heart failure: Secondary | ICD-10-CM | POA: Diagnosis present

## 2016-09-17 DIAGNOSIS — Z9889 Other specified postprocedural states: Secondary | ICD-10-CM | POA: Diagnosis not present

## 2016-09-17 DIAGNOSIS — I428 Other cardiomyopathies: Secondary | ICD-10-CM | POA: Diagnosis not present

## 2016-09-17 DIAGNOSIS — I481 Persistent atrial fibrillation: Secondary | ICD-10-CM | POA: Diagnosis not present

## 2016-09-17 DIAGNOSIS — I5022 Chronic systolic (congestive) heart failure: Secondary | ICD-10-CM | POA: Diagnosis not present

## 2016-09-17 DIAGNOSIS — Z961 Presence of intraocular lens: Secondary | ICD-10-CM | POA: Diagnosis present

## 2016-09-17 DIAGNOSIS — I472 Ventricular tachycardia: Secondary | ICD-10-CM | POA: Diagnosis not present

## 2016-09-17 DIAGNOSIS — Z888 Allergy status to other drugs, medicaments and biological substances status: Secondary | ICD-10-CM | POA: Diagnosis not present

## 2016-09-17 LAB — BASIC METABOLIC PANEL
Anion gap: 9 (ref 5–15)
BUN: 17 mg/dL (ref 6–20)
CALCIUM: 8.4 mg/dL — AB (ref 8.9–10.3)
CO2: 26 mmol/L (ref 22–32)
CREATININE: 1.25 mg/dL — AB (ref 0.61–1.24)
Chloride: 107 mmol/L (ref 101–111)
GFR calc non Af Amer: 56 mL/min — ABNORMAL LOW (ref >60)
GLUCOSE: 111 mg/dL — AB (ref 65–99)
Potassium: 4.2 mmol/L (ref 3.5–5.1)
Sodium: 142 mmol/L (ref 135–145)

## 2016-09-17 LAB — TROPONIN I
TROPONIN I: 0.04 ng/mL — AB (ref <0.03)
TROPONIN I: 0.07 ng/mL — AB (ref <0.03)

## 2016-09-17 LAB — MAGNESIUM: Magnesium: 2.1 mg/dL (ref 1.7–2.4)

## 2016-09-17 LAB — MRSA PCR SCREENING: MRSA by PCR: NEGATIVE

## 2016-09-17 MED ORDER — SIMETHICONE 80 MG PO CHEW
80.0000 mg | CHEWABLE_TABLET | Freq: Four times a day (QID) | ORAL | Status: DC | PRN
  Administered 2016-09-21: 80 mg via ORAL
  Filled 2016-09-17: qty 1

## 2016-09-17 MED ORDER — AMIODARONE HCL IN DEXTROSE 360-4.14 MG/200ML-% IV SOLN
60.0000 mg/h | INTRAVENOUS | Status: DC
  Administered 2016-09-17 (×2): 30 mg/h via INTRAVENOUS
  Administered 2016-09-18 – 2016-09-19 (×2): 60 mg/h via INTRAVENOUS
  Filled 2016-09-17 (×6): qty 200

## 2016-09-17 MED ORDER — ALBUTEROL SULFATE (2.5 MG/3ML) 0.083% IN NEBU
3.0000 mL | INHALATION_SOLUTION | Freq: Four times a day (QID) | RESPIRATORY_TRACT | Status: DC | PRN
  Administered 2016-09-18 – 2016-09-21 (×6): 3 mL via RESPIRATORY_TRACT
  Filled 2016-09-17 (×6): qty 3

## 2016-09-17 MED ORDER — AMIODARONE HCL IN DEXTROSE 360-4.14 MG/200ML-% IV SOLN
60.0000 mg/h | INTRAVENOUS | Status: AC
  Administered 2016-09-17 (×2): 60 mg/h via INTRAVENOUS
  Filled 2016-09-17 (×2): qty 200

## 2016-09-17 MED ORDER — LOPERAMIDE HCL 2 MG PO CAPS
2.0000 mg | ORAL_CAPSULE | ORAL | Status: DC | PRN
  Administered 2016-09-18 – 2016-09-20 (×3): 2 mg via ORAL
  Filled 2016-09-17 (×3): qty 1

## 2016-09-17 MED ORDER — AMIODARONE IV BOLUS ONLY 150 MG/100ML
150.0000 mg | Freq: Once | INTRAVENOUS | Status: AC
  Administered 2016-09-17: 150 mg via INTRAVENOUS

## 2016-09-17 NOTE — Consult Note (Signed)
Advanced Heart Failure Team Consult Note   Primary Physician: Dwana Curd. Para March, MD Primary Cardiologist:  Dr. [name]  Reason for Consultation: Refractory VT in systolic CHF  HPI:    [name] is seen today for evaluation of refractory VT in systolic HF at the request of Dr Graciela Husbands.   [name] is a 71 y.o. male  with Ischemic cardiomyopathy, long history of VT with ICD, s/p ablation and continued VT on amiodarone, Persistent atrial fibrillation,   Pt last seen in Dr. Bruna Potter office 08/27/16. Had 2 ICD shocks in week leading up to that visit. He did see some improvement after VT ablation 04/29/2016 and resumption of amiodarone (previously stopped with concern for amio toxicity)  Admitted 09/16/16 with ICD shock x 3. Transmission notable for 8 episodes of VT. HF asked to see with refractory VT. Pertinent labs on admission include K 4.4, Magnesium 2.1, Creatinine 1.20, Troponin 0.07, Hgb 13.9. CXR with cardiomegaly and minimal central congestion.   At baseline he gets around without too much difficulty. Wears 2L O2 constantly. Does ADLS without too much problem.   Has followed with Dr. Isaiah Serge. No recent PFTs available. Smoked up to 3 ppd for 50 years. Quit 1 year ago.   LHC 09/28/15  Prox RCA to Mid RCA lesion, 100 %stenosed.  Ost LM to LM lesion, 30 %stenosed.  Mid Cx lesion, 70 %stenosed.  Ost 3rd Mrg to 3rd Mrg lesion, 75 %stenosed.  Prox LAD lesion, 35 %stenosed.  There is severe left ventricular systolic dysfunction.  LV end diastolic pressure is severely elevated.  The left ventricular ejection fraction is less than 25% by visual estimate.  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  Echo 11/30/2015 LVEF 25-30%, Grade 2 DD, Mild AI, Severe MR, Mod LAE, Mod RAE, Mod TR  Review of Systems: [y] = yes, [ ]  = no   General: Weight gain [ ] ; Weight loss [ ] ; Anorexia [ ] ; Fatigue [y]; Fever [ ] ; Chills [ ] ; Weakness [ ]   Cardiac: Chest pain/pressure [ ] ;  Resting SOB [ ] ; Exertional SOB [ ] ; Orthopnea [ ] ; Pedal Edema [ ] ; Palpitations [ ] ; Syncope [ ] ; Presyncope [ ] ; Paroxysmal nocturnal dyspnea[ ]   Pulmonary: Cough [ ] ; Wheezing[ ] ; Hemoptysis[ ] ; Sputum [ ] ; Snoring [ ]   GI: Vomiting[ ] ; Dysphagia[ ] ; Melena[ ] ; Hematochezia [ ] ; Heartburn[ ] ; Abdominal pain [ ] ; Constipation [ ] ; Diarrhea [ ] ; BRBPR [ ]   GU: Hematuria[ ] ; Dysuria [ ] ; Nocturia[ ]   Vascular: Pain in legs with walking [ ] ; Pain in feet with lying flat [ ] ; Non-healing sores [ ] ; Stroke [ ] ; TIA [ ] ; Slurred speech [ ] ;  Neuro: Headaches[ ] ; Vertigo[ ] ; Seizures[ ] ; Paresthesias[ ] ;Blurred vision [ ] ; Diplopia [ ] ; Vision changes [ ]   Ortho/Skin: Arthritis [y]; Joint pain [y]; Muscle pain [ ] ; Joint swelling [ ] ; Back Pain [ ] ; Rash [ ]   Psych: Depression[ ] ; Anxiety[ ]   Heme: Bleeding problems [ ] ; Clotting disorders [ ] ; Anemia [ ]   Endocrine: Diabetes [ ] ; Thyroid dysfunction[ ]   Home Medications Prior to Admission medications   Medication Sig Start Date End Date Taking? Authorizing Provider  acetaminophen (TYLENOL) 325 MG tablet Take 2 tablets (650 mg total) by mouth every 6 (six) hours as needed for mild pain (or Fever >/= 101). 12/06/15  Yes Joseph Art, DO  amiodarone (PACERONE) 200 MG tablet Take 1 tablet (200 mg total) by mouth daily.  07/22/16  Yes Marinus Maw, MD  apixaban (ELIQUIS) 2.5 MG TABS tablet Take 1 tablet (2.5 mg total) by mouth 2 (two) times daily. 04/05/16  Yes Marinus Maw, MD  benzonatate (TESSALON) 200 MG capsule Take 1 capsule (200 mg total) by mouth every 4 (four) hours as needed for cough. 12/06/15  Yes Vann, Jessica U, DO  budesonide-formoterol (SYMBICORT) 160-4.5 MCG/ACT inhaler Inhale 2 puffs into the lungs 2 (two) times daily. 08/22/16  Yes Mannam, Praveen, MD  Chlorpheniramine-APAP (CORICIDIN) 2-325 MG TABS Take 2 tablets by mouth daily as needed (congestion/runny nose).   Yes [provider]  ferrous sulfate (FERROUSUL) 325 (65 FE)  MG tablet Take 1 tablet (325 mg total) by mouth 2 (two) times daily. 08/15/16  Yes Joaquim Nam, MD  furosemide (LASIX) 20 MG tablet Take 1 tablet (20 mg total) by mouth every Monday, Wednesday, and Friday. 04/05/16  Yes Marinus Maw, MD  Guaifenesin [facility] MAXIMUM STRENGTH) 1200 MG TB12 Take 1,200 mg by mouth 2 (two) times daily.   Yes [provider]  isosorbide mononitrate (IMDUR) 30 MG 24 hr tablet Take 0.5 tablets (15 mg total) by mouth 2 (two) times daily. 07/16/16  Yes Marinus Maw, MD  levalbuterol Pauline Aus) 0.63 MG/3ML nebulizer solution Take 0.63 mg by nebulization every 4 (four) hours as needed for wheezing or shortness of breath.   Yes [provider]  loperamide (IMODIUM) 2 MG capsule Take 2 capsules (4 mg total) by mouth as needed. Patient taking differently: Take 2 mg by mouth See admin instructions. Take 1 capsule (2 mg) by mouth after each bowel movement 08/12/16  Yes Joaquim Nam, MD  metaxalone (SKELAXIN) 800 MG tablet Take 0.5 tablets (400 mg total) by mouth 3 (three) times daily as needed for muscle spasms. 08/12/16  Yes Joaquim Nam, MD  mexiletine (MEXITIL) 150 MG capsule Take 2 capsules (300 mg total) by mouth every 8 (eight) hours. 8am, 3:30pm, 11:30pm 08/27/16  Yes Marinus Maw, MD  nitroGLYCERIN (NITROSTAT) 0.4 MG SL tablet PLACE 1 TABLET (0.4 MG TOTAL) UNDER THE TONGUE EVERY 5 (FIVE) MINUTES AS NEEDED. FOR CHEST PAIN. Patient taking differently: Place 0.4 mg under the tongue every 5 (five) minutes as needed for chest pain.  08/27/16  Yes Marinus Maw, MD  OXYGEN Inhale 2 L into the lungs continuous.   Yes [provider]  PROAIR HFA 108 (90 Base) MCG/ACT inhaler INHALE 1-2 PUFFS INTO THE LUNGS EVERY 6 (SIX) HOURS AS NEEDED FOR WHEEZING OR SHORTNESS OF BREATH. 08/20/16  Yes Joaquim Nam, MD  promethazine (PHENERGAN) 25 MG tablet Take 25 mg by mouth every 6 (six) hours as needed for nausea or vomiting.   Yes [provider]   RANEXA 500 MG 12 hr tablet TAKE 1 TABLET (500 MG TOTAL) BY MOUTH 2 (TWO) TIMES DAILY. 04/01/16  Yes Seiler, Amber K, NP  ranitidine (ZANTAC) 150 MG capsule Take 150 mg by mouth daily as needed for heartburn.    Yes [provider]  Simethicone (GAS-X PO) Take 1-2 capsules by mouth daily as needed (pressure/bloating/gas). As needed.    Yes [provider]  traMADol (ULTRAM) 50 MG tablet Take 1 tablet (50 mg total) by mouth every 8 (eight) hours as needed (for pain). 05/16/16  Yes Joaquim Nam, MD  zolpidem (AMBIEN) 10 MG tablet Take 0.5 tablets (5 mg total) by mouth at bedtime as needed for sleep. Patient taking differently: Take 5-10 mg by mouth  at bedtime.  08/12/16  Yes Joaquim Nam, MD    Past Medical History: Past Medical History:  Diagnosis Date  . Acute lower GI bleeding 12/11/2011   "first time" (12/11/2011)  . Acute myocardial infarction, unspecified site, episode of care unspecified 1995   Pt living in [place], no stent, ?PTCA  . AICD (automatic cardioverter/defibrillator) present   . Allergic rhinitis, cause unspecified   . Anxiety   . Arthritis    "all over" (11/07/2015)  . Atrial fibrillation (HCC)    a. 05/2015 - converted to sinus in setting of ICD shocks; placed on eliquis 5 bid.  [name] CAD (coronary artery disease), autologous vein bypass graft   . Cervical herniated disc    told not to lift >10 lbs  . Chronic systolic CHF (congestive heart failure), NYHA class 2 (HCC)    Reports EF of 25%.   [name] COPD (chronic obstructive pulmonary disease) (HCC) 11/2012   by xray  . Diverticulosis    by CT scan  . HCAP (healthcare-associated pneumonia) 11/06/2015  . Hypertension   . Insomnia   . Paroxysmal ventricular tachycardia (HCC)   . Perennial allergic rhinitis    only to dust mites  . Pneumonia 2000s   "walking pneumonia"  . VT (ventricular tachycardia) (HCC)    a. 05/2015 - VT storm with multiple ICD shocks-->Amio 400 BID.    Past Surgical  History: Past Surgical History:  Procedure Laterality Date  . CARDIAC CATHETERIZATION  2004   LAD 30%, D1 30%, CFX-AV groove 70-80%, OM1 30%, EF 20-25%  . CARDIAC CATHETERIZATION N/A 09/28/2015   Procedure: Left Heart Cath and Coronary Angiography;  Surgeon: Peter M Swaziland, MD;  [facility] INVASIVE CV LAB;  Service: Cardiovascular;  Laterality: N/A;  . CARDIAC DEFIBRILLATOR PLACEMENT  2004  . CATARACT EXTRACTION W/ INTRAOCULAR LENS IMPLANT Right 01/2012  . COLONOSCOPY  01/08/2012   Procedure: COLONOSCOPY;  Surgeon: Iva Boop, MD;  Location: [facility] ENDOSCOPY;  Service: Endoscopy;  Laterality: N/A;  . CORONARY ANGIOPLASTY  1995   Pt thinks he got a balloon, living in [place], [place]  . IMPLANTABLE CARDIOVERTER DEFIBRILLATOR GENERATOR CHANGE N/A 02/07/2012   Procedure: IMPLANTABLE CARDIOVERTER DEFIBRILLATOR GENERATOR CHANGE;  Surgeon: Marinus Maw, MD; Medtronic Evera XT VR single-chamber serial number [id number], Laterality: Left  . INSERT / REPLACE / REMOVE PACEMAKER  2004   Medtronic ICD  . KNEE ARTHROSCOPY Left 05/2003   Hattie Perch 06/19/2010  . LAPAROSCOPIC CHOLECYSTECTOMY  1/ 2012  . SHOULDER ARTHROSCOPY W/ ROTATOR CUFF REPAIR Right twice  . TONSILLECTOMY AND ADENOIDECTOMY  ~ 1951  . V TACH ABLATION N/A 04/29/2016   Procedure: V Tach Ablation;  Surgeon: Marinus Maw, MD;  Location: [facility] INVASIVE CV LAB;  Service: Cardiovascular;  Laterality: N/A;    Family History: Family History  Problem Relation Age of Onset  . Diabetes Father   . Tracheal cancer Father 73       smoker  . Stroke Mother   . Cancer Sister        left eye  . CAD Neg Hx   . Colon cancer Neg Hx   . Prostate cancer Neg Hx     Social History: Social History   Social History  . Marital status: Married    Spouse name: N/A  . Number of children: 0  . Years of education: N/A   Occupational History  . [employer] truck driver (retired)    Social History Main Topics  . Smoking status: Former Smoker  Packs/day: 0.50     Years: 50.00    Types: Cigarettes, Cigars    Quit date: 09/27/2015  . Smokeless tobacco: Never Used  . Alcohol use No  . Drug use: No  . Sexual activity: Not Currently   Other Topics Concern  . None   Social History Narrative   Lives with wife, married 1998   Grown children, 2 great grandchildren   Occupation: retired, was Presenter, broadcasting   Activity: walking, fishing   Diet: good water daily, fruits/vegetables rare      Wife is Product manager.    [id number], Human resources officer. No known agent orange exposure.      Allergies:  Allergies  Allergen Reactions  . Ace Inhibitors Other (See Comments)    muscle pain. Tolerates ARBs.   . Codeine Other (See Comments)    "head wants to explode."  . Doxycycline Diarrhea and Nausea And Vomiting  . Kionex [Sodium Polystyrene Sulfonate] Other (See Comments)    SOB, pressure in chest, weakness fatigue.   [name] Penicillins Swelling    "started at point of injection; w/in 3 min my upper arm was swollen 3 times normal" Has patient had a PCN reaction causing immediate rash, facial/tongue/throat swelling, SOB or lightheadedness with hypotension: Yes Has patient had a PCN reaction causing severe rash involving mucus membranes or skin necrosis: No Has patient had a PCN reaction that required hospitalization No Has patient had a PCN reaction occurring within the last 10 years: No If all of the above answers are "NO", then may proceed wi  . Lisinopril Other (See Comments)    Muscle Pain  . Statins Other (See Comments)    Myalgias per patient  . Hydrocodone Other (See Comments)    Severe headache, esp when combined with skelaxin.    . Lidocaine     Hallucinations, jerking  . Pacerone [Amiodarone] Other (See Comments)    Lung and heart problem (09/16/16 - pt is taking 200 mg daily with no reaction - states reaction was to a higher dose)  . Xanax [Alprazolam] Other (See Comments)    Nightmares.      Objective:    Vital Signs:   Temp:  [97.4 F (36.3 C)-98.6  F (37 C)] 97.7 F (36.5 C) (08/14 0737) Pulse Rate:  [59-133] 67 (08/14 0737) Resp:  [10-19] 17 (08/14 0737) BP: (101-123)/(61-99) 116/83 (08/14 0737) SpO2:  [94 %-99 %] 96 % (08/14 0737) Weight:  [150 lb (68 kg)-157 lb (71.2 kg)] 150 lb (68 kg) (08/14 0633) Last BM Date: 09/16/16  Weight change: Filed Weights   09/16/16 2006 09/17/16 0633  Weight: 157 lb (71.2 kg) 150 lb (68 kg)    Intake/Output:   Intake/Output Summary (Last 24 hours) at 09/17/16 1042 Last data filed at 09/17/16 0551  Gross per 24 hour  Intake                0 ml  Output              100 ml  Net             -100 ml    Physical Exam    General:  Well appearing. No resp difficulty HEENT: normal Neck: supple. JVP 6 . Carotids 2+ bilat; no bruits. No lymphadenopathy or thyromegaly appreciated. Cor: PMI nondisplaced. Regular rate & rhythm. 2/6 MR no s3 Lungs: clear with decreased breath sounds throughout Abdomen: soft, nontender, nondistended. No hepatosplenomegaly. No bruits or masses. Good bowel sounds. Extremities: no cyanosis, clubbing,  rash, edema Neuro: alert & orientedx3, cranial nerves grossly intact. moves all 4 extremities w/o difficulty. Affect pleasant   Telemetry   NSR Personally reviewed   EKG    Personally reviewed, NSR 74 bpm, QTc 532 bpm  Labs   Basic Metabolic Panel:  Recent Labs Lab 09/16/16 2011 09/16/16 2039 09/16/16 2102  NA 141 144  --   K 4.4 4.4  --   CL 109 108  --   CO2 23  --   --   GLUCOSE 150* 148*  --   BUN 17 20  --   CREATININE 1.30* 1.20  --   CALCIUM 8.6*  --   --   MG  --   --  2.1    Liver Function Tests: No results for input(s): AST, ALT, ALKPHOS, BILITOT, PROT, ALBUMIN in the last 168 hours. No results for input(s): LIPASE, AMYLASE in the last 168 hours. No results for input(s): AMMONIA in the last 168 hours.  CBC:  Recent Labs Lab 09/16/16 2011 09/16/16 2039  WBC 7.9  --   HGB 12.3* 13.9  HCT 40.5 41.0  MCV 85.3  --   PLT 351  --      Cardiac Enzymes:  Recent Labs Lab 09/17/16 0040 09/17/16 0622  TROPONINI 0.04* 0.07*    BNP: BNP (last 3 results)  Recent Labs  11/28/15 0737 03/10/16 0004 06/19/16 1857  BNP 494.0* 165.6* 322.2*    ProBNP (last 3 results) No results for input(s): PROBNP in the last 8760 hours.   CBG: No results for input(s): GLUCAP in the last 168 hours.  Coagulation Studies: No results for input(s): LABPROT, INR in the last 72 hours.   Imaging   Dg Chest Portable 1 View  Result Date: 09/16/2016 CLINICAL DATA:  ICD fired EXAM: PORTABLE CHEST 1 VIEW COMPARISON:  07/07/2016 FINDINGS: Left-sided pacing device similar compared to prior. Moderate cardiomegaly with mild central congestion. No focal consolidation or large effusion. No pneumothorax. IMPRESSION: Cardiomegaly with minimal central congestion. No acute infiltrate or edema. Electronically Signed   By: Jasmine Pang M.D.   On: 09/16/2016 21:20      Medications:     Current Medications: . apixaban  2.5 mg Oral BID  . famotidine  10 mg Oral Daily  . ferrous sulfate  325 mg Oral BID  . [START ON 09/18/2016] furosemide  20 mg Oral Q M,W,F  . guaiFENesin  1,200 mg Oral BID  . mexiletine  300 mg Oral 3 times per day  . mometasone-formoterol  2 puff Inhalation BID  . ranolazine  500 mg Oral BID     Infusions: . amiodarone 30 mg/hr (09/17/16 0715)  . amiodarone         Patient Profile   [name] is a 71 y.o. male with Ischemic cardiomyopathy, long history of VT with ICD, s/p ablation and continued VT on amiodarone.   Admitted 09/16/16 with ICD shock x 3. Transmission notable for 8 episodes of VT. HF asked to see with refractory VT.   Assessment/Plan   1. Refractory VT - s/p VT ablation 04/2016 - s/p ICD - Continue amio gtt - Continue mexitil 300 mg q 8 hrs - Continue ranexa 500 mg BID.  2. Chronic systolic CHF due to ICM - Echo 11/2015 LVEF 25-30%. Repeat Echo - Continue imdur 15 mg BID - Pt  intolerant to ACEi with muscle pain. Tolerates ARBs, but not on prior to admit.  - Avoiding BB with bradycardia and attempt to  avoid pacing with single lead device.  3. CAD - Last cath 09/2015 with 2 vessel obstructive CAD, total occlusion of proximal RCA and moderate disease in the distal LCx and third OM. Relatively unchanged per notes from cath in 2004. 4. Afib, paroxysmal - Continue low dose eliquis ( Dose decrease requested by patient with frequent skin bleeding - He is currently in NSR - CHA2DS2/VASc is 5.   5. Chronic respiratory failure on home O2 due to COPD/Pulmoanry fibrosis - Most recent Spirometry: 08/25/2008: FEV1% = 64% , FEV1 = 89 % predicted -> mild obstruction. - He has had several sets of PFTs ordered, but not performed.  - > 50 pack year history.   Pt would not be a candidate for transplant with advanced age and lung disease. He is limited from an LVAD perspective for the same reasons. Repeat Echo. Needs PFTs.    Length of Stay: 0  Luane School  09/17/2016, 10:42 AM  Advanced Heart Failure Team Pager [phone] (M-F; 7a - 4p)  Please contact [facility] for night-coverage after hours (4p -7a ) and weekends on [email or web address]  Patient seen and examined with the above-signed Advanced Practice Provider and/or Housestaff. I personally reviewed laboratory data, imaging studies and relevant notes. I independently examined the patient and formulated the important aspects of the plan. I have edited the note to reflect any of my changes or salient points. I have personally discussed the plan with the patient and/or family.  71 y/o male with iCM EF 25-30% presents with recurrent VT strom despite previous VT ablation and AA therapy.   Echo, cardiac cath and recent chest CT images reviewed personally.   We are asked by Dr. Graciela Husbands to consider whether or not he would be a candidate for advanced therapies to address his recurrent VT storm.  Unfortunately, given his age,  transplant would be completely off the table. We did discuss the possibility of VAD therapy but I think his degree of pulmonary disease would preclude this especially in light of the fact that VAD therapy may not necessarily resolve his VT issues.   I am sorry we do not have more to offer. Please reconsult as needed. D/w Dr. Graciela Husbands.   Arvilla Meres, MD  12:22 PM

## 2016-09-17 NOTE — Telephone Encounter (Signed)
Pharmacy states they do not have this Rx.  Pharmacy states that the last refill they provided was in April of 2018.  Is it okay to refill?

## 2016-09-17 NOTE — Telephone Encounter (Signed)
Verify with pharmacy. Should have refills. Thanks.

## 2016-09-17 NOTE — Progress Notes (Signed)
MD called back and will be up to see patient and he would have EP come see patient.

## 2016-09-17 NOTE — Plan of Care (Signed)
Problem: Education: Goal: Knowledge of [facility] Education information/materials will improve Outcome: Progressing Discussed with patient about plan of care for the unit and admission questions with teach back displayed

## 2016-09-17 NOTE — Progress Notes (Signed)
Patient would like Protestant communion once wife arrives after 4PM today.  This Chaplain will check in on patient around 4:30PM today and administer communion for him and his wife.    09/17/16 1220  Clinical Encounter Type  Visited With Patient  Visit Type Follow-up;Psychological support;Spiritual support  Referral From Chaplain  Consult/Referral To Chaplain  Spiritual Encounters  Spiritual Needs Ritual

## 2016-09-17 NOTE — Discharge Instructions (Signed)

## 2016-09-17 NOTE — Consult Note (Signed)
Cardiology Consultation:   Patient ID: [name]; [id number]; Jan 04, 1946   Admit date: 09/16/2016 Date of Consult: 09/17/2016  Primary Care Provider: Joaquim Nam, MD Primary Cardiologist/Primary Electrophysiologist:  Dr. [name]   Patient Profile:   [name] is a 71 y.o. male with a hx of  CAD, mostly NICM w remote T RCA , lengthy hs of VT/VT storms w/ICD, persisitent A Fib, ICM s/p AICD, COPD, anxiety disorder, GIB, multiple hospitalizations for HCAP > ultimately felt to have developed pulm amio toxicity and remains chronically on O2 at home @ 2L who is being seen today for the evaluation of recurrent VT/VT storm at the request of [name].  History of Present Illness:   [name] denies any particular CP or anginal symptoms of late, though when in VT he feels SOB and very anxious knowing he may get shocked.  Yesterday he called the office reporting 3 shocks, transmission confirmed 8 VT episodes and was advised to go to the ER.  No reports of syncope.  He most recently saw Dr. [name] last month noting he had more VT and was continued on his amiodarone (described as a last ditch effort to control his VT)  LABS K+ 4.4 Mag 2.1 BUN/Creat 20/1.07 poc Trop 0.00 Trop I: 0.04, 0.07 WBC 7.9 H/H 13/41 plts 351   Device information/history: MDT single chamber ICD, implanted 08/26/02, Dr. [name], VT 11/29/15 started on mexiletine/Ranexa 11/28/15: VT which degenerated into VF with ATP treatment and he received ICD shock 09/27/15: VT episode that he received 2 ATP therapies for and 1 shock, a number of NSVT episodes, and another VT episode he received ATP for that slowed the tachycardia. Morphology appears that he has 2 different VT's VT storm July 2017 VT storm in April 2017 VT w/shocks May 2018 AAD tx:  amiodarone loaded/started July 2017 discontinued 2/2 suspect lung tox >> Mexiletine and Ranexa Hallucinations with lidocaine Feb 2018  Quinidine Gluconate stopped ?  GI intolerance May 2018 Sotalol added June 2018 recurrent VT >> sotalol stopped and amiodarone restarted  Currently out patient AAD tx: Amiodarone 200mg  daily Mexiletine 300mg  TID Ranexa 500mg  BID  Past Medical History:  Diagnosis Date  . Acute lower GI bleeding 12/11/2011   "first time" (12/11/2011)  . Acute myocardial infarction, unspecified site, episode of care unspecified 1995   Pt living in [place], no stent, ?PTCA  . AICD (automatic cardioverter/defibrillator) present   . Allergic rhinitis, cause unspecified   . Anxiety   . Arthritis    "all over" (11/07/2015)  . Atrial fibrillation (HCC)    a. 05/2015 - converted to sinus in setting of ICD shocks; placed on eliquis 5 bid.  [name] CAD (coronary artery disease), autologous vein bypass graft   . Cervical herniated disc    told not to lift >10 lbs  . Chronic systolic CHF (congestive heart failure), NYHA class 2 (HCC)    Reports EF of 25%.   [name] COPD (chronic obstructive pulmonary disease) (HCC) 11/2012   by xray  . Diverticulosis    by CT scan  . HCAP (healthcare-associated pneumonia) 11/06/2015  . Hypertension   . Insomnia   . Paroxysmal ventricular tachycardia (HCC)   . Perennial allergic rhinitis    only to dust mites  . Pneumonia 2000s   "walking pneumonia"  . VT (ventricular tachycardia) (HCC)    a. 05/2015 - VT storm with multiple ICD shocks-->Amio 400 BID.    Past Surgical History:  Procedure Laterality Date  . CARDIAC  CATHETERIZATION  2004   LAD 30%, D1 30%, CFX-AV groove 70-80%, OM1 30%, EF 20-25%  . CARDIAC CATHETERIZATION N/A 09/28/2015   Procedure: Left Heart Cath and Coronary Angiography;  Surgeon: Peter M Swaziland, MD;  Location: [facility] INVASIVE CV LAB;  Service: Cardiovascular;  Laterality: N/A;  . CARDIAC DEFIBRILLATOR PLACEMENT  2004  . CATARACT EXTRACTION W/ INTRAOCULAR LENS IMPLANT Right 01/2012  . COLONOSCOPY  01/08/2012   Procedure: COLONOSCOPY;  Surgeon: Iva Boop, MD;  Location: [facility] ENDOSCOPY;  Service:  Endoscopy;  Laterality: N/A;  . CORONARY ANGIOPLASTY  1995   Pt thinks he got a balloon, living in [place], [place]  . IMPLANTABLE CARDIOVERTER DEFIBRILLATOR GENERATOR CHANGE N/A 02/07/2012   Procedure: IMPLANTABLE CARDIOVERTER DEFIBRILLATOR GENERATOR CHANGE;  Surgeon: Marinus Maw, MD; Medtronic Evera XT VR single-chamber serial number [id number], Laterality: Left  . INSERT / REPLACE / REMOVE PACEMAKER  2004   Medtronic ICD  . KNEE ARTHROSCOPY Left 05/2003   Hattie Perch 06/19/2010  . LAPAROSCOPIC CHOLECYSTECTOMY  1/ 2012  . SHOULDER ARTHROSCOPY W/ ROTATOR CUFF REPAIR Right twice  . TONSILLECTOMY AND ADENOIDECTOMY  ~ 1951  . V TACH ABLATION N/A 04/29/2016   Procedure: V Tach Ablation;  Surgeon: Marinus Maw, MD;  Location: [facility] INVASIVE CV LAB;  Service: Cardiovascular;  Laterality: N/A;     Inpatient Medications: Scheduled Meds: . apixaban  2.5 mg Oral BID  . famotidine  10 mg Oral Daily  . ferrous sulfate  325 mg Oral BID  . [START ON 09/18/2016] furosemide  20 mg Oral Q M,W,F  . guaiFENesin  1,200 mg Oral BID  . mexiletine  300 mg Oral 3 times per day  . mometasone-formoterol  2 puff Inhalation BID  . ranolazine  500 mg Oral BID   Continuous Infusions: . amiodarone 30 mg/hr (09/17/16 0715)   PRN Meds: diphenhydrAMINE **AND** acetaminophen, acetaminophen, albuterol, benzonatate, loperamide, metaxalone, ondansetron (ZOFRAN) IV, promethazine, traMADol, zolpidem  Allergies:    Allergies  Allergen Reactions  . Ace Inhibitors Other (See Comments)    muscle pain. Tolerates ARBs.   . Codeine Other (See Comments)    "head wants to explode."  . Doxycycline Diarrhea and Nausea And Vomiting  . Kionex [Sodium Polystyrene Sulfonate] Other (See Comments)    SOB, pressure in chest, weakness fatigue.   [name] Penicillins Swelling    "started at point of injection; w/in 3 [name] my upper arm was swollen 3 times normal" Has patient had a PCN reaction causing immediate rash, facial/tongue/throat swelling,  SOB or lightheadedness with hypotension: Yes Has patient had a PCN reaction causing severe rash involving mucus membranes or skin necrosis: No Has patient had a PCN reaction that required hospitalization No Has patient had a PCN reaction occurring within the last 10 years: No If all of the above answers are "NO", then may proceed wi  . Lisinopril Other (See Comments)    Muscle Pain  . Statins Other (See Comments)    Myalgias per patient  . Hydrocodone Other (See Comments)    Severe headache, esp when combined with skelaxin.    . Lidocaine     Hallucinations, jerking  . Pacerone [Amiodarone] Other (See Comments)    Lung and heart problem (09/16/16 - pt is taking 200 mg daily with no reaction - states reaction was to a higher dose)  . Xanax [Alprazolam] Other (See Comments)    Nightmares.      Social History:   Social History   Social History  . Marital status:  Married    Spouse name: N/A  . Number of children: 0  . Years of education: N/A   Occupational History  . [employer] truck driver (retired)    Social History Main Topics  . Smoking status: Former Smoker    Packs/day: 0.50    Years: 50.00    Types: Cigarettes, Cigars    Quit date: 09/27/2015  . Smokeless tobacco: Never Used  . Alcohol use No  . Drug use: No  . Sexual activity: Not Currently   Other Topics Concern  . Not on file   Social History Narrative   Lives with wife, married 1998   Grown children, 2 great grandchildren   Occupation: retired, was Presenter, broadcasting   Activity: walking, fishing   Diet: good water daily, fruits/vegetables rare      Wife is Product manager.    [id number], Human resources officer. No known agent orange exposure.      Family History:   Family History  Problem Relation Age of Onset  . Diabetes Father   . Tracheal cancer Father 66       smoker  . Stroke Mother   . Cancer Sister        left eye  . CAD Neg Hx   . Colon cancer Neg Hx   . Prostate cancer Neg Hx      ROS:  Please see the history of  present illness.  ROS  All other ROS reviewed and negative.     Physical Exam/Data:   Vitals:   09/17/16 0530 09/17/16 0600 09/17/16 0633 09/17/16 0737  BP: 105/80 104/76 116/85 116/83  Pulse: (!) 59 (!) 59 68 67  Resp: 10 10 18 17   Temp:   98.6 F (37 C) 97.7 F (36.5 C)  TempSrc:   Oral Oral  SpO2: 98% 97% 94% 96%  Weight:   150 lb (68 kg)   Height:   5\' 3"  (1.6 m)     Intake/Output Summary (Last 24 hours) at 09/17/16 0947 Last data filed at 09/17/16 0551  Gross per 24 hour  Intake                0 ml  Output              100 ml  Net             -100 ml   Filed Weights   09/16/16 2006 09/17/16 0633  Weight: 157 lb (71.2 kg) 150 lb (68 kg)   Body mass index is 26.57 kg/m.  General:  Well nourished, well developed, chronically ill appearing though in no acute distress HEENT: normal Lymph: no adenopathy Neck: no JVD Endocrine:  No thryomegaly Vascular: No carotid bruits Cardiac:  RRR; 1/6 SM Lungs:  very fine crackles, slightly diminished at the bases, no wheezing, rhonchi or rales  Abd: soft, nontender Ext: no edema Musculoskeletal:  No deformities, BUE and BLE strength normal and equal Skin: warm and dry  Neuro:  no focal abnormalities noted Psych:  Normal affect   EKG:  The EKG was personally reviewed and demonstrates:  SR, RBBB, LPFB Telemetry:  Telemetry was personally reviewed and demonstrates: currently SB/SR 55-60's, he has had a number of sustained, slower VT episodes 120's-130's, earlier this AM FVT treated with ATP accelerated to another VT and was successfully shocked  Relevant CV Studies:  11/30/15: TTE Study Conclusions - Left ventricle: The cavity size was mildly dilated. Wall thickness was normal. Systolic function was severely reduced. The estimated ejection  fraction was in the range of 25% to 30%. There is akinesis of the inferolateral and inferior myocardium. Features are consistent with a pseudonormal left ventricular filling  pattern, with concomitant abnormal relaxation and increased filling pressure (grade 2 diastolic dysfunction). Doppler parameters are consistent with high ventricular filling pressure. - Aortic valve: There was mild regurgitation. - Mitral valve: There was severe regurgitation. - Left atrium: The atrium was moderately dilated. - Right atrium: The atrium was moderately dilated. - Tricuspid valve: There was moderate regurgitation. - Pulmonary arteries: Systolic pressure was moderately increased. Impressions: - Definity used; akinesis of the inferior and inferior lateral wall with overall severely reduced LV systolic function; grade 2 diastolic dysfunction with elevated LV filling pressure; biatrial enlargement; mild AI; severe MR; moderate TR with moderately elevated pulmonary pressure.  09/28/15: LHC Conclusion  Prox RCA to Mid RCA lesion, 100 %stenosed.  Ost LM to LM lesion, 30 %stenosed.  Mid Cx lesion, 70 %stenosed.  Ost 3rd Mrg to 3rd Mrg lesion, 75 %stenosed.  Prox LAD lesion, 35 %stenosed.  There is severe left ventricular systolic dysfunction.  LV end diastolic pressure is severely elevated.  The left ventricular ejection fraction is less than 25% by visual estimate.  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 1. 2 vessel obstructive CAD. There is chronic total occlusion of the proximal RCA. There is moderate disease in the distal LCx and third OM. This is unchanged from 2004. There is new aneurysmal dilation of the distal left main. 2. Severe LV dysfunction  3. Marked elevation of the LVEDP. Plan: continue medical therapy. He may benefit from additional diuresis.      Laboratory Data:  Chemistry Recent Labs Lab 09/16/16 2011 09/16/16 2039  NA 141 144  K 4.4 4.4  CL 109 108  CO2 23  --   GLUCOSE 150* 148*  BUN 17 20  CREATININE 1.30* 1.20  CALCIUM 8.6*  --   GFRNONAA 53*  --   GFRAA >60  --   ANIONGAP 9  --     No results for  input(s): PROT, ALBUMIN, AST, ALT, ALKPHOS, BILITOT in the last 168 hours. Hematology Recent Labs Lab 09/16/16 2011 09/16/16 2039  WBC 7.9  --   RBC 4.75  --   HGB 12.3* 13.9  HCT 40.5 41.0  MCV 85.3  --   MCH 25.9*  --   MCHC 30.4  --   RDW 22.4*  --   PLT 351  --    Cardiac Enzymes Recent Labs Lab 09/17/16 0040 09/17/16 0622  TROPONINI 0.04* 0.07*    Recent Labs Lab 09/16/16 2032  TROPIPOC 0.00    BNPNo results for input(s): BNP, PROBNP in the last 168 hours.  DDimer No results for input(s): DDIMER in the last 168 hours.  Radiology/Studies:  Dg Chest Portable 1 View Result Date: 09/16/2016 CLINICAL DATA:  ICD fired EXAM: PORTABLE CHEST 1 VIEW COMPARISON:  07/07/2016 FINDINGS: Left-sided pacing device similar compared to prior. Moderate cardiomegaly with mild central congestion. No focal consolidation or large effusion. No pneumothorax. IMPRESSION: Cardiomegaly with minimal central congestion. No acute infiltrate or edema. Electronically Signed   By: Jasmine Pang M.D.   On: 09/16/2016 21:20    Assessment and Plan:   1. VT storm, 3 shocks at home, one here, as well as slower VT episodes below detection     Long hx of VT storms, now what appears to be refractory and back on amiodarone described by Dr. [name] as a last ditch effort  to try and control his VT  Very difficult position given need for amiodarone and known pulmonary toxicity issues, now chronically on Oxygen.  Dr. Graciela Husbands had lengthy discussion with the patient, will ask CHF team to see the patient and any options/ideas they may have.  The patient would like to have chaplain services comes by. Also discussed palliative care consult as well.  ICD carelink Yesterday he had 4 shocks, 15 ATP therapies, all for VT Battery and lead status stable  2. CAD     No anginal complaints      LHC last year is noted above, no disease felt to be VT trigger     "statin" allergy, and no ASA w/Eliquis      Currently HR 50's,  will hold off BB to avoid pacing (single lead device)  3. Chronic SOB, worsened when in VT     Chronic O2 needs     COPD, amiodarone pulmonary toxicity     O2 sat looks ok      4. NICM     CHF team will see     He does not appear overtly overloaded     BP has limited meds intermittently   5. PAFib      Continue Eliquis for CHADS2VASC of 3 (patient insists on low dose, declines 5mg )  6. CAD  Cath 8/17  Nothing to revascularize  T RCA  Signed, Sheilah Pigeon, PA-C  09/17/2016 9:47 AM   A terrible situationj  Refractory VT and recurrent storm with concerns of amio toxicity  Now w O2 dependent lung disease The arrhtyhmia treatment options are exhausted, although for now we will rebolus with amiodarone realizing that there is risk for lung toxicity.  I have reached out to CHF team as to whether he may be a candidate for advanced therapies; I and they presume not, but will come by to look some more  We have also talked as to how we care for him with increasaingly fewer medical options.  He is open to palliative care consultation for decision making  He has expressed willingness for example to be on vent for short term support.  He has also asked for chaplaincy

## 2016-09-17 NOTE — ED Notes (Addendum)
Pt's HR at 230, states his AICD shocked him, HR at 132. Pt c/o chest pressure and shortness of breath prior to shock. EDP at the bedside, admitting MD paged.

## 2016-09-17 NOTE — Progress Notes (Signed)
Patient c/o chest discomfort. Patient in v-tach. Notified Dr. Antoine Poche.

## 2016-09-18 ENCOUNTER — Inpatient Hospital Stay (HOSPITAL_COMMUNITY): Payer: Medicare Other

## 2016-09-18 DIAGNOSIS — I340 Nonrheumatic mitral (valve) insufficiency: Secondary | ICD-10-CM

## 2016-09-18 LAB — BASIC METABOLIC PANEL
ANION GAP: 9 (ref 5–15)
BUN: 17 mg/dL (ref 6–20)
CALCIUM: 8.6 mg/dL — AB (ref 8.9–10.3)
CHLORIDE: 106 mmol/L (ref 101–111)
CO2: 24 mmol/L (ref 22–32)
Creatinine, Ser: 1.36 mg/dL — ABNORMAL HIGH (ref 0.61–1.24)
GFR calc non Af Amer: 50 mL/min — ABNORMAL LOW (ref >60)
GFR, EST AFRICAN AMERICAN: 58 mL/min — AB (ref >60)
GLUCOSE: 131 mg/dL — AB (ref 65–99)
POTASSIUM: 5.1 mmol/L (ref 3.5–5.1)
Sodium: 139 mmol/L (ref 135–145)

## 2016-09-18 LAB — CBC WITH DIFFERENTIAL/PLATELET
BASOS PCT: 0 %
Basophils Absolute: 0 10*3/uL (ref 0.0–0.1)
EOS ABS: 0.4 10*3/uL (ref 0.0–0.7)
EOS PCT: 4 %
HEMATOCRIT: 40.8 % (ref 39.0–52.0)
HEMOGLOBIN: 12.3 g/dL — AB (ref 13.0–17.0)
LYMPHS PCT: 26 %
Lymphs Abs: 2.4 10*3/uL (ref 0.7–4.0)
MCH: 26 pg (ref 26.0–34.0)
MCHC: 30.1 g/dL (ref 30.0–36.0)
MCV: 86.3 fL (ref 78.0–100.0)
Monocytes Absolute: 0.5 10*3/uL (ref 0.1–1.0)
Monocytes Relative: 6 %
NEUTROS ABS: 5.8 10*3/uL (ref 1.7–7.7)
Neutrophils Relative %: 64 %
Platelets: 302 10*3/uL (ref 150–400)
RBC: 4.73 MIL/uL (ref 4.22–5.81)
RDW: 22.1 % — ABNORMAL HIGH (ref 11.5–15.5)
WBC: 9.1 10*3/uL (ref 4.0–10.5)

## 2016-09-18 LAB — ECHOCARDIOGRAM COMPLETE
Height: 63 in
Weight: 2400 oz

## 2016-09-18 MED ORDER — PERFLUTREN LIPID MICROSPHERE
1.0000 mL | INTRAVENOUS | Status: AC | PRN
  Administered 2016-09-18: 2 mL via INTRAVENOUS
  Filled 2016-09-18: qty 10

## 2016-09-18 MED ORDER — AMIODARONE LOAD VIA INFUSION
150.0000 mg | Freq: Once | INTRAVENOUS | Status: AC
  Administered 2016-09-18: 150 mg via INTRAVENOUS
  Filled 2016-09-18: qty 83.34

## 2016-09-18 MED ORDER — AMIODARONE IV BOLUS ONLY 150 MG/100ML
150.0000 mg | Freq: Once | INTRAVENOUS | Status: DC
  Filled 2016-09-18: qty 100

## 2016-09-18 NOTE — Care Management Note (Signed)
Case Management Note  Patient Details  Name: [name] MRN: [id number] Date of Birth: 1946/01/24  Subjective/Objective:   From home with wife, presents with ICD discharge 4 times,  conts on amio drip.                Action/Plan: NCM will follow for dc needs.  Expected Discharge Date:  09/19/16               Expected Discharge Plan:     In-House Referral:     Discharge planning Services  CM Consult  Post Acute Care Choice:    Choice offered to:     DME Arranged:    DME Agency:     HH Arranged:    HH Agency:     Status of Service:  In process, will continue to follow  If discussed at Long Length of Stay Meetings, dates discussed:    Additional Comments:  Leone Haven, RN 09/18/2016, 4:00 PM

## 2016-09-18 NOTE — Progress Notes (Signed)
  Echocardiogram 2D Echocardiogram with Definity has been performed.  [name] 09/18/2016, 4:31 PM

## 2016-09-18 NOTE — Progress Notes (Signed)
Md paged regarding patient's HR ranging from 45-low 50s after amiodarone gtt was increased and bolus ordered. There are no parameters to the amiodarone gtt, as HR was in 50s when MD rounded. RN will continue to monitor

## 2016-09-18 NOTE — Progress Notes (Signed)
Progress Note  Patient Name: [name] Date of Encounter: 09/18/2016  Primary Cardiologist: Dr. [name]  Subjective   Feels fairly well this morning, no new complaints, less SOB, no CP or palpitations Minute with heart failure service yesterday as well as palliative care-there note is not available   He thought that visit was helpful  Inpatient Medications    Scheduled Meds: . apixaban  2.5 mg Oral BID  . famotidine  10 mg Oral Daily  . ferrous sulfate  325 mg Oral BID  . furosemide  20 mg Oral Q M,W,F  . guaiFENesin  1,200 mg Oral BID  . mexiletine  300 mg Oral 3 times per day  . mometasone-formoterol  2 puff Inhalation BID  . ranolazine  500 mg Oral BID   Continuous Infusions: . amiodarone 30 mg/hr (09/17/16 2356)   PRN Meds: diphenhydrAMINE **AND** acetaminophen, acetaminophen, albuterol, benzonatate, loperamide, metaxalone, ondansetron (ZOFRAN) IV, promethazine, simethicone, traMADol, zolpidem   Vital Signs    Vitals:   09/17/16 2323 09/18/16 0200 09/18/16 0431 09/18/16 0720  BP: 114/85  98/86 101/72  Pulse: 64  66   Resp: (!) 21  (!) 21   Temp: (!) 97.3 F (36.3 C) 97.9 F (36.6 C) 97.8 F (36.6 C) 98.4 F (36.9 C)  TempSrc: Axillary Axillary Axillary Axillary  SpO2: 90%  91%   Weight:      Height:        Intake/Output Summary (Last 24 hours) at 09/18/16 0730 Last data filed at 09/18/16 1749  Gross per 24 hour  Intake                0 ml  Output              700 ml  Net             -700 ml   Filed Weights   09/16/16 2006 09/17/16 0633  Weight: 157 lb (71.2 kg) 150 lb (68 kg)    Telemetry    SB/SR generally 50's, NSVT on a couple occassions only - Personally Reviewed  ECG    No new EKG - Personally Reviewed  Physical Exam   GEN: No acute distress.   Neck: No JVD Cardiac: RRR, no murmurs, rubs, or gallops.  Respiratory: Clear to auscultation bilaterally. GI: Soft, nontender, non-distended  MS: No edema; No deformity. Neuro:   Nonfocal  Psych: Normal affect   Labs    Chemistry Recent Labs Lab 09/16/16 2011 09/16/16 2039 09/17/16 0622 09/18/16 0235  NA 141 144 142 139  K 4.4 4.4 4.2 5.1  CL 109 108 107 106  CO2 23  --  26 24  GLUCOSE 150* 148* 111* 131*  BUN 17 20 17 17   CREATININE 1.30* 1.20 1.25* 1.36*  CALCIUM 8.6*  --  8.4* 8.6*  GFRNONAA 53*  --  56* 50*  GFRAA >60  --  >60 58*  ANIONGAP 9  --  9 9     Hematology Recent Labs Lab 09/16/16 2011 09/16/16 2039 09/18/16 0235  WBC 7.9  --  9.1  RBC 4.75  --  4.73  HGB 12.3* 13.9 12.3*  HCT 40.5 41.0 40.8  MCV 85.3  --  86.3  MCH 25.9*  --  26.0  MCHC 30.4  --  30.1  RDW 22.4*  --  22.1*  PLT 351  --  302    Cardiac Enzymes Recent Labs Lab 09/17/16 0040 09/17/16 0622  TROPONINI 0.04* 0.07*  Recent Labs Lab 09/16/16 2032  TROPIPOC 0.00     BNPNo results for input(s): BNP, PROBNP in the last 168 hours.   DDimer No results for input(s): DDIMER in the last 168 hours.   Radiology      Cardiac Studies   11/30/15: TTE Study Conclusions - Left ventricle: The cavity size was mildly dilated. Wall thickness was normal. Systolic function was severely reduced. The estimated ejection fraction was in the range of 25% to 30%. There is akinesis of the inferolateral and inferior myocardium. Features are consistent with a pseudonormal left ventricular filling pattern, with concomitant abnormal relaxation and increased filling pressure (grade 2 diastolic dysfunction). Doppler parameters are consistent with high ventricular filling pressure. - Aortic valve: There was mild regurgitation. - Mitral valve: There was severe regurgitation. - Left atrium: The atrium was moderately dilated. - Right atrium: The atrium was moderately dilated. - Tricuspid valve: There was moderate regurgitation. - Pulmonary arteries: Systolic pressure was moderately increased. Impressions: - Definity used; akinesis of the inferior and  inferior lateral wall with overall severely reduced LV systolic function; grade 2 diastolic dysfunction with elevated LV filling pressure; biatrial enlargement; mild AI; severe MR; moderate TR with moderately elevated pulmonary pressure.  09/28/15: LHC Conclusion  Prox RCA to Mid RCA lesion, 100 %stenosed.  Ost LM to LM lesion, 30 %stenosed.  Mid Cx lesion, 70 %stenosed.  Ost 3rd Mrg to 3rd Mrg lesion, 75 %stenosed.  Prox LAD lesion, 35 %stenosed.  There is severe left ventricular systolic dysfunction.  LV end diastolic pressure is severely elevated.  The left ventricular ejection fraction is less than 25% by visual estimate.  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 1. 2 vessel obstructive CAD. There is chronic total occlusion of the proximal RCA. There is moderate disease in the distal LCx and third OM. This is unchanged from 2004. There is new aneurysmal dilation of the distal left main. 2. Severe LV dysfunction  3. Marked elevation of the LVEDP. Plan: continue medical therapy. He may benefit from additional diuresis.      Patient Profile     71 y.o. male hx of  CAD, mostly NICM w remote T RCA , lengthy hs of VT/VT storms w/ICD, persisitent A Fib, ICM s/p AICD, COPD, anxiety disorder, GIB, multiple hospitalizations for HCAP > ultimately felt to have developed pulm amio toxicity and remains chronically on O2 at home @ 2L, admitted yet again with recurrent VT/shocks  Assessment & Plan    1. VT storm, 3 shocks at home, one here, as well as slower VT episodes below detection Long hx of VT storms, now what appears to be refractory and back on amiodarone described by Dr. [name] as a last ditch effort to try and control his VT  Very difficult position given need for amiodarone and known pulmonary toxicity issues, now chronically on Oxygen. Appreciate CHF evaluation, unfortunately not a candidate for LVAD or transplant  ICD carelink Yesterday he had 4 shocks,  15 ATP therapies, all for VT Battery and lead status stable  Remains on Amiodarone gtt Infrequent NSVT only since yesterday AM's VT, will re-bolus and run gtt at 60 today  2. CAD     No anginal complaints      LHC last year is noted above, CAD not felt to be his VT trigger, no intervention     "statin" allergy, and no ASA w/Eliquis      Currently HR 50's, will hold off BB to avoid pacing (single lead device)  3. Chronic SOB, worsened when in VT     Chronic O2 needs     COPD, amiodarone pulmonary toxicity     O2 sat looks ok      4. NICM     CHF team will see     He does not appear overtly overloaded     BP has limited meds intermittently   5. PAFib     Continue Eliquis for CHADS2VASC of 3 (patient insists on low dose, declines 5mg )     Remains in SR  Signed, Sheilah Pigeon, PA-C  09/18/2016, 7:30 AM     As noted above, we have few options. Palliative care for goals of therapy will be important. Will anticipate IV amiodarone for 24 hours and high dose oral amiodarone to make sure no recurrent ventricular tachycardia prior to discharge. Patient expresses understanding.

## 2016-09-18 NOTE — Consult Note (Signed)
Consultation Note Date: 09/18/2016   Patient Name: [name]  DOB: 05-29-1945  MRN: [id number]  Age / Sex: 71 y.o., male  PCP: Joaquim Nam, MD Referring Physician: Rollene Rotunda, MD  Reason for Consultation: Establishing goals of care  HPI/Patient Profile: [name] is a 71 year old man with an ischemic cardiomyopathy, chronic systolic heart failure, class III, persistent atrial fibrillation, on Eliquis, ventricular tachycardia, status post ICD implantation, s/p recent ablation. He has oxygen dependent COPD.   Clinical Assessment and Goals of Care: [name] awareness that he has less than 1 year to live and wants to do anything reasonable to live as long as he can "as long as I'm not a vegetable laying here". Knowing he only has a year at most, he states he would like chest compressions, defibrillation, intubation. He does not want his ICD turned off, and discusses his prior amiodarone toxicity, and that it affects his lungs, however, it is the only treatment he can use at this time. He would like to be placed on the ventilator as long as "I'm not laying there like a vegetable".  He discussed taking his great grandson fishing and going on vacation, and wanting to be comfortable for the rest of his life and wishes to spend his remaining life at his home with his wife. When attempting to further clarify his goals he stated " I have a living will and my wife can make the decisions."    Today our focus was on introducing palliative care philosophy along with focus on building rapport with [name]. The primary decision maker is the patient.     SUMMARY OF RECOMMENDATIONS   Full code at this time. Patient would like to pursue reasonable treatment options to have a comfortable experience for th year left of his life.     Code Status/Advance Care Planning:  Full code    Prognosis:   Unable to determine  Discharge Planning: To Be Determined      Primary Diagnoses: Present on Admission: **None**   I have reviewed the medical record, interviewed the patient and family, and examined the patient. The following aspects are pertinent.  Past Medical History:  Diagnosis Date  . Acute lower GI bleeding 12/11/2011   "first time" (12/11/2011)  . Acute myocardial infarction, unspecified site, episode of care unspecified 1995   Pt living in [place], no stent, ?PTCA  . AICD (automatic cardioverter/defibrillator) present   . Allergic rhinitis, cause unspecified   . Anxiety   . Arthritis    "all over" (11/07/2015)  . Atrial fibrillation (HCC)    a. 05/2015 - converted to sinus in setting of ICD shocks; placed on eliquis 5 bid.  [name] CAD (coronary artery disease), autologous vein bypass graft   . Cervical herniated disc    told not to lift >10 lbs  . Chronic systolic CHF (congestive heart failure), NYHA class 2 (HCC)    Reports EF of 25%.   [name] COPD (chronic obstructive pulmonary disease) (HCC) 11/2012  by xray  . Diverticulosis    by CT scan  . HCAP (healthcare-associated pneumonia) 11/06/2015  . Hypertension   . Insomnia   . Paroxysmal ventricular tachycardia (HCC)   . Perennial allergic rhinitis    only to dust mites  . Pneumonia 2000s   "walking pneumonia"  . VT (ventricular tachycardia) (HCC)    a. 05/2015 - VT storm with multiple ICD shocks-->Amio 400 BID.   Social History   Social History  . Marital status: Married    Spouse name: N/A  . Number of children: 0  . Years of education: N/A   Occupational History  . [employer] truck driver (retired)    Social History Main Topics  . Smoking status: Former Smoker    Packs/day: 0.50    Years: 50.00    Types: Cigarettes, Cigars    Quit date: 09/27/2015  . Smokeless tobacco: Never Used  . Alcohol use No  . Drug use: No  . Sexual activity: Not Currently   Other Topics Concern  . None   Social  History Narrative   Lives with wife, married 1998   Grown children, 2 great grandchildren   Occupation: retired, was Presenter, broadcasting   Activity: walking, fishing   Diet: good water daily, fruits/vegetables rare      Wife is Product manager.    [id number], Human resources officer. No known agent orange exposure.     Family History  Problem Relation Age of Onset  . Diabetes Father   . Tracheal cancer Father 64       smoker  . Stroke Mother   . Cancer Sister        left eye  . CAD Neg Hx   . Colon cancer Neg Hx   . Prostate cancer Neg Hx    Scheduled Meds: . apixaban  2.5 mg Oral BID  . famotidine  10 mg Oral Daily  . ferrous sulfate  325 mg Oral BID  . furosemide  20 mg Oral Q M,W,F  . guaiFENesin  1,200 mg Oral BID  . mexiletine  300 mg Oral 3 times per day  . mometasone-formoterol  2 puff Inhalation BID  . ranolazine  500 mg Oral BID   Continuous Infusions: . amiodarone 60 mg/hr (09/18/16 0931)   PRN Meds:.diphenhydrAMINE **AND** acetaminophen, acetaminophen, albuterol, benzonatate, loperamide, metaxalone, ondansetron (ZOFRAN) IV, perflutren lipid microspheres (DEFINITY) IV suspension, promethazine, simethicone, traMADol, zolpidem Medications Prior to Admission:  Prior to Admission medications   Medication Sig Start Date End Date Taking? Authorizing Provider  acetaminophen (TYLENOL) 325 MG tablet Take 2 tablets (650 mg total) by mouth every 6 (six) hours as needed for mild pain (or Fever >/= 101). 12/06/15  Yes Joseph Art, DO  amiodarone (PACERONE) 200 MG tablet Take 1 tablet (200 mg total) by mouth daily. 07/22/16  Yes Marinus Maw, MD  apixaban (ELIQUIS) 2.5 MG TABS tablet Take 1 tablet (2.5 mg total) by mouth 2 (two) times daily. 04/05/16  Yes Marinus Maw, MD  benzonatate (TESSALON) 200 MG capsule Take 1 capsule (200 mg total) by mouth every 4 (four) hours as needed for cough. 12/06/15  Yes Vann, Jessica U, DO  budesonide-formoterol (SYMBICORT) 160-4.5 MCG/ACT inhaler Inhale 2 puffs  into the lungs 2 (two) times daily. 08/22/16  Yes Mannam, Praveen, MD  Chlorpheniramine-APAP (CORICIDIN) 2-325 MG TABS Take 2 tablets by mouth daily as needed (congestion/runny nose).   Yes [provider]  ferrous sulfate (FERROUSUL) 325 (65 FE) MG tablet Take 1 tablet (  325 mg total) by mouth 2 (two) times daily. 08/15/16  Yes Joaquim Nam, MD  furosemide (LASIX) 20 MG tablet Take 1 tablet (20 mg total) by mouth every Monday, Wednesday, and Friday. 04/05/16  Yes Marinus Maw, MD  Guaifenesin [facility] MAXIMUM STRENGTH) 1200 MG TB12 Take 1,200 mg by mouth 2 (two) times daily.   Yes [provider]  isosorbide mononitrate (IMDUR) 30 MG 24 hr tablet Take 0.5 tablets (15 mg total) by mouth 2 (two) times daily. 07/16/16  Yes Marinus Maw, MD  levalbuterol Pauline Aus) 0.63 MG/3ML nebulizer solution Take 0.63 mg by nebulization every 4 (four) hours as needed for wheezing or shortness of breath.   Yes [provider]  loperamide (IMODIUM) 2 MG capsule Take 2 capsules (4 mg total) by mouth as needed. Patient taking differently: Take 2 mg by mouth See admin instructions. Take 1 capsule (2 mg) by mouth after each bowel movement 08/12/16  Yes Joaquim Nam, MD  metaxalone (SKELAXIN) 800 MG tablet Take 0.5 tablets (400 mg total) by mouth 3 (three) times daily as needed for muscle spasms. 08/12/16  Yes Joaquim Nam, MD  mexiletine (MEXITIL) 150 MG capsule Take 2 capsules (300 mg total) by mouth every 8 (eight) hours. 8am, 3:30pm, 11:30pm 08/27/16  Yes Marinus Maw, MD  nitroGLYCERIN (NITROSTAT) 0.4 MG SL tablet PLACE 1 TABLET (0.4 MG TOTAL) UNDER THE TONGUE EVERY 5 (FIVE) MINUTES AS NEEDED. FOR CHEST PAIN. Patient taking differently: Place 0.4 mg under the tongue every 5 (five) minutes as needed for chest pain.  08/27/16  Yes Marinus Maw, MD  OXYGEN Inhale 2 L into the lungs continuous.   Yes [provider]  PROAIR HFA 108 (90 Base) MCG/ACT inhaler INHALE 1-2 PUFFS  INTO THE LUNGS EVERY 6 (SIX) HOURS AS NEEDED FOR WHEEZING OR SHORTNESS OF BREATH. 08/20/16  Yes Joaquim Nam, MD  promethazine (PHENERGAN) 25 MG tablet Take 25 mg by mouth every 6 (six) hours as needed for nausea or vomiting.   Yes [provider]  RANEXA 500 MG 12 hr tablet TAKE 1 TABLET (500 MG TOTAL) BY MOUTH 2 (TWO) TIMES DAILY. 04/01/16  Yes Seiler, Amber K, NP  ranitidine (ZANTAC) 150 MG capsule Take 150 mg by mouth daily as needed for heartburn.    Yes [provider]  Simethicone (GAS-X PO) Take 1-2 capsules by mouth daily as needed (pressure/bloating/gas). As needed.    Yes [provider]  traMADol (ULTRAM) 50 MG tablet Take 1 tablet (50 mg total) by mouth every 8 (eight) hours as needed (for pain). 05/16/16  Yes Joaquim Nam, MD  zolpidem (AMBIEN) 10 MG tablet TAKE 0.5-1 TABLETS (5-10 MG TOTAL) BY MOUTH AT BEDTIME AS NEEDED FOR SLEEP 09/18/16   Joaquim Nam, MD   Allergies  Allergen Reactions  . Ace Inhibitors Other (See Comments)    muscle pain. Tolerates ARBs.   . Codeine Other (See Comments)    "head wants to explode."  . Doxycycline Diarrhea and Nausea And Vomiting  . Kionex [Sodium Polystyrene Sulfonate] Other (See Comments)    SOB, pressure in chest, weakness fatigue.   [name] Penicillins Swelling    "started at point of injection; w/in 3 min my upper arm was swollen 3 times normal" Has patient had a PCN reaction causing immediate rash, facial/tongue/throat swelling, SOB or lightheadedness with hypotension: Yes Has patient had a PCN reaction causing severe rash involving mucus membranes or skin necrosis: No Has patient had  a PCN reaction that required hospitalization No Has patient had a PCN reaction occurring within the last 10 years: No If all of the above answers are "NO", then may proceed wi  . Lisinopril Other (See Comments)    Muscle Pain  . Statins Other (See Comments)    Myalgias per patient  . Hydrocodone Other (See Comments)     Severe headache, esp when combined with skelaxin.    . Lidocaine     Hallucinations, jerking  . Pacerone [Amiodarone] Other (See Comments)    Lung and heart problem (09/16/16 - pt is taking 200 mg daily with no reaction - states reaction was to a higher dose)  . Xanax [Alprazolam] Other (See Comments)    Nightmares.     Review of Systems  Constitutional: Positive for activity change.  All other systems reviewed and are negative.   Physical Exam  Constitutional: He is oriented to person, place, and time. No distress.  HENT:  Head: Normocephalic and atraumatic.  Cardiovascular:  Warm and dry  Pulmonary/Chest: No respiratory distress.  Neurological: He is alert and oriented to person, place, and time.    Vital Signs: BP 123/88 (BP Location: Right Arm)   Pulse (!) 51   Temp (!) 96.3 F (35.7 C) (Axillary)   Resp 20   Ht 5\' 3"  (1.6 m)   Wt 68 kg (150 lb)   SpO2 95%   BMI 26.57 kg/m  Pain Assessment: No/denies pain POSS *See Group Information*: 1-Acceptable,Awake and alert Pain Score: 0-No pain   SpO2: SpO2: 95 % O2 Device:SpO2: 95 % O2 Flow Rate: .O2 Flow Rate (L/min): 3 L/min  IO: Intake/output summary:  Intake/Output Summary (Last 24 hours) at 09/18/16 1655 Last data filed at 09/18/16 1600  Gross per 24 hour  Intake              590 ml  Output              400 ml  Net              190 ml    LBM: Last BM Date: 09/16/16 Baseline Weight: Weight: 71.2 kg (157 lb) Most recent weight: Weight: 68 kg (150 lb)     Palliative Assessment/Data: 30%    Time Total:1 hour Greater than 50%  of this time was spent counseling and coordinating care related to the above assessment and plan.  Signed by: Morton Stall, NP   Yong Channel, NP Palliative Medicine Team Pager # [phone] (M-F 8a-5p) Team Phone # [phone] (Nights/Weekends)   Please contact Palliative Medicine Team phone at [phone] for questions and concerns.  For individual provider: See  Loretha Stapler

## 2016-09-18 NOTE — Telephone Encounter (Signed)
Rx called to pharmacy as instructed. 

## 2016-09-18 NOTE — Telephone Encounter (Signed)
Please call in.  Thanks.   

## 2016-09-19 DIAGNOSIS — R531 Weakness: Secondary | ICD-10-CM

## 2016-09-19 DIAGNOSIS — Z515 Encounter for palliative care: Secondary | ICD-10-CM

## 2016-09-19 LAB — BASIC METABOLIC PANEL
ANION GAP: 9 (ref 5–15)
BUN: 22 mg/dL — ABNORMAL HIGH (ref 6–20)
CALCIUM: 8.4 mg/dL — AB (ref 8.9–10.3)
CO2: 24 mmol/L (ref 22–32)
Chloride: 105 mmol/L (ref 101–111)
Creatinine, Ser: 1.33 mg/dL — ABNORMAL HIGH (ref 0.61–1.24)
GFR, EST NON AFRICAN AMERICAN: 52 mL/min — AB (ref >60)
GLUCOSE: 126 mg/dL — AB (ref 65–99)
Potassium: 4.2 mmol/L (ref 3.5–5.1)
SODIUM: 138 mmol/L (ref 135–145)

## 2016-09-19 MED ORDER — AMIODARONE HCL 200 MG PO TABS
400.0000 mg | ORAL_TABLET | Freq: Two times a day (BID) | ORAL | Status: DC
  Administered 2016-09-19 – 2016-09-21 (×5): 400 mg via ORAL
  Filled 2016-09-19 (×5): qty 2

## 2016-09-19 NOTE — Progress Notes (Signed)
CARDIAC REHAB PHASE I   PRE:  Rate/Rhythm: 53 SB  BP:  Sitting: 110/72        SaO2: 96 3L  MODE:  Ambulation: 200 ft   POST:  Rate/Rhythm: 68 SR  BP:  Sitting: 134/79         SaO2: 89-90 3L  Pt ambulated 200 ft on 3L O2, hand held assist x2 for safety/equiptment, mostly steady gait, tolerated fairly well. Pt c/o DOE, standing rest x1. VSS. Will benefit from walking O2 sat prior to discharge, states he uses 2L at home. Pt appreciative of walk. Pt to recliner after walk, feet elevated, call bell within reach. Will follow.   [id number] Joylene Grapes, RN, BSN 09/19/2016 2:51 PM

## 2016-09-19 NOTE — Progress Notes (Signed)
Report called to [place] on 3W. Wife at bedside and aware that patient is transferring.

## 2016-09-19 NOTE — Progress Notes (Signed)
Patient complained of shortness of breath and chest fullness. Paged MD on Call for  [facility] Dr. Su Hilt.  Sats 98% patient also given Breathing treatment.

## 2016-09-19 NOTE — Progress Notes (Addendum)
Progress Note  Patient Name: [name] Date of Encounter: 09/19/2016  Primary Cardiologist: Dr. [name]  Subjective   Generally feeling weak, no CP, SOB at baseline.  Inpatient Medications    Scheduled Meds: . apixaban  2.5 mg Oral BID  . famotidine  10 mg Oral Daily  . ferrous sulfate  325 mg Oral BID  . furosemide  20 mg Oral Q M,W,F  . guaiFENesin  1,200 mg Oral BID  . mexiletine  300 mg Oral 3 times per day  . mometasone-formoterol  2 puff Inhalation BID  . ranolazine  500 mg Oral BID   Continuous Infusions: . amiodarone 60 mg/hr (09/19/16 0342)   PRN Meds: diphenhydrAMINE **AND** acetaminophen, acetaminophen, albuterol, benzonatate, loperamide, metaxalone, ondansetron (ZOFRAN) IV, promethazine, simethicone, traMADol, zolpidem   Vital Signs    Vitals:   09/18/16 1938 09/18/16 2000 09/19/16 0005 09/19/16 0339  BP: 95/83 124/81 (!) 126/91 116/76  Pulse: 65 69    Resp: 17 (!) 27    Temp: 97.6 F (36.4 C)  97.6 F (36.4 C) 97.6 F (36.4 C)  TempSrc: Axillary  Axillary Axillary  SpO2: 95% 95%    Weight:      Height:        Intake/Output Summary (Last 24 hours) at 09/19/16 0716 Last data filed at 09/18/16 2358  Gross per 24 hour  Intake            924.8 ml  Output                0 ml  Net            924.8 ml   Filed Weights   09/16/16 2006 09/17/16 0633  Weight: 157 lb (71.2 kg) 150 lb (68 kg)    Telemetry    SB 50's, no VT - Personally Reviewed  ECG    No new EKG - Personally Reviewed  Physical Exam   GEN: No acute distress.   Neck: No JVD Cardiac: RRR, no murmurs, rubs, or gallops.  Respiratory:  Clear to auscultation bilaterally. GI: Soft, nontender, non-distended  MS: No edema; No deformity. Neuro:  Nonfocal  Psych: Normal affect   Labs    Chemistry  Recent Labs Lab 09/17/16 0622 09/18/16 0235 09/19/16 0243  NA 142 139 138  K 4.2 5.1 4.2  CL 107 106 105  CO2 26 24 24   GLUCOSE 111* 131* 126*  BUN 17 17 22*    CREATININE 1.25* 1.36* 1.33*  CALCIUM 8.4* 8.6* 8.4*  GFRNONAA 56* 50* 52*  GFRAA >60 58* >60  ANIONGAP 9 9 9      Hematology  Recent Labs Lab 09/16/16 2011 09/16/16 2039 09/18/16 0235  WBC 7.9  --  9.1  RBC 4.75  --  4.73  HGB 12.3* 13.9 12.3*  HCT 40.5 41.0 40.8  MCV 85.3  --  86.3  MCH 25.9*  --  26.0  MCHC 30.4  --  30.1  RDW 22.4*  --  22.1*  PLT 351  --  302    Cardiac Enzymes  Recent Labs Lab 09/17/16 0040 09/17/16 0622  TROPONINI 0.04* 0.07*     Recent Labs Lab 09/16/16 2032  TROPIPOC 0.00     BNPNo results for input(s): BNP, PROBNP in the last 168 hours.   DDimer No results for input(s): DDIMER in the last 168 hours.   Radiology      Cardiac Studies   11/30/15: TTE Study Conclusions - Left ventricle: The cavity size  was mildly dilated. Wall thickness was normal. Systolic function was severely reduced. The estimated ejection fraction was in the range of 25% to 30%. There is akinesis of the inferolateral and inferior myocardium. Features are consistent with a pseudonormal left ventricular filling pattern, with concomitant abnormal relaxation and increased filling pressure (grade 2 diastolic dysfunction). Doppler parameters are consistent with high ventricular filling pressure. - Aortic valve: There was mild regurgitation. - Mitral valve: There was severe regurgitation. - Left atrium: The atrium was moderately dilated. - Right atrium: The atrium was moderately dilated. - Tricuspid valve: There was moderate regurgitation. - Pulmonary arteries: Systolic pressure was moderately increased. Impressions: - Definity used; akinesis of the inferior and inferior lateral wall with overall severely reduced LV systolic function; grade 2 diastolic dysfunction with elevated LV filling pressure; biatrial enlargement; mild AI; severe MR; moderate TR with moderately elevated pulmonary pressure.  09/28/15: LHC Conclusion  Prox  RCA to Mid RCA lesion, 100 %stenosed.  Ost LM to LM lesion, 30 %stenosed.  Mid Cx lesion, 70 %stenosed.  Ost 3rd Mrg to 3rd Mrg lesion, 75 %stenosed.  Prox LAD lesion, 35 %stenosed.  There is severe left ventricular systolic dysfunction.  LV end diastolic pressure is severely elevated.  The left ventricular ejection fraction is less than 25% by visual estimate.  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 1. 2 vessel obstructive CAD. There is chronic total occlusion of the proximal RCA. There is moderate disease in the distal LCx and third OM. This is unchanged from 2004. There is new aneurysmal dilation of the distal left main. 2. Severe LV dysfunction  3. Marked elevation of the LVEDP. Plan: continue medical therapy. He may benefit from additional diuresis.      Patient Profile     71 y.o. male hx of  CAD, mostly NICM w remote T RCA , lengthy hs of VT/VT storms w/ICD, persisitent A Fib, ICM s/p AICD, COPD, anxiety disorder, GIB, multiple hospitalizations for HCAP > ultimately felt to have developed pulm amio toxicity and remains chronically on O2 at home @ 2L, admitted yet again with recurrent VT/shocks  Assessment & Plan    1. VT storm, 3 shocks at home, one here, as well as slower VT episodes below detection Long hx of VT storms, now what appears to be refractory and back on amiodarone described by Dr. [name] as a last ditch effort to try and control his VT  Very difficult position given need for amiodarone and known pulmonary toxicity issues, now chronically on Oxygen. Appreciate CHF evaluation, unfortunately not a candidate for LVAD or transplant  ICD carelink prior he had 4 shocks, 15 ATP therapies, all for VT Battery and lead status stable  Transition to PO amiodarone today, to telemetry bed Cardiac rehab with see the patient  2. CAD     No anginal complaints      LHC last year is noted above, CAD not felt to be his VT trigger, no intervention     "statin"  allergy, and no ASA w/Eliquis      Currently HR 50's, will hold off BB to avoid pacing (single lead device)  3. Chronic SOB, worsened when in VT     Chronic O2 needs     COPD, amiodarone pulmonary toxicity     O2 sats look ok      4. NICM     unfortunately no advanced HF options     He does not appear overtly overloaded     BP has  limited meds intermittently   5. PAFib     Continue Eliquis for CHADS2VASC of 3 (patient insists on low dose, declines 5mg )     Remains in SR  Signed, Sheilah Pigeon, PA-C  09/19/2016, 7:16 AM   Patient seen and examined.   No interval ventricular tachycardia Transition to oral amiodarone. Transition to the floor patient is complaining of weakness with some orthostatic intolerance; it is his impression that this may be related to the amiodarone. I wonder related to bed rest and  antecedent VT storm ; consult physical therapy/cardiac rehabilitation.

## 2016-09-19 NOTE — Progress Notes (Signed)
Daily Progress Note   Patient Name: [name]       Date: 09/19/2016 DOB: 16-Mar-1945  Age: 71 y.o. MRN#: [id number] Attending Physician: Rollene Rotunda, MD Primary Care Physician: Joaquim Nam, MD Admit Date: 09/16/2016  Reason for Consultation/Follow-up: Establishing goals of care  Subjective: Patient states he feels weak today. He is at peace with discussions with his treatment teams. During conversation, he stated he wants to continue treatment and continue his ICD, however, he does not want CPR or to be intubated or put on a ventilator stating "just let me go." Upon progression of his illness, he does not want a feeding tube if he develops difficulty swallowing or IV fluids to keep him alive. He is continuing Amiodarone therapy though in the past he has had Amiodarone toxicity, and states this this is probably hurting his lungs and causing his weakness. He is amenable to home hospice at this time.    Length of Stay: 2  Current Medications: Scheduled Meds:  . amiodarone  400 mg Oral BID  . apixaban  2.5 mg Oral BID  . famotidine  10 mg Oral Daily  . ferrous sulfate  325 mg Oral BID  . furosemide  20 mg Oral Q M,W,F  . guaiFENesin  1,200 mg Oral BID  . mexiletine  300 mg Oral 3 times per day  . mometasone-formoterol  2 puff Inhalation BID  . ranolazine  500 mg Oral BID     PRN Meds: diphenhydrAMINE **AND** acetaminophen, acetaminophen, albuterol, benzonatate, loperamide, metaxalone, ondansetron (ZOFRAN) IV, promethazine, simethicone, traMADol, zolpidem  Physical Exam  Constitutional: He is oriented to person, place, and time. No distress.  HENT:  Head: Normocephalic and atraumatic.  Cardiovascular:  Warm and dry  Pulmonary/Chest: Effort normal. No respiratory  distress.  Neurological: He is alert and oriented to person, place, and time.            Vital Signs: BP 118/78 (BP Location: Right Arm)   Pulse (!) 56   Temp 97.6 F (36.4 C) (Axillary)   Resp 20   Ht 5\' 3"  (1.6 m)   Wt 68 kg (150 lb)   SpO2 95%   BMI 26.57 kg/m  SpO2: SpO2: 95 % O2 Device: O2 Device: Nasal Cannula O2 Flow Rate: O2 Flow Rate (L/min): 3 L/min  Intake/output  summary:  Intake/Output Summary (Last 24 hours) at 09/19/16 1508 Last data filed at 09/19/16 1136  Gross per 24 hour  Intake            809.8 ml  Output              100 ml  Net            709.8 ml   LBM: Last BM Date: 09/19/16 Baseline Weight: Weight: 71.2 kg (157 lb) Most recent weight: Weight: 68 kg (150 lb)       Palliative Assessment/Data: 30%      Patient Active Problem List   Diagnosis Date Noted  . Iron deficiency anemia 08/15/2016  . RLS (restless legs syndrome) 08/12/2016  . ICD (implantable cardioverter-defibrillator) discharge 06/20/2016  . Atrial tachycardia (HCC) 06/20/2016  . Ventricular tachycardia (HCC) 06/20/2016  . Hyperkalemia 02/13/2016  . AKI (acute kidney injury) (HCC)   . Centrilobular emphysema (HCC)   . Postcholecystectomy diarrhea 12/12/2015  . Hypoalbuminemia due to protein-calorie malnutrition (HCC)   . Anemia of chronic disease   . Chronic systolic heart failure (HCC)   . Coronary artery disease involving native coronary artery of native heart without angina pectoris   . S/P implantation of automatic cardioverter/defibrillator (AICD)   . Supplemental oxygen dependent   . Physical deconditioning 11/28/2015  . Transaminitis 11/28/2015  . Hypoxia   . Fall at home 10/11/2015  . VT (ventricular tachycardia) (HCC) 09/27/2015  . Memory loss 09/24/2015  . Encounter for screening examination for infectious disease 09/24/2015  . Chronic atrial fibrillation (HCC) 05/28/2015  . Syncope 05/12/2015  . Advance care planning 09/16/2014  . Muscle ache 09/16/2014  . Back  pain 09/08/2013  . Abdominal pain, chronic, epigastric 09/08/2013  . Fatigue 06/11/2013  . Chest wall mass 11/05/2012  . Arthritis   . Insomnia   . Anxiety   . Ischemic cardiomyopathy 02/12/2011  . Acute on chronic systolic congestive heart failure (HCC) 05/10/2010  . Hyperlipidemia 08/25/2008  . Amiodarone pulmonary toxicity 08/25/2008    Palliative Care Assessment & Plan   Patient Profile:  SHERIFF RODENBERG is a 71 year old man with an ischemic cardiomyopathy, chronic systolic heart failure, class III, persistent atrial fibrillation, on Eliquis, ventricular tachycardia, status post ICD implantation, s/p recent ablation. He has oxygen dependent COPD.    Assessment: Mr. Force feels weak. He is currently being treated with Amiodarone therapy.  He has severe LV dysfunction and CAD. He has O2 dependant COPD.   Concept of Hospice Care was discussed  Natural trajectory and expectations at EOL were discussed.  Questions and concerns addressed.   Family encouraged to call with questions or concerns.  PMT will continue to support holistically.   Recommendations/Plan:  Home with Hospice  Goals of Care and Additional Recommendations:  Limitations on Scope of Treatment: DNR, however continue ICD. No feeding tube. Treat the treatable to maximize quantity WITH good QOL.   Code Status:    Code Status Orders        Start     Ordered   09/19/16 1438  Do not attempt resuscitation (DNR)  Continuous    Question Answer Comment  In the event of cardiac or respiratory ARREST Do not call a "code blue"   In the event of cardiac or respiratory ARREST Do not perform Intubation, CPR, defibrillation or ACLS   In the event of cardiac or respiratory ARREST Use medication by any route, position, wound care, and other measures to relive pain and suffering.  May use oxygen, suction and manual treatment of airway obstruction as needed for comfort.      09/19/16 1438    Code Status History    Date  Active Date Inactive Code Status Order ID Comments User Context   09/16/2016 11:53 PM 09/19/2016  2:38 PM Full Code [id number]  Rollene Rotunda, MD ED   07/07/2016 10:57 AM 07/11/2016  2:35 PM Full Code [id number]  Abelino Derrick, PA-C ED   06/20/2016  2:21 AM 06/22/2016  5:22 PM Full Code [id number]  Arlester Marker, MD ED   04/29/2016  8:35 PM 04/30/2016  4:17 PM Full Code [id number]  Marinus Maw, MD Inpatient   03/10/2016  3:55 AM 03/13/2016  4:07 PM Full Code [id number]  Quintella Reichert, MD ED   12/06/2015  5:06 PM 12/06/2015  5:06 PM Full Code [id number]  Jacquelynn Cree, PA-C Inpatient   12/06/2015  5:06 PM 12/17/2015  6:38 PM Full Code [id number]  Jacquelynn Cree, PA-C Inpatient   11/28/2015  8:05 AM 12/06/2015  4:30 PM Full Code [id number]  Russella Dar, NP Inpatient   11/17/2015  9:17 PM 11/21/2015 10:38 PM Full Code [id number]  Dorothea Ogle, MD Inpatient   11/07/2015  2:24 AM 11/08/2015  4:31 PM Full Code [id number]  Lorretta Harp, MD ED   09/27/2015  1:05 PM 09/28/2015  7:32 PM Full Code [id number]  Sheilah Pigeon, PA-C ED   05/25/2015  7:47 PM 05/28/2015  3:09 PM Full Code [id number]  Barrett, Joline Salt, PA-C ED   01/07/2012  2:47 PM 01/08/2012  4:25 PM Full Code [id number]  Leta Baptist., PA Inpatient   12/11/2011  6:42 PM 12/13/2011  5:09 PM Full Code [id number]  Jerene Dilling Inpatient       Prognosis:   < 6 months  Discharge Planning:  Home with Hospice  Care plan was discussed with primary RN  Thank you for allowing the Palliative Medicine Team to assist in the care of this patient.   Total Time 1 hour Prolonged Time Billed  No      Greater than 50%  of this time was spent counseling and coordinating care related to the above assessment and plan.  Morton Stall, NP   Yong Channel, NP Palliative Medicine Team Pager # [phone] (M-F 8a-5p) Team Phone # [phone] (Nights/Weekends)  Please contact Palliative Medicine Team phone at [phone] for questions and concerns.

## 2016-09-20 DIAGNOSIS — I481 Persistent atrial fibrillation: Secondary | ICD-10-CM

## 2016-09-20 LAB — BASIC METABOLIC PANEL
Anion gap: 8 (ref 5–15)
BUN: 21 mg/dL — AB (ref 6–20)
CO2: 25 mmol/L (ref 22–32)
CREATININE: 1.24 mg/dL (ref 0.61–1.24)
Calcium: 8.5 mg/dL — ABNORMAL LOW (ref 8.9–10.3)
Chloride: 107 mmol/L (ref 101–111)
GFR calc Af Amer: >60 mL/min (ref >60)
GFR, EST NON AFRICAN AMERICAN: 56 mL/min — AB (ref >60)
GLUCOSE: 97 mg/dL (ref 65–99)
Potassium: 4.4 mmol/L (ref 3.5–5.1)
SODIUM: 140 mmol/L (ref 135–145)

## 2016-09-20 MED ORDER — APIXABAN 2.5 MG PO TABS
2.5000 mg | ORAL_TABLET | Freq: Two times a day (BID) | ORAL | Status: DC
  Administered 2016-09-20 – 2016-09-21 (×2): 2.5 mg via ORAL
  Filled 2016-09-20 (×2): qty 1

## 2016-09-20 MED ORDER — RANOLAZINE ER 500 MG PO TB12
1000.0000 mg | ORAL_TABLET | Freq: Two times a day (BID) | ORAL | Status: DC
  Administered 2016-09-20 – 2016-09-21 (×2): 1000 mg via ORAL
  Filled 2016-09-20 (×2): qty 2

## 2016-09-20 MED ORDER — APIXABAN 5 MG PO TABS: 5.0000 mg | ORAL_TABLET | Freq: Two times a day (BID) | ORAL | Status: DC

## 2016-09-20 NOTE — Progress Notes (Addendum)
Progress Note  Patient Name: [name] Date of Encounter: 09/20/2016  Primary Cardiologist: Dr. [name]  Subjective   Feeling fairly well, at his baseline breathing-wise, felt like he did OK ambulating yesterday, no CP No palps   Tolerating amio  Inpatient Medications    Scheduled Meds: . amiodarone  400 mg Oral BID  . apixaban  2.5 mg Oral BID  . famotidine  10 mg Oral Daily  . ferrous sulfate  325 mg Oral BID  . furosemide  20 mg Oral Q M,W,F  . guaiFENesin  1,200 mg Oral BID  . mexiletine  300 mg Oral 3 times per day  . mometasone-formoterol  2 puff Inhalation BID  . ranolazine  500 mg Oral BID   Continuous Infusions:  PRN Meds: diphenhydrAMINE **AND** acetaminophen, acetaminophen, albuterol, benzonatate, loperamide, metaxalone, ondansetron (ZOFRAN) IV, promethazine, simethicone, traMADol, zolpidem   Vital Signs    Vitals:   09/19/16 1756 09/19/16 1949 09/19/16 2209 09/20/16 0539  BP:  (!) 111/92  123/72  Pulse:  (!) 56 62 60  Resp:  18 18 18   Temp:  97.8 F (36.6 C)  97.7 F (36.5 C)  TempSrc:  Oral  Axillary  SpO2: 97% 95% 94% 91%  Weight:    152 lb 14.4 oz (69.4 kg)  Height:        Intake/Output Summary (Last 24 hours) at 09/20/16 0820 Last data filed at 09/20/16 0600  Gross per 24 hour  Intake              545 ml  Output              100 ml  Net              445 ml   Filed Weights   09/16/16 2006 09/17/16 0633 09/20/16 0539  Weight: 157 lb (71.2 kg) 150 lb (68 kg) 152 lb 14.4 oz (69.4 kg)    Telemetry    SB 50's, infrequent V paced beats, no VT - Personally Reviewed  ECG    No new EKG - Personally Reviewed  Physical Exam   GEN: No acute distress.   Neck: No JVD Cardiac: RRR, no murmurs, rubs, or gallops.  Respiratory:  Clear to auscultation bilaterally. GI: Soft, nontender, non-distended  MS: No edema; No deformity. Neuro:  Nonfocal  Psych: Normal affect   Labs    Chemistry  Recent Labs Lab 09/18/16 0235  09/19/16 0243 09/20/16 0402  NA 139 138 140  K 5.1 4.2 4.4  CL 106 105 107  CO2 24 24 25   GLUCOSE 131* 126* 97  BUN 17 22* 21*  CREATININE 1.36* 1.33* 1.24  CALCIUM 8.6* 8.4* 8.5*  GFRNONAA 50* 52* 56*  GFRAA 58* >60 >60  ANIONGAP 9 9 8      Hematology  Recent Labs Lab 09/16/16 2011 09/16/16 2039 09/18/16 0235  WBC 7.9  --  9.1  RBC 4.75  --  4.73  HGB 12.3* 13.9 12.3*  HCT 40.5 41.0 40.8  MCV 85.3  --  86.3  MCH 25.9*  --  26.0  MCHC 30.4  --  30.1  RDW 22.4*  --  22.1*  PLT 351  --  302    Cardiac Enzymes  Recent Labs Lab 09/17/16 0040 09/17/16 0622  TROPONINI 0.04* 0.07*     Recent Labs Lab 09/16/16 2032  TROPIPOC 0.00     BNPNo results for input(s): BNP, PROBNP in the last 168 hours.   DDimer No results  for input(s): DDIMER in the last 168 hours.   Radiology    No new studies  Cardiac Studies   11/30/15: TTE Study Conclusions - Left ventricle: The cavity size was mildly dilated. Wall thickness was normal. Systolic function was severely reduced. The estimated ejection fraction was in the range of 25% to 30%. There is akinesis of the inferolateral and inferior myocardium. Features are consistent with a pseudonormal left ventricular filling pattern, with concomitant abnormal relaxation and increased filling pressure (grade 2 diastolic dysfunction). Doppler parameters are consistent with high ventricular filling pressure. - Aortic valve: There was mild regurgitation. - Mitral valve: There was severe regurgitation. - Left atrium: The atrium was moderately dilated. - Right atrium: The atrium was moderately dilated. - Tricuspid valve: There was moderate regurgitation. - Pulmonary arteries: Systolic pressure was moderately increased. Impressions: - Definity used; akinesis of the inferior and inferior lateral wall with overall severely reduced LV systolic function; grade 2 diastolic dysfunction with elevated LV filling  pressure; biatrial enlargement; mild AI; severe MR; moderate TR with moderately elevated pulmonary pressure.  09/28/15: LHC Conclusion  Prox RCA to Mid RCA lesion, 100 %stenosed.  Ost LM to LM lesion, 30 %stenosed.  Mid Cx lesion, 70 %stenosed.  Ost 3rd Mrg to 3rd Mrg lesion, 75 %stenosed.  Prox LAD lesion, 35 %stenosed.  There is severe left ventricular systolic dysfunction.  LV end diastolic pressure is severely elevated.  The left ventricular ejection fraction is less than 25% by visual estimate.  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 1. 2 vessel obstructive CAD. There is chronic total occlusion of the proximal RCA. There is moderate disease in the distal LCx and third OM. This is unchanged from 2004. There is new aneurysmal dilation of the distal left main. 2. Severe LV dysfunction  3. Marked elevation of the LVEDP. Plan: continue medical therapy. He may benefit from additional diuresis.      Patient Profile     71 y.o. male hx of  CAD, mostly NICM w remote T RCA , lengthy hs of VT/VT storms w/ICD, persisitent A Fib, ICM s/p AICD, COPD, anxiety disorder, GIB, multiple hospitalizations for HCAP > ultimately felt to have developed pulm amio toxicity and remains chronically on O2 at home @ 2L, admitted yet again with recurrent VT/shocks  Assessment & Plan    1. VT storm, 3 shocks at home, one here, as well as slower VT episodes below detection Long hx of VT storms, now what appears to be refractory and back on amiodarone described by Dr. [name] as a last ditch effort to try and control his VT  Very difficult position given need for amiodarone and known pulmonary toxicity issues, now chronically on Oxygen. Appreciate CHF evaluation, unfortunately not a candidate for LVAD or transplant  ICD carelink prior he had 4 shocks, 15 ATP therapies, all for VT Battery and lead status stable  NO VT yesterday on PO amiodarone, 400mg  BID Ambulated with cardiac  rehab BMET stable Huston Foley may limit [place] as well, so far rarely paced  2. CAD     No anginal complaints      LHC last year is noted above, CAD not felt to be his VT trigger, no intervention     "statin" allergy, and no ASA w/Eliquis      Currently HR 50's, will hold off BB to avoid pacing (single lead device)  3. Chronic SOB, worsened when in VT     Chronic O2 needs     COPD, amiodarone  pulmonary toxicity     O2 sats look ok      4. NICM     unfortunately no advanced HF options     He does not appear overtly overloaded     BP has limited meds intermittently   5. PAFib     Continue Eliquis for CHADS2VASC of 3 (patient insists on low dose, declines 5mg )     Remains in SR  Signed, Sheilah Pigeon, PA-C  09/20/2016, 8:20 AM   Patient seen and examined.  Will increase apixoban 2.5>>5 but he has refused this in the past  Continue amio  No VT overnight and anticipate discharge in am Increase ranolazine  500>>1000 based on RAID trial   End of life discussions regarding closure

## 2016-09-20 NOTE — Progress Notes (Signed)
CARDIAC REHAB PHASE I  Came to walk with pt, pt currently ambulating with cardiac mobility specialist. Pt has walked twice today. Will follow up tomorrow as schedule permits.  Joylene Grapes, RN, BSN 09/20/2016 2:25 PM

## 2016-09-20 NOTE — Evaluation (Signed)
Physical Therapy Evaluation Patient Details Name: [name] MRN: [id number] DOB: 16-Jan-1946 Today's Date: 09/20/2016   History of Present Illness  Pt is a 71 y/o male admitted secondary to ventricular tachycardia. PMH includes ventricular tachycardia s/p ICD implant, a fib, ischemic cardiomyopathy, CHF, CAD, SOB on 2L of oxygen, MI, and HTN.   Clinical Impression  Pt admitted secondary to problem above with deficits below. PTA, pt was independent with functional mobility and was on 2L of oxygen. Upon eval, pt presenting with mild instability, generalized weakness, and decreased cardiopulmonary endurance. Pt requiring supervision for all tasks. Attempted gait training on 2L, and pt tolerated well when ambulating, however, upon seated rest sats dropped to lower 80s and required 3L to return to >90%. Discussed energy conservation techniques for home. No follow up PT recommended, but will continue to follow acutely to address deficits and ensure safety and independence with functional mobility.     Follow Up Recommendations No PT follow up;Supervision - Intermittent    Equipment Recommendations  None recommended by PT    Recommendations for Other Services       Precautions / Restrictions Precautions Precautions: Other (comment) Precaution Comments: Watch O2 sats  Restrictions Weight Bearing Restrictions: No      Mobility  Bed Mobility Overal bed mobility: Modified Independent             General bed mobility comments: Increased time, however, no assist required.   Transfers Overall transfer level: Needs assistance Equipment used: None Transfers: Sit to/from Stand Sit to Stand: Supervision         General transfer comment: Supervision for safety.   Ambulation/Gait Ambulation/Gait assistance: Supervision Ambulation Distance (Feet): 200 Feet Assistive device: None Gait Pattern/deviations: Step-through pattern;Decreased stride length Gait velocity: Decreased Gait  velocity interpretation: Below normal speed for age/gender General Gait Details: Mild instability, however, no LOB noted. DOE at 2/3 and required standing rest X1 with cues for pursed lip breathing. Attempted gait on 2L and oxygen remained above 90% during ambulation, however, upon seated rest dropped down to 80% and required 3L to increase back to >90%.   Stairs            Wheelchair Mobility    Modified Rankin (Stroke Patients Only)       Balance Overall balance assessment: Needs assistance Sitting-balance support: No upper extremity supported;Feet supported Sitting balance-Leahy Scale: Good     Standing balance support: No upper extremity supported;During functional activity Standing balance-Leahy Scale: Fair                               Pertinent Vitals/Pain Pain Assessment: No/denies pain    Home Living Family/patient expects to be discharged to:: Private residence Living Arrangements: Spouse/significant other Available Help at Discharge: Family;Available 24 hours/day Type of Home: House Home Access: Stairs to enter Entrance Stairs-Rails: None Entrance Stairs-Number of Steps: 2 steps into back door Home Layout: Two level;1/2 bath on main level;Bed/bath upstairs Home Equipment: Walker - 2 wheels Additional Comments: On 2L of oxygen at home    Prior Function Level of Independence: Independent               Hand Dominance   Dominant Hand: Right    Extremity/Trunk Assessment   Upper Extremity Assessment Upper Extremity Assessment: Overall WFL for tasks assessed    Lower Extremity Assessment Lower Extremity Assessment: Generalized weakness    Cervical / Trunk Assessment Cervical / Trunk Assessment: Normal  Communication   Communication: No difficulties  Cognition Arousal/Alertness: Awake/alert Behavior During Therapy: WFL for tasks assessed/performed Overall Cognitive Status: Within Functional Limits for tasks assessed                                         General Comments General comments (skin integrity, edema, etc.): Pt possibly going home with hospice, however, has not decided. Will continue to follow and update recommendations.     Exercises     Assessment/Plan    PT Assessment Patient needs continued PT services  PT Problem List Decreased strength;Decreased balance;Decreased mobility       PT Treatment Interventions Gait training;Stair training;Functional mobility training;Therapeutic activities;Therapeutic exercise;Neuromuscular re-education;Balance training;Patient/family education;DME instruction    PT Goals (Current goals can be found in the Care Plan section)  Acute Rehab PT Goals Patient Stated Goal: to go home  PT Goal Formulation: With patient Time For Goal Achievement: 09/27/16 Potential to Achieve Goals: Good    Frequency Min 3X/week   Barriers to discharge        Co-evaluation               AM-PAC PT "6 Clicks" Daily Activity  Outcome Measure Difficulty turning over in bed (including adjusting bedclothes, sheets and blankets)?: A Little Difficulty moving from lying on back to sitting on the side of the bed? : A Little Difficulty sitting down on and standing up from a chair with arms (e.g., wheelchair, bedside commode, etc,.)?: None Help needed moving to and from a bed to chair (including a wheelchair)?: None Help needed walking in hospital room?: None Help needed climbing 3-5 steps with a railing? : A Little 6 Click Score: 21    End of Session Equipment Utilized During Treatment: Gait belt;Oxygen Activity Tolerance: Patient tolerated treatment well Patient left: in bed;with call bell/phone within reach Nurse Communication: Mobility status;Other (comment) (notified of BM) PT Visit Diagnosis: Unsteadiness on feet (R26.81);Muscle weakness (generalized) (M62.81)    Time: 1610-9604 PT Time Calculation (min) (ACUTE ONLY): 21 min   Charges:   PT  Evaluation $PT Eval Moderate Complexity: 1 Mod PT Treatments $Gait Training: 8-22 mins   PT G Codes:        Gladys Damme, PT, DPT  Acute Rehabilitation Services  Pager: [phone]   Lehman Prom 09/20/2016, 11:10 AM

## 2016-09-20 NOTE — Care Management (Signed)
Referral from Hospice NP: Please help arrange home hospice upon discharge. CM did speak with pt in regards to Hospice at home. Agecny List provided- pt states he needs to speak with his wife in regards to Hospice before he makes a decision. CM will continue to monitor. Gala Lewandowsky, RN, BSN Case Manager [phone]

## 2016-09-20 NOTE — Care Management Important Message (Signed)
Important Message  Patient Details  Name: [name] MRN: [id number] Date of Birth: 08-10-1945   Medicare Important Message Given:  Yes    [name] Abena 09/20/2016, 11:00 AM

## 2016-09-21 ENCOUNTER — Encounter (HOSPITAL_COMMUNITY): Payer: Self-pay | Admitting: Physician Assistant

## 2016-09-21 ENCOUNTER — Other Ambulatory Visit: Payer: Self-pay | Admitting: Physician Assistant

## 2016-09-21 DIAGNOSIS — I5022 Chronic systolic (congestive) heart failure: Secondary | ICD-10-CM

## 2016-09-21 DIAGNOSIS — Z66 Do not resuscitate: Secondary | ICD-10-CM

## 2016-09-21 LAB — BASIC METABOLIC PANEL
Anion gap: 9 (ref 5–15)
BUN: 20 mg/dL (ref 6–20)
CO2: 24 mmol/L (ref 22–32)
CREATININE: 1.32 mg/dL — AB (ref 0.61–1.24)
Calcium: 8.7 mg/dL — ABNORMAL LOW (ref 8.9–10.3)
Chloride: 107 mmol/L (ref 101–111)
GFR, EST NON AFRICAN AMERICAN: 52 mL/min — AB (ref >60)
Glucose, Bld: 143 mg/dL — ABNORMAL HIGH (ref 65–99)
POTASSIUM: 5.2 mmol/L — AB (ref 3.5–5.1)
SODIUM: 140 mmol/L (ref 135–145)

## 2016-09-21 MED ORDER — NITROGLYCERIN 0.4 MG SL SUBL: 0.4000 mg | SUBLINGUAL_TABLET | SUBLINGUAL | Status: DC | PRN

## 2016-09-21 MED ORDER — LOPERAMIDE HCL 2 MG PO CAPS: 2.0000 mg | ORAL_CAPSULE | ORAL | Status: AC

## 2016-09-21 MED ORDER — AMIODARONE HCL 400 MG PO TABS: 400.0000 mg | ORAL_TABLET | Freq: Two times a day (BID) | ORAL | 11 refills | Status: DC

## 2016-09-21 MED ORDER — RANOLAZINE ER 1000 MG PO TB12: 1000.0000 mg | ORAL_TABLET | Freq: Two times a day (BID) | ORAL | 11 refills | Status: DC

## 2016-09-21 NOTE — Care Management Note (Signed)
Case Management Note  Patient Details  Name: [name] MRN: [id number] Date of Birth: 05/07/1945  Subjective/Objective: Spoke with pt and wife, [name] in room to ascertain choice of Hospice agency of choice. They will be using [facility] and CM notified Bambi, the rep for that agency @ [phone] who is now active with [employer] and will let me know if she needs additional info. Pt and wife assure me they have no other DME needs at this time.  All demographics confirmed on face sheet as accurate.                  Action/Plan: CM will sign off for now but will be available should additional discharge needs arise or disposition change.    Expected Discharge Date:  09/21/16               Expected Discharge Plan:  Home w Hospice Care  In-House Referral:  NA  Discharge planning Services  CM Consult  Post Acute Care Choice:  NA (Have RW, Rollator, Transport Chair and Home O2) Choice offered to:  Patient, Spouse [name] (spouse) at bedside)  DME Arranged:  N/A, Other see comment (None needed at this time ) DME Agency:  NA  HH Arranged:  NA HH Agency:  [facility]  Status of Service:  Completed, signed off  If discussed at Long Length of Stay Meetings, dates discussed:    Additional Comments:  Yvone Neu, RN 09/21/2016, 1:01 PM

## 2016-09-21 NOTE — Discharge Summary (Signed)
Discharge Summary    Patient ID: [name],  MRN: [id number], DOB/AGE: 04-Apr-1945 71 y.o.  Admit date: 09/16/2016 Discharge date: 09/21/2016  Primary Care Provider: Joaquim Nam Primary Cardiologist: Dr. Lewayne Bunting   Discharge Diagnoses    Principal Problem:   Ventricular tachycardia [facility]) Active Problems:   Chronic systolic heart failure (HCC)   Amiodarone pulmonary toxicity   Persistent atrial fibrillation [facility])   Physical deconditioning   Coronary artery disease involving native coronary artery of native heart without angina pectoris   Hyperkalemia   ICD (implantable cardioverter-defibrillator) discharge   DNR (do not resuscitate)    Allergies Allergies  Allergen Reactions  . Ace Inhibitors Other (See Comments)    muscle pain. Tolerates ARBs.   . Codeine Other (See Comments)    "head wants to explode."  . Doxycycline Diarrhea and Nausea And Vomiting  . Kionex [Sodium Polystyrene Sulfonate] Other (See Comments)    SOB, pressure in chest, weakness fatigue.   [name] Penicillins Swelling    "started at point of injection; w/in 3 min my upper arm was swollen 3 times normal" Has patient had a PCN reaction causing immediate rash, facial/tongue/throat swelling, SOB or lightheadedness with hypotension: Yes Has patient had a PCN reaction causing severe rash involving mucus membranes or skin necrosis: No Has patient had a PCN reaction that required hospitalization No Has patient had a PCN reaction occurring within the last 10 years: No If all of the above answers are "NO", then may proceed wi  . Lisinopril Other (See Comments)    Muscle Pain  . Statins Other (See Comments)    Myalgias per patient  . Hydrocodone Other (See Comments)    Severe headache, esp when combined with skelaxin.    . Lidocaine     Hallucinations, jerking  . Pacerone [Amiodarone] Other (See Comments)    Lung and heart problem (09/16/16 - pt is taking 200 mg daily with no reaction - states  reaction was to a higher dose)  . Xanax [Alprazolam] Other (See Comments)    Nightmares.      Diagnostic Studies/Procedures    Echo 09/18/16 - Left ventricle: The cavity size was normal. Wall thickness was   normal. Systolic function was severely reduced. The estimated   ejection fraction was in the range of 20% to 25%. Diffuse   hypokinesis with the septal wall being most preserved. Features   are consistent with a pseudonormal left ventricular filling   pattern, with concomitant abnormal relaxation and increased   filling pressure (grade 2 diastolic dysfunction). - Aortic valve: There was no stenosis. There was trivial   regurgitation. - Mitral valve: There was severe central regurgitation. - Left atrium: The atrium was severely dilated. - Right ventricle: The cavity size was mildly dilated. Pacer wire   or catheter noted in right ventricle. Systolic function was   moderately reduced. - Right atrium: The atrium was moderately dilated. - Tricuspid valve: Peak RV-RA gradient (S): 48 mm Hg. - Pulmonary arteries: PA peak pressure: 63 mm Hg (S). - Systemic veins: IVC measured 2.3 cm with < 50% respirophasic   variation, suggesting RA pressure 15 mmHg.  Impressions:  - Normal LV size with EF 20-25%, diffuse hypokinesis. Moderate   diastolic dysfunction. Mildly dilated RV with moderately   decreased systolic function. Severe LAE, moderate RAE. Severe   mitral regurgitation centrally directed, likely functional.   Moderate pulmonary hypertension.  _____________   History of Present Illness     Randy  L [name] is a 71 y.o. male with a history of chronic systolic HF, ischemic CM, CAD with chronic total occlusion of the proximal RCA and moderate LCx disease, Persistent AF on Apixaban for anticoagulation, prior GI bleed, multiple hospitalizations for pneumonia, ventricular tachycardia status post ICD implantation and catheter ablation for VT. He has a history of amiodarone lung  toxicity. He is on chronic O2. He has had recurrent ventricular tachycardia requiring reinitiation of amiodarone. He is also on mexiletine, ranolazine. He presented to the hospital on 09/16/16 with ICD shock 3. Device interrogation demonstrated 8 episodes of ventricular tachycardia.  Hospital Course     Consultants:  Dr. Sherryl Manges - Electrophysiology Dr. Arvilla Meres - Heart Failure  Yong Channel, NP - Palliative Care Physical Therapy  [name] was admitted for VT storm and placed on IV amiodarone. His Troponin levels were minimally elevated and flat.  He continued to have evidence of ventricular tachycardia. He was seen by Dr. Graciela Husbands for electrophysiology consultation.  His VT eventually resolved and the Amiodarone was switched back to oral formulation.  His Ranolazine was also increased to 1000 mg Twice daily.  He is on Apixaban 2.5 mg at his own request and declined to increase this to the recommended dose of 5 mg Twice daily.    He was evaluated by Dr. Gala Romney and the Advanced Heart Failure team to see if he is a candidate for any advanced therapies. He was not felt to be a candidate for transplant given his age. He was also not felt to be a candidate for LVAD given his pulmonary disease and the fact that an LVAD would not resolve his VT issues.  He was evaluated by Palliative Care.  [name] initially opted to remain full code and to pursue reasonable treatment options to remain comfortable.  However, he ultimately decided to be a DNR.  He did decide to keep his ICD turned on at this time.  He will be set up for home Hospice.    He was seen by physical therapy and DC to home was recommended.   He was evaluated by Dr. Sherryl Manges this morning and felt to be in stable condition for DC to home. His potassium is 5.2 today and he will need a repeat BMET next week at his follow up appointment.  He has been on iron replacement for unknown reasons and this was DC'd.  His Amiodarone dose  will be 400 mg Twice daily x 2 weeks, then 400 mg Once daily.   _____________  Discharge Vitals Blood pressure 111/78, pulse (!) 58, temperature 98.7 F (37.1 C), temperature source Axillary, resp. rate 18, height 5\' 3"  (1.6 m), weight 152 lb 9.6 oz (69.2 kg), SpO2 98 %.  Filed Weights   09/17/16 0633 09/20/16 0539 09/21/16 0609  Weight: 150 lb (68 kg) 152 lb 14.4 oz (69.4 kg) 152 lb 9.6 oz (69.2 kg)    Labs & Radiologic Studies    CBC No results for input(s): WBC, NEUTROABS, HGB, HCT, MCV, PLT in the last 72 hours. Basic Metabolic Panel  Recent Labs  09/20/16 0402 09/21/16 0342  NA 140 140  K 4.4 5.2*  CL 107 107  CO2 25 24  GLUCOSE 97 143*  BUN 21* 20  CREATININE 1.24 1.32*  CALCIUM 8.5* 8.7*   Liver Function Tests No results for input(s): AST, ALT, ALKPHOS, BILITOT, PROT, ALBUMIN in the last 72 hours. No results for input(s): LIPASE, AMYLASE in the last 72 hours. Cardiac Enzymes  No results for input(s): CKTOTAL, CKMB, CKMBINDEX, TROPONINI in the last 72 hours. BNP Invalid input(s): POCBNP D-Dimer No results for input(s): DDIMER in the last 72 hours. Hemoglobin A1C No results for input(s): HGBA1C in the last 72 hours. Fasting Lipid Panel No results for input(s): CHOL, HDL, LDLCALC, TRIG, CHOLHDL, LDLDIRECT in the last 72 hours. Thyroid Function Tests No results for input(s): TSH, T4TOTAL, T3FREE, THYROIDAB in the last 72 hours.  Invalid input(s): FREET3 _____________  Dg Chest Portable 1 View  Result Date: 09/16/2016 CLINICAL DATA:  ICD fired EXAM: PORTABLE CHEST 1 VIEW COMPARISON:  07/07/2016 FINDINGS: Left-sided pacing device similar compared to prior. Moderate cardiomegaly with mild central congestion. No focal consolidation or large effusion. No pneumothorax. IMPRESSION: Cardiomegaly with minimal central congestion. No acute infiltrate or edema. Electronically Signed   By: Jasmine Pang M.D.   On: 09/16/2016 21:20   Disposition   Pt is being discharged  home today in good condition.  Follow-up Plans & Appointments    Follow-up Information    Marinus Maw, MD Follow up in 1 week(s).   Specialty:  Cardiology Why:  follow up with Dr. [name] or Francis Dowse, PA-C or Gypsy Balsam, NP (on day Dr. [name] in the office) in 1-2 weeks. Contact information: [address] [place] [address] [phone]        [facility] [employer] [facility] Follow up in 1 week(s).   Specialty:  Cardiology Why:  we will arrange a blood test to recheck your potassium (BMET) Contact information: [address], [address] [place] [address] [phone]         Discharge Instructions    (HEART FAILURE PATIENTS) Call MD:  Anytime you have any of the following symptoms: 1) 3 pound weight gain in 24 hours or 5 pounds in 1 week 2) shortness of breath, with or without a dry hacking cough 3) swelling in the hands, feet or stomach 4) if you have to sleep on extra pillows at night in order to breathe.    Complete by:  As directed    Diet - low sodium heart healthy    Complete by:  As directed    Driving Restrictions    Complete by:  As directed    No driving   Increase activity slowly    Complete by:  As directed       Discharge Medications   Current Discharge Medication List    CONTINUE these medications which have CHANGED   Details  amiodarone (PACERONE) 400 MG tablet Take 1 tablet (400 mg total) by mouth 2 (two) times daily. for 2 weeks, then (on Sept 1) decrease to 1 tab (400 mg) daily Qty: 60 tablet, Refills: 11    loperamide (IMODIUM) 2 MG capsule Take 1 capsule (2 mg total) by mouth See admin instructions. Take 1 capsule (2 mg) by mouth after each bowel movement    nitroGLYCERIN (NITROSTAT) 0.4 MG SL tablet Place 1 tablet (0.4 mg total) under the tongue every 5 (five) minutes as needed for chest pain.    ranolazine (RANEXA) 1000 MG SR tablet Take 1 tablet (1,000 mg total) by mouth 2 (two) times  daily. Qty: 60 tablet, Refills: 11      CONTINUE these medications which have NOT CHANGED   Details  acetaminophen (TYLENOL) 325 MG tablet Take 2 tablets (650 mg total) by mouth every 6 (six) hours as needed for mild pain (or Fever >/= 101).    apixaban (  ELIQUIS) 2.5 MG TABS tablet Take 1 tablet (2.5 mg total) by mouth 2 (two) times daily. Qty: 180 tablet, Refills: 3    benzonatate (TESSALON) 200 MG capsule Take 1 capsule (200 mg total) by mouth every 4 (four) hours as needed for cough. Qty: 20 capsule, Refills: 0    budesonide-formoterol (SYMBICORT) 160-4.5 MCG/ACT inhaler Inhale 2 puffs into the lungs 2 (two) times daily. Qty: 1 Inhaler, Refills: 6    Chlorpheniramine-APAP (CORICIDIN) 2-325 MG TABS Take 2 tablets by mouth daily as needed (congestion/runny nose).    furosemide (LASIX) 20 MG tablet Take 1 tablet (20 mg total) by mouth every Monday, Wednesday, and Friday. Qty: 45 tablet, Refills: 3    Guaifenesin (MUCINEX MAXIMUM STRENGTH) 1200 MG TB12 Take 1,200 mg by mouth 2 (two) times daily.    levalbuterol (XOPENEX) 0.63 MG/3ML nebulizer solution Take 0.63 mg by nebulization every 4 (four) hours as needed for wheezing or shortness of breath.    metaxalone (SKELAXIN) 800 MG tablet Take 0.5 tablets (400 mg total) by mouth 3 (three) times daily as needed for muscle spasms.    mexiletine (MEXITIL) 150 MG capsule Take 2 capsules (300 mg total) by mouth every 8 (eight) hours. 8am, 3:30pm, 11:30pm Qty: 180 capsule, Refills: 11    OXYGEN Inhale 2 L into the lungs continuous.    PROAIR HFA 108 (90 Base) MCG/ACT inhaler INHALE 1-2 PUFFS INTO THE LUNGS EVERY 6 (SIX) HOURS AS NEEDED FOR WHEEZING OR SHORTNESS OF BREATH. Qty: 8.5 Inhaler, Refills: 0    promethazine (PHENERGAN) 25 MG tablet Take 25 mg by mouth every 6 (six) hours as needed for nausea or vomiting.    ranitidine (ZANTAC) 150 MG capsule Take 150 mg by mouth daily as needed for heartburn.     Simethicone (GAS-X PO) Take 1-2  capsules by mouth daily as needed (pressure/bloating/gas). As needed.     traMADol (ULTRAM) 50 MG tablet Take 1 tablet (50 mg total) by mouth every 8 (eight) hours as needed (for pain). Qty: 100 tablet, Refills: 1    zolpidem (AMBIEN) 10 MG tablet TAKE 0.5-1 TABLETS (5-10 MG TOTAL) BY MOUTH AT BEDTIME AS NEEDED FOR SLEEP Qty: 30 tablet, Refills: 5      STOP taking these medications     ferrous sulfate (FERROUSUL) 325 (65 FE) MG tablet      isosorbide mononitrate (IMDUR) 30 MG 24 hr tablet            Outstanding Labs/Studies   1. BMET 09/26/16  Duration of Discharge Encounter   Greater than 30 minutes including physician time.  Signed, Tereso Newcomer, PA-C    09/21/2016 12:13 PM

## 2016-09-21 NOTE — Progress Notes (Signed)
CARDIAC REHAB PHASE I   PRE:  Rate/Rhythm: 62 nsr  BP:  Sitting: 129/91      SaO2: 99 3 liters  MODE:  Ambulation: 200 ft   POST:  Rate/Rhythm: 74 nsr  BP:  Sitting: 144/78     SaO2: 85 3 liters. Slow recovery with sitting and PLB to 90  [phone] Patient ambulated in hallway x 1 assist to manage rolling O2 tank. Steady gait. No complaints however patient needed encouragement to ambulate farther than 135ft. Patient to be D/C today. HF packet given and reviewed. Concerned that patient does not eat any meals at home. Reviewed how increase NA intake causes fluid retention. Patient states he is unwilling to change lifestyle. Post ambulation patient back to bedside sitting with call bell in reach.  Jager Koska English PayneRN, BSN 09/21/2016 9:43 AM

## 2016-09-21 NOTE — Progress Notes (Signed)
Progress Note  Patient Name: [name] Date of Encounter: 09/21/2016  Primary Cardiologist: Dr. [name]  Subjective   Without chest pain   Sob stable [place] home  Questions aobut prognosis  And length of life  Tolerating amio  Inpatient Medications    Scheduled Meds: . amiodarone  400 mg Oral BID  . apixaban  2.5 mg Oral BID  . famotidine  10 mg Oral Daily  . ferrous sulfate  325 mg Oral BID  . furosemide  20 mg Oral Q M,W,F  . guaiFENesin  1,200 mg Oral BID  . mexiletine  300 mg Oral 3 times per day  . mometasone-formoterol  2 puff Inhalation BID  . ranolazine  1,000 mg Oral BID   Continuous Infusions:  PRN Meds: diphenhydrAMINE **AND** acetaminophen, acetaminophen, albuterol, benzonatate, loperamide, metaxalone, ondansetron (ZOFRAN) IV, promethazine, simethicone, traMADol, zolpidem   Vital Signs    Vitals:   09/20/16 2140 09/21/16 0609 09/21/16 0727 09/21/16 0734  BP: 116/70 111/78    Pulse: (!) 59 61  (!) 58  Resp:      Temp: 97.6 F (36.4 C) 98.7 F (37.1 C)    TempSrc: Axillary Axillary    SpO2: 96% 98% 100% 98%  Weight:  152 lb 9.6 oz (69.2 kg)    Height:        Intake/Output Summary (Last 24 hours) at 09/21/16 1046 Last data filed at 09/20/16 2100  Gross per 24 hour  Intake              240 ml  Output                0 ml  Net              240 ml   Filed Weights   09/17/16 0633 09/20/16 0539 09/21/16 0609  Weight: 150 lb (68 kg) 152 lb 14.4 oz (69.4 kg) 152 lb 9.6 oz (69.2 kg)    Telemetry    Sinus without VT  - Personally Reviewed  ECG    No new EKG - Personally Reviewed  Physical Exam   Well developed and nourished in no acute distress HENT normal Neck supple with JVP-flat Carotids brisk and full without bruits Clear Regular rate and rhythm, no murmurs or gallops Abd-soft with active BS without hepatomegaly No Clubbing cyanosis edema Skin-warm and dry A & Oriented  Grossly normal sensory and motor function    Labs      Chemistry  Recent Labs Lab 09/19/16 0243 09/20/16 0402 09/21/16 0342  NA 138 140 140  K 4.2 4.4 5.2*  CL 105 107 107  CO2 24 25 24   GLUCOSE 126* 97 143*  BUN 22* 21* 20  CREATININE 1.33* 1.24 1.32*  CALCIUM 8.4* 8.5* 8.7*  GFRNONAA 52* 56* 52*  GFRAA >60 >60 >60  ANIONGAP 9 8 9      Hematology  Recent Labs Lab 09/16/16 2011 09/16/16 2039 09/18/16 0235  WBC 7.9  --  9.1  RBC 4.75  --  4.73  HGB 12.3* 13.9 12.3*  HCT 40.5 41.0 40.8  MCV 85.3  --  86.3  MCH 25.9*  --  26.0  MCHC 30.4  --  30.1  RDW 22.4*  --  22.1*  PLT 351  --  302    Cardiac Enzymes  Recent Labs Lab 09/17/16 0040 09/17/16 0622  TROPONINI 0.04* 0.07*     Recent Labs Lab 09/16/16 2032  TROPIPOC 0.00     BNPNo results  for input(s): BNP, PROBNP in the last 168 hours.   DDimer No results for input(s): DDIMER in the last 168 hours.   Radiology    No new studies  Cardiac Studies   11/30/15: TTE Study Conclusions - Left ventricle: The cavity size was mildly dilated. Wall thickness was normal. Systolic function was severely reduced. The estimated ejection fraction was in the range of 25% to 30%. There is akinesis of the inferolateral and inferior myocardium. Features are consistent with a pseudonormal left ventricular filling pattern, with concomitant abnormal relaxation and increased filling pressure (grade 2 diastolic dysfunction). Doppler parameters are consistent with high ventricular filling pressure. - Aortic valve: There was mild regurgitation. - Mitral valve: There was severe regurgitation. - Left atrium: The atrium was moderately dilated. - Right atrium: The atrium was moderately dilated. - Tricuspid valve: There was moderate regurgitation. - Pulmonary arteries: Systolic pressure was moderately increased. Impressions: - Definity used; akinesis of the inferior and inferior lateral wall with overall severely reduced LV systolic function; grade  2 diastolic dysfunction with elevated LV filling pressure; biatrial enlargement; mild AI; severe MR; moderate TR with moderately elevated pulmonary pressure.  09/28/15: LHC Conclusion  Prox RCA to Mid RCA lesion, 100 %stenosed.  Ost LM to LM lesion, 30 %stenosed.  Mid Cx lesion, 70 %stenosed.  Ost 3rd Mrg to 3rd Mrg lesion, 75 %stenosed.  Prox LAD lesion, 35 %stenosed.  There is severe left ventricular systolic dysfunction.  LV end diastolic pressure is severely elevated.  The left ventricular ejection fraction is less than 25% by visual estimate.  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 1. 2 vessel obstructive CAD. There is chronic total occlusion of the proximal RCA. There is moderate disease in the distal LCx and third OM. This is unchanged from 2004. There is new aneurysmal dilation of the distal left main. 2. Severe LV dysfunction  3. Marked elevation of the LVEDP. Plan: continue medical therapy. He may benefit from additional diuresis.      Patient Profile     71 y.o. male hx of  CAD, mostly NICM w remote T RCA , lengthy hs of VT/VT storms w/ICD, persisitent A Fib, ICM s/p AICD, COPD, anxiety disorder, GIB, multiple hospitalizations for HCAP > ultimately felt to have developed pulm amio toxicity and remains chronically on O2 at home @ 2L, admitted yet again with recurrent VT/shocks  Assessment & Plan    1. VT storm, 3    2. CAD   3. Chronic SOB, worsened when in VT        4. NICM     5. PAFib     6 sinus brady   Plan discharge to home amio 400 bid x 2 weeks then 400 daily   Stop fe replacement  Needs recheck BMET next week    followup GT in office as scheduled

## 2016-09-23 ENCOUNTER — Other Ambulatory Visit: Payer: Self-pay

## 2016-09-23 ENCOUNTER — Telehealth: Payer: Self-pay | Admitting: *Deleted

## 2016-09-23 DIAGNOSIS — Z5181 Encounter for therapeutic drug level monitoring: Secondary | ICD-10-CM

## 2016-09-23 NOTE — Progress Notes (Signed)
duplicate

## 2016-09-23 NOTE — Telephone Encounter (Signed)
-----   Message from Beatrice Lecher, [place] sent at 09/21/2016 12:04 PM EDT ----- Regarding: needs TCM follow up (GT or day GT in office) Patient:  [name] [id number]  Primary Cardiologist:  Dr. Lewayne Bunting  DC from hospital on 09/21/2016  Please arrange FU in 1-2 weeks with Dr. [name] or Luster Landsberg or Joice Lofts on a day Dr. [name] is in the office. This is a TCM appointment. Please arrange a BMET 8/23 and send results to Dr. [name]. Signed,  Tereso Newcomer, PA-C   09/21/2016 12:04 PM

## 2016-09-23 NOTE — Telephone Encounter (Signed)
BMET needs to be ordered. Will need to forward to scheduling to schedule both BMET and TCM f/u as I only do TCM calls after appts are made. Thanks.

## 2016-09-24 ENCOUNTER — Ambulatory Visit (INDEPENDENT_AMBULATORY_CARE_PROVIDER_SITE_OTHER): Payer: Medicare Other | Admitting: Internal Medicine

## 2016-09-24 ENCOUNTER — Other Ambulatory Visit: Payer: Medicare Other | Admitting: *Deleted

## 2016-09-24 ENCOUNTER — Encounter: Payer: Self-pay | Admitting: Internal Medicine

## 2016-09-24 VITALS — BP 118/80 | HR 78 | Ht 63.0 in | Wt 154.0 lb

## 2016-09-24 DIAGNOSIS — I5022 Chronic systolic (congestive) heart failure: Secondary | ICD-10-CM

## 2016-09-24 DIAGNOSIS — I472 Ventricular tachycardia, unspecified: Secondary | ICD-10-CM

## 2016-09-24 DIAGNOSIS — I255 Ischemic cardiomyopathy: Secondary | ICD-10-CM

## 2016-09-24 MED ORDER — FUROSEMIDE 40 MG PO TABS: 40.0000 mg | ORAL_TABLET | Freq: Every day | ORAL | 3 refills | Status: DC

## 2016-09-24 MED ORDER — AMIODARONE HCL 200 MG PO TABS: 200.0000 mg | ORAL_TABLET | Freq: Two times a day (BID) | ORAL | 3 refills | Status: DC

## 2016-09-24 NOTE — Progress Notes (Signed)
HPI [name] returns today for ongoing evaluation and management of ventricular tachycardia and end-stage systolic heart failure. The patient is a 71 year old man with a long history of coronary artery disease status post inferior myocardial infarction, with progressive left ventricular dysfunction, ejection fraction 20%, pulmonary hypertension, and recurrent and refractory ventricular tachycardia. He has been on long-term amiodarone therapy which was initially stopped but then reinitiated. He has undergone VT ablation which was initially successful, but then had recurrent VT. He has had multiple hospitalizations for both ventricular tachycardia as well as congestive heart failure. In addition he has had a history of persistent atrial fibrillation although this has been well-controlled on amiodarone. He was hospitalized approximately one week ago with recurrent VT shocks. He was felt not to be a candidate for advanced therapies from a heart failure perspective and was turned down for both transplant because of his advanced age and an LVAD because of his multiple comorbidities including uncontrolled ventricular tachycardia. Hospice was consulted. The patient complains of increasing abdominal distention. He does have chronic renal insufficiency, stage 3-4. Allergies  Allergen Reactions  . Ace Inhibitors Other (See Comments)    muscle pain. Tolerates ARBs.   . Codeine Other (See Comments)    "head wants to explode."  . Doxycycline Diarrhea and Nausea And Vomiting  . Kionex [Sodium Polystyrene Sulfonate] Other (See Comments)    SOB, pressure in chest, weakness fatigue.   [name] Penicillins Swelling    "started at point of injection; w/in 3 min my upper arm was swollen 3 times normal" Has patient had a PCN reaction causing immediate rash, facial/tongue/throat swelling, SOB or lightheadedness with hypotension: Yes Has patient had a PCN reaction causing severe rash involving mucus membranes or skin  necrosis: No Has patient had a PCN reaction that required hospitalization No Has patient had a PCN reaction occurring within the last 10 years: No If all of the above answers are "NO", then may proceed wi  . Lisinopril Other (See Comments)    Muscle Pain  . Statins Other (See Comments)    Myalgias per patient  . Hydrocodone Other (See Comments)    Severe headache, esp when combined with skelaxin.    . Lidocaine     Hallucinations, jerking  . Pacerone [Amiodarone] Other (See Comments)    Lung and heart problem (09/16/16 - pt is taking 200 mg daily with no reaction - states reaction was to a higher dose)  . Xanax [Alprazolam] Other (See Comments)    Nightmares.       Current Outpatient Prescriptions  Medication Sig Dispense Refill  . acetaminophen (TYLENOL) 325 MG tablet Take 2 tablets (650 mg total) by mouth every 6 (six) hours as needed for mild pain (or Fever >/= 101).    [name] apixaban (ELIQUIS) 2.5 MG TABS tablet Take 1 tablet (2.5 mg total) by mouth 2 (two) times daily. 180 tablet 3  . benzonatate (TESSALON) 200 MG capsule Take 1 capsule (200 mg total) by mouth every 4 (four) hours as needed for cough. 20 capsule 0  . budesonide-formoterol (SYMBICORT) 160-4.5 MCG/ACT inhaler Inhale 2 puffs into the lungs 2 (two) times daily. 1 Inhaler 6  . Chlorpheniramine-APAP (CORICIDIN) 2-325 MG TABS Take 2 tablets by mouth daily as needed (congestion/runny nose).    . Guaifenesin (MUCINEX MAXIMUM STRENGTH) 1200 MG TB12 Take 1,200 mg by mouth 2 (two) times daily.    [name] levalbuterol (XOPENEX) 0.63 MG/3ML nebulizer solution Take 0.63 mg by nebulization every 4 (four)  hours as needed for wheezing or shortness of breath.    . loperamide (IMODIUM) 2 MG capsule Take 1 capsule (2 mg total) by mouth See admin instructions. Take 1 capsule (2 mg) by mouth after each bowel movement    . metaxalone (SKELAXIN) 800 MG tablet Take 0.5 tablets (400 mg total) by mouth 3 (three) times daily as needed for muscle spasms.     [name] mexiletine (MEXITIL) 150 MG capsule Take 2 capsules (300 mg total) by mouth every 8 (eight) hours. 8am, 3:30pm, 11:30pm 180 capsule 11  . nitroGLYCERIN (NITROSTAT) 0.4 MG SL tablet Place 1 tablet (0.4 mg total) under the tongue every 5 (five) minutes as needed for chest pain.    . OXYGEN Inhale 2 L into the lungs continuous.    [name] PROAIR HFA 108 (90 Base) MCG/ACT inhaler INHALE 1-2 PUFFS INTO THE LUNGS EVERY 6 (SIX) HOURS AS NEEDED FOR WHEEZING OR SHORTNESS OF BREATH. 8.5 Inhaler 0  . promethazine (PHENERGAN) 25 MG tablet Take 25 mg by mouth every 6 (six) hours as needed for nausea or vomiting.    . ranitidine (ZANTAC) 150 MG capsule Take 150 mg by mouth daily as needed for heartburn.     . ranolazine (RANEXA) 1000 MG SR tablet Take 1 tablet (1,000 mg total) by mouth 2 (two) times daily. 60 tablet 11  . Simethicone (GAS-X PO) Take 1-2 capsules by mouth daily as needed (pressure/bloating/gas). As needed.     . traMADol (ULTRAM) 50 MG tablet Take 1 tablet (50 mg total) by mouth every 8 (eight) hours as needed (for pain). 100 tablet 1  . zolpidem (AMBIEN) 10 MG tablet TAKE 0.5-1 TABLETS (5-10 MG TOTAL) BY MOUTH AT BEDTIME AS NEEDED FOR SLEEP 30 tablet 5  . amiodarone (PACERONE) 200 MG tablet Take 1 tablet (200 mg total) by mouth 2 (two) times daily. 90 tablet 3  . furosemide (LASIX) 40 MG tablet Take 1 tablet (40 mg total) by mouth daily. 90 tablet 3   No current facility-administered medications for this visit.    Facility-Administered Medications Ordered in Other Visits  Medication Dose Route Frequency Provider Last Rate Last Dose  . sodium chloride 0.9 % injection 3 mL  3 mL Intravenous Q12H Marinus Maw, MD      . sodium chloride 0.9 % injection 3 mL  3 mL Intravenous PRN Marinus Maw, MD         Past Medical History:  Diagnosis Date  . AICD (automatic cardioverter/defibrillator) present   . Allergic rhinitis, cause unspecified   . Anxiety   . Arthritis    "all over" (11/07/2015)   . Atrial fibrillation (HCC)    a. 05/2015 - converted to sinus in setting of ICD shocks; placed on eliquis 5 bid.  [name] CAD (coronary artery disease)   . Cervical herniated disc    told not to lift >10 lbs  . Chronic systolic CHF (congestive heart failure), NYHA class 2 (HCC)    Reports EF of 25%.   [name] COPD (chronic obstructive pulmonary disease) (HCC) 11/2012   by xray  . Diverticulosis    by CT scan  . HCAP (healthcare-associated pneumonia) 11/06/2015  . History of lower GI bleeding 12/11/2011   "first time" (12/11/2011)  . History of MI (myocardial infarction) 1995   Pt living in [place], no stent, ?PTCA  . Hypertension   . Insomnia   . Ischemic cardiomyopathy    Echo 8/18: EF 20-25, diffuse HK, grade 2 DD, trivial AI,  severe central MR, severe LAE, moderately reduced RVSF, moderate RAE, PASP 63  . Perennial allergic rhinitis    only to dust mites  . Pneumonia 2000s   "walking pneumonia"  . VT (ventricular tachycardia) (HCC)    a. 05/2015 - VT storm with multiple ICD shocks-->Amio 400 BID.    ROS:   All systems reviewed and negative except as noted in the HPI.   Past Surgical History:  Procedure Laterality Date  . CARDIAC CATHETERIZATION  2004   LAD 30%, D1 30%, CFX-AV groove 70-80%, OM1 30%, EF 20-25%  . CARDIAC CATHETERIZATION N/A 09/28/2015   Procedure: Left Heart Cath and Coronary Angiography;  Surgeon: Peter M [place], MD;  Location: [facility] INVASIVE CV LAB;  Service: Cardiovascular;  Laterality: N/A;  . CARDIAC DEFIBRILLATOR PLACEMENT  2004  . CATARACT EXTRACTION W/ INTRAOCULAR LENS IMPLANT Right 01/2012  . COLONOSCOPY  01/08/2012   Procedure: COLONOSCOPY;  Surgeon: Iva Boop, MD;  Location: [facility] ENDOSCOPY;  Service: Endoscopy;  Laterality: N/A;  . CORONARY ANGIOPLASTY  1995   Pt thinks he got a balloon, living in [place], [place]  . IMPLANTABLE CARDIOVERTER DEFIBRILLATOR GENERATOR CHANGE N/A 02/07/2012   Procedure: IMPLANTABLE CARDIOVERTER DEFIBRILLATOR GENERATOR CHANGE;   Surgeon: Marinus Maw, MD; Medtronic Evera XT VR single-chamber serial number [id number], Laterality: Left  . INSERT / REPLACE / REMOVE PACEMAKER  2004   Medtronic ICD  . KNEE ARTHROSCOPY Left 05/2003   Hattie Perch 06/19/2010  . LAPAROSCOPIC CHOLECYSTECTOMY  1/ 2012  . SHOULDER ARTHROSCOPY W/ ROTATOR CUFF REPAIR Right twice  . TONSILLECTOMY AND ADENOIDECTOMY  ~ 1951  . V TACH ABLATION N/A 04/29/2016   Procedure: V Tach Ablation;  Surgeon: Marinus Maw, MD;  Location: [facility] INVASIVE CV LAB;  Service: Cardiovascular;  Laterality: N/A;     Family History  Problem Relation Age of Onset  . Diabetes Father   . Tracheal cancer Father 34       smoker  . Stroke Mother   . Cancer Sister        left eye  . CAD Neg Hx   . Colon cancer Neg Hx   . Prostate cancer Neg Hx      Social History   Social History  . Marital status: Married    Spouse name: N/A  . Number of children: 0  . Years of education: N/A   Occupational History  . [employer] truck driver (retired)    Social History Main Topics  . Smoking status: Former Smoker    Packs/day: 0.50    Years: 50.00    Types: Cigarettes, Cigars    Quit date: 09/27/2015  . Smokeless tobacco: Never Used  . Alcohol use No  . Drug use: No  . Sexual activity: Not Currently   Other Topics Concern  . Not on file   Social History Narrative   Lives with wife, married 1998   Grown children, 2 great grandchildren   Occupation: retired, was Presenter, broadcasting   Activity: walking, fishing   Diet: good water daily, fruits/vegetables rare      Wife is Product manager.    [id number], Human resources officer. No known agent orange exposure.       BP 118/80   Pulse 78   Ht 5\' 3"  (1.6 m)   Wt 154 lb (69.9 kg)   SpO2 99%   BMI 27.28 kg/m   Physical Exam:  Chronically ill appearing 71 year old man, NAD HEENT: Unremarkable Neck:  8 cm JVD, no thyromegally Lymphatics:  No  adenopathy Back:  No CVA tenderness Lungs:  Clear, except for rales approximately one third up  bilaterally. No increased work of breathing HEART:  Regular rate rhythm, no murmurs, no rubs, no clicks Abd:  soft, protuberant, positive bowel sounds, no organomegally, no rebound, no guarding Ext:  2 plus pulses, 1+ peripheral edema, no cyanosis, no clubbing Skin:  No rashes no nodules Neuro:  CN II through XII intact, motor grossly intact  EKG - none today  DEVICE - his device was not interrogated today.   Assess/Plan: 1. Ventricular tachycardia - the patient has very limited options at this point. He will continue amiodarone therapy with plans to reduce the dose from 400 mg twice a day to 400 mg daily in approximately 10 days. At some point, I expect his ventricular tachycardia to be refractory to ICD therapy at that point he would likely expire. 2. Chronic systolic heart failure - he appears to be volume overloaded today. I've asked the patient to increase his Lasix to 40 mg daily for 2 weeks. We'll check labs at that point and decide on whether to continue the current dose of Lasix, increase it, or decrease it. 3. Paroxysmal atrial fibrillation - he is maintaining sinus rhythm. He will continue low-dose oral anticoagulant therapy. He is not on full strength but had bleeding on 5 mg twice daily of Eliquis 4. Hypertensive heart disease - previously his blood pressure was poorly controlled, but appears to be well-controlled at the present time. He is encouraged to maintain a low-salt diet.  Lewayne Bunting, M.D.

## 2016-09-24 NOTE — Patient Instructions (Signed)
Medication Instructions:  Your physician has recommended you make the following change in your medication: 1.  Lasix take 40 mg by mouth daily. 2.  Amiodarone take 200 mg by mouth twice a day.     Labwork: Repeat BMET in 2 weeks.  Testing/Procedures: None ordered.   Follow-Up: Your physician wants you to follow-up in: 3 months with Dr. [name].   You will receive a reminder letter in the mail two months in advance. If you don't receive a letter, please call our office to schedule the follow-up appointment.  Remote monitoring is used to monitor your ICD from home. This monitoring reduces the number of office visits required to check your device to one time per year. It allows [place] to keep an eye on the functioning of your device to ensure it is working properly. You are scheduled for a device check from home on 11/27/2016. You may send your transmission at any time that day. If you have a wireless device, the transmission will be sent automatically. After your physician reviews your transmission, you will receive a postcard with your next transmission date.     Any Other Special Instructions Will Be Listed Below (If Applicable).     If you need a refill on your cardiac medications before your next appointment, please call your pharmacy.

## 2016-09-25 LAB — BASIC METABOLIC PANEL
BUN/Creatinine Ratio: 16 (ref 10–24)
BUN: 20 mg/dL (ref 8–27)
CALCIUM: 8.4 mg/dL — AB (ref 8.6–10.2)
CHLORIDE: 104 mmol/L (ref 96–106)
CO2: 21 mmol/L (ref 20–29)
Creatinine, Ser: 1.22 mg/dL (ref 0.76–1.27)
GFR, EST AFRICAN AMERICAN: 68 mL/min/{1.73_m2} (ref >59)
GFR, EST NON AFRICAN AMERICAN: 59 mL/min/{1.73_m2} — AB (ref >59)
Glucose: 83 mg/dL (ref 65–99)
POTASSIUM: 4.1 mmol/L (ref 3.5–5.2)
SODIUM: 143 mmol/L (ref 134–144)

## 2016-10-02 ENCOUNTER — Telehealth: Payer: Self-pay | Admitting: *Deleted

## 2016-10-02 DIAGNOSIS — I50814 Right heart failure due to left heart failure: Secondary | ICD-10-CM | POA: Diagnosis not present

## 2016-10-02 DIAGNOSIS — I2510 Atherosclerotic heart disease of native coronary artery without angina pectoris: Secondary | ICD-10-CM | POA: Diagnosis not present

## 2016-10-02 DIAGNOSIS — I481 Persistent atrial fibrillation: Secondary | ICD-10-CM | POA: Diagnosis not present

## 2016-10-02 DIAGNOSIS — I5022 Chronic systolic (congestive) heart failure: Secondary | ICD-10-CM | POA: Diagnosis not present

## 2016-10-02 DIAGNOSIS — I472 Ventricular tachycardia: Secondary | ICD-10-CM | POA: Diagnosis not present

## 2016-10-02 DIAGNOSIS — I509 Heart failure, unspecified: Secondary | ICD-10-CM | POA: Diagnosis not present

## 2016-10-02 NOTE — Telephone Encounter (Signed)
LMTCB/sss  ?Alert tone for unsuccessful CL transmission

## 2016-10-03 ENCOUNTER — Other Ambulatory Visit: Payer: Medicare Other

## 2016-10-03 ENCOUNTER — Ambulatory Visit (INDEPENDENT_AMBULATORY_CARE_PROVIDER_SITE_OTHER): Admitting: Pulmonary Disease

## 2016-10-03 VITALS — BP 108/72 | HR 55

## 2016-10-03 DIAGNOSIS — I255 Ischemic cardiomyopathy: Secondary | ICD-10-CM | POA: Diagnosis not present

## 2016-10-03 DIAGNOSIS — J8410 Pulmonary fibrosis, unspecified: Secondary | ICD-10-CM

## 2016-10-03 DIAGNOSIS — J9601 Acute respiratory failure with hypoxia: Secondary | ICD-10-CM

## 2016-10-03 NOTE — Progress Notes (Addendum)
[name]    [id number]    10/31/1945  Primary Care Physician:Duncan, Elveria Rising, MD  Referring Physician: Tonia Ghent, MD [address] [place], [place] [address]  Chief complaint:  Follow up for  Emphysema Amiodarone toxicity. Atrial fibrillation, VT  HPI: 71 y.o. male with extensive cardiac history and several admissions for HCAP which he failed several out patient antibiotic treatments. Admitted on 11/28/15 with SOB, hypoxia and worsening diffuse bilateral infiltrates. He had immunologic workup which showed borderline CCP and rheumatoid factor. He was was on amiodarone from April to October of 2017 and was diagnosed with amiodarone toxicity. He got taken off amio at that point and got a slow prednisone taper which was completed on Feb of 2018. He continues on supplemental oxygen.   He had an admission in early June 2018 for arrhythmia and was restarted on amio on for persistent VT. As per the cardiology notes there are no good options for him to control the arrythmia. Sotalol has been stopped and he continues on the mexiletine. He was initially on amio 200 mg bid which has been reduced to 200 mg daily.   Interim History: He had a repeat admission in mid August for recurrent refractory VT. He was placed on IV amiodarone but continued to have issues. He was evaluated by heart failure team and was not felt to be a candidate for any advanced therapy. He was also seen by palliative care and has transitioned to home hospice.  He has deferred further lung testing including pulmonary function tests. He does not have any new respiratory complaints. He feels that his breathing is better, denies any chest pain, cough, wheezing.  Outpatient Encounter Prescriptions as of 10/03/2016  Medication Sig  . acetaminophen (TYLENOL) 325 MG tablet Take 2 tablets (650 mg total) by mouth every 6 (six) hours as needed for mild pain (or Fever >/= 101).  [name] amiodarone (PACERONE) 200 MG  tablet Take 1 tablet (200 mg total) by mouth 2 (two) times daily. (Patient taking differently: Take 200 mg by mouth 2 (two) times daily. Pt currently doing 459m until September 1.)  . apixaban (ELIQUIS) 2.5 MG TABS tablet Take 1 tablet (2.5 mg total) by mouth 2 (two) times daily.  . benzonatate (TESSALON) 200 MG capsule Take 1 capsule (200 mg total) by mouth every 4 (four) hours as needed for cough.  . budesonide-formoterol (SYMBICORT) 160-4.5 MCG/ACT inhaler Inhale 2 puffs into the lungs 2 (two) times daily.  . Chlorpheniramine-APAP (CORICIDIN) 2-325 MG TABS Take 2 tablets by mouth daily as needed (congestion/runny nose).  . furosemide (LASIX) 40 MG tablet Take 1 tablet (40 mg total) by mouth daily.  . Guaifenesin (MUCINEX MAXIMUM STRENGTH) 1200 MG TB12 Take 1,200 mg by mouth 2 (two) times daily.  .Marland Kitchenlevalbuterol (XOPENEX) 0.63 MG/3ML nebulizer solution Take 0.63 mg by nebulization every 4 (four) hours as needed for wheezing or shortness of breath.  . loperamide (IMODIUM) 2 MG capsule Take 1 capsule (2 mg total) by mouth See admin instructions. Take 1 capsule (2 mg) by mouth after each bowel movement  . metaxalone (SKELAXIN) 800 MG tablet Take 0.5 tablets (400 mg total) by mouth 3 (three) times daily as needed for muscle spasms.  .Marland Kitchenmexiletine (MEXITIL) 150 MG capsule Take 2 capsules (300 mg total) by mouth every 8 (eight) hours. 8am, 3:30pm, 11:30pm  . nitroGLYCERIN (NITROSTAT) 0.4 MG SL tablet Place 1 tablet (0.4 mg total) under the tongue  every 5 (five) minutes as needed for chest pain.  . OXYGEN Inhale 2 L into the lungs continuous.  [name] PROAIR HFA 108 (90 Base) MCG/ACT inhaler INHALE 1-2 PUFFS INTO THE LUNGS EVERY 6 (SIX) HOURS AS NEEDED FOR WHEEZING OR SHORTNESS OF BREATH.  . promethazine (PHENERGAN) 25 MG tablet Take 25 mg by mouth every 6 (six) hours as needed for nausea or vomiting.  . ranitidine (ZANTAC) 150 MG capsule Take 150 mg by mouth daily as needed for heartburn.   . ranolazine  (RANEXA) 1000 MG SR tablet Take 1 tablet (1,000 mg total) by mouth 2 (two) times daily.  . Simethicone (GAS-X PO) Take 1-2 capsules by mouth daily as needed (pressure/bloating/gas). As needed.   . traMADol (ULTRAM) 50 MG tablet Take 1 tablet (50 mg total) by mouth every 8 (eight) hours as needed (for pain).  [name] zolpidem (AMBIEN) 10 MG tablet TAKE 0.5-1 TABLETS (5-10 MG TOTAL) BY MOUTH AT BEDTIME AS NEEDED FOR SLEEP   Facility-Administered Encounter Medications as of 10/03/2016  Medication  . sodium chloride 0.9 % injection 3 mL  . sodium chloride 0.9 % injection 3 mL    Allergies as of 10/03/2016 - Review Complete 10/03/2016  Allergen Reaction Noted  . Ace inhibitors Other (See Comments) 05/02/2010  . Codeine Other (See Comments)   . Doxycycline Diarrhea and Nausea And Vomiting 11/17/2015  . Kionex [sodium polystyrene sulfonate] Other (See Comments) 02/28/2016  . Penicillins Swelling   . Lisinopril Other (See Comments) 09/22/2014  . Statins Other (See Comments) 08/17/2013  . Hydrocodone Other (See Comments) 05/16/2016  . Lidocaine  03/10/2016  . Pacerone [amiodarone] Other (See Comments) 03/18/2016  . Xanax [alprazolam] Other (See Comments) 05/16/2016    Past Medical History:  Diagnosis Date  . AICD (automatic cardioverter/defibrillator) present   . Allergic rhinitis, cause unspecified   . Anxiety   . Arthritis    "all over" (11/07/2015)  . Atrial fibrillation ([place])    a. 05/2015 - converted to sinus in setting of ICD shocks; placed on eliquis 5 bid.  [name] CAD (coronary artery disease)   . Cervical herniated disc    told not to lift >10 lbs  . Chronic systolic CHF (congestive heart failure), NYHA class 2 (HCC)    Reports EF of 25%.   [name] COPD (chronic obstructive pulmonary disease) ([place] [facility]) 11/2012   by xray  . Diverticulosis    by CT scan  . HCAP (healthcare-associated pneumonia) 11/06/2015  . History of lower GI bleeding 12/11/2011   "first time" (12/11/2011)  . History of MI  (myocardial infarction) 1995   Pt living in [place], no stent, ?PTCA  . Hypertension   . Insomnia   . Ischemic cardiomyopathy    Echo 8/18: EF 20-25, diffuse HK, grade 2 DD, trivial AI, severe central MR, severe LAE, moderately reduced RVSF, moderate RAE, PASP 63  . Perennial allergic rhinitis    only to dust mites  . Pneumonia 2000s   "walking pneumonia"  . VT (ventricular tachycardia) ([place])    a. 05/2015 - VT storm with multiple ICD shocks-->Amio 400 BID.    Past Surgical History:  Procedure Laterality Date  . CARDIAC CATHETERIZATION  2004   LAD 30%, D1 30%, CFX-AV groove 70-80%, OM1 30%, EF 20-25%  . CARDIAC CATHETERIZATION N/A 09/28/2015   Procedure: Left Heart Cath and Coronary Angiography;  Surgeon: Peter M Martinique, MD;  Location: [facility];  Service: Cardiovascular;  Laterality: N/A;  . CARDIAC DEFIBRILLATOR PLACEMENT  2004  .  CATARACT EXTRACTION W/ INTRAOCULAR LENS IMPLANT Right 01/2012  . COLONOSCOPY  01/08/2012   Procedure: COLONOSCOPY;  Surgeon: Gatha Mayer, MD;  Location: [facility];  Service: Endoscopy;  Laterality: N/A;  . CORONARY ANGIOPLASTY  1995   Pt thinks he got a balloon, living in [place], [place] N/A 02/07/2012   Procedure: IMPLANTABLE CARDIOVERTER DEFIBRILLATOR GENERATOR CHANGE;  Surgeon: Evans Lance, MD; Medtronic Evera XT VR single-chamber serial number [id number], Laterality: Left  . INSERT / REPLACE / REMOVE PACEMAKER  2004   Medtronic ICD  . KNEE ARTHROSCOPY Left 05/2003   Archie Endo 06/19/2010  . LAPAROSCOPIC CHOLECYSTECTOMY  1/ 2012  . SHOULDER ARTHROSCOPY W/ ROTATOR CUFF REPAIR Right twice  . TONSILLECTOMY AND ADENOIDECTOMY  ~ 1951  . V TACH ABLATION N/A 04/29/2016   Procedure: V Tach Ablation;  Surgeon: Evans Lance, MD;  Location: [facility];  Service: Cardiovascular;  Laterality: N/A;    Family History  Problem Relation Age of Onset  . Diabetes Father   . Tracheal cancer  Father 30       smoker  . Stroke Mother   . Cancer Sister        left eye  . CAD Neg Hx   . Colon cancer Neg Hx   . Prostate cancer Neg Hx     Social History   Social History  . Marital status: Married    Spouse name: N/A  . Number of children: 0  . Years of education: N/A   Occupational History  . [employer] truck driver (retired)    Social History Main Topics  . Smoking status: Former Smoker    Packs/day: 0.50    Years: 50.00    Types: Cigarettes, Cigars    Quit date: 09/27/2015  . Smokeless tobacco: Never Used  . Alcohol use No  . Drug use: No  . Sexual activity: Not Currently   Other Topics Concern  . Not on file   Social History Narrative   Lives with wife, married 1998   Grown children, 2 great grandchildren   Occupation: retired, was Probation officer   Activity: walking, fishing   Diet: good water daily, fruits/vegetables rare      Wife is Economist.    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 No known agent orange exposure.     Review of systems: Review of Systems  Constitutional: Negative for fever and chills.  HENT: Negative.   Eyes: Negative for blurred vision.  Respiratory: as per HPI  Cardiovascular: Negative for chest pain and palpitations.  Gastrointestinal: Negative for vomiting, diarrhea, blood per rectum. Genitourinary: Negative for dysuria, urgency, frequency and hematuria.  Musculoskeletal: Negative for myalgias, back pain and joint pain.  Skin: Negative for itching and rash.  Neurological: Negative for dizziness, tremors, focal weakness, seizures and loss of consciousness.  Endo/Heme/Allergies: Negative for environmental allergies.  Psychiatric/Behavioral: Negative for depression, suicidal ideas and hallucinations.  All other systems reviewed and are negative.  Physical Exam: Blood pressure 108/72, pulse (!) 55, SpO2 92 %. Gen:      No acute distress HEENT:  EOMI, sclera anicteric Neck:     No masses; no thyromegaly Lungs:    Clear to auscultation bilaterally;  normal respiratory effort CV:         Regular rate and rhythm; no murmurs Abd:      + bowel sounds; soft, non-tender; no palpable masses, no distension Ext:    No edema; adequate peripheral perfusion Skin:  Warm and dry; no rash Neuro: alert and oriented x 3 Psych: normal mood and affect  Data Reviewed: Imaging LHC 09/28/15 >> Prox RCA lesion 100%, Mid cx 70%. EF 25% estimated. Continue medical therapy, no PCI. CT Chest 11/17/15 >> Multi focal infiltrate involving the lower lobes, left greater than right, and the left upper lobe is most likely an infectious or inflammatory process. Recommend short-term follow-up to ensure resolution. 4 mm nodule in the right upper lobe. Recommend attention on short-term follow-up. TTE 11/30/15 >> EF 25-30% w/ akinesis of inferolateral & inferior myocardium. Grade 2 diastolic dysfunction. RV normal in size and function. CXR 12/19/15 >> small improvement in bilateral opacities. CT high res 08/20/16- basal fibrosis without honeycombing consistent with NSIP. This has not changed since 12/2015 All images personally reviewed.  CP Serologies: CRP: 10.9 ESR: 40 PR-3: <3.5 DS DNA Ab: <1 MPO: <9 RF: 12.1 C3: 78 C4: 9 ANA: Negative ANCA:  Negative Anti-CCP:  19  Assessment:  Emphysema  He is a heavy smoker with emphysematous changes noted on CT scan. The etiology of his increased dyspnea may be due to a combination of COPD, lung fibrosis and heart disease.  We'll continue on the Symbicort for now. We have decided to hold off on PFTs since he has transitioned to home hospice.  Amiodarone toxicity, ? Unspecified ILD He has been restarted on amio recently. I have reviewed his high res CT today which shows baseline fibrosis in NSIP pattern which is unchanged since 12/2015. There are no acute findings to suggest he has recurrence of amio toxicity.    Previous autoimmune workup showed borderline CCP and rheumatoid factor. Repeat serologies show normal  CCP and borderline rheumatoid factor but he does not have any joint symptoms. At this point there is no clear evidence of connective tissue disease causuing lung fibrosis. We will continue to follow this.  Code status Home hospice for terminal heart disease with recurrent VT  Plan/Recommendations: - Continue supplemental oxygen and Symbicort                                         Marshell Garfinkel MD [facility] Pager [phone] 10/03/2016, 4:55 PM  CC: Tonia Ghent, MD

## 2016-10-03 NOTE — Patient Instructions (Signed)
Continue your inhalers as prescribed  Follow-up in 6 months.

## 2016-10-04 ENCOUNTER — Encounter: Payer: Self-pay | Admitting: Pulmonary Disease

## 2016-10-04 ENCOUNTER — Telehealth: Payer: Self-pay | Admitting: Internal Medicine

## 2016-10-04 NOTE — Telephone Encounter (Signed)
New message     Pt was admitted into hospice 2 days ago, they can not reach the patient and he is not returning any calls.  Per nurse they are going to do a well check up and drive to his house

## 2016-10-04 NOTE — Telephone Encounter (Signed)
Tina from Hospice stated she needed an order for DNR. Informed Inetta Fermo that usually the PCP handles HH and Hospice orders. Inetta Fermo stated patient told her that Dr. [name] is his PCP.  Informed Inetta Fermo that Dr. Para March is listed as PCP. Looking at Dr. Lubertha Basque office visit note from 09/24/16, there is no DNR or Hospice noted in patient's assessment/plan. Per Care Management Note on 09/21/16, patient would be discharged from hospital with Hospice Care. Will forward to Dr. [name] to see if he signed any DNR orders, and to clarify who is to address hospice orders.

## 2016-10-04 NOTE — Telephone Encounter (Signed)
Left message for [name] with Hospice to call back. Patient has a device will send to device as well.

## 2016-10-04 NOTE — Telephone Encounter (Signed)
Follow up     [name] is returning call to [facility]. Please call.

## 2016-10-04 NOTE — Telephone Encounter (Signed)
Left message for patient to call back  

## 2016-10-05 DIAGNOSIS — I509 Heart failure, unspecified: Secondary | ICD-10-CM | POA: Diagnosis not present

## 2016-10-05 DIAGNOSIS — I472 Ventricular tachycardia: Secondary | ICD-10-CM | POA: Diagnosis not present

## 2016-10-05 DIAGNOSIS — I50814 Right heart failure due to left heart failure: Secondary | ICD-10-CM | POA: Diagnosis not present

## 2016-10-05 DIAGNOSIS — I481 Persistent atrial fibrillation: Secondary | ICD-10-CM | POA: Diagnosis not present

## 2016-10-05 DIAGNOSIS — I2510 Atherosclerotic heart disease of native coronary artery without angina pectoris: Secondary | ICD-10-CM | POA: Diagnosis not present

## 2016-10-05 DIAGNOSIS — I5022 Chronic systolic (congestive) heart failure: Secondary | ICD-10-CM | POA: Diagnosis not present

## 2016-10-08 ENCOUNTER — Other Ambulatory Visit (INDEPENDENT_AMBULATORY_CARE_PROVIDER_SITE_OTHER)

## 2016-10-08 ENCOUNTER — Other Ambulatory Visit: Payer: Medicare Other

## 2016-10-08 DIAGNOSIS — I481 Persistent atrial fibrillation: Secondary | ICD-10-CM | POA: Diagnosis not present

## 2016-10-08 DIAGNOSIS — I5022 Chronic systolic (congestive) heart failure: Secondary | ICD-10-CM | POA: Diagnosis not present

## 2016-10-08 DIAGNOSIS — I472 Ventricular tachycardia, unspecified: Secondary | ICD-10-CM

## 2016-10-08 DIAGNOSIS — I2510 Atherosclerotic heart disease of native coronary artery without angina pectoris: Secondary | ICD-10-CM | POA: Diagnosis not present

## 2016-10-08 DIAGNOSIS — I50814 Right heart failure due to left heart failure: Secondary | ICD-10-CM | POA: Diagnosis not present

## 2016-10-08 DIAGNOSIS — D509 Iron deficiency anemia, unspecified: Secondary | ICD-10-CM | POA: Diagnosis not present

## 2016-10-08 DIAGNOSIS — I509 Heart failure, unspecified: Secondary | ICD-10-CM | POA: Diagnosis not present

## 2016-10-08 LAB — IBC PANEL
IRON: 47 ug/dL (ref 42–165)
SATURATION RATIOS: 10.8 % — AB (ref 20.0–50.0)
Transferrin: 310 mg/dL (ref 212.0–360.0)

## 2016-10-08 LAB — CBC WITH DIFFERENTIAL/PLATELET
BASOS ABS: 0.1 10*3/uL (ref 0.0–0.1)
Basophils Relative: 1.2 % (ref 0.0–3.0)
Eosinophils Absolute: 0.5 10*3/uL (ref 0.0–0.7)
Eosinophils Relative: 5 % (ref 0.0–5.0)
HCT: 45.6 % (ref 39.0–52.0)
Hemoglobin: 14.5 g/dL (ref 13.0–17.0)
LYMPHS ABS: 2.1 10*3/uL (ref 0.7–4.0)
Lymphocytes Relative: 22.2 % (ref 12.0–46.0)
MCHC: 31.7 g/dL (ref 30.0–36.0)
MCV: 84.7 fl (ref 78.0–100.0)
MONO ABS: 0.8 10*3/uL (ref 0.1–1.0)
Monocytes Relative: 8 % (ref 3.0–12.0)
NEUTROS ABS: 6.1 10*3/uL (ref 1.4–7.7)
NEUTROS PCT: 63.6 % (ref 43.0–77.0)
PLATELETS: 341 10*3/uL (ref 150.0–400.0)
RBC: 5.39 Mil/uL (ref 4.22–5.81)
RDW: 20.6 % — ABNORMAL HIGH (ref 11.5–15.5)
WBC: 9.6 10*3/uL (ref 4.0–10.5)

## 2016-10-08 NOTE — Addendum Note (Signed)
Addended by: Alvina Chou on: 10/08/2016 03:57 PM   Modules accepted: Orders

## 2016-10-09 NOTE — Telephone Encounter (Signed)
Call placed to wife.  Per wife, Pt would like this nurse to call Pt on his phone.  Wife states phone is next to husband.  Call placed to Pt.  Call went immediately to VM.  Left detailed message letting Pt know Dr. [name] signed his hospice orders.  Requested verbal confirmation that Pt would like Dr. [name] to complete and sign a DNR order.  Left this nurse name and # for call back.  Will await confirmation prior to completing DNR order.

## 2016-10-09 NOTE — Telephone Encounter (Signed)
Left message on wife VM requesting call back.

## 2016-10-09 NOTE — Telephone Encounter (Signed)
Follow Up:      Returning your call from today. 

## 2016-10-11 ENCOUNTER — Other Ambulatory Visit: Payer: Self-pay | Admitting: Family Medicine

## 2016-10-11 MED ORDER — FERROUS SULFATE 325 (65 FE) MG PO TABS: 325.0000 mg | ORAL_TABLET | Freq: Every day | ORAL | Status: AC

## 2016-10-14 ENCOUNTER — Other Ambulatory Visit: Payer: Self-pay | Admitting: Internal Medicine

## 2016-10-14 DIAGNOSIS — I472 Ventricular tachycardia: Secondary | ICD-10-CM | POA: Diagnosis not present

## 2016-10-14 DIAGNOSIS — I481 Persistent atrial fibrillation: Secondary | ICD-10-CM | POA: Diagnosis not present

## 2016-10-14 DIAGNOSIS — I509 Heart failure, unspecified: Secondary | ICD-10-CM | POA: Diagnosis not present

## 2016-10-14 DIAGNOSIS — I2510 Atherosclerotic heart disease of native coronary artery without angina pectoris: Secondary | ICD-10-CM | POA: Diagnosis not present

## 2016-10-14 DIAGNOSIS — I5022 Chronic systolic (congestive) heart failure: Secondary | ICD-10-CM | POA: Diagnosis not present

## 2016-10-14 DIAGNOSIS — I50814 Right heart failure due to left heart failure: Secondary | ICD-10-CM | POA: Diagnosis not present

## 2016-10-16 DIAGNOSIS — I50814 Right heart failure due to left heart failure: Secondary | ICD-10-CM | POA: Diagnosis not present

## 2016-10-16 DIAGNOSIS — I481 Persistent atrial fibrillation: Secondary | ICD-10-CM | POA: Diagnosis not present

## 2016-10-16 DIAGNOSIS — I2510 Atherosclerotic heart disease of native coronary artery without angina pectoris: Secondary | ICD-10-CM | POA: Diagnosis not present

## 2016-10-16 DIAGNOSIS — I5022 Chronic systolic (congestive) heart failure: Secondary | ICD-10-CM | POA: Diagnosis not present

## 2016-10-16 DIAGNOSIS — I509 Heart failure, unspecified: Secondary | ICD-10-CM | POA: Diagnosis not present

## 2016-10-16 DIAGNOSIS — I472 Ventricular tachycardia: Secondary | ICD-10-CM | POA: Diagnosis not present

## 2016-10-22 ENCOUNTER — Other Ambulatory Visit: Payer: Self-pay | Admitting: Family Medicine

## 2016-10-25 DIAGNOSIS — I50814 Right heart failure due to left heart failure: Secondary | ICD-10-CM | POA: Diagnosis not present

## 2016-10-25 DIAGNOSIS — I5022 Chronic systolic (congestive) heart failure: Secondary | ICD-10-CM | POA: Diagnosis not present

## 2016-10-25 DIAGNOSIS — I481 Persistent atrial fibrillation: Secondary | ICD-10-CM | POA: Diagnosis not present

## 2016-10-25 DIAGNOSIS — I2510 Atherosclerotic heart disease of native coronary artery without angina pectoris: Secondary | ICD-10-CM | POA: Diagnosis not present

## 2016-10-25 DIAGNOSIS — I472 Ventricular tachycardia: Secondary | ICD-10-CM | POA: Diagnosis not present

## 2016-10-25 DIAGNOSIS — I509 Heart failure, unspecified: Secondary | ICD-10-CM | POA: Diagnosis not present

## 2016-10-29 ENCOUNTER — Other Ambulatory Visit: Payer: Self-pay | Admitting: Family Medicine

## 2016-10-29 NOTE — Telephone Encounter (Signed)
Electronic refill request. Benzonatate Last office visit:   08/12/16 Last Filled:   20 capsule 0 12/06/2015  Please advise.

## 2016-10-30 NOTE — Telephone Encounter (Signed)
Sent. Thanks.   

## 2016-11-01 DIAGNOSIS — I481 Persistent atrial fibrillation: Secondary | ICD-10-CM | POA: Diagnosis not present

## 2016-11-01 DIAGNOSIS — I50814 Right heart failure due to left heart failure: Secondary | ICD-10-CM | POA: Diagnosis not present

## 2016-11-01 DIAGNOSIS — I509 Heart failure, unspecified: Secondary | ICD-10-CM | POA: Diagnosis not present

## 2016-11-01 DIAGNOSIS — I5022 Chronic systolic (congestive) heart failure: Secondary | ICD-10-CM | POA: Diagnosis not present

## 2016-11-01 DIAGNOSIS — I2510 Atherosclerotic heart disease of native coronary artery without angina pectoris: Secondary | ICD-10-CM | POA: Diagnosis not present

## 2016-11-01 DIAGNOSIS — I472 Ventricular tachycardia: Secondary | ICD-10-CM | POA: Diagnosis not present

## 2016-11-04 DIAGNOSIS — I472 Ventricular tachycardia: Secondary | ICD-10-CM | POA: Diagnosis not present

## 2016-11-04 DIAGNOSIS — I509 Heart failure, unspecified: Secondary | ICD-10-CM | POA: Diagnosis not present

## 2016-11-04 DIAGNOSIS — I481 Persistent atrial fibrillation: Secondary | ICD-10-CM | POA: Diagnosis not present

## 2016-11-04 DIAGNOSIS — I2510 Atherosclerotic heart disease of native coronary artery without angina pectoris: Secondary | ICD-10-CM | POA: Diagnosis not present

## 2016-11-04 DIAGNOSIS — I5022 Chronic systolic (congestive) heart failure: Secondary | ICD-10-CM | POA: Diagnosis not present

## 2016-11-04 DIAGNOSIS — I50814 Right heart failure due to left heart failure: Secondary | ICD-10-CM | POA: Diagnosis not present

## 2016-11-08 DIAGNOSIS — I50814 Right heart failure due to left heart failure: Secondary | ICD-10-CM | POA: Diagnosis not present

## 2016-11-08 DIAGNOSIS — I509 Heart failure, unspecified: Secondary | ICD-10-CM | POA: Diagnosis not present

## 2016-11-08 DIAGNOSIS — I2510 Atherosclerotic heart disease of native coronary artery without angina pectoris: Secondary | ICD-10-CM | POA: Diagnosis not present

## 2016-11-08 DIAGNOSIS — I481 Persistent atrial fibrillation: Secondary | ICD-10-CM | POA: Diagnosis not present

## 2016-11-08 DIAGNOSIS — I5022 Chronic systolic (congestive) heart failure: Secondary | ICD-10-CM | POA: Diagnosis not present

## 2016-11-08 DIAGNOSIS — I472 Ventricular tachycardia: Secondary | ICD-10-CM | POA: Diagnosis not present

## 2016-11-09 DIAGNOSIS — I472 Ventricular tachycardia: Secondary | ICD-10-CM | POA: Diagnosis not present

## 2016-11-09 DIAGNOSIS — I5022 Chronic systolic (congestive) heart failure: Secondary | ICD-10-CM | POA: Diagnosis not present

## 2016-11-09 DIAGNOSIS — I50814 Right heart failure due to left heart failure: Secondary | ICD-10-CM | POA: Diagnosis not present

## 2016-11-09 DIAGNOSIS — I481 Persistent atrial fibrillation: Secondary | ICD-10-CM | POA: Diagnosis not present

## 2016-11-09 DIAGNOSIS — I509 Heart failure, unspecified: Secondary | ICD-10-CM | POA: Diagnosis not present

## 2016-11-09 DIAGNOSIS — I2510 Atherosclerotic heart disease of native coronary artery without angina pectoris: Secondary | ICD-10-CM | POA: Diagnosis not present

## 2016-11-15 ENCOUNTER — Ambulatory Visit (INDEPENDENT_AMBULATORY_CARE_PROVIDER_SITE_OTHER)
Admission: RE | Admit: 2016-11-15 | Discharge: 2016-11-15 | Disposition: A | Discharge status: Home / Self Care | Payer: Medicare Other | Source: Ambulatory Visit | Attending: Family Medicine | Admitting: Family Medicine

## 2016-11-15 ENCOUNTER — Encounter: Payer: Self-pay | Admitting: Family Medicine

## 2016-11-15 ENCOUNTER — Ambulatory Visit (INDEPENDENT_AMBULATORY_CARE_PROVIDER_SITE_OTHER): Admitting: Family Medicine

## 2016-11-15 VITALS — BP 130/74 | HR 71 | Temp 97.5°F | Wt 150.2 lb

## 2016-11-15 DIAGNOSIS — I50814 Right heart failure due to left heart failure: Secondary | ICD-10-CM | POA: Diagnosis not present

## 2016-11-15 DIAGNOSIS — I481 Persistent atrial fibrillation: Secondary | ICD-10-CM | POA: Diagnosis not present

## 2016-11-15 DIAGNOSIS — I509 Heart failure, unspecified: Secondary | ICD-10-CM | POA: Diagnosis not present

## 2016-11-15 DIAGNOSIS — R0781 Pleurodynia: Secondary | ICD-10-CM

## 2016-11-15 DIAGNOSIS — I5022 Chronic systolic (congestive) heart failure: Secondary | ICD-10-CM | POA: Diagnosis not present

## 2016-11-15 DIAGNOSIS — I2510 Atherosclerotic heart disease of native coronary artery without angina pectoris: Secondary | ICD-10-CM | POA: Diagnosis not present

## 2016-11-15 DIAGNOSIS — S299XXA Unspecified injury of thorax, initial encounter: Secondary | ICD-10-CM | POA: Diagnosis not present

## 2016-11-15 DIAGNOSIS — I472 Ventricular tachycardia: Secondary | ICD-10-CM | POA: Diagnosis not present

## 2016-11-15 MED ORDER — AMIODARONE HCL 200 MG PO TABS: 200.0000 mg | ORAL_TABLET | Freq: Two times a day (BID) | ORAL | Status: DC

## 2016-11-15 MED ORDER — TRAMADOL HCL 50 MG PO TABS: 50.0000 mg | ORAL_TABLET | Freq: Three times a day (TID) | ORAL | 0 refills | Status: DC | PRN

## 2016-11-15 NOTE — Progress Notes (Signed)
Recheck pulse ox 93%.   He fell 11/10/16.  He was getting up from the couch to go to the BR, lost his balance and went down.  L sided CP in the meantime.  No R sided pain.  L sided is ttp but compression helps with laughing/sneezing.  Still on baseline O2.  More pain as the week has gone on.  No fevers, no chills.  No LOC with the fall.    Fall cautions d/w pt.  Tramadol helps some with the pain but he is running out of med.  No ADE on med.    Meds, vitals, and allergies reviewed.   ROS: Per HPI unless specifically indicated in ROS section   GEN: nad, alert and oriented, on O2 at baseline.  HEENT: mucous membranes moist NECK: supple w/o LA CV: rrr. PULM: ctab, no inc wob, no focal dec in BS but L side of chest wall ttp and sore with deep breath ABD: soft, +bs EXT: no edema SKIN: no acute rash, no bruising.

## 2016-11-15 NOTE — Patient Instructions (Signed)
Try ice in the meantime.  Use tramadol if needed.   Go to the lab on the way out.  We'll contact you with your xray report. We'll go from there.  Take care.  Glad to see you.

## 2016-11-17 DIAGNOSIS — R0789 Other chest pain: Secondary | ICD-10-CM | POA: Insufficient documentation

## 2016-11-17 DIAGNOSIS — R0781 Pleurodynia: Secondary | ICD-10-CM | POA: Insufficient documentation

## 2016-11-17 NOTE — Assessment & Plan Note (Signed)
Fall cautions discussed with patient. Continue tramadol as needed. No rib fx seen on cxr. Update [place] if not better in a few more days or if worse.  Okay for outpatient f/u.

## 2016-11-20 DIAGNOSIS — I2510 Atherosclerotic heart disease of native coronary artery without angina pectoris: Secondary | ICD-10-CM | POA: Diagnosis not present

## 2016-11-20 DIAGNOSIS — I481 Persistent atrial fibrillation: Secondary | ICD-10-CM | POA: Diagnosis not present

## 2016-11-20 DIAGNOSIS — I472 Ventricular tachycardia: Secondary | ICD-10-CM | POA: Diagnosis not present

## 2016-11-20 DIAGNOSIS — I5022 Chronic systolic (congestive) heart failure: Secondary | ICD-10-CM | POA: Diagnosis not present

## 2016-11-20 DIAGNOSIS — I50814 Right heart failure due to left heart failure: Secondary | ICD-10-CM | POA: Diagnosis not present

## 2016-11-20 DIAGNOSIS — I509 Heart failure, unspecified: Secondary | ICD-10-CM | POA: Diagnosis not present

## 2016-11-21 DIAGNOSIS — I472 Ventricular tachycardia: Secondary | ICD-10-CM | POA: Diagnosis not present

## 2016-11-21 DIAGNOSIS — I5022 Chronic systolic (congestive) heart failure: Secondary | ICD-10-CM | POA: Diagnosis not present

## 2016-11-21 DIAGNOSIS — I2510 Atherosclerotic heart disease of native coronary artery without angina pectoris: Secondary | ICD-10-CM | POA: Diagnosis not present

## 2016-11-21 DIAGNOSIS — I509 Heart failure, unspecified: Secondary | ICD-10-CM | POA: Diagnosis not present

## 2016-11-21 DIAGNOSIS — I481 Persistent atrial fibrillation: Secondary | ICD-10-CM | POA: Diagnosis not present

## 2016-11-21 DIAGNOSIS — I50814 Right heart failure due to left heart failure: Secondary | ICD-10-CM | POA: Diagnosis not present

## 2016-11-22 DIAGNOSIS — I50814 Right heart failure due to left heart failure: Secondary | ICD-10-CM | POA: Diagnosis not present

## 2016-11-22 DIAGNOSIS — I472 Ventricular tachycardia: Secondary | ICD-10-CM | POA: Diagnosis not present

## 2016-11-22 DIAGNOSIS — I2510 Atherosclerotic heart disease of native coronary artery without angina pectoris: Secondary | ICD-10-CM | POA: Diagnosis not present

## 2016-11-22 DIAGNOSIS — I481 Persistent atrial fibrillation: Secondary | ICD-10-CM | POA: Diagnosis not present

## 2016-11-22 DIAGNOSIS — I509 Heart failure, unspecified: Secondary | ICD-10-CM | POA: Diagnosis not present

## 2016-11-22 DIAGNOSIS — I5022 Chronic systolic (congestive) heart failure: Secondary | ICD-10-CM | POA: Diagnosis not present

## 2016-11-27 ENCOUNTER — Encounter: Payer: Self-pay | Admitting: Internal Medicine

## 2016-11-27 ENCOUNTER — Ambulatory Visit (INDEPENDENT_AMBULATORY_CARE_PROVIDER_SITE_OTHER): Payer: Medicare Other | Admitting: Internal Medicine

## 2016-11-27 VITALS — BP 102/68 | HR 56 | Ht 63.0 in | Wt 152.0 lb

## 2016-11-27 DIAGNOSIS — I5043 Acute on chronic combined systolic (congestive) and diastolic (congestive) heart failure: Secondary | ICD-10-CM | POA: Diagnosis not present

## 2016-11-27 DIAGNOSIS — I472 Ventricular tachycardia, unspecified: Secondary | ICD-10-CM

## 2016-11-27 NOTE — Patient Instructions (Addendum)
Medication Instructions:  Your physician recommends that you continue on your current medications as directed. Please refer to the Current Medication list given to you today.  Labwork: None ordered.  Testing/Procedures: None ordered.  Follow-Up: Your physician wants you to follow-up in: 3 months with Dr. [name].     Remote monitoring is used to monitor your ICD from home. This monitoring reduces the number of office visits required to check your device to one time per year. It allows [place] to keep an eye on the functioning of your device to ensure it is working properly. You are scheduled for a device check from home on 12/25/2016. You may send your transmission at any time that day. If you have a wireless device, the transmission will be sent automatically. After your physician reviews your transmission, you will receive a postcard with your next transmission date.     Any Other Special Instructions Will Be Listed Below (If Applicable).   If you need a refill on your cardiac medications before your next appointment, please call your pharmacy.

## 2016-11-27 NOTE — Progress Notes (Signed)
HPI [name] returns today for ongoing evaluation and management of his VT in the setting of an endstage CM. The patient was last seen by me 2 months ago. We discussed deactivating his ICD but he was not certain of this. Since I saw him last he has had no ICD shocks. He has improved. No CHF symptoms to speak of although he admits to being sedentary. No edema. Allergies  Allergen Reactions  . Ace Inhibitors Other (See Comments)    muscle pain. Tolerates ARBs.   . Codeine Other (See Comments)    "head wants to explode."  . Doxycycline Diarrhea and Nausea And Vomiting  . Kionex [Sodium Polystyrene Sulfonate] Other (See Comments)    SOB, pressure in chest, weakness fatigue.   [name] Penicillins Swelling    "started at point of injection; w/in 3 min my upper arm was swollen 3 times normal" Has patient had a PCN reaction causing immediate rash, facial/tongue/throat swelling, SOB or lightheadedness with hypotension: Yes Has patient had a PCN reaction causing severe rash involving mucus membranes or skin necrosis: No Has patient had a PCN reaction that required hospitalization No Has patient had a PCN reaction occurring within the last 10 years: No If all of the above answers are "NO", then may proceed wi  . Lisinopril Other (See Comments)    Muscle Pain  . Statins Other (See Comments)    Myalgias per patient  . Hydrocodone Other (See Comments)    Severe headache, esp when combined with skelaxin.    . Lidocaine     Hallucinations, jerking  . Pacerone [Amiodarone] Other (See Comments)    Lung and heart problem (09/16/16 - pt is taking 200 mg daily with no reaction - states reaction was to a higher dose)  . Xanax [Alprazolam] Other (See Comments)    Nightmares.       Current Outpatient Prescriptions  Medication Sig Dispense Refill  . acetaminophen (TYLENOL) 325 MG tablet Take 2 tablets (650 mg total) by mouth every 6 (six) hours as needed for mild pain (or Fever >/= 101).    [name]  albuterol (PROAIR HFA) 108 (90 Base) MCG/ACT inhaler INHALE 1-2 PUFFS INTO THE LUNGS EVERY 6 (SIX) HOURS AS NEEDED FOR WHEEZING OR SHORTNESS OF BREATH. 8.5 Inhaler 0  . amiodarone (PACERONE) 200 MG tablet Take 1 tablet (200 mg total) by mouth 2 (two) times daily.    [name] apixaban (ELIQUIS) 2.5 MG TABS tablet Take 1 tablet (2.5 mg total) by mouth 2 (two) times daily. 180 tablet 3  . benzonatate (TESSALON) 200 MG capsule TAKE 1 CAPSULE BY MOUTH 3 TIMES DAILY AS NEEDED FOR COUGH 30 capsule 1  . budesonide-formoterol (SYMBICORT) 160-4.5 MCG/ACT inhaler Inhale 2 puffs into the lungs 2 (two) times daily. 1 Inhaler 6  . Chlorpheniramine-APAP (CORICIDIN) 2-325 MG TABS Take 2 tablets by mouth daily as needed (congestion/runny nose).    . ferrous sulfate (FERROUSUL) 325 (65 FE) MG tablet Take 1 tablet (325 mg total) by mouth daily with breakfast.    . furosemide (LASIX) 40 MG tablet Take 1 tablet (40 mg total) by mouth daily. 90 tablet 3  . Guaifenesin (MUCINEX MAXIMUM STRENGTH) 1200 MG TB12 Take 1,200 mg by mouth 2 (two) times daily.    [name] levalbuterol (XOPENEX) 0.63 MG/3ML nebulizer solution Take 0.63 mg by nebulization every 4 (four) hours as needed for wheezing or shortness of breath.    . loperamide (IMODIUM) 2 MG capsule Take 1 capsule (2 mg  total) by mouth See admin instructions. Take 1 capsule (2 mg) by mouth after each bowel movement    . metaxalone (SKELAXIN) 800 MG tablet Take 0.5 tablets (400 mg total) by mouth 3 (three) times daily as needed for muscle spasms.    [name]. mexiletine (MEXITIL) 150 MG capsule Take 2 capsules (300 mg total) by mouth every 8 (eight) hours. 8am, 3:30pm, 11:30pm 180 capsule 11  . nitroGLYCERIN (NITROSTAT) 0.4 MG SL tablet Place 1 tablet (0.4 mg total) under the tongue every 5 (five) minutes as needed for chest pain.    . OXYGEN Inhale 2 L into the lungs continuous.    . promethazine (PHENERGAN) 25 MG tablet Take 25 mg by mouth every 6 (six) hours as needed for nausea or vomiting.     [name]. RANEXA 500 MG 12 hr tablet TAKE 1 TABLET (500 MG TOTAL) BY MOUTH 2 (TWO) TIMES DAILY.  3  . ranitidine (ZANTAC) 150 MG capsule Take 150 mg by mouth daily as needed for heartburn.     . Simethicone (GAS-X PO) Take 1-2 capsules by mouth daily as needed (pressure/bloating/gas). As needed.     . traMADol (ULTRAM) 50 MG tablet Take 1 tablet (50 mg total) by mouth every 8 (eight) hours as needed (for pain). 100 tablet 0  . zolpidem (AMBIEN) 10 MG tablet TAKE 0.5-1 TABLETS (5-10 MG TOTAL) BY MOUTH AT BEDTIME AS NEEDED FOR SLEEP 30 tablet 5   No current facility-administered medications for this visit.    Facility-Administered Medications Ordered in Other Visits  Medication Dose Route Frequency Provider Last Rate Last Dose  . sodium chloride 0.9 % injection 3 mL  3 mL Intravenous Q12H Marinus Mawaylor, Gregg W, MD      . sodium chloride 0.9 % injection 3 mL  3 mL Intravenous PRN Marinus Mawaylor, Gregg W, MD         Past Medical History:  Diagnosis Date  . AICD (automatic cardioverter/defibrillator) present   . Allergic rhinitis, cause unspecified   . Anxiety   . Arthritis    "all over" (11/07/2015)  . Atrial fibrillation (HCC)    a. 05/2015 - converted to sinus in setting of ICD shocks; placed on eliquis 5 bid.  [name]. CAD (coronary artery disease)   . Cervical herniated disc    told not to lift >10 lbs  . Chronic systolic CHF (congestive heart failure), NYHA class 2 (HCC)    Reports EF of 25%.   [name]. COPD (chronic obstructive pulmonary disease) (HCC) 11/2012   by xray  . Diverticulosis    by CT scan  . HCAP (healthcare-associated pneumonia) 11/06/2015  . History of lower GI bleeding 12/11/2011   "first time" (12/11/2011)  . History of MI (myocardial infarction) 1995   [place], [place] stent, ?PTCA  . Hypertension   . Insomnia   . Ischemic cardiomyopathy    Echo 8/18: EF 20-25, diffuse HK, grade 2 DD, trivial AI, severe central MR, severe LAE, moderately reduced RVSF, moderate RAE, PASP 63  .  Perennial allergic rhinitis    only to dust mites  . Pneumonia 2000s   "walking pneumonia"  . VT (ventricular tachycardia) (HCC)    a. 05/2015 - VT storm with multiple ICD shocks-->Amio 400 BID.    ROS:   All systems reviewed and negative except as noted in the HPI.   Past Surgical History:  Procedure Laterality Date  . CARDIAC CATHETERIZATION  2004   LAD 30%, D1 30%, CFX-AV groove 70-80%, OM1 30%,  EF 20-25%  . CARDIAC CATHETERIZATION N/A 09/28/2015   Procedure: Left Heart Cath and Coronary Angiography;  Surgeon: Peter M Swaziland, MD;  Location: [facility] INVASIVE CV LAB;  Service: Cardiovascular;  Laterality: N/A;  . CARDIAC DEFIBRILLATOR PLACEMENT  2004  . CATARACT EXTRACTION W/ INTRAOCULAR LENS IMPLANT Right 01/2012  . COLONOSCOPY  01/08/2012   Procedure: COLONOSCOPY;  Surgeon: Iva Boop, MD;  Location: [facility] ENDOSCOPY;  Service: Endoscopy;  Laterality: N/A;  . CORONARY ANGIOPLASTY  1995   Pt thinks he got a balloon, living in [place], [place]  . IMPLANTABLE CARDIOVERTER DEFIBRILLATOR GENERATOR CHANGE N/A 02/07/2012   Procedure: IMPLANTABLE CARDIOVERTER DEFIBRILLATOR GENERATOR CHANGE;  Surgeon: Marinus Maw, MD; Medtronic Evera XT VR single-chamber serial number [id number], Laterality: Left  . INSERT / REPLACE / REMOVE PACEMAKER  2004   Medtronic ICD  . KNEE ARTHROSCOPY Left 05/2003   Hattie Perch 06/19/2010  . LAPAROSCOPIC CHOLECYSTECTOMY  1/ 2012  . SHOULDER ARTHROSCOPY W/ ROTATOR CUFF REPAIR Right twice  . TONSILLECTOMY AND ADENOIDECTOMY  ~ 1951  . V TACH ABLATION N/A 04/29/2016   Procedure: V Tach Ablation;  Surgeon: Marinus Maw, MD;  Location: [facility] INVASIVE CV LAB;  Service: Cardiovascular;  Laterality: N/A;     Family History  Problem Relation Age of Onset  . Diabetes Father   . Tracheal cancer Father 63       smoker  . Stroke Mother   . Cancer Sister        left eye  . CAD Neg Hx   . Colon cancer Neg Hx   . Prostate cancer Neg Hx      Social History   Social History  .  Marital status: Married    Spouse name: N/A  . Number of children: 0  . Years of education: N/A   Occupational History  . [employer] truck driver (retired)    Social History Main Topics  . Smoking status: Former Smoker    Packs/day: 0.50    Years: 50.00    Types: Cigarettes, Cigars    Quit date: 09/27/2015  . Smokeless tobacco: Never Used  . Alcohol use No  . Drug use: No  . Sexual activity: Not Currently   Other Topics Concern  . Not on file   Social History Narrative   Lives with wife, married 1998   Grown children, 2 great grandchildren   Occupation: retired, was Presenter, broadcasting   Activity: walking, fishing   Diet: good water daily, fruits/vegetables rare      Wife is Product manager.    6213-08, Human resources officer. No known agent orange exposure.       BP 102/68   Pulse (!) 56   Ht 5\' 3"  (1.6 m)   Wt 152 lb (68.9 kg)   BMI 26.93 kg/m   Physical Exam:  stable appearing NAD HEENT: Unremarkable Neck:  6 cm JVD, no thyromegally Lymphatics:  No adenopathy Back:  No CVA tenderness Lungs:  Clear with no wheezes HEART:  Regular rate rhythm, no murmurs, no rubs, no clicks Abd:  soft, positive bowel sounds, no organomegally, no rebound, no guarding Ext:  2 plus pulses, no edema, no cyanosis, no clubbing Skin:  No rashes no nodules Neuro:  CN II through XII intact, motor grossly intact  DEVICE  Normal device function.  See PaceArt for details.   Assess/Plan: 1. VT - he has had no recurrent symptoms. Will follow. 2. Chronic systolic heart failure - his symptoms are class 3. He appears to  be euvolemic. His fluid index looks good. 3. ICD - his medtronic device is working normally. Will follow.  Leonia Reeves.D.

## 2016-11-29 DIAGNOSIS — I509 Heart failure, unspecified: Secondary | ICD-10-CM | POA: Diagnosis not present

## 2016-11-29 DIAGNOSIS — I5022 Chronic systolic (congestive) heart failure: Secondary | ICD-10-CM | POA: Diagnosis not present

## 2016-11-29 DIAGNOSIS — I50814 Right heart failure due to left heart failure: Secondary | ICD-10-CM | POA: Diagnosis not present

## 2016-11-29 DIAGNOSIS — I472 Ventricular tachycardia: Secondary | ICD-10-CM | POA: Diagnosis not present

## 2016-11-29 DIAGNOSIS — I481 Persistent atrial fibrillation: Secondary | ICD-10-CM | POA: Diagnosis not present

## 2016-11-29 DIAGNOSIS — I2510 Atherosclerotic heart disease of native coronary artery without angina pectoris: Secondary | ICD-10-CM | POA: Diagnosis not present

## 2016-11-29 NOTE — Telephone Encounter (Signed)
Pt with office visit 11/27/2016.  Pt offered DNR by Dr. [name].  Pt refused.  Pt does not want therapies turned off at this time.

## 2016-12-01 DIAGNOSIS — I472 Ventricular tachycardia: Secondary | ICD-10-CM | POA: Diagnosis not present

## 2016-12-01 DIAGNOSIS — I5022 Chronic systolic (congestive) heart failure: Secondary | ICD-10-CM | POA: Diagnosis not present

## 2016-12-01 DIAGNOSIS — I509 Heart failure, unspecified: Secondary | ICD-10-CM | POA: Diagnosis not present

## 2016-12-01 DIAGNOSIS — I481 Persistent atrial fibrillation: Secondary | ICD-10-CM | POA: Diagnosis not present

## 2016-12-01 DIAGNOSIS — I2510 Atherosclerotic heart disease of native coronary artery without angina pectoris: Secondary | ICD-10-CM | POA: Diagnosis not present

## 2016-12-01 DIAGNOSIS — I50814 Right heart failure due to left heart failure: Secondary | ICD-10-CM | POA: Diagnosis not present

## 2016-12-02 DIAGNOSIS — I481 Persistent atrial fibrillation: Secondary | ICD-10-CM | POA: Diagnosis not present

## 2016-12-02 DIAGNOSIS — I509 Heart failure, unspecified: Secondary | ICD-10-CM | POA: Diagnosis not present

## 2016-12-02 DIAGNOSIS — I50814 Right heart failure due to left heart failure: Secondary | ICD-10-CM | POA: Diagnosis not present

## 2016-12-02 DIAGNOSIS — I472 Ventricular tachycardia: Secondary | ICD-10-CM | POA: Diagnosis not present

## 2016-12-02 DIAGNOSIS — I2510 Atherosclerotic heart disease of native coronary artery without angina pectoris: Secondary | ICD-10-CM | POA: Diagnosis not present

## 2016-12-02 DIAGNOSIS — I5022 Chronic systolic (congestive) heart failure: Secondary | ICD-10-CM | POA: Diagnosis not present

## 2016-12-04 DIAGNOSIS — I472 Ventricular tachycardia: Secondary | ICD-10-CM | POA: Diagnosis not present

## 2016-12-04 DIAGNOSIS — I481 Persistent atrial fibrillation: Secondary | ICD-10-CM | POA: Diagnosis not present

## 2016-12-04 DIAGNOSIS — I5022 Chronic systolic (congestive) heart failure: Secondary | ICD-10-CM | POA: Diagnosis not present

## 2016-12-04 DIAGNOSIS — I2510 Atherosclerotic heart disease of native coronary artery without angina pectoris: Secondary | ICD-10-CM | POA: Diagnosis not present

## 2016-12-04 DIAGNOSIS — I50814 Right heart failure due to left heart failure: Secondary | ICD-10-CM | POA: Diagnosis not present

## 2016-12-04 DIAGNOSIS — I509 Heart failure, unspecified: Secondary | ICD-10-CM | POA: Diagnosis not present

## 2016-12-05 DIAGNOSIS — I50814 Right heart failure due to left heart failure: Secondary | ICD-10-CM | POA: Diagnosis not present

## 2016-12-05 DIAGNOSIS — I472 Ventricular tachycardia: Secondary | ICD-10-CM | POA: Diagnosis not present

## 2016-12-05 DIAGNOSIS — I481 Persistent atrial fibrillation: Secondary | ICD-10-CM | POA: Diagnosis not present

## 2016-12-05 DIAGNOSIS — I509 Heart failure, unspecified: Secondary | ICD-10-CM | POA: Diagnosis not present

## 2016-12-05 DIAGNOSIS — I5022 Chronic systolic (congestive) heart failure: Secondary | ICD-10-CM | POA: Diagnosis not present

## 2016-12-05 DIAGNOSIS — I2510 Atherosclerotic heart disease of native coronary artery without angina pectoris: Secondary | ICD-10-CM | POA: Diagnosis not present

## 2016-12-06 DIAGNOSIS — I5022 Chronic systolic (congestive) heart failure: Secondary | ICD-10-CM | POA: Diagnosis not present

## 2016-12-06 DIAGNOSIS — I481 Persistent atrial fibrillation: Secondary | ICD-10-CM | POA: Diagnosis not present

## 2016-12-06 DIAGNOSIS — I509 Heart failure, unspecified: Secondary | ICD-10-CM | POA: Diagnosis not present

## 2016-12-06 DIAGNOSIS — I50814 Right heart failure due to left heart failure: Secondary | ICD-10-CM | POA: Diagnosis not present

## 2016-12-06 DIAGNOSIS — I2510 Atherosclerotic heart disease of native coronary artery without angina pectoris: Secondary | ICD-10-CM | POA: Diagnosis not present

## 2016-12-06 DIAGNOSIS — I472 Ventricular tachycardia: Secondary | ICD-10-CM | POA: Diagnosis not present

## 2016-12-09 ENCOUNTER — Ambulatory Visit: Payer: Federal, State, Local not specified - PPO | Admitting: Family Medicine

## 2016-12-09 DIAGNOSIS — Z0289 Encounter for other administrative examinations: Secondary | ICD-10-CM

## 2016-12-09 NOTE — Progress Notes (Signed)
Received fax from [facility], Pt has been started on Imdur 30 mg ER one tab daily by Dr. Nadyne Coombes.  Will add to Pt med list. No further action needed.

## 2016-12-10 ENCOUNTER — Ambulatory Visit (INDEPENDENT_AMBULATORY_CARE_PROVIDER_SITE_OTHER): Payer: Medicare Other | Admitting: Family Medicine

## 2016-12-10 ENCOUNTER — Encounter: Payer: Self-pay | Admitting: Family Medicine

## 2016-12-10 VITALS — BP 130/56 | HR 59 | Temp 98.0°F | Ht 63.0 in | Wt 151.4 lb

## 2016-12-10 DIAGNOSIS — I472 Ventricular tachycardia, unspecified: Secondary | ICD-10-CM

## 2016-12-10 DIAGNOSIS — Z9981 Dependence on supplemental oxygen: Secondary | ICD-10-CM | POA: Diagnosis not present

## 2016-12-10 DIAGNOSIS — J069 Acute upper respiratory infection, unspecified: Secondary | ICD-10-CM | POA: Insufficient documentation

## 2016-12-10 DIAGNOSIS — I509 Heart failure, unspecified: Secondary | ICD-10-CM | POA: Diagnosis not present

## 2016-12-10 DIAGNOSIS — I5022 Chronic systolic (congestive) heart failure: Secondary | ICD-10-CM | POA: Diagnosis not present

## 2016-12-10 DIAGNOSIS — J432 Centrilobular emphysema: Secondary | ICD-10-CM | POA: Diagnosis not present

## 2016-12-10 DIAGNOSIS — I50814 Right heart failure due to left heart failure: Secondary | ICD-10-CM | POA: Diagnosis not present

## 2016-12-10 DIAGNOSIS — I2510 Atherosclerotic heart disease of native coronary artery without angina pectoris: Secondary | ICD-10-CM | POA: Diagnosis not present

## 2016-12-10 DIAGNOSIS — I481 Persistent atrial fibrillation: Secondary | ICD-10-CM | POA: Diagnosis not present

## 2016-12-10 DIAGNOSIS — Z23 Encounter for immunization: Secondary | ICD-10-CM

## 2016-12-10 LAB — CUP PACEART INCLINIC DEVICE CHECK
Battery Remaining Longevity: 25 mo
Battery Voltage: 2.96 V
Brady Statistic RV Percent Paced: 0.16 %
Date Time Interrogation Session: 20181024182217
HIGH POWER IMPEDANCE MEASURED VALUE: 55 Ohm
HighPow Impedance: 49 Ohm
Implantable Lead Location: 753860
Implantable Lead Model: 6947
Lead Channel Impedance Value: 342 Ohm
Lead Channel Impedance Value: 399 Ohm
Lead Channel Sensing Intrinsic Amplitude: 3.375 mV
Lead Channel Setting Pacing Amplitude: 2.5 V
MDC IDC LEAD IMPLANT DT: 20040722
MDC IDC MSMT LEADCHNL RV PACING THRESHOLD AMPLITUDE: 0.75 V
MDC IDC MSMT LEADCHNL RV PACING THRESHOLD PULSEWIDTH: 0.4 ms
MDC IDC MSMT LEADCHNL RV SENSING INTR AMPL: 4.375 mV
MDC IDC PG IMPLANT DT: 20140103
MDC IDC SET LEADCHNL RV PACING PULSEWIDTH: 0.4 ms
MDC IDC SET LEADCHNL RV SENSING SENSITIVITY: 0.3 mV

## 2016-12-10 MED ORDER — DOXYCYCLINE HYCLATE 100 MG PO TABS: 100.0000 mg | ORAL_TABLET | Freq: Two times a day (BID) | ORAL | 0 refills | Status: DC

## 2016-12-10 MED ORDER — BENZONATATE 200 MG PO CAPS: ORAL_CAPSULE | ORAL | 1 refills | Status: AC

## 2016-12-10 NOTE — Assessment & Plan Note (Signed)
Given duration, progression of symptoms and his history, will treat with abx.  Discussed with pt and his wife- limited abx choices based on allergies and concurrent rxs, such as amiodarone. He did have some diarrhea with doxycyline but is willing to try this again. eRx sent along with tessalon to take as needed. Call or return to clinic prn if these symptoms worsen or fail to improve as anticipated. The patient indicates understanding of these issues and agrees with the plan.

## 2016-12-10 NOTE — Addendum Note (Signed)
Addended by: Lerry Liner on: 12/10/2016 01:33 PM   Modules accepted: Orders

## 2016-12-10 NOTE — Progress Notes (Signed)
Subjective:   Patient ID: [name], male    DOB: 11/12/1945, 71 y.o.   MRN: [id number]  [name] is a pleasant 71 y.o. year old male who presents to clinic today with Cough (Patient is here today C/O a cough that is nonproductive x4-5d.  Also has periorbital sinus pressure.  Is on 2L of O2.)  on 12/10/2016  HPI:  Patient of Dr. Para March, new to me. Complicated medical history.  He is on home hospice.  Does have a h/o pulmonary fibrosis and emphysema, followed by pulmonary- Dr. Isaiah Serge. Was last seen on 10/03/16- note reviewed.  ?pulmonary fibrosis is due to amiodarone toxicity.  Has been admitted for HCAP several times.  Multiple drug allergies.  Today he is here for a nonproductive cough and sinus pressure for 4- 5 days.  No fevers.  He does have a baseline O2 requirement of 2 L.  Current Outpatient Medications on File Prior to Visit  Medication Sig Dispense Refill  . acetaminophen (TYLENOL) 325 MG tablet Take 2 tablets (650 mg total) by mouth every 6 (six) hours as needed for mild pain (or Fever >/= 101).    [name] albuterol (PROAIR HFA) 108 (90 Base) MCG/ACT inhaler INHALE 1-2 PUFFS INTO THE LUNGS EVERY 6 (SIX) HOURS AS NEEDED FOR WHEEZING OR SHORTNESS OF BREATH. 8.5 Inhaler 0  . amiodarone (PACERONE) 200 MG tablet Take 1 tablet (200 mg total) by mouth 2 (two) times daily.    [name] apixaban (ELIQUIS) 2.5 MG TABS tablet Take 1 tablet (2.5 mg total) by mouth 2 (two) times daily. 180 tablet 3  . budesonide-formoterol (SYMBICORT) 160-4.5 MCG/ACT inhaler Inhale 2 puffs into the lungs 2 (two) times daily. 1 Inhaler 6  . Chlorpheniramine-APAP (CORICIDIN) 2-325 MG TABS Take 2 tablets by mouth daily as needed (congestion/runny nose).    . ferrous sulfate (FERROUSUL) 325 (65 FE) MG tablet Take 1 tablet (325 mg total) by mouth daily with breakfast.    . furosemide (LASIX) 40 MG tablet Take 1 tablet (40 mg total) by mouth daily. 90 tablet 3  . Guaifenesin (MUCINEX MAXIMUM STRENGTH) 1200  MG TB12 Take 1,200 mg by mouth 2 (two) times daily.    . isosorbide mononitrate (IMDUR) 30 MG 24 hr tablet Take 30 mg daily by mouth.    . levalbuterol (XOPENEX) 0.63 MG/3ML nebulizer solution Take 0.63 mg by nebulization every 4 (four) hours as needed for wheezing or shortness of breath.    . loperamide (IMODIUM) 2 MG capsule Take 1 capsule (2 mg total) by mouth See admin instructions. Take 1 capsule (2 mg) by mouth after each bowel movement    . metaxalone (SKELAXIN) 800 MG tablet Take 0.5 tablets (400 mg total) by mouth 3 (three) times daily as needed for muscle spasms.    [name] mexiletine (MEXITIL) 150 MG capsule Take 2 capsules (300 mg total) by mouth every 8 (eight) hours. 8am, 3:30pm, 11:30pm 180 capsule 11  . nitroGLYCERIN (NITROSTAT) 0.4 MG SL tablet Place 1 tablet (0.4 mg total) under the tongue every 5 (five) minutes as needed for chest pain.    . OXYGEN Inhale 2 L into the lungs continuous.    . promethazine (PHENERGAN) 25 MG tablet Take 25 mg by mouth every 6 (six) hours as needed for nausea or vomiting.    [name] RANEXA 500 MG 12 hr tablet TAKE 1 TABLET (500 MG TOTAL) BY MOUTH 2 (TWO) TIMES DAILY.  3  . ranitidine (ZANTAC) 150 MG capsule Take 150 mg  by mouth daily as needed for heartburn.     . Simethicone (GAS-X PO) Take 1-2 capsules by mouth daily as needed (pressure/bloating/gas). As needed.     . traMADol (ULTRAM) 50 MG tablet Take 1 tablet (50 mg total) by mouth every 8 (eight) hours as needed (for pain). 100 tablet 0  . zolpidem (AMBIEN) 10 MG tablet TAKE 0.5-1 TABLETS (5-10 MG TOTAL) BY MOUTH AT BEDTIME AS NEEDED FOR SLEEP 30 tablet 5   No current facility-administered medications on file prior to visit.     Allergies  Allergen Reactions  . Ace Inhibitors Other (See Comments)    muscle pain. Tolerates ARBs.   . Codeine Other (See Comments)    "head wants to explode."  . Doxycycline Diarrhea and Nausea And Vomiting  . Kionex [Sodium Polystyrene Sulfonate] Other (See Comments)     SOB, pressure in chest, weakness fatigue.   [name]. Penicillins Swelling    "started at point of injection; w/in 3 [name] my upper arm was swollen 3 times normal" Has patient had a PCN reaction causing immediate rash, facial/tongue/throat swelling, SOB or lightheadedness with hypotension: Yes Has patient had a PCN reaction causing severe rash involving mucus membranes or skin necrosis: No Has patient had a PCN reaction that required hospitalization No Has patient had a PCN reaction occurring within the last 10 years: No If all of the above answers are "NO", then may proceed wi  . Lisinopril Other (See Comments)    Muscle Pain  . Statins Other (See Comments)    Myalgias per patient  . Hydrocodone Other (See Comments)    Severe headache, esp when combined with skelaxin.    . Lidocaine     Hallucinations, jerking  . Pacerone [Amiodarone] Other (See Comments)    Lung and heart problem (09/16/16 - pt is taking 200 mg daily with no reaction - states reaction was to a higher dose)  . Xanax [Alprazolam] Other (See Comments)    Nightmares.      Past Medical History:  Diagnosis Date  . AICD (automatic cardioverter/defibrillator) present   . Allergic rhinitis, cause unspecified   . Anxiety   . Arthritis    "all over" (11/07/2015)  . Atrial fibrillation (HCC)    a. 05/2015 - converted to sinus in setting of ICD shocks; placed on eliquis 5 bid.  [name]. CAD (coronary artery disease)   . Cervical herniated disc    told not to lift >10 lbs  . Chronic systolic CHF (congestive heart failure), NYHA class 2 (HCC)    Reports EF of 25%.   [name]. COPD (chronic obstructive pulmonary disease) (HCC) 11/2012   by xray  . Diverticulosis    by CT scan  . HCAP (healthcare-associated pneumonia) 11/06/2015  . History of lower GI bleeding 12/11/2011   "first time" (12/11/2011)  . History of MI (myocardial infarction) 1995   [place] living in [place], [place] stent, ?PTCA  . Hypertension   . Insomnia   . Ischemic cardiomyopathy     Echo 8/18: EF 20-25, diffuse HK, grade 2 DD, trivial AI, severe central MR, severe LAE, moderately reduced RVSF, moderate RAE, PASP 63  . Perennial allergic rhinitis    only to dust mites  . Pneumonia 2000s   "walking pneumonia"  . VT (ventricular tachycardia) (HCC)    a. 05/2015 - VT storm with multiple ICD shocks-->Amio 400 BID.    Past Surgical History:  Procedure Laterality Date  . CARDIAC CATHETERIZATION  2004   LAD 30%, D1  30%, CFX-AV groove 70-80%, OM1 30%, EF 20-25%  . CARDIAC DEFIBRILLATOR PLACEMENT  2004  . CATARACT EXTRACTION W/ INTRAOCULAR LENS IMPLANT Right 01/2012  . CORONARY ANGIOPLASTY  1995   Pt thinks he got a balloon, living in [place], [place]  . INSERT / REPLACE / REMOVE PACEMAKER  2004   Medtronic ICD  . KNEE ARTHROSCOPY Left 05/2003   Hattie Perch 06/19/2010  . LAPAROSCOPIC CHOLECYSTECTOMY  1/ 2012  . SHOULDER ARTHROSCOPY W/ ROTATOR CUFF REPAIR Right twice  . TONSILLECTOMY AND ADENOIDECTOMY  ~ 1951    Family History  Problem Relation Age of Onset  . Diabetes Father   . Tracheal cancer Father 59       smoker  . Stroke Mother   . Cancer Sister        left eye  . CAD Neg Hx   . Colon cancer Neg Hx   . Prostate cancer Neg Hx     Social History   Socioeconomic History  . Marital status: Married    Spouse name: Not on file  . Number of children: 0  . Years of education: Not on file  . Highest education level: Not on file  Social Needs  . Financial resource strain: Not on file  . Food insecurity - worry: Not on file  . Food insecurity - inability: Not on file  . Transportation needs - medical: Not on file  . Transportation needs - non-medical: Not on file  Occupational History  . 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 Smoking status: Former Smoker    Packs/day: 0.50    Years: 50.00    Pack years: 25.00    Types: Cigarettes, Cigars    Last attempt to quit: 09/27/2015    Years since quitting: 1.2  . Smokeless tobacco: Never Used    Substance and Sexual Activity  . Alcohol use: No    Alcohol/week: 0.0 oz  . Drug use: No  . Sexual activity: Not Currently  Other Topics Concern  . Not on file  Social History Narrative   Lives with wife, married 1998   Grown children, 2 great grandchildren   Occupation: retired, was Presenter, broadcasting   Activity: walking, fishing   Diet: good water daily, fruits/vegetables rare      Wife is Product manager.    [id number], Human resources officer. No known agent orange exposure.     The PMH, PSH, Social History, Family History, Medications, and allergies have been reviewed in [facility] and have been updated if relevant.   Review of Systems  Constitutional: Negative.   HENT: Positive for sinus pressure and sinus pain. Negative for tinnitus, trouble swallowing and voice change.   Respiratory: Positive for cough and shortness of breath. Negative for wheezing and stridor.   Cardiovascular: Negative.   Gastrointestinal: Negative.   Hematological: Negative.   Psychiatric/Behavioral: Negative.   All other systems reviewed and are negative.      Objective:    BP (!) 130/56 (BP Location: Left Arm, Patient Position: Sitting, Cuff Size: Normal)   Pulse (!) 59   Temp 98 F (36.7 C) (Oral)   Ht 5\' 3"  (1.6 m)   Wt 151 lb 6.4 oz (68.7 kg)   SpO2 90% Comment: 2L of O2  BMI 26.82 kg/m    Physical Exam  Constitutional: He is oriented to person, place, and time. He appears well-nourished. No distress.  On 2 L O2 per [place]  HENT:  Head: Normocephalic and atraumatic.  Right Ear:  Hearing normal.  Left Ear: Hearing and tympanic membrane normal.  Nose: Rhinorrhea present. Right sinus exhibits frontal sinus tenderness. Left sinus exhibits frontal sinus tenderness.  Pulmonary/Chest: He has no wheezes.  Neurological: He is alert and oriented to person, place, and time. No cranial nerve deficit.  Skin: Skin is warm and dry. He is not diaphoretic.  Psychiatric: He has a normal mood and affect. His behavior is normal.   Nursing note and vitals reviewed.         Assessment & Plan:   Centrilobular emphysema (HCC)  VT (ventricular tachycardia) (HCC)  Supplemental oxygen dependent  Upper respiratory tract infection, unspecified type No Follow-up on file.

## 2016-12-13 DIAGNOSIS — I472 Ventricular tachycardia: Secondary | ICD-10-CM | POA: Diagnosis not present

## 2016-12-13 DIAGNOSIS — I2510 Atherosclerotic heart disease of native coronary artery without angina pectoris: Secondary | ICD-10-CM | POA: Diagnosis not present

## 2016-12-13 DIAGNOSIS — I481 Persistent atrial fibrillation: Secondary | ICD-10-CM | POA: Diagnosis not present

## 2016-12-13 DIAGNOSIS — I50814 Right heart failure due to left heart failure: Secondary | ICD-10-CM | POA: Diagnosis not present

## 2016-12-13 DIAGNOSIS — I5022 Chronic systolic (congestive) heart failure: Secondary | ICD-10-CM | POA: Diagnosis not present

## 2016-12-13 DIAGNOSIS — I509 Heart failure, unspecified: Secondary | ICD-10-CM | POA: Diagnosis not present

## 2016-12-21 DIAGNOSIS — I50814 Right heart failure due to left heart failure: Secondary | ICD-10-CM | POA: Diagnosis not present

## 2016-12-21 DIAGNOSIS — I481 Persistent atrial fibrillation: Secondary | ICD-10-CM | POA: Diagnosis not present

## 2016-12-21 DIAGNOSIS — I509 Heart failure, unspecified: Secondary | ICD-10-CM | POA: Diagnosis not present

## 2016-12-21 DIAGNOSIS — I5022 Chronic systolic (congestive) heart failure: Secondary | ICD-10-CM | POA: Diagnosis not present

## 2016-12-21 DIAGNOSIS — I2510 Atherosclerotic heart disease of native coronary artery without angina pectoris: Secondary | ICD-10-CM | POA: Diagnosis not present

## 2016-12-21 DIAGNOSIS — I472 Ventricular tachycardia: Secondary | ICD-10-CM | POA: Diagnosis not present

## 2016-12-23 ENCOUNTER — Ambulatory Visit (INDEPENDENT_AMBULATORY_CARE_PROVIDER_SITE_OTHER): Payer: Medicare Other | Admitting: Family Medicine

## 2016-12-23 ENCOUNTER — Ambulatory Visit: Admitting: Family Medicine

## 2016-12-23 ENCOUNTER — Ambulatory Visit: Admitting: Internal Medicine

## 2016-12-23 VITALS — BP 106/64 | HR 64 | Temp 97.4°F

## 2016-12-23 DIAGNOSIS — M79604 Pain in right leg: Secondary | ICD-10-CM

## 2016-12-23 DIAGNOSIS — R296 Repeated falls: Secondary | ICD-10-CM

## 2016-12-23 DIAGNOSIS — I255 Ischemic cardiomyopathy: Secondary | ICD-10-CM

## 2016-12-23 NOTE — Patient Instructions (Signed)
Please do range of motion exercises twice a day  Apply heat two to three times a day  Tylenol as needed every 8-12 hours  If not better by beginning of next week, lets consider some physical therapy

## 2016-12-23 NOTE — Progress Notes (Signed)
Subjective:    Patient ID: [name] L Menefee, male    DOB: [birth year], 71 y.o.   MRN: [id number]  HPI This is a 71 yo male, accompanied by his wife. He presents today with recent falls. He has noticed difficulties with his coordination, this has been worse with amiodarone. Fell on step once after forgetting to go sideways, fell on kitchen floor when walker got ahead of him. Right leg pain. Never hit his head nor did he lose consciousness. No dizziness or lightheadedness.  No bruising.  No bleeding.  Has not taken any medication for symptoms.  Past Medical History:  Diagnosis Date  . AICD (automatic cardioverter/defibrillator) present   . Allergic rhinitis, cause unspecified   . Anxiety   . Arthritis    "all over" (11/07/2015)  . Atrial fibrillation (HCC)    a. 05/2015 - converted to sinus in setting of ICD shocks; placed on eliquis 5 bid.  [name]. CAD (coronary artery disease)   . Cervical herniated disc    told not to lift >10 lbs  . Chronic systolic CHF (congestive heart failure), NYHA class 2 (HCC)    Reports EF of 25%.   [name]. COPD (chronic obstructive pulmonary disease) (HCC) 11/2012   by xray  . Diverticulosis    by CT scan  . HCAP (healthcare-associated pneumonia) 11/06/2015  . History of lower GI bleeding 12/11/2011   "first time" (12/11/2011)  . History of MI (myocardial infarction) 1995   [place] living in [place], [place] stent, ?PTCA  . Hypertension   . Insomnia   . Ischemic cardiomyopathy    Echo 8/18: EF 20-25, diffuse HK, grade 2 DD, trivial AI, severe central MR, severe LAE, moderately reduced RVSF, moderate RAE, PASP 63  . Perennial allergic rhinitis    only to dust mites  . Pneumonia 2000s   "walking pneumonia"  . VT (ventricular tachycardia) (HCC)    a. 05/2015 - VT storm with multiple ICD shocks-->Amio 400 BID.   Past Surgical History:  Procedure Laterality Date  . CARDIAC CATHETERIZATION  2004   LAD 30%, D1 30%, CFX-AV groove 70-80%, OM1 30%, EF 20-25%  . CARDIAC  DEFIBRILLATOR PLACEMENT  2004  . CATARACT EXTRACTION W/ INTRAOCULAR LENS IMPLANT Right 01/2012  . COLONOSCOPY N/A 01/08/2012   Performed by Iva BoopGessner, Carl E[facility]  . CORONARY ANGIOPLASTY  1995   Pt thinks he got a balloon, [place]  . IMPLANTABLE CARDIOVERTER DEFIBRILLATOR GENERATOR CHANGE N/A 02/07/2012   Performed by Marinus Mawaylor, Gregg W, MD at [facility]  . INSERT / REPLACE / REMOVE PACEMAKER  2004   Medtronic ICD  . KNEE ARTHROSCOPY Left 05/2003   Hattie Perch/notes 06/19/2010  . LAPAROSCOPIC CHOLECYSTECTOMY  1/ 2012  . Left Heart Cath and Coronary Angiography N/A 09/28/2015   Performed by SwazilandJordan, Peter M, [facility]  . SHOULDER ARTHROSCOPY W/ ROTATOR CUFF REPAIR Right twice  . TONSILLECTOMY AND ADENOIDECTOMY  ~ 1951  . V Tach Ablation N/A 04/29/2016   Performed by Marinus Mawaylor, Gregg W, [facility]   Family History  Problem Relation Age of Onset  . Diabetes Father   . Tracheal cancer Father 5073       smoker  . Stroke Mother   . Cancer Sister        left eye  . CAD Neg Hx   . Colon cancer Neg Hx   . Prostate cancer Neg Hx    Social  History   Tobacco Use  . Smoking status: Former Smoker    Packs/day: 0.50    Years: 50.00    Pack years: 25.00    Types: Cigarettes, Cigars    Last attempt to quit: 09/27/2015    Years since quitting: 1.2  . Smokeless tobacco: Never Used  Substance Use Topics  . Alcohol use: No    Alcohol/week: 0.0 oz  . Drug use: No      Review of Systems Per HPI    Objective:   Physical Exam  Constitutional: He appears well-developed and well-nourished. No distress.  HENT:  Head: Normocephalic and atraumatic.  Eyes: Conjunctivae are normal.  Cardiovascular: Normal rate, regular rhythm and normal heart sounds.  Pulmonary/Chest: Effort normal and breath sounds normal.  Musculoskeletal: He exhibits no edema.       Right hip: He exhibits decreased range of motion, decreased strength and tenderness (gluteus). He  exhibits no bony tenderness, no swelling and no deformity.  Negative straight leg raise bilaterally.   Skin: He is not diaphoretic.  Vitals reviewed.     BP 106/64 (BP Location: Left Arm, Patient Position: Sitting, Cuff Size: Normal)   Pulse 64   Temp (!) 97.4 F (36.3 C) (Oral)   SpO2 95%  Wt Readings from Last 3 Encounters:  12/10/16 151 lb 6.4 oz (68.7 kg)  11/27/16 152 lb (68.9 kg)  11/15/16 150 lb 4 oz (68.2 kg)       Assessment & Plan:  1. Right leg pain -Seems to be more concentrated in the hip, encouraged him to take Tylenol 2-3 times a day, use heat as needed, do gentle range of motion exercises  -If no improvement with above I encouraged him to consider a few sessions of physical therapy to work on strength, balance and coordination -Follow-up as needed  2. Frequent falls -This does not seem to be a new problem for him, not sure if worsened by him being on amiodarone -Encouraged him to discuss this with his cardiologist at his follow-up visit   Olean Ree, FNP-BC  [facility] at [facility], [facility]  12/27/2016 10:43 AM

## 2016-12-25 ENCOUNTER — Encounter: Payer: Medicare Other | Admitting: Internal Medicine

## 2016-12-27 ENCOUNTER — Encounter: Payer: Self-pay | Admitting: Family Medicine

## 2016-12-27 ENCOUNTER — Telehealth: Payer: Self-pay | Admitting: Family Medicine

## 2016-12-27 DIAGNOSIS — I509 Heart failure, unspecified: Secondary | ICD-10-CM | POA: Diagnosis not present

## 2016-12-27 DIAGNOSIS — I472 Ventricular tachycardia: Secondary | ICD-10-CM | POA: Diagnosis not present

## 2016-12-27 DIAGNOSIS — I5022 Chronic systolic (congestive) heart failure: Secondary | ICD-10-CM | POA: Diagnosis not present

## 2016-12-27 DIAGNOSIS — I50814 Right heart failure due to left heart failure: Secondary | ICD-10-CM | POA: Diagnosis not present

## 2016-12-27 DIAGNOSIS — I2510 Atherosclerotic heart disease of native coronary artery without angina pectoris: Secondary | ICD-10-CM | POA: Diagnosis not present

## 2016-12-27 DIAGNOSIS — I481 Persistent atrial fibrillation: Secondary | ICD-10-CM | POA: Diagnosis not present

## 2016-12-27 NOTE — Telephone Encounter (Signed)
Received call from [facility] for community hospice regarding pt who has been taking 800 mg Skelaxin TID instead of 400 mg TID. He has run out and she is asking if I would refill it. Verbal OK given.

## 2016-12-30 ENCOUNTER — Other Ambulatory Visit: Payer: Self-pay | Admitting: Family Medicine

## 2016-12-30 ENCOUNTER — Telehealth: Payer: Self-pay | Admitting: Family Medicine

## 2016-12-30 DIAGNOSIS — M25551 Pain in right hip: Secondary | ICD-10-CM

## 2016-12-30 DIAGNOSIS — Z9181 History of falling: Secondary | ICD-10-CM

## 2016-12-30 NOTE — Telephone Encounter (Signed)
Copied from CRM [id number]. Topic: General - Other >> Dec 30, 2016  9:32 AM Raquel Sarna wrote: Pt says his leg isn't feeling better.  Needs to talk with Dr. To see what he needs to do since it isn't better.

## 2016-12-30 NOTE — Progress Notes (Signed)
See telephone encounter.

## 2016-12-30 NOTE — Telephone Encounter (Signed)
Called and spoke with patient.  He reports that after his visit his hip felt better, he tried to do range of motion on his own at home without much improvement.  Some days pain is worse than others.  Will check x-ray and if that is okay I have suggested he have a little physical therapy as he was really tight in his right hip.  Patient was okay with this plan.  I have entered the order and he will schedule to come in for x-ray.

## 2016-12-31 DIAGNOSIS — I509 Heart failure, unspecified: Secondary | ICD-10-CM | POA: Diagnosis not present

## 2016-12-31 DIAGNOSIS — I481 Persistent atrial fibrillation: Secondary | ICD-10-CM | POA: Diagnosis not present

## 2016-12-31 DIAGNOSIS — I2510 Atherosclerotic heart disease of native coronary artery without angina pectoris: Secondary | ICD-10-CM | POA: Diagnosis not present

## 2016-12-31 DIAGNOSIS — I50814 Right heart failure due to left heart failure: Secondary | ICD-10-CM | POA: Diagnosis not present

## 2016-12-31 DIAGNOSIS — I472 Ventricular tachycardia: Secondary | ICD-10-CM | POA: Diagnosis not present

## 2016-12-31 DIAGNOSIS — I5022 Chronic systolic (congestive) heart failure: Secondary | ICD-10-CM | POA: Diagnosis not present

## 2017-01-01 DIAGNOSIS — I472 Ventricular tachycardia: Secondary | ICD-10-CM | POA: Diagnosis not present

## 2017-01-01 DIAGNOSIS — I509 Heart failure, unspecified: Secondary | ICD-10-CM | POA: Diagnosis not present

## 2017-01-01 DIAGNOSIS — I2510 Atherosclerotic heart disease of native coronary artery without angina pectoris: Secondary | ICD-10-CM | POA: Diagnosis not present

## 2017-01-01 DIAGNOSIS — I50814 Right heart failure due to left heart failure: Secondary | ICD-10-CM | POA: Diagnosis not present

## 2017-01-01 DIAGNOSIS — I5022 Chronic systolic (congestive) heart failure: Secondary | ICD-10-CM | POA: Diagnosis not present

## 2017-01-01 DIAGNOSIS — I481 Persistent atrial fibrillation: Secondary | ICD-10-CM | POA: Diagnosis not present

## 2017-01-03 DIAGNOSIS — I50814 Right heart failure due to left heart failure: Secondary | ICD-10-CM | POA: Diagnosis not present

## 2017-01-03 DIAGNOSIS — I472 Ventricular tachycardia: Secondary | ICD-10-CM | POA: Diagnosis not present

## 2017-01-03 DIAGNOSIS — I2510 Atherosclerotic heart disease of native coronary artery without angina pectoris: Secondary | ICD-10-CM | POA: Diagnosis not present

## 2017-01-03 DIAGNOSIS — I509 Heart failure, unspecified: Secondary | ICD-10-CM | POA: Diagnosis not present

## 2017-01-03 DIAGNOSIS — I481 Persistent atrial fibrillation: Secondary | ICD-10-CM | POA: Diagnosis not present

## 2017-01-03 DIAGNOSIS — I5022 Chronic systolic (congestive) heart failure: Secondary | ICD-10-CM | POA: Diagnosis not present

## 2017-01-04 DIAGNOSIS — I2510 Atherosclerotic heart disease of native coronary artery without angina pectoris: Secondary | ICD-10-CM | POA: Diagnosis not present

## 2017-01-04 DIAGNOSIS — I509 Heart failure, unspecified: Secondary | ICD-10-CM | POA: Diagnosis not present

## 2017-01-04 DIAGNOSIS — I50814 Right heart failure due to left heart failure: Secondary | ICD-10-CM | POA: Diagnosis not present

## 2017-01-04 DIAGNOSIS — I481 Persistent atrial fibrillation: Secondary | ICD-10-CM | POA: Diagnosis not present

## 2017-01-04 DIAGNOSIS — I472 Ventricular tachycardia: Secondary | ICD-10-CM | POA: Diagnosis not present

## 2017-01-04 DIAGNOSIS — I5022 Chronic systolic (congestive) heart failure: Secondary | ICD-10-CM | POA: Diagnosis not present

## 2017-01-06 ENCOUNTER — Ambulatory Visit (INDEPENDENT_AMBULATORY_CARE_PROVIDER_SITE_OTHER)
Admission: RE | Admit: 2017-01-06 | Discharge: 2017-01-06 | Disposition: A | Discharge status: Home / Self Care | Source: Ambulatory Visit | Attending: Family Medicine | Admitting: Family Medicine

## 2017-01-06 DIAGNOSIS — M25551 Pain in right hip: Secondary | ICD-10-CM

## 2017-01-06 DIAGNOSIS — Z9181 History of falling: Secondary | ICD-10-CM | POA: Diagnosis not present

## 2017-01-10 DIAGNOSIS — I50814 Right heart failure due to left heart failure: Secondary | ICD-10-CM | POA: Diagnosis not present

## 2017-01-10 DIAGNOSIS — I509 Heart failure, unspecified: Secondary | ICD-10-CM | POA: Diagnosis not present

## 2017-01-10 DIAGNOSIS — I472 Ventricular tachycardia: Secondary | ICD-10-CM | POA: Diagnosis not present

## 2017-01-10 DIAGNOSIS — I2510 Atherosclerotic heart disease of native coronary artery without angina pectoris: Secondary | ICD-10-CM | POA: Diagnosis not present

## 2017-01-10 DIAGNOSIS — I5022 Chronic systolic (congestive) heart failure: Secondary | ICD-10-CM | POA: Diagnosis not present

## 2017-01-10 DIAGNOSIS — I481 Persistent atrial fibrillation: Secondary | ICD-10-CM | POA: Diagnosis not present

## 2017-01-14 DIAGNOSIS — I2510 Atherosclerotic heart disease of native coronary artery without angina pectoris: Secondary | ICD-10-CM | POA: Diagnosis not present

## 2017-01-14 DIAGNOSIS — I472 Ventricular tachycardia: Secondary | ICD-10-CM | POA: Diagnosis not present

## 2017-01-14 DIAGNOSIS — I50814 Right heart failure due to left heart failure: Secondary | ICD-10-CM | POA: Diagnosis not present

## 2017-01-14 DIAGNOSIS — I5022 Chronic systolic (congestive) heart failure: Secondary | ICD-10-CM | POA: Diagnosis not present

## 2017-01-14 DIAGNOSIS — I509 Heart failure, unspecified: Secondary | ICD-10-CM | POA: Diagnosis not present

## 2017-01-14 DIAGNOSIS — I481 Persistent atrial fibrillation: Secondary | ICD-10-CM | POA: Diagnosis not present

## 2017-01-17 ENCOUNTER — Telehealth: Payer: Self-pay | Admitting: Internal Medicine

## 2017-01-17 ENCOUNTER — Telehealth: Payer: Self-pay | Admitting: Physician Assistant

## 2017-01-17 DIAGNOSIS — I50814 Right heart failure due to left heart failure: Secondary | ICD-10-CM | POA: Diagnosis not present

## 2017-01-17 DIAGNOSIS — I5022 Chronic systolic (congestive) heart failure: Secondary | ICD-10-CM | POA: Diagnosis not present

## 2017-01-17 DIAGNOSIS — I481 Persistent atrial fibrillation: Secondary | ICD-10-CM | POA: Diagnosis not present

## 2017-01-17 DIAGNOSIS — I472 Ventricular tachycardia: Secondary | ICD-10-CM | POA: Diagnosis not present

## 2017-01-17 DIAGNOSIS — I509 Heart failure, unspecified: Secondary | ICD-10-CM | POA: Diagnosis not present

## 2017-01-17 DIAGNOSIS — I2510 Atherosclerotic heart disease of native coronary artery without angina pectoris: Secondary | ICD-10-CM | POA: Diagnosis not present

## 2017-01-17 NOTE — Telephone Encounter (Signed)
Lynnae Sandhoff, nurse on call for [facility], called answering service after-hours. Nurse went out and noted increased abdominal bloating and SOB. In the past, Lasix was increased from 20mg  to 40mg  for similar issue in 09/2016. Most recent blood pressure was 80/50. No change in weight. It appears his current hospice course has been active treatment rather than comfort care but I do not know him personally to have established this. Discharge summary in 09/2016 established the patient wishing to pursue reasonable treatment options to remain comfortable. Given hypotension and symptoms I advised he proceed to ER to discuss options for management for making this happen as I would be concerned that titrating Lasix would only worsen hypotension. I informed hospice that I am worried about his prognosis in this setting. They relayed recommendation to the patient who states he is actually not feeling that bad this evening and wishes to remain at home, continue current medicines, and he will contact hospice if anything worsens. Will forward to Dr. [name] to make him aware.  Quindarius Cabello PA-C

## 2017-01-17 NOTE — Telephone Encounter (Signed)
New Message     Pt c/o swelling: STAT is pt has developed SOB within 24 hours  1) How much weight have you gained and in what time span? no  2) If swelling, where is the swelling located?  Bloating no swelling   3) Are you currently taking a fluid pill? yes  4) Are you currently SOB?  no  5) Do you have a log of your daily weights (if so, list)? no  6) Have you gained 3 pounds in a day or 5 pounds in a week? no  7) Have you traveled recently? No    Not able to consume half of his meal , patient told her it was because lasik was reduced

## 2017-01-20 NOTE — Telephone Encounter (Signed)
Addressed by APP, see alternative note from 01/17/2017.  No further action at this time.

## 2017-01-24 DIAGNOSIS — I5022 Chronic systolic (congestive) heart failure: Secondary | ICD-10-CM | POA: Diagnosis not present

## 2017-01-24 DIAGNOSIS — I472 Ventricular tachycardia: Secondary | ICD-10-CM | POA: Diagnosis not present

## 2017-01-24 DIAGNOSIS — I2510 Atherosclerotic heart disease of native coronary artery without angina pectoris: Secondary | ICD-10-CM | POA: Diagnosis not present

## 2017-01-24 DIAGNOSIS — I481 Persistent atrial fibrillation: Secondary | ICD-10-CM | POA: Diagnosis not present

## 2017-01-24 DIAGNOSIS — I509 Heart failure, unspecified: Secondary | ICD-10-CM | POA: Diagnosis not present

## 2017-01-24 DIAGNOSIS — I50814 Right heart failure due to left heart failure: Secondary | ICD-10-CM | POA: Diagnosis not present

## 2017-01-29 DIAGNOSIS — J189 Pneumonia, unspecified organism: Secondary | ICD-10-CM | POA: Insufficient documentation

## 2017-01-29 DIAGNOSIS — I5022 Chronic systolic (congestive) heart failure: Secondary | ICD-10-CM | POA: Insufficient documentation

## 2017-01-29 DIAGNOSIS — I2510 Atherosclerotic heart disease of native coronary artery without angina pectoris: Secondary | ICD-10-CM | POA: Insufficient documentation

## 2017-01-29 DIAGNOSIS — J3089 Other allergic rhinitis: Secondary | ICD-10-CM | POA: Insufficient documentation

## 2017-01-29 DIAGNOSIS — I4891 Unspecified atrial fibrillation: Secondary | ICD-10-CM | POA: Insufficient documentation

## 2017-01-29 DIAGNOSIS — Z95810 Presence of automatic (implantable) cardiac defibrillator: Secondary | ICD-10-CM | POA: Insufficient documentation

## 2017-01-29 DIAGNOSIS — I10 Essential (primary) hypertension: Secondary | ICD-10-CM | POA: Insufficient documentation

## 2017-01-29 DIAGNOSIS — M5020 Other cervical disc displacement, unspecified cervical region: Secondary | ICD-10-CM | POA: Insufficient documentation

## 2017-01-29 DIAGNOSIS — K5790 Diverticulosis of intestine, part unspecified, without perforation or abscess without bleeding: Secondary | ICD-10-CM | POA: Insufficient documentation

## 2017-01-30 ENCOUNTER — Other Ambulatory Visit: Payer: Self-pay | Admitting: Family Medicine

## 2017-01-30 DIAGNOSIS — I50814 Right heart failure due to left heart failure: Secondary | ICD-10-CM | POA: Diagnosis not present

## 2017-01-30 DIAGNOSIS — I481 Persistent atrial fibrillation: Secondary | ICD-10-CM | POA: Diagnosis not present

## 2017-01-30 DIAGNOSIS — I509 Heart failure, unspecified: Secondary | ICD-10-CM | POA: Diagnosis not present

## 2017-01-30 DIAGNOSIS — I5022 Chronic systolic (congestive) heart failure: Secondary | ICD-10-CM | POA: Diagnosis not present

## 2017-01-30 DIAGNOSIS — I2510 Atherosclerotic heart disease of native coronary artery without angina pectoris: Secondary | ICD-10-CM | POA: Diagnosis not present

## 2017-01-30 DIAGNOSIS — I472 Ventricular tachycardia: Secondary | ICD-10-CM | POA: Diagnosis not present

## 2017-01-30 NOTE — Telephone Encounter (Signed)
[name] The pharmacy wanted to verify that pt is on Hospice now and also to verify that pt was supposed to be taking Skelaxin 800 mg tid instead of 400 mg tid. Please see 12/27/16 phone note. [name] request cb after Dr Para March reviews.

## 2017-01-30 NOTE — Telephone Encounter (Signed)
rec'd call from [place] at [facility].  Reported she was following up on a peculiar call she rec'd on 12/27/16 from "Lucy", and she identified herself as a Therapist, music.  She was calling in a refill for pt. for Metaxalone 800 mg, 1 tab. Tid.  Reported that she questioned the Hospice Nurse about billing information and MD's DEA or NPI.  The Hospice Nurse responded that she did not have that information at that time.  The Pharmacist reported that she was calling now to further investigate this, because she rec'd a call from the pt., stating he was out of the [facility].  [place] RPh, is asking if the pt. Is truly on Hospice, and if Dr. Para March will authorize a refill at this time.

## 2017-01-31 MED ORDER — METAXALONE 800 MG PO TABS: ORAL_TABLET | ORAL | 0 refills | Status: DC

## 2017-01-31 NOTE — Telephone Encounter (Addendum)
Please call community hospice to verify this.  [phone].  Patient is on hospice per cards.   The rx in question was for metaxalone and it shouldn't interact with his other listed meds.   The lower dose would be reasonable if effective.  Would be okay to use 800mg  dose if needed and tolerated.   I don't know if they actually need [place] to sign off on this- it may be able to come from the ordering hospice MD.  Thanks.

## 2017-01-31 NOTE — Telephone Encounter (Signed)
Spoke with [facility] and patient is indeed their patient.  Spoke with [place] at [place] and she says the patient was requesting a refill of the medication and needs that approval from you (or does that need to come through Hospice)?

## 2017-01-31 NOTE — Telephone Encounter (Signed)
Okay to send in.  Sent.  Thanks.

## 2017-02-04 DIAGNOSIS — I50814 Right heart failure due to left heart failure: Secondary | ICD-10-CM | POA: Diagnosis not present

## 2017-02-04 DIAGNOSIS — I509 Heart failure, unspecified: Secondary | ICD-10-CM | POA: Diagnosis not present

## 2017-02-04 DIAGNOSIS — I472 Ventricular tachycardia: Secondary | ICD-10-CM | POA: Diagnosis not present

## 2017-02-04 DIAGNOSIS — I2510 Atherosclerotic heart disease of native coronary artery without angina pectoris: Secondary | ICD-10-CM | POA: Diagnosis not present

## 2017-02-04 DIAGNOSIS — I481 Persistent atrial fibrillation: Secondary | ICD-10-CM | POA: Diagnosis not present

## 2017-02-04 DIAGNOSIS — I5022 Chronic systolic (congestive) heart failure: Secondary | ICD-10-CM | POA: Diagnosis not present

## 2017-02-07 ENCOUNTER — Encounter: Payer: Self-pay | Admitting: Family Medicine

## 2017-02-07 ENCOUNTER — Ambulatory Visit (INDEPENDENT_AMBULATORY_CARE_PROVIDER_SITE_OTHER): Admitting: Family Medicine

## 2017-02-07 VITALS — BP 102/60 | HR 60 | Temp 97.9°F | Wt 149.5 lb

## 2017-02-07 DIAGNOSIS — J984 Other disorders of lung: Secondary | ICD-10-CM | POA: Diagnosis not present

## 2017-02-07 DIAGNOSIS — M542 Cervicalgia: Secondary | ICD-10-CM | POA: Diagnosis not present

## 2017-02-07 DIAGNOSIS — I481 Persistent atrial fibrillation: Secondary | ICD-10-CM | POA: Diagnosis not present

## 2017-02-07 DIAGNOSIS — I509 Heart failure, unspecified: Secondary | ICD-10-CM | POA: Diagnosis not present

## 2017-02-07 DIAGNOSIS — I5022 Chronic systolic (congestive) heart failure: Secondary | ICD-10-CM | POA: Diagnosis not present

## 2017-02-07 DIAGNOSIS — T462X5A Adverse effect of other antidysrhythmic drugs, initial encounter: Secondary | ICD-10-CM | POA: Diagnosis not present

## 2017-02-07 DIAGNOSIS — I50814 Right heart failure due to left heart failure: Secondary | ICD-10-CM | POA: Diagnosis not present

## 2017-02-07 DIAGNOSIS — I2510 Atherosclerotic heart disease of native coronary artery without angina pectoris: Secondary | ICD-10-CM | POA: Diagnosis not present

## 2017-02-07 DIAGNOSIS — I472 Ventricular tachycardia: Secondary | ICD-10-CM | POA: Diagnosis not present

## 2017-02-07 MED ORDER — PREDNISONE 10 MG PO TABS: ORAL_TABLET | ORAL | 0 refills | Status: DC

## 2017-02-07 NOTE — Progress Notes (Signed)
Has pain down the R and L arm equally with looking upward, with neck extension.  No pain on R shoulder ROM.  Sensation in the hands can be variable and that can affect his grip.  Not an acute complaint; has been ongoing.    Still on amiodarone.  He is still on O2 at baseline.  Today he felt a little weaker in his B legs, not just unilateral.  No arm weakness.  No focal changes, seems to be a general change.  Wife it begun to notice some memory changes, diffuse weakness, occasional tremor.  These are faint changes compared to previous but they were similar to the types of changes she noted when the patient had amiodarone toxicity.  Meds, vitals, and allergies reviewed.   ROS: Per HPI unless specifically indicated in ROS section   nad ncat On O2  No inc WOB Neck supple but pain with ROM, he has some B arm pain with upward gaze.  Not a stiff neck.  No lymphadenopathy. rrr ctab abd soft Ext w/o edema.  S/S grossly WNL BUE

## 2017-02-07 NOTE — Patient Instructions (Signed)
Start taking prednisone with food and update me if not any better.  I'll check with cardiology in the meantime.  Take care.  Glad to see you.

## 2017-02-09 DIAGNOSIS — M542 Cervicalgia: Secondary | ICD-10-CM | POA: Insufficient documentation

## 2017-02-09 NOTE — Assessment & Plan Note (Signed)
I will notify cardiology and ask if there is any way he his amiodarone dose can be reduced.  See above.

## 2017-02-09 NOTE — Assessment & Plan Note (Signed)
He likely has cervical nerve root compression, causing bilateral upper extremity symptoms with neck extension.  Discussed with patient about options.  I cannot see that he would be a surgical candidate, he agrees.  He does not need imaging at this point.  Discussed with patient about options. Start taking prednisone with food and update me if not any better.  He agrees.

## 2017-02-13 ENCOUNTER — Telehealth: Payer: Self-pay | Admitting: *Deleted

## 2017-02-13 NOTE — Telephone Encounter (Signed)
ICD Carelink alert received. VT episodes, ATP and 1 shock this morning 0831. LMOM to return call to device clinic.

## 2017-02-14 ENCOUNTER — Telehealth: Payer: Self-pay | Admitting: Family Medicine

## 2017-02-14 MED ORDER — AMIODARONE HCL 200 MG PO TABS: 200.0000 mg | ORAL_TABLET | Freq: Every day | ORAL | Status: DC

## 2017-02-14 NOTE — Telephone Encounter (Signed)
Patient advised but seemed a bit confused and will ask wife to return call when she awakens.

## 2017-02-14 NOTE — Telephone Encounter (Signed)
Pt's wife informed of notes of Dr. Para March on 02/15/16. Understanding verbalized.

## 2017-02-14 NOTE — Telephone Encounter (Signed)
Call pt and wife.  Would reduce amiodarone down to 200mg  a day.  I talked with Dr. [name] about this- they may have already contacted patient about this.  Please update us/cardiology as needed.  Thanks.

## 2017-02-15 DIAGNOSIS — I481 Persistent atrial fibrillation: Secondary | ICD-10-CM | POA: Diagnosis not present

## 2017-02-15 DIAGNOSIS — I509 Heart failure, unspecified: Secondary | ICD-10-CM | POA: Diagnosis not present

## 2017-02-15 DIAGNOSIS — I472 Ventricular tachycardia: Secondary | ICD-10-CM | POA: Diagnosis not present

## 2017-02-15 DIAGNOSIS — I2510 Atherosclerotic heart disease of native coronary artery without angina pectoris: Secondary | ICD-10-CM | POA: Diagnosis not present

## 2017-02-15 DIAGNOSIS — I5022 Chronic systolic (congestive) heart failure: Secondary | ICD-10-CM | POA: Diagnosis not present

## 2017-02-15 DIAGNOSIS — I50814 Right heart failure due to left heart failure: Secondary | ICD-10-CM | POA: Diagnosis not present

## 2017-02-17 DIAGNOSIS — Z515 Encounter for palliative care: Secondary | ICD-10-CM

## 2017-02-18 ENCOUNTER — Ambulatory Visit (INDEPENDENT_AMBULATORY_CARE_PROVIDER_SITE_OTHER): Payer: Medicare Other | Admitting: Internal Medicine

## 2017-02-18 ENCOUNTER — Other Ambulatory Visit: Payer: Self-pay | Admitting: Internal Medicine

## 2017-02-18 ENCOUNTER — Encounter: Payer: Self-pay | Admitting: Internal Medicine

## 2017-02-18 VITALS — BP 117/71 | HR 64 | Ht 63.0 in | Wt 147.0 lb

## 2017-02-18 DIAGNOSIS — I5022 Chronic systolic (congestive) heart failure: Secondary | ICD-10-CM | POA: Diagnosis not present

## 2017-02-18 DIAGNOSIS — I4901 Ventricular fibrillation: Secondary | ICD-10-CM

## 2017-02-18 DIAGNOSIS — Z95810 Presence of automatic (implantable) cardiac defibrillator: Secondary | ICD-10-CM | POA: Diagnosis not present

## 2017-02-18 MED ORDER — NITROGLYCERIN 0.4 MG SL SUBL: 0.4000 mg | SUBLINGUAL_TABLET | SUBLINGUAL | 3 refills | Status: AC | PRN

## 2017-02-18 MED ORDER — SACUBITRIL-VALSARTAN 24-26 MG PO TABS: 1.0000 | ORAL_TABLET | Freq: Two times a day (BID) | ORAL | 11 refills | Status: AC

## 2017-02-18 NOTE — Progress Notes (Signed)
HPI [name] returns today for followup. He is a 72 yo man with a long standing ICM, s/p MI, PAF, VT, s/p ICD insertion with multiple ICD shocks. He is near endstage. He has had problems with amiodarone. He had gone 3 months without an ICD shock on 400 mg daily. He denies chest pain. He has class 3 symptoms. Previously he developed muscle pain on lisinopril. His creatinine clearance is around 60. In the last week he has had 4 episodes of VT for which he was asymptomatic. I suspect he passed out and was unconscious when he was defibrillated. Allergies  Allergen Reactions  . Ace Inhibitors Other (See Comments)    muscle pain. Tolerates ARBs.   . Codeine Other (See Comments)    "head wants to explode."  . Doxycycline Diarrhea and Nausea And Vomiting  . Kionex [Sodium Polystyrene Sulfonate] Other (See Comments)    SOB, pressure in chest, weakness fatigue.   [name] Penicillins Swelling    "started at point of injection; w/in 3 min my upper arm was swollen 3 times normal" Has patient had a PCN reaction causing immediate rash, facial/tongue/throat swelling, SOB or lightheadedness with hypotension: Yes Has patient had a PCN reaction causing severe rash involving mucus membranes or skin necrosis: No Has patient had a PCN reaction that required hospitalization No Has patient had a PCN reaction occurring within the last 10 years: No If all of the above answers are "NO", then may proceed wi  . Lisinopril Other (See Comments)    Muscle Pain  . Statins Other (See Comments)    Myalgias per patient  . Hydrocodone Other (See Comments)    Severe headache, esp when combined with skelaxin.    . Lidocaine     Hallucinations, jerking  . Pacerone [Amiodarone] Other (See Comments)  . Xanax [Alprazolam] Other (See Comments)    Nightmares.       Current Outpatient Medications  Medication Sig Dispense Refill  . acetaminophen (TYLENOL) 325 MG tablet Take 2 tablets (650 mg total) by mouth every 6 (six)  hours as needed for mild pain (or Fever >/= 101).    [name] albuterol (PROAIR HFA) 108 (90 Base) MCG/ACT inhaler INHALE 1-2 PUFFS INTO THE LUNGS EVERY 6 (SIX) HOURS AS NEEDED FOR WHEEZING OR SHORTNESS OF BREATH. 8.5 Inhaler 0  . amiodarone (PACERONE) 200 MG tablet Take 1 tablet (200 mg total) by mouth daily.    [name] apixaban (ELIQUIS) 2.5 MG TABS tablet Take 1 tablet (2.5 mg total) by mouth 2 (two) times daily. 180 tablet 3  . benzonatate (TESSALON) 200 MG capsule TAKE 1 CAPSULE BY MOUTH 3 TIMES DAILY AS NEEDED FOR COUGH 30 capsule 1  . budesonide-formoterol (SYMBICORT) 160-4.5 MCG/ACT inhaler Inhale 2 puffs into the lungs 2 (two) times daily. 1 Inhaler 6  . Chlorpheniramine-APAP (CORICIDIN) 2-325 MG TABS Take 2 tablets by mouth daily as needed (congestion/runny nose).    . ferrous sulfate (FERROUSUL) 325 (65 FE) MG tablet Take 1 tablet (325 mg total) by mouth daily with breakfast.    . Guaifenesin (MUCINEX MAXIMUM STRENGTH) 1200 MG TB12 Take 1,200 mg by mouth 2 (two) times daily.    . isosorbide mononitrate (IMDUR) 30 MG 24 hr tablet Take 30 mg daily by mouth.    . levalbuterol (XOPENEX) 0.63 MG/3ML nebulizer solution Take 0.63 mg by nebulization every 4 (four) hours as needed for wheezing or shortness of breath.    . loperamide (IMODIUM) 2 MG capsule Take 1 capsule (  2 mg total) by mouth See admin instructions. Take 1 capsule (2 mg) by mouth after each bowel movement    . metaxalone (SKELAXIN) 800 MG tablet TAKE 0.5-1 TABLET BY MOUTH 3 TIMES A DAY AS NEEDED FOR MUSCLE SPASMS. 42 tablet 0  . mexiletine (MEXITIL) 150 MG capsule Take 2 capsules (300 mg total) by mouth every 8 (eight) hours. 8am, 3:30pm, 11:30pm 180 capsule 11  . nitroGLYCERIN (NITROSTAT) 0.4 MG SL tablet Place 1 tablet (0.4 mg total) under the tongue every 5 (five) minutes as needed for chest pain.    . OXYGEN Inhale 2 L into the lungs continuous.    . promethazine (PHENERGAN) 25 MG tablet Take 25 mg by mouth every 6 (six) hours as needed for  nausea or vomiting.    [name] RANEXA 500 MG 12 hr tablet TAKE 1 TABLET (500 MG TOTAL) BY MOUTH 2 (TWO) TIMES DAILY.  3  . ranitidine (ZANTAC) 150 MG capsule Take 150 mg by mouth daily as needed for heartburn.     . Simethicone (GAS-X PO) Take 1-2 capsules by mouth daily as needed (pressure/bloating/gas). As needed.     . traMADol (ULTRAM) 50 MG tablet Take 1 tablet (50 mg total) by mouth every 8 (eight) hours as needed (for pain). 100 tablet 0  . zolpidem (AMBIEN) 10 MG tablet TAKE 0.5-1 TABLETS (5-10 MG TOTAL) BY MOUTH AT BEDTIME AS NEEDED FOR SLEEP 30 tablet 5  . furosemide (LASIX) 40 MG tablet Take 1 tablet (40 mg total) by mouth daily. 90 tablet 3   No current facility-administered medications for this visit.      Past Medical History:  Diagnosis Date  . AICD (automatic cardioverter/defibrillator) present   . Allergic rhinitis, cause unspecified   . Anxiety   . Arthritis    "all over" (11/07/2015)  . Atrial fibrillation (HCC)    a. 05/2015 - converted to sinus in setting of ICD shocks; placed on eliquis 5 bid.  [name] CAD (coronary artery disease)   . Cervical herniated disc    told not to lift >10 lbs  . Chronic systolic CHF (congestive heart failure), NYHA class 2 (HCC)    Reports EF of 25%.   [name] COPD (chronic obstructive pulmonary disease) (HCC) 11/2012   by xray  . Diverticulosis    by CT scan  . HCAP (healthcare-associated pneumonia) 11/06/2015  . History of lower GI bleeding 12/11/2011   "first time" (12/11/2011)  . History of MI (myocardial infarction) 1995   Pt living in [place], no stent, ?PTCA  . Hypertension   . Insomnia   . Ischemic cardiomyopathy    Echo 8/18: EF 20-25, diffuse HK, grade 2 DD, trivial AI, severe central MR, severe LAE, moderately reduced RVSF, moderate RAE, PASP 63  . Perennial allergic rhinitis    only to dust mites  . Pneumonia 2000s   "walking pneumonia"  . VT (ventricular tachycardia) (HCC)    a. 05/2015 - VT storm with multiple ICD shocks-->Amio 400  BID.    ROS:   All systems reviewed and negative except as noted in the HPI.   Past Surgical History:  Procedure Laterality Date  . CARDIAC CATHETERIZATION  2004   LAD 30%, D1 30%, CFX-AV groove 70-80%, OM1 30%, EF 20-25%  . CARDIAC CATHETERIZATION N/A 09/28/2015   Procedure: Left Heart Cath and Coronary Angiography;  Surgeon: Peter M Swaziland, MD;  [facility] CV LAB;  Service: Cardiovascular;  Laterality: N/A;  . CARDIAC DEFIBRILLATOR PLACEMENT  2004  .  CATARACT EXTRACTION W/ INTRAOCULAR LENS IMPLANT Right 01/2012  . COLONOSCOPY  01/08/2012   Procedure: COLONOSCOPY;  Surgeon: Iva Boop, MD;  Location: [facility] ENDOSCOPY;  Service: Endoscopy;  Laterality: N/A;  . CORONARY ANGIOPLASTY  1995   Pt thinks he got a balloon, living in [place], [place]  . IMPLANTABLE CARDIOVERTER DEFIBRILLATOR GENERATOR CHANGE N/A 02/07/2012   Procedure: IMPLANTABLE CARDIOVERTER DEFIBRILLATOR GENERATOR CHANGE;  Surgeon: Marinus Maw, MD; Medtronic Evera XT VR single-chamber serial number [id number], Laterality: Left  . INSERT / REPLACE / REMOVE PACEMAKER  2004   Medtronic ICD  . KNEE ARTHROSCOPY Left 05/2003   Hattie Perch 06/19/2010  . LAPAROSCOPIC CHOLECYSTECTOMY  1/ 2012  . SHOULDER ARTHROSCOPY W/ ROTATOR CUFF REPAIR Right twice  . TONSILLECTOMY AND ADENOIDECTOMY  ~ 1951  . V TACH ABLATION N/A 04/29/2016   Procedure: V Tach Ablation;  Surgeon: Marinus Maw, MD;  Location: [facility] INVASIVE CV LAB;  Service: Cardiovascular;  Laterality: N/A;     Family History  Problem Relation Age of Onset  . Diabetes Father   . Tracheal cancer Father 18       smoker  . Stroke Mother   . Cancer Sister        left eye  . CAD Neg Hx   . Colon cancer Neg Hx   . Prostate cancer Neg Hx      Social History   Socioeconomic History  . Marital status: Married    Spouse name: Not on file  . Number of children: 0  . Years of education: Not on file  . Highest education level: Not on file  Social Needs  . Financial  resource strain: Not on file  . Food insecurity - worry: Not on file  . Food insecurity - inability: Not on file  . Transportation needs - medical: Not on file  . Transportation needs - non-medical: Not on file  Occupational History  . 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 Smoking status: Former Smoker    Packs/day: 0.50    Years: 50.00    Pack years: 25.00    Types: Cigarettes, Cigars    Last attempt to quit: 09/27/2015    Years since quitting: 1.3  . Smokeless tobacco: Never Used  Substance and Sexual Activity  . Alcohol use: No    Alcohol/week: 0.0 oz  . Drug use: No  . Sexual activity: Not Currently  Other Topics Concern  . Not on file  Social History Narrative   Lives with wife, married 1998   Grown children, 2 great grandchildren   Occupation: retired, was Presenter, broadcasting   Activity: walking, fishing   Diet: good water daily, fruits/vegetables rare      Wife is Product manager.    [id number], Human resources officer. No known agent orange exposure.       BP 117/71   Pulse 64   Ht 5\' 3"  (1.6 m)   Wt 147 lb (66.7 kg)   BMI 26.04 kg/m   Physical Exam:  Well appearing 72 yo man, NAD HEENT: Unremarkable Neck:  6 cm JVD, no thyromegally Lymphatics:  No adenopathy Back:  No CVA tenderness Lungs:  Clear except for scattered basilar rales HEART:  Regular rate rhythm, no murmurs, no rubs, no clicks Abd:  soft, positive bowel sounds, no organomegally, no rebound, no guarding Ext:  2 plus pulses, no edema, no cyanosis, no clubbing Skin:  No rashes no nodules Neuro:  CN II through XII intact, motor grossly intact  EKG - NSR with RBBB  DEVICE  Normal device function.  See PaceArt for details.   Assess/Plan: 1. VT - he will continue the amio 200 daily along with Mexilitine. If he has more VT, I would imagine increasing the amio temporarily. 2. Chronic systolic heart failure - he has tolerated an ARB in the past. I have asked him to start [facility] 24/26 daily and will  plan to uptitrate in 2 weeks. He will return for labs at that time. 3. Chronic renal insufficiency - his serum creatinine appears to be stable. Will follow. 4. COPD - he is taking his symbicort on a prn basis and I have instructed him to take it daily as prescribed.   [name].D.

## 2017-02-18 NOTE — Patient Instructions (Addendum)
Medication Instructions:  Your physician has recommended you make the following change in your medication:   1. Increase Lasix to one tablet (40 mg) by mouth two times per day, for 3 days 2. After 3 days, return to on tablet (40 mg) by mouth one time per day 3. Stop taking Imdur (isosorbide mononitrate) until further notice 4. Begin Entresto 24/26, one tablet by mouth two times per day.  Labwork:  BMP ordered in 2 weeks.   Testing/Procedures:  None ordered.  Follow-Up:  Follow up with a blood pressure visit in 2 weeks. You will also get a BMP at the same day.   Your physician wants you to follow-up in: in three months with Dr [name].   Any Other Special Instructions Will Be Listed Below (If Applicable).       If you need a refill on your cardiac medications before your next appointment, please call your pharmacy.

## 2017-02-20 ENCOUNTER — Telehealth: Payer: Self-pay | Admitting: *Deleted

## 2017-02-20 LAB — CUP PACEART INCLINIC DEVICE CHECK
Battery Remaining Longevity: 30 mo
Battery Voltage: 2.89 V
Date Time Interrogation Session: 20190115202350
HighPow Impedance: 50 Ohm
HighPow Impedance: 61 Ohm
Implantable Lead Implant Date: 20040722
Implantable Pulse Generator Implant Date: 20140103
Lead Channel Impedance Value: 494 Ohm
Lead Channel Pacing Threshold Pulse Width: 0.4 ms
Lead Channel Setting Pacing Amplitude: 2.5 V
Lead Channel Setting Pacing Pulse Width: 0.4 ms
MDC IDC LEAD LOCATION: 753860
MDC IDC MSMT LEADCHNL RV IMPEDANCE VALUE: 399 Ohm
MDC IDC MSMT LEADCHNL RV PACING THRESHOLD AMPLITUDE: 0.75 V
MDC IDC MSMT LEADCHNL RV SENSING INTR AMPL: 2.625 mV
MDC IDC MSMT LEADCHNL RV SENSING INTR AMPL: 3.875 mV
MDC IDC SET LEADCHNL RV SENSING SENSITIVITY: 0.3 mV
MDC IDC STAT BRADY RV PERCENT PACED: 0.08 %

## 2017-02-20 NOTE — Telephone Encounter (Signed)
Prior authorization done for ENTRESTO through covermymeds online.

## 2017-02-21 DIAGNOSIS — I472 Ventricular tachycardia: Secondary | ICD-10-CM | POA: Diagnosis not present

## 2017-02-21 DIAGNOSIS — I2510 Atherosclerotic heart disease of native coronary artery without angina pectoris: Secondary | ICD-10-CM | POA: Diagnosis not present

## 2017-02-21 DIAGNOSIS — I5022 Chronic systolic (congestive) heart failure: Secondary | ICD-10-CM | POA: Diagnosis not present

## 2017-02-21 DIAGNOSIS — I481 Persistent atrial fibrillation: Secondary | ICD-10-CM | POA: Diagnosis not present

## 2017-02-21 DIAGNOSIS — I509 Heart failure, unspecified: Secondary | ICD-10-CM | POA: Diagnosis not present

## 2017-02-21 DIAGNOSIS — I50814 Right heart failure due to left heart failure: Secondary | ICD-10-CM | POA: Diagnosis not present

## 2017-02-21 NOTE — Telephone Encounter (Signed)
**Note De-Identified [name] Obfuscation** We received a letter [name] fax from [facility] stating that they have denied the pts [name] PA.  I called [employer] FEP Call [place] [phone]) and did an appeal over the phone with [name].  Per [name] this PA has been approved for the pts lifetime.

## 2017-02-28 DIAGNOSIS — I472 Ventricular tachycardia: Secondary | ICD-10-CM | POA: Diagnosis not present

## 2017-02-28 DIAGNOSIS — I481 Persistent atrial fibrillation: Secondary | ICD-10-CM | POA: Diagnosis not present

## 2017-02-28 DIAGNOSIS — I5022 Chronic systolic (congestive) heart failure: Secondary | ICD-10-CM | POA: Diagnosis not present

## 2017-02-28 DIAGNOSIS — I50814 Right heart failure due to left heart failure: Secondary | ICD-10-CM | POA: Diagnosis not present

## 2017-02-28 DIAGNOSIS — I2510 Atherosclerotic heart disease of native coronary artery without angina pectoris: Secondary | ICD-10-CM | POA: Diagnosis not present

## 2017-02-28 DIAGNOSIS — I509 Heart failure, unspecified: Secondary | ICD-10-CM | POA: Diagnosis not present

## 2017-03-04 ENCOUNTER — Other Ambulatory Visit: Admitting: *Deleted

## 2017-03-04 ENCOUNTER — Other Ambulatory Visit: Payer: Self-pay | Admitting: Family Medicine

## 2017-03-04 ENCOUNTER — Ambulatory Visit (INDEPENDENT_AMBULATORY_CARE_PROVIDER_SITE_OTHER): Admitting: Pharmacist

## 2017-03-04 VITALS — BP 110/58 | HR 60

## 2017-03-04 DIAGNOSIS — I255 Ischemic cardiomyopathy: Secondary | ICD-10-CM | POA: Diagnosis not present

## 2017-03-04 DIAGNOSIS — I5022 Chronic systolic (congestive) heart failure: Secondary | ICD-10-CM

## 2017-03-04 DIAGNOSIS — I4901 Ventricular fibrillation: Secondary | ICD-10-CM

## 2017-03-04 DIAGNOSIS — Z95810 Presence of automatic (implantable) cardiac defibrillator: Secondary | ICD-10-CM | POA: Diagnosis not present

## 2017-03-04 NOTE — Patient Instructions (Signed)
Thank you for coming in today. We will call with blood results.  Continue Entresto 24/26mg  twice daily. Please continue to keep check on blood pressure occasionally and call our office with any issues.

## 2017-03-04 NOTE — Telephone Encounter (Signed)
Electronic refill request. Metaxalone Last office visit:   02/07/2017 Last Filled:    42 tablet 0 01/31/2017  PLEASE SEND A NEW RX WITH ADDITIONAL REFILLS FOR THIS MEDICATION PER PT REQUEST

## 2017-03-04 NOTE — Progress Notes (Signed)
Patient ID: [name]                 DOB: 02-20-1945                      MRN: [id number]     HPI: [name] is a 72 y.o. male patient of Dr. [name] who presents today for mediation titration. PMH significant for long standing ICM, s/p MI, PAF, VT, s/p ICD insertion with multiple ICD shocks. At his last visit with Dr. [name] about 2 weeks ago he was started on Entresto 24/26mg  BID.   He returns today for blood work and dose titration with his wife. He reports he has done well on Entresto. He does not note any additional dizziness with the medication. He does have some dizziness at baseline which is mostly mechanical per his wife's report.  He has not used nitro since starting Entresto. Has had a few headaches but not more than usual.    Current HTN meds:  Entresto 24/26mg  BID  Previously tried: lisinopril - muscle pain, ACEi - muscle pains, but tolerates ARBs  BP goal: <130/80  Family History: father - DM, Tracheal cancer, mother - stroke, sister - cancer  Social History: former smoker - 18 months ago. Denies alcohol.   Diet: He eats most meals out. He does not add salt to food. Does not drink caffeinated products.   Exercise: walking when shopping. He is active at home.   Home BP readings: 117/72 when checked this morning  Wt Readings from Last 3 Encounters:  02/18/17 147 lb (66.7 kg)  02/07/17 149 lb 8 oz (67.8 kg)  12/10/16 151 lb 6.4 oz (68.7 kg)   BP Readings from Last 3 Encounters:  03/04/17 (!) 110/58  02/18/17 117/71  02/07/17 102/60   Pulse Readings from Last 3 Encounters:  03/04/17 60  02/18/17 64  02/07/17 60    Renal function: CrCl cannot be calculated (Unknown ideal weight.).  Past Medical History:  Diagnosis Date  . AICD (automatic cardioverter/defibrillator) present   . Allergic rhinitis, cause unspecified   . Anxiety   . Arthritis    "all over" (11/07/2015)  . Atrial fibrillation (HCC)    a. 05/2015 - converted to sinus in setting of  ICD shocks; placed on eliquis 5 bid.  [name] CAD (coronary artery disease)   . Cervical herniated disc    told not to lift >10 lbs  . Chronic systolic CHF (congestive heart failure), NYHA class 2 (HCC)    Reports EF of 25%.   [name] COPD (chronic obstructive pulmonary disease) (HCC) 11/2012   by xray  . Diverticulosis    by CT scan  . HCAP (healthcare-associated pneumonia) 11/06/2015  . History of lower GI bleeding 12/11/2011   "first time" (12/11/2011)  . History of MI (myocardial infarction) 1995   Pt living in [place], no stent, ?PTCA  . Hypertension   . Insomnia   . Ischemic cardiomyopathy    Echo 8/18: EF 20-25, diffuse HK, grade 2 DD, trivial AI, severe central MR, severe LAE, moderately reduced RVSF, moderate RAE, PASP 63  . Perennial allergic rhinitis    only to dust mites  . Pneumonia 2000s   "walking pneumonia"  . VT (ventricular tachycardia) (HCC)    a. 05/2015 - VT storm with multiple ICD shocks-->Amio 400 BID.    Current Outpatient Medications on File Prior to Visit  Medication Sig Dispense Refill  . acetaminophen (TYLENOL) 325 MG tablet Take  2 tablets (650 mg total) by mouth every 6 (six) hours as needed for mild pain (or Fever >/= 101).    [name] albuterol (PROAIR HFA) 108 (90 Base) MCG/ACT inhaler INHALE 1-2 PUFFS INTO THE LUNGS EVERY 6 (SIX) HOURS AS NEEDED FOR WHEEZING OR SHORTNESS OF BREATH. 8.5 Inhaler 0  . amiodarone (PACERONE) 200 MG tablet Take 1 tablet (200 mg total) by mouth daily.    [name] apixaban (ELIQUIS) 2.5 MG TABS tablet Take 1 tablet (2.5 mg total) by mouth 2 (two) times daily. 180 tablet 3  . benzonatate (TESSALON) 200 MG capsule TAKE 1 CAPSULE BY MOUTH 3 TIMES DAILY AS NEEDED FOR COUGH 30 capsule 1  . budesonide-formoterol (SYMBICORT) 160-4.5 MCG/ACT inhaler Inhale 2 puffs into the lungs 2 (two) times daily. 1 Inhaler 6  . Chlorpheniramine-APAP (CORICIDIN) 2-325 MG TABS Take 2 tablets by mouth daily as needed (congestion/runny nose).    . ferrous sulfate (FERROUSUL)  325 (65 FE) MG tablet Take 1 tablet (325 mg total) by mouth daily with breakfast.    . furosemide (LASIX) 40 MG tablet Take 1 tablet (40 mg total) by mouth daily. 90 tablet 3  . Guaifenesin (MUCINEX MAXIMUM STRENGTH) 1200 MG TB12 Take 1,200 mg by mouth 2 (two) times daily.    [name] levalbuterol (XOPENEX) 0.63 MG/3ML nebulizer solution Take 0.63 mg by nebulization every 4 (four) hours as needed for wheezing or shortness of breath.    . loperamide (IMODIUM) 2 MG capsule Take 1 capsule (2 mg total) by mouth See admin instructions. Take 1 capsule (2 mg) by mouth after each bowel movement    . mexiletine (MEXITIL) 150 MG capsule Take 2 capsules (300 mg total) by mouth every 8 (eight) hours. 8am, 3:30pm, 11:30pm 180 capsule 11  . nitroGLYCERIN (NITROSTAT) 0.4 MG SL tablet Place 1 tablet (0.4 mg total) under the tongue every 5 (five) minutes as needed for chest pain. 30 tablet 3  . OXYGEN Inhale 2 L into the lungs continuous.    . promethazine (PHENERGAN) 25 MG tablet Take 25 mg by mouth every 6 (six) hours as needed for nausea or vomiting.    [name] RANEXA 500 MG 12 hr tablet TAKE 1 TABLET (500 MG TOTAL) BY MOUTH 2 (TWO) TIMES DAILY.  3  . ranitidine (ZANTAC) 150 MG capsule Take 150 mg by mouth daily as needed for heartburn.     . sacubitril-valsartan (ENTRESTO) 24-26 MG Take 1 tablet by mouth 2 (two) times daily. 60 tablet 11  . Simethicone (GAS-X PO) Take 1-2 capsules by mouth daily as needed (pressure/bloating/gas). As needed.     . traMADol (ULTRAM) 50 MG tablet Take 1 tablet (50 mg total) by mouth every 8 (eight) hours as needed (for pain). 100 tablet 0  . zolpidem (AMBIEN) 10 MG tablet TAKE 0.5-1 TABLETS (5-10 MG TOTAL) BY MOUTH AT BEDTIME AS NEEDED FOR SLEEP 30 tablet 5   No current facility-administered medications on file prior to visit.     Allergies  Allergen Reactions  . Ace Inhibitors Other (See Comments)    muscle pain. Tolerates ARBs.   . Codeine Other (See Comments)    "head wants to  explode."  . Doxycycline Diarrhea and Nausea And Vomiting  . Kionex [Sodium Polystyrene Sulfonate] Other (See Comments)    SOB, pressure in chest, weakness fatigue.   [name] Penicillins Swelling    "started at point of injection; w/in 3 min my upper arm was swollen 3 times normal" Has patient had a PCN reaction causing  immediate rash, facial/tongue/throat swelling, SOB or lightheadedness with hypotension: Yes Has patient had a PCN reaction causing severe rash involving mucus membranes or skin necrosis: No Has patient had a PCN reaction that required hospitalization No Has patient had a PCN reaction occurring within the last 10 years: No If all of the above answers are "NO", then may proceed wi  . Lisinopril Other (See Comments)    Muscle Pain  . Statins Other (See Comments)    Myalgias per patient  . Hydrocodone Other (See Comments)    Severe headache, esp when combined with skelaxin.    . Lidocaine     Hallucinations, jerking  . Pacerone [Amiodarone] Other (See Comments)  . Xanax [Alprazolam] Other (See Comments)    Nightmares.      Blood pressure (!) 110/58, pulse 60.   Assessment/Plan: Hypertension: BMET today. I am doubtful that patient will tolerate dose increase of medication due to borderline low blood pressures and other comorbidities. Will continue Entresto 24/26mg  BID. He is working with hospice for patient assistance for the medication. Advised if hospice unable to find assistance to contact our office as we can set up with [employer] or other patient assistance if warranted. Follow up with Dr. [name] as recommended and HTN clinic as needed.    Thank you, Freddie Apley. Cleatis Polka, PharmD  [facility]

## 2017-03-05 LAB — BASIC METABOLIC PANEL
BUN / CREAT RATIO: 20 (ref 10–24)
BUN: 27 mg/dL (ref 8–27)
CALCIUM: 8.5 mg/dL — AB (ref 8.6–10.2)
CHLORIDE: 103 mmol/L (ref 96–106)
CO2: 18 mmol/L — ABNORMAL LOW (ref 20–29)
Creatinine, Ser: 1.38 mg/dL — ABNORMAL HIGH (ref 0.76–1.27)
GFR, EST AFRICAN AMERICAN: 59 mL/min/{1.73_m2} — AB (ref >59)
GFR, EST NON AFRICAN AMERICAN: 51 mL/min/{1.73_m2} — AB (ref >59)
Glucose: 161 mg/dL — ABNORMAL HIGH (ref 65–99)
Potassium: 4.8 mmol/L (ref 3.5–5.2)
Sodium: 143 mmol/L (ref 134–144)

## 2017-03-05 NOTE — Telephone Encounter (Signed)
Sent. Thanks.   

## 2017-03-07 DIAGNOSIS — I50814 Right heart failure due to left heart failure: Secondary | ICD-10-CM | POA: Diagnosis not present

## 2017-03-07 DIAGNOSIS — I472 Ventricular tachycardia: Secondary | ICD-10-CM | POA: Diagnosis not present

## 2017-03-07 DIAGNOSIS — I5022 Chronic systolic (congestive) heart failure: Secondary | ICD-10-CM | POA: Diagnosis not present

## 2017-03-07 DIAGNOSIS — I481 Persistent atrial fibrillation: Secondary | ICD-10-CM | POA: Diagnosis not present

## 2017-03-07 DIAGNOSIS — I509 Heart failure, unspecified: Secondary | ICD-10-CM | POA: Diagnosis not present

## 2017-03-07 DIAGNOSIS — I2510 Atherosclerotic heart disease of native coronary artery without angina pectoris: Secondary | ICD-10-CM | POA: Diagnosis not present

## 2017-03-12 DIAGNOSIS — I5022 Chronic systolic (congestive) heart failure: Secondary | ICD-10-CM | POA: Diagnosis not present

## 2017-03-12 DIAGNOSIS — I481 Persistent atrial fibrillation: Secondary | ICD-10-CM | POA: Diagnosis not present

## 2017-03-12 DIAGNOSIS — I509 Heart failure, unspecified: Secondary | ICD-10-CM | POA: Diagnosis not present

## 2017-03-12 DIAGNOSIS — I472 Ventricular tachycardia: Secondary | ICD-10-CM | POA: Diagnosis not present

## 2017-03-12 DIAGNOSIS — I50814 Right heart failure due to left heart failure: Secondary | ICD-10-CM | POA: Diagnosis not present

## 2017-03-12 DIAGNOSIS — I2510 Atherosclerotic heart disease of native coronary artery without angina pectoris: Secondary | ICD-10-CM | POA: Diagnosis not present

## 2017-03-13 ENCOUNTER — Ambulatory Visit (INDEPENDENT_AMBULATORY_CARE_PROVIDER_SITE_OTHER): Payer: Medicare Other | Admitting: Internal Medicine

## 2017-03-13 ENCOUNTER — Telehealth: Payer: Self-pay | Admitting: Internal Medicine

## 2017-03-13 ENCOUNTER — Encounter: Payer: Self-pay | Admitting: Internal Medicine

## 2017-03-13 VITALS — BP 104/70 | HR 59 | Ht 63.0 in | Wt 149.0 lb

## 2017-03-13 DIAGNOSIS — I472 Ventricular tachycardia, unspecified: Secondary | ICD-10-CM

## 2017-03-13 DIAGNOSIS — R079 Chest pain, unspecified: Secondary | ICD-10-CM

## 2017-03-13 NOTE — Telephone Encounter (Signed)
Per Dr. [name], can see Pt today at 2:30 pm.  Spoke with Gunnar Fusi RN for hospice.  Notified that Pt had been contacted, Pt did not answer phone.  Left messages on all phone numbers.  Gunnar Fusi to contact Pt and see if he wants 2:30 pm appt and will confirm with Melissa.  Melissa's number given.

## 2017-03-13 NOTE — Telephone Encounter (Signed)
New Message °

## 2017-03-13 NOTE — Patient Instructions (Addendum)
Medication Instructions:  Your physician recommends that you continue on your current medications as directed. Please refer to the Current Medication list given to you today.  Take nitro for chest pressure.  Labwork: None ordered.  Testing/Procedures: None ordered.  Follow-Up: Your physician wants you to follow-up in: 3 months with Dr. [name].   Any Other Special Instructions Will Be Listed Below (If Applicable).  If you need a refill on your cardiac medications before your next appointment, please call your pharmacy.

## 2017-03-13 NOTE — Progress Notes (Signed)
HPI [name] returns today for an unscheduled visit because of chest pressure and sob and syncope. He was started on Entresto and has tolerated the lowest dose. He has had 2 pauses. He hasnoted some chest pressure and sob and has taken slntg with improvement. No palpitations. No edema. His is concerned about his growing waste. He is very sedentary.  Allergies  Allergen Reactions  . Ace Inhibitors Other (See Comments)    muscle pain. Tolerates ARBs.   . Codeine Other (See Comments)    "head wants to explode."  . Doxycycline Diarrhea and Nausea And Vomiting  . Kionex [Sodium Polystyrene Sulfonate] Other (See Comments)    SOB, pressure in chest, weakness fatigue.   [name] Penicillins Swelling    "started at point of injection; w/in 3 min my upper arm was swollen 3 times normal" Has patient had a PCN reaction causing immediate rash, facial/tongue/throat swelling, SOB or lightheadedness with hypotension: Yes Has patient had a PCN reaction causing severe rash involving mucus membranes or skin necrosis: No Has patient had a PCN reaction that required hospitalization No Has patient had a PCN reaction occurring within the last 10 years: No If all of the above answers are "NO", then may proceed wi  . Lisinopril Other (See Comments)    Muscle Pain  . Statins Other (See Comments)    Myalgias per patient  . Hydrocodone Other (See Comments)    Severe headache, esp when combined with skelaxin.    . Lidocaine     Hallucinations, jerking  . Pacerone [Amiodarone] Other (See Comments)  . Xanax [Alprazolam] Other (See Comments)    Nightmares.       Current Outpatient Medications  Medication Sig Dispense Refill  . acetaminophen (TYLENOL) 325 MG tablet Take 2 tablets (650 mg total) by mouth every 6 (six) hours as needed for mild pain (or Fever >/= 101).    [name] albuterol (PROAIR HFA) 108 (90 Base) MCG/ACT inhaler INHALE 1-2 PUFFS INTO THE LUNGS EVERY 6 (SIX) HOURS AS NEEDED FOR WHEEZING OR SHORTNESS  OF BREATH. 8.5 Inhaler 0  . amiodarone (PACERONE) 200 MG tablet Take 1 tablet (200 mg total) by mouth daily.    [name] apixaban (ELIQUIS) 2.5 MG TABS tablet Take 1 tablet (2.5 mg total) by mouth 2 (two) times daily. 180 tablet 3  . benzonatate (TESSALON) 200 MG capsule TAKE 1 CAPSULE BY MOUTH 3 TIMES DAILY AS NEEDED FOR COUGH 30 capsule 1  . budesonide-formoterol (SYMBICORT) 160-4.5 MCG/ACT inhaler Inhale 2 puffs into the lungs 2 (two) times daily. 1 Inhaler 6  . Chlorpheniramine-APAP (CORICIDIN) 2-325 MG TABS Take 2 tablets by mouth daily as needed (congestion/runny nose).    . ferrous sulfate (FERROUSUL) 325 (65 FE) MG tablet Take 1 tablet (325 mg total) by mouth daily with breakfast.    . Guaifenesin (MUCINEX MAXIMUM STRENGTH) 1200 MG TB12 Take 1,200 mg by mouth 2 (two) times daily.    [name] levalbuterol (XOPENEX) 0.63 MG/3ML nebulizer solution Take 0.63 mg by nebulization every 4 (four) hours as needed for wheezing or shortness of breath.    . loperamide (IMODIUM) 2 MG capsule Take 1 capsule (2 mg total) by mouth See admin instructions. Take 1 capsule (2 mg) by mouth after each bowel movement    . metaxalone (SKELAXIN) 800 MG tablet TAKE 0.5-1 TABLET BY MOUTH 3 TIMES A DAY AS NEEDED FOR MUSCLE SPASMS. 60 tablet 2  . mexiletine (MEXITIL) 150 MG capsule Take 2 capsules (300 mg total)  by mouth every 8 (eight) hours. 8am, 3:30pm, 11:30pm 180 capsule 11  . nitroGLYCERIN (NITROSTAT) 0.4 MG SL tablet Place 1 tablet (0.4 mg total) under the tongue every 5 (five) minutes as needed for chest pain. 30 tablet 3  . OXYGEN Inhale 2 L into the lungs continuous.    . promethazine (PHENERGAN) 25 MG tablet Take 25 mg by mouth every 6 (six) hours as needed for nausea or vomiting.    [name] RANEXA 500 MG 12 hr tablet TAKE 1 TABLET (500 MG TOTAL) BY MOUTH 2 (TWO) TIMES DAILY.  3  . ranitidine (ZANTAC) 150 MG capsule Take 150 mg by mouth daily as needed for heartburn.     . sacubitril-valsartan (ENTRESTO) 24-26 MG Take 1 tablet by  mouth 2 (two) times daily. 60 tablet 11  . Simethicone (GAS-X PO) Take 1-2 capsules by mouth daily as needed (pressure/bloating/gas). As needed.     . traMADol (ULTRAM) 50 MG tablet Take 1 tablet (50 mg total) by mouth every 8 (eight) hours as needed (for pain). 100 tablet 0  . zolpidem (AMBIEN) 10 MG tablet TAKE 0.5-1 TABLETS (5-10 MG TOTAL) BY MOUTH AT BEDTIME AS NEEDED FOR SLEEP 30 tablet 5  . furosemide (LASIX) 40 MG tablet Take 1 tablet (40 mg total) by mouth daily. 90 tablet 3   No current facility-administered medications for this visit.      Past Medical History:  Diagnosis Date  . AICD (automatic cardioverter/defibrillator) present   . Allergic rhinitis, cause unspecified   . Anxiety   . Arthritis    "all over" (11/07/2015)  . Atrial fibrillation (HCC)    a. 05/2015 - converted to sinus in setting of ICD shocks; placed on eliquis 5 bid.  [name] CAD (coronary artery disease)   . Cervical herniated disc    told not to lift >10 lbs  . Chronic systolic CHF (congestive heart failure), NYHA class 2 (HCC)    Reports EF of 25%.   [name] COPD (chronic obstructive pulmonary disease) (HCC) 11/2012   by xray  . Diverticulosis    by CT scan  . HCAP (healthcare-associated pneumonia) 11/06/2015  . History of lower GI bleeding 12/11/2011   "first time" (12/11/2011)  . History of MI (myocardial infarction) 1995   Pt living in [place], no stent, ?PTCA  . Hypertension   . Insomnia   . Ischemic cardiomyopathy    Echo 8/18: EF 20-25, diffuse HK, grade 2 DD, trivial AI, severe central MR, severe LAE, moderately reduced RVSF, moderate RAE, PASP 63  . Perennial allergic rhinitis    only to dust mites  . Pneumonia 2000s   "walking pneumonia"  . VT (ventricular tachycardia) (HCC)    a. 05/2015 - VT storm with multiple ICD shocks-->Amio 400 BID.    ROS:   All systems reviewed and negative except as noted in the HPI.   Past Surgical History:  Procedure Laterality Date  . CARDIAC CATHETERIZATION   2004   LAD 30%, D1 30%, CFX-AV groove 70-80%, OM1 30%, EF 20-25%  . CARDIAC CATHETERIZATION N/A 09/28/2015   Procedure: Left Heart Cath and Coronary Angiography;  Surgeon: Peter M Swaziland, MD;  [facility]: [facility] INVASIVE CV LAB;  Service: Cardiovascular;  Laterality: N/A;  . CARDIAC DEFIBRILLATOR PLACEMENT  2004  . CATARACT EXTRACTION W/ INTRAOCULAR LENS IMPLANT Right 01/2012  . COLONOSCOPY  01/08/2012   Procedure: COLONOSCOPY;  Surgeon: Iva Boop, MD;  Location: [facility] ENDOSCOPY;  Service: Endoscopy;  Laterality: N/A;  . CORONARY ANGIOPLASTY  1995   Pt thinks he got a balloon, living in [place], [place]  . IMPLANTABLE CARDIOVERTER DEFIBRILLATOR GENERATOR CHANGE N/A 02/07/2012   Procedure: IMPLANTABLE CARDIOVERTER DEFIBRILLATOR GENERATOR CHANGE;  Surgeon: Marinus Maw, MD; Medtronic Evera XT VR single-chamber serial number [id number], Laterality: Left  . INSERT / REPLACE / REMOVE PACEMAKER  2004   Medtronic ICD  . KNEE ARTHROSCOPY Left 05/2003   Hattie Perch 06/19/2010  . LAPAROSCOPIC CHOLECYSTECTOMY  1/ 2012  . SHOULDER ARTHROSCOPY W/ ROTATOR CUFF REPAIR Right twice  . TONSILLECTOMY AND ADENOIDECTOMY  ~ 1951  . V TACH ABLATION N/A 04/29/2016   Procedure: V Tach Ablation;  Surgeon: Marinus Maw, MD;  Location: [facility] INVASIVE CV LAB;  Service: Cardiovascular;  Laterality: N/A;     Family History  Problem Relation Age of Onset  . Diabetes Father   . Tracheal cancer Father 38       smoker  . Stroke Mother   . Cancer Sister        left eye  . CAD Neg Hx   . Colon cancer Neg Hx   . Prostate cancer Neg Hx      Social History   Socioeconomic History  . Marital status: Married    Spouse name: Not on file  . Number of children: 0  . Years of education: Not on file  . Highest education level: Not on file  Social Needs  . Financial resource strain: Not on file  . Food insecurity - worry: Not on file  . Food insecurity - inability: Not on file  . Transportation needs - medical: Not on file  .  Transportation needs - non-medical: Not on file  Occupational History  . 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 Smoking status: Former Smoker    Packs/day: 0.50    Years: 50.00    Pack years: 25.00    Types: Cigarettes, Cigars    Last attempt to quit: 09/27/2015    Years since quitting: 1.4  . Smokeless tobacco: Never Used  Substance and Sexual Activity  . Alcohol use: No    Alcohol/week: 0.0 oz  . Drug use: No  . Sexual activity: Not Currently  Other Topics Concern  . Not on file  Social History Narrative   Lives with wife, married 1998   Grown children, 2 great grandchildren   Occupation: retired, was Presenter, broadcasting   Activity: walking, fishing   Diet: good water daily, fruits/vegetables rare      Wife is Product manager.    [id number], Human resources officer. No known agent orange exposure.       Ht 5\' 3"  (1.6 m)   Wt 149 lb (67.6 kg)   BMI 26.39 kg/m   Physical Exam:  Chronically ill appearing 72 yo man, NAD HEENT: Unremarkable Neck:  6 cm JVD, no thyromegally Lymphatics:  No adenopathy Back:  No CVA tenderness Lungs:  Clear with no wheezes HEART:  Regular rate rhythm, no murmurs, no rubs, no clicks Abd:  soft, positive bowel sounds, no organomegally, no rebound, no guarding Ext:  2 plus pulses, no edema, no cyanosis, no clubbing Skin:  No rashes no nodules Neuro:  CN II through XII intact, motor grossly intact  EKG - probable NSR with grouped beating  DEVICE  Normal device function.  See PaceArt for details. No ICD therapies  Assess/Plan: 1. Chest pressure and sob - I have recommended he use sl ntg. I would not have him undergo left heart cath out  of concern of worsening renal dysfunction.  2. VT - his VT has been quite for the past few weeks. He will continue his amio and if stable we will reduce his dose. 3. ICD - interogation of his device demonstrates normal function. 4. Falls - he has not had syncope but his symptoms are still uncontrolled. I have  discussed the importance of avoiding dizzy spells. He is encouraged to eat a little more salt or fluid pills.  Leonia Reeves.D.

## 2017-03-13 NOTE — Telephone Encounter (Addendum)
[name] from community hosp[facility] office called states that pt called her last night because pt  had CP yesterday X 2. [name] states that pt took a NTG for a total of two SL. With relief. Pt states that Dr. [name] had changed his medication. D/C Imdur and start  Entresto. Pt think that the caused of him having CP now. Pt would like to know if Dr [name] want to see him anytime soon.

## 2017-03-19 ENCOUNTER — Other Ambulatory Visit: Payer: Self-pay | Admitting: Family Medicine

## 2017-03-19 NOTE — Telephone Encounter (Signed)
Last office visit 02/07/2017.  Last refilled 09/18/2016 for #30 with 5 refills.  Ok to refill?

## 2017-03-19 NOTE — Telephone Encounter (Signed)
Sent. Thanks.   

## 2017-03-21 DIAGNOSIS — I472 Ventricular tachycardia: Secondary | ICD-10-CM | POA: Diagnosis not present

## 2017-03-21 DIAGNOSIS — I509 Heart failure, unspecified: Secondary | ICD-10-CM | POA: Diagnosis not present

## 2017-03-21 DIAGNOSIS — I50814 Right heart failure due to left heart failure: Secondary | ICD-10-CM | POA: Diagnosis not present

## 2017-03-21 DIAGNOSIS — I5022 Chronic systolic (congestive) heart failure: Secondary | ICD-10-CM | POA: Diagnosis not present

## 2017-03-21 DIAGNOSIS — I2510 Atherosclerotic heart disease of native coronary artery without angina pectoris: Secondary | ICD-10-CM | POA: Diagnosis not present

## 2017-03-21 DIAGNOSIS — I481 Persistent atrial fibrillation: Secondary | ICD-10-CM | POA: Diagnosis not present

## 2017-03-28 DIAGNOSIS — I481 Persistent atrial fibrillation: Secondary | ICD-10-CM | POA: Diagnosis not present

## 2017-03-28 DIAGNOSIS — I2510 Atherosclerotic heart disease of native coronary artery without angina pectoris: Secondary | ICD-10-CM | POA: Diagnosis not present

## 2017-03-28 DIAGNOSIS — I5022 Chronic systolic (congestive) heart failure: Secondary | ICD-10-CM | POA: Diagnosis not present

## 2017-03-28 DIAGNOSIS — I509 Heart failure, unspecified: Secondary | ICD-10-CM | POA: Diagnosis not present

## 2017-03-28 DIAGNOSIS — I472 Ventricular tachycardia: Secondary | ICD-10-CM | POA: Diagnosis not present

## 2017-03-28 DIAGNOSIS — I50814 Right heart failure due to left heart failure: Secondary | ICD-10-CM | POA: Diagnosis not present

## 2017-04-02 ENCOUNTER — Emergency Department (HOSPITAL_COMMUNITY)
Admission: EM | Admit: 2017-04-02 | Discharge: 2017-04-02 | Disposition: A | Discharge status: Home / Self Care | Payer: Medicare Other | Attending: Emergency Medicine | Admitting: Emergency Medicine

## 2017-04-02 ENCOUNTER — Encounter (HOSPITAL_COMMUNITY): Payer: Self-pay | Admitting: Emergency Medicine

## 2017-04-02 ENCOUNTER — Other Ambulatory Visit: Payer: Self-pay | Admitting: Family Medicine

## 2017-04-02 ENCOUNTER — Emergency Department (HOSPITAL_COMMUNITY): Payer: Medicare Other

## 2017-04-02 ENCOUNTER — Other Ambulatory Visit: Payer: Self-pay

## 2017-04-02 DIAGNOSIS — Z87891 Personal history of nicotine dependence: Secondary | ICD-10-CM | POA: Insufficient documentation

## 2017-04-02 DIAGNOSIS — I472 Ventricular tachycardia: Secondary | ICD-10-CM | POA: Diagnosis not present

## 2017-04-02 DIAGNOSIS — Z7901 Long term (current) use of anticoagulants: Secondary | ICD-10-CM | POA: Diagnosis not present

## 2017-04-02 DIAGNOSIS — Y999 Unspecified external cause status: Secondary | ICD-10-CM | POA: Diagnosis not present

## 2017-04-02 DIAGNOSIS — M25511 Pain in right shoulder: Secondary | ICD-10-CM | POA: Diagnosis not present

## 2017-04-02 DIAGNOSIS — I110 Hypertensive heart disease with heart failure: Secondary | ICD-10-CM | POA: Diagnosis not present

## 2017-04-02 DIAGNOSIS — Y939 Activity, unspecified: Secondary | ICD-10-CM | POA: Diagnosis not present

## 2017-04-02 DIAGNOSIS — Z79899 Other long term (current) drug therapy: Secondary | ICD-10-CM | POA: Insufficient documentation

## 2017-04-02 DIAGNOSIS — W108XXA Fall (on) (from) other stairs and steps, initial encounter: Secondary | ICD-10-CM | POA: Insufficient documentation

## 2017-04-02 DIAGNOSIS — J449 Chronic obstructive pulmonary disease, unspecified: Secondary | ICD-10-CM | POA: Insufficient documentation

## 2017-04-02 DIAGNOSIS — S42201A Unspecified fracture of upper end of right humerus, initial encounter for closed fracture: Secondary | ICD-10-CM | POA: Insufficient documentation

## 2017-04-02 DIAGNOSIS — S4981XA Other specified injuries of right shoulder and upper arm, initial encounter: Secondary | ICD-10-CM | POA: Diagnosis present

## 2017-04-02 DIAGNOSIS — I4891 Unspecified atrial fibrillation: Secondary | ICD-10-CM | POA: Diagnosis not present

## 2017-04-02 DIAGNOSIS — Y929 Unspecified place or not applicable: Secondary | ICD-10-CM | POA: Insufficient documentation

## 2017-04-02 DIAGNOSIS — I509 Heart failure, unspecified: Secondary | ICD-10-CM | POA: Diagnosis not present

## 2017-04-02 DIAGNOSIS — I481 Persistent atrial fibrillation: Secondary | ICD-10-CM | POA: Diagnosis not present

## 2017-04-02 DIAGNOSIS — I50814 Right heart failure due to left heart failure: Secondary | ICD-10-CM | POA: Diagnosis not present

## 2017-04-02 DIAGNOSIS — I2510 Atherosclerotic heart disease of native coronary artery without angina pectoris: Secondary | ICD-10-CM | POA: Diagnosis not present

## 2017-04-02 DIAGNOSIS — I5022 Chronic systolic (congestive) heart failure: Secondary | ICD-10-CM | POA: Diagnosis not present

## 2017-04-02 MED ORDER — OXYCODONE-ACETAMINOPHEN 5-325 MG PO TABS: 2.0000 | ORAL_TABLET | ORAL | 0 refills | Status: DC | PRN

## 2017-04-02 MED ORDER — OXYCODONE-ACETAMINOPHEN 5-325 MG PO TABS
1.0000 | ORAL_TABLET | Freq: Once | ORAL | Status: AC
  Administered 2017-04-02: 1 via ORAL
  Filled 2017-04-02: qty 1

## 2017-04-02 NOTE — Discharge Instructions (Signed)
Wear sling until seen by ortho in follow up.  Percocet for pain.

## 2017-04-02 NOTE — Telephone Encounter (Signed)
Needs to come through cards.  Routed for input with appreciation.

## 2017-04-02 NOTE — ED Notes (Signed)
Patient transported to X-ray 

## 2017-04-02 NOTE — Telephone Encounter (Signed)
Electronic refill Last office visit 1/4/1/acute Last refill 11/09/16 Patient sees Dr. Tammy Sours Taylor/cardilogist

## 2017-04-02 NOTE — ED Triage Notes (Signed)
Pt states he lost his balance on 2 steps and hit on concrete floor on his right shoulder c/o 10/10 right shoulder pain, pt denies any LOC, pt is AO x 4, pt is on Eliquis at home denies hitting his head.

## 2017-04-02 NOTE — ED Provider Notes (Signed)
Patient placed in Quick Look pathway, seen and evaluated   Chief Complaint: right shoulder pain  HPI:   72 y.o. male here tonight s/p fall with c/o right shoulder pain. Patient reports that he was going up the steps and reached for the area he usually holds on to and missed it and fell on the concrete on his right shoulder. Patient denies head injury or LOC. Patient did take a nitro because he said the felt light headed.   ROS: M/S: right shoulder pain  Neuro: light headed   Physical Exam:  BP 97/62 (BP Location: Right Arm)   Pulse 60   Temp 97.6 F (36.4 C) (Oral)   Resp 18   Ht 5\' 3"  (1.6 m)   Wt 67.6 kg (149 lb)   SpO2 99%   BMI 26.39 kg/m    Gen: No distress  Neuro: Awake and Alert  Skin: Warm  M/S: tender with palpation of the anterior aspect of  the right shoulder  Focused Exam:    Initiation of care has begun. The patient has been counseled on the process, plan, and necessity for staying for the completion/evaluation, and the remainder of the medical screening examination    Janne Napoleon, NP 04/02/17 2025    Rolland Porter, MD 04/03/17 2223

## 2017-04-02 NOTE — Progress Notes (Signed)
Orthopedic Tech Progress Note Patient Details:  [name] [birth year] [id number]  Ortho Devices Type of Ortho Device: Shoulder immobilizer Ortho Device/Splint Location: RUE Ortho Device/Splint Interventions: Ordered, Application   Post Interventions Patient Tolerated: Well Instructions Provided: Care of device   [name] 04/02/2017, 9:41 PM

## 2017-04-02 NOTE — ED Provider Notes (Signed)
[facility] EMERGENCY DEPARTMENT Provider Note   CSN: [id number] Arrival date & time: 04/02/17  1912     History   Chief Complaint Chief Complaint  Patient presents with  . Shoulder Injury    HPI [name] is a 72 y.o. male.  Chief complaint is fall, shoulder pain.  HPI 72 year old male.  Was walking into his garage.  States he typically holds onto the door jam with his left hand.  His hand slipped.  He fell to his right.  Fell against extended right arm and complains of shoulder pain.  Was at triage.  Was having pain in his shoulder.  Felt a bit lightheaded.  Decided to take a nitroglycerin.  Felt more lightheaded.  Had a transient drop in his blood pressure.  This resolved with laying supine.  He is asymptomatic other than right shoulder pain currently.  Past Medical History:  Diagnosis Date  . AICD (automatic cardioverter/defibrillator) present   . Allergic rhinitis, cause unspecified   . Anxiety   . Arthritis    "all over" (11/07/2015)  . Atrial fibrillation (HCC)    a. 05/2015 - converted to sinus in setting of ICD shocks; placed on eliquis 5 bid.  [name] CAD (coronary artery disease)   . Cervical herniated disc    told not to lift >10 lbs  . Chronic systolic CHF (congestive heart failure), NYHA class 2 (HCC)    Reports EF of 25%.   [name] COPD (chronic obstructive pulmonary disease) (HCC) 11/2012   by xray  . Diverticulosis    by CT scan  . HCAP (healthcare-associated pneumonia) 11/06/2015  . History of lower GI bleeding 12/11/2011   "first time" (12/11/2011)  . History of MI (myocardial infarction) 1995   Pt living in [place], no stent, ?PTCA  . Hypertension   . Insomnia   . Ischemic cardiomyopathy    Echo 8/18: EF 20-25, diffuse HK, grade 2 DD, trivial AI, severe central MR, severe LAE, moderately reduced RVSF, moderate RAE, PASP 63  . Perennial allergic rhinitis    only to dust mites  . Pneumonia 2000s   "walking pneumonia"  . VT (ventricular  tachycardia) (HCC)    a. 05/2015 - VT storm with multiple ICD shocks-->Amio 400 BID.    Patient Active Problem List   Diagnosis Date Noted  . Palliative care encounter   . Neck pain 02/09/2017  . Pneumonia   . Perennial allergic rhinitis   . Hypertension   . Diverticulosis   . Chronic systolic CHF (congestive heart failure), NYHA class 2 (HCC)   . Cervical herniated disc   . CAD (coronary artery disease)   . Atrial fibrillation (HCC)   . AICD (automatic cardioverter/defibrillator) present   . Upper respiratory infection 12/10/2016  . Rib pain 11/17/2016  . DNR (do not resuscitate) 09/21/2016  . Iron deficiency anemia 08/15/2016  . RLS (restless legs syndrome) 08/12/2016  . ICD (implantable cardioverter-defibrillator) discharge 06/20/2016  . Atrial tachycardia (HCC) 06/20/2016  . Ventricular tachycardia (HCC) 06/20/2016  . Hyperkalemia 02/13/2016  . AKI (acute kidney injury) (HCC)   . Centrilobular emphysema (HCC)   . Postcholecystectomy diarrhea 12/12/2015  . Hypoalbuminemia due to protein-calorie malnutrition (HCC)   . Anemia of chronic disease   . Chronic systolic heart failure (HCC)   . Coronary artery disease involving native coronary artery of native heart without angina pectoris   . S/P implantation of automatic cardioverter/defibrillator (AICD)   . Supplemental oxygen dependent   . Physical deconditioning  11/28/2015  . Transaminitis 11/28/2015  . Hypoxia   . HCAP (healthcare-associated pneumonia) 11/06/2015  . Fall at home 10/11/2015  . VT (ventricular tachycardia) (HCC) 09/27/2015  . Memory loss 09/24/2015  . Encounter for screening examination for infectious disease 09/24/2015  . Persistent atrial fibrillation (HCC) 05/28/2015  . Syncope 05/12/2015  . Advance care planning 09/16/2014  . Muscle ache 09/16/2014  . Back pain 09/08/2013  . Abdominal pain, chronic, epigastric 09/08/2013  . Fatigue 06/11/2013  . Chest wall mass 11/05/2012  . COPD (chronic  obstructive pulmonary disease) (HCC) 11/04/2012  . Arthritis   . Insomnia   . Anxiety   . History of lower GI bleeding 12/11/2011  . Ischemic cardiomyopathy 02/12/2011  . Hyperlipidemia 08/25/2008  . Amiodarone pulmonary toxicity 08/25/2008  . History of MI (myocardial infarction) 02/04/1993    Past Surgical History:  Procedure Laterality Date  . CARDIAC CATHETERIZATION  2004   LAD 30%, D1 30%, CFX-AV groove 70-80%, OM1 30%, EF 20-25%  . CARDIAC CATHETERIZATION N/A 09/28/2015   Procedure: Left Heart Cath and Coronary Angiography;  Surgeon: Peter M Swaziland, MD;  Location: [facility] INVASIVE CV LAB;  Service: Cardiovascular;  Laterality: N/A;  . CARDIAC DEFIBRILLATOR PLACEMENT  2004  . CATARACT EXTRACTION W/ INTRAOCULAR LENS IMPLANT Right 01/2012  . COLONOSCOPY  01/08/2012   Procedure: COLONOSCOPY;  Surgeon: Iva Boop, MD;  Location: [facility] ENDOSCOPY;  Service: Endoscopy;  Laterality: N/A;  . CORONARY ANGIOPLASTY  1995   Pt thinks he got a balloon, living in [place], [place]  . IMPLANTABLE CARDIOVERTER DEFIBRILLATOR GENERATOR CHANGE N/A 02/07/2012   Procedure: IMPLANTABLE CARDIOVERTER DEFIBRILLATOR GENERATOR CHANGE;  Surgeon: Marinus Maw, MD; [employer] Evera XT VR single-chamber serial number [id number], Laterality: Left  . INSERT / REPLACE / REMOVE PACEMAKER  2004   [employer] ICD  . KNEE ARTHROSCOPY Left 05/2003   Hattie Perch 06/19/2010  . LAPAROSCOPIC CHOLECYSTECTOMY  1/ 2012  . SHOULDER ARTHROSCOPY W/ ROTATOR CUFF REPAIR Right twice  . TONSILLECTOMY AND ADENOIDECTOMY  ~ 1951  . V TACH ABLATION N/A 04/29/2016   Procedure: V Tach Ablation;  Surgeon: Marinus Maw, MD;  Location: [facility] INVASIVE CV LAB;  Service: Cardiovascular;  Laterality: N/A;       Home Medications    Prior to Admission medications   Medication Sig Start Date End Date Taking? Authorizing Provider  acetaminophen (TYLENOL) 325 MG tablet Take 2 tablets (650 mg total) by mouth every 6 (six) hours as needed for mild pain (or Fever  >/= 101). 12/06/15   Joseph Art, DO  albuterol (PROAIR HFA) 108 (90 Base) MCG/ACT inhaler INHALE 1-2 PUFFS INTO THE LUNGS EVERY 6 (SIX) HOURS AS NEEDED FOR WHEEZING OR SHORTNESS OF BREATH. 10/22/16   Joaquim Nam, MD  amiodarone (PACERONE) 200 MG tablet Take 1 tablet (200 mg total) by mouth daily. 02/14/17   Joaquim Nam, MD  apixaban (ELIQUIS) 2.5 MG TABS tablet Take 1 tablet (2.5 mg total) by mouth 2 (two) times daily. 04/05/16   Marinus Maw, MD  benzonatate (TESSALON) 200 MG capsule TAKE 1 CAPSULE BY MOUTH 3 TIMES DAILY AS NEEDED FOR COUGH 12/10/16   Dianne Dun, MD  budesonide-formoterol [facility]) 160-4.5 MCG/ACT inhaler Inhale 2 puffs into the lungs 2 (two) times daily. 08/22/16   Mannam, Colbert Coyer, MD  Chlorpheniramine-APAP (CORICIDIN) 2-325 MG TABS Take 2 tablets by mouth daily as needed (congestion/runny nose).    [provider]  ferrous sulfate (FERROUSUL) 325 (65 FE) MG tablet Take 1 tablet (  325 mg total) by mouth daily with breakfast. 10/11/16   Joaquim Nam, MD  furosemide (LASIX) 40 MG tablet Take 1 tablet (40 mg total) by mouth daily. 09/24/16 12/23/16  Marinus Maw, MD  Guaifenesin [facility] MAXIMUM STRENGTH) 1200 MG TB12 Take 1,200 mg by mouth 2 (two) times daily.    [provider]  levalbuterol Pauline Aus) 0.63 MG/3ML nebulizer solution Take 0.63 mg by nebulization every 4 (four) hours as needed for wheezing or shortness of breath.    [provider]  loperamide (IMODIUM) 2 MG capsule Take 1 capsule (2 mg total) by mouth See admin instructions. Take 1 capsule (2 mg) by mouth after each bowel movement 09/21/16   Tereso Newcomer T, PA-C  metaxalone (SKELAXIN) 800 MG tablet TAKE 0.5-1 TABLET BY MOUTH 3 TIMES A DAY AS NEEDED FOR MUSCLE SPASMS. 03/05/17   Joaquim Nam, MD  mexiletine (MEXITIL) 150 MG capsule Take 2 capsules (300 mg total) by mouth every 8 (eight) hours. 8am, 3:30pm, 11:30pm 08/27/16   Marinus Maw, MD  nitroGLYCERIN (NITROSTAT)  0.4 MG SL tablet Place 1 tablet (0.4 mg total) under the tongue every 5 (five) minutes as needed for chest pain. 02/18/17   Marinus Maw, MD  oxyCODONE-acetaminophen (PERCOCET/ROXICET) 5-325 MG tablet Take 2 tablets by mouth every 4 (four) hours as needed. 04/02/17   Rolland Porter, MD  OXYGEN Inhale 2 L into the lungs continuous.    [provider]  promethazine (PHENERGAN) 25 MG tablet Take 25 mg by mouth every 6 (six) hours as needed for nausea or vomiting.    [provider]  RANEXA 500 MG 12 hr tablet TAKE 1 TABLET (500 MG TOTAL) BY MOUTH 2 (TWO) TIMES DAILY. 11/09/16   [provider]  ranitidine (ZANTAC) 150 MG capsule Take 150 mg by mouth daily as needed for heartburn.     [provider]  sacubitril-valsartan (ENTRESTO) 24-26 MG Take 1 tablet by mouth 2 (two) times daily. 02/18/17   Marinus Maw, MD  Simethicone (GAS-X PO) Take 1-2 capsules by mouth daily as needed (pressure/bloating/gas). As needed.     [provider]  traMADol (ULTRAM) 50 MG tablet Take 1 tablet (50 mg total) by mouth every 8 (eight) hours as needed (for pain). 11/15/16   Joaquim Nam, MD  zolpidem (AMBIEN) 10 MG tablet TAKE 1/2 TO 1 TABLET BY MOUTH AT BEDTIME AS NEEDED FOR SLEEP 03/19/17   Joaquim Nam, MD    Family History Family History  Problem Relation Age of Onset  . Diabetes Father   . Tracheal cancer Father 34       smoker  . Stroke Mother   . Cancer Sister        left eye  . CAD Neg Hx   . Colon cancer Neg Hx   . Prostate cancer Neg Hx     Social History Social History   Tobacco Use  . Smoking status: Former Smoker    Packs/day: 0.50    Years: 50.00    Pack years: 25.00    Types: Cigarettes, Cigars    Last attempt to quit: 09/27/2015    Years since quitting: 1.5  . Smokeless tobacco: Never Used  Substance Use Topics  . Alcohol use: No    Alcohol/week: 0.0 oz  . Drug use: No     Allergies   Ace inhibitors; Codeine; Doxycycline; Kionex  [sodium polystyrene sulfonate]; Penicillins; Lisinopril; Statins; Hydrocodone; Lidocaine; Pacerone [amiodarone]; and Xanax [alprazolam]  Review of Systems Review of Systems  Constitutional: Negative for appetite change, chills, diaphoresis, fatigue and fever.  HENT: Negative for mouth sores, sore throat and trouble swallowing.   Eyes: Negative for visual disturbance.  Respiratory: Negative for cough, chest tightness, shortness of breath and wheezing.   Cardiovascular: Negative for chest pain.  Gastrointestinal: Negative for abdominal distention, abdominal pain, diarrhea, nausea and vomiting.  Endocrine: Negative for polydipsia, polyphagia and polyuria.  Genitourinary: Negative for dysuria, frequency and hematuria.  Musculoskeletal: Negative for gait problem.       Right shoulder pain.  Skin: Negative for color change, pallor and rash.  Neurological: Negative for dizziness, syncope, light-headedness and headaches.  Hematological: Does not bruise/bleed easily.  Psychiatric/Behavioral: Negative for behavioral problems and confusion.     Physical Exam Updated Vital Signs BP 115/60   Pulse (!) 54   Temp 97.6 F (36.4 C) (Oral)   Resp 18   Ht 5\' 3"  (1.6 m)   Wt 67.6 kg (149 lb)   SpO2 95%   BMI 26.39 kg/m   Physical Exam  Constitutional: He appears well-developed and well-nourished.  HENT:  Head: Normocephalic and atraumatic.  Eyes: Conjunctivae are normal.  Neck: Neck supple.  Cardiovascular: Normal rate and regular rhythm.  No murmur heard. Pulmonary/Chest: Effort normal and breath sounds normal. No respiratory distress.  Abdominal: Soft. There is no tenderness.  Musculoskeletal: He exhibits no edema.  Tenderness and soft tissue swelling.  No early ecchymosis yet at anterior right shoulder.  No palpable dislocation.  Nontender over the clavicle.  Normal axillary nerve distribution sensation.  Normal neurovascular exam distally.  Neurological: He is alert.  Skin: Skin is  warm and dry.  Psychiatric: He has a normal mood and affect.  Nursing note and vitals reviewed.    ED Treatments / Results  Labs (all labs ordered are listed, but only abnormal results are displayed) Labs Reviewed - No data to display  EKG   Radiology Dg Shoulder Right  Result Date: 04/02/2017 CLINICAL DATA:  72 year old male with fall and right shoulder pain. EXAM: RIGHT SHOULDER - 2+ VIEW COMPARISON:  Right shoulder arthrogram dated 12/19/2003 and chest radiograph dated 06/19/2016 FINDINGS: There is a transverse fracture of the right humeral neck with somewhat corticated margins which is new compared to the study of 2018. This fracture is age indeterminate but possibly acute or subacute. Clinical correlation is recommended. There is no dislocation. The bones are osteopenic. There is mild widening of the right AC joint. Mild soft tissue edema lateral to the humeral head and involving the deltoid muscle. IMPRESSION: Nondisplaced age indeterminate fracture of the right humeral neck, likely acute or subacute. Clinical correlation is recommended. Electronically Signed   By: Elgie Collard M.D.   On: 04/02/2017 21:07    EKG: Atrial fibrillation.  Bifascicular block with right bundle branch block.  Unchanged versus morphology.  Resting bradycardia consistent with history.  Procedures Procedures (including critical care time)  Medications Ordered in ED Medications  oxyCODONE-acetaminophen (PERCOCET/ROXICET) 5-325 MG per tablet 1 tablet (1 tablet Oral Given 04/02/17 2109)     Initial Impression / Assessment and Plan / ED Course  I have reviewed the triage vital signs and the nursing notes.  Pertinent labs & imaging results that were available during my care of the patient were reviewed by me and considered in my medical decision making (see chart for details).    EKG Interpretation  Date/Time:  Wednesday April 02 2017 20:20:13 EST Ventricular Rate:  51 PR Interval:  QRS  Duration: 202 QT Interval:  550 QTC Calculation: 506 R Axis:   132 Text Interpretation: Atrial fibrillation.  Bundle branch block pattern./Bifascicular block.  Unchanged.  P.o. Percocet.  Placed in sling.  X-rays show impacted right proximal humerus fracture without displacement or angulation.  Appropriate for discharge and outpatient follow-up with orthopedics.  Percocet for pain.  Final Clinical Impressions(s) / ED Diagnoses   Final diagnoses:  Closed fracture of proximal end of right humerus, unspecified fracture morphology, initial encounter    ED Discharge Orders        Ordered    oxyCODONE-acetaminophen (PERCOCET/ROXICET) 5-325 MG tablet  Every 4 hours PRN     04/02/17 2122       Rolland Porter, MD 04/02/17 2129

## 2017-04-02 NOTE — ED Notes (Signed)
Upon moving pt from triage to fast track room, pt was wobbly while transporting from wheelchair to bed. Pt reported taking a self administered dose of nitro from home medications without making staff aware. Pt stated that he took it because he was feeling light headed, denies chest pain at this time. EDP aware. EKG shot. Acuity upgraded. Pt has a pacemaker.

## 2017-04-03 ENCOUNTER — Ambulatory Visit: Payer: Self-pay | Admitting: *Deleted

## 2017-04-03 ENCOUNTER — Telehealth: Payer: Self-pay | Admitting: Family Medicine

## 2017-04-03 ENCOUNTER — Other Ambulatory Visit: Payer: Self-pay | Admitting: Family Medicine

## 2017-04-03 NOTE — Telephone Encounter (Signed)
Electronic refill request Last refill 11/15/16 #100 Last office visit 02/07/17

## 2017-04-03 NOTE — Telephone Encounter (Signed)
I returned his call pertaining to if he could take Skelaxin with Oxycodone/acetaminophine.   I instructed him to check with his pharmacist.   He stated,  "I'm a walking drug store" so wasn't sure if I could take it together or not.   (He was seen in the ED for a injured shoulder last night).    That way the pharmacist has a list of all your medications and can advise you correctly.   He verbalized understanding and agreed to call his pharmacy.  I let him know to call us back if that did not answer his question.

## 2017-04-03 NOTE — Telephone Encounter (Signed)
See telephone encounter.

## 2017-04-04 DIAGNOSIS — I509 Heart failure, unspecified: Secondary | ICD-10-CM | POA: Diagnosis not present

## 2017-04-04 DIAGNOSIS — I5022 Chronic systolic (congestive) heart failure: Secondary | ICD-10-CM | POA: Diagnosis not present

## 2017-04-04 DIAGNOSIS — I481 Persistent atrial fibrillation: Secondary | ICD-10-CM | POA: Diagnosis not present

## 2017-04-04 DIAGNOSIS — I472 Ventricular tachycardia: Secondary | ICD-10-CM | POA: Diagnosis not present

## 2017-04-04 DIAGNOSIS — I2510 Atherosclerotic heart disease of native coronary artery without angina pectoris: Secondary | ICD-10-CM | POA: Diagnosis not present

## 2017-04-04 DIAGNOSIS — I50814 Right heart failure due to left heart failure: Secondary | ICD-10-CM | POA: Diagnosis not present

## 2017-04-04 NOTE — Telephone Encounter (Signed)
Sent. Thanks.   

## 2017-04-06 ENCOUNTER — Other Ambulatory Visit: Payer: Self-pay | Admitting: Internal Medicine

## 2017-04-08 DIAGNOSIS — S42201A Unspecified fracture of upper end of right humerus, initial encounter for closed fracture: Secondary | ICD-10-CM | POA: Diagnosis not present

## 2017-04-11 DIAGNOSIS — I481 Persistent atrial fibrillation: Secondary | ICD-10-CM | POA: Diagnosis not present

## 2017-04-11 DIAGNOSIS — I50814 Right heart failure due to left heart failure: Secondary | ICD-10-CM | POA: Diagnosis not present

## 2017-04-11 DIAGNOSIS — I5022 Chronic systolic (congestive) heart failure: Secondary | ICD-10-CM | POA: Diagnosis not present

## 2017-04-11 DIAGNOSIS — I2510 Atherosclerotic heart disease of native coronary artery without angina pectoris: Secondary | ICD-10-CM | POA: Diagnosis not present

## 2017-04-11 DIAGNOSIS — I509 Heart failure, unspecified: Secondary | ICD-10-CM | POA: Diagnosis not present

## 2017-04-11 DIAGNOSIS — I472 Ventricular tachycardia: Secondary | ICD-10-CM | POA: Diagnosis not present

## 2017-04-11 NOTE — Telephone Encounter (Signed)
Spoke with Dr [name], pt on lower Eliquis dose due to fall history and lower GI bleed in 2013.

## 2017-04-18 DIAGNOSIS — I5022 Chronic systolic (congestive) heart failure: Secondary | ICD-10-CM | POA: Diagnosis not present

## 2017-04-18 DIAGNOSIS — I472 Ventricular tachycardia: Secondary | ICD-10-CM | POA: Diagnosis not present

## 2017-04-18 DIAGNOSIS — I481 Persistent atrial fibrillation: Secondary | ICD-10-CM | POA: Diagnosis not present

## 2017-04-18 DIAGNOSIS — I50814 Right heart failure due to left heart failure: Secondary | ICD-10-CM | POA: Diagnosis not present

## 2017-04-18 DIAGNOSIS — I509 Heart failure, unspecified: Secondary | ICD-10-CM | POA: Diagnosis not present

## 2017-04-18 DIAGNOSIS — I2510 Atherosclerotic heart disease of native coronary artery without angina pectoris: Secondary | ICD-10-CM | POA: Diagnosis not present

## 2017-04-23 DIAGNOSIS — S42201A Unspecified fracture of upper end of right humerus, initial encounter for closed fracture: Secondary | ICD-10-CM | POA: Diagnosis not present

## 2017-04-25 DIAGNOSIS — I2510 Atherosclerotic heart disease of native coronary artery without angina pectoris: Secondary | ICD-10-CM | POA: Diagnosis not present

## 2017-04-25 DIAGNOSIS — I509 Heart failure, unspecified: Secondary | ICD-10-CM | POA: Diagnosis not present

## 2017-04-25 DIAGNOSIS — I50814 Right heart failure due to left heart failure: Secondary | ICD-10-CM | POA: Diagnosis not present

## 2017-04-25 DIAGNOSIS — I481 Persistent atrial fibrillation: Secondary | ICD-10-CM | POA: Diagnosis not present

## 2017-04-25 DIAGNOSIS — I5022 Chronic systolic (congestive) heart failure: Secondary | ICD-10-CM | POA: Diagnosis not present

## 2017-04-25 DIAGNOSIS — I472 Ventricular tachycardia: Secondary | ICD-10-CM | POA: Diagnosis not present

## 2017-05-02 DIAGNOSIS — I472 Ventricular tachycardia: Secondary | ICD-10-CM | POA: Diagnosis not present

## 2017-05-02 DIAGNOSIS — I50814 Right heart failure due to left heart failure: Secondary | ICD-10-CM | POA: Diagnosis not present

## 2017-05-02 DIAGNOSIS — I5022 Chronic systolic (congestive) heart failure: Secondary | ICD-10-CM | POA: Diagnosis not present

## 2017-05-02 DIAGNOSIS — I509 Heart failure, unspecified: Secondary | ICD-10-CM | POA: Diagnosis not present

## 2017-05-02 DIAGNOSIS — I2510 Atherosclerotic heart disease of native coronary artery without angina pectoris: Secondary | ICD-10-CM | POA: Diagnosis not present

## 2017-05-02 DIAGNOSIS — I481 Persistent atrial fibrillation: Secondary | ICD-10-CM | POA: Diagnosis not present

## 2017-05-05 DIAGNOSIS — I481 Persistent atrial fibrillation: Secondary | ICD-10-CM | POA: Diagnosis not present

## 2017-05-05 DIAGNOSIS — I5022 Chronic systolic (congestive) heart failure: Secondary | ICD-10-CM | POA: Diagnosis not present

## 2017-05-05 DIAGNOSIS — I2510 Atherosclerotic heart disease of native coronary artery without angina pectoris: Secondary | ICD-10-CM | POA: Diagnosis not present

## 2017-05-05 DIAGNOSIS — I509 Heart failure, unspecified: Secondary | ICD-10-CM | POA: Diagnosis not present

## 2017-05-05 DIAGNOSIS — I50814 Right heart failure due to left heart failure: Secondary | ICD-10-CM | POA: Diagnosis not present

## 2017-05-05 DIAGNOSIS — I472 Ventricular tachycardia: Secondary | ICD-10-CM | POA: Diagnosis not present

## 2017-05-09 DIAGNOSIS — I509 Heart failure, unspecified: Secondary | ICD-10-CM | POA: Diagnosis not present

## 2017-05-09 DIAGNOSIS — I481 Persistent atrial fibrillation: Secondary | ICD-10-CM | POA: Diagnosis not present

## 2017-05-09 DIAGNOSIS — I472 Ventricular tachycardia: Secondary | ICD-10-CM | POA: Diagnosis not present

## 2017-05-09 DIAGNOSIS — I2510 Atherosclerotic heart disease of native coronary artery without angina pectoris: Secondary | ICD-10-CM | POA: Diagnosis not present

## 2017-05-09 DIAGNOSIS — I50814 Right heart failure due to left heart failure: Secondary | ICD-10-CM | POA: Diagnosis not present

## 2017-05-09 DIAGNOSIS — I5022 Chronic systolic (congestive) heart failure: Secondary | ICD-10-CM | POA: Diagnosis not present

## 2017-05-12 ENCOUNTER — Telehealth: Payer: Self-pay | Admitting: Internal Medicine

## 2017-05-12 NOTE — Telephone Encounter (Signed)
Call placed to Pt.  Call went directly to VM.  Notified Pt this nurse would attempt to call him tomorrow morning at 7:15 am exactly.

## 2017-05-12 NOTE — Telephone Encounter (Signed)
°  Pt c/o BP issue: STAT if pt c/o blurred vision, one-sided weakness or slurred speech  1. What are your last 5 BP readings?  104/53  2. Are you having any other symptoms (ex. Dizziness, headache, blurred vision, passed out)? Headache blurred vision and pt said he can hardly walk   3. What is your BP issue? low

## 2017-05-12 NOTE — Telephone Encounter (Signed)
New message ° °Pt verbalized that he is returning call for RN °

## 2017-05-12 NOTE — Telephone Encounter (Signed)
Call placed to Pt.  Call went directly to VM.  Left message.

## 2017-05-12 NOTE — Telephone Encounter (Signed)
Attempted to call Pt.  Call went straight to VM.  Notified on message that this nurse could not take calls in clinic.  Left messaging asking Pt to pick up if he saw this number.  Will try again later.

## 2017-05-12 NOTE — Telephone Encounter (Signed)
Mr.Cantin is returning  Your call.  Call back at [phone]

## 2017-05-13 DIAGNOSIS — S42201D Unspecified fracture of upper end of right humerus, subsequent encounter for fracture with routine healing: Secondary | ICD-10-CM | POA: Diagnosis not present

## 2017-05-13 DIAGNOSIS — M542 Cervicalgia: Secondary | ICD-10-CM | POA: Diagnosis not present

## 2017-05-13 NOTE — Telephone Encounter (Signed)
Transmission not received. Called patient to assist- several error codes on monitor. I attempted to troubleshoot the monitor with the [employer]- unsuccessful. He will call [employer] technical support when he has time; he has an appointment with his orthopedic MD this morning. Mr. Harnisch reports that the headaches, blurred vision and trouble walking come and go. He fell twice on Saturday due to low BP. Most recent readings are 104/53 and 105/74 this morning. Hospice nurses haven't been made aware today. He is curious if some adjustment could be made to his medications.

## 2017-05-13 NOTE — Telephone Encounter (Signed)
Left 4th message for Pt.   Requested Pt send a remote monitor to the device clinic for assessment. Then requested Pt call hospice and have nurse come out and assess him.  Then have hospice nurse call this nurse to see if we can make a medication change or if he needs to be seen.

## 2017-05-13 NOTE — Telephone Encounter (Signed)
New message  Pt wife verbalized that she is returning call for RN   Sent message because RN is in clinic  Please return call

## 2017-05-13 NOTE — Telephone Encounter (Signed)
[name] made aware of Dr. Lubertha Basque recommendations to keep a log of time, symptoms and BPs. She is agreeable. They have just gotten home from doctor's appointments and they will call [employer] tech services as soon as they have time. I made her aware that we will keep an eye out for it. She is appreciative.

## 2017-05-13 NOTE — Telephone Encounter (Signed)
Spoke with Pt this morning.  Notified Pt that I had made 4 calls and left messages.  Notified Pt his home phone number goes straight to VM.   Pt describes his symptoms as being weak, dizzy.  When he stands from a sitting position he flops right back down.  Pt wonders if it's because of his BP. Spoke with Dr. [name] and reviewed last couple BP's 104/53, 105/74.  Per Dr. [name]- have Pt start keeping a chart of his BP's and any symptoms he has when they are taken. VM left on wife phone per Pt ok.  Left VM requesting family keep track of BP's and symptoms BID for next few days.  Also requested remote be sent ASAP to see if there is an underlying rhythm causing symptoms.  Left this nurse name and # for call back if any further issues.  Will cont to monitor.

## 2017-05-14 NOTE — Telephone Encounter (Signed)
LVM on pts cell and pts wife phone with direct number to device clinic to call back regarding inability to receive transmission until next week.

## 2017-05-14 NOTE — Telephone Encounter (Signed)
LVM for pt to call back regarding if they had contacted [employer] tech services for help regarding transmission.

## 2017-05-14 NOTE — Telephone Encounter (Signed)
Pt scheduled for defib check for episodes 05/16/17 at 2:00pm with GT in office.

## 2017-05-14 NOTE — Telephone Encounter (Signed)
Follow up   Patient is returning call. He spoke with [employer] but they are not able to get him a new transmission until next week. Please call to discuss.

## 2017-05-15 DIAGNOSIS — I509 Heart failure, unspecified: Secondary | ICD-10-CM | POA: Diagnosis not present

## 2017-05-15 DIAGNOSIS — I472 Ventricular tachycardia: Secondary | ICD-10-CM | POA: Diagnosis not present

## 2017-05-15 DIAGNOSIS — I5022 Chronic systolic (congestive) heart failure: Secondary | ICD-10-CM | POA: Diagnosis not present

## 2017-05-15 DIAGNOSIS — I2510 Atherosclerotic heart disease of native coronary artery without angina pectoris: Secondary | ICD-10-CM | POA: Diagnosis not present

## 2017-05-15 DIAGNOSIS — I481 Persistent atrial fibrillation: Secondary | ICD-10-CM | POA: Diagnosis not present

## 2017-05-15 DIAGNOSIS — I50814 Right heart failure due to left heart failure: Secondary | ICD-10-CM | POA: Diagnosis not present

## 2017-05-16 ENCOUNTER — Ambulatory Visit (INDEPENDENT_AMBULATORY_CARE_PROVIDER_SITE_OTHER): Payer: Self-pay | Admitting: *Deleted

## 2017-05-16 DIAGNOSIS — I255 Ischemic cardiomyopathy: Secondary | ICD-10-CM

## 2017-05-16 LAB — CUP PACEART INCLINIC DEVICE CHECK
Date Time Interrogation Session: 20190412143905
Implantable Lead Implant Date: 20040722
MDC IDC LEAD LOCATION: 753860
MDC IDC PG IMPLANT DT: 20140103

## 2017-05-16 NOTE — Progress Notes (Signed)
Device check in clinic for episode evaluation only - new home ordered.  1 treated episode x 2 ATP and 1 shock 16 VHR - NSVT and VT  Thoracic impedance trends elevated Reviewed with GT - verbal order for increase of lasix to BID x 2 days. ROV with GT 5/23

## 2017-05-16 NOTE — Addendum Note (Signed)
Addended by: Alvina Chou on: 05/16/2017 02:50 PM   Modules accepted: Orders

## 2017-05-18 DIAGNOSIS — I481 Persistent atrial fibrillation: Secondary | ICD-10-CM | POA: Diagnosis not present

## 2017-05-18 DIAGNOSIS — I5022 Chronic systolic (congestive) heart failure: Secondary | ICD-10-CM | POA: Diagnosis not present

## 2017-05-18 DIAGNOSIS — I509 Heart failure, unspecified: Secondary | ICD-10-CM | POA: Diagnosis not present

## 2017-05-18 DIAGNOSIS — I2510 Atherosclerotic heart disease of native coronary artery without angina pectoris: Secondary | ICD-10-CM | POA: Diagnosis not present

## 2017-05-18 DIAGNOSIS — I50814 Right heart failure due to left heart failure: Secondary | ICD-10-CM | POA: Diagnosis not present

## 2017-05-18 DIAGNOSIS — I472 Ventricular tachycardia: Secondary | ICD-10-CM | POA: Diagnosis not present

## 2017-05-19 ENCOUNTER — Encounter (HOSPITAL_COMMUNITY): Payer: Self-pay | Admitting: *Deleted

## 2017-05-19 ENCOUNTER — Emergency Department (HOSPITAL_COMMUNITY): Payer: Medicare Other

## 2017-05-19 ENCOUNTER — Inpatient Hospital Stay (HOSPITAL_COMMUNITY)
Admission: EM | Admit: 2017-05-19 | Discharge: 2017-05-22 | DRG: 308 | Disposition: A | Discharge status: Home / Self Care | Payer: Medicare Other | Attending: Internal Medicine | Admitting: Internal Medicine

## 2017-05-19 ENCOUNTER — Telehealth: Payer: Self-pay | Admitting: Internal Medicine

## 2017-05-19 ENCOUNTER — Other Ambulatory Visit: Payer: Self-pay

## 2017-05-19 DIAGNOSIS — D638 Anemia in other chronic diseases classified elsewhere: Secondary | ICD-10-CM | POA: Diagnosis present

## 2017-05-19 DIAGNOSIS — Z7901 Long term (current) use of anticoagulants: Secondary | ICD-10-CM

## 2017-05-19 DIAGNOSIS — Z7951 Long term (current) use of inhaled steroids: Secondary | ICD-10-CM

## 2017-05-19 DIAGNOSIS — Z95810 Presence of automatic (implantable) cardiac defibrillator: Secondary | ICD-10-CM

## 2017-05-19 DIAGNOSIS — T849XXA Unspecified complication of internal orthopedic prosthetic device, implant and graft, initial encounter: Secondary | ICD-10-CM | POA: Diagnosis not present

## 2017-05-19 DIAGNOSIS — D649 Anemia, unspecified: Secondary | ICD-10-CM

## 2017-05-19 DIAGNOSIS — I481 Persistent atrial fibrillation: Secondary | ICD-10-CM | POA: Diagnosis present

## 2017-05-19 DIAGNOSIS — I340 Nonrheumatic mitral (valve) insufficiency: Secondary | ICD-10-CM | POA: Diagnosis present

## 2017-05-19 DIAGNOSIS — I5023 Acute on chronic systolic (congestive) heart failure: Secondary | ICD-10-CM | POA: Diagnosis present

## 2017-05-19 DIAGNOSIS — K921 Melena: Secondary | ICD-10-CM

## 2017-05-19 DIAGNOSIS — Z79891 Long term (current) use of opiate analgesic: Secondary | ICD-10-CM | POA: Diagnosis not present

## 2017-05-19 DIAGNOSIS — Z880 Allergy status to penicillin: Secondary | ICD-10-CM

## 2017-05-19 DIAGNOSIS — L8990 Pressure ulcer of unspecified site, unspecified stage: Secondary | ICD-10-CM

## 2017-05-19 DIAGNOSIS — F419 Anxiety disorder, unspecified: Secondary | ICD-10-CM | POA: Diagnosis present

## 2017-05-19 DIAGNOSIS — Z833 Family history of diabetes mellitus: Secondary | ICD-10-CM | POA: Diagnosis not present

## 2017-05-19 DIAGNOSIS — I110 Hypertensive heart disease with heart failure: Secondary | ICD-10-CM | POA: Diagnosis present

## 2017-05-19 DIAGNOSIS — I472 Ventricular tachycardia, unspecified: Secondary | ICD-10-CM

## 2017-05-19 DIAGNOSIS — I2510 Atherosclerotic heart disease of native coronary artery without angina pectoris: Secondary | ICD-10-CM | POA: Diagnosis present

## 2017-05-19 DIAGNOSIS — D509 Iron deficiency anemia, unspecified: Secondary | ICD-10-CM | POA: Diagnosis present

## 2017-05-19 DIAGNOSIS — Z823 Family history of stroke: Secondary | ICD-10-CM | POA: Diagnosis not present

## 2017-05-19 DIAGNOSIS — J432 Centrilobular emphysema: Secondary | ICD-10-CM | POA: Diagnosis present

## 2017-05-19 DIAGNOSIS — S4291XD Fracture of right shoulder girdle, part unspecified, subsequent encounter for fracture with routine healing: Secondary | ICD-10-CM | POA: Diagnosis not present

## 2017-05-19 DIAGNOSIS — R001 Bradycardia, unspecified: Secondary | ICD-10-CM | POA: Diagnosis present

## 2017-05-19 DIAGNOSIS — Z888 Allergy status to other drugs, medicaments and biological substances status: Secondary | ICD-10-CM | POA: Diagnosis not present

## 2017-05-19 DIAGNOSIS — Z885 Allergy status to narcotic agent status: Secondary | ICD-10-CM | POA: Diagnosis not present

## 2017-05-19 DIAGNOSIS — G2581 Restless legs syndrome: Secondary | ICD-10-CM | POA: Diagnosis present

## 2017-05-19 DIAGNOSIS — Z801 Family history of malignant neoplasm of trachea, bronchus and lung: Secondary | ICD-10-CM

## 2017-05-19 DIAGNOSIS — W19XXXD Unspecified fall, subsequent encounter: Secondary | ICD-10-CM | POA: Diagnosis present

## 2017-05-19 DIAGNOSIS — M1990 Unspecified osteoarthritis, unspecified site: Secondary | ICD-10-CM | POA: Diagnosis present

## 2017-05-19 DIAGNOSIS — I252 Old myocardial infarction: Secondary | ICD-10-CM

## 2017-05-19 DIAGNOSIS — Z9841 Cataract extraction status, right eye: Secondary | ICD-10-CM

## 2017-05-19 DIAGNOSIS — I255 Ischemic cardiomyopathy: Secondary | ICD-10-CM | POA: Diagnosis present

## 2017-05-19 DIAGNOSIS — Z87891 Personal history of nicotine dependence: Secondary | ICD-10-CM | POA: Diagnosis not present

## 2017-05-19 DIAGNOSIS — Z79899 Other long term (current) drug therapy: Secondary | ICD-10-CM | POA: Diagnosis not present

## 2017-05-19 DIAGNOSIS — G4700 Insomnia, unspecified: Secondary | ICD-10-CM | POA: Diagnosis present

## 2017-05-19 DIAGNOSIS — T82599A Other mechanical complication of unspecified cardiac and vascular devices and implants, initial encounter: Secondary | ICD-10-CM | POA: Diagnosis not present

## 2017-05-19 DIAGNOSIS — I272 Pulmonary hypertension, unspecified: Secondary | ICD-10-CM | POA: Diagnosis present

## 2017-05-19 DIAGNOSIS — I4510 Unspecified right bundle-branch block: Secondary | ICD-10-CM | POA: Diagnosis present

## 2017-05-19 DIAGNOSIS — R079 Chest pain, unspecified: Secondary | ICD-10-CM | POA: Diagnosis not present

## 2017-05-19 DIAGNOSIS — I959 Hypotension, unspecified: Secondary | ICD-10-CM | POA: Diagnosis present

## 2017-05-19 DIAGNOSIS — Z961 Presence of intraocular lens: Secondary | ICD-10-CM | POA: Diagnosis present

## 2017-05-19 DIAGNOSIS — Z9861 Coronary angioplasty status: Secondary | ICD-10-CM

## 2017-05-19 DIAGNOSIS — Z9981 Dependence on supplemental oxygen: Secondary | ICD-10-CM | POA: Diagnosis not present

## 2017-05-19 LAB — BASIC METABOLIC PANEL
Anion gap: 11 (ref 5–15)
BUN: 22 mg/dL — AB (ref 6–20)
CO2: 21 mmol/L — ABNORMAL LOW (ref 22–32)
CREATININE: 1.22 mg/dL (ref 0.61–1.24)
Calcium: 8.1 mg/dL — ABNORMAL LOW (ref 8.9–10.3)
Chloride: 108 mmol/L (ref 101–111)
GFR calc Af Amer: >60 mL/min (ref >60)
GFR, EST NON AFRICAN AMERICAN: 57 mL/min — AB (ref >60)
Glucose, Bld: 97 mg/dL (ref 65–99)
Potassium: 3.8 mmol/L (ref 3.5–5.1)
Sodium: 140 mmol/L (ref 135–145)

## 2017-05-19 LAB — CBC WITH DIFFERENTIAL/PLATELET
BASOS PCT: 0 %
Basophils Absolute: 0 10*3/uL (ref 0.0–0.1)
EOS ABS: 0.3 10*3/uL (ref 0.0–0.7)
Eosinophils Relative: 5 %
HEMATOCRIT: 33.4 % — AB (ref 39.0–52.0)
Hemoglobin: 10.5 g/dL — ABNORMAL LOW (ref 13.0–17.0)
LYMPHS ABS: 1.4 10*3/uL (ref 0.7–4.0)
Lymphocytes Relative: 20 %
MCH: 29.5 pg (ref 26.0–34.0)
MCHC: 31.4 g/dL (ref 30.0–36.0)
MCV: 93.8 fL (ref 78.0–100.0)
MONO ABS: 0.5 10*3/uL (ref 0.1–1.0)
MONOS PCT: 7 %
Neutro Abs: 4.7 10*3/uL (ref 1.7–7.7)
Neutrophils Relative %: 68 %
Platelets: 244 10*3/uL (ref 150–400)
RBC: 3.56 MIL/uL — ABNORMAL LOW (ref 4.22–5.81)
RDW: 15.9 % — AB (ref 11.5–15.5)
WBC: 6.9 10*3/uL (ref 4.0–10.5)

## 2017-05-19 LAB — TYPE AND SCREEN
ABO/RH(D): O POS
ANTIBODY SCREEN: NEGATIVE

## 2017-05-19 LAB — I-STAT TROPONIN, ED: TROPONIN I, POC: 0.02 ng/mL (ref 0.00–0.08)

## 2017-05-19 LAB — CBG MONITORING, ED: Glucose-Capillary: 95 mg/dL (ref 65–99)

## 2017-05-19 LAB — ABO/RH: ABO/RH(D): O POS

## 2017-05-19 LAB — I-STAT CG4 LACTIC ACID, ED: Lactic Acid, Venous: 1 mmol/L (ref 0.5–1.9)

## 2017-05-19 LAB — MAGNESIUM: Magnesium: 1.5 mg/dL — ABNORMAL LOW (ref 1.7–2.4)

## 2017-05-19 IMAGING — CT CT ANGIO CHEST
2 of 6 series · 18 of 36 positions shown · IV contrast (Omni 300)
Comparison: 11/17/2015

CLINICAL DATA: Dyspnea with elevated D-dimer. No history of cancer
or chest pain.

EXAM:
CT ANGIOGRAPHY CHEST WITH CONTRAST
TECHNIQUE: Multidetector CT imaging of the chest was performed using the
standard protocol during bolus administration of intravenous
contrast. Multiplanar CT image reconstructions and MIPs were
obtained to evaluate the vascular anatomy.
CONTRAST:  47 cc of Isovue 370 IV

[Series 6: pe thins · axial · 0.69mm/px · z∈[+1183,+1423]mm · 17 of 270 slices shown]
[im 15/270  lung]
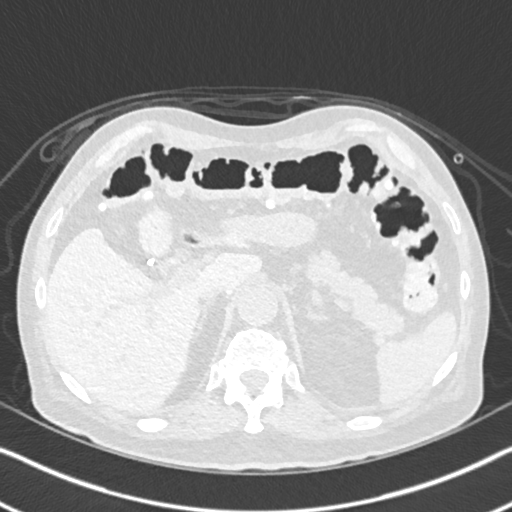
[im 30/270  mediastinal]
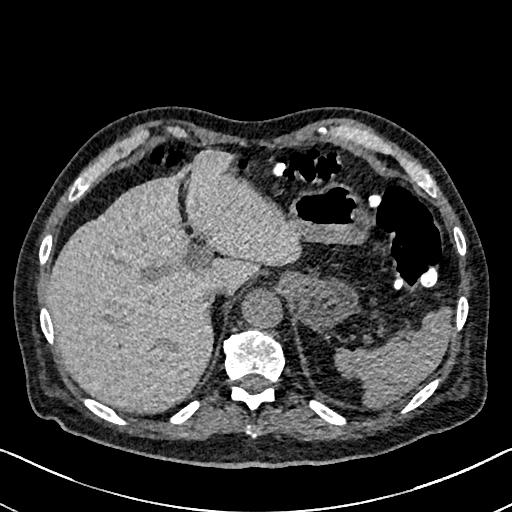
[im 45/270  lung]
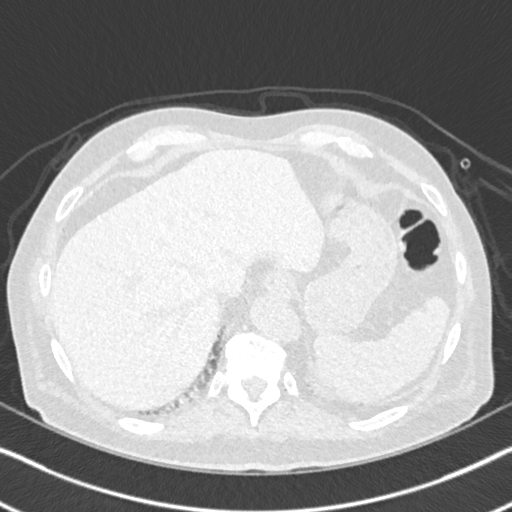
[im 60/270  mediastinal]
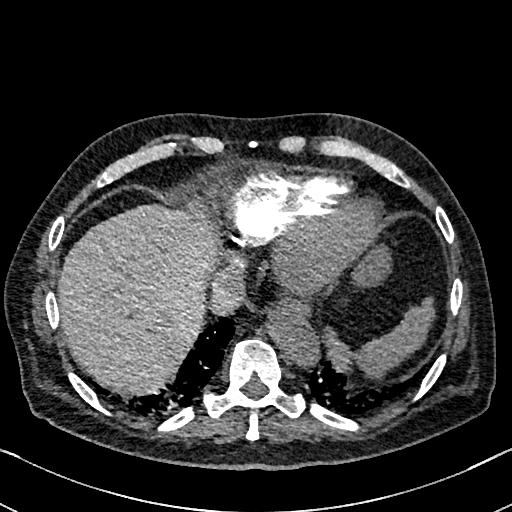
[im 75/270  lung]
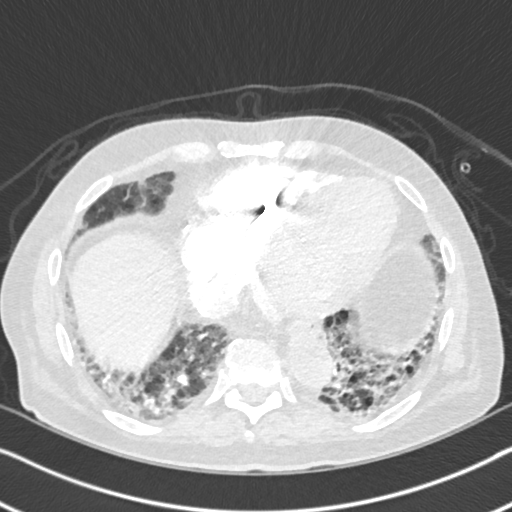
[im 90/270  mediastinal]
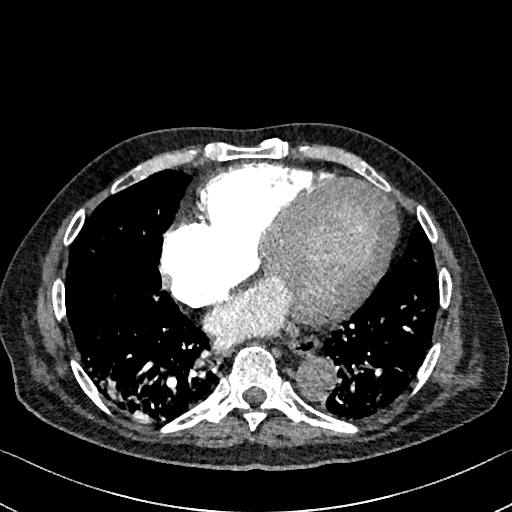
[im 105/270  lung]
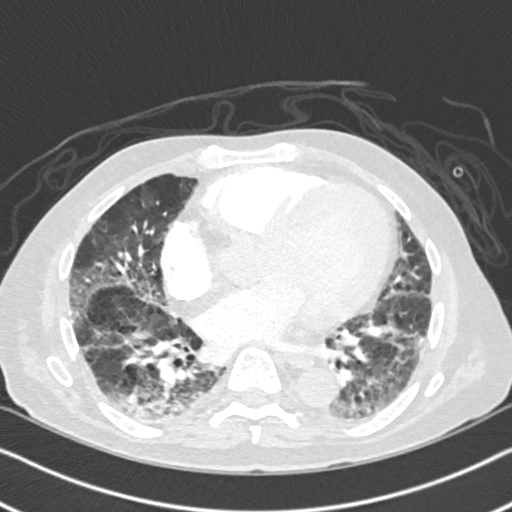
[im 120/270  mediastinal]
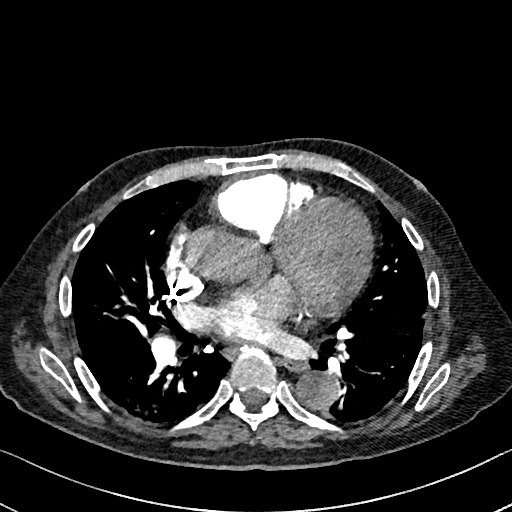
[im 135/270  lung]
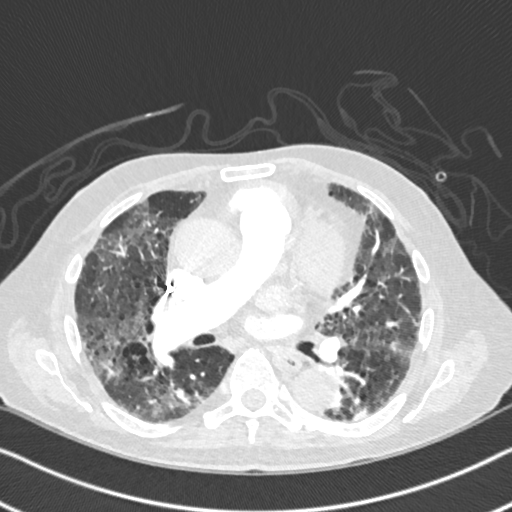
[im 150/270  mediastinal]
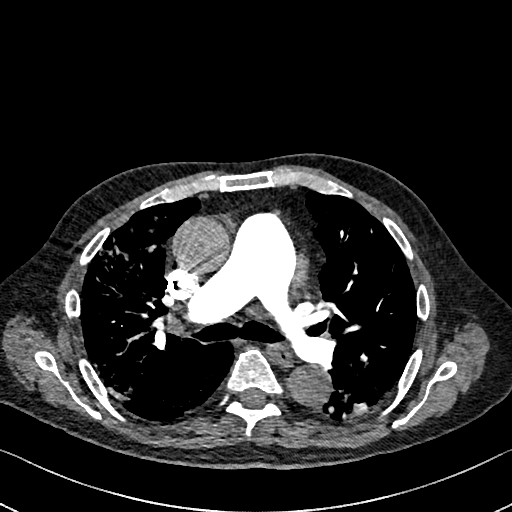
[im 165/270  lung]
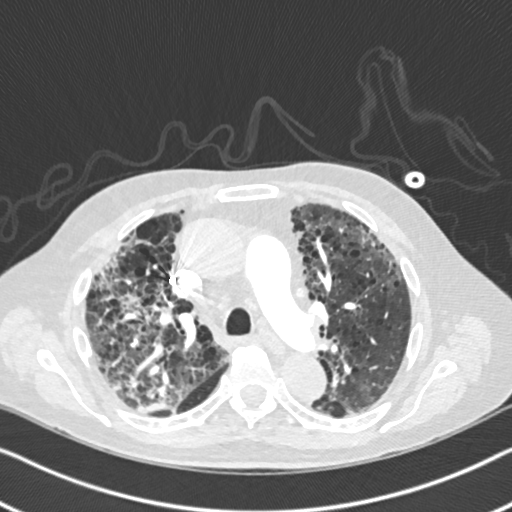
[im 180/270  mediastinal]
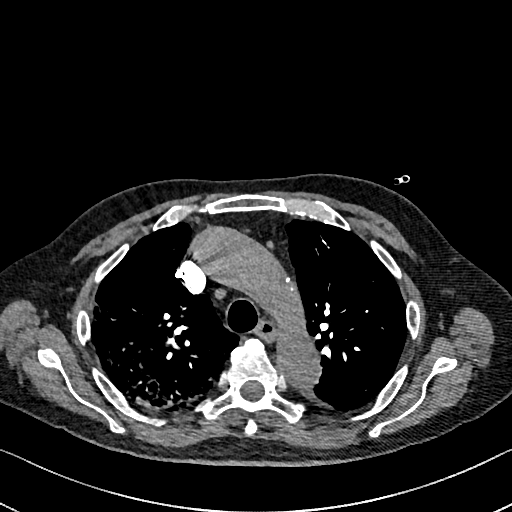
[im 195/270  lung]
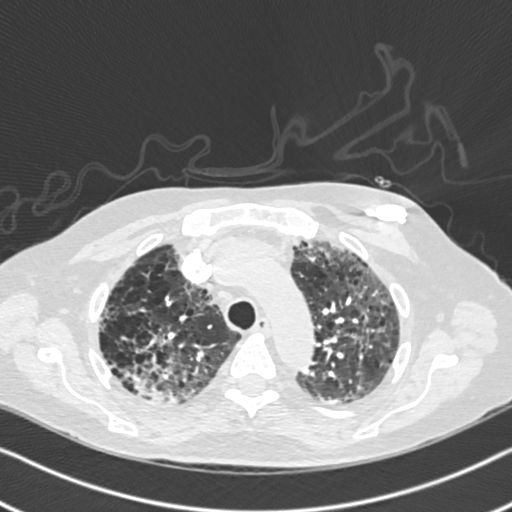
[im 210/270  mediastinal]
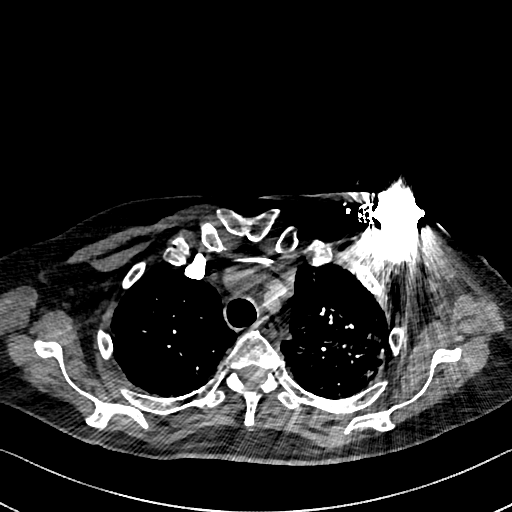
[im 225/270  lung]
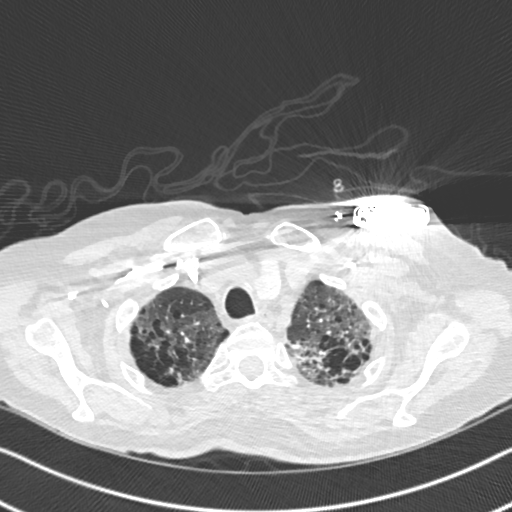
[im 240/270  mediastinal]
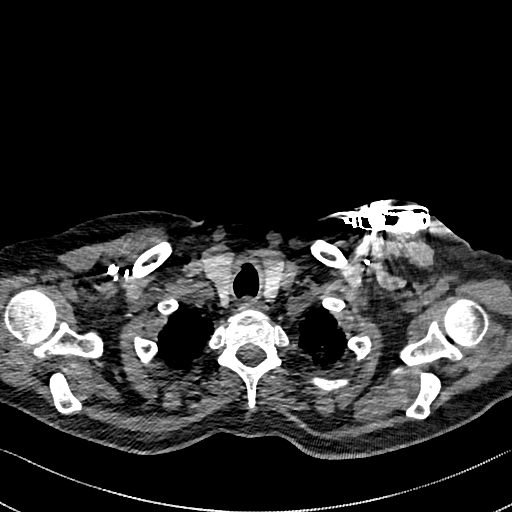
[im 255/270  lung]
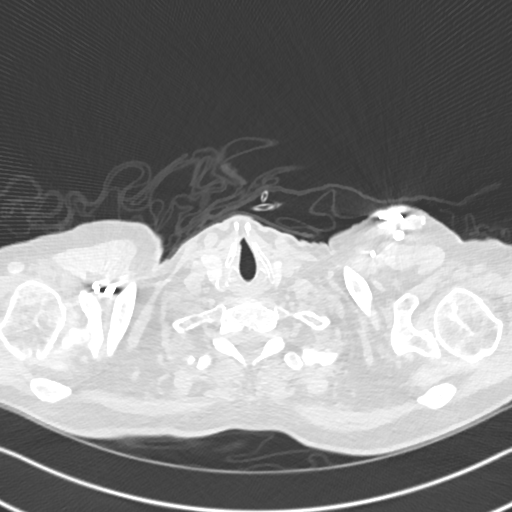

[Series 7: pe 2mm cor · coronal · 0.59mm/px · 1 of 123 slices shown]
[im 62/123  mediastinal]
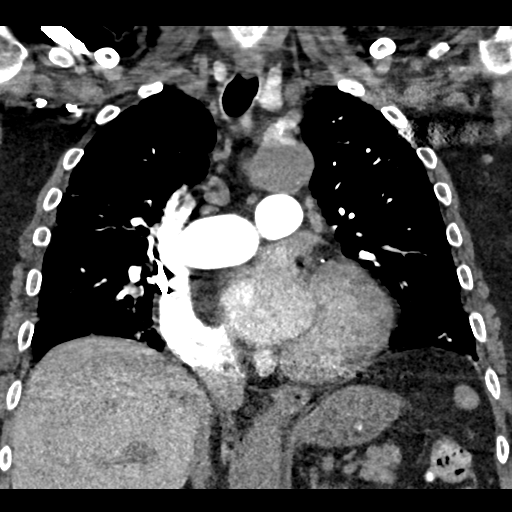

[18 of 36 positions shown; findings below may reference images not displayed]

FINDINGS: Cardiovascular: Satisfactory opacification of the pulmonary arteries
to the segmental level. No evidence of pulmonary embolism. Stable
cardiomegaly without pericardial effusion. LAD and circumflex
coronary arteriosclerosis. Aortic atherosclerosis without aneurysm.

Mediastinum/Nodes: The tracheobronchial tree and esophagus are
unremarkable. Right paratracheal lymph node measuring 15 mm short
axis is currently noted versus 12 mm previously, likely reactive. A
few calcified nodes are seen in the right hilum consistent with old
granulomatous disease. Stable sub cm appearing thyroid nodules as
before.

Lungs/Pleura: There is bilateral centrilobular emphysema with
subpleural blebs. Mild bronchiectasis to the right middle lobe with
superimposed areas of nonspecific ground-glass opacity are noted
bilaterally within all lobes. These may represent areas secondary to
CHF, hypoventilatory change or potentially areas of infection or
inflammation among some considerations though not exclusive. Patchy
airspace opacity seen previously posteriorly in the left lower lobe
is less prominent with more focal consolidation seen in the right
lower lobe posteriorly on current exam. The previously noted
irregular nodule in the right upper lobe seen on prior comparison is
obscured by new ground-glass opacities and emphysematous change.

Upper Abdomen: Normal appearing visualized adrenal glands
bilaterally. Cholecystectomy. No space-occupying mass of the liver.
The visualized pancreas and spleen are unremarkable.

Musculoskeletal: No acute osseous abnormality. Mild degenerative
endplate changes along the upper thoracic spine.

Review of the MIP images confirms the above findings.
IMPRESSION: Insert

1. No acute pulmonary embolus.
2. Bilateral centrilobular emphysema with subpleural blebs, right
middle lobe mild bronchiectasis and nonspecific bilateral
ground-glass opacities of the lungs, possibly sequela of CHF,
pneumonitis/alveolitis and/or hypoventilatory change among some
considerations though not exclusive.
3. 4 mm right upper lobe nodular density is obscured by a new areas
of ground-glass opacity and centrilobular emphysema.

## 2017-05-19 MED ORDER — ALBUTEROL SULFATE (2.5 MG/3ML) 0.083% IN NEBU: 2.5000 mg | INHALATION_SOLUTION | Freq: Four times a day (QID) | RESPIRATORY_TRACT | Status: DC | PRN

## 2017-05-19 MED ORDER — LEVALBUTEROL HCL 0.63 MG/3ML IN NEBU: 0.6300 mg | INHALATION_SOLUTION | RESPIRATORY_TRACT | Status: DC | PRN

## 2017-05-19 MED ORDER — SACUBITRIL-VALSARTAN 24-26 MG PO TABS
1.0000 | ORAL_TABLET | Freq: Two times a day (BID) | ORAL | Status: DC
  Administered 2017-05-20 – 2017-05-22 (×6): 1 via ORAL
  Filled 2017-05-19 (×6): qty 1

## 2017-05-19 MED ORDER — CALCIUM CARBONATE-VITAMIN D 500-200 MG-UNIT PO TABS
1.0000 | ORAL_TABLET | Freq: Every day | ORAL | Status: DC
  Administered 2017-05-20 – 2017-05-22 (×3): 1 via ORAL
  Filled 2017-05-19 (×3): qty 1

## 2017-05-19 MED ORDER — TRAMADOL HCL 50 MG PO TABS: 50.0000 mg | ORAL_TABLET | Freq: Three times a day (TID) | ORAL | Status: DC | PRN

## 2017-05-19 MED ORDER — ACETAMINOPHEN 325 MG PO TABS
650.0000 mg | ORAL_TABLET | ORAL | Status: DC | PRN
  Administered 2017-05-20 – 2017-05-21 (×2): 650 mg via ORAL
  Filled 2017-05-19 (×2): qty 2

## 2017-05-19 MED ORDER — PANTOPRAZOLE SODIUM 40 MG IV SOLR
80.0000 mg | Freq: Once | INTRAVENOUS | Status: AC
  Administered 2017-05-19: 19:00:00 80 mg via INTRAVENOUS
  Filled 2017-05-19: qty 80

## 2017-05-19 MED ORDER — BENZONATATE 100 MG PO CAPS: 200.0000 mg | ORAL_CAPSULE | Freq: Two times a day (BID) | ORAL | Status: DC | PRN

## 2017-05-19 MED ORDER — FAMOTIDINE 20 MG PO TABS
20.0000 mg | ORAL_TABLET | Freq: Every day | ORAL | Status: DC | PRN
  Administered 2017-05-20 – 2017-05-22 (×3): 20 mg via ORAL
  Filled 2017-05-19 (×3): qty 1

## 2017-05-19 MED ORDER — FUROSEMIDE 40 MG PO TABS: 40.0000 mg | ORAL_TABLET | Freq: Every day | ORAL | Status: DC

## 2017-05-19 MED ORDER — MOMETASONE FURO-FORMOTEROL FUM 200-5 MCG/ACT IN AERO
2.0000 | INHALATION_SPRAY | Freq: Two times a day (BID) | RESPIRATORY_TRACT | Status: DC
  Filled 2017-05-19: qty 8.8

## 2017-05-19 MED ORDER — MAGNESIUM SULFATE 2 GM/50ML IV SOLN
2.0000 g | Freq: Once | INTRAVENOUS | Status: AC
  Administered 2017-05-19: 2 g via INTRAVENOUS
  Filled 2017-05-19: qty 50

## 2017-05-19 MED ORDER — AMIODARONE HCL IN DEXTROSE 360-4.14 MG/200ML-% IV SOLN
30.0000 mg/h | INTRAVENOUS | Status: DC
  Administered 2017-05-19 – 2017-05-20 (×4): 30 mg/h via INTRAVENOUS
  Filled 2017-05-19 (×4): qty 200

## 2017-05-19 MED ORDER — AMIODARONE HCL IN DEXTROSE 360-4.14 MG/200ML-% IV SOLN
60.0000 mg/h | INTRAVENOUS | Status: AC
  Administered 2017-05-19: 60 mg/h via INTRAVENOUS
  Filled 2017-05-19 (×2): qty 200

## 2017-05-19 MED ORDER — LOPERAMIDE HCL 2 MG PO CAPS
2.0000 mg | ORAL_CAPSULE | ORAL | Status: DC | PRN
  Administered 2017-05-22: 2 mg via ORAL
  Filled 2017-05-19: qty 1

## 2017-05-19 MED ORDER — FERROUS SULFATE 325 (65 FE) MG PO TABS
325.0000 mg | ORAL_TABLET | Freq: Every day | ORAL | Status: DC
  Administered 2017-05-20 – 2017-05-22 (×3): 325 mg via ORAL
  Filled 2017-05-19 (×3): qty 1

## 2017-05-19 MED ORDER — ONDANSETRON HCL 4 MG/2ML IJ SOLN: 4.0000 mg | Freq: Four times a day (QID) | INTRAMUSCULAR | Status: DC | PRN

## 2017-05-19 MED ORDER — OXYCODONE HCL 5 MG PO TABS
5.0000 mg | ORAL_TABLET | ORAL | Status: DC | PRN
  Administered 2017-05-20 – 2017-05-22 (×6): 5 mg via ORAL
  Filled 2017-05-19 (×6): qty 1

## 2017-05-19 MED ORDER — AMIODARONE LOAD VIA INFUSION
150.0000 mg | Freq: Once | INTRAVENOUS | Status: AC
  Administered 2017-05-19: 150 mg via INTRAVENOUS
  Filled 2017-05-19: qty 83.34

## 2017-05-19 MED ORDER — ZOLPIDEM TARTRATE 5 MG PO TABS
5.0000 mg | ORAL_TABLET | Freq: Every evening | ORAL | Status: DC | PRN
  Administered 2017-05-20 (×2): 5 mg via ORAL
  Filled 2017-05-19 (×2): qty 1

## 2017-05-19 MED ORDER — NITROGLYCERIN 0.4 MG SL SUBL: 0.4000 mg | SUBLINGUAL_TABLET | SUBLINGUAL | Status: DC | PRN

## 2017-05-19 MED ORDER — MEXILETINE HCL 150 MG PO CAPS
300.0000 mg | ORAL_CAPSULE | Freq: Three times a day (TID) | ORAL | Status: DC
  Administered 2017-05-20 – 2017-05-22 (×8): 300 mg via ORAL
  Filled 2017-05-19 (×10): qty 2

## 2017-05-19 MED ORDER — SODIUM CHLORIDE 0.9 % IV BOLUS: 500.0000 mL | Freq: Once | INTRAVENOUS | Status: AC

## 2017-05-19 MED ORDER — SODIUM CHLORIDE 0.9 % IV BOLUS
500.0000 mL | Freq: Once | INTRAVENOUS | Status: AC
  Administered 2017-05-19: 500 mL via INTRAVENOUS

## 2017-05-19 MED ORDER — SODIUM CHLORIDE 0.9 % IV SOLN
8.0000 mg/h | INTRAVENOUS | Status: DC
  Administered 2017-05-19 – 2017-05-20 (×2): 8 mg/h via INTRAVENOUS
  Filled 2017-05-19 (×3): qty 80

## 2017-05-19 MED ORDER — POTASSIUM CHLORIDE CRYS ER 20 MEQ PO TBCR
30.0000 meq | EXTENDED_RELEASE_TABLET | Freq: Once | ORAL | Status: AC
  Administered 2017-05-19: 30 meq via ORAL
  Filled 2017-05-19: qty 1

## 2017-05-19 MED ORDER — GUAIFENESIN ER 600 MG PO TB12
1200.0000 mg | ORAL_TABLET | Freq: Two times a day (BID) | ORAL | Status: DC
Start: 2017-05-19 — End: 2017-05-22
  Administered 2017-05-19 – 2017-05-22 (×6): 1200 mg via ORAL
  Filled 2017-05-19 (×6): qty 2

## 2017-05-19 MED ORDER — RANOLAZINE ER 500 MG PO TB12
500.0000 mg | ORAL_TABLET | Freq: Two times a day (BID) | ORAL | Status: DC
  Administered 2017-05-19 – 2017-05-22 (×6): 500 mg via ORAL
  Filled 2017-05-19 (×6): qty 1

## 2017-05-19 NOTE — Telephone Encounter (Signed)
New message ° °Pt wife verbalized that she is returning call for RN °

## 2017-05-19 NOTE — Telephone Encounter (Signed)
LMTCB//sss 

## 2017-05-19 NOTE — ED Notes (Signed)
Cards at bedside pt ICD fired due to pt going into v-fib

## 2017-05-19 NOTE — H&P (Addendum)
Cardiology Admission History and Physical:   Patient ID: [name]; MRN: [id number]; DOB: [birth year]   Admission date: 05/19/2017  Primary Care Provider: Joaquim Nam, MD Primary Cardiologist/Primary Electrophysiologist:  Dr. [name]  Chief Complaint:  ICD shocks  Patient Profile:   [name] is a 72 y.o. male with a history of of  CAD, mostly NICM w remote T RCA , lengthy hs of VT/VT storms w/ICD, persisitent A Fib, ICM s/p AICD, COPD, anxiety disorder, GIB, multiple hospitalizations for HCAP > ultimately felt to have developed pulm amio toxicity and remains chronically on O2 at home @ 2L.  He has had numerous VT storms, in June 2018, during this hospitalization was decided to resume his amiodarone despite pulm tox 2/2 to persistence of his VT, describved as a last ditch effort to control his VT  History of Present Illness:   Mr. Candelas weas referred to the ER by the office given recurrent ICD shocks.  He last saw Dr. [name] in Feb noting dizzy spell, though VT had been relatively quite for a couple weeks.  On 05/15/17 he was seen in the device clinic after a shock, he was bited to be fluid OL and his lasix increased for 2 days.  He has not had any CP, once with a "peck" pain that he took a s/l NTG for, last week, none otherwise. 05/15/17 event he had syncope with, this AM once he had near syncope.  None otherwise.  He mentions when taking a deeb breath he feels a little lightheaded on occasion.  He denies any bleeding or signs of bleeding, no changes in his stool.  He has had NSVT episodes here and has been ordered already for amio gtt and mag  replacement  [facility] Device interrogation  Numerous NSVT episodes between 4/11-today,  05/15/17: 2 failed ATP, one sucessful shock Yesterday: NSVT, VT monitored, and one event 3 failed ATPs, one successful shock Today: NSVT episodes, 3 treated episodes, in tottal had 4 failed ATPs, 3 successful shocks All shocks  successful  LABS BMET is pending Mag 1.5, replacement already ordered WBC 6.9 H/H 10.5/33.4  (last available is 10/08/16 14.5/45.6) Plts 244 poc Trop 0.02   Device information/history: MDT single chamber ICD, implanted 08/26/02, Dr. [name], VT 11/29/15 started on mexiletine/Ranexa 11/28/15: VT which degenerated into VF with ATP treatment and he received ICD shock 09/27/15: VT episode that he received 2 ATP therapies for and 1 shock, a number of NSVT episodes, and another VT episode he received ATP for that slowed the tachycardia. Morphology appears that he has 2 different VT's VT storm July 2017 VT storm in April 2017 VT w/shocks May 2018 AAD tx:  amiodarone loaded/started July 2017 discontinued 2/2 suspect lung tox >> Mexiletine and Ranexa Hallucinations with lidocaine Feb Quinidine Gluconate stopped ? GI intolerance May 2017 Sotalol added  June 2018 recurrent VT >> sotalol stopped and amiodarone restarted   Past Medical History:  Diagnosis Date  . AICD (automatic cardioverter/defibrillator) present   . Allergic rhinitis, cause unspecified   . Anxiety   . Arthritis    "all over" (11/07/2015)  . Atrial fibrillation (HCC)    a. 05/2015 - converted to sinus in setting of ICD shocks; placed on eliquis 5 bid.  [name] CAD (coronary artery disease)   . Cervical herniated disc    told not to lift >10 lbs  . Chronic systolic CHF (congestive heart failure), NYHA class 2 (HCC)    Reports EF of 25%.   [name]  COPD (chronic obstructive pulmonary disease) (HCC) 11/2012   by xray  . Diverticulosis    by CT scan  . HCAP (healthcare-associated pneumonia) 11/06/2015  . History of lower GI bleeding 12/11/2011   "first time" (12/11/2011)  . History of MI (myocardial infarction) 1995   Pt living in [place], no stent, ?PTCA  . Hypertension   . Insomnia   . Ischemic cardiomyopathy    Echo 8/18: EF 20-25, diffuse HK, grade 2 DD, trivial AI, severe central MR, severe LAE, moderately reduced RVSF,  moderate RAE, PASP 63  . Perennial allergic rhinitis    only to dust mites  . Pneumonia 2000s   "walking pneumonia"  . VT (ventricular tachycardia) (HCC)    a. 05/2015 - VT storm with multiple ICD shocks-->Amio 400 BID.    Past Surgical History:  Procedure Laterality Date  . CARDIAC CATHETERIZATION  2004   LAD 30%, D1 30%, CFX-AV groove 70-80%, OM1 30%, EF 20-25%  . CARDIAC CATHETERIZATION N/A 09/28/2015   Procedure: Left Heart Cath and Coronary Angiography;  Surgeon: Peter M Swaziland, MD;  Location: [facility] INVASIVE CV LAB;  Service: Cardiovascular;  Laterality: N/A;  . CARDIAC DEFIBRILLATOR PLACEMENT  2004  . CATARACT EXTRACTION W/ INTRAOCULAR LENS IMPLANT Right 01/2012  . COLONOSCOPY  01/08/2012   Procedure: COLONOSCOPY;  Surgeon: Iva Boop, MD;  Location: [facility] ENDOSCOPY;  Service: Endoscopy;  Laterality: N/A;  . CORONARY ANGIOPLASTY  1995   Pt thinks he got a balloon, living in [place], [place]  . IMPLANTABLE CARDIOVERTER DEFIBRILLATOR GENERATOR CHANGE N/A 02/07/2012   Procedure: IMPLANTABLE CARDIOVERTER DEFIBRILLATOR GENERATOR CHANGE;  Surgeon: Marinus Maw, MD; Medtronic Evera XT VR single-chamber serial number [id number], Laterality: Left  . INSERT / REPLACE / REMOVE PACEMAKER  2004   Medtronic ICD  . KNEE ARTHROSCOPY Left 05/2003   Hattie Perch 06/19/2010  . LAPAROSCOPIC CHOLECYSTECTOMY  1/ 2012  . SHOULDER ARTHROSCOPY W/ ROTATOR CUFF REPAIR Right twice  . TONSILLECTOMY AND ADENOIDECTOMY  ~ 1951  . V TACH ABLATION N/A 04/29/2016   Procedure: V Tach Ablation;  Surgeon: Marinus Maw, MD;  Location[facility] INVASIVE CV LAB;  Service: Cardiovascular;  Laterality: N/A;     Medications Prior to Admission: Prior to Admission medications   Medication Sig Start Date End Date Taking? Authorizing Provider  acetaminophen (TYLENOL) 325 MG tablet Take 2 tablets (650 mg total) by mouth every 6 (six) hours as needed for mild pain (or Fever >/= 101). 12/06/15  Yes Vann, Jessica U, DO  albuterol (PROAIR HFA)  108 (90 Base) MCG/ACT inhaler INHALE 1-2 PUFFS INTO THE LUNGS EVERY 6 (SIX) HOURS AS NEEDED FOR WHEEZING OR SHORTNESS OF BREATH. 10/22/16  Yes Joaquim Nam, MD  amiodarone (PACERONE) 200 MG tablet Take 1 tablet (200 mg total) by mouth daily. 02/14/17  Yes Joaquim Nam, MD  benzonatate (TESSALON) 200 MG capsule TAKE 1 CAPSULE BY MOUTH 3 TIMES DAILY AS NEEDED FOR COUGH 12/10/16  Yes Dianne Dun, MD  ELIQUIS 2.5 MG TABS tablet TAKE 1 TABLET (2.5 MG TOTAL) BY MOUTH 2 (TWO) TIMES DAILY. 04/11/17  Yes Marinus Maw, MD  ferrous sulfate (FERROUSUL) 325 (65 FE) MG tablet Take 1 tablet (325 mg total) by mouth daily with breakfast. 10/11/16  Yes Joaquim Nam, MD  furosemide (LASIX) 40 MG tablet Take 1 tablet (40 mg total) by mouth daily. 09/24/16 05/19/17 Yes Marinus Maw, MD  Guaifenesin [facility] MAXIMUM STRENGTH) 1200 MG TB12 Take 1,200 mg by mouth 2 (two) times  daily.   Yes [provider]  levalbuterol (XOPENEX) 0.63 MG/3ML nebulizer solution Take 0.63 mg by nebulization every 4 (four) hours as needed for wheezing or shortness of breath.   Yes [provider]  loperamide (IMODIUM) 2 MG capsule Take 1 capsule (2 mg total) by mouth See admin instructions. Take 1 capsule (2 mg) by mouth after each bowel movement Patient taking differently: Take 2 mg by mouth See admin instructions. Take 1 capsule (2 mg) by mouth after each bowel movement as needed 09/21/16  Yes Weaver, Scott T, PA-C  metaxalone (SKELAXIN) 800 MG tablet TAKE 0.5-1 TABLET BY MOUTH 3 TIMES A DAY AS NEEDED FOR MUSCLE SPASMS. Patient taking differently: TAKE 400 mg to 800 mg  TABLET BY MOUTH 3 TIMES A DAY AS NEEDED FOR MUSCLE SPASMS. 03/05/17  Yes Joaquim Nam, MD  mexiletine (MEXITIL) 150 MG capsule Take 2 capsules (300 mg total) by mouth every 8 (eight) hours. 8am, 3:30pm, 11:30pm 08/27/16  Yes Marinus Maw, MD  nitroGLYCERIN (NITROSTAT) 0.4 MG SL tablet Place 1 tablet (0.4 mg total) under the tongue every 5 (five)  minutes as needed for chest pain. 02/18/17  Yes Marinus Maw, MD  oxyCODONE-acetaminophen (PERCOCET/ROXICET) 5-325 MG tablet Take 2 tablets by mouth every 4 (four) hours as needed. 04/02/17  Yes Rolland Porter, MD  OXYGEN Inhale 2 L into the lungs continuous.   Yes [provider]  promethazine (PHENERGAN) 25 MG tablet Take 25 mg by mouth every 6 (six) hours as needed for nausea or vomiting.   Yes [provider]  RANEXA 500 MG 12 hr tablet TAKE 1 TABLET (500 MG TOTAL) BY MOUTH 2 (TWO) TIMES DAILY. 04/08/17  Yes Marinus Maw, MD  sacubitril-valsartan (ENTRESTO) 24-26 MG Take 1 tablet by mouth 2 (two) times daily. 02/18/17  Yes Marinus Maw, MD  Simethicone (GAS-X PO) Take 1-2 capsules by mouth daily as needed (pressure/bloating/gas). As needed.    Yes [provider]  traMADol (ULTRAM) 50 MG tablet TAKE 1 TABLET BY MOUTH 3 TIMES DAILY AS NEEDED Patient taking differently: TAKE 50 mg TABLET BY MOUTH 3 TIMES DAILY AS NEEDED 04/04/17  Yes Joaquim Nam, MD  zolpidem (AMBIEN) 10 MG tablet TAKE 1/2 TO 1 TABLET BY MOUTH AT BEDTIME AS NEEDED FOR SLEEP Patient taking differently: TAKE 5 mg  TO 10 mg  TABLET BY MOUTH AT BEDTIME AS NEEDED FOR SLEEP 03/19/17  Yes Joaquim Nam, MD  budesonide-formoterol [facility]) 160-4.5 MCG/ACT inhaler Inhale 2 puffs into the lungs 2 (two) times daily. 08/22/16   Mannam, Colbert Coyer, MD  Chlorpheniramine-APAP (CORICIDIN) 2-325 MG TABS Take 2 tablets by mouth daily as needed (congestion/runny nose).    [provider]  ranitidine (ZANTAC) 150 MG capsule Take 150 mg by mouth daily as needed for heartburn.     [provider]     Allergies:    Allergies  Allergen Reactions  . Ace Inhibitors Other (See Comments)    muscle pain. Tolerates ARBs.   . Codeine Other (See Comments)    "head wants to explode."  . Doxycycline Diarrhea and Nausea And Vomiting  . Kionex [Sodium Polystyrene Sulfonate] Other (See Comments)    SOB,  pressure in chest, weakness fatigue.   [name] Penicillins Swelling    "started at point of injection; w/in 3 min my upper arm was swollen 3 times normal" Has patient had a PCN reaction causing immediate rash, facial/tongue/throat swelling, SOB or lightheadedness with hypotension: Yes Has patient  had a PCN reaction causing severe rash involving mucus membranes or skin necrosis: No Has patient had a PCN reaction that required hospitalization No Has patient had a PCN reaction occurring within the last 10 years: No If all of the above answers are "NO", then may proceed wi  . Lisinopril Other (See Comments)    Muscle Pain  . Statins Other (See Comments)    Myalgias per patient  . Hydrocodone Other (See Comments)    Severe headache, esp when combined with skelaxin.    . Lidocaine     Hallucinations, jerking  . Pacerone [Amiodarone] Other (See Comments)  . Xanax [Alprazolam] Other (See Comments)    Nightmares.      Social History:   Social History   Socioeconomic History  . Marital status: Married    Spouse name: Not on file  . Number of children: 0  . Years of education: Not on file  . Highest education level: Not on file  Occupational History  . 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 Financial resource strain: Not on file  . Food insecurity:    Worry: Not on file    Inability: Not on file  . Transportation needs:    Medical: Not on file    Non-medical: Not on file  Tobacco Use  . Smoking status: Former Smoker    Packs/day: 0.50    Years: 50.00    Pack years: 25.00    Types: Cigarettes, Cigars    Last attempt to quit: 09/27/2015    Years since quitting: 1.6  . Smokeless tobacco: Never Used  Substance and Sexual Activity  . Alcohol use: No    Alcohol/week: 0.0 oz  . Drug use: No  . Sexual activity: Not Currently  Lifestyle  . Physical activity:    Days per week: Not on file    Minutes per session: Not on file  . Stress: Not on file  Relationships  .  Social connections:    Talks on phone: Not on file    Gets together: Not on file    Attends religious service: Not on file    Active member of club or organization: Not on file    Attends meetings of clubs or organizations: Not on file    Relationship status: Not on file  . Intimate partner violence:    Fear of current or ex partner: Not on file    Emotionally abused: Not on file    Physically abused: Not on file    Forced sexual activity: Not on file  Other Topics Concern  . Not on file  Social History Narrative   Lives with wife, married 1998   Grown children, 2 great grandchildren   Occupation: retired, was Presenter, broadcasting   Activity: walking, fishing   Diet: good water daily, fruits/vegetables rare      Wife is Product manager.    [id number], Human resources officer. No known agent orange exposure.     Family History:   The patient's family history includes Cancer in his sister; Diabetes in his father; Stroke in his mother; Tracheal cancer (age of onset: 22) in his father. There is no history of CAD, Colon cancer, or Prostate cancer.    ROS:  Please see the history of present illness.  All other ROS reviewed and negative.     Physical Exam/Data:   Vitals:   05/19/17 1452 05/19/17 1453 05/19/17 1515 05/19/17 1600  BP: (!) 82/59  106/71 (!) 89/57  Pulse:  67  74 75  Resp: 20  16 19   Temp: 97.7 F (36.5 C)     TempSrc: Oral     SpO2: 98%  99% 95%  Weight:  149 lb (67.6 kg)    Height:  5\' 3"  (1.6 m)     No intake or output data in the 24 hours ending 05/19/17 1631 Filed Weights   05/19/17 1453  Weight: 149 lb (67.6 kg)   Body mass index is 26.39 kg/m.  General:  Well nourished, well developed, in no acute distress, appears chronically ill HEENT: normal Lymph: no adenopathy Neck: no JVD Endocrine:  No thryomegaly Vascular: No carotid bruits Cardiac:  RRR; 1/6 SM, no gallops or rubs appreciated Lungs:  CTA b/l, no wheezing, rhonchi or rales  Abd: soft, nontender, no hepatomegaly   Ext: no edema Musculoskeletal:  Shoulder immobilizer R, advanced atrophy Skin: warm and dry  Neuro:   no focal abnormalities noted Psych:  Normal affect    EKG:  The ECG that was done was personally reviewed and demonstrates SR, 1st degree AVblock, RBBB, QRS , appears similar to prior Telemetry, looks SR, has had NSVT episodes, once 25beats  Relevant CV Studies:  09/18/17: TTE Study Conclusions - Left ventricle: The cavity size was normal. Wall thickness was   normal. Systolic function was severely reduced. The estimated   ejection fraction was in the range of 20% to 25%. Diffuse   hypokinesis with the septal wall being most preserved. Features   are consistent with a pseudonormal left ventricular filling   pattern, with concomitant abnormal relaxation and increased   filling pressure (grade 2 diastolic dysfunction). - Aortic valve: There was no stenosis. There was trivial   regurgitation. - Mitral valve: There was severe central regurgitation. - Left atrium: The atrium was severely dilated. - Right ventricle: The cavity size was mildly dilated. Pacer wire   or catheter noted in right ventricle. Systolic function was   moderately reduced. - Right atrium: The atrium was moderately dilated. - Tricuspid valve: Peak RV-RA gradient (S): 48 mm Hg. - Pulmonary arteries: PA peak pressure: 63 mm Hg (S). - Systemic veins: IVC measured 2.3 cm with < 50% respirophasic   variation, suggesting RA pressure 15 mmHg. Impressions - Normal LV size with EF 20-25%, diffuse hypokinesis. Moderate   diastolic dysfunction. Mildly dilated RV with moderately   decreased systolic function. Severe LAE, moderate RAE. Severe   mitral regurgitation centrally directed, likely functional.   Moderate pulmonary hypertension.  LHC 09/28/15  Prox RCA to Mid RCA lesion, 100 %stenosed.  Ost LM to LM lesion, 30 %stenosed.  Mid Cx lesion, 70 %stenosed.  Ost 3rd Mrg to 3rd Mrg lesion, 75  %stenosed.  Prox LAD lesion, 35 %stenosed.  There is severe left ventricular systolic dysfunction.  LV end diastolic pressure is severely elevated.  The left ventricular ejection fraction is less than 25% by visual estimate.  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    Laboratory Data:  ChemistryNo results for input(s): NA, K, CL, CO2, GLUCOSE, BUN, CREATININE, CALCIUM, GFRNONAA, GFRAA, ANIONGAP in the last 168 hours.  No results for input(s): PROT, ALBUMIN, AST, ALT, ALKPHOS, BILITOT in the last 168 hours. Hematology Recent Labs  Lab 05/19/17 1540  WBC 6.9  RBC 3.56*  HGB 10.5*  HCT 33.4*  MCV 93.8  MCH 29.5  MCHC 31.4  RDW 15.9*  PLT 244   Cardiac EnzymesNo results for input(s): TROPONINI in the last 168 hours.  Recent Labs  Lab 05/19/17 1547  TROPIPOC 0.02    BNPNo results for input(s): BNP, PROBNP in the last 168 hours.  DDimer No results for input(s): DDIMER in the last 168 hours.  Radiology/Studies:  Dg Chest Port 1 View Result Date: 05/19/2017 CLINICAL DATA:  Chest pain.  Defibrillator fired. EXAM: PORTABLE CHEST 1 VIEW COMPARISON:  11/15/2016 FINDINGS: A single lead ICD remains in place. The cardiac silhouette remains enlarged. The patient has taken a shallower inspiration than on the prior study, and lung volumes are low. Chronic prominence of the interstitial markings is unchanged. No airspace consolidation, overt edema, sizable pleural effusion, or pneumothorax is identified. A proximal right humerus fracture is noted (present on 04/02/2017 shoulder radiographs). IMPRESSION: Cardiomegaly and chronic lung changes without evidence of acute abnormality. Electronically Signed   By: Sebastian Ache M.D.   On: 05/19/2017 15:34    Assessment and Plan:   1. VT storm, long hx of refractory VT     Admit to ICU     Agree with amiodarone gtt and mag replacement     BMET pending, keep K+ at least 4.0  Discussed with patient, wants full code status  Pt with  recurrent VT, MMVT and PMVT >> shocked x2 in ER via his device successful Unfortunately a delay with a number of emergencies, amio ordered earlier not yet started, getting bolus going now with the mag  Dr. [name] has seen the patient  2. ICM     He does not appear overtly OL by exam     corVue suggests fluid OL, cxr does not     Home regime for now  3. CAD     No anginal sounding c/o     Continue home meds  4. Persistent AFib     Out pt on Eliquis     protonix gtt ordered in ER, H/H down from last done several months ago     No reports of noted bleeding/signs of bleeding y pt     He is in SR, will hold his Eliquis tonight and see H/H in AM  5. COPD,      Pul amio tox     Om 2l n/c home O2, continue here with his resp tx    Severity of Illness: Inpatient/ICU  " The patient's presenting symptoms include VT storm. " The physical exam findings include recurrent VT.      For questions or updates, please contact [facility] Please consult [email or web address] for contact info under Cardiology/STEMI.    Signed, Sheilah Pigeon, PA-C  05/19/2017 4:31 PM    EP attending  Patient seen and examined.  Agree with the findings as noted above.  After a period of several months of stability, the patient read presents today with ventricular tachycardia storm.  While in the emergency department, the patient developed ventricular tachycardia, received multiple rounds of anti-tachycardic pacing before developing ventricular fibrillation and for which she was successfully defibrillated through his ICD.  He has had 4 similar episodes in the past couple of days.  His heart failure symptoms have been minimally worsened, although he has had increase in his fluid index on his ICD.  He denies medical or dietary noncompliance.  His exam demonstrates a frail and ill-appearing man who is in mild respiratory distress.  His extremities are warm.  Neurologically he is normal when he is mentating and has a  normal blood pressure.  The patient will be admitted to the hospital and placed on intravenous amiodarone along with  intravenous diuretic therapy.  His long-term prognosis is very poor.  We will ask the hospice service to see him and consider turning off his tachy therapies.  Lewayne Bunting, MD

## 2017-05-19 NOTE — Telephone Encounter (Signed)
Patient/wife returned call and stated that patient has been shocked 2 more times since his initial call from this am. Patient denies any sx's of CP, dizziness, or ShOB at the moment. I informed patient that per protocol he needs to be evaluated in the ER. I told wife that it is my recommendation that she not drive him to the hospital herself. Wife verbalized understanding. EP APP notified.

## 2017-05-19 NOTE — Telephone Encounter (Signed)
New Message  Pt states that his defib shocked again last night, says its the second or third one he has had in 3 days and he would like to speak with the nurse. Please call   1. Has your device fired? yes  2. Is you device beeping? no  3. Are you experiencing draining or swelling at device site? no  4. Are you calling to see if we received your device transmission? no  5. Have you passed out? no    Please route to Device Clinic Pool

## 2017-05-19 NOTE — ED Provider Notes (Signed)
[facility] EMERGENCY DEPARTMENT Provider Note   CSN: [id number] Arrival date & time: 05/19/17  1437     History   Chief Complaint Chief Complaint  Patient presents with  . defib fired x 3    HPI [name] is a 72 y.o. male.  The history is provided by the patient.  Illness  This is a new problem. The current episode started yesterday. The problem has been resolved. Associated symptoms include chest pain. Pertinent negatives include no abdominal pain, no headaches and no shortness of breath. Nothing aggravates the symptoms. Nothing relieves the symptoms. He has tried nothing for the symptoms. The treatment provided mild relief.    Past Medical History:  Diagnosis Date  . AICD (automatic cardioverter/defibrillator) present   . Allergic rhinitis, cause unspecified   . Anxiety   . Arthritis    "all over" (11/07/2015)  . Atrial fibrillation (HCC)    a. 05/2015 - converted to sinus in setting of ICD shocks; placed on eliquis 5 bid.  [name] CAD (coronary artery disease)   . Cervical herniated disc    told not to lift >10 lbs  . Chronic systolic CHF (congestive heart failure), NYHA class 2 (HCC)    Reports EF of 25%.   [name] COPD (chronic obstructive pulmonary disease) (HCC) 11/2012   by xray  . Diverticulosis    by CT scan  . HCAP (healthcare-associated pneumonia) 11/06/2015  . History of lower GI bleeding 12/11/2011   "first time" (12/11/2011)  . History of MI (myocardial infarction) 1995   Pt living in [place], no stent, ?PTCA  . Hypertension   . Insomnia   . Ischemic cardiomyopathy    Echo 8/18: EF 20-25, diffuse HK, grade 2 DD, trivial AI, severe central MR, severe LAE, moderately reduced RVSF, moderate RAE, PASP 63  . Perennial allergic rhinitis    only to dust mites  . Pneumonia 2000s   "walking pneumonia"  . VT (ventricular tachycardia) (HCC)    a. 05/2015 - VT storm with multiple ICD shocks-->Amio 400 BID.    Patient Active Problem List   Diagnosis Date Noted  . Palliative care encounter   . Neck pain 02/09/2017  . Pneumonia   . Perennial allergic rhinitis   . Hypertension   . Diverticulosis   . Chronic systolic CHF (congestive heart failure), NYHA class 2 (HCC)   . Cervical herniated disc   . CAD (coronary artery disease)   . Atrial fibrillation (HCC)   . AICD (automatic cardioverter/defibrillator) present   . Upper respiratory infection 12/10/2016  . Rib pain 11/17/2016  . DNR (do not resuscitate) 09/21/2016  . Iron deficiency anemia 08/15/2016  . RLS (restless legs syndrome) 08/12/2016  . ICD (implantable cardioverter-defibrillator) discharge 06/20/2016  . Atrial tachycardia (HCC) 06/20/2016  . Ventricular tachycardia (HCC) 06/20/2016  . Hyperkalemia 02/13/2016  . AKI (acute kidney injury) (HCC)   . Centrilobular emphysema (HCC)   . Postcholecystectomy diarrhea 12/12/2015  . Hypoalbuminemia due to protein-calorie malnutrition (HCC)   . Anemia of chronic disease   . Chronic systolic heart failure (HCC)   . Coronary artery disease involving native coronary artery of native heart without angina pectoris   . S/P implantation of automatic cardioverter/defibrillator (AICD)   . Supplemental oxygen dependent   . Physical deconditioning 11/28/2015  . Transaminitis 11/28/2015  . Hypoxia   . HCAP (healthcare-associated pneumonia) 11/06/2015  . Fall at home 10/11/2015  . VT (ventricular tachycardia) (HCC) 09/27/2015  . Memory loss 09/24/2015  .  Encounter for screening examination for infectious disease 09/24/2015  . Persistent atrial fibrillation (HCC) 05/28/2015  . Syncope 05/12/2015  . Advance care planning 09/16/2014  . Muscle ache 09/16/2014  . Back pain 09/08/2013  . Abdominal pain, chronic, epigastric 09/08/2013  . Fatigue 06/11/2013  . Chest wall mass 11/05/2012  . COPD (chronic obstructive pulmonary disease) (HCC) 11/04/2012  . Arthritis   . Insomnia   . Anxiety   . History of lower GI bleeding  12/11/2011  . Ischemic cardiomyopathy 02/12/2011  . Hyperlipidemia 08/25/2008  . Amiodarone pulmonary toxicity 08/25/2008  . History of MI (myocardial infarction) 02/04/1993    Past Surgical History:  Procedure Laterality Date  . CARDIAC CATHETERIZATION  2004   LAD 30%, D1 30%, CFX-AV groove 70-80%, OM1 30%, EF 20-25%  . CARDIAC CATHETERIZATION N/A 09/28/2015   Procedure: Left Heart Cath and Coronary Angiography;  Surgeon: Peter M Swaziland, MD;  Location: [facility] INVASIVE CV LAB;  Service: Cardiovascular;  Laterality: N/A;  . CARDIAC DEFIBRILLATOR PLACEMENT  2004  . CATARACT EXTRACTION W/ INTRAOCULAR LENS IMPLANT Right 01/2012  . COLONOSCOPY  01/08/2012   Procedure: COLONOSCOPY;  Surgeon: Iva Boop, MD;  Location: [facility] ENDOSCOPY;  Service: Endoscopy;  Laterality: N/A;  . CORONARY ANGIOPLASTY  1995   Pt thinks he got a balloon, living in [place], [place]  . IMPLANTABLE CARDIOVERTER DEFIBRILLATOR GENERATOR CHANGE N/A 02/07/2012   Procedure: IMPLANTABLE CARDIOVERTER DEFIBRILLATOR GENERATOR CHANGE;  Surgeon: Marinus Maw, MD; Medtronic Evera XT VR single-chamber serial number [id number], Laterality: Left  . INSERT / REPLACE / REMOVE PACEMAKER  2004   Medtronic ICD  . KNEE ARTHROSCOPY Left 05/2003   Hattie Perch 06/19/2010  . LAPAROSCOPIC CHOLECYSTECTOMY  1/ 2012  . SHOULDER ARTHROSCOPY W/ ROTATOR CUFF REPAIR Right twice  . TONSILLECTOMY AND ADENOIDECTOMY  ~ 1951  . V TACH ABLATION N/A 04/29/2016   Procedure: V Tach Ablation;  Surgeon: Marinus Maw, MD;  Location: [facility] INVASIVE CV LAB;  Service: Cardiovascular;  Laterality: N/A;        Home Medications    Prior to Admission medications   Medication Sig Start Date End Date Taking? Authorizing Provider  acetaminophen (TYLENOL) 325 MG tablet Take 2 tablets (650 mg total) by mouth every 6 (six) hours as needed for mild pain (or Fever >/= 101). 12/06/15  Yes Vann, Jessica U, DO  albuterol (PROAIR HFA) 108 (90 Base) MCG/ACT inhaler INHALE 1-2 PUFFS INTO  THE LUNGS EVERY 6 (SIX) HOURS AS NEEDED FOR WHEEZING OR SHORTNESS OF BREATH. 10/22/16  Yes Joaquim Nam, MD  amiodarone (PACERONE) 200 MG tablet Take 1 tablet (200 mg total) by mouth daily. 02/14/17  Yes Joaquim Nam, MD  benzonatate (TESSALON) 200 MG capsule TAKE 1 CAPSULE BY MOUTH 3 TIMES DAILY AS NEEDED FOR COUGH Patient taking differently: Take 200 mg by mouth 2 (two) times daily as needed. TAKE 1 CAPSULE BY MOUTH 3 TIMES DAILY AS NEEDED FOR COUGH 12/10/16  Yes Dianne Dun, MD  budesonide-formoterol [facility]) 160-4.5 MCG/ACT inhaler Inhale 2 puffs into the lungs 2 (two) times daily. 08/22/16  Yes Mannam, Praveen, MD  Calcium Carb-Cholecalciferol (CALCIUM 500 +D) 500-400 MG-UNIT TABS Take 1 tablet by mouth daily.   Yes [provider]  Chlorpheniramine-APAP (CORICIDIN) 2-325 MG TABS Take 2 tablets by mouth daily as needed (congestion/runny nose).   Yes [provider]  ELIQUIS 2.5 MG TABS tablet TAKE 1 TABLET (2.5 MG TOTAL) BY MOUTH 2 (TWO) TIMES DAILY. 04/11/17  Yes Marinus Maw, MD  ferrous sulfate (FERROUSUL) 325 (65 FE) MG tablet Take 1 tablet (325 mg total) by mouth daily with breakfast. 10/11/16  Yes Joaquim Nam, MD  furosemide (LASIX) 40 MG tablet Take 1 tablet (40 mg total) by mouth daily. 09/24/16 05/19/17 Yes Marinus Maw, MD  Guaifenesin [facility] MAXIMUM STRENGTH) 1200 MG TB12 Take 1,200 mg by mouth 2 (two) times daily.   Yes [provider]  levalbuterol (XOPENEX) 0.63 MG/3ML nebulizer solution Take 0.63 mg by nebulization every 4 (four) hours as needed for wheezing or shortness of breath.   Yes [provider]  loperamide (IMODIUM) 2 MG capsule Take 1 capsule (2 mg total) by mouth See admin instructions. Take 1 capsule (2 mg) by mouth after each bowel movement Patient taking differently: Take 2 mg by mouth See admin instructions. Take 1 capsule (2 mg) by mouth after each bowel movement as needed 09/21/16  Yes Weaver, Scott T, PA-C    metaxalone (SKELAXIN) 800 MG tablet TAKE 0.5-1 TABLET BY MOUTH 3 TIMES A DAY AS NEEDED FOR MUSCLE SPASMS. Patient taking differently: TAKE 400 mg to 800 mg  TABLET BY MOUTH 3 TIMES A DAY AS NEEDED FOR MUSCLE SPASMS. 03/05/17  Yes Joaquim Nam, MD  mexiletine (MEXITIL) 150 MG capsule Take 2 capsules (300 mg total) by mouth every 8 (eight) hours. 8am, 3:30pm, 11:30pm 08/27/16  Yes Marinus Maw, MD  nitroGLYCERIN (NITROSTAT) 0.4 MG SL tablet Place 1 tablet (0.4 mg total) under the tongue every 5 (five) minutes as needed for chest pain. 02/18/17  Yes Marinus Maw, MD  oxyCODONE (OXY IR/ROXICODONE) 5 MG immediate release tablet Take 5 mg by mouth every 4 (four) hours as needed. 05/14/17  Yes [provider]  OXYGEN Inhale 2 L into the lungs continuous.   Yes [provider]  promethazine (PHENERGAN) 25 MG tablet Take 25 mg by mouth every 6 (six) hours as needed for nausea or vomiting.   Yes [provider]  RANEXA 500 MG 12 hr tablet TAKE 1 TABLET (500 MG TOTAL) BY MOUTH 2 (TWO) TIMES DAILY. 04/08/17  Yes Marinus Maw, MD  ranitidine (ZANTAC) 150 MG capsule Take 150 mg by mouth daily as needed for heartburn.    Yes [provider]  sacubitril-valsartan (ENTRESTO) 24-26 MG Take 1 tablet by mouth 2 (two) times daily. 02/18/17  Yes Marinus Maw, MD  Simethicone (GAS-X PO) Take 1-2 capsules by mouth daily as needed (pressure/bloating/gas). As needed.    Yes [provider]  traMADol (ULTRAM) 50 MG tablet TAKE 1 TABLET BY MOUTH 3 TIMES DAILY AS NEEDED Patient taking differently: TAKE 50 mg TABLET BY MOUTH 3 TIMES DAILY AS NEEDED 04/04/17  Yes Joaquim Nam, MD  vitamin C (ASCORBIC ACID) 500 MG tablet Take 500 mg by mouth daily.   Yes [provider]  Vitamin D, Ergocalciferol, 2000 units CAPS Take 2,000 Units by mouth daily.   Yes [provider]  zolpidem (AMBIEN) 10 MG tablet TAKE 1/2 TO 1 TABLET BY MOUTH AT BEDTIME AS NEEDED FOR  SLEEP Patient taking differently: TAKE 5 mg  TO 10 mg  TABLET BY MOUTH AT BEDTIME AS NEEDED FOR SLEEP 03/19/17  Yes Joaquim Nam, MD  oxyCODONE-acetaminophen (PERCOCET/ROXICET) 5-325 MG tablet Take 2 tablets by mouth every 4 (four) hours as needed. Patient not taking: Reported on 05/19/2017 04/02/17   Rolland Porter, MD    Family History Family History  Problem Relation Age of Onset  . Diabetes Father   .  Tracheal cancer Father 66       smoker  . Stroke Mother   . Cancer Sister        left eye  . CAD Neg Hx   . Colon cancer Neg Hx   . Prostate cancer Neg Hx     Social History Social History   Tobacco Use  . Smoking status: Former Smoker    Packs/day: 0.50    Years: 50.00    Pack years: 25.00    Types: Cigarettes, Cigars    Last attempt to quit: 09/27/2015    Years since quitting: 1.6  . Smokeless tobacco: Never Used  Substance Use Topics  . Alcohol use: No    Alcohol/week: 0.0 oz  . Drug use: No     Allergies   Ace inhibitors; Codeine; Doxycycline; Kionex [sodium polystyrene sulfonate]; Penicillins; Lisinopril; Statins; Hydrocodone; Lidocaine; Pacerone [amiodarone]; and Xanax [alprazolam]   Review of Systems Review of Systems  Constitutional: Negative for chills and fever.  HENT: Negative for ear pain and sore throat.   Eyes: Negative for pain and visual disturbance.  Respiratory: Negative for cough and shortness of breath.   Cardiovascular: Positive for chest pain. Negative for palpitations.  Gastrointestinal: Negative for abdominal pain and vomiting.  Genitourinary: Negative for dysuria and hematuria.  Musculoskeletal: Negative for arthralgias and back pain.  Skin: Negative for color change and rash.  Neurological: Positive for dizziness and light-headedness. Negative for seizures, syncope and headaches.  All other systems reviewed and are negative.    Physical Exam Updated Vital Signs  ED Triage Vitals  Enc Vitals Group     BP 05/19/17 1452 (!) 82/59      Pulse Rate 05/19/17 1452 67     Resp 05/19/17 1452 20     Temp 05/19/17 1452 97.7 F (36.5 C)     Temp Source 05/19/17 1452 Oral     SpO2 05/19/17 1452 98 %     Weight 05/19/17 1453 149 lb (67.6 kg)     Height 05/19/17 1453 5\' 3"  (1.6 m)     Head Circumference --      Peak Flow --      Pain Score 05/19/17 1453 0     Pain Loc --      Pain Edu? --      Excl. in GC? --     Physical Exam  Constitutional: He appears well-developed and well-nourished.  HENT:  Head: Normocephalic and atraumatic.  Eyes: Pupils are equal, round, and reactive to light. Conjunctivae and EOM are normal.  Neck: Normal range of motion. Neck supple.  Cardiovascular: Normal rate, regular rhythm and intact distal pulses.  No murmur heard. Pulmonary/Chest: Effort normal. No respiratory distress. He has rales (RLE).  Abdominal: Soft. There is no tenderness.  Musculoskeletal: He exhibits no edema.  Neurological: He is alert.  Skin: Skin is warm and dry. Capillary refill takes less than 2 seconds.  Psychiatric: He has a normal mood and affect.  Nursing note and vitals reviewed.    ED Treatments / Results  Labs (all labs ordered are listed, but only abnormal results are displayed) Labs Reviewed  MAGNESIUM - Abnormal; Notable for the following components:      Result Value   Magnesium 1.5 (*)    All other components within normal limits  CBC WITH DIFFERENTIAL/PLATELET - Abnormal; Notable for the following components:   RBC 3.56 (*)    Hemoglobin 10.5 (*)    HCT 33.4 (*)    RDW 15.9 (*)  All other components within normal limits  BASIC METABOLIC PANEL - Abnormal; Notable for the following components:   CO2 21 (*)    BUN 22 (*)    Calcium 8.1 (*)    GFR calc non Af Amer 57 (*)    All other components within normal limits  BASIC METABOLIC PANEL  CBC  I-STAT CHEM 8, ED  CBG MONITORING, ED  I-STAT TROPONIN, ED  I-STAT CG4 LACTIC ACID, ED  TYPE AND SCREEN  ABO/RH    EKG EKG  Interpretation  Date/Time:  Monday May 19 2017 14:48:27 EDT Ventricular Rate:  65 PR Interval:    QRS Duration: 209 QT Interval:  511 QTC Calculation: 532 R Axis:   135 Text Interpretation:  Sinus rhythm Ventricular premature complex Prolonged PR interval Right bundle branch block Confirmed by Cathren Laine ([id number]) on 05/19/2017 2:54:54 PM   Radiology Dg Chest Port 1 View  Result Date: 05/19/2017 CLINICAL DATA:  Chest pain.  Defibrillator fired. EXAM: PORTABLE CHEST 1 VIEW COMPARISON:  11/15/2016 FINDINGS: A single lead ICD remains in place. The cardiac silhouette remains enlarged. The patient has taken a shallower inspiration than on the prior study, and lung volumes are low. Chronic prominence of the interstitial markings is unchanged. No airspace consolidation, overt edema, sizable pleural effusion, or pneumothorax is identified. A proximal right humerus fracture is noted (present on 04/02/2017 shoulder radiographs). IMPRESSION: Cardiomegaly and chronic lung changes without evidence of acute abnormality. Electronically Signed   By: Sebastian Ache M.D.   On: 05/19/2017 15:34    Procedures Procedures (including critical care time)  Medications Ordered in ED Medications  amiodarone (NEXTERONE) 1.8 mg/mL load via infusion 150 mg (150 mg Intravenous Bolus from Bag 05/19/17 1751)    Followed by  amiodarone (NEXTERONE PREMIX) 360-4.14 MG/200ML-% (1.8 mg/mL) IV infusion (60 mg/hr Intravenous New Bag/Given 05/19/17 1752)    Followed by  amiodarone (NEXTERONE PREMIX) 360-4.14 MG/200ML-% (1.8 mg/mL) IV infusion (has no administration in time range)  pantoprazole (PROTONIX) 80 mg in sodium chloride 0.9 % 100 mL IVPB (80 mg Intravenous New Bag/Given 05/19/17 1844)  pantoprazole (PROTONIX) 80 mg in sodium chloride 0.9 % 250 mL (0.32 mg/mL) infusion (has no administration in time range)  magnesium sulfate IVPB 2 g 50 mL (0 g Intravenous Stopped 05/19/17 1855)  nitroGLYCERIN (NITROSTAT) SL tablet 0.4 mg  (has no administration in time range)  acetaminophen (TYLENOL) tablet 650 mg (has no administration in time range)  ondansetron (ZOFRAN) injection 4 mg (has no administration in time range)  potassium chloride (K-DUR,KLOR-CON) CR tablet 30 mEq (has no administration in time range)  sodium chloride 0.9 % bolus 500 mL (500 mLs Intravenous Given by EMS 05/19/17 1530)  sodium chloride 0.9 % bolus 500 mL (0 mLs Intravenous Stopped 05/19/17 1745)     Initial Impression / Assessment and Plan / ED Course  I have reviewed the triage vital signs and the nursing notes.  Pertinent labs & imaging results that were available during my care of the patient were reviewed by me and considered in my medical decision making (see chart for details).     CHARBEL LOS is a 72 year old male with history of ischemic cardiomyopathy with EF of 20%, CAD who presents to the ED after several defibrillations over the past 2 days.  Patient states that each time he felt a little bit of chest pain, dizziness, feeling of passing out.  Patient was recently instructed to increase his dose of Lasix twice a day over the  last 2 days.  He is hypotensive but without fever or infectious symptoms upon my evaluation.  Patient on his home 3 L of oxygen due to COPD history.  Patient overall chronically ill-appearing.  Patient has no signs of volume overload on exam.  Suspect arrhythmia versus electrolyte abnormality causing symptoms.  Patient clinically appears volume down.  Patient with EKG that shows sinus rhythm with right bundle branch block but overall looks unchanged from prior EKGs.  Patient has Medtronic pacemaker and will interrogate.  Will obtain labs including electrolytes, troponin, chest x-ray.  No concern for infectious process at this time.  Will consult cardiology for further recommendations.  Patient states that he has not felt shocks since about last August.  He is on amiodarone.  Has been compliant with his medications.   Patient currently asymptomatic. PE less likely given H and P, repeat BP wnl.   Patient with hemoglobin of 10.5 which is significantly down from last hemoglobin several months ago.  Patient denies any melena, hematochezia.  Had a colonoscopy several years ago that was overall unremarkable.  Patient with some gross melena on rectal exam.  Borderline hypotensive and concern for likely slow GI bleed.  Patient given Protonix and started on IV Protonix.  Patient started on amiodarone with IV bolus and drip given multiple episodes of V. tach which we suspect is from GI bleed.  Medtronic report shows 4 episodes of V. tach over the last 24 hours that were resolved with appropriate defibrillation.  Type and screen sent and patient to get 2 large bore IVs.  Patient otherwise with normal lactic acid, no white count and doubt infectious process as patient is also afebrile.  Patient had chest x-ray that showed no pneumonia, pneumothorax, pleural effusion and intact pacemaker.  Patient had low magnesium and was repleted with magnesium via IV.  Patient also given 500 cc of fluid.  Patient to be admitted to cardiology for further care.  Patient had episode of V. tach while in the ED and was appropriately shocked by his ICD.  Was started on amiodarone.  Had rate control following shock.  Patient handed off the cardiology team for further care.  Final Clinical Impressions(s) / ED Diagnoses   Final diagnoses:  V-tach [facility])  Melena  Symptomatic anemia  Hypomagnesemia    ED Discharge Orders    None       Virgina Norfolk, DO 05/19/17 1855    Charlynne Pander, MD 05/22/17 2016

## 2017-05-19 NOTE — ED Triage Notes (Signed)
To ED via [facility] from home with c/o defib fired x 3--  Once at 5am, 1320, 1323--  Pt stated he felt "a little chest pain at the same time"  Pt on arrival is w/d, alert, denies chest pain-- does have a fx right humerus with a sling/splint on-- did not get pt undressed due to discomfort of arm.

## 2017-05-19 NOTE — Telephone Encounter (Signed)
lmtcb/sss 

## 2017-05-19 NOTE — ED Notes (Signed)
RN attempted to call report to floor; RN to call back-Monique,RN  

## 2017-05-19 NOTE — ED Notes (Signed)
Defibrillator interrogated  

## 2017-05-20 ENCOUNTER — Ambulatory Visit: Admitting: Family Medicine

## 2017-05-20 ENCOUNTER — Other Ambulatory Visit: Payer: Self-pay

## 2017-05-20 DIAGNOSIS — I5023 Acute on chronic systolic (congestive) heart failure: Secondary | ICD-10-CM

## 2017-05-20 LAB — BASIC METABOLIC PANEL
ANION GAP: 9 (ref 5–15)
BUN: 19 mg/dL (ref 6–20)
CALCIUM: 8.5 mg/dL — AB (ref 8.9–10.3)
CO2: 24 mmol/L (ref 22–32)
Chloride: 109 mmol/L (ref 101–111)
Creatinine, Ser: 1.22 mg/dL (ref 0.61–1.24)
GFR calc Af Amer: >60 mL/min (ref >60)
GFR, EST NON AFRICAN AMERICAN: 57 mL/min — AB (ref >60)
GLUCOSE: 95 mg/dL (ref 65–99)
POTASSIUM: 4.6 mmol/L (ref 3.5–5.1)
SODIUM: 142 mmol/L (ref 135–145)

## 2017-05-20 LAB — CBC
HCT: 38.8 % — ABNORMAL LOW (ref 39.0–52.0)
HEMOGLOBIN: 12.1 g/dL — AB (ref 13.0–17.0)
MCH: 29.4 pg (ref 26.0–34.0)
MCHC: 31.2 g/dL (ref 30.0–36.0)
MCV: 94.2 fL (ref 78.0–100.0)
Platelets: 305 10*3/uL (ref 150–400)
RBC: 4.12 MIL/uL — ABNORMAL LOW (ref 4.22–5.81)
RDW: 15.9 % — AB (ref 11.5–15.5)
WBC: 7.7 10*3/uL (ref 4.0–10.5)

## 2017-05-20 LAB — MRSA PCR SCREENING: MRSA by PCR: NEGATIVE

## 2017-05-20 LAB — MAGNESIUM: MAGNESIUM: 2.4 mg/dL (ref 1.7–2.4)

## 2017-05-20 IMAGING — DX DG CHEST 1V PORT
1 series · 1 of 1 positions shown · non-contrast
Comparison: PA and lateral chest x-ray December 13, 2015 and
May 25, 2015

CLINICAL DATA: Acute respiratory failure with hypoxia. History of
COPD, atrial fibrillation, recently discontinued smoking.

EXAM:
PORTABLE CHEST 1 VIEW

[chest ap]
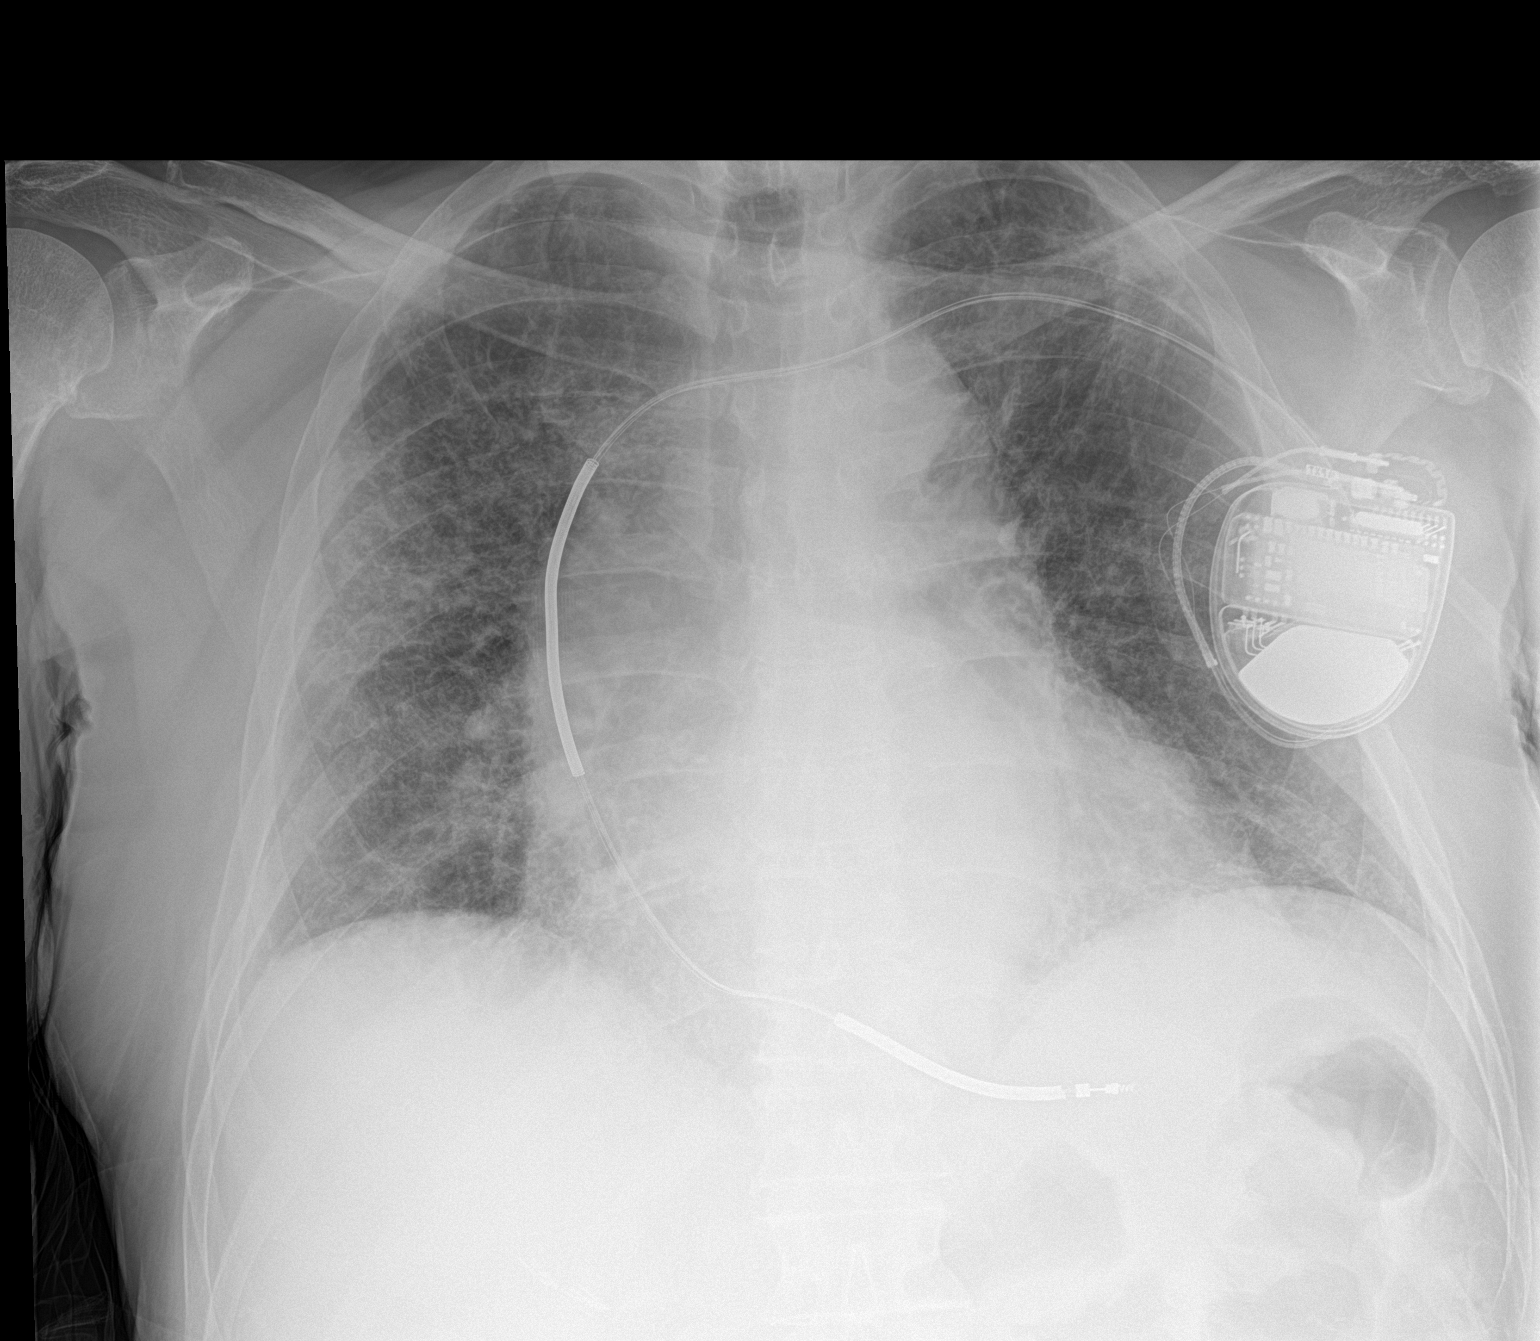

[1 of 1 positions shown; findings below may reference images not displayed]

FINDINGS: The lungs are well-expanded. The interstitial markings remain
increased diffusely. There is no significant pleural effusion. The
cardiac silhouette remains enlarged. The pulmonary vascularity is
not clearly engorged. The ICD is in stable position. The bony thorax
exhibits no acute abnormality. There is chronic widening of the
right AC joint.
IMPRESSION: Persistent diffusely increased interstitial markings consistent with
interstitial edema. There has not been significant interval change
since yesterday's study. Stable cardiomegaly.

## 2017-05-20 MED ORDER — SIMETHICONE 80 MG PO CHEW
80.0000 mg | CHEWABLE_TABLET | Freq: Every day | ORAL | Status: DC | PRN
  Administered 2017-05-20 – 2017-05-21 (×2): 80 mg via ORAL
  Filled 2017-05-20 (×2): qty 1

## 2017-05-20 MED ORDER — FUROSEMIDE 10 MG/ML IJ SOLN
40.0000 mg | Freq: Two times a day (BID) | INTRAMUSCULAR | Status: DC
  Administered 2017-05-20 (×2): 40 mg via INTRAVENOUS
  Filled 2017-05-20 (×2): qty 4

## 2017-05-20 MED ORDER — APIXABAN 2.5 MG PO TABS
2.5000 mg | ORAL_TABLET | Freq: Two times a day (BID) | ORAL | Status: DC
  Administered 2017-05-20 – 2017-05-22 (×5): 2.5 mg via ORAL
  Filled 2017-05-20 (×5): qty 1

## 2017-05-20 NOTE — Progress Notes (Addendum)
Progress Note  Patient Name: [name] Date of Encounter: 05/20/2017  Primary Cardiologist: Dr. [name]   Subjective   Feels OK this AM, no CP, no rest SOB, no further shocks since ER  Inpatient Medications    Scheduled Meds: . calcium-vitamin D  1 tablet Oral Q breakfast  . ferrous sulfate  325 mg Oral Q breakfast  . furosemide  40 mg Oral Daily  . guaiFENesin  1,200 mg Oral BID  . mexiletine  300 mg Oral Q8H  . mometasone-formoterol  2 puff Inhalation BID  . ranolazine  500 mg Oral BID  . sacubitril-valsartan  1 tablet Oral BID   Continuous Infusions: . amiodarone 30 mg/hr (05/19/17 2353)  . pantoprozole (PROTONIX) infusion 8 mg/hr (05/20/17 0739)   PRN Meds: acetaminophen, albuterol, benzonatate, famotidine, levalbuterol, loperamide, nitroGLYCERIN, ondansetron (ZOFRAN) IV, oxyCODONE, traMADol, zolpidem   Vital Signs    Vitals:   05/20/17 0400 05/20/17 0500 05/20/17 0600 05/20/17 0700  BP: 97/62 121/66 (!) 100/56 104/60  Pulse: (!) 54 (!) 45 (!) 58 (!) 47  Resp: 12 13 16  (!) 9  Temp:      TempSrc:      SpO2: 100% 100% 100% 99%  Weight:      Height:        Intake/Output Summary (Last 24 hours) at 05/20/2017 0805 Last data filed at 05/20/2017 0500 Gross per 24 hour  Intake 1688.15 ml  Output 250 ml  Net 1438.15 ml   Filed Weights   05/19/17 1453  Weight: 149 lb (67.6 kg)    Telemetry    SB 40's-50's - Personally Reviewed  ECG    No new EKGs - Personally Reviewed  Physical Exam   GEN: No acute distress, chronically ill appearing   Neck: No JVD Cardiac: RRR, 1/6 SM, no rubs, or gallops.  Respiratory: CTA b/l GI: Soft, nontender, non-distended  MS: No edema; No deformity. Neuro:  Nonfocal  Psych: Normal affect   Labs    Chemistry Recent Labs  Lab 05/19/17 1637 05/20/17 0319  NA 140 142  K 3.8 4.6  CL 108 109  CO2 21* 24  GLUCOSE 97 95  BUN 22* 19  CREATININE 1.22 1.22  CALCIUM 8.1* 8.5*  GFRNONAA 57* 57*  GFRAA >60 >60    ANIONGAP 11 9     Hematology Recent Labs  Lab 05/19/17 1540 05/20/17 0319  WBC 6.9 7.7  RBC 3.56* 4.12*  HGB 10.5* 12.1*  HCT 33.4* 38.8*  MCV 93.8 94.2  MCH 29.5 29.4  MCHC 31.4 31.2  RDW 15.9* 15.9*  PLT 244 305    Cardiac EnzymesNo results for input(s): TROPONINI in the last 168 hours.  Recent Labs  Lab 05/19/17 1547  TROPIPOC 0.02     BNPNo results for input(s): BNP, PROBNP in the last 168 hours.   DDimer No results for input(s): DDIMER in the last 168 hours.   Radiology    Dg Chest Port 1 View Result Date: 05/19/2017 CLINICAL DATA:  Chest pain.  Defibrillator fired. EXAM: PORTABLE CHEST 1 VIEW COMPARISON:  11/15/2016 FINDINGS: A single lead ICD remains in place. The cardiac silhouette remains enlarged. The patient has taken a shallower inspiration than on the prior study, and lung volumes are low. Chronic prominence of the interstitial markings is unchanged. No airspace consolidation, overt edema, sizable pleural effusion, or pneumothorax is identified. A proximal right humerus fracture is noted (present on 04/02/2017 shoulder radiographs). IMPRESSION: Cardiomegaly and chronic lung changes without evidence of acute  abnormality. Electronically Signed   By: Sebastian Ache M.D.   On: 05/19/2017 15:34    Cardiac Studies   09/18/17: TTE Study Conclusions - Left ventricle: The cavity size was normal. Wall thickness was normal. Systolic function was severely reduced. The estimated ejection fraction was in the range of 20% to 25%. Diffuse hypokinesis with the septal wall being most preserved. Features are consistent with a pseudonormal left ventricular filling pattern, with concomitant abnormal relaxation and increased filling pressure (grade 2 diastolic dysfunction). - Aortic valve: There was no stenosis. There was trivial regurgitation. - Mitral valve: There was severe central regurgitation. - Left atrium: The atrium was severely dilated. - Right  ventricle: The cavity size was mildly dilated. Pacer wire or catheter noted in right ventricle. Systolic function was moderately reduced. - Right atrium: The atrium was moderately dilated. - Tricuspid valve: Peak RV-RA gradient (S): 48 mm Hg. - Pulmonary arteries: PA peak pressure: 63 mm Hg (S). - Systemic veins: IVC measured 2.3 cm with < 50% respirophasic variation, suggesting RA pressure 15 mmHg. Impressions - Normal LV size with EF 20-25%, diffuse hypokinesis. Moderate diastolic dysfunction. Mildly dilated RV with moderately decreased systolic function. Severe LAE, moderate RAE. Severe mitral regurgitation centrally directed, likely functional. Moderate pulmonary hypertension.  LHC 09/28/15  Prox RCA to Mid RCA lesion, 100 %stenosed.  Ost LM to LM lesion, 30 %stenosed.  Mid Cx lesion, 70 %stenosed.  Ost 3rd Mrg to 3rd Mrg lesion, 75 %stenosed.  Prox LAD lesion, 35 %stenosed.  There is severe left ventricular systolic dysfunction.  LV end diastolic pressure is severely elevated.  The left ventricular ejection fraction is less than 25% by visual estimate.  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      Patient Profile     72 y.o. male with a history of of CAD,mostly NICMw remote T RCA,lengthy hs ofVT/VT stormsw/ICD, persisitent A Fib, ICM s/p AICD, COPD, anxiety disorder, GIB, multiple hospitalizations for HCAP > ultimately felt to have developed pulm amio toxicity and remains chronically on O2 at home @ 2L.  He has had numerous VT storms, in June 2018, during this hospitalization was decided to resume his amiodarone despite pulm tox 2/2 to persistence of his VT, describved as a last ditch effort to control his VT.  He was admitted yesterday with recurrent VT w/ICD shocks  Carelink Device interrogation 05/19/17 Numerous NSVT episodes between 4/11-today,  05/15/17: 2 failed ATP, one sucessful shock Yesterday: NSVT, VT monitored, and one event  3 failed ATPs, one successful shock Today: NSVT episodes, 3 treated episodes, in tottal had 4 failed ATPs, 3 successful shocks All shocks successful  Device information/history: MDT singlechamber ICD, implanted 08/26/02, Dr. [name], VT 11/29/15 started on mexiletine/Ranexa 11/28/15: VT which degenerated into VF with ATP treatment and he received ICD shock 09/27/15: VT episode that he received 2 ATP therapies for and 1 shock, a number of NSVT episodes, and another VT episode he received ATP for that slowed the tachycardia. Morphology appears that he has 2 different VT's VT storm July 2017 VT storm in April 2017 VT w/shocks May 2018 VT storm June 2018 VT storm Aug 2018 VT storm April 2019 AAD tx:  amiodarone loaded/started July 2017 discontinued 2/2 suspect lung tox >> Mexiletine and Ranexa Hallucinations with lidocaine Feb Quinidine Gluconate stopped ? GI intolerance May 2017 Sotalol added June 2018 recurrent VT >> sotalol stopped and amiodarone restarted   Assessment & Plan    1. VT storm, long hx of refractory VT  Discussed with patient, wants full code status     ATP's have been ineffective, will plan to  Revisit programming prior to discharge  Dr. [name] has seen the patient this AM, plan to continue amio gtt today, keep ICU today  2. ICM     He does not appear overtly OL by exam     corVue suggests fluid OL, cxr does not     IV lasix today  3. CAD     No anginal sounding c/o     No plans for further ischemic evals/caths     Continue home meds  4. Persistent AFib     Out pt on Eliquis (pt declined 5mg  dosing)     protonix gtt ordered in ER, H/H down from last done several months ago     No reports of noted bleeding/signs of bleeding y pt     H/H looks stable, stop protonix gtt     Resume Eliquis  5. COPD,      Pul amio tox     Om 2l n/c home O2, continue here with his resp tx  6. Bradycardia     Keep amio for today     Single lead device, will have  back-up V pacing (asynchronously) if needed @40     For questions or updates, please contact [facility] Please consult [email or web address] for contact info under Cardiology/STEMI.      Signed, Sheilah Pigeon, PA-C  05/20/2017, 8:05 AM    EP attending  Patient seen and examined.  Agree with the findings as noted above.  The patient appears improved this morning after IV Lasix and IV amiodarone.  He has had no recurrent ventricular tachycardia.  Plan will be to continue enter venous amiodarone today and intravenous Lasix therapy.  If he is stable, we will consider switching to oral therapy tomorrow.  The patient expresses strong interest in going home as soon as possible.  He is not interested in hospice or palliative care at this time despite his very poor long-term prognosis.  Lewayne Bunting, MD

## 2017-05-20 NOTE — Care Management Note (Addendum)
Case Management Note Donn Pierini RN, BSN Unit 4E-Case Manager-- 2H coverage [phone]  Patient Details  Name: [name] MRN: [id number] Date of Birth: [birth year]  Subjective/Objective:  Pt admitted with recurrent VT  With ICD shocks                 Action/Plan: PTA pt lived at home with spouse- has baseline home 02 at 2L, anticipate return home- CM to follow for transition of care needs.   Expected Discharge Date:                  Expected Discharge Plan:     In-House Referral:     Discharge planning Services  CM Consult  Post Acute Care Choice:    Choice offered to:     DME Arranged:    DME Agency:     HH Arranged:    HH Agency:     Status of Service:  In process, will continue to follow  If discussed at Long Length of Stay Meetings, dates discussed:    Discharge Disposition:   Additional Comments:  Darrold Span, RN 05/20/2017, 2:57 PM

## 2017-05-21 ENCOUNTER — Encounter: Admitting: Internal Medicine

## 2017-05-21 LAB — BASIC METABOLIC PANEL
Anion gap: 12 (ref 5–15)
BUN: 17 mg/dL (ref 6–20)
CHLORIDE: 108 mmol/L (ref 101–111)
CO2: 20 mmol/L — ABNORMAL LOW (ref 22–32)
Calcium: 8.2 mg/dL — ABNORMAL LOW (ref 8.9–10.3)
Creatinine, Ser: 1.09 mg/dL (ref 0.61–1.24)
GFR calc Af Amer: >60 mL/min (ref >60)
GFR calc non Af Amer: >60 mL/min (ref >60)
GLUCOSE: 84 mg/dL (ref 65–99)
POTASSIUM: 3.7 mmol/L (ref 3.5–5.1)
Sodium: 140 mmol/L (ref 135–145)

## 2017-05-21 MED ORDER — POTASSIUM CHLORIDE ER 10 MEQ PO TBCR: 30.0000 meq | EXTENDED_RELEASE_TABLET | Freq: Once | ORAL | Status: DC

## 2017-05-21 MED ORDER — POTASSIUM CHLORIDE ER 10 MEQ PO TBCR
40.0000 meq | EXTENDED_RELEASE_TABLET | Freq: Once | ORAL | Status: AC
  Administered 2017-05-22: 40 meq via ORAL
  Filled 2017-05-21: qty 4

## 2017-05-21 MED ORDER — DIPHENHYDRAMINE HCL 25 MG PO CAPS
25.0000 mg | ORAL_CAPSULE | Freq: Three times a day (TID) | ORAL | Status: DC | PRN
  Administered 2017-05-21: 25 mg via ORAL
  Filled 2017-05-21: qty 1

## 2017-05-21 MED ORDER — AMIODARONE HCL 200 MG PO TABS
200.0000 mg | ORAL_TABLET | Freq: Two times a day (BID) | ORAL | Status: DC
  Administered 2017-05-21 – 2017-05-22 (×3): 200 mg via ORAL
  Filled 2017-05-21 (×3): qty 1

## 2017-05-21 MED ORDER — FUROSEMIDE 40 MG PO TABS
40.0000 mg | ORAL_TABLET | Freq: Two times a day (BID) | ORAL | Status: DC
  Administered 2017-05-21 – 2017-05-22 (×3): 40 mg via ORAL
  Filled 2017-05-21 (×3): qty 1

## 2017-05-21 MED ORDER — POTASSIUM CHLORIDE 10 MEQ/100ML IV SOLN: 10.0000 meq | Freq: Once | INTRAVENOUS | Status: DC

## 2017-05-21 NOTE — Plan of Care (Signed)
Pt is ambulating to and from bsc w/ little to no assistance. Pt presents w/ a good appetite and is on a cardiac diet. Pt is diuresing greater than 30cc of urine an hour and had a BM during night shift. Bed in lowest position and call light within reach.

## 2017-05-21 NOTE — Progress Notes (Signed)
Pt is having multiple runs of vtach. Longest at 16 beats. Paged [place] that ordered to replace potassium and redraw labs in 2 hours. Will continue to monitor.

## 2017-05-21 NOTE — Progress Notes (Signed)
Patient  Has multiple complaints, about physical, food , nausea, back itching. Addressed all concerns. Assisted with tray in the Am. Right shoulder, arm in sling from previous fall( fx). Notified on call about back itching. No significant ectopy, SB 1st degree block, BBB

## 2017-05-21 NOTE — Progress Notes (Addendum)
Progress Note  Patient Name: [name] Date of Encounter: 05/21/2017  Primary Cardiologist: Dr. [name]   Subjective   Feels OK this AM, no CP, no rest SOB, less bloated  Inpatient Medications    Scheduled Meds: . amiodarone  200 mg Oral BID  . apixaban  2.5 mg Oral BID  . calcium-vitamin D  1 tablet Oral Q breakfast  . ferrous sulfate  325 mg Oral Q breakfast  . furosemide  40 mg Oral BID  . guaiFENesin  1,200 mg Oral BID  . mexiletine  300 mg Oral Q8H  . mometasone-formoterol  2 puff Inhalation BID  . ranolazine  500 mg Oral BID  . sacubitril-valsartan  1 tablet Oral BID   Continuous Infusions:  PRN Meds: acetaminophen, albuterol, benzonatate, famotidine, levalbuterol, loperamide, nitroGLYCERIN, ondansetron (ZOFRAN) IV, oxyCODONE, simethicone, traMADol, zolpidem   Vital Signs    Vitals:   05/21/17 0400 05/21/17 0500 05/21/17 0600 05/21/17 0700  BP: (!) 84/56 95/61 103/61 (!) 88/55  Pulse: 61 (!) 54 61 (!) 56  Resp: (!) 9 10 (!) 9 10  Temp:      TempSrc:      SpO2: 100% 100% 100% 100%  Weight:      Height:        Intake/Output Summary (Last 24 hours) at 05/21/2017 0755 Last data filed at 05/21/2017 0600 Gross per 24 hour  Intake 1121.68 ml  Output 1650 ml  Net -528.32 ml   Filed Weights   05/19/17 1453 05/21/17 0320  Weight: 149 lb (67.6 kg) 140 lb 6.9 oz (63.7 kg)    Telemetry    SB 40's-50's, has had some V pacing- Personally Reviewed  ECG    No new EKGs - Personally Reviewed  Physical Exam   GEN: No acute distress, chronically ill appearing   Neck: No JVD Cardiac: RRR, 1/6 SM, no rubs, or gallops.  Respiratory: CTA b/l GI: Soft, nontender, non-distended  MS: No edema; No deformity. Neuro:  Nonfocal  Psych: Normal affect   Labs    Chemistry Recent Labs  Lab 05/19/17 1637 05/20/17 0319 05/21/17 0248  NA 140 142 140  K 3.8 4.6 3.7  CL 108 109 108  CO2 21* 24 20*  GLUCOSE 97 95 84  BUN 22* 19 17  CREATININE 1.22 1.22 1.09   CALCIUM 8.1* 8.5* 8.2*  GFRNONAA 57* 57* >60  GFRAA >60 >60 >60  ANIONGAP 11 9 12      Hematology Recent Labs  Lab 05/19/17 1540 05/20/17 0319  WBC 6.9 7.7  RBC 3.56* 4.12*  HGB 10.5* 12.1*  HCT 33.4* 38.8*  MCV 93.8 94.2  MCH 29.5 29.4  MCHC 31.4 31.2  RDW 15.9* 15.9*  PLT 244 305    Cardiac EnzymesNo results for input(s): TROPONINI in the last 168 hours.  Recent Labs  Lab 05/19/17 1547  TROPIPOC 0.02     BNPNo results for input(s): BNP, PROBNP in the last 168 hours.   DDimer No results for input(s): DDIMER in the last 168 hours.   Radiology    Dg Chest Port 1 View Result Date: 05/19/2017 CLINICAL DATA:  Chest pain.  Defibrillator fired. EXAM: PORTABLE CHEST 1 VIEW COMPARISON:  11/15/2016 FINDINGS: A single lead ICD remains in place. The cardiac silhouette remains enlarged. The patient has taken a shallower inspiration than on the prior study, and lung volumes are low. Chronic prominence of the interstitial markings is unchanged. No airspace consolidation, overt edema, sizable pleural effusion, or pneumothorax is identified.  A proximal right humerus fracture is noted (present on 04/02/2017 shoulder radiographs). IMPRESSION: Cardiomegaly and chronic lung changes without evidence of acute abnormality. Electronically Signed   By: Sebastian Ache M.D.   On: 05/19/2017 15:34    Cardiac Studies   09/18/17: TTE Study Conclusions - Left ventricle: The cavity size was normal. Wall thickness was normal. Systolic function was severely reduced. The estimated ejection fraction was in the range of 20% to 25%. Diffuse hypokinesis with the septal wall being most preserved. Features are consistent with a pseudonormal left ventricular filling pattern, with concomitant abnormal relaxation and increased filling pressure (grade 2 diastolic dysfunction). - Aortic valve: There was no stenosis. There was trivial regurgitation. - Mitral valve: There was severe central  regurgitation. - Left atrium: The atrium was severely dilated. - Right ventricle: The cavity size was mildly dilated. Pacer wire or catheter noted in right ventricle. Systolic function was moderately reduced. - Right atrium: The atrium was moderately dilated. - Tricuspid valve: Peak RV-RA gradient (S): 48 mm Hg. - Pulmonary arteries: PA peak pressure: 63 mm Hg (S). - Systemic veins: IVC measured 2.3 cm with < 50% respirophasic variation, suggesting RA pressure 15 mmHg. Impressions - Normal LV size with EF 20-25%, diffuse hypokinesis. Moderate diastolic dysfunction. Mildly dilated RV with moderately decreased systolic function. Severe LAE, moderate RAE. Severe mitral regurgitation centrally directed, likely functional. Moderate pulmonary hypertension.  LHC 09/28/15  Prox RCA to Mid RCA lesion, 100 %stenosed.  Ost LM to LM lesion, 30 %stenosed.  Mid Cx lesion, 70 %stenosed.  Ost 3rd Mrg to 3rd Mrg lesion, 75 %stenosed.  Prox LAD lesion, 35 %stenosed.  There is severe left ventricular systolic dysfunction.  LV end diastolic pressure is severely elevated.  The left ventricular ejection fraction is less than 25% by visual estimate.  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      Patient Profile     72 y.o. male with a history of of CAD,mostly NICMw remote T RCA,lengthy hs ofVT/VT stormsw/ICD, persisitent A Fib, ICM s/p AICD, COPD, anxiety disorder, GIB, multiple hospitalizations for HCAP > ultimately felt to have developed pulm amio toxicity and remains chronically on O2 at home @ 2L.  He has had numerous VT storms, in June 2018, during this hospitalization was decided to resume his amiodarone despite pulm tox 2/2 to persistence of his VT, describved as a last ditch effort to control his VT.  He was admitted yesterday with recurrent VT w/ICD shocks  Carelink Device interrogation 05/19/17 Numerous NSVT episodes between 4/11-today,  05/15/17: 2  failed ATP, one sucessful shock Yesterday: NSVT, VT monitored, and one event 3 failed ATPs, one successful shock Today: NSVT episodes, 3 treated episodes, in tottal had 4 failed ATPs, 3 successful shocks All shocks successful  Device information/history: MDT singlechamber ICD, implanted 08/26/02, Dr. [name], VT 11/29/15 started on mexiletine/Ranexa 11/28/15: VT which degenerated into VF with ATP treatment and he received ICD shock 09/27/15: VT episode that he received 2 ATP therapies for and 1 shock, a number of NSVT episodes, and another VT episode he received ATP for that slowed the tachycardia. Morphology appears that he has 2 different VT's VT storm July 2017 VT storm in April 2017 VT w/shocks May 2018 VT storm June 2018 VT storm Aug 2018 VT storm April 2019 AAD tx:  amiodarone loaded/started July 2017 discontinued 2/2 suspect lung tox >> Mexiletine and Ranexa Hallucinations with lidocaine Feb Quinidine Gluconate stopped ? GI intolerance May 2017 Sotalol added June 2018 recurrent VT >>  sotalol stopped and amiodarone restarted   Assessment & Plan    1. VT storm, long hx of refractory VT     Discussed with patient, wants full code status     ATP's have been ineffective, will plan to  Revisit programming prior to discharge  Dr. [name] has seen the patient this AM, plan to transition amio to PO, transfer to telemetry  2. ICM     He does not appear overtly OL by exam     corVue suggests fluid OL, cxr does not     PO lasix today  3. CAD     No anginal sounding c/o     No plans for further ischemic evals/caths     Continue home meds  4. Persistent AFib     Out pt on Eliquis (pt declined 5mg  dosing)     protonix gtt ordered in ER, H/H down from last done several months ago     No reports of noted bleeding/signs of bleeding y pt     H/H looks stable, stop protonix gtt     Resume Eliquis  5. COPD,      Pul amio tox     Om 2l n/c home O2, continue here with his resp  tx  6. Bradycardia     Keep amio for today     Single lead device, will have back-up V pacing (asynchronously) if needed @40     For questions or updates, please contact [facility] Please consult [email or web address] for contact info under Cardiology/STEMI.      Signed, Sheilah Pigeon, PA-C  05/21/2017, 7:55 AM    EP Attending  Patient seen and examined. Agree with above. He has had another quiet day on IV amio and we will stop and switch to oral amio and oral lasix, transfer to tele with plans to DC home tomorrow if clinically stable. His long term prognosis is very poor but he is not interested in pursuing hospice at this time.  Leonia Reeves.D.

## 2017-05-21 NOTE — Plan of Care (Signed)
No further ectopy of note, rate sinus Huston Foley. Transferring today to telmetry

## 2017-05-21 NOTE — Progress Notes (Signed)
Patient standing to ambulate, condom cath leaking. Condom cath stuck to hair around peri area. Patient upset that this happened, nurse attempting to pull off, patient upset that it is hurting. Pulled off as gently as possible, used soaking washcloth, alcohol. Finally got off by wife. Patient bathed. Cleaned, patient refusing to ambulate at this time, " I think I have had enough strain".  Dinner, arrived, set up for dinner, wife feeding patient.

## 2017-05-21 NOTE — Progress Notes (Signed)
Pt transferred to 6E23 on tele and 2L O2. VSS. All belongings with pt. Family aware of pt transfer.

## 2017-05-22 ENCOUNTER — Telehealth: Payer: Self-pay | Admitting: Internal Medicine

## 2017-05-22 DIAGNOSIS — L8990 Pressure ulcer of unspecified site, unspecified stage: Secondary | ICD-10-CM

## 2017-05-22 LAB — MAGNESIUM: MAGNESIUM: 1.8 mg/dL (ref 1.7–2.4)

## 2017-05-22 LAB — BASIC METABOLIC PANEL
Anion gap: 9 (ref 5–15)
BUN: 15 mg/dL (ref 6–20)
CHLORIDE: 102 mmol/L (ref 101–111)
CO2: 25 mmol/L (ref 22–32)
Calcium: 8.2 mg/dL — ABNORMAL LOW (ref 8.9–10.3)
Creatinine, Ser: 1.11 mg/dL (ref 0.61–1.24)
GFR calc non Af Amer: >60 mL/min (ref >60)
Glucose, Bld: 94 mg/dL (ref 65–99)
POTASSIUM: 3.6 mmol/L (ref 3.5–5.1)
SODIUM: 136 mmol/L (ref 135–145)

## 2017-05-22 MED ORDER — POTASSIUM CHLORIDE CRYS ER 20 MEQ PO TBCR
30.0000 meq | EXTENDED_RELEASE_TABLET | Freq: Once | ORAL | Status: AC
  Administered 2017-05-22: 30 meq via ORAL
  Filled 2017-05-22: qty 1

## 2017-05-22 MED ORDER — AMIODARONE HCL 200 MG PO TABS: 200.0000 mg | ORAL_TABLET | Freq: Two times a day (BID) | ORAL | 3 refills | Status: AC

## 2017-05-22 NOTE — Plan of Care (Signed)

## 2017-05-22 NOTE — Discharge Summary (Addendum)
DISCHARGE SUMMARY    Patient ID: LADERRICK PINNOW,  MRN: [id number], DOB/AGE: Jan 08, 1946 72 y.o.  Admit date: 05/19/2017 Discharge date: 05/22/2017  Primary Care Physician: Joaquim Nam, MD  Primary Cardiologist/Electrophysiologist: Dr. [name]  Primary Discharge Diagnosis:  1. VT storm 2. Suspect component of CHF exacerbation  Secondary Discharge Diagnosis:  1. ICM 2. CAD 3. Persistent AFib     CHA2DS2Vasc is 3, on Eliquis (pt insists on low dose OAC) 4. COPD 5. amio toxicity     Chronic home O2  Allergies  Allergen Reactions  . Ace Inhibitors Other (See Comments)    muscle pain. Tolerates ARBs.   . Codeine Other (See Comments)    "head wants to explode."  . Doxycycline Diarrhea and Nausea And Vomiting  . Kionex [Sodium Polystyrene Sulfonate] Other (See Comments)    SOB, pressure in chest, weakness fatigue.   [name] Penicillins Swelling    "started at point of injection; w/in 3 min my upper arm was swollen 3 times normal" Has patient had a PCN reaction causing immediate rash, facial/tongue/throat swelling, SOB or lightheadedness with hypotension: Yes Has patient had a PCN reaction causing severe rash involving mucus membranes or skin necrosis: No Has patient had a PCN reaction that required hospitalization No Has patient had a PCN reaction occurring within the last 10 years: No If all of the above answers are "NO", then may proceed wi  . Lisinopril Other (See Comments)    Muscle Pain  . Statins Other (See Comments)    Myalgias per patient  . Hydrocodone Other (See Comments)    Severe headache, esp when combined with skelaxin.    . Lidocaine     Hallucinations, jerking  . Pacerone [Amiodarone] Other (See Comments)  . Xanax [Alprazolam] Other (See Comments)    Nightmares.       Procedures This Admission:  none  Brief HPI: [name] is a 72 y.o. male wasreferred to the ER by the office given recurrent ICD shocks.  Hospital Course:  The patient  last saw Dr. [name] in Feb noting dizzy spell, though VT had been relatively quite for a couple weeks.  On 05/15/17 he was seen in the device clinic after a shock, he was noted to be fluid OL and his lasix increased for 2 days.  He reported on admission here, no CP, once had a "peck" pain that he took a s/l NTG for, last week, none otherwise. 05/15/17 event he had syncope with, this AM once he had near syncope.  None otherwise.  He mentioned when taking a deep breaths he feels a little lightheaded on occasion.  He denied any bleeding or signs of bleeding, no changes in his stool.  Noting a change over several months in his H/H  He received 2 additional shocks for VT/VF in the ER, [facility] Device interrogation  Numerous NSVT episodes between 4/11-today,  05/15/17: 2 failed ATP, one sucessful shock Yesterday: NSVT, VT monitored, and one event 3 failed ATPs, one successful shock Today: NSVT episodes, 3 treated episodes, in tottal had 4 failed ATPs, 3 successful shocks All shocks successful  His mag was low and replaced, H/H lower by 4g from several months prior, otherwise unrema  Hewas admitted and started in amiodarone and protonix gtts.  His H/H the following morning was stable and protonix stopped.  He was maintained on amiodarone gtt for another 24 hours and transitioned to PO.  He had a number of NSVT episodes on telemetry  on 200mg  PO BID (2 doses).    Hew ambulated today, day of discharge without difficulty, and without provoking arrhythmia.  His ATP therapy in the VT zone were adjusted to be more aggressive.  Will discharge on 200mg  BID of amiodarone with plans to keep this doing likely to mid May.  The patient was examined by Dr. [name], and considered ready for discharge to home. Early follow up has been arranged.  He has a R shoulder fracture/imjury from a previous fall, has follow up with ortho already, he will continue the shoulder immobilizer as per their recommendations.  He was asked to  establish his home dry weight today and monitor daily weights, notify us if more then 3lbs in a day or 5 in a week, will take additional Lasix tab PRN with these parameters.    Physical Exam: Vitals:   05/21/17 2105 05/21/17 2111 05/21/17 2330 05/22/17 0347  BP:  98/67 (!) 109/93 93/62  Pulse:  (!) 58 (!) 59 (!) 53  Resp:  19  15  Temp:  98.1 F (36.7 C)  98.9 F (37.2 C)  TempSrc:  Oral  Oral  SpO2:  100% 98% 99%  Weight: 141 lb 4.8 oz (64.1 kg)   141 lb 6.4 oz (64.1 kg)  Height:        GEN- The patient is well appearing, alert and oriented x 3 today.   HEENT: normocephalic, atraumatic; sclera clear, conjunctiva pink; hearing intact; oropharynx clear Lungs- CTA b/l, normal work of breathing.  No wheezes, rales, rhonchi Heart- RRR, no murmurs, rubs or gallops, PMI not laterally displaced GI- soft, non-tender Extremities- no clubbing, cyanosis, or edema MS- no significant deformity or atrophy Skin- warm and dry, no rash or lesion, left chest without hematoma/ecchymosis Psych- euthymic mood, full affect Neuro- no gross defecits  Labs:   Lab Results  Component Value Date   WBC 7.7 05/20/2017   HGB 12.1 (L) 05/20/2017   HCT 38.8 (L) 05/20/2017   MCV 94.2 05/20/2017   PLT 305 05/20/2017    Recent Labs  Lab 05/22/17 0218  NA 136  K 3.6  CL 102  CO2 25  BUN 15  CREATININE 1.11  CALCIUM 8.2*  GLUCOSE 94    Discharge Medications:  Allergies as of 05/22/2017      Reactions   Ace Inhibitors Other (See Comments)   muscle pain. Tolerates ARBs.    Codeine Other (See Comments)   "head wants to explode."   Doxycycline Diarrhea, Nausea And Vomiting   Kionex [sodium Polystyrene Sulfonate] Other (See Comments)   SOB, pressure in chest, weakness fatigue.   Penicillins Swelling   "started at point of injection; w/in 3 min my upper arm was swollen 3 times normal" Has patient had a PCN reaction causing immediate rash, facial/tongue/throat swelling, SOB or lightheadedness with  hypotension: Yes Has patient had a PCN reaction causing severe rash involving mucus membranes or skin necrosis: No Has patient had a PCN reaction that required hospitalization No Has patient had a PCN reaction occurring within the last 10 years: No If all of the above answers are "NO", then may proceed wi   Lisinopril Other (See Comments)   Muscle Pain   Statins Other (See Comments)   Myalgias per patient   Hydrocodone Other (See Comments)   Severe headache, esp when combined with skelaxin.     Lidocaine    Hallucinations, jerking   Pacerone [amiodarone] Other (See Comments)   Xanax [alprazolam] Other (See Comments)  Nightmares.        Medication List    TAKE these medications   acetaminophen 325 MG tablet Commonly known as:  TYLENOL Take 2 tablets (650 mg total) by mouth every 6 (six) hours as needed for mild pain (or Fever >/= 101).   albuterol 108 (90 Base) MCG/ACT inhaler Commonly known as:  PROAIR HFA INHALE 1-2 PUFFS INTO THE LUNGS EVERY 6 (SIX) HOURS AS NEEDED FOR WHEEZING OR SHORTNESS OF BREATH.   amiodarone 200 MG tablet Commonly known as:  PACERONE Take 1 tablet (200 mg total) by mouth 2 (two) times daily. What changed:  when to take this   benzonatate 200 MG capsule Commonly known as:  TESSALON TAKE 1 CAPSULE BY MOUTH 3 TIMES DAILY AS NEEDED FOR COUGH What changed:    how much to take  how to take this  when to take this  reasons to take this  additional instructions   budesonide-formoterol 160-4.5 MCG/ACT inhaler Commonly known as:  SYMBICORT Inhale 2 puffs into the lungs 2 (two) times daily.   CALCIUM 500 +D 500-400 MG-UNIT Tabs Generic drug:  Calcium Carb-Cholecalciferol Take 1 tablet by mouth daily.   CORICIDIN 2-325 MG Tabs Generic drug:  Chlorpheniramine-APAP Take 2 tablets by mouth daily as needed (congestion/runny nose).   ELIQUIS 2.5 MG Tabs tablet Generic drug:  apixaban TAKE 1 TABLET (2.5 MG TOTAL) BY MOUTH 2 (TWO) TIMES DAILY.     ferrous sulfate 325 (65 FE) MG tablet Commonly known as:  FERROUSUL Take 1 tablet (325 mg total) by mouth daily with breakfast.   furosemide 40 MG tablet Commonly known as:  LASIX Take 1 tablet (40 mg total) by mouth daily.   GAS-X PO Take 1-2 capsules by mouth daily as needed (pressure/bloating/gas). As needed.   levalbuterol 0.63 MG/3ML nebulizer solution Commonly known as:  XOPENEX Take 0.63 mg by nebulization every 4 (four) hours as needed for wheezing or shortness of breath.   loperamide 2 MG capsule Commonly known as:  IMODIUM Take 1 capsule (2 mg total) by mouth See admin instructions. Take 1 capsule (2 mg) by mouth after each bowel movement What changed:  additional instructions   metaxalone 800 MG tablet Commonly known as:  SKELAXIN TAKE 0.5-1 TABLET BY MOUTH 3 TIMES A DAY AS NEEDED FOR MUSCLE SPASMS. What changed:  See the new instructions.   mexiletine 150 MG capsule Commonly known as:  MEXITIL Take 2 capsules (300 mg total) by mouth every 8 (eight) hours. 8am, 3:30pm, 11:30pm   MUCINEX MAXIMUM STRENGTH 1200 MG Tb12 Generic drug:  Guaifenesin Take 1,200 mg by mouth 2 (two) times daily.   nitroGLYCERIN 0.4 MG SL tablet Commonly known as:  NITROSTAT Place 1 tablet (0.4 mg total) under the tongue every 5 (five) minutes as needed for chest pain.   oxyCODONE 5 MG immediate release tablet Commonly known as:  Oxy IR/ROXICODONE Take 5 mg by mouth every 4 (four) hours as needed.   oxyCODONE-acetaminophen 5-325 MG tablet Commonly known as:  PERCOCET/ROXICET Take 2 tablets by mouth every 4 (four) hours as needed. Notes to patient:  Do not use at the same time as the Percocet (oxycodone with acetaminophen. Please confirm these prescriptions with the original prescribing doctors.   OXYGEN Inhale 2 L into the lungs continuous.   promethazine 25 MG tablet Commonly known as:  PHENERGAN Take 25 mg by mouth every 6 (six) hours as needed for nausea or vomiting.   RANEXA  500 MG 12 hr tablet Generic  drug:  ranolazine TAKE 1 TABLET (500 MG TOTAL) BY MOUTH 2 (TWO) TIMES DAILY.   ranitidine 150 MG capsule Commonly known as:  ZANTAC Take 150 mg by mouth daily as needed for heartburn.   sacubitril-valsartan 24-26 MG Commonly known as:  ENTRESTO Take 1 tablet by mouth 2 (two) times daily.   traMADol 50 MG tablet Commonly known as:  ULTRAM TAKE 1 TABLET BY MOUTH 3 TIMES DAILY AS NEEDED What changed:    how much to take  how to take this  when to take this   vitamin C 500 MG tablet Commonly known as:  ASCORBIC ACID Take 500 mg by mouth daily.   Vitamin D (Ergocalciferol) 2000 units Caps Take 2,000 Units by mouth daily.   zolpidem 10 MG tablet Commonly known as:  AMBIEN TAKE 1/2 TO 1 TABLET BY MOUTH AT BEDTIME AS NEEDED FOR SLEEP What changed:    how much to take  how to take this  when to take this  additional instructions       Disposition:  Home Discharge Instructions    Diet - low sodium heart healthy   Complete by:  As directed    Increase activity slowly   Complete by:  As directed      Follow-up Information    Marinus Maw, MD Follow up on 06/06/2017.   Specialty:  Cardiology Why:  4:30PM Contact information: [address] [place] [address] [phone]           Duration of Discharge Encounter: Greater than 30 minutes including physician time.  Norma Fredrickson, PA-C 05/22/2017 1:01 PM   EP Attending  Patient seen and examined. Agree with above. The patient is stable for DC home with usual followup.  Leonia Reeves.D.

## 2017-05-22 NOTE — Care Management (Signed)
0240 05-22-17 Late Entry: Pt was previously active with [facility]. CM received call from Liaison and information provided. No further needs from CM at this time. Gala Lewandowsky, RN,BSN [phone]

## 2017-05-22 NOTE — Progress Notes (Signed)
Discharge instructions, RX's and follow up  appts explained and provided to patient verbalized understanding. Volunteers notified to take patients down.  Kamarius Buckbee, Kae Heller, RN

## 2017-05-22 NOTE — Consult Note (Signed)
            [facility] CM Primary Care Navigator  05/22/2017  ALEX MENDIOLA 04/03/1945 [id number]  Wentto seepatient at the bedside to identify possible discharge needs but he was alreadydischargedhome per staffreport.   Per chart review, was seen for recurrent ventricular tachycardia, had ICD shocks.  Primary care provider's officeis listed as providing transition of care (TOC) follow-up.  Patient also has a discharge instruction to follow-up with cardiology on 06/06/17.  Per Inpatient CM note, patient was previously active with community hosp[facility].   For additional questions please contact:  Karin Golden A. Kalijah Westfall, BSN, RN-BC [facility] PRIMARY CARE Navigator Cell: [phone]

## 2017-05-22 NOTE — Discharge Instructions (Signed)
Ventricular Tachycardia Ventricular tachycardia is a fast heartbeat that begins in the lower chambers of the heart (ventricles). It is a type of abnormal heart rhythm (arrhythmia). A normal heartbeat usually starts when an area in the heart called the sinoatrial (SA) node releases an electrical signal. With ventricular tachycardia, electrical signals in the lower part of the heart fire abnormally and interfere with the electrical signals sent out by the SA node. A normal heart rate is 60-100 beats per minute. During an episode of ventricular tachycardia, the heart reaches 100 beats per minute or higher. This condition can be life-threatening and should be treated immediately. What are the causes? This condition is caused by abnormal electrical activity in the lower part of the heart. This may result from:  Medicines.f  Diseases of the heart muscle (cardiomyopathy).  The heart not getting enough oxygen. This may be caused by blood flow problems in the arteries.  An inflammatory disease that affects multiple areas of the body (sarcoidosis).  Drug use, such as cocaine, amphetamine, or anabolic steroid use.  What increases the risk? You are more likely to develop this condition if:  You have had a heart attack.  You have: ? Heart failure or cardiomyopathy. ? Heart defects that you were born with (congenital heart defects). ? Abnormal heart tissue. ? Heart valves that leak or are narrow. ? Diabetes. ? An infection that affects the heart. ? High blood pressure. ? An overactive or underactive thyroid. ? Sleep apnea. ? A family history of stopped heartbeat (cardiac arrest) or coronary artery disease. ? High cholesterol.  You smoke.  You drink alcohol heavily.  You use drugs, such as cocaine.  What are the signs or symptoms? Symptoms of this condition include:  A pounding heartbeat.  Feeling as if your heart is skipping beats or fluttering (palpitations).  Shortness of  breath.  Anxiety.  Dizziness.  Light-headedness.  Fainting.  Chest pain.  Cardiac arrest caused by an irregular heartbeat (arrhythmia).  How is this diagnosed? This condition may be diagnosed based on:  Your symptoms and medical history.  A physical exam.  Electrocardiogram (ECG). This test is done to check for problems with electrical activity in the heart.  Holter monitor or event monitor test. This test involves wearing a portable device that monitors your heart rate over time.  You may also have other tests, including:  Blood tests.  Chest X-ray.  Echocardiogram. This test involves using sound waves to create images of the heart.  Angiogram. During this test, dye is injected into your bloodstream, and then X-rays are taken. The dye lets your health care provider see how blood flows through your arteries.  Exercise stress test. During this test, an ECG is done while you exercise on a treadmill.  Cardiac CT scan or cardiac MRI.  How is this treated? Treatment for this condition depends on the cause. Treatment may include:  Medicines that slow the heart rate and return it to a normal rhythm (anti-arrhythmics).  An electric shock (cardioversion) that makes the heart go back to a normal rhythm.  An electrophysiology study. This procedure can help locate areas of heart tissue that are causing rapid heartbeats. ? In this procedure, a thin, flexible tube (catheter) is inserted into one of your veins and moved to your heart to evaluate your heart's electrical activity. ? In some cases, the heart tissue that is causing problems may be killed with radiofrequency energy delivered through the catheter (radiofrequency ablation). This may help your heart keep  a normal rhythm.  An implantable cardioverter defibrillator (ICD). This is a small device that monitors your heartbeat. When it senses an irregular heartbeat, it sends a shock to bring the heartbeat back to normal. The ICD  is implanted under the skin in the chest.  Surgery to improve blood flow to the heart.  Genetic counseling to check whether your family members are at risk for ventricular tachycardia.  Follow these instructions at home: Lifestyle  Do not use any products that contain nicotine or tobacco, such as cigarettes and e-cigarettes. If you need help quitting, ask your health care provider.  Do not use stimulant drugs, such as cocaine or methamphetamines.  Maintain a healthy weight.  Manage stress. Try to do this with relaxation exercises, yoga, quiet time, or meditation. Eating and drinking  Eat a healthy diet. This includes plenty of fruits and vegetables, whole grains, lean meats, and low-fat or fat-free dairy products.  Avoid eating foods that are high in saturated fat, trans fat, sugar, or salt (sodium).  Ask your health care provider if you may drink alcohol. ? If alcohol triggers episodes of ventricular tachycardia, do not drink alcohol. ? If alcohol does not trigger episodes, limit alcohol intake to no more than 1 drink a day for nonpregnant women and 2 drinks a day for men. One drink equals 12 oz of beer, 5 oz of wine, or 1 oz of hard liquor. General instructions  Take over-the-counter and prescription medicines only as told by your health care provider.  Exercise regularly. Aim for 150 minutes of moderate exercise or 75 minutes of vigorous exercise per week. Ask your health care provider what exercises are safe for you.  Keep all follow-up visits as told by your health care provider. This is important. Contact a health care provider if:  Your symptoms get worse.  You develop new symptoms, such as new palpitations.  You feel depressed. Get help right away if:  You have an episode of ventricular tachycardia that lasts 30 seconds or more.  You have chest pain.  You have trouble breathing. These symptoms may represent a serious problem that is an emergency. Do not wait to  see if the symptoms will go away. Get medical help right away. Call your local emergency services (911 in the [place] Do not drive yourself to the hospital. Summary  Ventricular tachycardia is a fast heartbeat that begins in the lower chambers of the heart. This condition can be life-threatening and should be treated immediately.  This condition may be treated with medicines, electric shock, radiofrequency energy, surgery, or insertion of an implantable cardioverter defibrillator (ICD).  Get help right away if you have chest pain, trouble breathing, or ventricular tachycardia symptoms that last more than 30 seconds. This information is not intended to replace advice given to you by your health care provider. Make sure you discuss any questions you have with your health care provider. Document Released: 03/14/2016 Document Revised: 03/14/2016 Document Reviewed: 03/14/2016 Elsevier Interactive Patient Education  2018 [employer].    Heart-Healthy Eating Plan Heart-healthy meal planning includes:  Limiting unhealthy fats.  Increasing healthy fats.  Making other small dietary changes.  You may need to talk with your doctor or a diet specialist (dietitian) to create an eating plan that is right for you. What types of fat should I choose?  Choose healthy fats. These include olive oil and canola oil, flaxseeds, walnuts, almonds, and seeds.  Eat more omega-3 fats. These include salmon, mackerel, sardines, tuna, flaxseed oil,  and ground flaxseeds. Try to eat fish at least twice each week.  Limit saturated fats. ? Saturated fats are often found in animal products, such as meats, butter, and cream. ? Plant sources of saturated fats include palm oil, palm kernel oil, and coconut oil.  Avoid foods with partially hydrogenated oils in them. These include stick margarine, some tub margarines, cookies, crackers, and other baked goods. These contain trans fats. What general guidelines do I need to  follow?  Check food labels carefully. Identify foods with trans fats or high amounts of saturated fat.  Fill one half of your plate with vegetables and green salads. Eat 4-5 servings of vegetables per day. A serving of vegetables is: ? 1 cup of raw leafy vegetables. ?  cup of raw or cooked cut-up vegetables. ?  cup of vegetable juice.  Fill one fourth of your plate with whole grains. Look for the word "whole" as the first word in the ingredient list.  Fill one fourth of your plate with lean protein foods.  Eat 4-5 servings of fruit per day. A serving of fruit is: ? One medium whole fruit. ?  cup of dried fruit. ?  cup of fresh, frozen, or canned fruit. ?  cup of 100% fruit juice.  Eat more foods that contain soluble fiber. These include apples, broccoli, carrots, beans, peas, and barley. Try to get 20-30 g of fiber per day.  Eat more home-cooked food. Eat less restaurant, buffet, and fast food.  Limit or avoid alcohol.  Limit foods high in starch and sugar.  Avoid fried foods.  Avoid frying your food. Try baking, boiling, grilling, or broiling it instead. You can also reduce fat by: ? Removing the skin from poultry. ? Removing all visible fats from meats. ? Skimming the fat off of stews, soups, and gravies before serving them. ? Steaming vegetables in water or broth.  Lose weight if you are overweight.  Eat 4-5 servings of nuts, legumes, and seeds per week: ? One serving of dried beans or legumes equals  cup after being cooked. ? One serving of nuts equals 1 ounces. ? One serving of seeds equals  ounce or one tablespoon.  You may need to keep track of how much salt or sodium you eat. This is especially true if you have high blood pressure. Talk with your doctor or dietitian to get more information. What foods can I eat? Grains Breads, including [place], white, pita, wheat, raisin, rye, oatmeal, and [place]. Tortillas that are neither fried nor made with lard or  trans fat. Low-fat rolls, including hotdog and hamburger buns and English muffins. Biscuits. Muffins. Waffles. Pancakes. Light popcorn. Whole-grain cereals. Flatbread. Melba toast. Pretzels. Breadsticks. Rusks. Low-fat snacks. Low-fat crackers, including oyster, saltine, matzo, graham, animal, and rye. Rice and pasta, including brown rice and pastas that are made with whole wheat. Vegetables All vegetables. Fruits All fruits, but limit coconut. Meats and Other Protein Sources Lean, well-trimmed beef, veal, pork, and lamb. Chicken and [place] without skin. All fish and shellfish. Wild duck, rabbit, pheasant, and venison. Egg whites or low-cholesterol egg substitutes. Dried beans, peas, lentils, and tofu. Seeds and most nuts. Dairy Low-fat or nonfat cheeses, including ricotta, string, and mozzarella. Skim or 1% milk that is liquid, powdered, or evaporated. Buttermilk that is made with low-fat milk. Nonfat or low-fat yogurt. Beverages Mineral water. Diet carbonated beverages. Sweets and Desserts Sherbets and fruit ices. Honey, jam, marmalade, jelly, and syrups. Meringues and gelatins. Pure sugar candy, such as  hard candy, jelly beans, gumdrops, mints, marshmallows, and small amounts of dark chocolate. [employer]. Eat all sweets and desserts in moderation. Fats and Oils Nonhydrogenated (trans-free) margarines. Vegetable oils, including soybean, sesame, sunflower, olive, peanut, safflower, corn, canola, and cottonseed. Salad dressings or mayonnaise made with a vegetable oil. Limit added fats and oils that you use for cooking, baking, salads, and as spreads. Other Cocoa powder. Coffee and tea. All seasonings and condiments. The items listed above may not be a complete list of recommended foods or beverages. Contact your dietitian for more options. What foods are not recommended? Grains Breads that are made with saturated or trans fats, oils, or whole milk. Croissants. Butter rolls. Cheese breads.  Sweet rolls. Donuts. Buttered popcorn. Chow mein noodles. High-fat crackers, such as cheese or butter crackers. Meats and Other Protein Sources Fatty meats, such as hotdogs, short ribs, sausage, spareribs, bacon, rib eye roast or steak, and mutton. High-fat deli meats, such as salami and bologna. Caviar. Domestic duck and goose. Organ meats, such as kidney, liver, sweetbreads, and heart. Dairy Cream, sour cream, cream cheese, and creamed cottage cheese. Whole-milk cheeses, including blue (bleu), [address], [place], [place], [address], [place], [address], cheddar, [place], and [place]. Whole or 2% milk that is liquid, evaporated, or condensed. Whole buttermilk. Cream sauce or high-fat cheese sauce. Yogurt that is made from whole milk. Beverages Regular sodas and juice drinks with added sugar. Sweets and Desserts Frosting. Pudding. Cookies. Cakes other than angel food cake. Candy that has milk chocolate or white chocolate, hydrogenated fat, butter, coconut, or unknown ingredients. Buttered syrups. Full-fat ice cream or ice cream drinks. Fats and Oils Gravy that has suet, meat fat, or shortening. Cocoa butter, hydrogenated oils, palm oil, coconut oil, palm kernel oil. These can often be found in baked products, candy, fried foods, nondairy creamers, and whipped toppings. Solid fats and shortenings, including bacon fat, salt pork, lard, and butter. Nondairy cream substitutes, such as coffee creamers and sour cream substitutes. Salad dressings that are made of unknown oils, cheese, or sour cream. The items listed above may not be a complete list of foods and beverages to avoid. Contact your dietitian for more information. This information is not intended to replace advice given to you by your health care provider. Make sure you discuss any questions you have with your health care provider. Document Released: 07/23/2011 Document Revised: 06/29/2015 Document Reviewed: 07/15/2013 Elsevier Interactive Patient  Education  [employer].

## 2017-05-22 NOTE — Telephone Encounter (Signed)
New Message:    Please call [name] told her to contact you about getting patient reinstated with Hospice.

## 2017-05-22 NOTE — Telephone Encounter (Signed)
F/U Call:  Nicholaus Bloom a nurse calling,  Is asking if there is any way to have order signed and completed today. She states that patient is getting discharged today and she would like to go ahead and admit patient. Nicholaus Bloom states that if they receive order tomorrow then they can't admit him until tomorrow. Nicholaus Bloom is aware that Dr. [name] is out of office today

## 2017-05-22 NOTE — Telephone Encounter (Signed)
Call placed to wife.  Wife and Pt want to resume with hospice.  Call placed to hospice.  [facility] of care orders for Dr. [name] to sign and return. Will await fax and return orders.

## 2017-05-22 NOTE — Telephone Encounter (Signed)
Resumption of care orders for hospice faxed back to [facility].

## 2017-05-23 DIAGNOSIS — I509 Heart failure, unspecified: Secondary | ICD-10-CM | POA: Diagnosis not present

## 2017-05-23 DIAGNOSIS — I50814 Right heart failure due to left heart failure: Secondary | ICD-10-CM | POA: Diagnosis not present

## 2017-05-23 DIAGNOSIS — T82118D Breakdown (mechanical) of other cardiac electronic device, subsequent encounter: Secondary | ICD-10-CM | POA: Diagnosis not present

## 2017-05-27 DIAGNOSIS — I509 Heart failure, unspecified: Secondary | ICD-10-CM | POA: Diagnosis not present

## 2017-05-27 DIAGNOSIS — I50814 Right heart failure due to left heart failure: Secondary | ICD-10-CM | POA: Diagnosis not present

## 2017-05-27 DIAGNOSIS — T82118D Breakdown (mechanical) of other cardiac electronic device, subsequent encounter: Secondary | ICD-10-CM | POA: Diagnosis not present

## 2017-05-29 ENCOUNTER — Other Ambulatory Visit: Admitting: *Deleted

## 2017-05-29 ENCOUNTER — Telehealth: Payer: Self-pay | Admitting: *Deleted

## 2017-05-29 DIAGNOSIS — I472 Ventricular tachycardia, unspecified: Secondary | ICD-10-CM

## 2017-05-29 NOTE — Telephone Encounter (Signed)
Spoke to patient regarding ICD shock from today @ 1428. Patient states that he had been having CP and felt weak for most of the day. CP relieved after taking NTG x 1. Weakness relieved after shock. Patient denies any sx's at present. Patient states that he's been taking all of his medications as Rx'd. Last BP was obtained by hospice RN on Tuesday - 106/70.   Episode reviewed with Dr.Klein. Appt made for patient to f/u with [facility] tomorrow @ 1600.  BMP and MAG ordered. Patient to have labs drawn today. Instructed patient to go the ER if sx's return or if he receives anymore shocks. Patient verbalized understanding. Patient aware of driving restrictions.

## 2017-05-30 ENCOUNTER — Encounter: Payer: Self-pay | Admitting: Internal Medicine

## 2017-05-30 ENCOUNTER — Ambulatory Visit (INDEPENDENT_AMBULATORY_CARE_PROVIDER_SITE_OTHER): Payer: Medicare Other | Admitting: Internal Medicine

## 2017-05-30 VITALS — BP 116/62 | HR 69 | Ht 63.0 in | Wt 142.0 lb

## 2017-05-30 DIAGNOSIS — I255 Ischemic cardiomyopathy: Secondary | ICD-10-CM

## 2017-05-30 DIAGNOSIS — Z95810 Presence of automatic (implantable) cardiac defibrillator: Secondary | ICD-10-CM | POA: Diagnosis not present

## 2017-05-30 DIAGNOSIS — I472 Ventricular tachycardia, unspecified: Secondary | ICD-10-CM

## 2017-05-30 DIAGNOSIS — I5022 Chronic systolic (congestive) heart failure: Secondary | ICD-10-CM

## 2017-05-30 LAB — BASIC METABOLIC PANEL
BUN/Creatinine Ratio: 16 (ref 10–24)
BUN: 26 mg/dL (ref 8–27)
CO2: 22 mmol/L (ref 20–29)
CREATININE: 1.65 mg/dL — AB (ref 0.76–1.27)
Calcium: 9.3 mg/dL (ref 8.6–10.2)
Chloride: 106 mmol/L (ref 96–106)
GFR calc Af Amer: 47 mL/min/{1.73_m2} — ABNORMAL LOW (ref >59)
GFR, EST NON AFRICAN AMERICAN: 41 mL/min/{1.73_m2} — AB (ref >59)
Glucose: 167 mg/dL — ABNORMAL HIGH (ref 65–99)
Potassium: 3.5 mmol/L (ref 3.5–5.2)
SODIUM: 140 mmol/L (ref 134–144)

## 2017-05-30 LAB — MAGNESIUM: Magnesium: 2.1 mg/dL (ref 1.6–2.3)

## 2017-05-30 NOTE — Progress Notes (Signed)
HPI [name] returns today for ongoing evaluation and management of ventricular tachycardia.  He is a very pleasant 72 year old man with an ischemic cardiomyopathy and chronic systolic heart failure, paroxysmal atrial fibrillation, who has had progressively worsening episodes of ventricular tachycardia.  Previously, the patient developed lung toxicity with amiodarone and it was held for many months, but then he had worsening ventricular tachycardia.  We started the amiodarone back several months ago.  He initially did well but has recently had more VT.  He was in the hospital last week with VT, and we started mexiletine and treated him with intravenous Lasix.  He developed another episode of VT yesterday and was successfully treated with an ICD shock after multiple rounds of ATP were unsuccessful.  Previously we had increased the aggressiveness of his anti-tachycardic pacing therapy.  He denies medical noncompliance. Allergies  Allergen Reactions  . Ace Inhibitors Other (See Comments)    muscle pain. Tolerates ARBs.   . Codeine Other (See Comments)    "head wants to explode."  . Doxycycline Diarrhea and Nausea And Vomiting  . Kionex [Sodium Polystyrene Sulfonate] Other (See Comments)    SOB, pressure in chest, weakness fatigue.   [name] Penicillins Swelling    "started at point of injection; w/in 3 min my upper arm was swollen 3 times normal" Has patient had a PCN reaction causing immediate rash, facial/tongue/throat swelling, SOB or lightheadedness with hypotension: Yes Has patient had a PCN reaction causing severe rash involving mucus membranes or skin necrosis: No Has patient had a PCN reaction that required hospitalization No Has patient had a PCN reaction occurring within the last 10 years: No If all of the above answers are "NO", then may proceed wi  . Lisinopril Other (See Comments)    Muscle Pain  . Statins Other (See Comments)    Myalgias per patient  . Hydrocodone Other (See  Comments)    Severe headache, esp when combined with skelaxin.    . Lidocaine     Hallucinations, jerking  . Pacerone [Amiodarone] Other (See Comments)  . Xanax [Alprazolam] Other (See Comments)    Nightmares.       Current Outpatient Medications  Medication Sig Dispense Refill  . acetaminophen (TYLENOL) 325 MG tablet Take 2 tablets (650 mg total) by mouth every 6 (six) hours as needed for mild pain (or Fever >/= 101).    [name] albuterol (PROAIR HFA) 108 (90 Base) MCG/ACT inhaler INHALE 1-2 PUFFS INTO THE LUNGS EVERY 6 (SIX) HOURS AS NEEDED FOR WHEEZING OR SHORTNESS OF BREATH. 8.5 Inhaler 0  . amiodarone (PACERONE) 200 MG tablet Take 1 tablet (200 mg total) by mouth 2 (two) times daily. 60 tablet 3  . benzonatate (TESSALON) 200 MG capsule TAKE 1 CAPSULE BY MOUTH 3 TIMES DAILY AS NEEDED FOR COUGH (Patient taking differently: Take 200 mg by mouth 2 (two) times daily as needed. TAKE 1 CAPSULE BY MOUTH 3 TIMES DAILY AS NEEDED FOR COUGH) 30 capsule 1  . budesonide-formoterol (SYMBICORT) 160-4.5 MCG/ACT inhaler Inhale 2 puffs into the lungs 2 (two) times daily. 1 Inhaler 6  . Calcium Carb-Cholecalciferol (CALCIUM 500 +D) 500-400 MG-UNIT TABS Take 1 tablet by mouth daily.    . Chlorpheniramine-APAP (CORICIDIN) 2-325 MG TABS Take 2 tablets by mouth daily as needed (congestion/runny nose).    [name] ELIQUIS 2.5 MG TABS tablet TAKE 1 TABLET (2.5 MG TOTAL) BY MOUTH 2 (TWO) TIMES DAILY. 180 tablet 1  . ferrous sulfate (FERROUSUL) 325 (65 FE)  MG tablet Take 1 tablet (325 mg total) by mouth daily with breakfast.    . Guaifenesin (MUCINEX MAXIMUM STRENGTH) 1200 MG TB12 Take 1,200 mg by mouth 2 (two) times daily.    [name] levalbuterol (XOPENEX) 0.63 MG/3ML nebulizer solution Take 0.63 mg by nebulization every 4 (four) hours as needed for wheezing or shortness of breath.    . loperamide (IMODIUM) 2 MG capsule Take 1 capsule (2 mg total) by mouth See admin instructions. Take 1 capsule (2 mg) by mouth after each bowel  movement (Patient taking differently: Take 2 mg by mouth See admin instructions. Take 1 capsule (2 mg) by mouth after each bowel movement as needed)    . metaxalone (SKELAXIN) 800 MG tablet TAKE 0.5-1 TABLET BY MOUTH 3 TIMES A DAY AS NEEDED FOR MUSCLE SPASMS. (Patient taking differently: TAKE 400 mg to 800 mg  TABLET BY MOUTH 3 TIMES A DAY AS NEEDED FOR MUSCLE SPASMS.) 60 tablet 2  . mexiletine (MEXITIL) 150 MG capsule Take 2 capsules (300 mg total) by mouth every 8 (eight) hours. 8am, 3:30pm, 11:30pm 180 capsule 11  . nitroGLYCERIN (NITROSTAT) 0.4 MG SL tablet Place 1 tablet (0.4 mg total) under the tongue every 5 (five) minutes as needed for chest pain. 30 tablet 3  . oxyCODONE (OXY IR/ROXICODONE) 5 MG immediate release tablet Take 5 mg by mouth every 4 (four) hours as needed.  0  . oxyCODONE-acetaminophen (PERCOCET/ROXICET) 5-325 MG tablet Take 2 tablets by mouth every 4 (four) hours as needed. 15 tablet 0  . OXYGEN Inhale 2 L into the lungs continuous.    . promethazine (PHENERGAN) 25 MG tablet Take 25 mg by mouth every 6 (six) hours as needed for nausea or vomiting.    [name] RANEXA 500 MG 12 hr tablet TAKE 1 TABLET (500 MG TOTAL) BY MOUTH 2 (TWO) TIMES DAILY. 180 tablet 1  . ranitidine (ZANTAC) 150 MG capsule Take 150 mg by mouth daily as needed for heartburn.     . sacubitril-valsartan (ENTRESTO) 24-26 MG Take 1 tablet by mouth 2 (two) times daily. 60 tablet 11  . Simethicone (GAS-X PO) Take 1-2 capsules by mouth daily as needed (pressure/bloating/gas). As needed.     . traMADol (ULTRAM) 50 MG tablet TAKE 1 TABLET BY MOUTH 3 TIMES DAILY AS NEEDED (Patient taking differently: TAKE 50 mg TABLET BY MOUTH 3 TIMES DAILY AS NEEDED) 100 tablet 0  . vitamin C (ASCORBIC ACID) 500 MG tablet Take 500 mg by mouth daily.    . Vitamin D, Ergocalciferol, 2000 units CAPS Take 2,000 Units by mouth daily.    [name] zolpidem (AMBIEN) 10 MG tablet TAKE 1/2 TO 1 TABLET BY MOUTH AT BEDTIME AS NEEDED FOR SLEEP (Patient taking  differently: TAKE 5 mg  TO 10 mg  TABLET BY MOUTH AT BEDTIME AS NEEDED FOR SLEEP) 30 tablet 2  . furosemide (LASIX) 40 MG tablet Take 1 tablet (40 mg total) by mouth daily. 90 tablet 3   No current facility-administered medications for this visit.      Past Medical History:  Diagnosis Date  . AICD (automatic cardioverter/defibrillator) present   . Allergic rhinitis, cause unspecified   . Anxiety   . Arthritis    "all over" (11/07/2015)  . Atrial fibrillation (HCC)    a. 05/2015 - converted to sinus in setting of ICD shocks; placed on eliquis 5 bid.  [name] CAD (coronary artery disease)   . Cervical herniated disc    told not to lift >10 lbs  .  Chronic systolic CHF (congestive heart failure), NYHA class 2 (HCC)    Reports EF of 25%.   [name] COPD (chronic obstructive pulmonary disease) (HCC) 11/2012   by xray  . Diverticulosis    by CT scan  . HCAP (healthcare-associated pneumonia) 11/06/2015  . History of lower GI bleeding 12/11/2011   "first time" (12/11/2011)  . History of MI (myocardial infarction) 1995   Pt living in [place], no stent, ?PTCA  . Hypertension   . Insomnia   . Ischemic cardiomyopathy    Echo 8/18: EF 20-25, diffuse HK, grade 2 DD, trivial AI, severe central MR, severe LAE, moderately reduced RVSF, moderate RAE, PASP 63  . Perennial allergic rhinitis    only to dust mites  . Pneumonia 2000s   "walking pneumonia"  . VT (ventricular tachycardia) (HCC)    a. 05/2015 - VT storm with multiple ICD shocks-->Amio 400 BID.    ROS:   All systems reviewed and negative except as noted in the HPI.   Past Surgical History:  Procedure Laterality Date  . CARDIAC CATHETERIZATION  2004   LAD 30%, D1 30%, CFX-AV groove 70-80%, OM1 30%, EF 20-25%  . CARDIAC CATHETERIZATION N/A 09/28/2015   Procedure: Left Heart Cath and Coronary Angiography;  Surgeon: Peter M Swaziland, MD;  [facility] IN[facility]AS[facility] CV LAB;  Service: Cardiovascular;  Laterality: N/A;  . CARDIAC DEFIBRILLATOR PLACEMENT   2004  . CATARACT EXTRACTION W/ INTRAOCULAR LENS IMPLANT Right 01/2012  . COLONOSCOPY  01/08/2012   Procedure: COLONOSCOPY;  Surgeon: Iva Boop, MD;  Location: [facility] ENDOSCOPY;  Service: Endoscopy;  Laterality: N/A;  . CORONARY ANGIOPLASTY  1995   Pt thinks he got a balloon, living in [place], [place]  . IMPLANTABLE CARDIOVERTER DEFIBRILLATOR GENERATOR CHANGE N/A 02/07/2012   Procedure: IMPLANTABLE CARDIOVERTER DEFIBRILLATOR GENERATOR CHANGE;  Surgeon: Marinus Maw, MD; Medtronic Evera XT VR single-chamber serial number [id number], Laterality: Left  . INSERT / REPLACE / REMOVE PACEMAKER  2004   Medtronic ICD  . KNEE ARTHROSCOPY Left 05/2003   Hattie Perch 06/19/2010  . LAPAROSCOPIC CHOLECYSTECTOMY  1/ 2012  . SHOULDER ARTHROSCOPY W/ ROTATOR CUFF REPAIR Right twice  . TONSILLECTOMY AND ADENOIDECTOMY  ~ 1951  . V TACH ABLATION N/A 04/29/2016   Procedure: V Tach Ablation;  Surgeon: Marinus Maw, MD;  Location: [facility] INVASIVE CV LAB;  Service: Cardiovascular;  Laterality: N/A;     Family History  Problem Relation Age of Onset  . Diabetes Father   . Tracheal cancer Father 91       smoker  . Stroke Mother   . Cancer Sister        left eye  . CAD Neg Hx   . Colon cancer Neg Hx   . Prostate cancer Neg Hx      Social History   Socioeconomic History  . Marital status: Married    Spouse name: Not on file  . Number of children: 0  . Years of education: Not on file  . Highest education level: Not on file  Occupational History  . 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 Financial resource strain: Not on file  . Food insecurity:    Worry: Not on file    Inability: Not on file  . Transportation needs:    Medical: Not on file    Non-medical: Not on file  Tobacco Use  . Smoking status: Former Smoker    Packs/day: 0.50    Years: 50.00    Pack  years: 25.00    Types: Cigarettes, Cigars    Last attempt to quit: 09/27/2015    Years since quitting: 1.6  . Smokeless tobacco: Never  Used  Substance and Sexual Activity  . Alcohol use: No    Alcohol/week: 0.0 oz  . Drug use: No  . Sexual activity: Not Currently  Lifestyle  . Physical activity:    Days per week: Not on file    Minutes per session: Not on file  . Stress: Not on file  Relationships  . Social connections:    Talks on phone: Not on file    Gets together: Not on file    Attends religious service: Not on file    Active member of club or organization: Not on file    Attends meetings of clubs or organizations: Not on file    Relationship status: Not on file  . Intimate partner violence:    Fear of current or ex partner: Not on file    Emotionally abused: Not on file    Physically abused: Not on file    Forced sexual activity: Not on file  Other Topics Concern  . Not on file  Social History Narrative   Lives with wife, married 1998   Grown children, 2 great grandchildren   Occupation: retired, was Presenter, broadcasting   Activity: walking, fishing   Diet: good water daily, fruits/vegetables rare      Wife is Product manager.    [id number], Human resources officer. No known agent orange exposure.       BP 116/62   Pulse 69   Ht 5\' 3"  (1.6 m)   Wt 142 lb (64.4 kg)   SpO2 (!) 79%   BMI 25.15 kg/m   Physical Exam:  Chronically ill appearing 72 year old man, NAD HEENT: Unremarkable Neck: 6 cm JVD, no thyromegally Lymphatics:  No adenopathy Back:  No CVA tenderness Lungs:  Clear, with no wheezes, rales, or rhonchi. HEART:  Regular rate rhythm, no murmurs, no rubs, no clicks Abd:  soft, positive bowel sounds, no organomegally, no rebound, no guarding Ext:  2 plus pulses, no edema, no cyanosis, no clubbing Skin:  No rashes no nodules Neuro:  CN II through XII intact, motor grossly intact  EKG -sinus bradycardia  DEVICE  Normal device function.  See PaceArt for details.   Assess/Plan: 1.  Ventricular tachycardia -the patient continues to have episodes of VT.  Unfortunately it will take a while for the additional  amiodarone increases the amiodarone levels in his system. 2.  Chronic systolic heart failure -his symptoms appear to be fairly well compensated.  He is encouraged to maintain a low-sodium diet.  The patient is going out to celebrate on his birthday and will eat a high salt meal and I have asked him to take an extra Lasix tomorrow morning. 3.  ICD -interrogation of his Medtronic single-chamber ICD demonstrates normal device function and successful shock therapy for ventricular tachycardia. 4.  Atrial fibrillation -he appears to be maintaining sinus rhythm.  I would like him to continue on Eliquis.  Randy Bunting, MD

## 2017-05-30 NOTE — Patient Instructions (Addendum)
Medication Instructions:  Your physician recommends that you continue on your current medications as directed. Please refer to the Current Medication list given to you today.  Labwork: None ordered.  Testing/Procedures: None ordered.  Follow-Up: Please cancel 06/06/2017 appointment and reschedule for 3 months with Dr. [name].  Remote monitoring is used to monitor your ICD from home. This monitoring reduces the number of office visits required to check your device to one time per year. It allows [place] to keep an eye on the functioning of your device to ensure it is working properly. You are scheduled for a device check from home on 09/01/2017. You may send your transmission at any time that day. If you have a wireless device, the transmission will be sent automatically. After your physician reviews your transmission, you will receive a postcard with your next transmission date.  Any Other Special Instructions Will Be Listed Below (If Applicable).  If you need a refill on your cardiac medications before your next appointment, please call your pharmacy.

## 2017-06-02 ENCOUNTER — Other Ambulatory Visit: Payer: Self-pay | Admitting: Internal Medicine

## 2017-06-03 DIAGNOSIS — T82118D Breakdown (mechanical) of other cardiac electronic device, subsequent encounter: Secondary | ICD-10-CM | POA: Diagnosis not present

## 2017-06-03 DIAGNOSIS — I509 Heart failure, unspecified: Secondary | ICD-10-CM | POA: Diagnosis not present

## 2017-06-03 DIAGNOSIS — I50814 Right heart failure due to left heart failure: Secondary | ICD-10-CM | POA: Diagnosis not present

## 2017-06-04 DIAGNOSIS — S42201A Unspecified fracture of upper end of right humerus, initial encounter for closed fracture: Secondary | ICD-10-CM | POA: Diagnosis not present

## 2017-06-04 DIAGNOSIS — I509 Heart failure, unspecified: Secondary | ICD-10-CM | POA: Diagnosis not present

## 2017-06-04 DIAGNOSIS — I50814 Right heart failure due to left heart failure: Secondary | ICD-10-CM | POA: Diagnosis not present

## 2017-06-04 DIAGNOSIS — T82118D Breakdown (mechanical) of other cardiac electronic device, subsequent encounter: Secondary | ICD-10-CM | POA: Diagnosis not present

## 2017-06-06 ENCOUNTER — Encounter: Payer: Federal, State, Local not specified - PPO | Admitting: Internal Medicine

## 2017-06-11 ENCOUNTER — Other Ambulatory Visit: Payer: Self-pay

## 2017-06-11 ENCOUNTER — Emergency Department (HOSPITAL_COMMUNITY)

## 2017-06-11 ENCOUNTER — Telehealth: Payer: Self-pay | Admitting: Cardiology

## 2017-06-11 ENCOUNTER — Emergency Department (HOSPITAL_COMMUNITY)
Admission: EM | Admit: 2017-06-11 | Discharge: 2017-06-12 | Disposition: A | Discharge status: Home / Self Care | Attending: Emergency Medicine | Admitting: Emergency Medicine

## 2017-06-11 ENCOUNTER — Encounter (HOSPITAL_COMMUNITY): Payer: Self-pay

## 2017-06-11 DIAGNOSIS — Z79899 Other long term (current) drug therapy: Secondary | ICD-10-CM | POA: Diagnosis not present

## 2017-06-11 DIAGNOSIS — Z87891 Personal history of nicotine dependence: Secondary | ICD-10-CM | POA: Diagnosis not present

## 2017-06-11 DIAGNOSIS — J449 Chronic obstructive pulmonary disease, unspecified: Secondary | ICD-10-CM | POA: Diagnosis not present

## 2017-06-11 DIAGNOSIS — I110 Hypertensive heart disease with heart failure: Secondary | ICD-10-CM | POA: Insufficient documentation

## 2017-06-11 DIAGNOSIS — I252 Old myocardial infarction: Secondary | ICD-10-CM | POA: Insufficient documentation

## 2017-06-11 DIAGNOSIS — T82118D Breakdown (mechanical) of other cardiac electronic device, subsequent encounter: Secondary | ICD-10-CM | POA: Diagnosis not present

## 2017-06-11 DIAGNOSIS — Z950 Presence of cardiac pacemaker: Secondary | ICD-10-CM | POA: Diagnosis not present

## 2017-06-11 DIAGNOSIS — R531 Weakness: Secondary | ICD-10-CM | POA: Diagnosis not present

## 2017-06-11 DIAGNOSIS — R0602 Shortness of breath: Secondary | ICD-10-CM | POA: Diagnosis not present

## 2017-06-11 DIAGNOSIS — I509 Heart failure, unspecified: Secondary | ICD-10-CM | POA: Diagnosis not present

## 2017-06-11 DIAGNOSIS — I2510 Atherosclerotic heart disease of native coronary artery without angina pectoris: Secondary | ICD-10-CM | POA: Insufficient documentation

## 2017-06-11 DIAGNOSIS — Z7901 Long term (current) use of anticoagulants: Secondary | ICD-10-CM | POA: Diagnosis not present

## 2017-06-11 DIAGNOSIS — R270 Ataxia, unspecified: Secondary | ICD-10-CM | POA: Diagnosis not present

## 2017-06-11 DIAGNOSIS — I50814 Right heart failure due to left heart failure: Secondary | ICD-10-CM | POA: Diagnosis not present

## 2017-06-11 DIAGNOSIS — R42 Dizziness and giddiness: Secondary | ICD-10-CM | POA: Diagnosis not present

## 2017-06-11 DIAGNOSIS — I5022 Chronic systolic (congestive) heart failure: Secondary | ICD-10-CM | POA: Diagnosis not present

## 2017-06-11 DIAGNOSIS — M6281 Muscle weakness (generalized): Secondary | ICD-10-CM | POA: Insufficient documentation

## 2017-06-11 LAB — CBC WITH DIFFERENTIAL/PLATELET
BASOS ABS: 0 10*3/uL (ref 0.0–0.1)
BASOS PCT: 0 %
EOS PCT: 3 %
Eosinophils Absolute: 0.3 10*3/uL (ref 0.0–0.7)
HCT: 38.5 % — ABNORMAL LOW (ref 39.0–52.0)
Hemoglobin: 12.2 g/dL — ABNORMAL LOW (ref 13.0–17.0)
Lymphocytes Relative: 20 %
Lymphs Abs: 1.8 10*3/uL (ref 0.7–4.0)
MCH: 29.6 pg (ref 26.0–34.0)
MCHC: 31.7 g/dL (ref 30.0–36.0)
MCV: 93.4 fL (ref 78.0–100.0)
Monocytes Absolute: 0.7 10*3/uL (ref 0.1–1.0)
Monocytes Relative: 8 %
Neutro Abs: 6.3 10*3/uL (ref 1.7–7.7)
Neutrophils Relative %: 69 %
PLATELETS: 432 10*3/uL — AB (ref 150–400)
RBC: 4.12 MIL/uL — ABNORMAL LOW (ref 4.22–5.81)
RDW: 15.4 % (ref 11.5–15.5)
WBC: 9.2 10*3/uL (ref 4.0–10.5)

## 2017-06-11 LAB — COMPREHENSIVE METABOLIC PANEL
ALBUMIN: 2.8 g/dL — AB (ref 3.5–5.0)
ALK PHOS: 61 U/L (ref 38–126)
ALT: 15 U/L — ABNORMAL LOW (ref 17–63)
AST: 26 U/L (ref 15–41)
Anion gap: 10 (ref 5–15)
BUN: 27 mg/dL — AB (ref 6–20)
CALCIUM: 8.5 mg/dL — AB (ref 8.9–10.3)
CO2: 22 mmol/L (ref 22–32)
CREATININE: 1.61 mg/dL — AB (ref 0.61–1.24)
Chloride: 108 mmol/L (ref 101–111)
GFR calc Af Amer: 47 mL/min — ABNORMAL LOW (ref >60)
GFR calc non Af Amer: 41 mL/min — ABNORMAL LOW (ref >60)
GLUCOSE: 120 mg/dL — AB (ref 65–99)
Potassium: 4.1 mmol/L (ref 3.5–5.1)
Sodium: 140 mmol/L (ref 135–145)
TOTAL PROTEIN: 6.3 g/dL — AB (ref 6.5–8.1)
Total Bilirubin: 0.6 mg/dL (ref 0.3–1.2)

## 2017-06-11 LAB — I-STAT CG4 LACTIC ACID, ED
LACTIC ACID, VENOUS: 1.55 mmol/L (ref 0.5–1.9)
Lactic Acid, Venous: 2.65 mmol/L (ref 0.5–1.9)

## 2017-06-11 MED ORDER — SODIUM CHLORIDE 0.9 % IV BOLUS
500.0000 mL | Freq: Once | INTRAVENOUS | Status: AC
  Administered 2017-06-11: 500 mL via INTRAVENOUS

## 2017-06-11 NOTE — ED Notes (Signed)
Pt has pressure wound from home on sacrum, dressing placed.

## 2017-06-11 NOTE — ED Notes (Signed)
Patient transported to CT 

## 2017-06-11 NOTE — ED Notes (Signed)
Interrogated pacemaker 

## 2017-06-11 NOTE — ED Triage Notes (Signed)
Pt from home with wife, home health hospice nurse wants pt to have urinalysis, CBC, and chem panel per wife. Wife reports pt BP has been low at 70/40 at home. Wife reports pt had temp of 99.8 at home.

## 2017-06-11 NOTE — ED Provider Notes (Addendum)
[facility] EMERGENCY DEPARTMENT Provider Note   CSN: [id number] Arrival date & time: 06/11/17  1854     History   Chief Complaint Chief Complaint  Patient presents with  . Hypotension    HPI [name] is a 72 y.o. male.  72 year old male with prior history of AICD placement, VTach, COPD, chronic systolic CHF, ED, hypertension, and cardiomyopathy presents with complaint of lightheadedness and dizziness.  Patient reports that today he felt intermittently lightheaded and weak.  Symptoms were worse with standing rapidly.  Patient denies any associated fever, chest pain, shortness of breath, nausea, vomiting, focal weakness, or other symptoms.  He feels improved now.  Of note, the patient confirms his DNR status.   The history is provided by the patient.  Weakness  Primary symptoms include dizziness.  Primary symptoms include no focal weakness, no loss of sensation. This is a new problem. The current episode started 6 to 12 hours ago. The problem has not changed since onset.There was no focality noted. There has been no fever. Pertinent negatives include no shortness of breath, no chest pain, no vomiting, no altered mental status, no confusion and no headaches. Associated medical issues do not include trauma.    Past Medical History:  Diagnosis Date  . AICD (automatic cardioverter/defibrillator) present   . Allergic rhinitis, cause unspecified   . Anxiety   . Arthritis    "all over" (11/07/2015)  . Atrial fibrillation (HCC)    a. 05/2015 - converted to sinus in setting of ICD shocks; placed on eliquis 5 bid.  [name] CAD (coronary artery disease)   . Cervical herniated disc    told not to lift >10 lbs  . Chronic systolic CHF (congestive heart failure), NYHA class 2 (HCC)    Reports EF of 25%.   [name] COPD (chronic obstructive pulmonary disease) (HCC) 11/2012   by xray  . Diverticulosis    by CT scan  . HCAP (healthcare-associated pneumonia) 11/06/2015  . History  of lower GI bleeding 12/11/2011   "first time" (12/11/2011)  . History of MI (myocardial infarction) 1995   Pt living in [place], no stent, ?PTCA  . Hypertension   . Insomnia   . Ischemic cardiomyopathy    Echo 8/18: EF 20-25, diffuse HK, grade 2 DD, trivial AI, severe central MR, severe LAE, moderately reduced RVSF, moderate RAE, PASP 63  . Perennial allergic rhinitis    only to dust mites  . Pneumonia 2000s   "walking pneumonia"  . VT (ventricular tachycardia) (HCC)    a. 05/2015 - VT storm with multiple ICD shocks-->Amio 400 BID.    Patient Active Problem List   Diagnosis Date Noted  . Pressure injury of skin 05/22/2017  . Palliative care encounter   . Neck pain 02/09/2017  . Pneumonia   . Perennial allergic rhinitis   . Hypertension   . Diverticulosis   . Chronic systolic CHF (congestive heart failure), NYHA class 2 (HCC)   . Cervical herniated disc   . CAD (coronary artery disease)   . Atrial fibrillation (HCC)   . AICD (automatic cardioverter/defibrillator) present   . Upper respiratory infection 12/10/2016  . Rib pain 11/17/2016  . DNR (do not resuscitate) 09/21/2016  . Iron deficiency anemia 08/15/2016  . RLS (restless legs syndrome) 08/12/2016  . ICD (implantable cardioverter-defibrillator) discharge 06/20/2016  . Atrial tachycardia (HCC) 06/20/2016  . Ventricular tachycardia (HCC) 06/20/2016  . Hyperkalemia 02/13/2016  . AKI (acute kidney injury) (HCC)   . Centrilobular emphysema (  HCC)   . Postcholecystectomy diarrhea 12/12/2015  . Hypoalbuminemia due to protein-calorie malnutrition (HCC)   . Anemia of chronic disease   . Chronic systolic heart failure (HCC)   . Coronary artery disease involving native coronary artery of native heart without angina pectoris   . S/P implantation of automatic cardioverter/defibrillator (AICD)   . Supplemental oxygen dependent   . Physical deconditioning 11/28/2015  . Transaminitis 11/28/2015  . Hypoxia   . HCAP  (healthcare-associated pneumonia) 11/06/2015  . Fall at home 10/11/2015  . VT (ventricular tachycardia) (HCC) 09/27/2015  . Memory loss 09/24/2015  . Encounter for screening examination for infectious disease 09/24/2015  . Persistent atrial fibrillation (HCC) 05/28/2015  . Syncope 05/12/2015  . Advance care planning 09/16/2014  . Muscle ache 09/16/2014  . Back pain 09/08/2013  . Abdominal pain, chronic, epigastric 09/08/2013  . Fatigue 06/11/2013  . Chest wall mass 11/05/2012  . COPD (chronic obstructive pulmonary disease) (HCC) 11/04/2012  . Arthritis   . Insomnia   . Anxiety   . History of lower GI bleeding 12/11/2011  . Ischemic cardiomyopathy 02/12/2011  . Hyperlipidemia 08/25/2008  . Amiodarone pulmonary toxicity 08/25/2008  . History of MI (myocardial infarction) 02/04/1993    Past Surgical History:  Procedure Laterality Date  . CARDIAC CATHETERIZATION  2004   LAD 30%, D1 30%, CFX-AV groove 70-80%, OM1 30%, EF 20-25%  . CARDIAC CATHETERIZATION N/A 09/28/2015   Procedure: Left Heart Cath and Coronary Angiography;  Surgeon: Peter M Swaziland, MD;  Location: [facility] INVASIVE CV LAB;  Service: Cardiovascular;  Laterality: N/A;  . CARDIAC DEFIBRILLATOR PLACEMENT  2004  . CATARACT EXTRACTION W/ INTRAOCULAR LENS IMPLANT Right 01/2012  . COLONOSCOPY  01/08/2012   Procedure: COLONOSCOPY;  Surgeon: Iva Boop, MD;  Location: [facility] ENDOSCOPY;  Service: Endoscopy;  Laterality: N/A;  . CORONARY ANGIOPLASTY  1995   Pt thinks he got a balloon, living in [place], [place]  . IMPLANTABLE CARDIOVERTER DEFIBRILLATOR GENERATOR CHANGE N/A 02/07/2012   Procedure: IMPLANTABLE CARDIOVERTER DEFIBRILLATOR GENERATOR CHANGE;  Surgeon: Marinus Maw, MD; Medtronic Evera XT VR single-chamber serial number [id number], Laterality: Left  . INSERT / REPLACE / REMOVE PACEMAKER  2004   Medtronic ICD  . KNEE ARTHROSCOPY Left 05/2003   Hattie Perch 06/19/2010  . LAPAROSCOPIC CHOLECYSTECTOMY  1/ 2012  . SHOULDER ARTHROSCOPY W/  ROTATOR CUFF REPAIR Right twice  . TONSILLECTOMY AND ADENOIDECTOMY  ~ 1951  . V TACH ABLATION N/A 04/29/2016   Procedure: V Tach Ablation;  Surgeon: Marinus Maw, MD;  Location: [facility] INVASIVE CV LAB;  Service: Cardiovascular;  Laterality: N/A;        Home Medications    Prior to Admission medications   Medication Sig Start Date End Date Taking? Authorizing Provider  acetaminophen (TYLENOL) 325 MG tablet Take 2 tablets (650 mg total) by mouth every 6 (six) hours as needed for mild pain (or Fever >/= 101). 12/06/15  Yes Vann, Jessica U, DO  albuterol (PROAIR HFA) 108 (90 Base) MCG/ACT inhaler INHALE 1-2 PUFFS INTO THE LUNGS EVERY 6 (SIX) HOURS AS NEEDED FOR WHEEZING OR SHORTNESS OF BREATH. 10/22/16  Yes Joaquim Nam, MD  amiodarone (PACERONE) 200 MG tablet Take 1 tablet (200 mg total) by mouth 2 (two) times daily. 05/22/17  Yes Sheilah Pigeon, PA-C  benzonatate (TESSALON) 200 MG capsule TAKE 1 CAPSULE BY MOUTH 3 TIMES DAILY AS NEEDED FOR COUGH 12/10/16  Yes Dianne Dun, MD  budesonide-formoterol [facility]) 160-4.5 MCG/ACT inhaler Inhale 2 puffs into the lungs  2 (two) times daily. 08/22/16  Yes Mannam, Praveen, MD  Calcium Carb-Cholecalciferol (CALCIUM 500 +D) 500-400 MG-UNIT TABS Take 1 tablet by mouth daily.   Yes [provider]  Chlorpheniramine-APAP (CORICIDIN) 2-325 MG TABS Take 2 tablets by mouth daily as needed (congestion/runny nose).   Yes [provider]  ELIQUIS 2.5 MG TABS tablet TAKE 1 TABLET (2.5 MG TOTAL) BY MOUTH 2 (TWO) TIMES DAILY. 04/11/17  Yes Marinus Maw, MD  ferrous sulfate (FERROUSUL) 325 (65 FE) MG tablet Take 1 tablet (325 mg total) by mouth daily with breakfast. 10/11/16  Yes Joaquim Nam, MD  furosemide (LASIX) 40 MG tablet Take 1 tablet (40 mg total) by mouth daily. 09/24/16 06/11/17 Yes Marinus Maw, MD  Guaifenesin [facility] MAXIMUM STRENGTH) 1200 MG TB12 Take 1,200 mg by mouth 2 (two) times daily.   Yes [provider]    levalbuterol (XOPENEX) 0.63 MG/3ML nebulizer solution Take 0.63 mg by nebulization every 4 (four) hours as needed for wheezing or shortness of breath.   Yes [provider]  loperamide (IMODIUM) 2 MG capsule Take 1 capsule (2 mg total) by mouth See admin instructions. Take 1 capsule (2 mg) by mouth after each bowel movement Patient taking differently: Take 2 mg by mouth See admin instructions. Take 1 capsule (2 mg) by mouth after each bowel movement as needed for diarrhea 09/21/16  Yes Weaver, Scott T, PA-C  metaxalone (SKELAXIN) 800 MG tablet TAKE 0.5-1 TABLET BY MOUTH 3 TIMES A DAY AS NEEDED FOR MUSCLE SPASMS. Patient taking differently: TAKE 400 mg to 800 mg  TABLET BY MOUTH 3 TIMES A DAY AS NEEDED FOR MUSCLE SPASMS. 03/05/17  Yes Joaquim Nam, MD  mexiletine (MEXITIL) 150 MG capsule Take 2 capsules (300 mg total) by mouth every 8 (eight) hours. 8am, 3:30pm, 11:30pm 08/27/16  Yes Marinus Maw, MD  nitroGLYCERIN (NITROSTAT) 0.4 MG SL tablet Place 1 tablet (0.4 mg total) under the tongue every 5 (five) minutes as needed for chest pain. 02/18/17  Yes Marinus Maw, MD  oxyCODONE (OXY IR/ROXICODONE) 5 MG immediate release tablet Take 5 mg by mouth every 4 (four) hours as needed for moderate pain.  05/14/17  Yes [provider]  oxyCODONE-acetaminophen (PERCOCET/ROXICET) 5-325 MG tablet Take 2 tablets by mouth every 4 (four) hours as needed. Patient taking differently: Take 2 tablets by mouth every 4 (four) hours as needed for moderate pain.  04/02/17  Yes Rolland Porter, MD  OXYGEN Inhale 4 L into the lungs continuous.    Yes [provider]  promethazine (PHENERGAN) 25 MG tablet Take 25 mg by mouth every 6 (six) hours as needed for nausea or vomiting.   Yes [provider]  RANEXA 500 MG 12 hr tablet TAKE 1 TABLET (500 MG TOTAL) BY MOUTH 2 (TWO) TIMES DAILY. 04/08/17  Yes Marinus Maw, MD  ranitidine (ZANTAC) 150 MG capsule Take 150 mg by mouth daily as needed for  heartburn.    Yes [provider]  sacubitril-valsartan (ENTRESTO) 24-26 MG Take 1 tablet by mouth 2 (two) times daily. 02/18/17  Yes Marinus Maw, MD  Simethicone (GAS-X PO) Take 1-2 capsules by mouth daily as needed (pressure/bloating/gas). As needed.    Yes [provider]  traMADol (ULTRAM) 50 MG tablet TAKE 1 TABLET BY MOUTH 3 TIMES DAILY AS NEEDED Patient taking differently: TAKE 50 mg TABLET BY MOUTH 3 TIMES DAILY AS NEEDED FOR PAIN 04/04/17  Yes Joaquim Nam, MD  vitamin  C (ASCORBIC ACID) 500 MG tablet Take 500 mg by mouth daily.   Yes [provider]  Vitamin D, Ergocalciferol, 2000 units CAPS Take 2,000 Units by mouth daily.   Yes [provider]  zolpidem (AMBIEN) 10 MG tablet TAKE 1/2 TO 1 TABLET BY MOUTH AT BEDTIME AS NEEDED FOR SLEEP Patient taking differently: TAKE 5 mg  TO 10 mg  TABLET BY MOUTH AT BEDTIME AS NEEDED FOR SLEEP 03/19/17  Yes Joaquim Nam, MD    Family History Family History  Problem Relation Age of Onset  . Diabetes Father   . Tracheal cancer Father 10       smoker  . Stroke Mother   . Cancer Sister        left eye  . CAD Neg Hx   . Colon cancer Neg Hx   . Prostate cancer Neg Hx     Social History Social History   Tobacco Use  . Smoking status: Former Smoker    Packs/day: 0.50    Years: 50.00    Pack years: 25.00    Types: Cigarettes, Cigars    Last attempt to quit: 09/27/2015    Years since quitting: 1.7  . Smokeless tobacco: Never Used  Substance Use Topics  . Alcohol use: No    Alcohol/week: 0.0 oz  . Drug use: No     Allergies   Ace inhibitors; Codeine; Doxycycline; Kionex [sodium polystyrene sulfonate]; Penicillins; Lisinopril; Statins; Hydrocodone; Lidocaine; Pacerone [amiodarone]; and Xanax [alprazolam]   Review of Systems Review of Systems  Respiratory: Negative for shortness of breath.   Cardiovascular: Negative for chest pain.  Gastrointestinal: Negative for vomiting.  Neurological:  Positive for dizziness and weakness. Negative for focal weakness and headaches.  Psychiatric/Behavioral: Negative for confusion.  All other systems reviewed and are negative.    Physical Exam Updated Vital Signs BP (!) 87/51   Pulse 68   Temp 99.1 F (37.3 C) (Oral)   Resp 17   Ht 5\' 3"  (1.6 m)   Wt 60.8 kg (134 lb)   SpO2 92%   BMI 23.74 kg/m   Physical Exam  Constitutional: He is oriented to person, place, and time. He appears well-developed and well-nourished. No distress.  HENT:  Head: Normocephalic and atraumatic.  Mouth/Throat: Oropharynx is clear and moist.  Eyes: Pupils are equal, round, and reactive to light. Conjunctivae and EOM are normal.  Neck: Normal range of motion. Neck supple.  Cardiovascular: Normal rate, regular rhythm and normal heart sounds.  Pulmonary/Chest: Effort normal and breath sounds normal. No respiratory distress.  Abdominal: Soft. He exhibits no distension. There is no tenderness.  Musculoskeletal: Normal range of motion. He exhibits no edema or deformity.  Neurological: He is alert and oriented to person, place, and time.  Skin: Skin is warm and dry.  Psychiatric: He has a normal mood and affect.  Nursing note and vitals reviewed.    ED Treatments / Results  Labs (all labs ordered are listed, but only abnormal results are displayed) Labs Reviewed  COMPREHENSIVE METABOLIC PANEL - Abnormal; Notable for the following components:      Result Value   Glucose, Bld 120 (*)    BUN 27 (*)    Creatinine, Ser 1.61 (*)    Calcium 8.5 (*)    Total Protein 6.3 (*)    Albumin 2.8 (*)    ALT 15 (*)    GFR calc non Af Amer 41 (*)    GFR calc Af Amer 47 (*)  All other components within normal limits  CBC WITH DIFFERENTIAL/PLATELET - Abnormal; Notable for the following components:   RBC 4.12 (*)    Hemoglobin 12.2 (*)    HCT 38.5 (*)    Platelets 432 (*)    All other components within normal limits  I-STAT CG4 LACTIC ACID, ED - Abnormal;  Notable for the following components:   Lactic Acid, Venous 2.65 (*)    All other components within normal limits  URINALYSIS, ROUTINE W REFLEX MICROSCOPIC  I-STAT CG4 LACTIC ACID, ED    EKG None  Radiology Dg Chest 2 View  Result Date: 06/11/2017 CLINICAL DATA:  Shortness of breath 1 day with hypotension. EXAM: CHEST - 2 VIEW COMPARISON:  05/19/2017 FINDINGS: Left-sided pacemaker unchanged. Lungs are adequately inflated demonstrate prominence of the perihilar markings suggesting mild interstitial edema. No lobar consolidation or effusion. Mild stable cardiomegaly. Degenerative change of the spine. IMPRESSION: Mild stable cardiomegaly with findings suggesting mild interstitial edema. Electronically Signed   By: Elberta Fortis M.D.   On: 06/11/2017 20:18   Ct Head Wo Contrast  Result Date: 06/11/2017 CLINICAL DATA:  Hypotension and ataxia. EXAM: CT HEAD WITHOUT CONTRAST TECHNIQUE: Contiguous axial images were obtained from the base of the skull through the vertex without intravenous contrast. COMPARISON:  Head CT 11/28/2015 FINDINGS: Brain: No mass lesion, intraparenchymal hemorrhage or extra-axial collection. No evidence of acute cortical infarct. There is periventricular hypoattenuation compatible with chronic microvascular disease. Vascular: No hyperdense vessel or unexpected vascular calcification. Skull: Normal visualized skull base, calvarium and extracranial soft tissues. Sinuses/Orbits: No sinus fluid levels or advanced mucosal thickening. No mastoid effusion. Normal orbits. IMPRESSION: Chronic microvascular ischemia without acute intracranial abnormality. Electronically Signed   By: Deatra Robinson M.D.   On: 06/11/2017 21:26    Procedures Procedures (including critical care time)  Medications Ordered in ED Medications  sodium chloride 0.9 % bolus 500 mL (0 mLs Intravenous Stopped 06/11/17 2333)     Initial Impression / Assessment and Plan / ED Course  I have reviewed the triage vital  signs and the nursing notes.  Pertinent labs & imaging results that were available during my care of the patient were reviewed by me and considered in my medical decision making (see chart for details).     MDM  Screen complete  Patient is presenting for evaluation of weakness and dizziness with rapid standing.  Symptoms onset was earlier today.  At time of my evaluation he feels improved.  He denies any associated symptoms suggestive of cardiac dysfunction or ACS. Symptoms are most suggestive of mild dehydration.   Following ED evaluation and treatment with IV fluids, patient feels improved.  BP's obtained in the ED are consistent with BP's from prior visits.  Screening labs and head CT did not suggest any significant acute pathology that warranted admission.  He now declines further ED work-up or evaluation.  He desires discharge home.  He is aware of the need for close follow-up.  Strict return precautions are given and understood.  The patient and his wife were offered overnight observation in the hospital but he declines.  Final Clinical Impressions(s) / ED Diagnoses   Final diagnoses:  Weakness    ED Discharge Orders    None       Wynetta Fines, MD 06/11/17 2350    Wynetta Fines, MD 06/12/17 0000

## 2017-06-11 NOTE — Telephone Encounter (Signed)
Patient call received from community care hospice RN who is visiting [name] at his home.  Patient was reported feeling dizzy and weak and had been checking his blood pressure throughout the day which was low.  Upon arrival, RN checked blood pressure which was found to be 79/40.  Patient was coherent, alert and oriented but reported feeling dizzy and presyncopal with ambulation.  RN states that patient had a low-grade 99.0 fever and thought to be mildly dehydrated.  She is not equipped for IV fluid administration.  Given patient's symptoms, recommendations were made for him to proceed to the emergency department for possible IV fluid demonstration. Pt denied chest pain.  He reports that his AICD did not fire and he was feeling no palpitations. She reports that he lives at home with his wife who will be driving him to the emergency department.   Georgie Chard NP-C [facility] Pager: [phone]

## 2017-06-11 NOTE — Discharge Instructions (Signed)
Please return for any problem.  Close follow-up with your regular care providers is important - please contact them tomorrow morning.

## 2017-06-12 LAB — URINALYSIS, ROUTINE W REFLEX MICROSCOPIC
BILIRUBIN URINE: NEGATIVE
Glucose, UA: NEGATIVE mg/dL
Hgb urine dipstick: NEGATIVE
KETONES UR: NEGATIVE mg/dL
LEUKOCYTES UA: NEGATIVE
NITRITE: NEGATIVE
Protein, ur: NEGATIVE mg/dL
Specific Gravity, Urine: 1.017 (ref 1.005–1.030)
pH: 5 (ref 5.0–8.0)

## 2017-06-13 ENCOUNTER — Telehealth: Payer: Self-pay | Admitting: Internal Medicine

## 2017-06-13 MED ORDER — FUROSEMIDE 40 MG PO TABS: 20.0000 mg | ORAL_TABLET | Freq: Every day | ORAL | 3 refills | Status: AC

## 2017-06-13 NOTE — Telephone Encounter (Signed)
Called and made wife (DPR on file) and made her aware that Dr. [name] agreed with recommendations to cut lasix in half and take 20 mg QD. Instructed for them to let us know if he noticed increased SOB, LEE, weight gain, or if the patient's symptoms changed or worsened. Wife verbalized understanding and thanked me for the call.

## 2017-06-13 NOTE — Telephone Encounter (Signed)
Pt c/o medication issue:  1. Name of Medication:  furosemide (LASIX) 40 MG tablet(Expired)    2. How are you currently taking this medication (dosage and times per day)?Take 1 tablet (40 mg total) by mouth daily.  3. Are you having a reaction (difficulty breathing--STAT)? no 4. What is your medication issue? Pt went to the ER for dehydration, do Dr.taylor want pt to continue taking the lasix?

## 2017-06-13 NOTE — Telephone Encounter (Signed)
Returned call to patient's wife (DPR on file) who states that the patient was recently in the ER for dehydration. Wife states that the patient received IV fluids and was sent home. Wife is wanting to know if the patient should continue taking lasix 40 mg QD. She states that the patient is not SOB and has no LEE. She states that the patient had lost 3 lbs in 24 hours. She states that the patient has been dizzy with position changes. She states that he has been trying to stay hydrated. Instructed for the patient to cut his lasix in half and take 20 mg QD. Made her aware that the information would be sent to Dr. [name] for review and further recommendation. Instructed for the patient to continue to monitor his weight daily and that he could take the other 1/2 tablet (20 mg) if he noticed increased SOB, LEE, or weight gain. Instructed for the wife to call back if the patient's symptoms changed or worsened before she hears back from [place]. Wife verbalized understanding and thanked me for the call.

## 2017-06-13 NOTE — Telephone Encounter (Signed)
Agree with half dose of lasix. GT

## 2017-06-16 DIAGNOSIS — I509 Heart failure, unspecified: Secondary | ICD-10-CM | POA: Diagnosis not present

## 2017-06-16 DIAGNOSIS — T82118D Breakdown (mechanical) of other cardiac electronic device, subsequent encounter: Secondary | ICD-10-CM | POA: Diagnosis not present

## 2017-06-16 DIAGNOSIS — I50814 Right heart failure due to left heart failure: Secondary | ICD-10-CM | POA: Diagnosis not present

## 2017-06-19 ENCOUNTER — Ambulatory Visit (INDEPENDENT_AMBULATORY_CARE_PROVIDER_SITE_OTHER): Payer: Medicare Other | Admitting: Family Medicine

## 2017-06-19 ENCOUNTER — Encounter

## 2017-06-19 ENCOUNTER — Telehealth: Payer: Self-pay | Admitting: Family Medicine

## 2017-06-19 ENCOUNTER — Encounter: Payer: Self-pay | Admitting: Family Medicine

## 2017-06-19 DIAGNOSIS — I255 Ischemic cardiomyopathy: Secondary | ICD-10-CM | POA: Diagnosis not present

## 2017-06-19 DIAGNOSIS — J449 Chronic obstructive pulmonary disease, unspecified: Secondary | ICD-10-CM | POA: Diagnosis not present

## 2017-06-19 DIAGNOSIS — R42 Dizziness and giddiness: Secondary | ICD-10-CM

## 2017-06-19 DIAGNOSIS — S42209S Unspecified fracture of upper end of unspecified humerus, sequela: Secondary | ICD-10-CM

## 2017-06-19 DIAGNOSIS — R630 Anorexia: Secondary | ICD-10-CM | POA: Diagnosis not present

## 2017-06-19 MED ORDER — MIRTAZAPINE 7.5 MG PO TABS: 7.5000 mg | ORAL_TABLET | Freq: Every day | ORAL | 1 refills | Status: DC

## 2017-06-19 NOTE — Telephone Encounter (Signed)
Copied from CRM [id number]. Topic: Quick Communication - See Telephone Encounter >> Jun 19, 2017  3:10 PM Arlyss Gandy, NT wrote: CRM for notification. See Telephone encounter for: 06/19/17. Anna at [facility] calling and states that the medication mirtazapine (REMERON) 7.5 MG tablet has a drug interaction with amiodarone (PACERONE) 200 MG tablet that causes prolong QT intervals and she would like to verify if the doctor is ok with this pt taking these medications together before dispensing.

## 2017-06-19 NOTE — Progress Notes (Signed)
Seen in ER for lightheadedness, better with IVF.  Discharged home.  Still on hospice.  D/w pt about his goals of care.  He wants to avoid future hospitalizations if at all possible.  No matter what his pulse ox is right now he wants to go to [employer] and eat lunch with his wife.  We discussed.    Dec in appetite noted.  Noted in the last few weeks. He has tried whey protein once yesterday, with a milkshake.  Discussed options.    When he was using symbicort and other inhalers his tongue feel numb ,so he stopped using them.  He is still using his neb w/o troubles.   He can walk in the house but gets SOB.  He is still on O2.    R shoulder in sling for nondisplaced fracture of the right humeral neck.  D/w pt about fracture tx and options.  Pain is usually controlled in sling.   PMH and SH reviewed  ROS: Per HPI unless specifically indicated in ROS section   Meds, vitals, and allergies reviewed.   GEN: nad, alert and oriented HEENT: mucous membranes moist NECK: supple w/o LA CV: sounds to be rrr PULM: ctab, no inc wob ABD: soft, +bs EXT: no edema SKIN: no acute rash  88% on [place] at 4L

## 2017-06-19 NOTE — Telephone Encounter (Signed)
Cancel the mirtazepine rx.  I thank pharmacy for input.   Please route this back to me so I can look at other options.   Thanks.

## 2017-06-19 NOTE — Patient Instructions (Addendum)
Try extra protein with a milkshake.  Take care.  Glad to see you.  Update me as needed.

## 2017-06-20 NOTE — Telephone Encounter (Signed)
Please make sure the patient knows about this too- I'm still checking on options.  Thanks.

## 2017-06-22 DIAGNOSIS — S42309A Unspecified fracture of shaft of humerus, unspecified arm, initial encounter for closed fracture: Secondary | ICD-10-CM | POA: Insufficient documentation

## 2017-06-22 DIAGNOSIS — R42 Dizziness and giddiness: Secondary | ICD-10-CM | POA: Insufficient documentation

## 2017-06-22 DIAGNOSIS — R630 Anorexia: Secondary | ICD-10-CM | POA: Insufficient documentation

## 2017-06-22 NOTE — Telephone Encounter (Signed)
Notify pt.  I checked on this some more.  The other options I can think of for appetite stimulation are likely too risky and I would avoid those meds.  If he has persistent nausea, then we may be able to do something about that.  However, I wouldn't start an appetite stimulant at this point since he shouldn't take mirtazapine.  Thanks.

## 2017-06-22 NOTE — Assessment & Plan Note (Signed)
Resolved now.  Was previously treated with IV fluids.  We talked about his tenuous fluid balance and trying to avoid excess salt.  He should also try to avoid relative dehydration.  He agrees with plan.

## 2017-06-22 NOTE — Assessment & Plan Note (Signed)
We considered options but there is going to be a potential interaction with mirtazapine.  See follow-up phone note.  At this point I would not start an appetite stimulant otherwise.  Continue with protein supplementation. >25 minutes spent in face to face time with patient, >50% spent in counselling or coordination of care.

## 2017-06-22 NOTE — Assessment & Plan Note (Signed)
He has been able to tolerate his current symptoms with supplemental oxygen and albuterol use via nebulizer as needed.  He does not want hospitalization and he did not tolerate his other inhalers.  Regardless of his pulse oximetry at the office visit today, he wants to go to [employer] and eat lunch with his wife.  His goals center around quality of life/comfort at this point so it makes sense not to intervene otherwise as he is not short of breath at this point.  He agrees with plan.

## 2017-06-22 NOTE — Assessment & Plan Note (Signed)
Continue sling.  Pain controlled.  Discussed with patient about typical healing and treatment.  He has seen orthopedics in the meantime.

## 2017-06-23 NOTE — Telephone Encounter (Signed)
Patient advised.

## 2017-06-23 NOTE — Telephone Encounter (Signed)
Left detailed message on voicemail.  

## 2017-06-24 DIAGNOSIS — I50814 Right heart failure due to left heart failure: Secondary | ICD-10-CM | POA: Diagnosis not present

## 2017-06-24 DIAGNOSIS — T82118D Breakdown (mechanical) of other cardiac electronic device, subsequent encounter: Secondary | ICD-10-CM | POA: Diagnosis not present

## 2017-06-24 DIAGNOSIS — I509 Heart failure, unspecified: Secondary | ICD-10-CM | POA: Diagnosis not present

## 2017-06-26 ENCOUNTER — Other Ambulatory Visit: Payer: Self-pay | Admitting: Family Medicine

## 2017-06-26 ENCOUNTER — Encounter: Payer: Federal, State, Local not specified - PPO | Admitting: Internal Medicine

## 2017-06-26 NOTE — Telephone Encounter (Signed)
Electronic refill request. Metaxalone Last office visit:   06/19/17 Last Filled:    60 tablet 2 03/05/2017  Please advise.

## 2017-06-26 NOTE — Telephone Encounter (Signed)
Sent. Thanks.   

## 2017-07-01 ENCOUNTER — Telehealth: Payer: Self-pay | Admitting: Internal Medicine

## 2017-07-01 NOTE — Telephone Encounter (Signed)
Left detailed message.  Notified amiodarone probably causing diarrhea.  Advised he may have to take scheduled imodium instead of as needed. Advised if he had any questions call and ask for this nurse.

## 2017-07-01 NOTE — Telephone Encounter (Signed)
New Message    Pt c/o medication issue:  1. Name of Medication: doesn't know which one  2. How are you currently taking this medication (dosage and times per day)?  3. Are you having a reaction (difficulty breathing--STAT)? yes  4. What is your medication issue? Patient states he has explosive diarrhea for the last 2 months, since he broke his arm  , can you look at his medication list and see if any of the meds will cause this

## 2017-07-02 DIAGNOSIS — S42201A Unspecified fracture of upper end of right humerus, initial encounter for closed fracture: Secondary | ICD-10-CM | POA: Diagnosis not present

## 2017-07-04 ENCOUNTER — Other Ambulatory Visit: Payer: Self-pay

## 2017-07-04 ENCOUNTER — Emergency Department (HOSPITAL_COMMUNITY)
Admission: EM | Admit: 2017-07-04 | Discharge: 2017-07-05 | Disposition: E | Attending: Emergency Medicine | Admitting: Emergency Medicine

## 2017-07-04 ENCOUNTER — Emergency Department (HOSPITAL_COMMUNITY)

## 2017-07-04 ENCOUNTER — Encounter (HOSPITAL_COMMUNITY): Payer: Self-pay | Admitting: *Deleted

## 2017-07-04 DIAGNOSIS — I469 Cardiac arrest, cause unspecified: Secondary | ICD-10-CM | POA: Insufficient documentation

## 2017-07-04 DIAGNOSIS — J9601 Acute respiratory failure with hypoxia: Secondary | ICD-10-CM | POA: Diagnosis not present

## 2017-07-04 DIAGNOSIS — I2510 Atherosclerotic heart disease of native coronary artery without angina pectoris: Secondary | ICD-10-CM | POA: Diagnosis not present

## 2017-07-04 DIAGNOSIS — Z79899 Other long term (current) drug therapy: Secondary | ICD-10-CM | POA: Diagnosis not present

## 2017-07-04 DIAGNOSIS — Z7189 Other specified counseling: Secondary | ICD-10-CM | POA: Diagnosis not present

## 2017-07-04 DIAGNOSIS — R001 Bradycardia, unspecified: Secondary | ICD-10-CM | POA: Diagnosis not present

## 2017-07-04 DIAGNOSIS — R0689 Other abnormalities of breathing: Secondary | ICD-10-CM | POA: Diagnosis not present

## 2017-07-04 DIAGNOSIS — Z87891 Personal history of nicotine dependence: Secondary | ICD-10-CM | POA: Insufficient documentation

## 2017-07-04 DIAGNOSIS — J449 Chronic obstructive pulmonary disease, unspecified: Secondary | ICD-10-CM | POA: Diagnosis not present

## 2017-07-04 DIAGNOSIS — R092 Respiratory arrest: Secondary | ICD-10-CM | POA: Diagnosis not present

## 2017-07-04 DIAGNOSIS — I499 Cardiac arrhythmia, unspecified: Secondary | ICD-10-CM | POA: Diagnosis not present

## 2017-07-04 DIAGNOSIS — Z515 Encounter for palliative care: Secondary | ICD-10-CM

## 2017-07-04 DIAGNOSIS — Z955 Presence of coronary angioplasty implant and graft: Secondary | ICD-10-CM | POA: Diagnosis not present

## 2017-07-04 DIAGNOSIS — R402441 Other coma, without documented Glasgow coma scale score, or with partial score reported, in the field [EMT or ambulance]: Secondary | ICD-10-CM | POA: Diagnosis not present

## 2017-07-04 LAB — I-STAT CG4 LACTIC ACID, ED: LACTIC ACID, VENOUS: 8.75 mmol/L — AB (ref 0.5–1.9)

## 2017-07-04 LAB — CBG MONITORING, ED: GLUCOSE-CAPILLARY: 133 mg/dL — AB (ref 65–99)

## 2017-07-04 LAB — I-STAT TROPONIN, ED: TROPONIN I, POC: 0.02 ng/mL (ref 0.00–0.08)

## 2017-07-04 MED ORDER — MORPHINE SULFATE (PF) 4 MG/ML IV SOLN
INTRAVENOUS | Status: AC
  Administered 2017-07-04: 4 mg
  Filled 2017-07-04: qty 1

## 2017-07-04 MED ORDER — MORPHINE SULFATE (PF) 4 MG/ML IV SOLN
4.0000 mg | Freq: Once | INTRAVENOUS | Status: AC
  Administered 2017-07-04: 4 mg via INTRAVENOUS
  Filled 2017-07-04: qty 1

## 2017-07-04 MED ORDER — EPINEPHRINE PF 1 MG/ML IJ SOLN
0.5000 ug/min | INTRAVENOUS | Status: DC
  Filled 2017-07-04: qty 4

## 2017-07-04 MED ORDER — MORPHINE BOLUS VIA INFUSION
5.0000 mg | INTRAVENOUS | Status: DC | PRN
  Filled 2017-07-04: qty 20

## 2017-07-04 MED ORDER — MORPHINE 100MG IN NS 100ML (1MG/ML) PREMIX INFUSION
10.0000 mg/h | INTRAVENOUS | Status: DC
  Administered 2017-07-04: 10 mg/h via INTRAVENOUS
  Filled 2017-07-04: qty 100

## 2017-07-05 NOTE — ED Provider Notes (Signed)
[facility] EMERGENCY DEPARTMENT Provider Note   CSN: [id number] Arrival date & time: 08/03/17  1631     History   Chief Complaint Chief Complaint  Patient presents with  . post cpr    HPI [name] is a 72 y.o. male.  Pt is 72 y/o male with hx of COPD, CHF, cardiomyopathy EF 20-25% in 8/18, CAD, v-tach who is currently on hospice but changed code status several months ago to a full code presenting today by EMS after a witnessed arrest in the bathroom.  Pt's wife was present and called 911.  When fire arrived pt was agonal with palpable pulse.  When EMS arrived pt was in PEA and ROSC after 2 rounds of epi.  King airway placed and pt was transported here for further care.  The history is provided by the EMS personnel. The history is limited by the condition of the patient.    Past Medical History:  Diagnosis Date  . AICD (automatic cardioverter/defibrillator) present   . Allergic rhinitis, cause unspecified   . Anxiety   . Arthritis    "all over" (11/07/2015)  . Atrial fibrillation (HCC)    a. 05/2015 - converted to sinus in setting of ICD shocks; placed on eliquis 5 bid.  [name] CAD (coronary artery disease)   . Cervical herniated disc    told not to lift >10 lbs  . Chronic systolic CHF (congestive heart failure), NYHA class 2 (HCC)    Reports EF of 25%.   [name] COPD (chronic obstructive pulmonary disease) (HCC) 11/2012   by xray  . Diverticulosis    by CT scan  . HCAP (healthcare-associated pneumonia) 11/06/2015  . History of lower GI bleeding 12/11/2011   "first time" (12/11/2011)  . History of MI (myocardial infarction) 1995   Pt living in [place], no stent, ?PTCA  . Hypertension   . Insomnia   . Ischemic cardiomyopathy    Echo 8/18: EF 20-25, diffuse HK, grade 2 DD, trivial AI, severe central MR, severe LAE, moderately reduced RVSF, moderate RAE, PASP 63  . Perennial allergic rhinitis    only to dust mites  . Pneumonia 2000s   "walking pneumonia"   . VT (ventricular tachycardia) (HCC)    a. 05/2015 - VT storm with multiple ICD shocks-->Amio 400 BID.    Patient Active Problem List   Diagnosis Date Noted  . Humerus fracture 06/22/2017  . Decrease in appetite 06/22/2017  . Lightheaded 06/22/2017  . Pressure injury of skin 05/22/2017  . Palliative care encounter   . Neck pain 02/09/2017  . Perennial allergic rhinitis   . Hypertension   . Diverticulosis   . Chronic systolic CHF (congestive heart failure), NYHA class 2 (HCC)   . Cervical herniated disc   . CAD (coronary artery disease)   . Atrial fibrillation (HCC)   . AICD (automatic cardioverter/defibrillator) present   . Rib pain 11/17/2016  . DNR (do not resuscitate) 09/21/2016  . Iron deficiency anemia 08/15/2016  . RLS (restless legs syndrome) 08/12/2016  . ICD (implantable cardioverter-defibrillator) discharge 06/20/2016  . Atrial tachycardia (HCC) 06/20/2016  . Hyperkalemia 02/13/2016  . AKI (acute kidney injury) (HCC)   . Centrilobular emphysema (HCC)   . Postcholecystectomy diarrhea 12/12/2015  . Hypoalbuminemia due to protein-calorie malnutrition (HCC)   . Anemia of chronic disease   . Chronic systolic heart failure (HCC)   . Coronary artery disease involving native coronary artery of native heart without angina pectoris   . S/P implantation  of automatic cardioverter/defibrillator (AICD)   . Supplemental oxygen dependent   . Physical deconditioning 11/28/2015  . Transaminitis 11/28/2015  . Hypoxia   . Fall at home 10/11/2015  . VT (ventricular tachycardia) (HCC) 09/27/2015  . Memory loss 09/24/2015  . Encounter for screening examination for infectious disease 09/24/2015  . Persistent atrial fibrillation (HCC) 05/28/2015  . Syncope 05/12/2015  . Advance care planning 09/16/2014  . Muscle ache 09/16/2014  . Back pain 09/08/2013  . Fatigue 06/11/2013  . Chest wall mass 11/05/2012  . COPD (chronic obstructive pulmonary disease) (HCC) 11/04/2012  . Arthritis    . Insomnia   . Anxiety   . History of lower GI bleeding 12/11/2011  . Ischemic cardiomyopathy 02/12/2011  . Hyperlipidemia 08/25/2008  . Amiodarone pulmonary toxicity 08/25/2008  . History of MI (myocardial infarction) 02/04/1993    Past Surgical History:  Procedure Laterality Date  . CARDIAC CATHETERIZATION  2004   LAD 30%, D1 30%, CFX-AV groove 70-80%, OM1 30%, EF 20-25%  . CARDIAC CATHETERIZATION N/A 09/28/2015   Procedure: Left Heart Cath and Coronary Angiography;  Surgeon: Peter M Swaziland, MD;  Location: [facility] INVASIVE CV LAB;  Service: Cardiovascular;  Laterality: N/A;  . CARDIAC DEFIBRILLATOR PLACEMENT  2004  . CATARACT EXTRACTION W/ INTRAOCULAR LENS IMPLANT Right 01/2012  . COLONOSCOPY  01/08/2012   Procedure: COLONOSCOPY;  Surgeon: Iva Boop, MD;  Location: [facility] ENDOSCOPY;  Service: Endoscopy;  Laterality: N/A;  . CORONARY ANGIOPLASTY  1995   Pt thinks he got a balloon, living in [place], [place]  . IMPLANTABLE CARDIOVERTER DEFIBRILLATOR GENERATOR CHANGE N/A 02/07/2012   Procedure: IMPLANTABLE CARDIOVERTER DEFIBRILLATOR GENERATOR CHANGE;  Surgeon: Marinus Maw, MD; Medtronic Evera XT VR single-chamber serial number [id number], Laterality: Left  . INSERT / REPLACE / REMOVE PACEMAKER  2004   Medtronic ICD  . KNEE ARTHROSCOPY Left 05/2003   Hattie Perch 06/19/2010  . LAPAROSCOPIC CHOLECYSTECTOMY  1/ 2012  . SHOULDER ARTHROSCOPY W/ ROTATOR CUFF REPAIR Right twice  . TONSILLECTOMY AND ADENOIDECTOMY  ~ 1951  . V TACH ABLATION N/A 04/29/2016   Procedure: V Tach Ablation;  Surgeon: Marinus Maw, MD;  Location: [facility] INVASIVE CV LAB;  Service: Cardiovascular;  Laterality: N/A;        Home Medications    Prior to Admission medications   Medication Sig Start Date End Date Taking? Authorizing Provider  acetaminophen (TYLENOL) 325 MG tablet Take 2 tablets (650 mg total) by mouth every 6 (six) hours as needed for mild pain (or Fever >/= 101). 12/06/15   Joseph Art, DO  amiodarone  (PACERONE) 200 MG tablet Take 1 tablet (200 mg total) by mouth 2 (two) times daily. 05/22/17   Sheilah Pigeon, PA-C  benzonatate (TESSALON) 200 MG capsule TAKE 1 CAPSULE BY MOUTH 3 TIMES DAILY AS NEEDED FOR COUGH 12/10/16   Dianne Dun, MD  Calcium Carb-Cholecalciferol (CALCIUM 500 +D) 500-400 MG-UNIT TABS Take 1 tablet by mouth daily.    [provider]  Chlorpheniramine-APAP (CORICIDIN) 2-325 MG TABS Take 2 tablets by mouth daily as needed (congestion/runny nose).    [provider]  ELIQUIS 2.5 MG TABS tablet TAKE 1 TABLET (2.5 MG TOTAL) BY MOUTH 2 (TWO) TIMES DAILY. 04/11/17   Marinus Maw, MD  ferrous sulfate (FERROUSUL) 325 (65 FE) MG tablet Take 1 tablet (325 mg total) by mouth daily with breakfast. 10/11/16   Joaquim Nam, MD  furosemide (LASIX) 40 MG tablet Take 0.5 tablets (20 mg total) by mouth daily.  06/13/17 09/11/17  Marinus Maw, MD  Guaifenesin [facility] MAXIMUM STRENGTH) 1200 MG TB12 Take 1,200 mg by mouth 2 (two) times daily.    [provider]  levalbuterol Pauline Aus) 0.63 MG/3ML nebulizer solution Take 0.63 mg by nebulization every 4 (four) hours as needed for wheezing or shortness of breath.    [provider]  loperamide (IMODIUM) 2 MG capsule Take 1 capsule (2 mg total) by mouth See admin instructions. Take 1 capsule (2 mg) by mouth after each bowel movement Patient taking differently: Take 2 mg by mouth See admin instructions. Take 1 capsule (2 mg) by mouth after each bowel movement as needed for diarrhea 09/21/16   Tereso Newcomer T, PA-C  metaxalone (SKELAXIN) 800 MG tablet TAKE 0.5-1 TABLET BY MOUTH 3 TIMES A DAY AS NEEDED FOR MUSCLE SPASMS. 06/26/17   Joaquim Nam, MD  mexiletine (MEXITIL) 150 MG capsule Take 2 capsules (300 mg total) by mouth every 8 (eight) hours. 8am, 3:30pm, 11:30pm 08/27/16   Marinus Maw, MD  nitroGLYCERIN (NITROSTAT) 0.4 MG SL tablet Place 1 tablet (0.4 mg total) under the tongue every 5 (five) minutes as  needed for chest pain. 02/18/17   Marinus Maw, MD  oxyCODONE (OXY IR/ROXICODONE) 5 MG immediate release tablet Take 5 mg by mouth every 8 (eight) hours as needed for moderate pain.  05/14/17   [provider]  OXYGEN Inhale 4 L into the lungs continuous.     [provider]  promethazine (PHENERGAN) 25 MG tablet Take 25 mg by mouth every 6 (six) hours as needed for nausea or vomiting.    [provider]  RANEXA 500 MG 12 hr tablet TAKE 1 TABLET (500 MG TOTAL) BY MOUTH 2 (TWO) TIMES DAILY. 04/08/17   Marinus Maw, MD  ranitidine (ZANTAC) 150 MG capsule Take 150 mg by mouth daily as needed for heartburn.     [provider]  sacubitril-valsartan (ENTRESTO) 24-26 MG Take 1 tablet by mouth 2 (two) times daily. 02/18/17   Marinus Maw, MD  Simethicone (GAS-X PO) Take 1-2 capsules by mouth daily as needed (pressure/bloating/gas). As needed.     [provider]  vitamin C (ASCORBIC ACID) 500 MG tablet Take 500 mg by mouth daily.    [provider]  Vitamin D, Ergocalciferol, 2000 units CAPS Take 2,000 Units by mouth daily.    [provider]  zolpidem (AMBIEN) 10 MG tablet TAKE 1/2 TO 1 TABLET BY MOUTH AT BEDTIME AS NEEDED FOR SLEEP Patient taking differently: TAKE 5 mg  TO 10 mg  TABLET BY MOUTH AT BEDTIME AS NEEDED FOR SLEEP 03/19/17   Joaquim Nam, MD    Family History Family History  Problem Relation Age of Onset  . Diabetes Father   . Tracheal cancer Father 68       smoker  . Stroke Mother   . Cancer Sister        left eye  . CAD Neg Hx   . Colon cancer Neg Hx   . Prostate cancer Neg Hx     Social History Social History   Tobacco Use  . Smoking status: Former Smoker    Packs/day: 0.50    Years: 50.00    Pack years: 25.00    Types: Cigarettes, Cigars    Last attempt to quit: 09/27/2015    Years since quitting: 1.7  . Smokeless tobacco: Never Used  Substance Use Topics  . Alcohol use: No  Alcohol/week: 0.0  oz  . Drug use: No     Allergies   Ace inhibitors; Codeine; Doxycycline; Kionex [sodium polystyrene sulfonate]; Penicillins; Lisinopril; Statins; Hydrocodone; Lidocaine; Pacerone [amiodarone]; and Xanax [alprazolam]   Review of Systems Review of Systems  Unable to perform ROS: Acuity of condition     Physical Exam Updated Vital Signs BP 117/71   Pulse (!) 114   Temp (!) 96.3 F (35.7 C)   Resp (!) 27   Ht 5\' 3"  (1.6 m)   Wt 62.1 kg (137 lb)   SpO2 (!) 60%   BMI 24.27 kg/m   Physical Exam  Constitutional: He appears well-developed. He appears toxic. No distress. He is intubated. Cervical collar and backboard in place.  HENT:  Head: Normocephalic and atraumatic.  Mouth/Throat: Oropharynx is clear and moist.  Mild pallor and cyanosis of the faced and lips  Eyes: Pupils are equal, round, and reactive to light. Conjunctivae and EOM are normal.  Neck: Normal range of motion. Neck supple.  Cardiovascular: Normal rate and regular rhythm.  Pulmonary/Chest: He is intubated. He has wheezes. He has rales.  Breathing some over the vent  Abdominal: Soft. He exhibits no distension. There is no tenderness. There is no rebound and no guarding.  Musculoskeletal: Normal range of motion. He exhibits no edema or tenderness.  Poor profusion with cyanosis of toes and fingers  Neurological: He is unresponsive.  S/p arrest and not currently reactive to stimuli.  Skin: Skin is warm and dry. No rash noted. No erythema.  Psychiatric:  Not responsive  Nursing note and vitals reviewed.    ED Treatments / Results  Labs (all labs ordered are listed, but only abnormal results are displayed) Labs Reviewed  CBG MONITORING, ED - Abnormal; Notable for the following components:      Result Value   Glucose-Capillary 133 (*)    All other components within normal limits  I-STAT CG4 LACTIC ACID, ED - Abnormal; Notable for the following components:   Lactic Acid, Venous 8.75 (*)    All other  components within normal limits  CULTURE, RESPIRATORY (NON-EXPECTORATED)  CULTURE, BLOOD (ROUTINE X 2)  CULTURE, BLOOD (ROUTINE X 2)  LACTIC ACID, PLASMA  LACTIC ACID, PLASMA  PROCALCITONIN  BASIC METABOLIC PANEL  MAGNESIUM  PHOSPHORUS  TROPONIN I  TROPONIN I  TROPONIN I  RAPID URINE DRUG SCREEN, HOSP PERFORMED  URINALYSIS, ROUTINE W REFLEX MICROSCOPIC  BLOOD GAS, ARTERIAL  CBC WITH DIFFERENTIAL/PLATELET  PROCALCITONIN  I-STAT TROPONIN, ED    EKG EKG Interpretation  Date/Time:  Friday 07-28-17 16:38:57 EDT Ventricular Rate:  77 PR Interval:    QRS Duration: 234 QT Interval:  443 QTC Calculation: 502 R Axis:   126 Text Interpretation:  Sinus or ectopic atrial rhythm Right bundle branch block Baseline wander in lead(s) V6 No significant change since last tracing Confirmed by Gwyneth Sprout ([id number]) on 2017/07/28 4:59:24 PM   Radiology No results found.  Procedures Procedures (including critical care time)  Medications Ordered in ED Medications - No data to display   Initial Impression / Assessment and Plan / ED Course  I have reviewed the triage vital signs and the nursing notes.  Pertinent labs & imaging results that were available during my care of the patient were reviewed by me and considered in my medical decision making (see chart for details).     Pt is an elderly gentleman with witnessed arrest at home with PEA that responded to epi.  On arrival here pt  lost pulses and was given epi with return of pulse and he was started on epi gtt.  BS 130 and BP 117/70.  Pt is breathing some over the vent.  Pt does not wake up or respond to stimuli.  Imaging and blood work pending.  Pt with hx of severe cardiomyopathy with hx of dysrhythmia and EF of 20-25%.  Pt on eliquis and low suspicion for PE.  Concern for worsening heart failure and dysrhythmia for cause of his demise.  Dr. Molli Knock with critical care discussed with family who wants to make pt DNR and comfort  measures only.  Pt was given morphine and made comfort care.  7:30 PM Just spoke with family.  They had not cleared him for extubation and after speaking with the family and discussing that the ventilator is the only thing keeping him alive and that he could be suffering the decision was made to extubate the patient.  He continues to get morphine for air hunger and pain control.  7:45 PM Pt passed at 7:35pm.  Death Certificate will be sent to Dr. Crawford Givens the pt's PCP.  CRITICAL CARE Performed by: Kennede Lusk Total critical care time: 45 minutes Critical care time was exclusive of separately billable procedures and treating other patients. Critical care was necessary to treat or prevent imminent or life-threatening deterioration. Critical care was time spent personally by me on the following activities: development of treatment plan with patient and/or surrogate as well as nursing, discussions with consultants, evaluation of patient's response to treatment, examination of patient, obtaining history from patient or surrogate, ordering and performing treatments and interventions, ordering and review of laboratory studies, ordering and review of radiographic studies, pulse oximetry and re-evaluation of patient's condition.  Final Clinical Impressions(s) / ED Diagnoses   Final diagnoses:  Cardiac arrest [facility])  Respiratory arrest [facility])    ED Discharge Orders    None       Gwyneth Sprout, MD 07/25/17 1946

## 2017-07-05 NOTE — ED Triage Notes (Signed)
Pt arrived post cpr  Witnessed arfrest  Hospice pt full code  Pulses when ems arrived   At the house.  On arrival no pulse cpr copntinued.  Epi bristojet 1635  1636 n calcium chloride bristojet    1638 sod bicarb bristojet    1643 epine birstojet    1839 pulse present    1645 epine drip started at 5mg /kg   ?? Poss nsr  Wide qrs no p-wave prfesentg    1646  Pt indtubated replacing the king airway.   Samuel Bouche removed from the pt

## 2017-07-05 NOTE — ED Notes (Signed)
[place] donor Harlene Salts called back  Wants to be called whenever we have a tie of death

## 2017-07-05 NOTE — Progress Notes (Signed)
CSW went to pt's room to provide comfort care. Chaplain at bedside with pt's wife. CSW will continue to follow for additional comfort care needs.   Montine Circle, [facility]  [phone]

## 2017-07-05 NOTE — ED Notes (Signed)
The pts family is not  Ready to have the  Et tube removed

## 2017-07-05 NOTE — ED Notes (Signed)
Family at the bedside  bp cuff removed monitor only on comfort care morphine drip going comfort care on the monitor only

## 2017-07-05 NOTE — Progress Notes (Signed)
Remainder of morphine drip wasted in sink. Witness Lysle Pearl, PharmD.

## 2017-07-05 NOTE — ED Notes (Signed)
p 40 pt has stopped breathing

## 2017-07-05 NOTE — ED Notes (Signed)
p 43

## 2017-07-05 NOTE — ED Notes (Signed)
Arrived 1629 post cpr

## 2017-07-05 NOTE — ED Notes (Signed)
No there meds except morphine for comfort

## 2017-07-05 NOTE — ED Notes (Signed)
Dr Anitra Lauth asked for rate increase

## 2017-07-05 NOTE — ED Notes (Signed)
Cc care has spoken to the family  No further efforts  Except with what has occurred up to now

## 2017-07-05 NOTE — ED Notes (Signed)
Heart rate is slowing  Cc at the bedside

## 2017-07-05 NOTE — Consult Note (Signed)
PULMONARY / CRITICAL CARE MEDICINE   Name: [name] MRN: [id number] DOB: 06/07/1945    ADMISSION DATE:  18-Jul-2017 CONSULTATION DATE:  18-Jul-2017  REFERRING MD:  EDP - Plunkett  CHIEF COMPLAINT:  Cardiac arrest and respiratory failure  HISTORY OF PRESENT ILLNESS:   72 year old male with extensive PMH who presents to the hospital after collapsing infront of his wife at home.  Was prior complaining of SOB and CP prior to collapsing.  EMS was called and patient had a pulse on presentation which he promptly lost it.  CPR was started.  Patient was brought to the ER where ROSC was established but with an epi drip.  Prior patient was on hospice for cardiomyopathy and COPD.  PCCM was called to admit.  Family arrived.  They were informed of current condition.  After discussion decision was made to make the patient a full DNR, start morphine for comfort and extubate.  Will not admit as anticipate will expire in the ER.  Discussed with the EDP and rest to be handled by EDP and ER staff.  PCCM will sign off.  PAST MEDICAL HISTORY :  He  has a past medical history of AICD (automatic cardioverter/defibrillator) present, Allergic rhinitis, cause unspecified, Anxiety, Arthritis, Atrial fibrillation (HCC), CAD (coronary artery disease), Cervical herniated disc, Chronic systolic CHF (congestive heart failure), NYHA class 2 (HCC), COPD (chronic obstructive pulmonary disease) (HCC) (11/2012), Diverticulosis, HCAP (healthcare-associated pneumonia) (11/06/2015), History of lower GI bleeding (12/11/2011), History of MI (myocardial infarction) (1995), Hypertension, Insomnia, Ischemic cardiomyopathy, Perennial allergic rhinitis, Pneumonia (2000s), and VT (ventricular tachycardia) (HCC).  PAST SURGICAL HISTORY: He  has a past surgical history that includes Cardiac defibrillator placement (2004); Tonsillectomy and adenoidectomy (~ 1951); Shoulder arthroscopy w/ rotator cuff repair (Right, twice); Insert / replace /  remove pacemaker (2004); Colonoscopy (01/08/2012); implantable cardioverter defibrillator generator change (N/A, 02/07/2012); Laparoscopic cholecystectomy (1/ 2012); Coronary angioplasty (1995); Cardiac catheterization (2004); Cardiac catheterization (N/A, 09/28/2015); Cataract extraction w/ intraocular lens implant (Right, 01/2012); Knee arthroscopy (Left, 05/2003); and V TACH ABLATION (N/A, 04/29/2016).  Allergies  Allergen Reactions  . Ace Inhibitors Other (See Comments)    muscle pain. Tolerates ARBs.   . Codeine Other (See Comments)    "head wants to explode."  . Doxycycline Diarrhea and Nausea And Vomiting  . Kionex [Sodium Polystyrene Sulfonate] Other (See Comments)    SOB, pressure in chest, weakness fatigue.   [name] Penicillins Swelling    "started at point of injection; w/in 3 min my upper arm was swollen 3 times normal" Has patient had a PCN reaction causing immediate rash, facial/tongue/throat swelling, SOB or lightheadedness with hypotension: Yes Has patient had a PCN reaction causing severe rash involving mucus membranes or skin necrosis: No Has patient had a PCN reaction that required hospitalization No Has patient had a PCN reaction occurring within the last 10 years: No If all of the above answers are "NO", then may proceed wi  . Lisinopril Other (See Comments)    Muscle Pain  . Statins Other (See Comments)    Myalgias per patient  . Hydrocodone Other (See Comments)    Severe headache, esp when combined with skelaxin.    . Lidocaine     Hallucinations, jerking  . Pacerone [Amiodarone] Other (See Comments)  . Xanax [Alprazolam] Other (See Comments)    Nightmares.      No current facility-administered medications on file prior to encounter.    Current Outpatient Medications on File Prior to Encounter  Medication Sig  .  acetaminophen (TYLENOL) 325 MG tablet Take 2 tablets (650 mg total) by mouth every 6 (six) hours as needed for mild pain (or Fever >/= 101).  [name] amiodarone  (PACERONE) 200 MG tablet Take 1 tablet (200 mg total) by mouth 2 (two) times daily.  . benzonatate (TESSALON) 200 MG capsule TAKE 1 CAPSULE BY MOUTH 3 TIMES DAILY AS NEEDED FOR COUGH  . Calcium Carb-Cholecalciferol (CALCIUM 500 +D) 500-400 MG-UNIT TABS Take 1 tablet by mouth daily.  . Chlorpheniramine-APAP (CORICIDIN) 2-325 MG TABS Take 2 tablets by mouth daily as needed (congestion/runny nose).  [name] ELIQUIS 2.5 MG TABS tablet TAKE 1 TABLET (2.5 MG TOTAL) BY MOUTH 2 (TWO) TIMES DAILY.  . ferrous sulfate (FERROUSUL) 325 (65 FE) MG tablet Take 1 tablet (325 mg total) by mouth daily with breakfast.  . furosemide (LASIX) 40 MG tablet Take 0.5 tablets (20 mg total) by mouth daily.  . Guaifenesin (MUCINEX MAXIMUM STRENGTH) 1200 MG TB12 Take 1,200 mg by mouth 2 (two) times daily.  [name] levalbuterol (XOPENEX) 0.63 MG/3ML nebulizer solution Take 0.63 mg by nebulization every 4 (four) hours as needed for wheezing or shortness of breath.  . loperamide (IMODIUM) 2 MG capsule Take 1 capsule (2 mg total) by mouth See admin instructions. Take 1 capsule (2 mg) by mouth after each bowel movement (Patient taking differently: Take 2 mg by mouth See admin instructions. Take 1 capsule (2 mg) by mouth after each bowel movement as needed for diarrhea)  . metaxalone (SKELAXIN) 800 MG tablet TAKE 0.5-1 TABLET BY MOUTH 3 TIMES A DAY AS NEEDED FOR MUSCLE SPASMS.  [name] mexiletine (MEXITIL) 150 MG capsule Take 2 capsules (300 mg total) by mouth every 8 (eight) hours. 8am, 3:30pm, 11:30pm  . nitroGLYCERIN (NITROSTAT) 0.4 MG SL tablet Place 1 tablet (0.4 mg total) under the tongue every 5 (five) minutes as needed for chest pain.  [name] oxyCODONE (OXY IR/ROXICODONE) 5 MG immediate release tablet Take 5 mg by mouth every 8 (eight) hours as needed for moderate pain.   . OXYGEN Inhale 4 L into the lungs continuous.   . promethazine (PHENERGAN) 25 MG tablet Take 25 mg by mouth every 6 (six) hours as needed for nausea or vomiting.  [name] RANEXA 500 MG  12 hr tablet TAKE 1 TABLET (500 MG TOTAL) BY MOUTH 2 (TWO) TIMES DAILY.  . ranitidine (ZANTAC) 150 MG capsule Take 150 mg by mouth daily as needed for heartburn.   . sacubitril-valsartan (ENTRESTO) 24-26 MG Take 1 tablet by mouth 2 (two) times daily.  . Simethicone (GAS-X PO) Take 1-2 capsules by mouth daily as needed (pressure/bloating/gas). As needed.   . vitamin C (ASCORBIC ACID) 500 MG tablet Take 500 mg by mouth daily.  . Vitamin D, Ergocalciferol, 2000 units CAPS Take 2,000 Units by mouth daily.  [name] zolpidem (AMBIEN) 10 MG tablet TAKE 1/2 TO 1 TABLET BY MOUTH AT BEDTIME AS NEEDED FOR SLEEP (Patient taking differently: TAKE 5 mg  TO 10 mg  TABLET BY MOUTH AT BEDTIME AS NEEDED FOR SLEEP)    FAMILY HISTORY:  His indicated that his mother is deceased. He indicated that his father is deceased. He indicated that his sister is alive. He indicated that the status of his neg hx is unknown.   SOCIAL HISTORY: He  reports that he quit smoking about 21 months ago. His smoking use included cigarettes and cigars. He has a 25.00 pack-year smoking history. He has never used smokeless tobacco. He reports that he does not drink alcohol  or use drugs.  REVIEW OF SYSTEMS:   Unable to attain  SUBJECTIVE:  Unresponsive  VITAL SIGNS: BP 94/66   Pulse (!) 57   Temp (!) 96.3 F (35.7 C)   Resp (!) 22   Ht 5\' 3"  (1.6 m)   Wt 137 lb (62.1 kg)   SpO2 (!) 83%   BMI 24.27 kg/m   HEMODYNAMICS:    VENTILATOR SETTINGS:    INTAKE / OUTPUT: No intake/output data recorded.  PHYSICAL EXAMINATION: General:  Acute on chronically ill elderly male, NAD Neuro:  Unresponsive without any sedation, breathing over the vent but not withdrawing anything for pain HEENT:  [place]/AT, pupils are fixed and dilated. Cardiovascular:  Regular, paced, on epi drip, Nl S1/S2 and -M/R/G Lungs:  Coarse BS diffusely Abdomen:  Soft, NT, ND and +BS Musculoskeletal:  -edema and -tenderness Skin:  Intact  LABS:  BMET No  results for input(s): NA, K, CL, CO2, BUN, CREATININE, GLUCOSE in the last 168 hours.  Electrolytes No results for input(s): CALCIUM, MG, PHOS in the last 168 hours.  CBC No results for input(s): WBC, HGB, HCT, PLT in the last 168 hours.  Coag's No results for input(s): APTT, INR in the last 168 hours.  Sepsis Markers Recent Labs  Lab 07/06/2017 1656  LATICACIDVEN 8.75*    ABG No results for input(s): PHART, PCO2ART, PO2ART in the last 168 hours.  Liver Enzymes No results for input(s): AST, ALT, ALKPHOS, BILITOT, ALBUMIN in the last 168 hours.  Cardiac Enzymes No results for input(s): TROPONINI, PROBNP in the last 168 hours.  Glucose Recent Labs  Lab 2017/07/06 1639  GLUCAP 133*    Imaging No results found.   STUDIES:  None  CULTURES: None  ANTIBIOTICS: None  SIGNIFICANT EVENTS: Cardiac arrest 5-31.  LINES/TUBES: ETT 5/31>>>5/31  DISCUSSION: 72 year old male with extensive PMH who presents to the hospital after collapsing infront of his wife at home.  Was prior complaining of SOB and CP prior to collapsing.  EMS was called and patient had a pulse on presentation which he promptly lost it.  CPR was started.  Patient was brought to the ER where ROSC was established but with an epi drip.  Prior patient was on hospice for cardiomyopathy and COPD.  PCCM was called to admit.  Family arrived.  They were informed of current condition.  After discussion decision was made to make the patient a full DNR, start morphine for comfort and extubate.  Will not admit as anticipate will expire in the ER.  Discussed with the EDP and rest to be handled by EDP and ER staff.  Withdrawal orderset placed and extubation order placed for when patient is comfortable and family is ready.  PCCM will sign off.  The patient is critically ill with multiple organ systems failure and requires high complexity decision making for assessment and support, frequent evaluation and titration of therapies,  application of advanced monitoring technologies and extensive interpretation of multiple databases.   Critical Care Time devoted to patient care services described in this note is  45  Minutes. This time reflects time of care of this signee Dr Koren Bound. This critical care time does not reflect procedure time, or teaching time or supervisory time of PA/NP/Med student/Med Resident etc but could involve care discussion time.  Alyson Reedy, M.D. [facility] Pulmonary/Critical Care Medicine. Pager: [phone]. After hours pager: [phone].  07-06-17, 5:17 PM

## 2017-07-05 NOTE — ED Notes (Signed)
[place] donor contacted    They want [place] to hold off extubating the pt for possible donor  Donor number [id number] david johnson cordinator to call

## 2017-07-05 NOTE — ED Notes (Signed)
Pt arrived 1629  If charting is not suffucient  I was the only nurse attending for 30 minutes  Unable to chart and task at the same time

## 2017-07-05 NOTE — ED Notes (Signed)
Dr Anitra Lauth at the bedside  resp therapy removed the et tube  Family at the bedside

## 2017-07-05 NOTE — Progress Notes (Signed)
   25-Jul-2017 2038  Clinical Encounter Type  Visited With Patient and family together;Health care provider  Visit Type ED;Patient actively dying;Code;Death  Referral From Nurse;Chaplain  Consult/Referral To Chaplain  Spiritual Encounters  Spiritual Needs Prayer;Emotional;Grief support  Stress Factors  Family Stress Factors Loss   Upon arriving for my shift I took over this patient and family from [place].  Supported the family as the Physician discussed the prognosis.  Escorted the family to the bedside and continued to support the family as they arrived.  Wife, Hilda Lias shared stories about their 21 years of marriage, Hilda Lias lost her first husband around 34 so for the family this was a big trigger bringing up the past.  Prayed with the family.  Remained with them until they were ready to leave.  Obtained the necessary information and passed that on. Chaplain Agustin Cree

## 2017-07-05 NOTE — ED Notes (Signed)
pts heart beat is  49 no change in  Configuration still on the vent  Morphine  Increased to 15g

## 2017-07-05 NOTE — ED Notes (Signed)
Called lactic acid 8.75 in to Dr. Anitra Lauth

## 2017-07-05 NOTE — Progress Notes (Signed)
Terminal extubation completed. Family, RN and Chaplain at bedside.

## 2017-07-05 NOTE — ED Notes (Signed)
Second call to [place] donor service initially the call was picked up by soeone who could not find our hsop in their syste.  Second person that picked up call gave a different  Than previoously  The number given this last time was [phone]  I was told that tissue and eye donation was possible no next of kin was asked for no telephone number asked for

## 2017-07-05 NOTE — ED Notes (Signed)
Family still with the pt

## 2017-07-05 NOTE — ED Notes (Signed)
Wide qrs no p-wave

## 2017-07-05 NOTE — ED Notes (Signed)
Family at the bedside.

## 2017-07-05 NOTE — ED Notes (Signed)
The family is undecided about taking him off the vent

## 2017-07-05 NOTE — ED Provider Notes (Signed)
INTUBATION Performed by: Caren Griffins  Required items: required blood products, implants, devices, and special equipment available Patient identity confirmed: provided demographic data and hospital-assigned identification number Time out: Immediately prior to procedure a "time out" was called to verify the correct patient, procedure, equipment, support staff and site/side marked as required.  Indications: Respiratory failure, airway protection  Intubation method: Glidescope Laryngoscopy   Preoxygenation: LMA  Sedatives: None Paralytic: None  Tube Size: 7.5 cuffed 21cm at lips  Post-procedure assessment: chest rise and ETCO2 monitor Breath sounds: equal and absent over the epigastrium Tube secured with: ETT holder Chest x-ray interpreted by radiologist and me.  Chest x-ray findings: endotracheal tube in appropriate position  Patient tolerated the procedure well with no immediate complications.      Caren Griffins, MD 07/13/17 3846    Cathren Laine, MD 2017-07-13 [phone]

## 2017-07-05 NOTE — ED Notes (Signed)
pts epi drip increased to 10mg /kg

## 2017-07-05 NOTE — ED Notes (Signed)
epine drip turned off by cc doctor

## 2017-07-05 NOTE — ED Notes (Signed)
Pt still has the same configuration  On monitor   sb at present  2n d dose of morphiune 4mg  given waiting for the drip

## 2017-07-05 NOTE — ED Notes (Signed)
Pronounced at 1935 by dr Anitra Lauth

## 2017-07-05 NOTE — ED Notes (Signed)
Morphine drip started at 10mg  per order  Wife and family at the bedside

## 2017-07-05 DEATH — deceased

## 2017-07-07 ENCOUNTER — Telehealth: Payer: Self-pay

## 2017-07-07 NOTE — Telephone Encounter (Signed)
PLEASE NOTE: All timestamps contained within this report are represented as [place] Standard Time. CONFIDENTIALTY NOTICE: This fax transmission is intended only for the addressee. It contains information that is legally privileged, confidential or otherwise protected from use or disclosure. If you are not the intended recipient, you are strictly prohibited from reviewing, disclosing, copying using or disseminating any of this information or taking any action in reliance on or regarding this information. If you have received this fax in error, please notify us immediately by telephone so that we can arrange for its return to [place]. Phone: [phone], Toll-Free: [phone], Fax: [phone] Page: 1 of 1 Call Id: [id number] [place] [facility] Night - Client Nonclinical Telephone Record [facility] Medical Call [facility] Client [place] [facility] Night - Client Client Site [facility] [place] - Night Physician AA - PHYSICIAN, NOT LISTED- MD Contact Type Call Who Is Calling Physician / Provider / Hospital Call Type Provider Call [facility] Page Now Reason for Call Request to speak to Physician Initial Comment Caller from [facility] ER needs to speak to on call about Pt that has passed away Dr. Ronita Hipps Additional Comment Patient Name [name] Patient DOB 1945/12/[address] Provider Dr. Eyvonne Left Physician Number [phone] Facility Name [facility] Paging DoctorName Phone DateTime Result/Outcome Message Type Notes Gershon Crane - MD [phone] 07-18-2017 8:03:29 PM Paged On Call to Other Provider Doctor Paged Msg from [facility] w/Team [facility]. Pls call Dr. Eyvonne Left w/[place] [facility] @ [phone]. Gershon Crane - MD July 18, 2017 8:03:38 PM Paged On Call to Another Provider Message Result Call Closed By: Macario Carls Transaction Date/Time: 07/18/17 7:53:13 PM (ET)

## 2017-07-13 ENCOUNTER — Telehealth: Payer: Self-pay | Admitting: Family Medicine

## 2017-07-13 NOTE — Telephone Encounter (Signed)
Called and left message on voicemail at patient's phone number and also on wife's cell phone.  I called to offer condolences to his wife.  I was glad to see this man in the clinic.  He was a kind gentleman and he will be missed.

## 2017-07-16 ENCOUNTER — Telehealth: Payer: Self-pay

## 2017-07-16 NOTE — Telephone Encounter (Signed)
Returned call to wife.   No further action needed.

## 2017-09-02 ENCOUNTER — Encounter: Payer: Federal, State, Local not specified - PPO | Admitting: Internal Medicine

## 2017-11-20 ENCOUNTER — Telehealth: Payer: Self-pay | Admitting: Internal Medicine

## 2017-11-20 NOTE — Telephone Encounter (Signed)
New Message   [facility] or an old copy. Please call

## 2017-11-28 NOTE — Telephone Encounter (Signed)
Refaxed requested document to [facility]. No further action needed
